# Patient Record
Sex: Male | Born: 1950 | Race: White | Hispanic: No | Marital: Married | Smoking: Former smoker
Health system: Southern US, Community
[De-identification: ages and names within clinical notes are randomized; demographics above are authoritative.]

## PROBLEM LIST (undated history)

## (undated) DIAGNOSIS — E785 Hyperlipidemia, unspecified: Secondary | ICD-10-CM

## (undated) DIAGNOSIS — I639 Cerebral infarction, unspecified: Secondary | ICD-10-CM

## (undated) DIAGNOSIS — N186 End stage renal disease: Secondary | ICD-10-CM

## (undated) DIAGNOSIS — I214 Non-ST elevation (NSTEMI) myocardial infarction: Secondary | ICD-10-CM

## (undated) DIAGNOSIS — I509 Heart failure, unspecified: Secondary | ICD-10-CM

## (undated) DIAGNOSIS — H409 Unspecified glaucoma: Secondary | ICD-10-CM

## (undated) DIAGNOSIS — M2041 Other hammer toe(s) (acquired), right foot: Secondary | ICD-10-CM

## (undated) DIAGNOSIS — E0842 Diabetes mellitus due to underlying condition with diabetic polyneuropathy: Secondary | ICD-10-CM

## (undated) DIAGNOSIS — D649 Anemia, unspecified: Secondary | ICD-10-CM

## (undated) DIAGNOSIS — E781 Pure hyperglyceridemia: Secondary | ICD-10-CM

## (undated) DIAGNOSIS — B351 Tinea unguium: Secondary | ICD-10-CM

## (undated) DIAGNOSIS — M109 Gout, unspecified: Secondary | ICD-10-CM

## (undated) DIAGNOSIS — E119 Type 2 diabetes mellitus without complications: Secondary | ICD-10-CM

## (undated) DIAGNOSIS — Z8679 Personal history of other diseases of the circulatory system: Secondary | ICD-10-CM

## (undated) DIAGNOSIS — M1990 Unspecified osteoarthritis, unspecified site: Secondary | ICD-10-CM

## (undated) DIAGNOSIS — M2042 Other hammer toe(s) (acquired), left foot: Secondary | ICD-10-CM

## (undated) DIAGNOSIS — I110 Hypertensive heart disease with heart failure: Secondary | ICD-10-CM

## (undated) DIAGNOSIS — Z992 Dependence on renal dialysis: Secondary | ICD-10-CM

## (undated) DIAGNOSIS — M545 Low back pain: Secondary | ICD-10-CM

## (undated) DIAGNOSIS — K219 Gastro-esophageal reflux disease without esophagitis: Secondary | ICD-10-CM

## (undated) DIAGNOSIS — R0600 Dyspnea, unspecified: Secondary | ICD-10-CM

## (undated) DIAGNOSIS — I739 Peripheral vascular disease, unspecified: Secondary | ICD-10-CM

## (undated) DIAGNOSIS — G4730 Sleep apnea, unspecified: Secondary | ICD-10-CM

## (undated) DIAGNOSIS — F329 Major depressive disorder, single episode, unspecified: Secondary | ICD-10-CM

## (undated) DIAGNOSIS — I10 Essential (primary) hypertension: Secondary | ICD-10-CM

## (undated) DIAGNOSIS — F411 Generalized anxiety disorder: Secondary | ICD-10-CM

## (undated) DIAGNOSIS — I219 Acute myocardial infarction, unspecified: Secondary | ICD-10-CM

## (undated) DIAGNOSIS — Z9289 Personal history of other medical treatment: Secondary | ICD-10-CM

## (undated) HISTORY — DX: Type 2 diabetes mellitus without complications: E11.9

## (undated) HISTORY — DX: Unspecified osteoarthritis, unspecified site: M19.90

## (undated) HISTORY — DX: Hypertensive heart disease with heart failure: I11.0

## (undated) HISTORY — DX: Other hammer toe(s) (acquired), left foot: M20.42

## (undated) HISTORY — DX: Gout, unspecified: M10.9

## (undated) HISTORY — DX: Cerebral infarction, unspecified: I63.9

## (undated) HISTORY — PX: CATARACT EXTRACTION W/ INTRAOCULAR LENS  IMPLANT, BILATERAL: SHX1307

## (undated) HISTORY — DX: Low back pain: M54.5

## (undated) HISTORY — PX: KNEE SURGERY: SHX244

## (undated) HISTORY — PX: CARDIAC CATHETERIZATION: SHX172

## (undated) HISTORY — PX: CORONARY ANGIOPLASTY: SHX604

## (undated) HISTORY — DX: Anemia, unspecified: D64.9

## (undated) HISTORY — PX: COLONOSCOPY W/ POLYPECTOMY: SHX1380

## (undated) HISTORY — DX: Gastro-esophageal reflux disease without esophagitis: K21.9

## (undated) HISTORY — DX: Tinea unguium: B35.1

## (undated) HISTORY — DX: Heart failure, unspecified: I50.9

## (undated) HISTORY — DX: Diabetes mellitus due to underlying condition with diabetic polyneuropathy: E08.42

## (undated) HISTORY — DX: Acute myocardial infarction, unspecified: I21.9

## (undated) HISTORY — DX: Hyperlipidemia, unspecified: E78.5

## (undated) HISTORY — DX: Peripheral vascular disease, unspecified: I73.9

## (undated) HISTORY — DX: Unspecified glaucoma: H40.9

## (undated) HISTORY — DX: Personal history of other diseases of the circulatory system: Z86.79

## (undated) HISTORY — DX: Other hammer toe(s) (acquired), right foot: M20.41

## (undated) HISTORY — PX: OTHER SURGICAL HISTORY: SHX169

## (undated) HISTORY — DX: Pure hyperglyceridemia: E78.1

## (undated) HISTORY — DX: Essential (primary) hypertension: I10

## (undated) HISTORY — PX: INSERTION OF DIALYSIS CATHETER: SHX1324

## (undated) HISTORY — DX: Major depressive disorder, single episode, unspecified: F32.9

## (undated) HISTORY — PX: CAROTID STENT: SHX1301

## (undated) HISTORY — DX: Non-ST elevation (NSTEMI) myocardial infarction: I21.4

## (undated) HISTORY — DX: Generalized anxiety disorder: F41.1

---

## 1995-04-25 DIAGNOSIS — I219 Acute myocardial infarction, unspecified: Secondary | ICD-10-CM | POA: Insufficient documentation

## 1995-04-25 HISTORY — DX: Acute myocardial infarction, unspecified: I21.9

## 2000-03-28 ENCOUNTER — Ambulatory Visit (HOSPITAL_BASED_OUTPATIENT_CLINIC_OR_DEPARTMENT_OTHER): Admission: RE | Admit: 2000-03-28 | Discharge: 2000-03-28 | Payer: Self-pay | Admitting: Orthopedic Surgery

## 2003-04-21 ENCOUNTER — Ambulatory Visit (HOSPITAL_COMMUNITY): Admission: RE | Admit: 2003-04-21 | Discharge: 2003-04-22 | Payer: Self-pay | Admitting: Cardiology

## 2007-09-12 ENCOUNTER — Encounter: Payer: Self-pay | Admitting: Endocrinology

## 2008-04-28 ENCOUNTER — Encounter: Payer: Self-pay | Admitting: Endocrinology

## 2008-05-16 ENCOUNTER — Encounter: Payer: Self-pay | Admitting: Endocrinology

## 2008-09-15 ENCOUNTER — Ambulatory Visit: Payer: Self-pay | Admitting: Endocrinology

## 2008-09-15 DIAGNOSIS — M545 Low back pain, unspecified: Secondary | ICD-10-CM | POA: Insufficient documentation

## 2008-09-15 DIAGNOSIS — K219 Gastro-esophageal reflux disease without esophagitis: Secondary | ICD-10-CM | POA: Insufficient documentation

## 2008-09-15 DIAGNOSIS — E119 Type 2 diabetes mellitus without complications: Secondary | ICD-10-CM

## 2008-09-15 DIAGNOSIS — I214 Non-ST elevation (NSTEMI) myocardial infarction: Secondary | ICD-10-CM

## 2008-09-15 DIAGNOSIS — E781 Pure hyperglyceridemia: Secondary | ICD-10-CM | POA: Insufficient documentation

## 2008-09-15 DIAGNOSIS — F3289 Other specified depressive episodes: Secondary | ICD-10-CM

## 2008-09-15 DIAGNOSIS — F329 Major depressive disorder, single episode, unspecified: Secondary | ICD-10-CM

## 2008-09-15 DIAGNOSIS — F411 Generalized anxiety disorder: Secondary | ICD-10-CM | POA: Insufficient documentation

## 2008-09-15 HISTORY — DX: Non-ST elevation (NSTEMI) myocardial infarction: I21.4

## 2008-09-15 HISTORY — DX: Pure hyperglyceridemia: E78.1

## 2008-09-15 HISTORY — DX: Major depressive disorder, single episode, unspecified: F32.9

## 2008-09-15 HISTORY — DX: Generalized anxiety disorder: F41.1

## 2008-09-15 HISTORY — DX: Type 2 diabetes mellitus without complications: E11.9

## 2008-09-15 HISTORY — DX: Low back pain, unspecified: M54.50

## 2008-09-15 HISTORY — DX: Gastro-esophageal reflux disease without esophagitis: K21.9

## 2008-09-15 HISTORY — DX: Other specified depressive episodes: F32.89

## 2010-09-09 NOTE — Cardiovascular Report (Signed)
NAME:  James Chan, James Chan NO.:  [id number]   MEDICAL RECORD NO.:  [id number]                   PATIENT TYPE:  OIB   LOCATION:  2899                                 FACILITY:  [facility]   PHYSICIAN:  Eustace Quail, M.D.                  DATE OF BIRTH:  1951-04-02   DATE OF PROCEDURE:  DATE OF DISCHARGE:                              CARDIAC CATHETERIZATION   CLINICAL HISTORY:  James Chan is 60 years old and had a previous stent  placed in the mid right coronary artery in 1997.  He has also had stenting  of the left iliac vessel.  He has done well until the last couple of months  when he has developed exertional chest tightness with almost any kind of  exertion.  He was seen in consultation by Dr. Geraldo Pitter who arranged for  evaluation with coronary angiography.   PROCEDURE:  The procedure was performed via the right femoral artery using  arterial sheath and 6-French preformed coronary catheters.  A front wall  arterial puncture was performed and Omnipaque contrast was used.  After  completion of the diagnostic study made a decision to proceed with  intervention on the right coronary artery and circumflex artery.   The patient was enrolled in the SEPIA trial which randomizes him to either  unfractionated heparin or a 10A inhibitor.  This is blinded.  We chose to  use Integrilin double bolus and infusion and he was already on Plavix.   We used a 6-French JR4 guiding catheter with side holes and a short PT2  light support wire.  We crossed the lesions in the proximal and mid right  coronary artery with the wire without difficulty.  We pre dilated with a 2.5  x 15 mm Quantum Maverick performing one inflation of 8 atmospheres for 30  seconds at each lesion.  We then stented the distal lesion with a 2.5 x 20  mm Taxus stent deploying this with one inflation of 12 atmospheres for 30  seconds.  We then deployed a 3.0 x 12 mm Taxus at the proximal lesion  deploying  this with one inflation of 16 atmospheres for 20 seconds.  We then  post dilated with a 3.25 x 8 mm Quantum Maverick and inflated this with one  inflation up to 16 atmospheres for 20 seconds.   We then approached the circumflex artery.  We used a CLS4 6-French guiding  catheter with side holes and the same PT2 light support wire.  We crossed  the lesion in the distal circumflex artery with the wire without difficulty.  We pre dilated with a 2.5 x 15 mm Quantum Maverick performing one inflation  up to 8 atmospheres for 30 seconds.  We then deployed a 2.5 x 16 mm Taxus  stent deploying this with one inflation of 10 atmospheres for 30 seconds so  as not to over dilate the  distal edge.  We then post dilated with a 2.5 x 15  mm Quantum Maverick inflating to 16 atmospheres for 30 seconds and avoiding  the distal edge.  Repeat diagnostic studies were then performed through the  guiding catheter.  The patient tolerated the procedure well and left the  laboratory in satisfactory condition.   RESULTS:  The aortic pressure was 141/81 with mean of 105.  Left ventricular  pressure was 141/5.   Left main coronary artery:  Free of significant disease.   Left anterior descending artery:  Gave rise to a large diagonal branch and  two septal perforators.  The LAD was irregular with no major obstruction.   Circumflex artery:  Gave rise to a marginal branch and a posterolateral  branch.  There was 80% narrowing in the distal vessel which was segmental.   Right coronary artery:  Moderate sized vessel.  Gave rise to a right  ventricular branch, posterior descending branch, and two posterolateral  branches.  There was 90% narrowing in the proximal vessel.  There was less  than 20% narrowing at the stent in the mid vessel.  There was a segmental  80% narrowing in the mid vessel after the stent.  Distal vessel was free of  major obstruction.   LEFT VENTRICULOGRAM:  The left ventriculogram performed in the  RAO  projection showed good wall motion with no areas of hypokinesis.  The  estimated ejection fraction was 60%.   Following stenting of the lesion in the proximal right coronary artery the  stenosis improved from 90% to 0%.   Following stenting of the lesion in the mid right coronary artery stenosis  improved from 80% to 0%.   Following stenting of the lesion in the distal circumflex artery the  stenosis improved from 80% to 0%.   CONCLUSIONS:  1. Coronary artery disease status post prior stenting in the mid right     coronary artery in 1997 with 90% stenosis in the proximal right coronary     artery, less than 20% narrowing at the stent site in the mid right     coronary artery, 80% narrowing in the mid right coronary artery distal to     the stent, 80% narrowing in the distal circumflex artery, irregularities     in the left anterior descending artery, and normal left ventricular     function.  2. Successful stenting of tandem lesions in the proximal and mid right     coronary artery using Taxus stents with improvement in proximal stenosis     of 90% to 0% and improvement in mid stenosis from 80% to 0%.  3. Successful stenting of the lesion in the distal circumflex artery with a     Taxus stent with improvement in center of narrowing from 80% to 0%.   DISPOSITION:  The patient will return to postanesthesia care unit for  further observation.  Since we used Taxus stents Plavix will be mandated for  six months and ideally continued for a year.  The patient has been on Plavix  therapy, however.                                               Eustace Quail, M.D.    BB/MEDQ  D:  04/21/2003  T:  04/21/2003  Job:  [id number]   cc:   Truman Hayward, M.D.  [place]   [facility] Lab

## 2010-09-09 NOTE — Discharge Summary (Signed)
NAME:  James Chan, James Chan NO.:  [id number]   MEDICAL RECORD NO.:  [id number]                   PATIENT TYPE:  OIB   LOCATION:  6529                                 FACILITY:  [facility]   PHYSICIAN:  Eustace Quail, M.D.                  DATE OF BIRTH:  [birth year]   DATE OF ADMISSION:  04/21/2003  DATE OF DISCHARGE:  04/22/2003                           DISCHARGE SUMMARY - REFERRING   PROCEDURE:  Coronary artery stenting December 28.   REASON FOR ADMISSION:  James Chan is a 60 year old male with known history  of coronary artery disease status post previous inferior MI in June 1997,  initially treated with front load TPA and subsequent stenting of the RCA,  with multiple cardiac risk factors including peripheral vascular disease  with previous stent of left common iliac artery in February 2001, who  recently presented for evaluation of progressive exertional chest tightness  over the past several months. Most recent adenosine Cardiolite in August of  this year revealed no perfusion abnormalities with ejection fraction of 47%;  however, the patient continued to have recurrent exertional chest discomfort  with relief of nitroglycerin. He was thus referred for diagnostic coronary  angiography.   LABORATORY DATA:  CBC:  Normal at discharge. Sodium 133, potassium 3.8,  glucose 222, BUN 11, creatinine 1.0 at discharge. Serial cardiac markers  normal.   HOSPITAL COURSE:  The patient underwent same-day diagnostic coronary  angiography by Dr. Eustace Quail (see report for full details), revealing  severe two-vessel coronary artery disease with normal left ventricular  function.   Specifically, there was a 90% proximal and 80% mid RCA disease with 80%  distal CFX. There was less than 20% in-stent restenosis of the mid RCA.   Dr. Olevia Perches proceeded with successful stenting (Taxus) of the two RCA and the  one CFX lesions, all to __________ residual stenosis. There were  no known  complications.   The patient was kept for overnight observation and cleared for discharge the  following morning in hemodynamically stable condition.   MEDICATIONS ADJUSTMENTS THIS ADMISSION:  Addition of Plavix for at least six  months.   DISCHARGE MEDICATIONS:  1. Plavix 75 mg q.d. (at least six months).  2. Coated aspirin 325 mg q.d.  3. Toprol-XL 100 mg q.d.  4. Lipitor 20 mg q.d.  5. Glucovance 2.5/500 mg q.d.  6. Nexium 40 mg q.d.  7. Lotrel 10/20 mg q.d.  8. Zetia 10 mg q.d.  9. Tricor 160 mg q.d.  10.      Nitrostat 0.4 mg p.r.n.   INSTRUCTIONS:  No heavy lifting/driving for two days. Maintain low  salt/cholesterol diet. Call the office if there is any swelling or bleeding  in the groin.   The patient is instructed to arrange followup with Dr. Geraldo Pitter in one week.   DISCHARGE DIAGNOSES:  1. Coronary artery disease progression.     a.  Status post stent (Taxus) right coronary artery (x2) and circumflex        December 28.        A. Normal left ventricular function.        B. Patent mid right coronary artery stent.        C. Status post acute inferior myocardial infarction/TPA/stent right           coronary artery 1997.  2. Peripheral vascular disease. Status post stent left common iliac 2001.  3. Hypertension.  4. Type 2 diabetes mellitus.  5. Dyslipidemia.      Gene Serpe, P.A. [facility]                      Eustace Quail, M.D.    GS/MEDQ  D:  04/22/2003  T:  04/22/2003  Job:  [id number]   cc:   Reita Cliche. Revankar, M.D.  [place]  [place] [address]  Fax: [place]  [place] [address]  Fax: [phone]

## 2014-04-29 DIAGNOSIS — F411 Generalized anxiety disorder: Secondary | ICD-10-CM | POA: Diagnosis not present

## 2014-04-29 DIAGNOSIS — F329 Major depressive disorder, single episode, unspecified: Secondary | ICD-10-CM | POA: Diagnosis not present

## 2014-04-29 DIAGNOSIS — E119 Type 2 diabetes mellitus without complications: Secondary | ICD-10-CM | POA: Diagnosis not present

## 2014-04-29 DIAGNOSIS — G4700 Insomnia, unspecified: Secondary | ICD-10-CM | POA: Diagnosis not present

## 2014-04-29 DIAGNOSIS — I2510 Atherosclerotic heart disease of native coronary artery without angina pectoris: Secondary | ICD-10-CM | POA: Diagnosis not present

## 2014-04-29 DIAGNOSIS — E781 Pure hyperglyceridemia: Secondary | ICD-10-CM | POA: Diagnosis not present

## 2014-04-29 DIAGNOSIS — I10 Essential (primary) hypertension: Secondary | ICD-10-CM | POA: Diagnosis not present

## 2014-04-29 DIAGNOSIS — E780 Pure hypercholesterolemia: Secondary | ICD-10-CM | POA: Diagnosis not present

## 2014-05-27 DIAGNOSIS — E119 Type 2 diabetes mellitus without complications: Secondary | ICD-10-CM | POA: Diagnosis not present

## 2014-05-27 DIAGNOSIS — F329 Major depressive disorder, single episode, unspecified: Secondary | ICD-10-CM | POA: Diagnosis not present

## 2014-05-27 DIAGNOSIS — F411 Generalized anxiety disorder: Secondary | ICD-10-CM | POA: Diagnosis not present

## 2014-05-27 DIAGNOSIS — E781 Pure hyperglyceridemia: Secondary | ICD-10-CM | POA: Diagnosis not present

## 2014-05-27 DIAGNOSIS — E780 Pure hypercholesterolemia: Secondary | ICD-10-CM | POA: Diagnosis not present

## 2014-05-27 DIAGNOSIS — I2510 Atherosclerotic heart disease of native coronary artery without angina pectoris: Secondary | ICD-10-CM | POA: Diagnosis not present

## 2014-05-27 DIAGNOSIS — G4700 Insomnia, unspecified: Secondary | ICD-10-CM | POA: Diagnosis not present

## 2014-05-27 DIAGNOSIS — I10 Essential (primary) hypertension: Secondary | ICD-10-CM | POA: Diagnosis not present

## 2014-06-04 DIAGNOSIS — E785 Hyperlipidemia, unspecified: Secondary | ICD-10-CM | POA: Diagnosis not present

## 2014-06-04 DIAGNOSIS — E1159 Type 2 diabetes mellitus with other circulatory complications: Secondary | ICD-10-CM | POA: Diagnosis not present

## 2014-06-04 DIAGNOSIS — I70213 Atherosclerosis of native arteries of extremities with intermittent claudication, bilateral legs: Secondary | ICD-10-CM | POA: Diagnosis not present

## 2014-06-04 DIAGNOSIS — I714 Abdominal aortic aneurysm, without rupture: Secondary | ICD-10-CM | POA: Diagnosis not present

## 2014-06-04 DIAGNOSIS — I10 Essential (primary) hypertension: Secondary | ICD-10-CM | POA: Diagnosis not present

## 2014-06-04 DIAGNOSIS — I6523 Occlusion and stenosis of bilateral carotid arteries: Secondary | ICD-10-CM | POA: Diagnosis not present

## 2014-06-04 DIAGNOSIS — Z95820 Peripheral vascular angioplasty status with implants and grafts: Secondary | ICD-10-CM | POA: Diagnosis not present

## 2014-06-08 DIAGNOSIS — E780 Pure hypercholesterolemia: Secondary | ICD-10-CM | POA: Diagnosis not present

## 2014-06-08 DIAGNOSIS — I2510 Atherosclerotic heart disease of native coronary artery without angina pectoris: Secondary | ICD-10-CM | POA: Diagnosis not present

## 2014-06-08 DIAGNOSIS — F329 Major depressive disorder, single episode, unspecified: Secondary | ICD-10-CM | POA: Diagnosis not present

## 2014-06-08 DIAGNOSIS — E781 Pure hyperglyceridemia: Secondary | ICD-10-CM | POA: Diagnosis not present

## 2014-06-08 DIAGNOSIS — F411 Generalized anxiety disorder: Secondary | ICD-10-CM | POA: Diagnosis not present

## 2014-06-08 DIAGNOSIS — G4700 Insomnia, unspecified: Secondary | ICD-10-CM | POA: Diagnosis not present

## 2014-06-08 DIAGNOSIS — I10 Essential (primary) hypertension: Secondary | ICD-10-CM | POA: Diagnosis not present

## 2014-06-08 DIAGNOSIS — E119 Type 2 diabetes mellitus without complications: Secondary | ICD-10-CM | POA: Diagnosis not present

## 2014-06-24 DIAGNOSIS — G4700 Insomnia, unspecified: Secondary | ICD-10-CM | POA: Diagnosis not present

## 2014-06-24 DIAGNOSIS — E119 Type 2 diabetes mellitus without complications: Secondary | ICD-10-CM | POA: Diagnosis not present

## 2014-06-24 DIAGNOSIS — I10 Essential (primary) hypertension: Secondary | ICD-10-CM | POA: Diagnosis not present

## 2014-06-24 DIAGNOSIS — E781 Pure hyperglyceridemia: Secondary | ICD-10-CM | POA: Diagnosis not present

## 2014-06-24 DIAGNOSIS — R809 Proteinuria, unspecified: Secondary | ICD-10-CM | POA: Diagnosis not present

## 2014-06-24 DIAGNOSIS — E780 Pure hypercholesterolemia: Secondary | ICD-10-CM | POA: Diagnosis not present

## 2014-06-24 DIAGNOSIS — F411 Generalized anxiety disorder: Secondary | ICD-10-CM | POA: Diagnosis not present

## 2014-06-24 DIAGNOSIS — F329 Major depressive disorder, single episode, unspecified: Secondary | ICD-10-CM | POA: Diagnosis not present

## 2014-07-17 DIAGNOSIS — E780 Pure hypercholesterolemia: Secondary | ICD-10-CM | POA: Diagnosis not present

## 2014-07-17 DIAGNOSIS — I6523 Occlusion and stenosis of bilateral carotid arteries: Secondary | ICD-10-CM | POA: Diagnosis not present

## 2014-07-17 DIAGNOSIS — I25799 Atherosclerosis of other coronary artery bypass graft(s) with unspecified angina pectoris: Secondary | ICD-10-CM | POA: Diagnosis not present

## 2014-07-17 DIAGNOSIS — R0602 Shortness of breath: Secondary | ICD-10-CM | POA: Diagnosis not present

## 2014-07-17 DIAGNOSIS — Z8673 Personal history of transient ischemic attack (TIA), and cerebral infarction without residual deficits: Secondary | ICD-10-CM | POA: Diagnosis not present

## 2014-07-17 DIAGNOSIS — R140 Abdominal distension (gaseous): Secondary | ICD-10-CM | POA: Diagnosis not present

## 2014-07-17 DIAGNOSIS — I700 Atherosclerosis of aorta: Secondary | ICD-10-CM | POA: Diagnosis not present

## 2014-07-17 DIAGNOSIS — R600 Localized edema: Secondary | ICD-10-CM | POA: Diagnosis not present

## 2014-07-17 DIAGNOSIS — I70203 Unspecified atherosclerosis of native arteries of extremities, bilateral legs: Secondary | ICD-10-CM | POA: Diagnosis not present

## 2014-07-22 DIAGNOSIS — F329 Major depressive disorder, single episode, unspecified: Secondary | ICD-10-CM | POA: Diagnosis not present

## 2014-07-22 DIAGNOSIS — L237 Allergic contact dermatitis due to plants, except food: Secondary | ICD-10-CM | POA: Diagnosis not present

## 2014-07-22 DIAGNOSIS — E780 Pure hypercholesterolemia: Secondary | ICD-10-CM | POA: Diagnosis not present

## 2014-07-22 DIAGNOSIS — E781 Pure hyperglyceridemia: Secondary | ICD-10-CM | POA: Diagnosis not present

## 2014-07-22 DIAGNOSIS — I10 Essential (primary) hypertension: Secondary | ICD-10-CM | POA: Diagnosis not present

## 2014-07-22 DIAGNOSIS — E119 Type 2 diabetes mellitus without complications: Secondary | ICD-10-CM | POA: Diagnosis not present

## 2014-07-22 DIAGNOSIS — G4700 Insomnia, unspecified: Secondary | ICD-10-CM | POA: Diagnosis not present

## 2014-07-22 DIAGNOSIS — F411 Generalized anxiety disorder: Secondary | ICD-10-CM | POA: Diagnosis not present

## 2014-08-19 DIAGNOSIS — R809 Proteinuria, unspecified: Secondary | ICD-10-CM | POA: Diagnosis not present

## 2014-08-19 DIAGNOSIS — I10 Essential (primary) hypertension: Secondary | ICD-10-CM | POA: Diagnosis not present

## 2014-08-19 DIAGNOSIS — E780 Pure hypercholesterolemia: Secondary | ICD-10-CM | POA: Diagnosis not present

## 2014-08-19 DIAGNOSIS — F329 Major depressive disorder, single episode, unspecified: Secondary | ICD-10-CM | POA: Diagnosis not present

## 2014-08-19 DIAGNOSIS — G4700 Insomnia, unspecified: Secondary | ICD-10-CM | POA: Diagnosis not present

## 2014-08-19 DIAGNOSIS — E119 Type 2 diabetes mellitus without complications: Secondary | ICD-10-CM | POA: Diagnosis not present

## 2014-08-19 DIAGNOSIS — E781 Pure hyperglyceridemia: Secondary | ICD-10-CM | POA: Diagnosis not present

## 2014-08-19 DIAGNOSIS — F411 Generalized anxiety disorder: Secondary | ICD-10-CM | POA: Diagnosis not present

## 2014-09-07 DIAGNOSIS — R0602 Shortness of breath: Secondary | ICD-10-CM | POA: Diagnosis not present

## 2014-09-07 DIAGNOSIS — I2510 Atherosclerotic heart disease of native coronary artery without angina pectoris: Secondary | ICD-10-CM | POA: Diagnosis not present

## 2014-09-07 DIAGNOSIS — I25119 Atherosclerotic heart disease of native coronary artery with unspecified angina pectoris: Secondary | ICD-10-CM | POA: Diagnosis not present

## 2014-09-07 DIAGNOSIS — I25799 Atherosclerosis of other coronary artery bypass graft(s) with unspecified angina pectoris: Secondary | ICD-10-CM | POA: Diagnosis not present

## 2014-09-07 DIAGNOSIS — I517 Cardiomegaly: Secondary | ICD-10-CM | POA: Diagnosis not present

## 2014-09-07 DIAGNOSIS — I519 Heart disease, unspecified: Secondary | ICD-10-CM | POA: Diagnosis not present

## 2014-09-07 DIAGNOSIS — E780 Pure hypercholesterolemia: Secondary | ICD-10-CM | POA: Diagnosis not present

## 2014-09-07 DIAGNOSIS — I739 Peripheral vascular disease, unspecified: Secondary | ICD-10-CM | POA: Diagnosis not present

## 2014-09-14 DIAGNOSIS — R079 Chest pain, unspecified: Secondary | ICD-10-CM | POA: Diagnosis not present

## 2014-09-14 DIAGNOSIS — E119 Type 2 diabetes mellitus without complications: Secondary | ICD-10-CM | POA: Diagnosis not present

## 2014-09-14 DIAGNOSIS — E785 Hyperlipidemia, unspecified: Secondary | ICD-10-CM | POA: Diagnosis not present

## 2014-09-14 DIAGNOSIS — I10 Essential (primary) hypertension: Secondary | ICD-10-CM | POA: Diagnosis not present

## 2014-09-14 DIAGNOSIS — I2510 Atherosclerotic heart disease of native coronary artery without angina pectoris: Secondary | ICD-10-CM | POA: Diagnosis not present

## 2014-09-14 DIAGNOSIS — I739 Peripheral vascular disease, unspecified: Secondary | ICD-10-CM | POA: Diagnosis not present

## 2014-09-16 DIAGNOSIS — E781 Pure hyperglyceridemia: Secondary | ICD-10-CM | POA: Diagnosis not present

## 2014-09-16 DIAGNOSIS — E780 Pure hypercholesterolemia: Secondary | ICD-10-CM | POA: Diagnosis not present

## 2014-09-16 DIAGNOSIS — R809 Proteinuria, unspecified: Secondary | ICD-10-CM | POA: Diagnosis not present

## 2014-09-16 DIAGNOSIS — I10 Essential (primary) hypertension: Secondary | ICD-10-CM | POA: Diagnosis not present

## 2014-09-16 DIAGNOSIS — F329 Major depressive disorder, single episode, unspecified: Secondary | ICD-10-CM | POA: Diagnosis not present

## 2014-09-16 DIAGNOSIS — E119 Type 2 diabetes mellitus without complications: Secondary | ICD-10-CM | POA: Diagnosis not present

## 2014-09-16 DIAGNOSIS — F411 Generalized anxiety disorder: Secondary | ICD-10-CM | POA: Diagnosis not present

## 2014-09-16 DIAGNOSIS — G4700 Insomnia, unspecified: Secondary | ICD-10-CM | POA: Diagnosis not present

## 2014-09-18 DIAGNOSIS — E785 Hyperlipidemia, unspecified: Secondary | ICD-10-CM | POA: Diagnosis not present

## 2014-09-18 DIAGNOSIS — E119 Type 2 diabetes mellitus without complications: Secondary | ICD-10-CM | POA: Diagnosis not present

## 2014-09-18 DIAGNOSIS — I10 Essential (primary) hypertension: Secondary | ICD-10-CM | POA: Diagnosis not present

## 2014-09-18 DIAGNOSIS — I2510 Atherosclerotic heart disease of native coronary artery without angina pectoris: Secondary | ICD-10-CM | POA: Diagnosis not present

## 2014-09-18 DIAGNOSIS — Z79899 Other long term (current) drug therapy: Secondary | ICD-10-CM | POA: Diagnosis not present

## 2014-09-29 DIAGNOSIS — R9439 Abnormal result of other cardiovascular function study: Secondary | ICD-10-CM | POA: Diagnosis not present

## 2014-09-29 DIAGNOSIS — R202 Paresthesia of skin: Secondary | ICD-10-CM | POA: Diagnosis not present

## 2014-09-29 DIAGNOSIS — E119 Type 2 diabetes mellitus without complications: Secondary | ICD-10-CM | POA: Diagnosis not present

## 2014-09-29 DIAGNOSIS — I2510 Atherosclerotic heart disease of native coronary artery without angina pectoris: Secondary | ICD-10-CM | POA: Diagnosis not present

## 2014-09-29 DIAGNOSIS — R001 Bradycardia, unspecified: Secondary | ICD-10-CM | POA: Diagnosis not present

## 2014-09-29 DIAGNOSIS — I739 Peripheral vascular disease, unspecified: Secondary | ICD-10-CM | POA: Diagnosis not present

## 2014-09-29 DIAGNOSIS — R600 Localized edema: Secondary | ICD-10-CM | POA: Diagnosis not present

## 2014-09-29 DIAGNOSIS — I25118 Atherosclerotic heart disease of native coronary artery with other forms of angina pectoris: Secondary | ICD-10-CM | POA: Diagnosis not present

## 2014-09-29 DIAGNOSIS — Z8673 Personal history of transient ischemic attack (TIA), and cerebral infarction without residual deficits: Secondary | ICD-10-CM | POA: Diagnosis not present

## 2014-09-29 DIAGNOSIS — I10 Essential (primary) hypertension: Secondary | ICD-10-CM | POA: Diagnosis not present

## 2014-09-29 DIAGNOSIS — E780 Pure hypercholesterolemia: Secondary | ICD-10-CM | POA: Diagnosis not present

## 2014-09-30 DIAGNOSIS — R9439 Abnormal result of other cardiovascular function study: Secondary | ICD-10-CM | POA: Diagnosis not present

## 2014-09-30 DIAGNOSIS — I2510 Atherosclerotic heart disease of native coronary artery without angina pectoris: Secondary | ICD-10-CM | POA: Diagnosis not present

## 2014-09-30 DIAGNOSIS — E780 Pure hypercholesterolemia: Secondary | ICD-10-CM | POA: Diagnosis not present

## 2014-09-30 DIAGNOSIS — Z8673 Personal history of transient ischemic attack (TIA), and cerebral infarction without residual deficits: Secondary | ICD-10-CM | POA: Diagnosis not present

## 2014-09-30 DIAGNOSIS — R600 Localized edema: Secondary | ICD-10-CM | POA: Diagnosis not present

## 2014-09-30 DIAGNOSIS — R202 Paresthesia of skin: Secondary | ICD-10-CM | POA: Diagnosis not present

## 2014-09-30 DIAGNOSIS — I739 Peripheral vascular disease, unspecified: Secondary | ICD-10-CM | POA: Diagnosis not present

## 2014-09-30 DIAGNOSIS — E119 Type 2 diabetes mellitus without complications: Secondary | ICD-10-CM | POA: Diagnosis not present

## 2014-09-30 DIAGNOSIS — I25118 Atherosclerotic heart disease of native coronary artery with other forms of angina pectoris: Secondary | ICD-10-CM | POA: Diagnosis not present

## 2014-09-30 DIAGNOSIS — I10 Essential (primary) hypertension: Secondary | ICD-10-CM | POA: Diagnosis not present

## 2014-09-30 DIAGNOSIS — R001 Bradycardia, unspecified: Secondary | ICD-10-CM | POA: Diagnosis not present

## 2014-10-14 DIAGNOSIS — K219 Gastro-esophageal reflux disease without esophagitis: Secondary | ICD-10-CM | POA: Diagnosis not present

## 2014-10-14 DIAGNOSIS — G4700 Insomnia, unspecified: Secondary | ICD-10-CM | POA: Diagnosis not present

## 2014-10-14 DIAGNOSIS — R609 Edema, unspecified: Secondary | ICD-10-CM | POA: Diagnosis not present

## 2014-10-14 DIAGNOSIS — E781 Pure hyperglyceridemia: Secondary | ICD-10-CM | POA: Diagnosis not present

## 2014-10-14 DIAGNOSIS — F329 Major depressive disorder, single episode, unspecified: Secondary | ICD-10-CM | POA: Diagnosis not present

## 2014-10-14 DIAGNOSIS — M545 Low back pain: Secondary | ICD-10-CM | POA: Diagnosis not present

## 2014-10-14 DIAGNOSIS — F411 Generalized anxiety disorder: Secondary | ICD-10-CM | POA: Diagnosis not present

## 2014-10-14 DIAGNOSIS — R809 Proteinuria, unspecified: Secondary | ICD-10-CM | POA: Diagnosis not present

## 2014-10-30 DIAGNOSIS — K219 Gastro-esophageal reflux disease without esophagitis: Secondary | ICD-10-CM | POA: Diagnosis not present

## 2014-10-30 DIAGNOSIS — I10 Essential (primary) hypertension: Secondary | ICD-10-CM | POA: Diagnosis not present

## 2014-10-30 DIAGNOSIS — Z8679 Personal history of other diseases of the circulatory system: Secondary | ICD-10-CM

## 2014-10-30 DIAGNOSIS — Z87891 Personal history of nicotine dependence: Secondary | ICD-10-CM | POA: Diagnosis not present

## 2014-10-30 DIAGNOSIS — I739 Peripheral vascular disease, unspecified: Secondary | ICD-10-CM

## 2014-10-30 DIAGNOSIS — E11319 Type 2 diabetes mellitus with unspecified diabetic retinopathy without macular edema: Secondary | ICD-10-CM | POA: Diagnosis not present

## 2014-10-30 DIAGNOSIS — I2510 Atherosclerotic heart disease of native coronary artery without angina pectoris: Secondary | ICD-10-CM | POA: Diagnosis not present

## 2014-10-30 DIAGNOSIS — R0602 Shortness of breath: Secondary | ICD-10-CM | POA: Diagnosis not present

## 2014-10-30 DIAGNOSIS — G4700 Insomnia, unspecified: Secondary | ICD-10-CM | POA: Diagnosis not present

## 2014-10-30 DIAGNOSIS — H409 Unspecified glaucoma: Secondary | ICD-10-CM | POA: Diagnosis not present

## 2014-10-30 HISTORY — DX: Personal history of other diseases of the circulatory system: Z86.79

## 2014-10-30 HISTORY — DX: Peripheral vascular disease, unspecified: I73.9

## 2014-11-11 DIAGNOSIS — F411 Generalized anxiety disorder: Secondary | ICD-10-CM | POA: Diagnosis not present

## 2014-11-11 DIAGNOSIS — F329 Major depressive disorder, single episode, unspecified: Secondary | ICD-10-CM | POA: Diagnosis not present

## 2014-11-11 DIAGNOSIS — M545 Low back pain: Secondary | ICD-10-CM | POA: Diagnosis not present

## 2014-11-11 DIAGNOSIS — R809 Proteinuria, unspecified: Secondary | ICD-10-CM | POA: Diagnosis not present

## 2014-11-11 DIAGNOSIS — E1140 Type 2 diabetes mellitus with diabetic neuropathy, unspecified: Secondary | ICD-10-CM | POA: Diagnosis not present

## 2014-11-11 DIAGNOSIS — E781 Pure hyperglyceridemia: Secondary | ICD-10-CM | POA: Diagnosis not present

## 2014-11-11 DIAGNOSIS — E119 Type 2 diabetes mellitus without complications: Secondary | ICD-10-CM | POA: Diagnosis not present

## 2014-11-11 DIAGNOSIS — G4700 Insomnia, unspecified: Secondary | ICD-10-CM | POA: Diagnosis not present

## 2014-12-09 DIAGNOSIS — R809 Proteinuria, unspecified: Secondary | ICD-10-CM | POA: Diagnosis not present

## 2014-12-09 DIAGNOSIS — E781 Pure hyperglyceridemia: Secondary | ICD-10-CM | POA: Diagnosis not present

## 2014-12-09 DIAGNOSIS — F411 Generalized anxiety disorder: Secondary | ICD-10-CM | POA: Diagnosis not present

## 2014-12-09 DIAGNOSIS — F329 Major depressive disorder, single episode, unspecified: Secondary | ICD-10-CM | POA: Diagnosis not present

## 2014-12-09 DIAGNOSIS — M545 Low back pain: Secondary | ICD-10-CM | POA: Diagnosis not present

## 2014-12-09 DIAGNOSIS — R609 Edema, unspecified: Secondary | ICD-10-CM | POA: Diagnosis not present

## 2014-12-09 DIAGNOSIS — G4700 Insomnia, unspecified: Secondary | ICD-10-CM | POA: Diagnosis not present

## 2014-12-09 DIAGNOSIS — K219 Gastro-esophageal reflux disease without esophagitis: Secondary | ICD-10-CM | POA: Diagnosis not present

## 2014-12-15 DIAGNOSIS — H4011X1 Primary open-angle glaucoma, mild stage: Secondary | ICD-10-CM | POA: Diagnosis not present

## 2014-12-15 DIAGNOSIS — E11329 Type 2 diabetes mellitus with mild nonproliferative diabetic retinopathy without macular edema: Secondary | ICD-10-CM | POA: Diagnosis not present

## 2014-12-15 DIAGNOSIS — H2513 Age-related nuclear cataract, bilateral: Secondary | ICD-10-CM | POA: Diagnosis not present

## 2015-01-01 DIAGNOSIS — I2510 Atherosclerotic heart disease of native coronary artery without angina pectoris: Secondary | ICD-10-CM | POA: Diagnosis not present

## 2015-01-01 DIAGNOSIS — E782 Mixed hyperlipidemia: Secondary | ICD-10-CM | POA: Diagnosis not present

## 2015-01-01 DIAGNOSIS — I25119 Atherosclerotic heart disease of native coronary artery with unspecified angina pectoris: Secondary | ICD-10-CM | POA: Diagnosis not present

## 2015-01-06 DIAGNOSIS — E780 Pure hypercholesterolemia: Secondary | ICD-10-CM | POA: Diagnosis not present

## 2015-01-06 DIAGNOSIS — E119 Type 2 diabetes mellitus without complications: Secondary | ICD-10-CM | POA: Diagnosis not present

## 2015-01-06 DIAGNOSIS — E11319 Type 2 diabetes mellitus with unspecified diabetic retinopathy without macular edema: Secondary | ICD-10-CM | POA: Diagnosis not present

## 2015-01-06 DIAGNOSIS — E781 Pure hyperglyceridemia: Secondary | ICD-10-CM | POA: Diagnosis not present

## 2015-01-06 DIAGNOSIS — R809 Proteinuria, unspecified: Secondary | ICD-10-CM | POA: Diagnosis not present

## 2015-01-06 DIAGNOSIS — F411 Generalized anxiety disorder: Secondary | ICD-10-CM | POA: Diagnosis not present

## 2015-01-06 DIAGNOSIS — I10 Essential (primary) hypertension: Secondary | ICD-10-CM | POA: Diagnosis not present

## 2015-01-06 DIAGNOSIS — F329 Major depressive disorder, single episode, unspecified: Secondary | ICD-10-CM | POA: Diagnosis not present

## 2015-01-06 DIAGNOSIS — M545 Low back pain: Secondary | ICD-10-CM | POA: Diagnosis not present

## 2015-01-06 DIAGNOSIS — G4700 Insomnia, unspecified: Secondary | ICD-10-CM | POA: Diagnosis not present

## 2015-01-06 DIAGNOSIS — R609 Edema, unspecified: Secondary | ICD-10-CM | POA: Diagnosis not present

## 2015-01-27 DIAGNOSIS — I739 Peripheral vascular disease, unspecified: Secondary | ICD-10-CM | POA: Diagnosis not present

## 2015-01-27 DIAGNOSIS — I10 Essential (primary) hypertension: Secondary | ICD-10-CM | POA: Diagnosis not present

## 2015-01-27 DIAGNOSIS — Z955 Presence of coronary angioplasty implant and graft: Secondary | ICD-10-CM | POA: Diagnosis not present

## 2015-01-27 DIAGNOSIS — E109 Type 1 diabetes mellitus without complications: Secondary | ICD-10-CM | POA: Diagnosis not present

## 2015-01-27 DIAGNOSIS — Z8679 Personal history of other diseases of the circulatory system: Secondary | ICD-10-CM | POA: Diagnosis not present

## 2015-01-27 DIAGNOSIS — E785 Hyperlipidemia, unspecified: Secondary | ICD-10-CM | POA: Diagnosis not present

## 2015-01-28 DIAGNOSIS — E113591 Type 2 diabetes mellitus with proliferative diabetic retinopathy without macular edema, right eye: Secondary | ICD-10-CM | POA: Diagnosis not present

## 2015-01-28 DIAGNOSIS — E113492 Type 2 diabetes mellitus with severe nonproliferative diabetic retinopathy without macular edema, left eye: Secondary | ICD-10-CM | POA: Diagnosis not present

## 2015-02-03 DIAGNOSIS — E781 Pure hyperglyceridemia: Secondary | ICD-10-CM | POA: Diagnosis not present

## 2015-02-03 DIAGNOSIS — F411 Generalized anxiety disorder: Secondary | ICD-10-CM | POA: Diagnosis not present

## 2015-02-03 DIAGNOSIS — M545 Low back pain: Secondary | ICD-10-CM | POA: Diagnosis not present

## 2015-02-03 DIAGNOSIS — R609 Edema, unspecified: Secondary | ICD-10-CM | POA: Diagnosis not present

## 2015-02-03 DIAGNOSIS — R809 Proteinuria, unspecified: Secondary | ICD-10-CM | POA: Diagnosis not present

## 2015-02-03 DIAGNOSIS — Z23 Encounter for immunization: Secondary | ICD-10-CM | POA: Diagnosis not present

## 2015-02-03 DIAGNOSIS — G4700 Insomnia, unspecified: Secondary | ICD-10-CM | POA: Diagnosis not present

## 2015-02-03 DIAGNOSIS — F329 Major depressive disorder, single episode, unspecified: Secondary | ICD-10-CM | POA: Diagnosis not present

## 2015-02-12 DIAGNOSIS — E782 Mixed hyperlipidemia: Secondary | ICD-10-CM | POA: Diagnosis not present

## 2015-02-12 DIAGNOSIS — I2510 Atherosclerotic heart disease of native coronary artery without angina pectoris: Secondary | ICD-10-CM | POA: Diagnosis not present

## 2015-02-12 DIAGNOSIS — E139 Other specified diabetes mellitus without complications: Secondary | ICD-10-CM | POA: Diagnosis not present

## 2015-03-03 DIAGNOSIS — M545 Low back pain: Secondary | ICD-10-CM | POA: Diagnosis not present

## 2015-03-03 DIAGNOSIS — F329 Major depressive disorder, single episode, unspecified: Secondary | ICD-10-CM | POA: Diagnosis not present

## 2015-03-03 DIAGNOSIS — E113591 Type 2 diabetes mellitus with proliferative diabetic retinopathy without macular edema, right eye: Secondary | ICD-10-CM | POA: Diagnosis not present

## 2015-03-03 DIAGNOSIS — Z1389 Encounter for screening for other disorder: Secondary | ICD-10-CM | POA: Diagnosis not present

## 2015-03-03 DIAGNOSIS — G4700 Insomnia, unspecified: Secondary | ICD-10-CM | POA: Diagnosis not present

## 2015-03-03 DIAGNOSIS — F411 Generalized anxiety disorder: Secondary | ICD-10-CM | POA: Diagnosis not present

## 2015-03-03 DIAGNOSIS — R809 Proteinuria, unspecified: Secondary | ICD-10-CM | POA: Diagnosis not present

## 2015-03-03 DIAGNOSIS — R609 Edema, unspecified: Secondary | ICD-10-CM | POA: Diagnosis not present

## 2015-03-03 DIAGNOSIS — E781 Pure hyperglyceridemia: Secondary | ICD-10-CM | POA: Diagnosis not present

## 2015-03-25 DIAGNOSIS — E113591 Type 2 diabetes mellitus with proliferative diabetic retinopathy without macular edema, right eye: Secondary | ICD-10-CM | POA: Diagnosis not present

## 2015-03-31 DIAGNOSIS — F411 Generalized anxiety disorder: Secondary | ICD-10-CM | POA: Diagnosis not present

## 2015-03-31 DIAGNOSIS — F329 Major depressive disorder, single episode, unspecified: Secondary | ICD-10-CM | POA: Diagnosis not present

## 2015-03-31 DIAGNOSIS — E781 Pure hyperglyceridemia: Secondary | ICD-10-CM | POA: Diagnosis not present

## 2015-03-31 DIAGNOSIS — R609 Edema, unspecified: Secondary | ICD-10-CM | POA: Diagnosis not present

## 2015-03-31 DIAGNOSIS — R809 Proteinuria, unspecified: Secondary | ICD-10-CM | POA: Diagnosis not present

## 2015-03-31 DIAGNOSIS — E7800 Pure hypercholesterolemia, unspecified: Secondary | ICD-10-CM | POA: Diagnosis not present

## 2015-03-31 DIAGNOSIS — G4700 Insomnia, unspecified: Secondary | ICD-10-CM | POA: Diagnosis not present

## 2015-03-31 DIAGNOSIS — K219 Gastro-esophageal reflux disease without esophagitis: Secondary | ICD-10-CM | POA: Diagnosis not present

## 2015-04-28 DIAGNOSIS — E7800 Pure hypercholesterolemia, unspecified: Secondary | ICD-10-CM | POA: Diagnosis not present

## 2015-04-28 DIAGNOSIS — R609 Edema, unspecified: Secondary | ICD-10-CM | POA: Diagnosis not present

## 2015-04-28 DIAGNOSIS — R809 Proteinuria, unspecified: Secondary | ICD-10-CM | POA: Diagnosis not present

## 2015-04-28 DIAGNOSIS — G4700 Insomnia, unspecified: Secondary | ICD-10-CM | POA: Diagnosis not present

## 2015-04-28 DIAGNOSIS — E11319 Type 2 diabetes mellitus with unspecified diabetic retinopathy without macular edema: Secondary | ICD-10-CM | POA: Diagnosis not present

## 2015-04-28 DIAGNOSIS — I2510 Atherosclerotic heart disease of native coronary artery without angina pectoris: Secondary | ICD-10-CM | POA: Diagnosis not present

## 2015-04-28 DIAGNOSIS — E119 Type 2 diabetes mellitus without complications: Secondary | ICD-10-CM | POA: Diagnosis not present

## 2015-04-28 DIAGNOSIS — K219 Gastro-esophageal reflux disease without esophagitis: Secondary | ICD-10-CM | POA: Diagnosis not present

## 2015-04-28 DIAGNOSIS — F411 Generalized anxiety disorder: Secondary | ICD-10-CM | POA: Diagnosis not present

## 2015-04-28 DIAGNOSIS — F329 Major depressive disorder, single episode, unspecified: Secondary | ICD-10-CM | POA: Diagnosis not present

## 2015-04-28 DIAGNOSIS — I10 Essential (primary) hypertension: Secondary | ICD-10-CM | POA: Diagnosis not present

## 2015-04-28 DIAGNOSIS — E781 Pure hyperglyceridemia: Secondary | ICD-10-CM | POA: Diagnosis not present

## 2015-05-17 DIAGNOSIS — Z8679 Personal history of other diseases of the circulatory system: Secondary | ICD-10-CM | POA: Diagnosis not present

## 2015-05-17 DIAGNOSIS — I739 Peripheral vascular disease, unspecified: Secondary | ICD-10-CM | POA: Diagnosis not present

## 2015-05-17 DIAGNOSIS — I2510 Atherosclerotic heart disease of native coronary artery without angina pectoris: Secondary | ICD-10-CM | POA: Diagnosis not present

## 2015-05-25 DIAGNOSIS — I70213 Atherosclerosis of native arteries of extremities with intermittent claudication, bilateral legs: Secondary | ICD-10-CM | POA: Diagnosis not present

## 2015-05-25 DIAGNOSIS — Z95820 Peripheral vascular angioplasty status with implants and grafts: Secondary | ICD-10-CM | POA: Diagnosis not present

## 2015-05-25 DIAGNOSIS — Z95828 Presence of other vascular implants and grafts: Secondary | ICD-10-CM | POA: Diagnosis not present

## 2015-05-25 DIAGNOSIS — I6523 Occlusion and stenosis of bilateral carotid arteries: Secondary | ICD-10-CM | POA: Diagnosis not present

## 2015-05-25 DIAGNOSIS — I723 Aneurysm of iliac artery: Secondary | ICD-10-CM | POA: Diagnosis not present

## 2015-05-25 DIAGNOSIS — I70203 Unspecified atherosclerosis of native arteries of extremities, bilateral legs: Secondary | ICD-10-CM | POA: Diagnosis not present

## 2015-05-25 DIAGNOSIS — I739 Peripheral vascular disease, unspecified: Secondary | ICD-10-CM | POA: Diagnosis not present

## 2015-05-25 DIAGNOSIS — I714 Abdominal aortic aneurysm, without rupture: Secondary | ICD-10-CM | POA: Diagnosis not present

## 2015-05-25 DIAGNOSIS — E1151 Type 2 diabetes mellitus with diabetic peripheral angiopathy without gangrene: Secondary | ICD-10-CM | POA: Diagnosis not present

## 2015-05-26 DIAGNOSIS — K219 Gastro-esophageal reflux disease without esophagitis: Secondary | ICD-10-CM | POA: Diagnosis not present

## 2015-05-26 DIAGNOSIS — E11319 Type 2 diabetes mellitus with unspecified diabetic retinopathy without macular edema: Secondary | ICD-10-CM | POA: Diagnosis not present

## 2015-05-26 DIAGNOSIS — R609 Edema, unspecified: Secondary | ICD-10-CM | POA: Diagnosis not present

## 2015-05-26 DIAGNOSIS — E119 Type 2 diabetes mellitus without complications: Secondary | ICD-10-CM | POA: Diagnosis not present

## 2015-05-26 DIAGNOSIS — F411 Generalized anxiety disorder: Secondary | ICD-10-CM | POA: Diagnosis not present

## 2015-05-26 DIAGNOSIS — R809 Proteinuria, unspecified: Secondary | ICD-10-CM | POA: Diagnosis not present

## 2015-05-26 DIAGNOSIS — E781 Pure hyperglyceridemia: Secondary | ICD-10-CM | POA: Diagnosis not present

## 2015-05-26 DIAGNOSIS — G4700 Insomnia, unspecified: Secondary | ICD-10-CM | POA: Diagnosis not present

## 2015-05-26 DIAGNOSIS — I2510 Atherosclerotic heart disease of native coronary artery without angina pectoris: Secondary | ICD-10-CM | POA: Diagnosis not present

## 2015-05-26 DIAGNOSIS — F329 Major depressive disorder, single episode, unspecified: Secondary | ICD-10-CM | POA: Diagnosis not present

## 2015-05-26 DIAGNOSIS — I10 Essential (primary) hypertension: Secondary | ICD-10-CM | POA: Diagnosis not present

## 2015-05-26 DIAGNOSIS — E7800 Pure hypercholesterolemia, unspecified: Secondary | ICD-10-CM | POA: Diagnosis not present

## 2015-06-03 DIAGNOSIS — E113591 Type 2 diabetes mellitus with proliferative diabetic retinopathy without macular edema, right eye: Secondary | ICD-10-CM | POA: Diagnosis not present

## 2015-06-23 DIAGNOSIS — F329 Major depressive disorder, single episode, unspecified: Secondary | ICD-10-CM | POA: Diagnosis not present

## 2015-06-23 DIAGNOSIS — E781 Pure hyperglyceridemia: Secondary | ICD-10-CM | POA: Diagnosis not present

## 2015-06-23 DIAGNOSIS — F411 Generalized anxiety disorder: Secondary | ICD-10-CM | POA: Diagnosis not present

## 2015-06-23 DIAGNOSIS — E11319 Type 2 diabetes mellitus with unspecified diabetic retinopathy without macular edema: Secondary | ICD-10-CM | POA: Diagnosis not present

## 2015-06-23 DIAGNOSIS — I10 Essential (primary) hypertension: Secondary | ICD-10-CM | POA: Diagnosis not present

## 2015-06-23 DIAGNOSIS — E7800 Pure hypercholesterolemia, unspecified: Secondary | ICD-10-CM | POA: Diagnosis not present

## 2015-06-23 DIAGNOSIS — M545 Low back pain: Secondary | ICD-10-CM | POA: Diagnosis not present

## 2015-06-23 DIAGNOSIS — E119 Type 2 diabetes mellitus without complications: Secondary | ICD-10-CM | POA: Diagnosis not present

## 2015-06-23 DIAGNOSIS — G4700 Insomnia, unspecified: Secondary | ICD-10-CM | POA: Diagnosis not present

## 2015-06-23 DIAGNOSIS — R809 Proteinuria, unspecified: Secondary | ICD-10-CM | POA: Diagnosis not present

## 2015-06-23 DIAGNOSIS — I2510 Atherosclerotic heart disease of native coronary artery without angina pectoris: Secondary | ICD-10-CM | POA: Diagnosis not present

## 2015-06-23 DIAGNOSIS — K219 Gastro-esophageal reflux disease without esophagitis: Secondary | ICD-10-CM | POA: Diagnosis not present

## 2015-06-28 DIAGNOSIS — M2041 Other hammer toe(s) (acquired), right foot: Secondary | ICD-10-CM

## 2015-06-28 DIAGNOSIS — B351 Tinea unguium: Secondary | ICD-10-CM | POA: Insufficient documentation

## 2015-06-28 DIAGNOSIS — E1142 Type 2 diabetes mellitus with diabetic polyneuropathy: Secondary | ICD-10-CM | POA: Diagnosis not present

## 2015-06-28 DIAGNOSIS — E0842 Diabetes mellitus due to underlying condition with diabetic polyneuropathy: Secondary | ICD-10-CM

## 2015-06-28 DIAGNOSIS — E1151 Type 2 diabetes mellitus with diabetic peripheral angiopathy without gangrene: Secondary | ICD-10-CM

## 2015-06-28 DIAGNOSIS — M2042 Other hammer toe(s) (acquired), left foot: Secondary | ICD-10-CM

## 2015-06-28 HISTORY — DX: Diabetes mellitus due to underlying condition with diabetic polyneuropathy: E08.42

## 2015-06-28 HISTORY — DX: Type 2 diabetes mellitus with diabetic peripheral angiopathy without gangrene: E11.51

## 2015-06-28 HISTORY — DX: Other hammer toe(s) (acquired), right foot: M20.41

## 2015-06-28 HISTORY — DX: Tinea unguium: B35.1

## 2015-07-21 DIAGNOSIS — E11319 Type 2 diabetes mellitus with unspecified diabetic retinopathy without macular edema: Secondary | ICD-10-CM | POA: Diagnosis not present

## 2015-07-21 DIAGNOSIS — E7800 Pure hypercholesterolemia, unspecified: Secondary | ICD-10-CM | POA: Diagnosis not present

## 2015-07-21 DIAGNOSIS — F329 Major depressive disorder, single episode, unspecified: Secondary | ICD-10-CM | POA: Diagnosis not present

## 2015-07-21 DIAGNOSIS — G4700 Insomnia, unspecified: Secondary | ICD-10-CM | POA: Diagnosis not present

## 2015-07-21 DIAGNOSIS — E1140 Type 2 diabetes mellitus with diabetic neuropathy, unspecified: Secondary | ICD-10-CM | POA: Diagnosis not present

## 2015-07-21 DIAGNOSIS — E781 Pure hyperglyceridemia: Secondary | ICD-10-CM | POA: Diagnosis not present

## 2015-07-21 DIAGNOSIS — I10 Essential (primary) hypertension: Secondary | ICD-10-CM | POA: Diagnosis not present

## 2015-07-21 DIAGNOSIS — K219 Gastro-esophageal reflux disease without esophagitis: Secondary | ICD-10-CM | POA: Diagnosis not present

## 2015-07-21 DIAGNOSIS — E119 Type 2 diabetes mellitus without complications: Secondary | ICD-10-CM | POA: Diagnosis not present

## 2015-07-21 DIAGNOSIS — F411 Generalized anxiety disorder: Secondary | ICD-10-CM | POA: Diagnosis not present

## 2015-07-21 DIAGNOSIS — I2510 Atherosclerotic heart disease of native coronary artery without angina pectoris: Secondary | ICD-10-CM | POA: Diagnosis not present

## 2015-07-21 DIAGNOSIS — R809 Proteinuria, unspecified: Secondary | ICD-10-CM | POA: Diagnosis not present

## 2015-07-28 DIAGNOSIS — I10 Essential (primary) hypertension: Secondary | ICD-10-CM | POA: Diagnosis not present

## 2015-08-02 DIAGNOSIS — E1151 Type 2 diabetes mellitus with diabetic peripheral angiopathy without gangrene: Secondary | ICD-10-CM | POA: Diagnosis not present

## 2015-08-18 DIAGNOSIS — F411 Generalized anxiety disorder: Secondary | ICD-10-CM | POA: Diagnosis not present

## 2015-08-18 DIAGNOSIS — E119 Type 2 diabetes mellitus without complications: Secondary | ICD-10-CM | POA: Diagnosis not present

## 2015-08-18 DIAGNOSIS — R809 Proteinuria, unspecified: Secondary | ICD-10-CM | POA: Diagnosis not present

## 2015-08-18 DIAGNOSIS — E781 Pure hyperglyceridemia: Secondary | ICD-10-CM | POA: Diagnosis not present

## 2015-08-18 DIAGNOSIS — I2510 Atherosclerotic heart disease of native coronary artery without angina pectoris: Secondary | ICD-10-CM | POA: Diagnosis not present

## 2015-08-18 DIAGNOSIS — F329 Major depressive disorder, single episode, unspecified: Secondary | ICD-10-CM | POA: Diagnosis not present

## 2015-08-18 DIAGNOSIS — M545 Low back pain: Secondary | ICD-10-CM | POA: Diagnosis not present

## 2015-08-18 DIAGNOSIS — I10 Essential (primary) hypertension: Secondary | ICD-10-CM | POA: Diagnosis not present

## 2015-08-18 DIAGNOSIS — E11319 Type 2 diabetes mellitus with unspecified diabetic retinopathy without macular edema: Secondary | ICD-10-CM | POA: Diagnosis not present

## 2015-08-18 DIAGNOSIS — E7800 Pure hypercholesterolemia, unspecified: Secondary | ICD-10-CM | POA: Diagnosis not present

## 2015-08-18 DIAGNOSIS — K219 Gastro-esophageal reflux disease without esophagitis: Secondary | ICD-10-CM | POA: Diagnosis not present

## 2015-08-18 DIAGNOSIS — G4700 Insomnia, unspecified: Secondary | ICD-10-CM | POA: Diagnosis not present

## 2015-09-02 DIAGNOSIS — H401131 Primary open-angle glaucoma, bilateral, mild stage: Secondary | ICD-10-CM | POA: Diagnosis not present

## 2015-09-15 DIAGNOSIS — F329 Major depressive disorder, single episode, unspecified: Secondary | ICD-10-CM | POA: Diagnosis not present

## 2015-09-15 DIAGNOSIS — F411 Generalized anxiety disorder: Secondary | ICD-10-CM | POA: Diagnosis not present

## 2015-09-15 DIAGNOSIS — R809 Proteinuria, unspecified: Secondary | ICD-10-CM | POA: Diagnosis not present

## 2015-09-15 DIAGNOSIS — G4700 Insomnia, unspecified: Secondary | ICD-10-CM | POA: Diagnosis not present

## 2015-09-15 DIAGNOSIS — R609 Edema, unspecified: Secondary | ICD-10-CM | POA: Diagnosis not present

## 2015-09-15 DIAGNOSIS — K219 Gastro-esophageal reflux disease without esophagitis: Secondary | ICD-10-CM | POA: Diagnosis not present

## 2015-09-15 DIAGNOSIS — E7800 Pure hypercholesterolemia, unspecified: Secondary | ICD-10-CM | POA: Diagnosis not present

## 2015-09-15 DIAGNOSIS — E119 Type 2 diabetes mellitus without complications: Secondary | ICD-10-CM | POA: Diagnosis not present

## 2015-09-15 DIAGNOSIS — M545 Low back pain: Secondary | ICD-10-CM | POA: Diagnosis not present

## 2015-09-15 DIAGNOSIS — I2510 Atherosclerotic heart disease of native coronary artery without angina pectoris: Secondary | ICD-10-CM | POA: Diagnosis not present

## 2015-09-15 DIAGNOSIS — E11319 Type 2 diabetes mellitus with unspecified diabetic retinopathy without macular edema: Secondary | ICD-10-CM | POA: Diagnosis not present

## 2015-09-15 DIAGNOSIS — E781 Pure hyperglyceridemia: Secondary | ICD-10-CM | POA: Diagnosis not present

## 2015-09-15 DIAGNOSIS — I10 Essential (primary) hypertension: Secondary | ICD-10-CM | POA: Diagnosis not present

## 2015-10-13 DIAGNOSIS — G4700 Insomnia, unspecified: Secondary | ICD-10-CM | POA: Diagnosis not present

## 2015-10-13 DIAGNOSIS — E7800 Pure hypercholesterolemia, unspecified: Secondary | ICD-10-CM | POA: Diagnosis not present

## 2015-10-13 DIAGNOSIS — M545 Low back pain: Secondary | ICD-10-CM | POA: Diagnosis not present

## 2015-10-13 DIAGNOSIS — E11319 Type 2 diabetes mellitus with unspecified diabetic retinopathy without macular edema: Secondary | ICD-10-CM | POA: Diagnosis not present

## 2015-10-13 DIAGNOSIS — F411 Generalized anxiety disorder: Secondary | ICD-10-CM | POA: Diagnosis not present

## 2015-10-13 DIAGNOSIS — R609 Edema, unspecified: Secondary | ICD-10-CM | POA: Diagnosis not present

## 2015-10-13 DIAGNOSIS — F329 Major depressive disorder, single episode, unspecified: Secondary | ICD-10-CM | POA: Diagnosis not present

## 2015-10-13 DIAGNOSIS — I2510 Atherosclerotic heart disease of native coronary artery without angina pectoris: Secondary | ICD-10-CM | POA: Diagnosis not present

## 2015-10-13 DIAGNOSIS — K219 Gastro-esophageal reflux disease without esophagitis: Secondary | ICD-10-CM | POA: Diagnosis not present

## 2015-10-13 DIAGNOSIS — R809 Proteinuria, unspecified: Secondary | ICD-10-CM | POA: Diagnosis not present

## 2015-10-13 DIAGNOSIS — E119 Type 2 diabetes mellitus without complications: Secondary | ICD-10-CM | POA: Diagnosis not present

## 2015-10-13 DIAGNOSIS — E781 Pure hyperglyceridemia: Secondary | ICD-10-CM | POA: Diagnosis not present

## 2015-11-10 DIAGNOSIS — E11319 Type 2 diabetes mellitus with unspecified diabetic retinopathy without macular edema: Secondary | ICD-10-CM | POA: Diagnosis not present

## 2015-11-10 DIAGNOSIS — G4700 Insomnia, unspecified: Secondary | ICD-10-CM | POA: Diagnosis not present

## 2015-11-10 DIAGNOSIS — K219 Gastro-esophageal reflux disease without esophagitis: Secondary | ICD-10-CM | POA: Diagnosis not present

## 2015-11-10 DIAGNOSIS — E781 Pure hyperglyceridemia: Secondary | ICD-10-CM | POA: Diagnosis not present

## 2015-11-10 DIAGNOSIS — F411 Generalized anxiety disorder: Secondary | ICD-10-CM | POA: Diagnosis not present

## 2015-11-10 DIAGNOSIS — F329 Major depressive disorder, single episode, unspecified: Secondary | ICD-10-CM | POA: Diagnosis not present

## 2015-11-10 DIAGNOSIS — R609 Edema, unspecified: Secondary | ICD-10-CM | POA: Diagnosis not present

## 2015-11-10 DIAGNOSIS — E1140 Type 2 diabetes mellitus with diabetic neuropathy, unspecified: Secondary | ICD-10-CM | POA: Diagnosis not present

## 2015-11-10 DIAGNOSIS — M545 Low back pain: Secondary | ICD-10-CM | POA: Diagnosis not present

## 2015-11-10 DIAGNOSIS — E7800 Pure hypercholesterolemia, unspecified: Secondary | ICD-10-CM | POA: Diagnosis not present

## 2015-11-10 DIAGNOSIS — R809 Proteinuria, unspecified: Secondary | ICD-10-CM | POA: Diagnosis not present

## 2015-11-10 DIAGNOSIS — I10 Essential (primary) hypertension: Secondary | ICD-10-CM | POA: Diagnosis not present

## 2015-11-10 DIAGNOSIS — I2510 Atherosclerotic heart disease of native coronary artery without angina pectoris: Secondary | ICD-10-CM | POA: Diagnosis not present

## 2015-11-24 DIAGNOSIS — E785 Hyperlipidemia, unspecified: Secondary | ICD-10-CM

## 2015-11-24 DIAGNOSIS — Z8679 Personal history of other diseases of the circulatory system: Secondary | ICD-10-CM | POA: Diagnosis not present

## 2015-11-24 DIAGNOSIS — I739 Peripheral vascular disease, unspecified: Secondary | ICD-10-CM | POA: Diagnosis not present

## 2015-11-24 DIAGNOSIS — I2510 Atherosclerotic heart disease of native coronary artery without angina pectoris: Secondary | ICD-10-CM

## 2015-11-24 HISTORY — DX: Atherosclerotic heart disease of native coronary artery without angina pectoris: I25.10

## 2015-11-24 HISTORY — DX: Hyperlipidemia, unspecified: E78.5

## 2015-12-07 DIAGNOSIS — H401131 Primary open-angle glaucoma, bilateral, mild stage: Secondary | ICD-10-CM | POA: Diagnosis not present

## 2015-12-07 DIAGNOSIS — E113591 Type 2 diabetes mellitus with proliferative diabetic retinopathy without macular edema, right eye: Secondary | ICD-10-CM | POA: Diagnosis not present

## 2015-12-07 DIAGNOSIS — H2513 Age-related nuclear cataract, bilateral: Secondary | ICD-10-CM | POA: Diagnosis not present

## 2015-12-08 DIAGNOSIS — K219 Gastro-esophageal reflux disease without esophagitis: Secondary | ICD-10-CM | POA: Diagnosis not present

## 2015-12-08 DIAGNOSIS — E113299 Type 2 diabetes mellitus with mild nonproliferative diabetic retinopathy without macular edema, unspecified eye: Secondary | ICD-10-CM | POA: Diagnosis not present

## 2015-12-08 DIAGNOSIS — E119 Type 2 diabetes mellitus without complications: Secondary | ICD-10-CM | POA: Diagnosis not present

## 2015-12-08 DIAGNOSIS — F329 Major depressive disorder, single episode, unspecified: Secondary | ICD-10-CM | POA: Diagnosis not present

## 2015-12-08 DIAGNOSIS — F411 Generalized anxiety disorder: Secondary | ICD-10-CM | POA: Diagnosis not present

## 2015-12-08 DIAGNOSIS — E781 Pure hyperglyceridemia: Secondary | ICD-10-CM | POA: Diagnosis not present

## 2015-12-08 DIAGNOSIS — G4700 Insomnia, unspecified: Secondary | ICD-10-CM | POA: Diagnosis not present

## 2015-12-08 DIAGNOSIS — R809 Proteinuria, unspecified: Secondary | ICD-10-CM | POA: Diagnosis not present

## 2015-12-08 DIAGNOSIS — R609 Edema, unspecified: Secondary | ICD-10-CM | POA: Diagnosis not present

## 2015-12-08 DIAGNOSIS — E7800 Pure hypercholesterolemia, unspecified: Secondary | ICD-10-CM | POA: Diagnosis not present

## 2015-12-08 DIAGNOSIS — R5383 Other fatigue: Secondary | ICD-10-CM | POA: Diagnosis not present

## 2015-12-08 DIAGNOSIS — M545 Low back pain: Secondary | ICD-10-CM | POA: Diagnosis not present

## 2015-12-08 DIAGNOSIS — I2510 Atherosclerotic heart disease of native coronary artery without angina pectoris: Secondary | ICD-10-CM | POA: Diagnosis not present

## 2016-01-05 DIAGNOSIS — M545 Low back pain: Secondary | ICD-10-CM | POA: Diagnosis not present

## 2016-01-05 DIAGNOSIS — I2510 Atherosclerotic heart disease of native coronary artery without angina pectoris: Secondary | ICD-10-CM | POA: Diagnosis not present

## 2016-01-05 DIAGNOSIS — K219 Gastro-esophageal reflux disease without esophagitis: Secondary | ICD-10-CM | POA: Diagnosis not present

## 2016-01-05 DIAGNOSIS — G4700 Insomnia, unspecified: Secondary | ICD-10-CM | POA: Diagnosis not present

## 2016-01-05 DIAGNOSIS — F411 Generalized anxiety disorder: Secondary | ICD-10-CM | POA: Diagnosis not present

## 2016-01-05 DIAGNOSIS — F329 Major depressive disorder, single episode, unspecified: Secondary | ICD-10-CM | POA: Diagnosis not present

## 2016-01-05 DIAGNOSIS — R809 Proteinuria, unspecified: Secondary | ICD-10-CM | POA: Diagnosis not present

## 2016-01-05 DIAGNOSIS — R609 Edema, unspecified: Secondary | ICD-10-CM | POA: Diagnosis not present

## 2016-01-05 DIAGNOSIS — E781 Pure hyperglyceridemia: Secondary | ICD-10-CM | POA: Diagnosis not present

## 2016-01-05 DIAGNOSIS — E113299 Type 2 diabetes mellitus with mild nonproliferative diabetic retinopathy without macular edema, unspecified eye: Secondary | ICD-10-CM | POA: Diagnosis not present

## 2016-01-05 DIAGNOSIS — E1140 Type 2 diabetes mellitus with diabetic neuropathy, unspecified: Secondary | ICD-10-CM | POA: Diagnosis not present

## 2016-01-05 DIAGNOSIS — E7800 Pure hypercholesterolemia, unspecified: Secondary | ICD-10-CM | POA: Diagnosis not present

## 2016-01-18 DIAGNOSIS — Z1211 Encounter for screening for malignant neoplasm of colon: Secondary | ICD-10-CM | POA: Diagnosis not present

## 2016-02-02 DIAGNOSIS — Z23 Encounter for immunization: Secondary | ICD-10-CM | POA: Diagnosis not present

## 2016-02-02 DIAGNOSIS — G4700 Insomnia, unspecified: Secondary | ICD-10-CM | POA: Diagnosis not present

## 2016-02-02 DIAGNOSIS — I2510 Atherosclerotic heart disease of native coronary artery without angina pectoris: Secondary | ICD-10-CM | POA: Diagnosis not present

## 2016-02-02 DIAGNOSIS — E781 Pure hyperglyceridemia: Secondary | ICD-10-CM | POA: Diagnosis not present

## 2016-02-02 DIAGNOSIS — I10 Essential (primary) hypertension: Secondary | ICD-10-CM | POA: Diagnosis not present

## 2016-02-02 DIAGNOSIS — R609 Edema, unspecified: Secondary | ICD-10-CM | POA: Diagnosis not present

## 2016-02-02 DIAGNOSIS — K219 Gastro-esophageal reflux disease without esophagitis: Secondary | ICD-10-CM | POA: Diagnosis not present

## 2016-02-02 DIAGNOSIS — M545 Low back pain: Secondary | ICD-10-CM | POA: Diagnosis not present

## 2016-02-02 DIAGNOSIS — F411 Generalized anxiety disorder: Secondary | ICD-10-CM | POA: Diagnosis not present

## 2016-02-02 DIAGNOSIS — R809 Proteinuria, unspecified: Secondary | ICD-10-CM | POA: Diagnosis not present

## 2016-02-02 DIAGNOSIS — E7800 Pure hypercholesterolemia, unspecified: Secondary | ICD-10-CM | POA: Diagnosis not present

## 2016-02-02 DIAGNOSIS — F329 Major depressive disorder, single episode, unspecified: Secondary | ICD-10-CM | POA: Diagnosis not present

## 2016-02-02 DIAGNOSIS — E113299 Type 2 diabetes mellitus with mild nonproliferative diabetic retinopathy without macular edema, unspecified eye: Secondary | ICD-10-CM | POA: Diagnosis not present

## 2016-02-16 DIAGNOSIS — K648 Other hemorrhoids: Secondary | ICD-10-CM | POA: Diagnosis not present

## 2016-02-16 DIAGNOSIS — D175 Benign lipomatous neoplasm of intra-abdominal organs: Secondary | ICD-10-CM | POA: Diagnosis not present

## 2016-02-16 DIAGNOSIS — Z1211 Encounter for screening for malignant neoplasm of colon: Secondary | ICD-10-CM | POA: Diagnosis not present

## 2016-02-16 DIAGNOSIS — K219 Gastro-esophageal reflux disease without esophagitis: Secondary | ICD-10-CM | POA: Diagnosis not present

## 2016-02-16 DIAGNOSIS — K5730 Diverticulosis of large intestine without perforation or abscess without bleeding: Secondary | ICD-10-CM | POA: Diagnosis not present

## 2016-02-16 DIAGNOSIS — Z955 Presence of coronary angioplasty implant and graft: Secondary | ICD-10-CM | POA: Diagnosis not present

## 2016-02-16 DIAGNOSIS — I10 Essential (primary) hypertension: Secondary | ICD-10-CM | POA: Diagnosis not present

## 2016-02-16 DIAGNOSIS — Z87891 Personal history of nicotine dependence: Secondary | ICD-10-CM | POA: Diagnosis not present

## 2016-02-16 DIAGNOSIS — I252 Old myocardial infarction: Secondary | ICD-10-CM | POA: Diagnosis not present

## 2016-02-16 DIAGNOSIS — Z79899 Other long term (current) drug therapy: Secondary | ICD-10-CM | POA: Diagnosis not present

## 2016-02-16 DIAGNOSIS — F329 Major depressive disorder, single episode, unspecified: Secondary | ICD-10-CM | POA: Diagnosis not present

## 2016-02-16 DIAGNOSIS — E119 Type 2 diabetes mellitus without complications: Secondary | ICD-10-CM | POA: Diagnosis not present

## 2016-02-16 DIAGNOSIS — I2510 Atherosclerotic heart disease of native coronary artery without angina pectoris: Secondary | ICD-10-CM | POA: Diagnosis not present

## 2016-02-16 DIAGNOSIS — F419 Anxiety disorder, unspecified: Secondary | ICD-10-CM | POA: Diagnosis not present

## 2016-02-16 DIAGNOSIS — E7800 Pure hypercholesterolemia, unspecified: Secondary | ICD-10-CM | POA: Diagnosis not present

## 2016-02-16 DIAGNOSIS — D125 Benign neoplasm of sigmoid colon: Secondary | ICD-10-CM | POA: Diagnosis not present

## 2016-02-16 DIAGNOSIS — Z8673 Personal history of transient ischemic attack (TIA), and cerebral infarction without residual deficits: Secondary | ICD-10-CM | POA: Diagnosis not present

## 2016-02-16 DIAGNOSIS — D122 Benign neoplasm of ascending colon: Secondary | ICD-10-CM | POA: Diagnosis not present

## 2016-02-16 DIAGNOSIS — I739 Peripheral vascular disease, unspecified: Secondary | ICD-10-CM | POA: Diagnosis not present

## 2016-02-16 DIAGNOSIS — G4700 Insomnia, unspecified: Secondary | ICD-10-CM | POA: Diagnosis not present

## 2016-03-01 DIAGNOSIS — R809 Proteinuria, unspecified: Secondary | ICD-10-CM | POA: Diagnosis not present

## 2016-03-01 DIAGNOSIS — M545 Low back pain: Secondary | ICD-10-CM | POA: Diagnosis not present

## 2016-03-01 DIAGNOSIS — R609 Edema, unspecified: Secondary | ICD-10-CM | POA: Diagnosis not present

## 2016-03-01 DIAGNOSIS — F411 Generalized anxiety disorder: Secondary | ICD-10-CM | POA: Diagnosis not present

## 2016-03-01 DIAGNOSIS — E781 Pure hyperglyceridemia: Secondary | ICD-10-CM | POA: Diagnosis not present

## 2016-03-01 DIAGNOSIS — I10 Essential (primary) hypertension: Secondary | ICD-10-CM | POA: Diagnosis not present

## 2016-03-01 DIAGNOSIS — K219 Gastro-esophageal reflux disease without esophagitis: Secondary | ICD-10-CM | POA: Diagnosis not present

## 2016-03-01 DIAGNOSIS — E7800 Pure hypercholesterolemia, unspecified: Secondary | ICD-10-CM | POA: Diagnosis not present

## 2016-03-01 DIAGNOSIS — E113299 Type 2 diabetes mellitus with mild nonproliferative diabetic retinopathy without macular edema, unspecified eye: Secondary | ICD-10-CM | POA: Diagnosis not present

## 2016-03-01 DIAGNOSIS — I2510 Atherosclerotic heart disease of native coronary artery without angina pectoris: Secondary | ICD-10-CM | POA: Diagnosis not present

## 2016-03-01 DIAGNOSIS — Z79891 Long term (current) use of opiate analgesic: Secondary | ICD-10-CM | POA: Diagnosis not present

## 2016-03-01 DIAGNOSIS — G4700 Insomnia, unspecified: Secondary | ICD-10-CM | POA: Diagnosis not present

## 2016-03-06 DIAGNOSIS — H401131 Primary open-angle glaucoma, bilateral, mild stage: Secondary | ICD-10-CM | POA: Diagnosis not present

## 2016-03-09 DIAGNOSIS — H524 Presbyopia: Secondary | ICD-10-CM | POA: Diagnosis not present

## 2016-03-23 DIAGNOSIS — I10 Essential (primary) hypertension: Secondary | ICD-10-CM | POA: Diagnosis not present

## 2016-03-29 DIAGNOSIS — K219 Gastro-esophageal reflux disease without esophagitis: Secondary | ICD-10-CM | POA: Diagnosis not present

## 2016-03-29 DIAGNOSIS — E113299 Type 2 diabetes mellitus with mild nonproliferative diabetic retinopathy without macular edema, unspecified eye: Secondary | ICD-10-CM | POA: Diagnosis not present

## 2016-03-29 DIAGNOSIS — R809 Proteinuria, unspecified: Secondary | ICD-10-CM | POA: Diagnosis not present

## 2016-03-29 DIAGNOSIS — E119 Type 2 diabetes mellitus without complications: Secondary | ICD-10-CM | POA: Diagnosis not present

## 2016-03-29 DIAGNOSIS — E7800 Pure hypercholesterolemia, unspecified: Secondary | ICD-10-CM | POA: Diagnosis not present

## 2016-03-29 DIAGNOSIS — E781 Pure hyperglyceridemia: Secondary | ICD-10-CM | POA: Diagnosis not present

## 2016-03-29 DIAGNOSIS — R609 Edema, unspecified: Secondary | ICD-10-CM | POA: Diagnosis not present

## 2016-03-29 DIAGNOSIS — M545 Low back pain: Secondary | ICD-10-CM | POA: Diagnosis not present

## 2016-03-29 DIAGNOSIS — I2510 Atherosclerotic heart disease of native coronary artery without angina pectoris: Secondary | ICD-10-CM | POA: Diagnosis not present

## 2016-03-29 DIAGNOSIS — Z1389 Encounter for screening for other disorder: Secondary | ICD-10-CM | POA: Diagnosis not present

## 2016-03-29 DIAGNOSIS — G4700 Insomnia, unspecified: Secondary | ICD-10-CM | POA: Diagnosis not present

## 2016-03-29 DIAGNOSIS — E1140 Type 2 diabetes mellitus with diabetic neuropathy, unspecified: Secondary | ICD-10-CM | POA: Diagnosis not present

## 2016-04-19 DIAGNOSIS — I471 Supraventricular tachycardia: Secondary | ICD-10-CM | POA: Diagnosis not present

## 2016-04-19 DIAGNOSIS — Z87891 Personal history of nicotine dependence: Secondary | ICD-10-CM | POA: Diagnosis not present

## 2016-04-19 DIAGNOSIS — Z8249 Family history of ischemic heart disease and other diseases of the circulatory system: Secondary | ICD-10-CM | POA: Diagnosis not present

## 2016-04-19 DIAGNOSIS — Z794 Long term (current) use of insulin: Secondary | ICD-10-CM | POA: Diagnosis not present

## 2016-04-19 DIAGNOSIS — R918 Other nonspecific abnormal finding of lung field: Secondary | ICD-10-CM

## 2016-04-19 DIAGNOSIS — N189 Chronic kidney disease, unspecified: Secondary | ICD-10-CM | POA: Diagnosis not present

## 2016-04-19 DIAGNOSIS — F418 Other specified anxiety disorders: Secondary | ICD-10-CM | POA: Diagnosis not present

## 2016-04-19 DIAGNOSIS — E784 Other hyperlipidemia: Secondary | ICD-10-CM | POA: Diagnosis not present

## 2016-04-19 DIAGNOSIS — I129 Hypertensive chronic kidney disease with stage 1 through stage 4 chronic kidney disease, or unspecified chronic kidney disease: Secondary | ICD-10-CM | POA: Diagnosis not present

## 2016-04-19 DIAGNOSIS — I10 Essential (primary) hypertension: Secondary | ICD-10-CM

## 2016-04-19 DIAGNOSIS — Z7982 Long term (current) use of aspirin: Secondary | ICD-10-CM | POA: Diagnosis not present

## 2016-04-19 DIAGNOSIS — E119 Type 2 diabetes mellitus without complications: Secondary | ICD-10-CM

## 2016-04-19 DIAGNOSIS — D649 Anemia, unspecified: Secondary | ICD-10-CM | POA: Diagnosis not present

## 2016-04-19 DIAGNOSIS — E1122 Type 2 diabetes mellitus with diabetic chronic kidney disease: Secondary | ICD-10-CM | POA: Diagnosis not present

## 2016-04-19 DIAGNOSIS — Z8673 Personal history of transient ischemic attack (TIA), and cerebral infarction without residual deficits: Secondary | ICD-10-CM | POA: Diagnosis not present

## 2016-04-19 DIAGNOSIS — N179 Acute kidney failure, unspecified: Secondary | ICD-10-CM | POA: Diagnosis not present

## 2016-04-19 DIAGNOSIS — Z79899 Other long term (current) drug therapy: Secondary | ICD-10-CM | POA: Diagnosis not present

## 2016-04-19 DIAGNOSIS — I214 Non-ST elevation (NSTEMI) myocardial infarction: Secondary | ICD-10-CM

## 2016-04-19 DIAGNOSIS — I252 Old myocardial infarction: Secondary | ICD-10-CM | POA: Diagnosis not present

## 2016-04-19 DIAGNOSIS — M1990 Unspecified osteoarthritis, unspecified site: Secondary | ICD-10-CM | POA: Diagnosis not present

## 2016-04-19 DIAGNOSIS — I119 Hypertensive heart disease without heart failure: Secondary | ICD-10-CM | POA: Diagnosis not present

## 2016-04-19 DIAGNOSIS — Z7902 Long term (current) use of antithrombotics/antiplatelets: Secondary | ICD-10-CM | POA: Diagnosis not present

## 2016-04-19 DIAGNOSIS — I2510 Atherosclerotic heart disease of native coronary artery without angina pectoris: Secondary | ICD-10-CM | POA: Diagnosis not present

## 2016-04-19 DIAGNOSIS — G4733 Obstructive sleep apnea (adult) (pediatric): Secondary | ICD-10-CM | POA: Diagnosis not present

## 2016-04-19 DIAGNOSIS — Z955 Presence of coronary angioplasty implant and graft: Secondary | ICD-10-CM | POA: Diagnosis not present

## 2016-04-19 DIAGNOSIS — M109 Gout, unspecified: Secondary | ICD-10-CM | POA: Diagnosis not present

## 2016-04-19 DIAGNOSIS — R079 Chest pain, unspecified: Secondary | ICD-10-CM | POA: Diagnosis not present

## 2016-04-19 DIAGNOSIS — E7800 Pure hypercholesterolemia, unspecified: Secondary | ICD-10-CM | POA: Diagnosis not present

## 2016-04-19 DIAGNOSIS — I739 Peripheral vascular disease, unspecified: Secondary | ICD-10-CM | POA: Diagnosis not present

## 2016-04-19 DIAGNOSIS — F17211 Nicotine dependence, cigarettes, in remission: Secondary | ICD-10-CM | POA: Diagnosis not present

## 2016-04-19 DIAGNOSIS — E785 Hyperlipidemia, unspecified: Secondary | ICD-10-CM

## 2016-04-20 DIAGNOSIS — I739 Peripheral vascular disease, unspecified: Secondary | ICD-10-CM | POA: Diagnosis not present

## 2016-04-20 DIAGNOSIS — E1139 Type 2 diabetes mellitus with other diabetic ophthalmic complication: Secondary | ICD-10-CM | POA: Diagnosis not present

## 2016-04-20 DIAGNOSIS — I21A1 Myocardial infarction type 2: Secondary | ICD-10-CM | POA: Diagnosis not present

## 2016-04-20 DIAGNOSIS — I2510 Atherosclerotic heart disease of native coronary artery without angina pectoris: Secondary | ICD-10-CM | POA: Diagnosis not present

## 2016-04-20 DIAGNOSIS — I480 Paroxysmal atrial fibrillation: Secondary | ICD-10-CM | POA: Diagnosis not present

## 2016-04-20 DIAGNOSIS — I130 Hypertensive heart and chronic kidney disease with heart failure and stage 1 through stage 4 chronic kidney disease, or unspecified chronic kidney disease: Secondary | ICD-10-CM | POA: Diagnosis not present

## 2016-04-20 DIAGNOSIS — R9431 Abnormal electrocardiogram [ECG] [EKG]: Secondary | ICD-10-CM | POA: Diagnosis not present

## 2016-04-20 DIAGNOSIS — Z8673 Personal history of transient ischemic attack (TIA), and cerebral infarction without residual deficits: Secondary | ICD-10-CM | POA: Diagnosis not present

## 2016-04-20 DIAGNOSIS — I252 Old myocardial infarction: Secondary | ICD-10-CM | POA: Diagnosis not present

## 2016-04-20 DIAGNOSIS — Z7982 Long term (current) use of aspirin: Secondary | ICD-10-CM | POA: Diagnosis not present

## 2016-04-20 DIAGNOSIS — I255 Ischemic cardiomyopathy: Secondary | ICD-10-CM | POA: Diagnosis not present

## 2016-04-20 DIAGNOSIS — I1310 Hypertensive heart and chronic kidney disease without heart failure, with stage 1 through stage 4 chronic kidney disease, or unspecified chronic kidney disease: Secondary | ICD-10-CM | POA: Diagnosis not present

## 2016-04-20 DIAGNOSIS — Z794 Long term (current) use of insulin: Secondary | ICD-10-CM | POA: Diagnosis not present

## 2016-04-20 DIAGNOSIS — E784 Other hyperlipidemia: Secondary | ICD-10-CM | POA: Diagnosis not present

## 2016-04-20 DIAGNOSIS — G4733 Obstructive sleep apnea (adult) (pediatric): Secondary | ICD-10-CM | POA: Diagnosis not present

## 2016-04-20 DIAGNOSIS — H409 Unspecified glaucoma: Secondary | ICD-10-CM | POA: Diagnosis not present

## 2016-04-20 DIAGNOSIS — Z87891 Personal history of nicotine dependence: Secondary | ICD-10-CM | POA: Diagnosis not present

## 2016-04-20 DIAGNOSIS — R0602 Shortness of breath: Secondary | ICD-10-CM | POA: Diagnosis not present

## 2016-04-20 DIAGNOSIS — I119 Hypertensive heart disease without heart failure: Secondary | ICD-10-CM | POA: Diagnosis not present

## 2016-04-20 DIAGNOSIS — F17211 Nicotine dependence, cigarettes, in remission: Secondary | ICD-10-CM | POA: Diagnosis not present

## 2016-04-20 DIAGNOSIS — I471 Supraventricular tachycardia: Secondary | ICD-10-CM | POA: Diagnosis not present

## 2016-04-20 DIAGNOSIS — I714 Abdominal aortic aneurysm, without rupture: Secondary | ICD-10-CM | POA: Diagnosis not present

## 2016-04-20 DIAGNOSIS — Z8249 Family history of ischemic heart disease and other diseases of the circulatory system: Secondary | ICD-10-CM | POA: Diagnosis not present

## 2016-04-20 DIAGNOSIS — K219 Gastro-esophageal reflux disease without esophagitis: Secondary | ICD-10-CM | POA: Diagnosis not present

## 2016-04-20 DIAGNOSIS — R079 Chest pain, unspecified: Secondary | ICD-10-CM | POA: Diagnosis not present

## 2016-04-20 DIAGNOSIS — D649 Anemia, unspecified: Secondary | ICD-10-CM | POA: Diagnosis not present

## 2016-04-20 DIAGNOSIS — R000 Tachycardia, unspecified: Secondary | ICD-10-CM | POA: Diagnosis not present

## 2016-04-20 DIAGNOSIS — E1122 Type 2 diabetes mellitus with diabetic chronic kidney disease: Secondary | ICD-10-CM | POA: Diagnosis not present

## 2016-04-20 DIAGNOSIS — I214 Non-ST elevation (NSTEMI) myocardial infarction: Secondary | ICD-10-CM | POA: Diagnosis not present

## 2016-04-20 DIAGNOSIS — E1151 Type 2 diabetes mellitus with diabetic peripheral angiopathy without gangrene: Secondary | ICD-10-CM | POA: Diagnosis not present

## 2016-04-20 DIAGNOSIS — N179 Acute kidney failure, unspecified: Secondary | ICD-10-CM | POA: Diagnosis not present

## 2016-04-20 DIAGNOSIS — I25119 Atherosclerotic heart disease of native coronary artery with unspecified angina pectoris: Secondary | ICD-10-CM | POA: Diagnosis not present

## 2016-04-20 DIAGNOSIS — I5022 Chronic systolic (congestive) heart failure: Secondary | ICD-10-CM | POA: Diagnosis not present

## 2016-04-20 DIAGNOSIS — N189 Chronic kidney disease, unspecified: Secondary | ICD-10-CM | POA: Diagnosis not present

## 2016-04-20 DIAGNOSIS — N178 Other acute kidney failure: Secondary | ICD-10-CM | POA: Diagnosis not present

## 2016-04-20 DIAGNOSIS — E11319 Type 2 diabetes mellitus with unspecified diabetic retinopathy without macular edema: Secondary | ICD-10-CM | POA: Diagnosis not present

## 2016-04-20 DIAGNOSIS — Z955 Presence of coronary angioplasty implant and graft: Secondary | ICD-10-CM | POA: Diagnosis not present

## 2016-04-21 DIAGNOSIS — I252 Old myocardial infarction: Secondary | ICD-10-CM | POA: Diagnosis not present

## 2016-04-21 DIAGNOSIS — I714 Abdominal aortic aneurysm, without rupture: Secondary | ICD-10-CM | POA: Diagnosis not present

## 2016-04-21 DIAGNOSIS — Z955 Presence of coronary angioplasty implant and graft: Secondary | ICD-10-CM | POA: Diagnosis not present

## 2016-04-21 DIAGNOSIS — I739 Peripheral vascular disease, unspecified: Secondary | ICD-10-CM | POA: Diagnosis not present

## 2016-04-21 DIAGNOSIS — N178 Other acute kidney failure: Secondary | ICD-10-CM | POA: Diagnosis not present

## 2016-04-21 DIAGNOSIS — I6521 Occlusion and stenosis of right carotid artery: Secondary | ICD-10-CM | POA: Diagnosis not present

## 2016-04-21 DIAGNOSIS — I25118 Atherosclerotic heart disease of native coronary artery with other forms of angina pectoris: Secondary | ICD-10-CM | POA: Diagnosis not present

## 2016-04-21 DIAGNOSIS — N189 Chronic kidney disease, unspecified: Secondary | ICD-10-CM | POA: Diagnosis not present

## 2016-04-21 DIAGNOSIS — D649 Anemia, unspecified: Secondary | ICD-10-CM | POA: Diagnosis not present

## 2016-04-21 DIAGNOSIS — R000 Tachycardia, unspecified: Secondary | ICD-10-CM | POA: Diagnosis not present

## 2016-04-21 DIAGNOSIS — I214 Non-ST elevation (NSTEMI) myocardial infarction: Secondary | ICD-10-CM | POA: Diagnosis not present

## 2016-04-22 DIAGNOSIS — I25118 Atherosclerotic heart disease of native coronary artery with other forms of angina pectoris: Secondary | ICD-10-CM | POA: Diagnosis not present

## 2016-04-22 DIAGNOSIS — D649 Anemia, unspecified: Secondary | ICD-10-CM | POA: Diagnosis not present

## 2016-04-22 DIAGNOSIS — I130 Hypertensive heart and chronic kidney disease with heart failure and stage 1 through stage 4 chronic kidney disease, or unspecified chronic kidney disease: Secondary | ICD-10-CM | POA: Diagnosis not present

## 2016-04-22 DIAGNOSIS — R05 Cough: Secondary | ICD-10-CM | POA: Diagnosis not present

## 2016-04-22 DIAGNOSIS — I739 Peripheral vascular disease, unspecified: Secondary | ICD-10-CM | POA: Diagnosis not present

## 2016-04-22 DIAGNOSIS — N178 Other acute kidney failure: Secondary | ICD-10-CM | POA: Diagnosis not present

## 2016-04-22 DIAGNOSIS — I6529 Occlusion and stenosis of unspecified carotid artery: Secondary | ICD-10-CM | POA: Diagnosis not present

## 2016-04-22 DIAGNOSIS — I255 Ischemic cardiomyopathy: Secondary | ICD-10-CM | POA: Diagnosis not present

## 2016-04-22 DIAGNOSIS — I480 Paroxysmal atrial fibrillation: Secondary | ICD-10-CM | POA: Diagnosis not present

## 2016-04-22 DIAGNOSIS — I214 Non-ST elevation (NSTEMI) myocardial infarction: Secondary | ICD-10-CM | POA: Diagnosis not present

## 2016-04-22 DIAGNOSIS — I5022 Chronic systolic (congestive) heart failure: Secondary | ICD-10-CM | POA: Diagnosis not present

## 2016-04-22 DIAGNOSIS — R0602 Shortness of breath: Secondary | ICD-10-CM | POA: Diagnosis not present

## 2016-04-23 DIAGNOSIS — I255 Ischemic cardiomyopathy: Secondary | ICD-10-CM | POA: Diagnosis not present

## 2016-04-23 DIAGNOSIS — N189 Chronic kidney disease, unspecified: Secondary | ICD-10-CM | POA: Diagnosis not present

## 2016-04-23 DIAGNOSIS — I5022 Chronic systolic (congestive) heart failure: Secondary | ICD-10-CM | POA: Diagnosis not present

## 2016-04-23 DIAGNOSIS — I25118 Atherosclerotic heart disease of native coronary artery with other forms of angina pectoris: Secondary | ICD-10-CM | POA: Diagnosis not present

## 2016-04-23 DIAGNOSIS — I739 Peripheral vascular disease, unspecified: Secondary | ICD-10-CM | POA: Diagnosis not present

## 2016-04-23 DIAGNOSIS — E119 Type 2 diabetes mellitus without complications: Secondary | ICD-10-CM | POA: Diagnosis not present

## 2016-04-23 DIAGNOSIS — I214 Non-ST elevation (NSTEMI) myocardial infarction: Secondary | ICD-10-CM | POA: Diagnosis not present

## 2016-04-23 DIAGNOSIS — I480 Paroxysmal atrial fibrillation: Secondary | ICD-10-CM | POA: Diagnosis not present

## 2016-04-23 DIAGNOSIS — I130 Hypertensive heart and chronic kidney disease with heart failure and stage 1 through stage 4 chronic kidney disease, or unspecified chronic kidney disease: Secondary | ICD-10-CM | POA: Diagnosis not present

## 2016-04-26 DIAGNOSIS — M545 Low back pain: Secondary | ICD-10-CM | POA: Diagnosis not present

## 2016-04-26 DIAGNOSIS — E785 Hyperlipidemia, unspecified: Secondary | ICD-10-CM | POA: Diagnosis not present

## 2016-04-26 DIAGNOSIS — I214 Non-ST elevation (NSTEMI) myocardial infarction: Secondary | ICD-10-CM | POA: Diagnosis not present

## 2016-04-26 DIAGNOSIS — R609 Edema, unspecified: Secondary | ICD-10-CM | POA: Diagnosis not present

## 2016-04-26 DIAGNOSIS — K219 Gastro-esophageal reflux disease without esophagitis: Secondary | ICD-10-CM | POA: Diagnosis not present

## 2016-04-26 DIAGNOSIS — R5383 Other fatigue: Secondary | ICD-10-CM | POA: Diagnosis not present

## 2016-04-26 DIAGNOSIS — E119 Type 2 diabetes mellitus without complications: Secondary | ICD-10-CM | POA: Diagnosis not present

## 2016-04-26 DIAGNOSIS — E1140 Type 2 diabetes mellitus with diabetic neuropathy, unspecified: Secondary | ICD-10-CM | POA: Diagnosis not present

## 2016-04-26 DIAGNOSIS — G4700 Insomnia, unspecified: Secondary | ICD-10-CM | POA: Diagnosis not present

## 2016-04-26 DIAGNOSIS — E113299 Type 2 diabetes mellitus with mild nonproliferative diabetic retinopathy without macular edema, unspecified eye: Secondary | ICD-10-CM | POA: Diagnosis not present

## 2016-04-26 DIAGNOSIS — I2510 Atherosclerotic heart disease of native coronary artery without angina pectoris: Secondary | ICD-10-CM | POA: Diagnosis not present

## 2016-04-26 DIAGNOSIS — F411 Generalized anxiety disorder: Secondary | ICD-10-CM | POA: Diagnosis not present

## 2016-04-26 DIAGNOSIS — I10 Essential (primary) hypertension: Secondary | ICD-10-CM | POA: Diagnosis not present

## 2016-04-26 DIAGNOSIS — R809 Proteinuria, unspecified: Secondary | ICD-10-CM | POA: Diagnosis not present

## 2016-05-03 DIAGNOSIS — D649 Anemia, unspecified: Secondary | ICD-10-CM | POA: Diagnosis not present

## 2016-05-03 DIAGNOSIS — R609 Edema, unspecified: Secondary | ICD-10-CM | POA: Diagnosis not present

## 2016-05-03 DIAGNOSIS — R809 Proteinuria, unspecified: Secondary | ICD-10-CM | POA: Diagnosis not present

## 2016-05-03 DIAGNOSIS — M545 Low back pain: Secondary | ICD-10-CM | POA: Diagnosis not present

## 2016-05-03 DIAGNOSIS — E113299 Type 2 diabetes mellitus with mild nonproliferative diabetic retinopathy without macular edema, unspecified eye: Secondary | ICD-10-CM | POA: Diagnosis not present

## 2016-05-03 DIAGNOSIS — F329 Major depressive disorder, single episode, unspecified: Secondary | ICD-10-CM | POA: Diagnosis not present

## 2016-05-03 DIAGNOSIS — E1140 Type 2 diabetes mellitus with diabetic neuropathy, unspecified: Secondary | ICD-10-CM | POA: Diagnosis not present

## 2016-05-03 DIAGNOSIS — I2510 Atherosclerotic heart disease of native coronary artery without angina pectoris: Secondary | ICD-10-CM | POA: Diagnosis not present

## 2016-05-03 DIAGNOSIS — F411 Generalized anxiety disorder: Secondary | ICD-10-CM | POA: Diagnosis not present

## 2016-05-03 DIAGNOSIS — E785 Hyperlipidemia, unspecified: Secondary | ICD-10-CM | POA: Diagnosis not present

## 2016-05-03 DIAGNOSIS — G4700 Insomnia, unspecified: Secondary | ICD-10-CM | POA: Diagnosis not present

## 2016-05-03 DIAGNOSIS — K219 Gastro-esophageal reflux disease without esophagitis: Secondary | ICD-10-CM | POA: Diagnosis not present

## 2016-05-08 ENCOUNTER — Other Ambulatory Visit: Payer: Self-pay

## 2016-05-08 DIAGNOSIS — N183 Chronic kidney disease, stage 3 (moderate): Secondary | ICD-10-CM | POA: Diagnosis not present

## 2016-05-08 DIAGNOSIS — D649 Anemia, unspecified: Secondary | ICD-10-CM | POA: Diagnosis not present

## 2016-05-08 DIAGNOSIS — I2510 Atherosclerotic heart disease of native coronary artery without angina pectoris: Secondary | ICD-10-CM | POA: Diagnosis not present

## 2016-05-08 DIAGNOSIS — I5031 Acute diastolic (congestive) heart failure: Secondary | ICD-10-CM | POA: Diagnosis not present

## 2016-05-08 DIAGNOSIS — Z7901 Long term (current) use of anticoagulants: Secondary | ICD-10-CM | POA: Diagnosis not present

## 2016-05-08 DIAGNOSIS — I480 Paroxysmal atrial fibrillation: Secondary | ICD-10-CM | POA: Diagnosis not present

## 2016-05-08 DIAGNOSIS — I214 Non-ST elevation (NSTEMI) myocardial infarction: Secondary | ICD-10-CM | POA: Diagnosis not present

## 2016-05-08 DIAGNOSIS — I110 Hypertensive heart disease with heart failure: Secondary | ICD-10-CM | POA: Diagnosis not present

## 2016-05-08 DIAGNOSIS — Z79899 Other long term (current) drug therapy: Secondary | ICD-10-CM | POA: Diagnosis not present

## 2016-05-08 NOTE — Patient Outreach (Signed)
[facility]) Care Management  05/08/2016  James Chan 04/25/1950 [id number]     Transition of Care Referral  Referral Date: 05/05/16 Referral Source: [facility] Discharge Report Date of Discharge: 04/23/17 Facility: [place]: [facility]   Outreach attempt # 1 to patient. Spoke with patient. Social: Patient resides in his home along with his spouse. He states that he independent with ADLs/IADLs. Patient drives himself denies any recent falls. DME in the home includes cane and cbg meter.   Conditions: Patient reports PMH of three mini strokes, CHF, DM, HTN, HLD and three heart attacks. He was discharged from [facility] on 04/23/16. Patient states he was admitted for SOB and anemia. He reports anemia is new diagnosis. While in the hospital he received two units of blood. When he saw cardiologist this morning and had lab work done. He was told that blood count was still slightly below normal. He denies any bleeding or other symptoms. He states that cardiologist told him he will need to wear a 30 day heart monitor and goes next Wednesday to have device placed. Patient states that MD told him he still had a lot of swelling and increased his Lasix. Patient was advised to start mentioning weight and fluid retention. He voices that he does not have a scale in the home and is unable to afford one. BP has been elevated and patient does not have BP monitor. He states that he does monitor cbgs in the home and they have been elevated. Per patient report cbgs ranging in the 190s-200s fasting.   Medications: Patient reported taking about 12 meds. He states that his Eliquis(new med) is expensive. His main concern is affordability of Levamir insulin pen. He asked MD office if they had samples for him but was advised there weren't any. Patient states that he manages his meds independently.  Appointments: Patient saw PCP(dr. Truman Hayward) on last week. He saw cardiologist (Dr. Joylene John) on  this morning.   Advance Directives: unable to address this call.  Consent: [facility] services reviewed and discussed. Patient gave verbal consent for services.  Plan: RN CM will notify [facility] administrative assistant of case status. RN CM will send [facility] community referral for [facility] and further in home eval/assessment along with disease education and support.  RN CM will send [facility] pharamcy referral for possible med assistance.  Enzo Montgomery, RN,BSN,CCM [facility] Management Telephonic Care Management Coordinator Direct Phone: [phone] Toll Free: [phone] Fax: [phone]

## 2016-05-09 ENCOUNTER — Other Ambulatory Visit: Payer: Self-pay | Admitting: *Deleted

## 2016-05-09 ENCOUNTER — Encounter: Payer: Self-pay | Admitting: *Deleted

## 2016-05-09 DIAGNOSIS — I10 Essential (primary) hypertension: Secondary | ICD-10-CM

## 2016-05-09 DIAGNOSIS — I739 Peripheral vascular disease, unspecified: Secondary | ICD-10-CM

## 2016-05-09 NOTE — Patient Outreach (Addendum)
[facility]) Care Management  05/09/2016  James Chan March 15, 1951 [id number]  New Referral to Community care received 05/09/16  Initial Transition of care call   Spoke with patient, explained reason for call and transition of care program. Patient discussed his recent hospital admission for chest pain, and PMH of MI  mini strokes,DM,HTN, CHF,PAD and atrial fibrillation. Patient reports he has seen PCP Dr.Lee for post hospital visit and recent visit for chronic back pain as well as follow up visit with Dr.Munley, cardiologist that advised him on increasing lasix as he still had extra fluid and plans for 30 day monitor to be placed at office visit in next week.  Patient states he does not have scales to monitor his weight, and unable to purchase ,"times are a little rough" Patient denies chest pain , shortness of breath, or heart palpitations , reports some swelling in legs. Patient has nitroglycerin denies having to use since at home.  Patient reports he uses a cane at times .   Patient monitors his blood sugars daily at home,today's reading 200  discussed cost concerns related to medications  levemir insulin $100 for supply that last a little over a month, and eliquis $44. Patient currently has all of prescribed medications  Patient manages his medication at home with a little reminding from his wife. Patient was recently discharged from hospital and all medications have been reviewed.   Plan Will arrange [facility] resources for scales to be sent to patient, evaluate resources for supplying blood pressure monitor at time of visit.  Placed call to [place] regarding recent office visit records being sent to [facility] Will continue with weekly transition of care call, next call in a week, and have scheduled initial transition of care visit  for next 2 weeks.  Will send PCP barrier letter .  Care planning and patient centered goal setting.     [facility] CM Care Plan Problem One   Flowsheet Row  Most Recent Value  Care Plan Problem One  Recent hospital admission related to cardiac condition at risk for readmission  Role Documenting the Problem One  Care Management [place] for Problem One  Active  [facility]N Long Term Goal (31-90 days)  Patient will not experience a hospital admission in the next 60 days   [facility]N Long Term Goal Start Date  05/10/16  Interventions for Problem One Long Term Goal  Discussed transition of care program weekly outreaches, reinforced taking medications as prescribed, timely follow up with PCP for concerns   [facility] CM Short Term Goal #1 (0-30 days)  Patient will begin to weigh daily and record in the next 30 days  [facility] CM Short Term Goal #1 Start Date  05/10/16  Interventions for Short Term Goal #1  Contacted [facility] regarding scales being sent to patient, discussed weighing daily, in morning type of clothing.   [facility]N CM Short Term Goal #2 (0-30 days)  Patient will begin to monitor and record blood pressue daily in the next 30 days   [facility]N CM Short Term Goal #2 Start Date  05/10/16  Interventions for Short Term Goal #2  Will evaluate [facility] resources for supplying blood pressue monitoring.   THN CM Short Term Goal #3 (0-30 days)  Patient will be able to report increased knowledge related to atrial fibrillation management in the next the 30 days   [facility]N CM Short Term Goal #3 Start Date  05/10/16  Interventions for Short Tern Goal #3  Discussed importance of notifying  MD for dizziness, feelings of heart racing, taking medication as prescribed.         Joylene Draft, RN, [place] Management [phone]- Mobile [phone]- Toll Free Main Office

## 2016-05-10 ENCOUNTER — Encounter: Payer: Self-pay | Admitting: *Deleted

## 2016-05-10 DIAGNOSIS — I739 Peripheral vascular disease, unspecified: Secondary | ICD-10-CM | POA: Insufficient documentation

## 2016-05-10 DIAGNOSIS — I10 Essential (primary) hypertension: Secondary | ICD-10-CM | POA: Insufficient documentation

## 2016-05-10 HISTORY — DX: Peripheral vascular disease, unspecified: I73.9

## 2016-05-10 HISTORY — DX: Essential (primary) hypertension: I10

## 2016-05-15 DIAGNOSIS — I480 Paroxysmal atrial fibrillation: Secondary | ICD-10-CM | POA: Diagnosis not present

## 2016-05-16 ENCOUNTER — Other Ambulatory Visit: Payer: Self-pay | Admitting: Pharmacist

## 2016-05-16 NOTE — Patient Outreach (Signed)
[facility]) Care Management  [place]   05/16/2016  Bashir Marchetti [birth year] [id number]  Subjective:  Patient was referred to [facility] by [place] for medication assistance evaluation.  He is currently receiving services from [facility].    Successful phone outreach to patient, HIPAA details verified, purpose of call explained and patient requested call back 45-60 minutes later.   Call placed to patient later per his request, HIPAA again verified.    Patient reports that he currently has [employer]SA Extra Help, he reports partial extra help.    Patient reports he presently has his medications and was willing to review medications over the phone with [facility] Pharmacist.    Patient's PMH is significant for:  Hypertension, type 2 diabetes mellitus, history of myocardial infarction, chronic lower back pain, diabetic neuropathy.    Objective:   Current Medications: Current Outpatient Prescriptions  Medication Sig Dispense Refill  . amiodarone (PACERONE) 400 MG tablet Take 400 mg by mouth daily.    Marland Kitchen amLODipine (NORVASC) 10 MG tablet Take 10 mg by mouth daily.    Marland Kitchen apixaban (ELIQUIS) 5 MG TABS tablet Take 5 mg by mouth 2 (two) times daily.    Marland Kitchen atorvastatin (LIPITOR) 40 MG tablet Take 40 mg by mouth daily.    . clopidogrel (PLAVIX) 75 MG tablet Take 75 mg by mouth daily.    . furosemide (LASIX) 40 MG tablet Take 40 mg by mouth 3 (three) times daily.    Marland Kitchen gabapentin (NEURONTIN) 600 MG tablet Take 300 mg by mouth 2 (two) times daily.     . insulin detemir (LEVEMIR) 100 UNIT/ML injection Inject 55 Units into the skin 2 (two) times daily.    . isosorbide mononitrate (IMDUR) 30 MG 24 hr tablet Take 60 mg by mouth daily.     . metFORMIN (GLUCOPHAGE) 850 MG tablet Take 850 mg by mouth 3 (three) times daily.    . metoprolol (LOPRESSOR) 100 MG tablet Take 100 mg by mouth 2 (two) times daily.    Marland Kitchen oxyCODONE-acetaminophen  (PERCOCET/ROXICET) 5-325 MG tablet Take 1 tablet by mouth every 6 (six) hours as needed for severe pain.    Marland Kitchen losartan (COZAAR) 100 MG tablet Take 100 mg by mouth daily.    . nitroGLYCERIN (NITROSTAT) 0.4 MG SL tablet Place 0.4 mg under the tongue every 5 (five) minutes as needed for chest pain.     No current facility-administered medications for this visit.     Functional Status: In your present state of health, do you have any difficulty performing the following activities: 05/09/2016  Hearing? Y  Vision? Y  Difficulty concentrating or making decisions? N  Walking or climbing stairs? Y  Dressing or bathing? N  Doing errands, shopping? N  Preparing Food and eating ? N  Using the Toilet? N  In the past six months, have you accidently leaked urine? Y  Do you have problems with loss of bowel control? N  Managing your Medications? N  Managing your Finances? N  Housekeeping or managing your Housekeeping? N  Some recent data might be hidden    Fall/Depression Screening: PHQ 2/9 Scores 05/08/2016  PHQ - 2 Score 0    Assessment:  Medication review per patient report, medication list in this chart and medication list from PCP note dated 05/03/16 scanned into this chart.    Drugs sorted by system:  Neurologic/Psychologic: -gabapentin---patient reports 300 mg twice daily (this chart shows 600  mg twice daily)  Cardiovascular: -amiodarone  -amlodipine -apixaban  -atorvastatin -clopidogrel -furosemide -isosorbide mononitrate---patient reports 60 mg daily (this chart shows 30 mg daily)  -metoprolol tartrate -sublingual nitroglycerin  Endocrine: -insulin detemir (Levemir)  -metformin  Pain: -oxycodone/acetaminophen  Other issues noted:  -Patient states he is not taking losartan at this time---it is on medication list in this chart and medication list from 05/03/16 PCP note received by [facility] RN Community [place].     -Metformin: Suggest close monitoring of renal function.   Consider dose reduction if renal function remains impaired.      Medication assistance: As patient reports he has SSA Extra Help, partial extra help, patient assistance options are limited.    Counseled patient he may have a deductible to meet in 2018, then his co-pays should be the co-pay, or 15% co-insurance whichever is lesser.    Counseled patient there are alternative agents to Eliquis such as warfarin that he could discuss with his prescriber, however warfarin requires routine blood monitoring to ensure appropriate dosage.    If Levemir becomes too expensive, counseled patient he could discuss Reli-On brand Novolin N with his prescriber, which is ~$25/vial.    Plan:  Will route note to PCP for review.    Patient provided [place] Pharmacist phone number should he have new pharmacy related questions/concerns.    Will not open pharmacy case at this time.   Will update [place].    Karrie Meres, PharmD, [place] [phone]

## 2016-05-17 DIAGNOSIS — E875 Hyperkalemia: Secondary | ICD-10-CM | POA: Diagnosis not present

## 2016-05-17 DIAGNOSIS — I480 Paroxysmal atrial fibrillation: Secondary | ICD-10-CM | POA: Diagnosis not present

## 2016-05-18 ENCOUNTER — Other Ambulatory Visit: Payer: Self-pay | Admitting: *Deleted

## 2016-05-18 NOTE — Patient Outreach (Signed)
[place] [facility]) Care Management  05/18/2016  James Chan Sep 05, 1950 [id number]  Transition of care call  Spoke with patient reports he is doing fairly well on today. Patient denies chest pain, shortness of breath, swelling or fast heart beat.  Patient discussed that he has completed 48 hours heart monitor test and will follow up with cardiology in the next 2 weeks. Patient discussed his blood sugar reading is 225 and that it usually runs in that range.   Patient verifies he has received scales via mail and began to track weights daily.   No new concerns at this time  Plan Will plan home visit in next week for transition of care home visit and education on self care management skills  Joylene Draft, RN, [place] Management [phone]- Mobile [phone]- [place] .

## 2016-05-22 ENCOUNTER — Other Ambulatory Visit: Payer: Self-pay | Admitting: *Deleted

## 2016-05-22 NOTE — Patient Outreach (Signed)
[place] [facility]) Care Management   05/22/2016  James Chan 15-Dec-1950 [id number]  James Chan is an 66 y.o. male  Subjective:  Patient discussed he is doing pretty good on today. Patient discussed his recent diagnosis of atrial fibrillation and new medication he is taking . Patient denies shortness,dizziness or fast heart beat.  Patient discussed that he started going to the gym to use his silver fit benefit, walked on treadmill for 3 minutes. Patient denies chest pain , keeps his nitroglycerin in his pocket.  Patient discussed weighing daily since he has new scales, no sudden increases in weight,or swelling noted. Patient discussed he takes his medication as prescribed.   Patient states he has a new blood sugar meter and has just started using it discussed that his A1c has been around 7.    Objective:  BP (!) 150/76 (BP Location: Left Arm, Patient Position: Sitting)   Pulse (!) 50   Resp 18   Wt 200 lb 6.4 oz (90.9 kg)   SpO2 97%   BMI 27.18 kg/m   Ambulating independently in home during visit.  Review of Systems  Constitutional: Negative.   HENT: Negative.   Eyes: Negative.   Respiratory: Negative.   Cardiovascular: Negative.   Gastrointestinal: Negative.   Genitourinary: Negative.   Musculoskeletal: Negative.   Skin: Negative.   Neurological: Negative.  Negative for dizziness.  Endo/Heme/Allergies: Negative.   Psychiatric/Behavioral: Negative.     Physical Exam  Constitutional: He is oriented to person, place, and time. He appears well-developed and well-nourished.  Cardiovascular: Normal rate, regular rhythm and normal heart sounds.   Respiratory: Effort normal.  GI: Soft.  Neurological: He is alert and oriented to person, place, and time.  Skin: Skin is warm and dry.  Psychiatric: He has a normal mood and affect. His behavior is normal. Judgment and thought content normal.    Encounter Medications:   Outpatient Encounter Prescriptions as  of 05/22/2016  Medication Sig Note  . amiodarone (PACERONE) 400 MG tablet Take 400 mg by mouth daily.   Marland Kitchen amLODipine (NORVASC) 10 MG tablet Take 10 mg by mouth daily.   Marland Kitchen apixaban (ELIQUIS) 5 MG TABS tablet Take 5 mg by mouth 2 (two) times daily.   Marland Kitchen atorvastatin (LIPITOR) 40 MG tablet Take 40 mg by mouth daily.   . clopidogrel (PLAVIX) 75 MG tablet Take 75 mg by mouth daily.   . furosemide (LASIX) 40 MG tablet Take 40 mg by mouth 3 (three) times daily. 05/16/2016: Patient reports he is now taking 1 tab twice daily.    Marland Kitchen gabapentin (NEURONTIN) 600 MG tablet Take 300 mg by mouth 2 (two) times daily.  05/17/2016: Per patient report.   . insulin detemir (LEVEMIR) 100 UNIT/ML injection Inject 55 Units into the skin 2 (two) times daily.   . isosorbide mononitrate (IMDUR) 30 MG 24 hr tablet Take 60 mg by mouth daily.  05/17/2016: Per patient report.    Marland Kitchen losartan (COZAAR) 100 MG tablet Take 100 mg by mouth daily.   . metFORMIN (GLUCOPHAGE) 850 MG tablet Take 850 mg by mouth 3 (three) times daily.   . metoprolol (LOPRESSOR) 100 MG tablet Take 100 mg by mouth 2 (two) times daily.   . nitroGLYCERIN (NITROSTAT) 0.4 MG SL tablet Place 0.4 mg under the tongue every 5 (five) minutes as needed for chest pain.   Marland Kitchen oxyCODONE-acetaminophen (PERCOCET/ROXICET) 5-325 MG tablet Take 1 tablet by mouth every 6 (six) hours as needed for severe pain.  No facility-administered encounter medications on file as of 05/22/2016.     Functional Status:   In your present state of health, do you have any difficulty performing the following activities: 05/09/2016  Hearing? Y  Vision? Y  Difficulty concentrating or making decisions? N  Walking or climbing stairs? Y  Dressing or bathing? N  Doing errands, shopping? N  Preparing Food and eating ? N  Using the Toilet? N  In the past six months, have you accidently leaked urine? Y  Do you have problems with loss of bowel control? N  Managing your Medications? N  Managing your  Finances? N  Housekeeping or managing your Housekeeping? N  Some recent data might be hidden    Fall/Depression Screening:    PHQ 2/9 Scores 05/08/2016  PHQ - 2 Score 0   Fall Risk  05/08/2016  Falls in the past year? No    Assessment:  Initial transition of care home visit   Atrial Fibrillation -  Will benefit on signs and symptoms action plan for atrial fibrillation and stroke.   History of Chest pain - patient able  to state how to take nitroglycerin as needed.   Heart Failure- weighing daily, no increases of weight in 2 or 5 days, no shortness of breath , or increased swelling. Patient indicates he's in green zone once zones reviewed.   Diabetes -  Reviewed new meter only one reading , patient denies having any episodes of hypoglycemia reports reading 200 and under. Tolerating beginning slow with walking on treadmill.    Hypertension -  Blood pressure monitor provided, will provide education on monitor. Will benefit from low salt diet review. Patient using Nu salt at times on foods reviewed contents of extra potassium to be aware of .     Plan:  Provided with [facility] atrial fib book, with review  Provided and reviewed  EMMI handout on keeping track of weight, and When to call MD for COPD Reviewed [facility] High/Low salt education handout with patient and sodium limits and reading a label.  Patient centered care plan and goal setting at visit. Will place call to patient in the next week as part of transition of care weekly outreach Will provide patient with pill organizer for managing medication .     [facility] CM Care Plan Problem One   Flowsheet Row Most Recent Value  Care Plan Problem One  Recent hospital admission related to cardiac condition at risk for readmission  Role Documenting the Problem One  Care Management [place] for Problem One  Active  [facility] Long Term Goal (31-90 days)  Patient will not experience a hospital admission in the next 60 days   [facility] Long Term Goal  Start Date  05/10/16  Interventions for Problem One Long Term Goal  Discussed importance of notifying MD of new worsening of symptoms   [facility] CM Short Term Goal #1 (0-30 days)  Patient will begin to weigh daily and record in the next 30 days  [facility] CM Short Term Goal #1 Start Date  05/10/16  Interventions for Short Term Goal #1  Provided [facility] calendar with instruction on keeping record,   [facility] CM Short Term Goal #2 (0-30 days)  Patient will begin to monitor and record blood pressue daily in the next 30 days   [facility] CM Short Term Goal #2 Start Date  05/10/16  Interventions for Short Term Goal #2  Provided education on use of blood pressure monitor, and allowed to return  demonstration .   [facility] CM Short Term Goal #3 (0-30 days)  Patient will be able to report increased knowledge related to atrial fibrillation management in the next the 30 days   [facility] CM Short Term Goal #3 Start Date  05/10/16  Interventions for Short Tern Goal #3  Provided [facility] book on atrial fib, reviewed symptoms of atrial fib and stroke symptoms and action plan   [facility] CM Short Term Goal #4 (0-30 days)  Patient will be able to state worsening signs of heart failure and action to take in the next 30 days   [facility] CM Short Term Goal #4 Start Date  05/22/16  Interventions for Short Term Goal #4  Reviewed Zone chart in [facility] book, and reviewed when to call MD EMMI handout      Joylene Draft, RN, [place] Management [phone]- Mobile [phone]- [place] Office

## 2016-05-24 DIAGNOSIS — E1140 Type 2 diabetes mellitus with diabetic neuropathy, unspecified: Secondary | ICD-10-CM | POA: Diagnosis not present

## 2016-05-24 DIAGNOSIS — F411 Generalized anxiety disorder: Secondary | ICD-10-CM | POA: Diagnosis not present

## 2016-05-24 DIAGNOSIS — R809 Proteinuria, unspecified: Secondary | ICD-10-CM | POA: Diagnosis not present

## 2016-05-24 DIAGNOSIS — R609 Edema, unspecified: Secondary | ICD-10-CM | POA: Diagnosis not present

## 2016-05-24 DIAGNOSIS — G4700 Insomnia, unspecified: Secondary | ICD-10-CM | POA: Diagnosis not present

## 2016-05-24 DIAGNOSIS — E785 Hyperlipidemia, unspecified: Secondary | ICD-10-CM | POA: Diagnosis not present

## 2016-05-24 DIAGNOSIS — I10 Essential (primary) hypertension: Secondary | ICD-10-CM | POA: Diagnosis not present

## 2016-05-24 DIAGNOSIS — F329 Major depressive disorder, single episode, unspecified: Secondary | ICD-10-CM | POA: Diagnosis not present

## 2016-05-24 DIAGNOSIS — E113299 Type 2 diabetes mellitus with mild nonproliferative diabetic retinopathy without macular edema, unspecified eye: Secondary | ICD-10-CM | POA: Diagnosis not present

## 2016-05-24 DIAGNOSIS — I2510 Atherosclerotic heart disease of native coronary artery without angina pectoris: Secondary | ICD-10-CM | POA: Diagnosis not present

## 2016-05-24 DIAGNOSIS — M545 Low back pain: Secondary | ICD-10-CM | POA: Diagnosis not present

## 2016-05-24 DIAGNOSIS — K219 Gastro-esophageal reflux disease without esophagitis: Secondary | ICD-10-CM | POA: Diagnosis not present

## 2016-05-30 ENCOUNTER — Ambulatory Visit: Payer: Self-pay | Admitting: *Deleted

## 2016-05-30 ENCOUNTER — Other Ambulatory Visit: Payer: Self-pay | Admitting: *Deleted

## 2016-05-30 DIAGNOSIS — I110 Hypertensive heart disease with heart failure: Secondary | ICD-10-CM | POA: Diagnosis not present

## 2016-05-30 DIAGNOSIS — Z7901 Long term (current) use of anticoagulants: Secondary | ICD-10-CM | POA: Diagnosis not present

## 2016-05-30 DIAGNOSIS — I5031 Acute diastolic (congestive) heart failure: Secondary | ICD-10-CM | POA: Diagnosis not present

## 2016-05-30 DIAGNOSIS — Z79899 Other long term (current) drug therapy: Secondary | ICD-10-CM | POA: Diagnosis not present

## 2016-05-30 DIAGNOSIS — D649 Anemia, unspecified: Secondary | ICD-10-CM | POA: Diagnosis not present

## 2016-05-30 DIAGNOSIS — I2510 Atherosclerotic heart disease of native coronary artery without angina pectoris: Secondary | ICD-10-CM | POA: Diagnosis not present

## 2016-05-30 DIAGNOSIS — I480 Paroxysmal atrial fibrillation: Secondary | ICD-10-CM | POA: Diagnosis not present

## 2016-05-30 NOTE — Patient Outreach (Signed)
[place] [facility]) Care Management  05/30/2016  Forrest Jaroszewski 1951/01/13 [id number]  Transition of care call Placed call to patient , no answer able to leave a hipaa compliant message with my return call number.  Plan Will await return call , if no response will make telephonic outreach in the next week as part of transition of care    Joylene Draft, RN, [place] Management [phone]- Mobile [phone]- [place]

## 2016-06-06 ENCOUNTER — Other Ambulatory Visit: Payer: Self-pay | Admitting: *Deleted

## 2016-06-06 NOTE — Patient Outreach (Signed)
[facility]) Care Management  06/06/2016  Rudy Luhmann 1951-01-08 [id number]  Transition of care call  Spoke with patient reports he is doing good so far in this week. Patient discussed his recent visits to PCP and cardiology office, states medication changes made due to his low heart rate and increased swelling in legs.   Patient continues to monitor his blood pressure heart rate, weights daily. Patient denies shortness of breath increased swelling. Today's weight is 201, not weight increase, blood sugar average 118. Patient reports he continues to limit salt in his diet.  Patient reports his blood pressure has ranged in the 130-150/range and heart rate 50 to 68. Patient continues to go to gym 3 days a week to walk on the treadmill for about 30 minutes.  Patient denies any new concerns at this time  Plan Will follow up with weekly transition of care call in the next week.   Joylene Draft, RN, [place] Management [phone]- Mobile [phone]- Toll Free Main Office

## 2016-06-13 ENCOUNTER — Other Ambulatory Visit: Payer: Self-pay | Admitting: *Deleted

## 2016-06-13 NOTE — Patient Outreach (Addendum)
[name]) Care Management  06/13/2016  James Chan [birth year] [id number]  Transition of care call  Spoke with patient reports that he is doing well. He continues to weight daily, reports no increase in weight or swelling, weight today is 200, total of 7 pounds down since he began weighing on a regular basis.Patient reports decrease in swelling in legs.  Patient continues to monitor blood pressure/high rate daily with reading 144/70's range and heart rate 57 to 60. Patient discussed blood sugar range has been 110-120 range. Patient discussed he continues to walk 3 days a week at the gym and tolerating well.  Patient has completed 31 day of transition of care program will continue to follow for complex case management and education needs.   Plan Will provide patient written information on diabetes/heart healthy diet.  Will plan follow up telephone visit in the next month.   Joylene Draft, RN, [place] Management [phone]- Mobile [phone]- Toll Free Main Office

## 2016-06-15 DIAGNOSIS — H401131 Primary open-angle glaucoma, bilateral, mild stage: Secondary | ICD-10-CM | POA: Diagnosis not present

## 2016-06-21 DIAGNOSIS — Z79891 Long term (current) use of opiate analgesic: Secondary | ICD-10-CM | POA: Diagnosis not present

## 2016-06-21 DIAGNOSIS — I2510 Atherosclerotic heart disease of native coronary artery without angina pectoris: Secondary | ICD-10-CM | POA: Diagnosis not present

## 2016-06-21 DIAGNOSIS — R5383 Other fatigue: Secondary | ICD-10-CM | POA: Diagnosis not present

## 2016-06-21 DIAGNOSIS — E113299 Type 2 diabetes mellitus with mild nonproliferative diabetic retinopathy without macular edema, unspecified eye: Secondary | ICD-10-CM | POA: Diagnosis not present

## 2016-06-21 DIAGNOSIS — E1140 Type 2 diabetes mellitus with diabetic neuropathy, unspecified: Secondary | ICD-10-CM | POA: Diagnosis not present

## 2016-06-21 DIAGNOSIS — D649 Anemia, unspecified: Secondary | ICD-10-CM | POA: Diagnosis not present

## 2016-06-21 DIAGNOSIS — R809 Proteinuria, unspecified: Secondary | ICD-10-CM | POA: Diagnosis not present

## 2016-06-21 DIAGNOSIS — M545 Low back pain: Secondary | ICD-10-CM | POA: Diagnosis not present

## 2016-06-21 DIAGNOSIS — R609 Edema, unspecified: Secondary | ICD-10-CM | POA: Diagnosis not present

## 2016-06-21 DIAGNOSIS — E785 Hyperlipidemia, unspecified: Secondary | ICD-10-CM | POA: Diagnosis not present

## 2016-06-21 DIAGNOSIS — K219 Gastro-esophageal reflux disease without esophagitis: Secondary | ICD-10-CM | POA: Diagnosis not present

## 2016-06-21 DIAGNOSIS — G4700 Insomnia, unspecified: Secondary | ICD-10-CM | POA: Diagnosis not present

## 2016-06-27 ENCOUNTER — Ambulatory Visit: Payer: PPO | Admitting: *Deleted

## 2016-06-28 DIAGNOSIS — E119 Type 2 diabetes mellitus without complications: Secondary | ICD-10-CM | POA: Diagnosis not present

## 2016-06-28 DIAGNOSIS — E1140 Type 2 diabetes mellitus with diabetic neuropathy, unspecified: Secondary | ICD-10-CM | POA: Diagnosis not present

## 2016-06-28 DIAGNOSIS — E785 Hyperlipidemia, unspecified: Secondary | ICD-10-CM | POA: Diagnosis not present

## 2016-06-28 DIAGNOSIS — G4700 Insomnia, unspecified: Secondary | ICD-10-CM | POA: Diagnosis not present

## 2016-06-28 DIAGNOSIS — R809 Proteinuria, unspecified: Secondary | ICD-10-CM | POA: Diagnosis not present

## 2016-06-28 DIAGNOSIS — I2510 Atherosclerotic heart disease of native coronary artery without angina pectoris: Secondary | ICD-10-CM | POA: Diagnosis not present

## 2016-06-28 DIAGNOSIS — E113299 Type 2 diabetes mellitus with mild nonproliferative diabetic retinopathy without macular edema, unspecified eye: Secondary | ICD-10-CM | POA: Diagnosis not present

## 2016-06-28 DIAGNOSIS — R609 Edema, unspecified: Secondary | ICD-10-CM | POA: Diagnosis not present

## 2016-06-28 DIAGNOSIS — I10 Essential (primary) hypertension: Secondary | ICD-10-CM | POA: Diagnosis not present

## 2016-06-28 DIAGNOSIS — N289 Disorder of kidney and ureter, unspecified: Secondary | ICD-10-CM | POA: Diagnosis not present

## 2016-06-28 DIAGNOSIS — K219 Gastro-esophageal reflux disease without esophagitis: Secondary | ICD-10-CM | POA: Diagnosis not present

## 2016-06-28 DIAGNOSIS — M545 Low back pain: Secondary | ICD-10-CM | POA: Diagnosis not present

## 2016-07-04 ENCOUNTER — Other Ambulatory Visit: Payer: Self-pay | Admitting: *Deleted

## 2016-07-04 NOTE — Patient Outreach (Signed)
[place] [facility]) Care Management  07/04/2016  Zoe Nordin Nov 07, 1950 [id number]  Telephone follow up.  Spoke with patient reports his is doing fairly well. Patient discussed he is taking medications as prescribed.  Patient continues to weigh daily and weights range staying around 200 without increases greater than 3 pounds in a day or 5 in week. Patient denies shortness of breath , he continues to tolerate going to gym 3 days a week to walk on treadmill.   Patient reports he continues to monitor blood pressure , and staying at 140/60 range.  Patient denies any new concerns at this time, he did receive education material on diet information by mail.  Patient reports blood sugars staying in 110 - 140 range.   Plan Will review diet education information. Will plan call in the next week and case closure if no new community care needs identified and 60 day transition of care will be completed patient in agreement .    Joylene Draft, RN, [place] Management [phone]- Mobile [phone]- Toll Free Main Office

## 2016-07-11 ENCOUNTER — Encounter: Payer: Self-pay | Admitting: *Deleted

## 2016-07-11 ENCOUNTER — Other Ambulatory Visit: Payer: Self-pay | Admitting: *Deleted

## 2016-07-11 NOTE — Patient Outreach (Signed)
[place] [facility]) Care Management  07/11/2016  Troy Kanouse 04/25/1950 [id number]   Transition of care case closure call  Spoke with patient reports that he is doing well.  Heart failure  Patient discussed that he continues to monitor and record weights daily , denies sudden increases of weight gain, no increase in shortness of breath or swelling, still has some swelling in legs.   Hypertension Patient continues to monitor daily blood pressure reading , and taking medications as prescribed, will begin to use a pill organizer. Report limiting salt in diet. Patient continues to exercise at gym 3 days a week walking on treadmill.. Patient denies having any chest pain or discomfort.   Diabetes Continues to monitor readings daily,reports averages less than 170 and no reports of hypoglycemia.  Patient has consistent follow up with PCP and cardiology, no hospital readmissions or ED visit . Patient has completed 60 day transition of care, goals met , patient reports he is managing well and denies any new concerns, patient agreeable to case closure at this time. Discussed patient to contact us if future needs arise.   Plan  Will close case to community care management  and send MD case closure letter.   Joylene Draft, RN, [place] Management [phone]- Mobile [phone]- Toll Free Main Office

## 2016-07-19 DIAGNOSIS — R809 Proteinuria, unspecified: Secondary | ICD-10-CM | POA: Diagnosis not present

## 2016-07-19 DIAGNOSIS — G4700 Insomnia, unspecified: Secondary | ICD-10-CM | POA: Diagnosis not present

## 2016-07-19 DIAGNOSIS — F411 Generalized anxiety disorder: Secondary | ICD-10-CM | POA: Diagnosis not present

## 2016-07-19 DIAGNOSIS — E113299 Type 2 diabetes mellitus with mild nonproliferative diabetic retinopathy without macular edema, unspecified eye: Secondary | ICD-10-CM | POA: Diagnosis not present

## 2016-07-19 DIAGNOSIS — I10 Essential (primary) hypertension: Secondary | ICD-10-CM | POA: Diagnosis not present

## 2016-07-19 DIAGNOSIS — E119 Type 2 diabetes mellitus without complications: Secondary | ICD-10-CM | POA: Diagnosis not present

## 2016-07-19 DIAGNOSIS — E1140 Type 2 diabetes mellitus with diabetic neuropathy, unspecified: Secondary | ICD-10-CM | POA: Diagnosis not present

## 2016-07-19 DIAGNOSIS — E785 Hyperlipidemia, unspecified: Secondary | ICD-10-CM | POA: Diagnosis not present

## 2016-07-19 DIAGNOSIS — K219 Gastro-esophageal reflux disease without esophagitis: Secondary | ICD-10-CM | POA: Diagnosis not present

## 2016-07-19 DIAGNOSIS — R609 Edema, unspecified: Secondary | ICD-10-CM | POA: Diagnosis not present

## 2016-07-19 DIAGNOSIS — M545 Low back pain: Secondary | ICD-10-CM | POA: Diagnosis not present

## 2016-07-19 DIAGNOSIS — I2510 Atherosclerotic heart disease of native coronary artery without angina pectoris: Secondary | ICD-10-CM | POA: Diagnosis not present

## 2016-07-20 DIAGNOSIS — D649 Anemia, unspecified: Secondary | ICD-10-CM | POA: Diagnosis not present

## 2016-07-20 DIAGNOSIS — I480 Paroxysmal atrial fibrillation: Secondary | ICD-10-CM | POA: Diagnosis not present

## 2016-07-20 DIAGNOSIS — I110 Hypertensive heart disease with heart failure: Secondary | ICD-10-CM | POA: Diagnosis not present

## 2016-07-20 DIAGNOSIS — N183 Chronic kidney disease, stage 3 (moderate): Secondary | ICD-10-CM | POA: Diagnosis not present

## 2016-07-20 DIAGNOSIS — Z7901 Long term (current) use of anticoagulants: Secondary | ICD-10-CM | POA: Diagnosis not present

## 2016-07-20 DIAGNOSIS — Z79899 Other long term (current) drug therapy: Secondary | ICD-10-CM | POA: Diagnosis not present

## 2016-07-24 DIAGNOSIS — R809 Proteinuria, unspecified: Secondary | ICD-10-CM | POA: Diagnosis not present

## 2016-07-24 DIAGNOSIS — K219 Gastro-esophageal reflux disease without esophagitis: Secondary | ICD-10-CM | POA: Diagnosis not present

## 2016-07-24 DIAGNOSIS — R609 Edema, unspecified: Secondary | ICD-10-CM | POA: Diagnosis not present

## 2016-07-24 DIAGNOSIS — I2510 Atherosclerotic heart disease of native coronary artery without angina pectoris: Secondary | ICD-10-CM | POA: Diagnosis not present

## 2016-07-24 DIAGNOSIS — E119 Type 2 diabetes mellitus without complications: Secondary | ICD-10-CM | POA: Diagnosis not present

## 2016-07-24 DIAGNOSIS — G4700 Insomnia, unspecified: Secondary | ICD-10-CM | POA: Diagnosis not present

## 2016-07-24 DIAGNOSIS — M545 Low back pain: Secondary | ICD-10-CM | POA: Diagnosis not present

## 2016-07-24 DIAGNOSIS — D649 Anemia, unspecified: Secondary | ICD-10-CM | POA: Diagnosis not present

## 2016-07-24 DIAGNOSIS — E785 Hyperlipidemia, unspecified: Secondary | ICD-10-CM | POA: Diagnosis not present

## 2016-07-24 DIAGNOSIS — E1140 Type 2 diabetes mellitus with diabetic neuropathy, unspecified: Secondary | ICD-10-CM | POA: Diagnosis not present

## 2016-07-24 DIAGNOSIS — E113299 Type 2 diabetes mellitus with mild nonproliferative diabetic retinopathy without macular edema, unspecified eye: Secondary | ICD-10-CM | POA: Diagnosis not present

## 2016-07-24 DIAGNOSIS — I10 Essential (primary) hypertension: Secondary | ICD-10-CM | POA: Diagnosis not present

## 2016-07-24 DIAGNOSIS — N289 Disorder of kidney and ureter, unspecified: Secondary | ICD-10-CM | POA: Diagnosis not present

## 2016-07-31 DIAGNOSIS — D509 Iron deficiency anemia, unspecified: Secondary | ICD-10-CM | POA: Diagnosis not present

## 2016-07-31 DIAGNOSIS — M545 Low back pain: Secondary | ICD-10-CM | POA: Diagnosis not present

## 2016-07-31 DIAGNOSIS — G4700 Insomnia, unspecified: Secondary | ICD-10-CM | POA: Diagnosis not present

## 2016-07-31 DIAGNOSIS — E1140 Type 2 diabetes mellitus with diabetic neuropathy, unspecified: Secondary | ICD-10-CM | POA: Diagnosis not present

## 2016-07-31 DIAGNOSIS — K219 Gastro-esophageal reflux disease without esophagitis: Secondary | ICD-10-CM | POA: Diagnosis not present

## 2016-07-31 DIAGNOSIS — R809 Proteinuria, unspecified: Secondary | ICD-10-CM | POA: Diagnosis not present

## 2016-07-31 DIAGNOSIS — I2510 Atherosclerotic heart disease of native coronary artery without angina pectoris: Secondary | ICD-10-CM | POA: Diagnosis not present

## 2016-07-31 DIAGNOSIS — E113299 Type 2 diabetes mellitus with mild nonproliferative diabetic retinopathy without macular edema, unspecified eye: Secondary | ICD-10-CM | POA: Diagnosis not present

## 2016-07-31 DIAGNOSIS — E785 Hyperlipidemia, unspecified: Secondary | ICD-10-CM | POA: Diagnosis not present

## 2016-07-31 DIAGNOSIS — R300 Dysuria: Secondary | ICD-10-CM | POA: Diagnosis not present

## 2016-07-31 DIAGNOSIS — E119 Type 2 diabetes mellitus without complications: Secondary | ICD-10-CM | POA: Diagnosis not present

## 2016-07-31 DIAGNOSIS — I10 Essential (primary) hypertension: Secondary | ICD-10-CM | POA: Diagnosis not present

## 2016-08-07 DIAGNOSIS — Z125 Encounter for screening for malignant neoplasm of prostate: Secondary | ICD-10-CM | POA: Diagnosis not present

## 2016-08-07 DIAGNOSIS — N289 Disorder of kidney and ureter, unspecified: Secondary | ICD-10-CM | POA: Diagnosis not present

## 2016-08-07 DIAGNOSIS — N401 Enlarged prostate with lower urinary tract symptoms: Secondary | ICD-10-CM | POA: Diagnosis not present

## 2016-08-07 DIAGNOSIS — R351 Nocturia: Secondary | ICD-10-CM | POA: Diagnosis not present

## 2016-08-16 DIAGNOSIS — E119 Type 2 diabetes mellitus without complications: Secondary | ICD-10-CM | POA: Diagnosis not present

## 2016-08-16 DIAGNOSIS — M545 Low back pain: Secondary | ICD-10-CM | POA: Diagnosis not present

## 2016-08-16 DIAGNOSIS — E785 Hyperlipidemia, unspecified: Secondary | ICD-10-CM | POA: Diagnosis not present

## 2016-08-16 DIAGNOSIS — E113299 Type 2 diabetes mellitus with mild nonproliferative diabetic retinopathy without macular edema, unspecified eye: Secondary | ICD-10-CM | POA: Diagnosis not present

## 2016-08-16 DIAGNOSIS — I2510 Atherosclerotic heart disease of native coronary artery without angina pectoris: Secondary | ICD-10-CM | POA: Diagnosis not present

## 2016-08-16 DIAGNOSIS — G4700 Insomnia, unspecified: Secondary | ICD-10-CM | POA: Diagnosis not present

## 2016-08-16 DIAGNOSIS — I10 Essential (primary) hypertension: Secondary | ICD-10-CM | POA: Diagnosis not present

## 2016-08-16 DIAGNOSIS — D509 Iron deficiency anemia, unspecified: Secondary | ICD-10-CM | POA: Diagnosis not present

## 2016-08-16 DIAGNOSIS — E1140 Type 2 diabetes mellitus with diabetic neuropathy, unspecified: Secondary | ICD-10-CM | POA: Diagnosis not present

## 2016-08-16 DIAGNOSIS — K219 Gastro-esophageal reflux disease without esophagitis: Secondary | ICD-10-CM | POA: Diagnosis not present

## 2016-08-16 DIAGNOSIS — R609 Edema, unspecified: Secondary | ICD-10-CM | POA: Diagnosis not present

## 2016-08-16 DIAGNOSIS — R809 Proteinuria, unspecified: Secondary | ICD-10-CM | POA: Diagnosis not present

## 2016-08-18 DIAGNOSIS — Z79899 Other long term (current) drug therapy: Secondary | ICD-10-CM | POA: Diagnosis not present

## 2016-08-18 DIAGNOSIS — I110 Hypertensive heart disease with heart failure: Secondary | ICD-10-CM | POA: Diagnosis not present

## 2016-08-18 DIAGNOSIS — I2510 Atherosclerotic heart disease of native coronary artery without angina pectoris: Secondary | ICD-10-CM | POA: Diagnosis not present

## 2016-08-18 DIAGNOSIS — E785 Hyperlipidemia, unspecified: Secondary | ICD-10-CM | POA: Diagnosis not present

## 2016-08-18 DIAGNOSIS — N529 Male erectile dysfunction, unspecified: Secondary | ICD-10-CM | POA: Diagnosis not present

## 2016-08-18 DIAGNOSIS — D649 Anemia, unspecified: Secondary | ICD-10-CM | POA: Diagnosis not present

## 2016-08-18 DIAGNOSIS — I480 Paroxysmal atrial fibrillation: Secondary | ICD-10-CM | POA: Diagnosis not present

## 2016-08-18 DIAGNOSIS — Z7901 Long term (current) use of anticoagulants: Secondary | ICD-10-CM | POA: Diagnosis not present

## 2016-08-25 DIAGNOSIS — D638 Anemia in other chronic diseases classified elsewhere: Secondary | ICD-10-CM | POA: Diagnosis not present

## 2016-09-04 DIAGNOSIS — H1131 Conjunctival hemorrhage, right eye: Secondary | ICD-10-CM | POA: Diagnosis not present

## 2016-09-04 DIAGNOSIS — H401131 Primary open-angle glaucoma, bilateral, mild stage: Secondary | ICD-10-CM | POA: Diagnosis not present

## 2016-09-13 DIAGNOSIS — K219 Gastro-esophageal reflux disease without esophagitis: Secondary | ICD-10-CM | POA: Diagnosis not present

## 2016-09-13 DIAGNOSIS — R5383 Other fatigue: Secondary | ICD-10-CM | POA: Diagnosis not present

## 2016-09-13 DIAGNOSIS — E113299 Type 2 diabetes mellitus with mild nonproliferative diabetic retinopathy without macular edema, unspecified eye: Secondary | ICD-10-CM | POA: Diagnosis not present

## 2016-09-13 DIAGNOSIS — E663 Overweight: Secondary | ICD-10-CM | POA: Diagnosis not present

## 2016-09-13 DIAGNOSIS — I10 Essential (primary) hypertension: Secondary | ICD-10-CM | POA: Diagnosis not present

## 2016-09-13 DIAGNOSIS — G4700 Insomnia, unspecified: Secondary | ICD-10-CM | POA: Diagnosis not present

## 2016-09-13 DIAGNOSIS — R809 Proteinuria, unspecified: Secondary | ICD-10-CM | POA: Diagnosis not present

## 2016-09-13 DIAGNOSIS — E1140 Type 2 diabetes mellitus with diabetic neuropathy, unspecified: Secondary | ICD-10-CM | POA: Diagnosis not present

## 2016-09-13 DIAGNOSIS — M545 Low back pain: Secondary | ICD-10-CM | POA: Diagnosis not present

## 2016-09-13 DIAGNOSIS — E785 Hyperlipidemia, unspecified: Secondary | ICD-10-CM | POA: Diagnosis not present

## 2016-09-13 DIAGNOSIS — I2510 Atherosclerotic heart disease of native coronary artery without angina pectoris: Secondary | ICD-10-CM | POA: Diagnosis not present

## 2016-09-13 DIAGNOSIS — E119 Type 2 diabetes mellitus without complications: Secondary | ICD-10-CM | POA: Diagnosis not present

## 2016-10-19 ENCOUNTER — Encounter: Payer: Self-pay | Admitting: Cardiology

## 2016-10-19 ENCOUNTER — Telehealth: Payer: Self-pay | Admitting: Cardiology

## 2016-10-19 DIAGNOSIS — M545 Low back pain: Secondary | ICD-10-CM | POA: Diagnosis not present

## 2016-10-19 DIAGNOSIS — R809 Proteinuria, unspecified: Secondary | ICD-10-CM | POA: Diagnosis not present

## 2016-10-19 DIAGNOSIS — E039 Hypothyroidism, unspecified: Secondary | ICD-10-CM | POA: Diagnosis not present

## 2016-10-19 DIAGNOSIS — I10 Essential (primary) hypertension: Secondary | ICD-10-CM | POA: Diagnosis not present

## 2016-10-19 DIAGNOSIS — G4700 Insomnia, unspecified: Secondary | ICD-10-CM | POA: Diagnosis not present

## 2016-10-19 DIAGNOSIS — E785 Hyperlipidemia, unspecified: Secondary | ICD-10-CM | POA: Diagnosis not present

## 2016-10-19 DIAGNOSIS — K219 Gastro-esophageal reflux disease without esophagitis: Secondary | ICD-10-CM | POA: Diagnosis not present

## 2016-10-19 DIAGNOSIS — F411 Generalized anxiety disorder: Secondary | ICD-10-CM | POA: Diagnosis not present

## 2016-10-19 DIAGNOSIS — I2510 Atherosclerotic heart disease of native coronary artery without angina pectoris: Secondary | ICD-10-CM | POA: Diagnosis not present

## 2016-10-19 DIAGNOSIS — R609 Edema, unspecified: Secondary | ICD-10-CM | POA: Diagnosis not present

## 2016-10-19 DIAGNOSIS — E113299 Type 2 diabetes mellitus with mild nonproliferative diabetic retinopathy without macular edema, unspecified eye: Secondary | ICD-10-CM | POA: Diagnosis not present

## 2016-10-19 DIAGNOSIS — E1140 Type 2 diabetes mellitus with diabetic neuropathy, unspecified: Secondary | ICD-10-CM | POA: Diagnosis not present

## 2016-10-19 NOTE — Telephone Encounter (Signed)
Closed encounter °

## 2016-11-10 ENCOUNTER — Ambulatory Visit (INDEPENDENT_AMBULATORY_CARE_PROVIDER_SITE_OTHER): Payer: PPO | Admitting: Sports Medicine

## 2016-11-10 VITALS — Ht 72.0 in | Wt 200.0 lb

## 2016-11-10 DIAGNOSIS — M79675 Pain in left toe(s): Secondary | ICD-10-CM | POA: Diagnosis not present

## 2016-11-10 DIAGNOSIS — M79674 Pain in right toe(s): Secondary | ICD-10-CM | POA: Diagnosis not present

## 2016-11-10 DIAGNOSIS — E1142 Type 2 diabetes mellitus with diabetic polyneuropathy: Secondary | ICD-10-CM | POA: Diagnosis not present

## 2016-11-10 DIAGNOSIS — M2041 Other hammer toe(s) (acquired), right foot: Secondary | ICD-10-CM | POA: Diagnosis not present

## 2016-11-10 DIAGNOSIS — B351 Tinea unguium: Secondary | ICD-10-CM | POA: Diagnosis not present

## 2016-11-10 DIAGNOSIS — M2042 Other hammer toe(s) (acquired), left foot: Secondary | ICD-10-CM | POA: Diagnosis not present

## 2016-11-10 NOTE — Progress Notes (Signed)
   Subjective:    Patient ID: James Chan, male    DOB: July 05, 1950, 66 y.o.   MRN: [id number]  HPI  I am needing a new pair of diabetic shoes.      Review of Systems  Eyes: Positive for visual disturbance.  Respiratory: Positive for shortness of breath.   Cardiovascular: Positive for leg swelling.  Musculoskeletal: Positive for back pain, joint swelling and myalgias.  Skin: Positive for wound.  Neurological: Positive for dizziness and headaches.  Hematological: Bruises/bleeds easily.  All other systems reviewed and are negative.      Objective:   Physical Exam        Assessment & Plan:

## 2016-11-10 NOTE — Progress Notes (Signed)
Subjective: James Chan is a 66 y.o. male patient with history of diabetes who presents to office today complaining of long, painful nails  while ambulating in shoes; unable to trim. Patient states that the glucose reading this morning was 126 mg/dl. Patient denies any new changes in medication or new problems.   Desires diabetic shoes.  Patient Active Problem List   Diagnosis Date Noted  . Hypertension 05/10/2016  . 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 Diabetes ([place]) 09/15/2008  . HYPERTRIGLYCERIDEMIA 09/15/2008  . ANXIETY STATE, UNSPECIFIED 09/15/2008  . DEPRESSIVE DISORDER 09/15/2008  . MYOCARDIAL INFARCTION 09/15/2008  . ESOPHAGEAL REFLUX 09/15/2008  . BACK PAIN, LUMBAR, CHRONIC 09/15/2008   Current Outpatient Prescriptions on File Prior to Visit  Medication Sig Dispense Refill  . amiodarone (PACERONE) 400 MG tablet Take 400 mg by mouth daily.    Marland Kitchen amLODipine (NORVASC) 10 MG tablet Take 10 mg by mouth daily.    Marland Kitchen apixaban (ELIQUIS) 5 MG TABS tablet Take 5 mg by mouth 2 (two) times daily.    Marland Kitchen atorvastatin (LIPITOR) 40 MG tablet Take 40 mg by mouth daily.    . clopidogrel (PLAVIX) 75 MG tablet Take 75 mg by mouth daily.    . furosemide (LASIX) 40 MG tablet Take 40 mg by mouth 3 (three) times daily.    Marland Kitchen gabapentin (NEURONTIN) 600 MG tablet Take 300 mg by mouth 2 (two) times daily.     . insulin detemir (LEVEMIR) 100 UNIT/ML injection Inject 55 Units into the skin 2 (two) times daily.    . metFORMIN (GLUCOPHAGE) 850 MG tablet Take 850 mg by mouth 3 (three) times daily.    . metoprolol (LOPRESSOR) 100 MG tablet Take 100 mg by mouth 2 (two) times daily.    . nitroGLYCERIN (NITROSTAT) 0.4 MG SL tablet Place 0.4 mg under the tongue every 5 (five) minutes as needed for chest pain.    Marland Kitchen oxyCODONE-acetaminophen (PERCOCET/ROXICET) 5-325 MG tablet Take 1 tablet by mouth every 6 (six) hours as needed for severe pain.    Marland Kitchen research study medication Take 1 each by  mouth daily.    . isosorbide mononitrate (IMDUR) 30 MG 24 hr tablet Take 60 mg by mouth daily.     Marland Kitchen losartan (COZAAR) 100 MG tablet Take 100 mg by mouth daily.     No current facility-administered medications on file prior to visit.    No Known Allergies  No results found for this or any previous visit (from the past 2160 hour(s)).  Objective: General: Patient is awake, alert, and oriented x 3 and in no acute distress.  Integument: Skin is warm, dry and supple bilateral. Nails are tender, long, thickened and  dystrophic with subungual debris, consistent with onychomycosis, 1-5 bilateral. No signs of infection. No open lesions or preulcerative lesions present bilateral. Remaining integument unremarkable.  Vasculature:  Dorsalis Pedis pulse 1/4 bilateral. Posterior Tibial pulse  1/4 bilateral.  Capillary fill time <3 sec 1-5 bilateral. Positive hair growth to the level of the digits. Temperature gradient within normal limits. No varicosities present bilateral. No edema present bilateral.   Neurology: The patient has absent sensation measured with a 5.07/10g Semmes Weinstein Monofilament at all pedal sites bilateral. Vibratory sensation absent bilateral with tuning fork. No Babinski sign present bilateral.   Musculoskeletal: Hammertoe pedal deformities noted bilateral. Muscular strength 5/5 in all lower extremity muscular groups bilateral without pain on range of motion . No tenderness with calf compression bilateral.  Assessment and Plan: Problem  List Items Addressed This Visit    None    Visit Diagnoses    Pain due to onychomycosis of toenails of both feet    -  Primary   Diabetic polyneuropathy associated with type 2 diabetes mellitus (HCC)       Hammer toes of both feet          -Examined patient. -Discussed and educated patient on diabetic foot care, especially with  regards to the vascular, neurological and musculoskeletal systems.  -Stressed the importance of good glycemic  control and the detriment of not  controlling glucose levels in relation to the foot. -Mechanically debrided all nails 1-5 bilateral using sterile nail nipper and filed with dremel without incident  -Safe step diabetic shoe order form was completed; office to contact primary care for approval / certification;  Office to arrange shoe fitting and dispensing. -Answered all patient questions -Patient to return  in 3 months for at risk foot care -Patient advised to call the office if any problems or questions arise in the meantime.  Landis Martins, DPM

## 2016-11-10 NOTE — Patient Instructions (Signed)

## 2016-11-13 ENCOUNTER — Ambulatory Visit: Payer: PPO | Admitting: Cardiology

## 2016-11-17 DIAGNOSIS — G4700 Insomnia, unspecified: Secondary | ICD-10-CM | POA: Diagnosis not present

## 2016-11-17 DIAGNOSIS — R609 Edema, unspecified: Secondary | ICD-10-CM | POA: Diagnosis not present

## 2016-11-17 DIAGNOSIS — F411 Generalized anxiety disorder: Secondary | ICD-10-CM | POA: Diagnosis not present

## 2016-11-17 DIAGNOSIS — I2510 Atherosclerotic heart disease of native coronary artery without angina pectoris: Secondary | ICD-10-CM | POA: Diagnosis not present

## 2016-11-17 DIAGNOSIS — E113299 Type 2 diabetes mellitus with mild nonproliferative diabetic retinopathy without macular edema, unspecified eye: Secondary | ICD-10-CM | POA: Diagnosis not present

## 2016-11-17 DIAGNOSIS — F329 Major depressive disorder, single episode, unspecified: Secondary | ICD-10-CM | POA: Diagnosis not present

## 2016-11-17 DIAGNOSIS — M545 Low back pain: Secondary | ICD-10-CM | POA: Diagnosis not present

## 2016-11-17 DIAGNOSIS — I10 Essential (primary) hypertension: Secondary | ICD-10-CM | POA: Diagnosis not present

## 2016-11-17 DIAGNOSIS — K219 Gastro-esophageal reflux disease without esophagitis: Secondary | ICD-10-CM | POA: Diagnosis not present

## 2016-11-17 DIAGNOSIS — E039 Hypothyroidism, unspecified: Secondary | ICD-10-CM | POA: Diagnosis not present

## 2016-11-17 DIAGNOSIS — E1140 Type 2 diabetes mellitus with diabetic neuropathy, unspecified: Secondary | ICD-10-CM | POA: Diagnosis not present

## 2016-11-17 DIAGNOSIS — E785 Hyperlipidemia, unspecified: Secondary | ICD-10-CM | POA: Diagnosis not present

## 2016-11-17 DIAGNOSIS — R809 Proteinuria, unspecified: Secondary | ICD-10-CM | POA: Diagnosis not present

## 2016-11-17 DIAGNOSIS — E119 Type 2 diabetes mellitus without complications: Secondary | ICD-10-CM | POA: Diagnosis not present

## 2016-11-28 ENCOUNTER — Encounter: Payer: Self-pay | Admitting: Cardiology

## 2016-11-28 ENCOUNTER — Ambulatory Visit (INDEPENDENT_AMBULATORY_CARE_PROVIDER_SITE_OTHER): Payer: PPO | Admitting: Cardiology

## 2016-11-28 VITALS — BP 150/60 | HR 52 | Ht 72.0 in | Wt 213.1 lb

## 2016-11-28 DIAGNOSIS — Z7901 Long term (current) use of anticoagulants: Secondary | ICD-10-CM | POA: Insufficient documentation

## 2016-11-28 DIAGNOSIS — I480 Paroxysmal atrial fibrillation: Secondary | ICD-10-CM

## 2016-11-28 DIAGNOSIS — I110 Hypertensive heart disease with heart failure: Secondary | ICD-10-CM | POA: Diagnosis not present

## 2016-11-28 DIAGNOSIS — N529 Male erectile dysfunction, unspecified: Secondary | ICD-10-CM

## 2016-11-28 DIAGNOSIS — I119 Hypertensive heart disease without heart failure: Secondary | ICD-10-CM | POA: Insufficient documentation

## 2016-11-28 DIAGNOSIS — E785 Hyperlipidemia, unspecified: Secondary | ICD-10-CM

## 2016-11-28 DIAGNOSIS — Z79899 Other long term (current) drug therapy: Secondary | ICD-10-CM | POA: Diagnosis not present

## 2016-11-28 DIAGNOSIS — I2510 Atherosclerotic heart disease of native coronary artery without angina pectoris: Secondary | ICD-10-CM

## 2016-11-28 DIAGNOSIS — I5042 Chronic combined systolic (congestive) and diastolic (congestive) heart failure: Secondary | ICD-10-CM

## 2016-11-28 DIAGNOSIS — I130 Hypertensive heart and chronic kidney disease with heart failure and stage 1 through stage 4 chronic kidney disease, or unspecified chronic kidney disease: Secondary | ICD-10-CM

## 2016-11-28 DIAGNOSIS — I5032 Chronic diastolic (congestive) heart failure: Secondary | ICD-10-CM

## 2016-11-28 HISTORY — DX: Other long term (current) drug therapy: Z79.899

## 2016-11-28 HISTORY — DX: Hypertensive heart disease with heart failure: I11.0

## 2016-11-28 HISTORY — DX: Chronic combined systolic (congestive) and diastolic (congestive) heart failure: I50.42

## 2016-11-28 HISTORY — DX: Male erectile dysfunction, unspecified: N52.9

## 2016-11-28 HISTORY — DX: Hypertensive heart disease without heart failure: I11.9

## 2016-11-28 MED ORDER — SACUBITRIL-VALSARTAN 97-103 MG PO TABS: 1.0000 | ORAL_TABLET | Freq: Two times a day (BID) | ORAL | Status: DC

## 2016-11-28 MED ORDER — TADALAFIL 10 MG PO TABS: 10.0000 mg | ORAL_TABLET | Freq: Every day | ORAL | 2 refills | Status: DC | PRN

## 2016-11-28 NOTE — Progress Notes (Signed)
Cardiology Office Note:    Date:  11/28/2016   ID:  Haroun Cotham, DOB 09/06/1950, MRN [id number]  PCP:  Cher Nakai, MD  Cardiologist:  Shirlee More, MD    Referring MD: Cher Nakai, MD    ASSESSMENT:    1. Coronary artery disease involving native coronary artery of native heart without angina pectoris   2. Chronic diastolic heart failure ([place])   3. Dyslipidemia   4. Hypertensive heart failure ([place])   5. On amiodarone therapy   6. Chronic anticoagulation   7. Erectile dysfunction, unspecified erectile dysfunction type    PLAN:    In order of problems listed above:  1. Stable continue current medical treatment 2. Stable compensated continue current diuretic 3. Stable continue his high intensity statin weight labs from his PCP for safety liver function LDL efficacy 4. Stable home blood pressures are in range at this time I would not increase his antihypertensive. 5. Stable continue amiodarone I'll ask his PCP to check liver function and TSH every 6 months 6. Stable continues anticoagulant with paroxysmal atrial fibrillation 7. Continue Cialis    Next appointment: 3 months   Medication Adjustments/Labs and Tests Ordered: Current medicines are reviewed at length with the patient today.  Concerns regarding medicines are outlined above.  No orders of the defined types were placed in this encounter.  Meds ordered this encounter  Medications  . tadalafil (CIALIS) 10 MG tablet    Sig: Take 1 tablet (10 mg total) by mouth daily as needed for erectile dysfunction.    Dispense:  5 tablet    Refill:  2    Chief Complaint  Patient presents with  . Follow-up    Routine flup appt  . Edema    History of Present Illness:    Alessandro Griep is a 66 y.o. male with a hx of NSTEMI 04/19/16 with PCI, CAD EF 45% with multiple PCI of RCA for restenosis, brief Paroxysmal Atrial Fibrillation on amiodarone, diastolic CHF with his recent MI, Dyslipidemia, HTN, stage 3 CKD, Peripheral  Vascular Disease, CKD and anemia  last seen three months ago. He continues to feel well is pleased with the quality is life exercises the wide 3 days a week and except for lower extremity weakness from peripheral arterial disease has had no angina dyspnea palpitation or syncope but does have peripheral edema related to his calcium channel blocker. He relates recent CBC renal function lipids were at target copy requested from his PCP Compliance with diet, lifestyle and medications: Yes Past Medical History:  Diagnosis Date  . Anemia   . Anxiety state, unspecified 09/15/2008   Qualifier: Diagnosis of  By: Bullins CMA, Ami    . Arterial insufficiency of lower extremity ([place]) 05/10/2016  . Arthritis   . BACK PAIN, LUMBAR, CHRONIC 09/15/2008   Qualifier: Diagnosis of  By: Bullins CMA, Ami    . CHF (congestive heart failure) ([place])   . Chronic kidney disease   . Claudication ([place]) 10/30/2014  . Comprehensive diabetic foot examination, type 2 DM, encounter for [facility]) 09/15/2008   Qualifier: Diagnosis of  By: Bullins CMA, Ami    . DEPRESSIVE DISORDER 09/15/2008   Qualifier: Diagnosis of  By: Bullins CMA, Ami    . Diabetes mellitus without complication ([place])   . Diabetic polyneuropathy associated with diabetes mellitus due to underlying condition ([place]) 06/28/2015  . Dyslipidemia 11/24/2015  . Esophageal reflux 09/15/2008   Qualifier: Diagnosis of  By: Bullins CMA, Ami    . Glaucoma   .  Hammer toes of both feet 06/28/2015  . History of carotid artery stenosis 10/30/2014  . 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 Hyperlipidemia   . Hypertension   . HYPERTRIGLYCERIDEMIA 09/15/2008   Qualifier: Diagnosis of  By: Bullins CMA, Ami    . Myocardial infarction ([place])   . NSTEMI (non-ST elevated myocardial infarction) ([place]) 09/15/2008   Qualifier: Diagnosis of  By: Bullins CMA, Ami    . Onychomycosis due to dermatophyte 06/28/2015  . PAD (peripheral artery disease) ([place]) 10/30/2014  . Stroke [facility])       Past Surgical History:  Procedure Laterality Date  . angioplasty of iliofemoral and iliac artery with stent placement      Current Medications: Current Meds  Medication Sig  . amiodarone (PACERONE) 400 MG tablet Take 200 mg by mouth daily.   Marland Kitchen amLODipine (NORVASC) 10 MG tablet Take 10 mg by mouth daily.  Marland Kitchen apixaban (ELIQUIS) 5 MG TABS tablet Take 5 mg by mouth 2 (two) times daily.  Marland Kitchen atorvastatin (LIPITOR) 40 MG tablet Take 40 mg by mouth daily.  . clopidogrel (PLAVIX) 75 MG tablet Take 75 mg by mouth daily.  Marland Kitchen gabapentin (NEURONTIN) 600 MG tablet Take 300 mg by mouth 2 (two) times daily.   . insulin detemir (LEVEMIR) 100 UNIT/ML injection Inject 50 Units into the skin 2 (two) times daily.   Marland Kitchen latanoprost (XALATAN) 0.005 % ophthalmic solution   . latanoprost (XALATAN) 0.005 % ophthalmic solution Place 1 drop into both eyes at bedtime.  . metFORMIN (GLUCOPHAGE) 850 MG tablet Take 850 mg by mouth 3 (three) times daily.  . metoprolol (LOPRESSOR) 100 MG tablet Take 100 mg by mouth 2 (two) times daily.  . nitroGLYCERIN (NITROSTAT) 0.4 MG SL tablet Place 0.4 mg under the tongue every 5 (five) minutes as needed for chest pain.  Marland Kitchen oxyCODONE-acetaminophen (PERCOCET/ROXICET) 5-325 MG tablet Take 1 tablet by mouth every 6 (six) hours as needed for severe pain.  Marland Kitchen research study medication Take 1 each by mouth daily.  . tadalafil (CIALIS) 10 MG tablet Take 1 tablet (10 mg total) by mouth daily as needed for erectile dysfunction.  . tamsulosin (FLOMAX) 0.4 MG CAPS capsule   . torsemide (DEMADEX) 20 MG tablet Take 40 mg by mouth 2 (two) times daily.  . [DISCONTINUED] tadalafil (CIALIS) 5 MG tablet Take 5 mg by mouth daily as needed for erectile dysfunction.     Allergies:   Patient has no known allergies.   Social History   Social History  . Marital status: Married    Spouse name: N/A  . Number of children: N/A  . Years of education: N/A   Social History Main Topics  . Smoking status:  Former Smoker    Quit date: 05/09/1994  . Smokeless tobacco: Never Used  . Alcohol use No  . Drug use: No  . Sexual activity: Not Asked   Other Topics Concern  . None   Social History Narrative  . None     Family History: The patient's family history includes Cancer in his father and mother; Heart disease in his father and mother. ROS:   Please see the history of present illness.    All other systems reviewed and are negative.  EKGs/Labs/Other Studies Reviewed:    The following studies were reviewed today:  EKG:  EKG ordered today.  The ekg ordered today demonstrates [facility] NSIVCD old inferior MI  Recent Labs: No results found for requested labs within last 8760 hours.  Recent Lipid Panel No results found for: CHOL, TRIG, HDL, CHOLHDL, VLDL, LDLCALC, LDLDIRECT  Physical Exam:    VS:  BP (!) 150/60 (BP Location: Right Arm, Patient Position: Sitting)   Pulse (!) 52   Ht 6' (1.829 m)   Wt 213 lb 1.3 oz (96.7 kg)   SpO2 97%   BMI 28.90 kg/m     Wt Readings from Last 3 Encounters:  11/28/16 213 lb 1.3 oz (96.7 kg)  11/10/16 200 lb (90.7 kg)  05/22/16 200 lb 6.4 oz (90.9 kg)     GEN:  Well nourished, well developed in no acute distress HEENT: Normal NECK: No JVD; No carotid bruits LYMPHATICS: No lymphadenopathy CARDIAC: RRR, no murmurs, rubs, gallops RESPIRATORY:  Clear to auscultation without rales, wheezing or rhonchi  ABDOMEN: Soft, non-tender, non-distended MUSCULOSKELETAL:  2+ doughy ankle edema -CCB ; No deformity  SKIN: Warm and dry NEUROLOGIC:  Alert and oriented x 3 PSYCHIATRIC:  Normal affect    Signed, Shirlee More, MD  11/28/2016 1:43 PM    [facility]

## 2016-11-28 NOTE — Patient Instructions (Signed)
Medication Instructions:  Your physician recommends that you continue on your current medications as directed. Please refer to the Current Medication list given to you today.   Labwork: None  Testing/Procedures: You had an EKG today.  Follow-Up: Your physician recommends that you schedule a follow-up appointment in: 3 months.   Any Other Special Instructions Will Be Listed Below (If Applicable).     If you need a refill on your cardiac medications before your next appointment, please call your pharmacy.

## 2016-11-29 ENCOUNTER — Telehealth: Payer: Self-pay

## 2016-11-29 NOTE — Telephone Encounter (Signed)
Please advise. Thanks.  

## 2016-11-29 NOTE — Telephone Encounter (Signed)
Patient cant afford ed med called in at appt yesterday, not covered under ins. Please call and advise patient.cn

## 2016-12-05 DIAGNOSIS — H401131 Primary open-angle glaucoma, bilateral, mild stage: Secondary | ICD-10-CM | POA: Diagnosis not present

## 2016-12-18 DIAGNOSIS — E113299 Type 2 diabetes mellitus with mild nonproliferative diabetic retinopathy without macular edema, unspecified eye: Secondary | ICD-10-CM | POA: Diagnosis not present

## 2016-12-18 DIAGNOSIS — E039 Hypothyroidism, unspecified: Secondary | ICD-10-CM | POA: Diagnosis not present

## 2016-12-18 DIAGNOSIS — I2510 Atherosclerotic heart disease of native coronary artery without angina pectoris: Secondary | ICD-10-CM | POA: Diagnosis not present

## 2016-12-18 DIAGNOSIS — M545 Low back pain: Secondary | ICD-10-CM | POA: Diagnosis not present

## 2016-12-18 DIAGNOSIS — E785 Hyperlipidemia, unspecified: Secondary | ICD-10-CM | POA: Diagnosis not present

## 2016-12-18 DIAGNOSIS — R609 Edema, unspecified: Secondary | ICD-10-CM | POA: Diagnosis not present

## 2016-12-18 DIAGNOSIS — G4700 Insomnia, unspecified: Secondary | ICD-10-CM | POA: Diagnosis not present

## 2016-12-18 DIAGNOSIS — K219 Gastro-esophageal reflux disease without esophagitis: Secondary | ICD-10-CM | POA: Diagnosis not present

## 2016-12-18 DIAGNOSIS — E1140 Type 2 diabetes mellitus with diabetic neuropathy, unspecified: Secondary | ICD-10-CM | POA: Diagnosis not present

## 2016-12-18 DIAGNOSIS — F411 Generalized anxiety disorder: Secondary | ICD-10-CM | POA: Diagnosis not present

## 2016-12-18 DIAGNOSIS — R809 Proteinuria, unspecified: Secondary | ICD-10-CM | POA: Diagnosis not present

## 2016-12-18 DIAGNOSIS — N289 Disorder of kidney and ureter, unspecified: Secondary | ICD-10-CM | POA: Diagnosis not present

## 2017-01-05 DIAGNOSIS — E785 Hyperlipidemia, unspecified: Secondary | ICD-10-CM | POA: Diagnosis not present

## 2017-01-05 DIAGNOSIS — E1140 Type 2 diabetes mellitus with diabetic neuropathy, unspecified: Secondary | ICD-10-CM | POA: Diagnosis not present

## 2017-01-05 DIAGNOSIS — R609 Edema, unspecified: Secondary | ICD-10-CM | POA: Diagnosis not present

## 2017-01-05 DIAGNOSIS — M545 Low back pain: Secondary | ICD-10-CM | POA: Diagnosis not present

## 2017-01-05 DIAGNOSIS — N289 Disorder of kidney and ureter, unspecified: Secondary | ICD-10-CM | POA: Diagnosis not present

## 2017-01-05 DIAGNOSIS — R809 Proteinuria, unspecified: Secondary | ICD-10-CM | POA: Diagnosis not present

## 2017-01-05 DIAGNOSIS — K219 Gastro-esophageal reflux disease without esophagitis: Secondary | ICD-10-CM | POA: Diagnosis not present

## 2017-01-05 DIAGNOSIS — E113299 Type 2 diabetes mellitus with mild nonproliferative diabetic retinopathy without macular edema, unspecified eye: Secondary | ICD-10-CM | POA: Diagnosis not present

## 2017-01-05 DIAGNOSIS — I2510 Atherosclerotic heart disease of native coronary artery without angina pectoris: Secondary | ICD-10-CM | POA: Diagnosis not present

## 2017-01-05 DIAGNOSIS — E039 Hypothyroidism, unspecified: Secondary | ICD-10-CM | POA: Diagnosis not present

## 2017-01-05 DIAGNOSIS — G4700 Insomnia, unspecified: Secondary | ICD-10-CM | POA: Diagnosis not present

## 2017-01-05 DIAGNOSIS — E875 Hyperkalemia: Secondary | ICD-10-CM | POA: Diagnosis not present

## 2017-01-08 ENCOUNTER — Other Ambulatory Visit: Payer: Self-pay

## 2017-01-08 ENCOUNTER — Telehealth: Payer: Self-pay | Admitting: Cardiology

## 2017-01-08 MED ORDER — TORSEMIDE 20 MG PO TABS: 40.0000 mg | ORAL_TABLET | Freq: Two times a day (BID) | ORAL | 2 refills | Status: DC

## 2017-01-08 NOTE — Telephone Encounter (Signed)
Please verify his Furosemide directions,please call him.James Chan

## 2017-01-08 NOTE — Telephone Encounter (Signed)
Verified dose of torsemide 40 mg twice daily. Refill sent to [facility] in [place].

## 2017-01-11 ENCOUNTER — Encounter (INDEPENDENT_AMBULATORY_CARE_PROVIDER_SITE_OTHER): Payer: Self-pay

## 2017-01-11 ENCOUNTER — Ambulatory Visit (INDEPENDENT_AMBULATORY_CARE_PROVIDER_SITE_OTHER): Payer: PPO | Admitting: Sports Medicine

## 2017-01-11 DIAGNOSIS — E1142 Type 2 diabetes mellitus with diabetic polyneuropathy: Secondary | ICD-10-CM

## 2017-01-11 DIAGNOSIS — M2042 Other hammer toe(s) (acquired), left foot: Secondary | ICD-10-CM

## 2017-01-11 DIAGNOSIS — M2041 Other hammer toe(s) (acquired), right foot: Secondary | ICD-10-CM

## 2017-01-11 NOTE — Progress Notes (Signed)
Patient ID: James Chan, male   DOB: March 27, 1951, 66 y.o.   MRN: [id number]  Patient presents to be measured for diabetic shoes and inserts with Betha CPed.  Patient measures at 12.5 wide on the brannock device and is box molded.  Patient has chosen [employer] [facility] [id number]. We will call when shoes and inserts arrive

## 2017-01-11 NOTE — Progress Notes (Signed)
Patient discussed with medical assistant. Agree with below. Patient to follow up as scheduled for continued care or sooner if problems or issues arise. -Dr. Cannon Kettle

## 2017-01-17 DIAGNOSIS — R609 Edema, unspecified: Secondary | ICD-10-CM | POA: Diagnosis not present

## 2017-01-17 DIAGNOSIS — R809 Proteinuria, unspecified: Secondary | ICD-10-CM | POA: Diagnosis not present

## 2017-01-17 DIAGNOSIS — E039 Hypothyroidism, unspecified: Secondary | ICD-10-CM | POA: Diagnosis not present

## 2017-01-17 DIAGNOSIS — K219 Gastro-esophageal reflux disease without esophagitis: Secondary | ICD-10-CM | POA: Diagnosis not present

## 2017-01-17 DIAGNOSIS — I2510 Atherosclerotic heart disease of native coronary artery without angina pectoris: Secondary | ICD-10-CM | POA: Diagnosis not present

## 2017-01-17 DIAGNOSIS — G4700 Insomnia, unspecified: Secondary | ICD-10-CM | POA: Diagnosis not present

## 2017-01-17 DIAGNOSIS — E1140 Type 2 diabetes mellitus with diabetic neuropathy, unspecified: Secondary | ICD-10-CM | POA: Diagnosis not present

## 2017-01-17 DIAGNOSIS — E875 Hyperkalemia: Secondary | ICD-10-CM | POA: Diagnosis not present

## 2017-01-17 DIAGNOSIS — E113299 Type 2 diabetes mellitus with mild nonproliferative diabetic retinopathy without macular edema, unspecified eye: Secondary | ICD-10-CM | POA: Diagnosis not present

## 2017-01-17 DIAGNOSIS — N289 Disorder of kidney and ureter, unspecified: Secondary | ICD-10-CM | POA: Diagnosis not present

## 2017-01-17 DIAGNOSIS — M545 Low back pain: Secondary | ICD-10-CM | POA: Diagnosis not present

## 2017-01-17 DIAGNOSIS — E785 Hyperlipidemia, unspecified: Secondary | ICD-10-CM | POA: Diagnosis not present

## 2017-02-01 DIAGNOSIS — E785 Hyperlipidemia, unspecified: Secondary | ICD-10-CM | POA: Diagnosis not present

## 2017-02-01 DIAGNOSIS — E875 Hyperkalemia: Secondary | ICD-10-CM | POA: Diagnosis not present

## 2017-02-01 DIAGNOSIS — E113299 Type 2 diabetes mellitus with mild nonproliferative diabetic retinopathy without macular edema, unspecified eye: Secondary | ICD-10-CM | POA: Diagnosis not present

## 2017-02-01 DIAGNOSIS — Z23 Encounter for immunization: Secondary | ICD-10-CM | POA: Diagnosis not present

## 2017-02-01 DIAGNOSIS — I10 Essential (primary) hypertension: Secondary | ICD-10-CM | POA: Diagnosis not present

## 2017-02-01 DIAGNOSIS — K219 Gastro-esophageal reflux disease without esophagitis: Secondary | ICD-10-CM | POA: Diagnosis not present

## 2017-02-01 DIAGNOSIS — E1140 Type 2 diabetes mellitus with diabetic neuropathy, unspecified: Secondary | ICD-10-CM | POA: Diagnosis not present

## 2017-02-01 DIAGNOSIS — N289 Disorder of kidney and ureter, unspecified: Secondary | ICD-10-CM | POA: Diagnosis not present

## 2017-02-01 DIAGNOSIS — E039 Hypothyroidism, unspecified: Secondary | ICD-10-CM | POA: Diagnosis not present

## 2017-02-01 DIAGNOSIS — G4700 Insomnia, unspecified: Secondary | ICD-10-CM | POA: Diagnosis not present

## 2017-02-01 DIAGNOSIS — I2510 Atherosclerotic heart disease of native coronary artery without angina pectoris: Secondary | ICD-10-CM | POA: Diagnosis not present

## 2017-02-01 DIAGNOSIS — R809 Proteinuria, unspecified: Secondary | ICD-10-CM | POA: Diagnosis not present

## 2017-02-06 ENCOUNTER — Telehealth: Payer: Self-pay | Admitting: Sports Medicine

## 2017-02-07 NOTE — Telephone Encounter (Signed)
error 

## 2017-02-08 ENCOUNTER — Ambulatory Visit: Payer: PPO | Admitting: *Deleted

## 2017-02-08 DIAGNOSIS — E1142 Type 2 diabetes mellitus with diabetic polyneuropathy: Secondary | ICD-10-CM

## 2017-02-09 ENCOUNTER — Ambulatory Visit: Payer: PPO | Admitting: Sports Medicine

## 2017-02-12 NOTE — Progress Notes (Signed)
Patient ID: James Chan, male   DOB: 17-Mar-1951, 66 y.o.   MRN: [id number]  Patient presents for diabetic shoe pick up.  Patient states that he requested Velcro not lace up.  Shoes are reordered with Velcro closure.  We will call patient when shoes arrive.

## 2017-02-13 ENCOUNTER — Other Ambulatory Visit: Payer: Self-pay | Admitting: *Deleted

## 2017-02-16 DIAGNOSIS — R809 Proteinuria, unspecified: Secondary | ICD-10-CM | POA: Diagnosis not present

## 2017-02-16 DIAGNOSIS — M545 Low back pain: Secondary | ICD-10-CM | POA: Diagnosis not present

## 2017-02-16 DIAGNOSIS — I10 Essential (primary) hypertension: Secondary | ICD-10-CM | POA: Diagnosis not present

## 2017-02-16 DIAGNOSIS — E039 Hypothyroidism, unspecified: Secondary | ICD-10-CM | POA: Diagnosis not present

## 2017-02-16 DIAGNOSIS — K219 Gastro-esophageal reflux disease without esophagitis: Secondary | ICD-10-CM | POA: Diagnosis not present

## 2017-02-16 DIAGNOSIS — E113299 Type 2 diabetes mellitus with mild nonproliferative diabetic retinopathy without macular edema, unspecified eye: Secondary | ICD-10-CM | POA: Diagnosis not present

## 2017-02-16 DIAGNOSIS — E1140 Type 2 diabetes mellitus with diabetic neuropathy, unspecified: Secondary | ICD-10-CM | POA: Diagnosis not present

## 2017-02-16 DIAGNOSIS — E785 Hyperlipidemia, unspecified: Secondary | ICD-10-CM | POA: Diagnosis not present

## 2017-02-16 DIAGNOSIS — N289 Disorder of kidney and ureter, unspecified: Secondary | ICD-10-CM | POA: Diagnosis not present

## 2017-02-16 DIAGNOSIS — E875 Hyperkalemia: Secondary | ICD-10-CM | POA: Diagnosis not present

## 2017-02-16 DIAGNOSIS — E119 Type 2 diabetes mellitus without complications: Secondary | ICD-10-CM | POA: Diagnosis not present

## 2017-02-16 DIAGNOSIS — G4700 Insomnia, unspecified: Secondary | ICD-10-CM | POA: Diagnosis not present

## 2017-02-16 DIAGNOSIS — I2510 Atherosclerotic heart disease of native coronary artery without angina pectoris: Secondary | ICD-10-CM | POA: Diagnosis not present

## 2017-02-26 NOTE — Progress Notes (Signed)
Cardiology Office Note:    Date:  02/28/2017   ID:  James Chan, DOB Sep 07, 1950, MRN [id number]  PCP:  Cher Nakai, MD  Cardiologist:  Shirlee More, MD    Referring MD: Cher Nakai, MD    ASSESSMENT:    1. Chronic diastolic heart failure ([place])   2. Hypertensive heart failure ([place])   3. Coronary artery disease involving native coronary artery of native heart without angina pectoris   4. On amiodarone therapy   5. Chronic anticoagulation   6. PAF (paroxysmal atrial fibrillation) ([facility])    PLAN:    In order of problems listed above:  1. Decompensated with decrease in his loop diuretic weight is up 3 pounds return to his previous dose of torsemide although I suspect most of the edema is due to calcium channel blocker.  Ideally I like to see him on ACE and arm but it cannot be done because of his deterioration and I will place him on alternative antihypertensive good and heart failure with hydralazine.  Because of use of Viagra I will not place him on oral nitrates. 2. Blood pressure poorly controlled likely due to salt overload increase his loop diuretic stop calcium channel blocker and add hydralazine.  I will increase dose if systolic remains greater than 150 at 2 weeks 3. Stable continue current medical therapy with clopidogrel beta-blocker and high intensity statin 4. Stable continue low-dose amiodarone recent labs requested from his PCP to reevaluate liver and thyroid function stable continue his anticoagulant with paroxysmal atrial fibrillation 5. Stable continue low-dose amiodarone   Next appointment: 6 weeks   Medication Adjustments/Labs and Tests Ordered: Current medicines are reviewed at length with the patient today.  Concerns regarding medicines are outlined above.  No orders of the defined types were placed in this encounter.  Meds ordered this encounter  Medications  . torsemide (DEMADEX) 20 MG tablet    Sig: Take 2 tablets (40 mg total) 2 (two) times daily by  mouth.    Dispense:  120 tablet    Refill:  2  . hydrALAZINE (APRESOLINE) 25 MG tablet    Sig: Take 1 tablet (25 mg total) 3 (three) times daily by mouth.    Dispense:  90 tablet    Refill:  3  . sildenafil (VIAGRA) 25 MG tablet    Sig: Take 1 tablet (25 mg total) daily as needed by mouth for erectile dysfunction.    Dispense:  10 tablet    Refill:  1    Chief Complaint  Patient presents with  . Follow-up  . Fatigue    "legs are giving out"  . Edema    feet up to calves, more in the left than the right    History of Present Illness:    James Chan is a 66 y.o. male with a hx of NSTEMI 04/19/16 with PCI, CAD EF 45% with multiple PCI of RCA for restenosis, brief Paroxysmal Atrial Fibrillation on amiodarone, diastolic CHF with his recent MI, Dyslipidemia, HTN, stage 3 CKD, Peripheral Vascular Disease, CKD and anemia last seen 3 months ago.recently his loop diuretic was decreased, started on HCTZ and now increased edema and weakness. Compliance with diet, lifestyle and medications: yes He has increased edema and elevated BP 629-476 systolic for the last month. Hehas had no angina . He requests viagra due to cost of cialis. Past Medical History:  Diagnosis Date  . Anemia   . Anxiety state, unspecified 09/15/2008   Qualifier: Diagnosis of  By: Bullins  CMA, Ami    . Arterial insufficiency of lower extremity () 05/10/2016  . Arthritis   . BACK PAIN, LUMBAR, CHRONIC 09/15/2008   Qualifier: Diagnosis of  By: Bullins CMA, Ami    . CHF (congestive heart failure) ([place])   . Chronic kidney disease   . Claudication ([place]) 10/30/2014  . Comprehensive diabetic foot examination, type 2 DM, encounter for [facility]) 09/15/2008   Qualifier: Diagnosis of  By: Bullins CMA, Ami    . DEPRESSIVE DISORDER 09/15/2008   Qualifier: Diagnosis of  By: Bullins CMA, Ami    . Diabetes mellitus without complication ([place])   . Diabetic polyneuropathy associated with diabetes mellitus due to underlying condition  ([place]) 06/28/2015  . Dyslipidemia 11/24/2015  . Esophageal reflux 09/15/2008   Qualifier: Diagnosis of  By: Bullins CMA, Ami    . Glaucoma   . Hammer toes of both feet 06/28/2015  . History of carotid artery stenosis 10/30/2014  . 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 Hyperlipidemia   . Hypertension   . HYPERTRIGLYCERIDEMIA 09/15/2008   Qualifier: Diagnosis of  By: Bullins CMA, Ami    . Myocardial infarction ([place])   . NSTEMI (non-ST elevated myocardial infarction) ([place]) 09/15/2008   Qualifier: Diagnosis of  By: Bullins CMA, Ami    . Onychomycosis due to dermatophyte 06/28/2015  . PAD (peripheral artery disease) ([place]) 10/30/2014  . Stroke [facility])     Past Surgical History:  Procedure Laterality Date  . angioplasty of iliofemoral and iliac artery with stent placement      Current Medications: Current Meds  Medication Sig  . amiodarone (PACERONE) 400 MG tablet Take 200 mg by mouth daily.   Marland Kitchen amLODipine (NORVASC) 10 MG tablet Take 10 mg by mouth daily.  Marland Kitchen apixaban (ELIQUIS) 5 MG TABS tablet Take 5 mg by mouth 2 (two) times daily.  Marland Kitchen atorvastatin (LIPITOR) 40 MG tablet Take 40 mg by mouth daily.  . clopidogrel (PLAVIX) 75 MG tablet Take 75 mg by mouth daily.  Marland Kitchen gabapentin (NEURONTIN) 600 MG tablet Take 300 mg by mouth 2 (two) times daily.   . insulin detemir (LEVEMIR) 100 UNIT/ML injection Inject 50 Units into the skin 2 (two) times daily.   Marland Kitchen latanoprost (XALATAN) 0.005 % ophthalmic solution Place 1 drop into both eyes at bedtime.  . metFORMIN (GLUCOPHAGE) 850 MG tablet Take 850 mg by mouth 3 (three) times daily.  . metoprolol (LOPRESSOR) 100 MG tablet Take 100 mg by mouth 2 (two) times daily.  Marland Kitchen oxyCODONE-acetaminophen (PERCOCET/ROXICET) 5-325 MG tablet Take 1 tablet by mouth every 6 (six) hours as needed for severe pain.  Marland Kitchen research study medication Take 1 each by mouth daily.  Marland Kitchen torsemide (DEMADEX) 20 MG tablet Take 2 tablets (40 mg total) 2 (two) times daily by mouth.    . [DISCONTINUED] hydrochlorothiazide (HYDRODIURIL) 25 MG tablet Take 25 mg daily by mouth.  . [DISCONTINUED] tadalafil (CIALIS) 10 MG tablet Take 1 tablet (10 mg total) by mouth daily as needed for erectile dysfunction.  . [DISCONTINUED] torsemide (DEMADEX) 20 MG tablet Take 2 tablets (40 mg total) by mouth 2 (two) times daily. (Patient taking differently: Take 40 mg 2 (two) times daily by mouth. Take 1 in am and 2 in pm)     Allergies:   Patient has no known allergies.   Social History   Socioeconomic History  . Marital status: Married    Spouse name: None  . Number of children: None  . Years of education: None  .  Highest education level: None  Social Needs  . Financial resource strain: None  . Food insecurity - worry: None  . Food insecurity - inability: None  . Transportation needs - medical: None  . Transportation needs - non-medical: None  Occupational History  . None  Tobacco Use  . Smoking status: Former Smoker    Last attempt to quit: 05/09/1994    Years since quitting: 22.8  . Smokeless tobacco: Never Used  Substance and Sexual Activity  . Alcohol use: No  . Drug use: No  . Sexual activity: None  Other Topics Concern  . None  Social History Narrative  . None     Family History: The patient's family history includes Cancer in his father and mother; Heart disease in his father and mother. ROS:   Please see the history of present illness.    All other systems reviewed and are negative.  EKGs/Labs/Other Studies Reviewed:    The following studies were reviewed today  Recent Labs: No results found for requested labs within last 8760 hours.  Recent Lipid Panel No results found for: CHOL, TRIG, HDL, CHOLHDL, VLDL, LDLCALC, LDLDIRECT  Physical Exam:    VS:  BP (!) 168/68 (BP Location: Left Arm, Patient Position: Sitting)   Pulse (!) 54   Ht 6' (1.829 m)   Wt 211 lb (95.7 kg)   SpO2 96%   BMI 28.62 kg/m     Wt Readings from Last 3 Encounters:  02/28/17  211 lb (95.7 kg)  11/28/16 213 lb 1.3 oz (96.7 kg)  11/10/16 200 lb (90.7 kg)     GEN:  Well nourished, well developed in no acute distress HEENT: Normal NECK: No JVD; No carotid bruits LYMPHATICS: No lymphadenopathy CARDIAC: RRR, no murmurs, rubs, gallops RESPIRATORY:  Clear to auscultation without rales, wheezing or rhonchi  ABDOMEN: Soft, non-tender, non-distended MUSCULOSKELETAL:  Doughy 4+ edema to to the knees bilaterally No deformity  SKIN: Warm and dry NEUROLOGIC:  Alert and oriented x 3 PSYCHIATRIC:  Normal affect    Signed, Shirlee More, MD  02/28/2017 1:59 PM    [place]

## 2017-02-27 DIAGNOSIS — I480 Paroxysmal atrial fibrillation: Secondary | ICD-10-CM | POA: Insufficient documentation

## 2017-02-27 HISTORY — DX: Paroxysmal atrial fibrillation: I48.0

## 2017-02-28 ENCOUNTER — Encounter: Payer: Self-pay | Admitting: Cardiology

## 2017-02-28 ENCOUNTER — Ambulatory Visit: Payer: PPO | Admitting: Cardiology

## 2017-02-28 VITALS — BP 168/68 | HR 54 | Ht 72.0 in | Wt 211.0 lb

## 2017-02-28 DIAGNOSIS — I2510 Atherosclerotic heart disease of native coronary artery without angina pectoris: Secondary | ICD-10-CM

## 2017-02-28 DIAGNOSIS — I480 Paroxysmal atrial fibrillation: Secondary | ICD-10-CM

## 2017-02-28 DIAGNOSIS — Z7901 Long term (current) use of anticoagulants: Secondary | ICD-10-CM | POA: Diagnosis not present

## 2017-02-28 DIAGNOSIS — I110 Hypertensive heart disease with heart failure: Secondary | ICD-10-CM

## 2017-02-28 DIAGNOSIS — I5032 Chronic diastolic (congestive) heart failure: Secondary | ICD-10-CM

## 2017-02-28 DIAGNOSIS — Z79899 Other long term (current) drug therapy: Secondary | ICD-10-CM

## 2017-02-28 MED ORDER — SILDENAFIL CITRATE 25 MG PO TABS: 25.0000 mg | ORAL_TABLET | Freq: Every day | ORAL | 1 refills | Status: DC | PRN

## 2017-02-28 MED ORDER — TORSEMIDE 20 MG PO TABS: 40.0000 mg | ORAL_TABLET | Freq: Two times a day (BID) | ORAL | 2 refills | Status: DC

## 2017-02-28 MED ORDER — HYDRALAZINE HCL 25 MG PO TABS: 25.0000 mg | ORAL_TABLET | Freq: Three times a day (TID) | ORAL | 3 refills | Status: DC

## 2017-02-28 NOTE — Patient Instructions (Addendum)
Medication Instructions:  Your physician has recommended you make the following change in your medication:  STOP amlodipine START hydralazine 25 mg three times daily STOP cialis START sildenafil (Viagra) 25 mg as needed STOP hydrochlorothiazide INCREASE toresmide to 2 tablets twice daily  Labwork: None  Testing/Procedures: None  Follow-Up: Your physician recommends that you schedule a follow-up appointment in: 6 weeks.  Any Other Special Instructions Will Be Listed Below (If Applicable).     If you need a refill on your cardiac medications before your next appointment, please call your pharmacy.   Call if edema persists after 2 weeks or BP remains > 509 systolic

## 2017-03-14 ENCOUNTER — Encounter: Payer: Self-pay | Admitting: Sports Medicine

## 2017-03-14 ENCOUNTER — Ambulatory Visit (INDEPENDENT_AMBULATORY_CARE_PROVIDER_SITE_OTHER): Payer: PPO | Admitting: Sports Medicine

## 2017-03-14 DIAGNOSIS — M2041 Other hammer toe(s) (acquired), right foot: Secondary | ICD-10-CM | POA: Diagnosis not present

## 2017-03-14 DIAGNOSIS — E1142 Type 2 diabetes mellitus with diabetic polyneuropathy: Secondary | ICD-10-CM

## 2017-03-14 DIAGNOSIS — M2042 Other hammer toe(s) (acquired), left foot: Secondary | ICD-10-CM | POA: Diagnosis not present

## 2017-03-14 NOTE — Progress Notes (Signed)
Patient ID: James Chan, male   DOB: [birth year], 66 y.o.   MRN: [id number]   Patient presents for diabetic shoe pick up, shoes are tried on for good fit.  Patient received 1 pair [facility] walker v-strap black in men's size 12.5 wide and 3 pairs custom molded diabetic inserts.  Verbal and written break in and wear instructions given.

## 2017-03-14 NOTE — Progress Notes (Deleted)
Previously noted.

## 2017-03-14 NOTE — Progress Notes (Signed)
Patient discussed with medical assistant. Agree with note from medical assistant. Patient to follow up as scheduled for continued care or sooner if problems or issues arise. -Dr. Cannon Kettle

## 2017-03-14 NOTE — Patient Instructions (Signed)

## 2017-03-23 DIAGNOSIS — M545 Low back pain: Secondary | ICD-10-CM | POA: Diagnosis not present

## 2017-03-23 DIAGNOSIS — N289 Disorder of kidney and ureter, unspecified: Secondary | ICD-10-CM | POA: Diagnosis not present

## 2017-03-23 DIAGNOSIS — E875 Hyperkalemia: Secondary | ICD-10-CM | POA: Diagnosis not present

## 2017-03-23 DIAGNOSIS — R609 Edema, unspecified: Secondary | ICD-10-CM | POA: Diagnosis not present

## 2017-03-23 DIAGNOSIS — G4700 Insomnia, unspecified: Secondary | ICD-10-CM | POA: Diagnosis not present

## 2017-03-23 DIAGNOSIS — K219 Gastro-esophageal reflux disease without esophagitis: Secondary | ICD-10-CM | POA: Diagnosis not present

## 2017-03-23 DIAGNOSIS — E039 Hypothyroidism, unspecified: Secondary | ICD-10-CM | POA: Diagnosis not present

## 2017-03-23 DIAGNOSIS — E113299 Type 2 diabetes mellitus with mild nonproliferative diabetic retinopathy without macular edema, unspecified eye: Secondary | ICD-10-CM | POA: Diagnosis not present

## 2017-03-23 DIAGNOSIS — I10 Essential (primary) hypertension: Secondary | ICD-10-CM | POA: Diagnosis not present

## 2017-03-23 DIAGNOSIS — R809 Proteinuria, unspecified: Secondary | ICD-10-CM | POA: Diagnosis not present

## 2017-03-23 DIAGNOSIS — E1140 Type 2 diabetes mellitus with diabetic neuropathy, unspecified: Secondary | ICD-10-CM | POA: Diagnosis not present

## 2017-03-23 DIAGNOSIS — I2510 Atherosclerotic heart disease of native coronary artery without angina pectoris: Secondary | ICD-10-CM | POA: Diagnosis not present

## 2017-03-23 DIAGNOSIS — E785 Hyperlipidemia, unspecified: Secondary | ICD-10-CM | POA: Diagnosis not present

## 2017-03-26 ENCOUNTER — Other Ambulatory Visit: Payer: Self-pay | Admitting: Cardiology

## 2017-03-26 ENCOUNTER — Telehealth: Payer: Self-pay | Admitting: Cardiology

## 2017-03-26 DIAGNOSIS — I38 Endocarditis, valve unspecified: Secondary | ICD-10-CM

## 2017-03-26 DIAGNOSIS — I5042 Chronic combined systolic (congestive) and diastolic (congestive) heart failure: Principal | ICD-10-CM

## 2017-03-26 MED ORDER — TORSEMIDE 20 MG PO TABS: 20.0000 mg | ORAL_TABLET | Freq: Two times a day (BID) | ORAL | 2 refills | Status: DC

## 2017-03-26 NOTE — Telephone Encounter (Signed)
Dr. Bettina Gavia aware, waiting for lab results.

## 2017-03-26 NOTE — Telephone Encounter (Signed)
Patient states that his PCP called him after having some labwork and told him to call his cardiologist to re-evlauate his fluid pill due to his kidney function was getting worse.. I have called for lab results on patient from Dr Truman Hayward.James Chan

## 2017-03-26 NOTE — Telephone Encounter (Signed)
Lab work reviewed by Dr. Bettina Gavia. Patient advised to decrease torsemide to one tablet in the morning and one tablet in the afternoon. Patient verbalized understanding. No further questions.

## 2017-03-27 DIAGNOSIS — E1140 Type 2 diabetes mellitus with diabetic neuropathy, unspecified: Secondary | ICD-10-CM | POA: Diagnosis not present

## 2017-03-27 DIAGNOSIS — D649 Anemia, unspecified: Secondary | ICD-10-CM | POA: Diagnosis not present

## 2017-03-27 DIAGNOSIS — D509 Iron deficiency anemia, unspecified: Secondary | ICD-10-CM | POA: Diagnosis not present

## 2017-03-27 DIAGNOSIS — K219 Gastro-esophageal reflux disease without esophagitis: Secondary | ICD-10-CM | POA: Diagnosis not present

## 2017-03-27 DIAGNOSIS — E113299 Type 2 diabetes mellitus with mild nonproliferative diabetic retinopathy without macular edema, unspecified eye: Secondary | ICD-10-CM | POA: Diagnosis not present

## 2017-03-27 DIAGNOSIS — N289 Disorder of kidney and ureter, unspecified: Secondary | ICD-10-CM | POA: Diagnosis not present

## 2017-03-27 DIAGNOSIS — I2510 Atherosclerotic heart disease of native coronary artery without angina pectoris: Secondary | ICD-10-CM | POA: Diagnosis not present

## 2017-03-27 DIAGNOSIS — G4700 Insomnia, unspecified: Secondary | ICD-10-CM | POA: Diagnosis not present

## 2017-03-27 DIAGNOSIS — R809 Proteinuria, unspecified: Secondary | ICD-10-CM | POA: Diagnosis not present

## 2017-03-27 DIAGNOSIS — M545 Low back pain: Secondary | ICD-10-CM | POA: Diagnosis not present

## 2017-03-27 DIAGNOSIS — E875 Hyperkalemia: Secondary | ICD-10-CM | POA: Diagnosis not present

## 2017-03-27 DIAGNOSIS — E785 Hyperlipidemia, unspecified: Secondary | ICD-10-CM | POA: Diagnosis not present

## 2017-03-27 DIAGNOSIS — R609 Edema, unspecified: Secondary | ICD-10-CM | POA: Diagnosis not present

## 2017-03-29 DIAGNOSIS — E1122 Type 2 diabetes mellitus with diabetic chronic kidney disease: Secondary | ICD-10-CM | POA: Diagnosis not present

## 2017-03-29 DIAGNOSIS — R739 Hyperglycemia, unspecified: Secondary | ICD-10-CM | POA: Diagnosis not present

## 2017-03-29 DIAGNOSIS — Z8673 Personal history of transient ischemic attack (TIA), and cerebral infarction without residual deficits: Secondary | ICD-10-CM | POA: Diagnosis not present

## 2017-03-29 DIAGNOSIS — I129 Hypertensive chronic kidney disease with stage 1 through stage 4 chronic kidney disease, or unspecified chronic kidney disease: Secondary | ICD-10-CM | POA: Diagnosis not present

## 2017-03-29 DIAGNOSIS — D649 Anemia, unspecified: Secondary | ICD-10-CM | POA: Diagnosis not present

## 2017-03-29 DIAGNOSIS — R079 Chest pain, unspecified: Secondary | ICD-10-CM | POA: Diagnosis not present

## 2017-03-29 DIAGNOSIS — E785 Hyperlipidemia, unspecified: Secondary | ICD-10-CM | POA: Diagnosis not present

## 2017-03-29 DIAGNOSIS — N179 Acute kidney failure, unspecified: Secondary | ICD-10-CM | POA: Diagnosis not present

## 2017-03-29 DIAGNOSIS — Z7984 Long term (current) use of oral hypoglycemic drugs: Secondary | ICD-10-CM | POA: Diagnosis not present

## 2017-03-29 DIAGNOSIS — I714 Abdominal aortic aneurysm, without rupture: Secondary | ICD-10-CM | POA: Diagnosis not present

## 2017-03-29 DIAGNOSIS — Z79899 Other long term (current) drug therapy: Secondary | ICD-10-CM | POA: Diagnosis not present

## 2017-03-29 DIAGNOSIS — Z79891 Long term (current) use of opiate analgesic: Secondary | ICD-10-CM | POA: Diagnosis not present

## 2017-03-29 DIAGNOSIS — R0602 Shortness of breath: Secondary | ICD-10-CM | POA: Diagnosis not present

## 2017-03-29 DIAGNOSIS — Z7901 Long term (current) use of anticoagulants: Secondary | ICD-10-CM | POA: Diagnosis not present

## 2017-03-29 DIAGNOSIS — I10 Essential (primary) hypertension: Secondary | ICD-10-CM | POA: Diagnosis not present

## 2017-03-29 DIAGNOSIS — M1990 Unspecified osteoarthritis, unspecified site: Secondary | ICD-10-CM | POA: Diagnosis not present

## 2017-03-29 DIAGNOSIS — I509 Heart failure, unspecified: Secondary | ICD-10-CM | POA: Diagnosis not present

## 2017-03-29 DIAGNOSIS — Z794 Long term (current) use of insulin: Secondary | ICD-10-CM | POA: Diagnosis not present

## 2017-03-29 DIAGNOSIS — E8771 Transfusion associated circulatory overload: Secondary | ICD-10-CM | POA: Diagnosis not present

## 2017-03-29 DIAGNOSIS — E119 Type 2 diabetes mellitus without complications: Secondary | ICD-10-CM

## 2017-03-29 DIAGNOSIS — I130 Hypertensive heart and chronic kidney disease with heart failure and stage 1 through stage 4 chronic kidney disease, or unspecified chronic kidney disease: Secondary | ICD-10-CM | POA: Diagnosis not present

## 2017-03-29 DIAGNOSIS — D638 Anemia in other chronic diseases classified elsewhere: Secondary | ICD-10-CM | POA: Diagnosis not present

## 2017-03-29 DIAGNOSIS — R109 Unspecified abdominal pain: Secondary | ICD-10-CM | POA: Diagnosis not present

## 2017-03-29 DIAGNOSIS — J90 Pleural effusion, not elsewhere classified: Secondary | ICD-10-CM | POA: Diagnosis not present

## 2017-03-29 DIAGNOSIS — G4733 Obstructive sleep apnea (adult) (pediatric): Secondary | ICD-10-CM | POA: Diagnosis not present

## 2017-03-29 DIAGNOSIS — D72829 Elevated white blood cell count, unspecified: Secondary | ICD-10-CM | POA: Diagnosis not present

## 2017-03-29 DIAGNOSIS — N184 Chronic kidney disease, stage 4 (severe): Secondary | ICD-10-CM | POA: Diagnosis not present

## 2017-03-29 DIAGNOSIS — Z955 Presence of coronary angioplasty implant and graft: Secondary | ICD-10-CM | POA: Diagnosis not present

## 2017-03-29 DIAGNOSIS — N189 Chronic kidney disease, unspecified: Secondary | ICD-10-CM | POA: Diagnosis not present

## 2017-03-29 DIAGNOSIS — I252 Old myocardial infarction: Secondary | ICD-10-CM | POA: Diagnosis not present

## 2017-03-29 DIAGNOSIS — I2510 Atherosclerotic heart disease of native coronary artery without angina pectoris: Secondary | ICD-10-CM | POA: Diagnosis not present

## 2017-03-29 DIAGNOSIS — I739 Peripheral vascular disease, unspecified: Secondary | ICD-10-CM | POA: Diagnosis not present

## 2017-03-29 DIAGNOSIS — I161 Hypertensive emergency: Secondary | ICD-10-CM | POA: Diagnosis not present

## 2017-03-29 DIAGNOSIS — I700 Atherosclerosis of aorta: Secondary | ICD-10-CM | POA: Diagnosis not present

## 2017-03-30 DIAGNOSIS — D649 Anemia, unspecified: Secondary | ICD-10-CM | POA: Diagnosis not present

## 2017-03-30 DIAGNOSIS — G4733 Obstructive sleep apnea (adult) (pediatric): Secondary | ICD-10-CM | POA: Diagnosis not present

## 2017-03-30 DIAGNOSIS — I10 Essential (primary) hypertension: Secondary | ICD-10-CM | POA: Diagnosis not present

## 2017-03-30 DIAGNOSIS — R739 Hyperglycemia, unspecified: Secondary | ICD-10-CM | POA: Diagnosis not present

## 2017-03-30 DIAGNOSIS — E119 Type 2 diabetes mellitus without complications: Secondary | ICD-10-CM | POA: Diagnosis not present

## 2017-03-30 DIAGNOSIS — N179 Acute kidney failure, unspecified: Secondary | ICD-10-CM | POA: Diagnosis not present

## 2017-03-30 DIAGNOSIS — I739 Peripheral vascular disease, unspecified: Secondary | ICD-10-CM | POA: Diagnosis not present

## 2017-03-30 DIAGNOSIS — E8771 Transfusion associated circulatory overload: Secondary | ICD-10-CM | POA: Diagnosis not present

## 2017-03-30 DIAGNOSIS — E785 Hyperlipidemia, unspecified: Secondary | ICD-10-CM | POA: Diagnosis not present

## 2017-04-04 ENCOUNTER — Other Ambulatory Visit: Payer: Self-pay

## 2017-04-04 NOTE — Patient Outreach (Signed)
[facility]) Care Management  04/04/2017  James Chan December 03, 1950 [id number]     Transition of Care Referral  Referral Date: 04/04/17 Referral Source: [facility] Discharge Report Date of Admission: unknown Diagnosis: unknown Date of Discharge: unknown Facility: [place]: [facility]   Referral received. No outreach warranted at this time. TOC will be completed by primary care provider office who will refer to [facility] care mgmt if needed.   Plan: RN CM will notify [facility] administrative assistant of case status.   Enzo Montgomery, RN,BSN,CCM [place] Management Telephonic Care Management Coordinator Direct Phone: [phone] Toll Free: [phone] Fax: [phone]

## 2017-04-05 DIAGNOSIS — N289 Disorder of kidney and ureter, unspecified: Secondary | ICD-10-CM | POA: Diagnosis not present

## 2017-04-05 DIAGNOSIS — E875 Hyperkalemia: Secondary | ICD-10-CM | POA: Diagnosis not present

## 2017-04-05 DIAGNOSIS — R809 Proteinuria, unspecified: Secondary | ICD-10-CM | POA: Diagnosis not present

## 2017-04-05 DIAGNOSIS — K219 Gastro-esophageal reflux disease without esophagitis: Secondary | ICD-10-CM | POA: Diagnosis not present

## 2017-04-05 DIAGNOSIS — D649 Anemia, unspecified: Secondary | ICD-10-CM | POA: Diagnosis not present

## 2017-04-05 DIAGNOSIS — I509 Heart failure, unspecified: Secondary | ICD-10-CM | POA: Diagnosis not present

## 2017-04-05 DIAGNOSIS — M545 Low back pain: Secondary | ICD-10-CM | POA: Diagnosis not present

## 2017-04-05 DIAGNOSIS — I2510 Atherosclerotic heart disease of native coronary artery without angina pectoris: Secondary | ICD-10-CM | POA: Diagnosis not present

## 2017-04-05 DIAGNOSIS — E113299 Type 2 diabetes mellitus with mild nonproliferative diabetic retinopathy without macular edema, unspecified eye: Secondary | ICD-10-CM | POA: Diagnosis not present

## 2017-04-05 DIAGNOSIS — G4700 Insomnia, unspecified: Secondary | ICD-10-CM | POA: Diagnosis not present

## 2017-04-05 DIAGNOSIS — E785 Hyperlipidemia, unspecified: Secondary | ICD-10-CM | POA: Diagnosis not present

## 2017-04-05 DIAGNOSIS — E1140 Type 2 diabetes mellitus with diabetic neuropathy, unspecified: Secondary | ICD-10-CM | POA: Diagnosis not present

## 2017-04-05 DIAGNOSIS — I10 Essential (primary) hypertension: Secondary | ICD-10-CM | POA: Diagnosis not present

## 2017-04-10 NOTE — Progress Notes (Signed)
Cardiology Office Note:    Date:  04/11/2017   ID:  James Chan, DOB 1950/08/22, MRN [id number]  PCP:  Cher Nakai, MD  Cardiologist:  Shirlee More, MD    Referring MD: Cher Nakai, MD    ASSESSMENT:    1. Chronic diastolic heart failure ([place])   2. Chronic kidney disease, unspecified CKD stage   3. PAF (paroxysmal atrial fibrillation) ([place])   4. On amiodarone therapy   5. Chronic anticoagulation   6. Coronary artery disease involving native coronary artery of native heart without angina pectoris    PLAN:    In order of problems listed above:  1. His course has been complicated by severe anemia and acute heart failure in the setting of transfusion.  I think 1 of the key factors here is to mitigate his anemia and referral to hematology as appropriate.  He remains fluid overloaded will increase his diuretic and used weight-based diuretic dosage.  Hypertension is not well controlled and I added minoxidil.  He will be seen back in the office in 4 weeks to reassess. 2. Stable managed by PCP 3. Stable in sinus rhythm continue amiodarone and anticoagulant 4. Stable continue amiodarone no evidence of toxicity he has had a recent fall labs as well as chest x-ray at [facility] 5. Stable continue his anticoagulant 6. Stable continue current medical treatment including clopidogrel beta-blocker and high intensity statin.   Next appointment: 4 weeks   Medication Adjustments/Labs and Tests Ordered: Current medicines are reviewed at length with the patient today.  Concerns regarding medicines are outlined above.  No orders of the defined types were placed in this encounter.  No orders of the defined types were placed in this encounter.   Chief Complaint  Patient presents with  . Follow-up  . Edema    Especially in Lt ankle    History of Present Illness:    James Chan is a 66 y.o. male with a hx of NSTEMI 04/19/16 with PCI, CAD EF 45% with multiple PCI of RCA for  restenosis, brief Paroxysmal Atrial Fibrillation on amiodarone, diastolic CHF with his recent MI, Dyslipidemia, HTN, stage 3 CKD, Peripheral Vascular Disease, CKD and anemia  last seen 6 weeks ago with decompensated heart failure I was unaware until he arrived that he was recently at [facility] admitted 12 6 discharged 12 7.  He was severely anemic as an outpatient received 2 units of blood and developed decompensated heart failure.  In the emergency room there is abdominal pain noncontrast CT showed stable infrarenal abdominal aortic aneurysm without evidence of enlargement or dissection.  He was diuresed and discharged from the hospital.  Laboratory studies included a creatinine 2.5 hemoglobin 11.1 posttransfusion discharge hemoglobin 10.4 troponin was undetectable BNP level elevated at 6340 EKG showed sinus rhythm.  He also had severe hypertension when he was in the hospital. Compliance with diet, lifestyle and medications: Yes Since hospital discharge his weight is down 3 pounds but still has marked peripheral edema feels weak but no shortness of breath orthopnea chest pain palpitation or syncope.  He is awaiting appointment with hematology to consider treatment for anemia associated with chronic kidney disease.  Home blood pressure is elevated running in the [id number] systolic on current treatment. Past Medical History:  Diagnosis Date  . Anemia   . Anxiety state, unspecified 09/15/2008   Qualifier: Diagnosis of  By: Bullins CMA, Ami    . Arterial insufficiency of lower extremity ([place]) 05/10/2016  . Arthritis   .  BACK PAIN, LUMBAR, CHRONIC 09/15/2008   Qualifier: Diagnosis of  By: Bullins CMA, Ami    . CHF (congestive heart failure) ([place])   . Chronic kidney disease   . Claudication ([place]) 10/30/2014  . Comprehensive diabetic foot examination, type 2 DM, encounter for [facility]) 09/15/2008   Qualifier: Diagnosis of  By: Bullins CMA, Ami    . DEPRESSIVE DISORDER 09/15/2008   Qualifier: Diagnosis of   By: Bullins CMA, Ami    . Diabetes mellitus without complication ([place])   . Diabetic polyneuropathy associated with diabetes mellitus due to underlying condition ([place]) 06/28/2015  . Dyslipidemia 11/24/2015  . Esophageal reflux 09/15/2008   Qualifier: Diagnosis of  By: Bullins CMA, Ami    . Glaucoma   . Hammer toes of both feet 06/28/2015  . History of carotid artery stenosis 10/30/2014  . 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 Hyperlipidemia   . Hypertension   . HYPERTRIGLYCERIDEMIA 09/15/2008   Qualifier: Diagnosis of  By: Bullins CMA, Ami    . Myocardial infarction ([place])   . NSTEMI (non-ST elevated myocardial infarction) ([place]) 09/15/2008   Qualifier: Diagnosis of  By: Bullins CMA, Ami    . Onychomycosis due to dermatophyte 06/28/2015  . PAD (peripheral artery disease) ([place]) 10/30/2014  . Stroke [facility])     Past Surgical History:  Procedure Laterality Date  . angioplasty of iliofemoral and iliac artery with stent placement      Current Medications: Current Meds  Medication Sig  . amiodarone (PACERONE) 400 MG tablet Take 200 mg by mouth daily.   Marland Kitchen apixaban (ELIQUIS) 5 MG TABS tablet Take 5 mg by mouth 2 (two) times daily.  Marland Kitchen atorvastatin (LIPITOR) 40 MG tablet Take 40 mg by mouth daily.  . clopidogrel (PLAVIX) 75 MG tablet Take 75 mg by mouth daily.  Marland Kitchen gabapentin (NEURONTIN) 600 MG tablet Take 300 mg by mouth 2 (two) times daily.   . hydrALAZINE (APRESOLINE) 50 MG tablet Take 50 mg by mouth 3 (three) times daily.  . insulin detemir (LEVEMIR) 100 UNIT/ML injection Inject 50 Units into the skin 2 (two) times daily.   Marland Kitchen latanoprost (XALATAN) 0.005 % ophthalmic solution Place 1 drop into both eyes at bedtime.  . metFORMIN (GLUCOPHAGE) 850 MG tablet Take 850 mg by mouth 2 (two) times daily with a meal.   . metoprolol (LOPRESSOR) 100 MG tablet Take 100 mg by mouth 2 (two) times daily.  . nitroGLYCERIN (NITROSTAT) 0.4 MG SL tablet Place 0.4 mg under the tongue every 5 (five)  minutes as needed for chest pain.  Marland Kitchen oxyCODONE-acetaminophen (PERCOCET/ROXICET) 5-325 MG tablet Take 1 tablet by mouth every 6 (six) hours as needed for severe pain.  Marland Kitchen research study medication Take 1 each by mouth daily.  . sildenafil (VIAGRA) 25 MG tablet Take 1 tablet (25 mg total) daily as needed by mouth for erectile dysfunction.  . tamsulosin (FLOMAX) 0.4 MG CAPS capsule   . torsemide (DEMADEX) 20 MG tablet Take 1 tablet (20 mg total) by mouth 2 (two) times daily. (Patient taking differently: Take 20 mg by mouth 3 (three) times daily. Takes 20 mg in am and 40 mg in afternoon)     Allergies:   Patient has no known allergies.   Social History   Socioeconomic History  . Marital status: Married    Spouse name: None  . Number of children: None  . Years of education: None  . Highest education level: None  Social Needs  . Financial resource strain:  None  . Food insecurity - worry: None  . Food insecurity - inability: None  . Transportation needs - medical: None  . Transportation needs - non-medical: None  Occupational History  . None  Tobacco Use  . Smoking status: Former Smoker    Last attempt to quit: 05/09/1994    Years since quitting: 22.9  . Smokeless tobacco: Never Used  Substance and Sexual Activity  . Alcohol use: No  . Drug use: No  . Sexual activity: None  Other Topics Concern  . None  Social History Narrative  . None     Family History: The patient's family history includes Cancer in his father and mother; Heart disease in his father and mother. ROS:   Please see the history of present illness.    All other systems reviewed and are negative.  EKGs/Labs/Other Studies Reviewed:    The following studies were reviewed today: [facility] ED admission discharge records reviewed prior to seeing the patient  Recent Labs: 02/17/17 Cr 2.49 K 5.1 No results found for requested labs within last 8760 hours.  Recent Lipid Panel No results found for: CHOL,  TRIG, HDL, CHOLHDL, VLDL, LDLCALC, LDLDIRECT  Physical Exam:    VS:  BP (!) 180/70 (BP Location: Right Arm, Patient Position: Sitting, Cuff Size: Normal)   Pulse (!) 51   Ht 6' (1.829 m)   Wt 209 lb (94.8 kg)   SpO2 98%   BMI 28.35 kg/m     Wt Readings from Last 3 Encounters:  04/11/17 209 lb (94.8 kg)  02/28/17 211 lb (95.7 kg)  11/28/16 213 lb 1.3 oz (96.7 kg)     GEN: He looks chronically ill well nourished, well developed in no acute distress HEENT: Normal NECK: No JVD; No carotid bruits LYMPHATICS: No lymphadenopathy CARDIAC: Soft S1 no S3 RRR, no murmurs, rubs, gallops RESPIRATORY:  Clear to auscultation without rales, wheezing or rhonchi  ABDOMEN: Soft, non-tender, non-distended MUSCULOSKELETAL: Bilateral 3-4+ pitting edema to the knee edema; No deformity  SKIN: Warm and dry NEUROLOGIC:  Alert and oriented x 3 PSYCHIATRIC:  Normal affect    Signed, Shirlee More, MD  04/11/2017 10:25 AM    [place]

## 2017-04-11 ENCOUNTER — Ambulatory Visit: Payer: PPO | Admitting: Cardiology

## 2017-04-11 ENCOUNTER — Encounter: Payer: Self-pay | Admitting: Cardiology

## 2017-04-11 VITALS — BP 180/70 | HR 51 | Ht 72.0 in | Wt 209.0 lb

## 2017-04-11 DIAGNOSIS — I38 Endocarditis, valve unspecified: Secondary | ICD-10-CM

## 2017-04-11 DIAGNOSIS — Z7901 Long term (current) use of anticoagulants: Secondary | ICD-10-CM

## 2017-04-11 DIAGNOSIS — Z79899 Other long term (current) drug therapy: Secondary | ICD-10-CM | POA: Diagnosis not present

## 2017-04-11 DIAGNOSIS — I480 Paroxysmal atrial fibrillation: Secondary | ICD-10-CM | POA: Diagnosis not present

## 2017-04-11 DIAGNOSIS — I5042 Chronic combined systolic (congestive) and diastolic (congestive) heart failure: Secondary | ICD-10-CM

## 2017-04-11 DIAGNOSIS — N189 Chronic kidney disease, unspecified: Secondary | ICD-10-CM

## 2017-04-11 DIAGNOSIS — I110 Hypertensive heart disease with heart failure: Secondary | ICD-10-CM

## 2017-04-11 DIAGNOSIS — I5032 Chronic diastolic (congestive) heart failure: Secondary | ICD-10-CM

## 2017-04-11 DIAGNOSIS — I2510 Atherosclerotic heart disease of native coronary artery without angina pectoris: Secondary | ICD-10-CM | POA: Diagnosis not present

## 2017-04-11 HISTORY — DX: Chronic kidney disease, unspecified: N18.9

## 2017-04-11 MED ORDER — MINOXIDIL 2.5 MG PO TABS: 2.5000 mg | ORAL_TABLET | Freq: Two times a day (BID) | ORAL | 3 refills | Status: DC

## 2017-04-11 MED ORDER — MINOXIDIL 2.5 MG PO TABS: 2.5000 mg | ORAL_TABLET | Freq: Every day | ORAL | 3 refills | Status: DC

## 2017-04-11 MED ORDER — TORSEMIDE 20 MG PO TABS: 20.0000 mg | ORAL_TABLET | Freq: Two times a day (BID) | ORAL | 2 refills | Status: DC

## 2017-04-11 MED ORDER — TORSEMIDE 20 MG PO TABS: 40.0000 mg | ORAL_TABLET | Freq: Two times a day (BID) | ORAL | 2 refills | Status: DC

## 2017-04-11 NOTE — Patient Instructions (Signed)
Medication Instructions:  Your physician has recommended you make the following change in your medication:  START minoxidil 2.5 mg twice daily INCREASE torsemide (Demadex) to 40 mg (2 tablets) twice daily. Reduce to 60 mg (2 tablets in the morning and 1 tablet in the afternoon) if you weigh less than 203 pounds.  Labwork: None  Testing/Procedures: None  Follow-Up: Your physician recommends that you schedule a follow-up appointment in: 4 weeks.  Any Other Special Instructions Will Be Listed Below (If Applicable).     If you need a refill on your cardiac medications before your next appointment, please call your pharmacy.

## 2017-04-20 ENCOUNTER — Inpatient Hospital Stay: Admission: AD | Admit: 2017-04-20 | Payer: PPO | Source: Other Acute Inpatient Hospital | Admitting: Internal Medicine

## 2017-04-20 ENCOUNTER — Telehealth: Payer: Self-pay

## 2017-04-20 DIAGNOSIS — R609 Edema, unspecified: Secondary | ICD-10-CM | POA: Diagnosis not present

## 2017-04-20 DIAGNOSIS — Z955 Presence of coronary angioplasty implant and graft: Secondary | ICD-10-CM | POA: Diagnosis not present

## 2017-04-20 DIAGNOSIS — I2510 Atherosclerotic heart disease of native coronary artery without angina pectoris: Secondary | ICD-10-CM | POA: Diagnosis not present

## 2017-04-20 DIAGNOSIS — R188 Other ascites: Secondary | ICD-10-CM | POA: Diagnosis not present

## 2017-04-20 DIAGNOSIS — R109 Unspecified abdominal pain: Secondary | ICD-10-CM | POA: Diagnosis not present

## 2017-04-20 DIAGNOSIS — I130 Hypertensive heart and chronic kidney disease with heart failure and stage 1 through stage 4 chronic kidney disease, or unspecified chronic kidney disease: Secondary | ICD-10-CM | POA: Diagnosis not present

## 2017-04-20 DIAGNOSIS — K219 Gastro-esophageal reflux disease without esophagitis: Secondary | ICD-10-CM | POA: Diagnosis not present

## 2017-04-20 DIAGNOSIS — D649 Anemia, unspecified: Secondary | ICD-10-CM | POA: Diagnosis not present

## 2017-04-20 DIAGNOSIS — I10 Essential (primary) hypertension: Secondary | ICD-10-CM | POA: Diagnosis not present

## 2017-04-20 DIAGNOSIS — J449 Chronic obstructive pulmonary disease, unspecified: Secondary | ICD-10-CM | POA: Diagnosis not present

## 2017-04-20 DIAGNOSIS — E785 Hyperlipidemia, unspecified: Secondary | ICD-10-CM | POA: Diagnosis not present

## 2017-04-20 DIAGNOSIS — I252 Old myocardial infarction: Secondary | ICD-10-CM | POA: Diagnosis not present

## 2017-04-20 DIAGNOSIS — R0602 Shortness of breath: Secondary | ICD-10-CM | POA: Diagnosis not present

## 2017-04-20 DIAGNOSIS — I509 Heart failure, unspecified: Secondary | ICD-10-CM | POA: Diagnosis not present

## 2017-04-20 DIAGNOSIS — E1140 Type 2 diabetes mellitus with diabetic neuropathy, unspecified: Secondary | ICD-10-CM | POA: Diagnosis not present

## 2017-04-20 DIAGNOSIS — N289 Disorder of kidney and ureter, unspecified: Secondary | ICD-10-CM | POA: Diagnosis not present

## 2017-04-20 DIAGNOSIS — N189 Chronic kidney disease, unspecified: Secondary | ICD-10-CM | POA: Diagnosis not present

## 2017-04-20 DIAGNOSIS — R809 Proteinuria, unspecified: Secondary | ICD-10-CM | POA: Diagnosis not present

## 2017-04-20 DIAGNOSIS — G4700 Insomnia, unspecified: Secondary | ICD-10-CM | POA: Diagnosis not present

## 2017-04-20 DIAGNOSIS — E119 Type 2 diabetes mellitus without complications: Secondary | ICD-10-CM | POA: Diagnosis not present

## 2017-04-20 DIAGNOSIS — E875 Hyperkalemia: Secondary | ICD-10-CM | POA: Diagnosis not present

## 2017-04-20 DIAGNOSIS — E113299 Type 2 diabetes mellitus with mild nonproliferative diabetic retinopathy without macular edema, unspecified eye: Secondary | ICD-10-CM | POA: Diagnosis not present

## 2017-04-20 DIAGNOSIS — M545 Low back pain: Secondary | ICD-10-CM | POA: Diagnosis not present

## 2017-04-20 DIAGNOSIS — N179 Acute kidney failure, unspecified: Secondary | ICD-10-CM | POA: Diagnosis not present

## 2017-04-20 NOTE — Telephone Encounter (Signed)
Patient called stating that he had gained 17 pounds over the last 3 weeks and that his current diuretics are not working. Per Dr. Bettina Gavia the patient should go to the hospital. The patient was agreeable and stated that he would go to [place].

## 2017-04-23 DIAGNOSIS — N289 Disorder of kidney and ureter, unspecified: Secondary | ICD-10-CM | POA: Diagnosis not present

## 2017-04-23 DIAGNOSIS — R609 Edema, unspecified: Secondary | ICD-10-CM | POA: Diagnosis not present

## 2017-04-23 DIAGNOSIS — E785 Hyperlipidemia, unspecified: Secondary | ICD-10-CM | POA: Diagnosis not present

## 2017-04-23 DIAGNOSIS — D631 Anemia in chronic kidney disease: Secondary | ICD-10-CM | POA: Diagnosis not present

## 2017-04-23 DIAGNOSIS — R809 Proteinuria, unspecified: Secondary | ICD-10-CM | POA: Diagnosis not present

## 2017-04-23 DIAGNOSIS — N189 Chronic kidney disease, unspecified: Secondary | ICD-10-CM | POA: Diagnosis not present

## 2017-04-23 DIAGNOSIS — G4700 Insomnia, unspecified: Secondary | ICD-10-CM | POA: Diagnosis not present

## 2017-04-23 DIAGNOSIS — M545 Low back pain: Secondary | ICD-10-CM | POA: Diagnosis not present

## 2017-04-23 DIAGNOSIS — E113299 Type 2 diabetes mellitus with mild nonproliferative diabetic retinopathy without macular edema, unspecified eye: Secondary | ICD-10-CM | POA: Diagnosis not present

## 2017-04-23 DIAGNOSIS — I2510 Atherosclerotic heart disease of native coronary artery without angina pectoris: Secondary | ICD-10-CM | POA: Diagnosis not present

## 2017-04-23 DIAGNOSIS — K219 Gastro-esophageal reflux disease without esophagitis: Secondary | ICD-10-CM | POA: Diagnosis not present

## 2017-04-23 DIAGNOSIS — E875 Hyperkalemia: Secondary | ICD-10-CM | POA: Diagnosis not present

## 2017-04-23 DIAGNOSIS — E1140 Type 2 diabetes mellitus with diabetic neuropathy, unspecified: Secondary | ICD-10-CM | POA: Diagnosis not present

## 2017-04-23 DIAGNOSIS — D649 Anemia, unspecified: Secondary | ICD-10-CM | POA: Diagnosis not present

## 2017-04-30 DIAGNOSIS — E785 Hyperlipidemia, unspecified: Secondary | ICD-10-CM | POA: Diagnosis not present

## 2017-04-30 DIAGNOSIS — E113299 Type 2 diabetes mellitus with mild nonproliferative diabetic retinopathy without macular edema, unspecified eye: Secondary | ICD-10-CM | POA: Diagnosis not present

## 2017-04-30 DIAGNOSIS — E119 Type 2 diabetes mellitus without complications: Secondary | ICD-10-CM | POA: Diagnosis not present

## 2017-04-30 DIAGNOSIS — I2510 Atherosclerotic heart disease of native coronary artery without angina pectoris: Secondary | ICD-10-CM | POA: Diagnosis not present

## 2017-04-30 DIAGNOSIS — K219 Gastro-esophageal reflux disease without esophagitis: Secondary | ICD-10-CM | POA: Diagnosis not present

## 2017-04-30 DIAGNOSIS — I509 Heart failure, unspecified: Secondary | ICD-10-CM | POA: Diagnosis not present

## 2017-04-30 DIAGNOSIS — M545 Low back pain: Secondary | ICD-10-CM | POA: Diagnosis not present

## 2017-04-30 DIAGNOSIS — E1140 Type 2 diabetes mellitus with diabetic neuropathy, unspecified: Secondary | ICD-10-CM | POA: Diagnosis not present

## 2017-04-30 DIAGNOSIS — D649 Anemia, unspecified: Secondary | ICD-10-CM | POA: Diagnosis not present

## 2017-04-30 DIAGNOSIS — G4700 Insomnia, unspecified: Secondary | ICD-10-CM | POA: Diagnosis not present

## 2017-04-30 DIAGNOSIS — R809 Proteinuria, unspecified: Secondary | ICD-10-CM | POA: Diagnosis not present

## 2017-04-30 DIAGNOSIS — N289 Disorder of kidney and ureter, unspecified: Secondary | ICD-10-CM | POA: Diagnosis not present

## 2017-05-03 ENCOUNTER — Other Ambulatory Visit: Payer: Self-pay

## 2017-05-03 DIAGNOSIS — I509 Heart failure, unspecified: Secondary | ICD-10-CM | POA: Diagnosis not present

## 2017-05-03 DIAGNOSIS — D631 Anemia in chronic kidney disease: Secondary | ICD-10-CM | POA: Diagnosis not present

## 2017-05-03 DIAGNOSIS — I4891 Unspecified atrial fibrillation: Secondary | ICD-10-CM | POA: Diagnosis not present

## 2017-05-03 DIAGNOSIS — N184 Chronic kidney disease, stage 4 (severe): Secondary | ICD-10-CM | POA: Diagnosis not present

## 2017-05-03 DIAGNOSIS — R319 Hematuria, unspecified: Secondary | ICD-10-CM | POA: Diagnosis not present

## 2017-05-03 MED ORDER — APIXABAN 5 MG PO TABS: 5.0000 mg | ORAL_TABLET | Freq: Two times a day (BID) | ORAL | 11 refills | Status: DC

## 2017-05-07 ENCOUNTER — Other Ambulatory Visit: Payer: Self-pay | Admitting: Nephrology

## 2017-05-07 DIAGNOSIS — D649 Anemia, unspecified: Secondary | ICD-10-CM | POA: Diagnosis not present

## 2017-05-07 DIAGNOSIS — G4700 Insomnia, unspecified: Secondary | ICD-10-CM | POA: Diagnosis not present

## 2017-05-07 DIAGNOSIS — E785 Hyperlipidemia, unspecified: Secondary | ICD-10-CM | POA: Diagnosis not present

## 2017-05-07 DIAGNOSIS — I2510 Atherosclerotic heart disease of native coronary artery without angina pectoris: Secondary | ICD-10-CM | POA: Diagnosis not present

## 2017-05-07 DIAGNOSIS — N289 Disorder of kidney and ureter, unspecified: Secondary | ICD-10-CM | POA: Diagnosis not present

## 2017-05-07 DIAGNOSIS — R809 Proteinuria, unspecified: Secondary | ICD-10-CM | POA: Diagnosis not present

## 2017-05-07 DIAGNOSIS — I10 Essential (primary) hypertension: Secondary | ICD-10-CM | POA: Diagnosis not present

## 2017-05-07 DIAGNOSIS — E1140 Type 2 diabetes mellitus with diabetic neuropathy, unspecified: Secondary | ICD-10-CM | POA: Diagnosis not present

## 2017-05-07 DIAGNOSIS — I509 Heart failure, unspecified: Secondary | ICD-10-CM | POA: Diagnosis not present

## 2017-05-07 DIAGNOSIS — E113299 Type 2 diabetes mellitus with mild nonproliferative diabetic retinopathy without macular edema, unspecified eye: Secondary | ICD-10-CM | POA: Diagnosis not present

## 2017-05-07 DIAGNOSIS — E119 Type 2 diabetes mellitus without complications: Secondary | ICD-10-CM | POA: Diagnosis not present

## 2017-05-07 DIAGNOSIS — K219 Gastro-esophageal reflux disease without esophagitis: Secondary | ICD-10-CM | POA: Diagnosis not present

## 2017-05-07 DIAGNOSIS — N184 Chronic kidney disease, stage 4 (severe): Secondary | ICD-10-CM

## 2017-05-08 NOTE — Progress Notes (Signed)
Cardiology Office Note:    Date:  05/10/2017   ID:  James Chan, DOB 07-18-1950, MRN [id number]  PCP:  Cher Nakai, MD  Cardiologist:  Shirlee More, MD    Referring MD: Cher Nakai, MD    ASSESSMENT:    1. Chronic diastolic heart failure ([place])   2. Hypertensive heart failure (H[facility])   3. Stage 4 chronic kidney disease ([place])   4. PAF (paroxysmal atrial fibrillation) ([place])   5. Chronic anticoagulation   6. On amiodarone therapy    PLAN:    In order of problems listed above:  1. Worsened decompensated he will increase oral diuretics in an attempt to improve over the weekend but likely needs admission and supervised diuresis in the hospital. 2. Blood pressure poorly controlled at this time being managed by nephrology the stopped his vasodilator hydralazine. 3. Worsened managed by nephrology 4. Stable maintain sinus rhythm continue amiodarone and his current anticoagulant 5. Continue his anticoagulant 6. Continue amiodarone   Next appointment: 05/14/2017   Medication Adjustments/Labs and Tests Ordered: Current medicines are reviewed at length with the patient today.  Concerns regarding medicines are outlined above.  No orders of the defined types were placed in this encounter.  Meds ordered this encounter  Medications  . nitroGLYCERIN (NITROSTAT) 0.4 MG SL tablet    Sig: Place 1 tablet (0.4 mg total) under the tongue every 5 (five) minutes as needed for chest pain.    Dispense:  25 tablet    Refill:  3    No chief complaint on file.   History of Present Illness:    James Chan is a 67 y.o. male with a hx of NSTEMI 04/19/16 with PCI, CAD EF 45% with multiple PCI of RCA for restenosis, brief Paroxysmal Atrial Fibrillation on amiodarone, diastolic CHF with his recent MI, Dyslipidemia, HTN, stage 3 CKD, Peripheral Vascular Disease, CKD and anemia last seen one month ago with decompensated heart failure. He is now on oral iron and has been seen by  hematologist Compliance with diet, lifestyle and medications: Yes Renal function is worsened he has been seen by nephrology Dr. Hollie Salk CK and his PCP diuretic dose decreased he is gained 12 pounds.  He is in overt decompensated heart failure with edema to the waist third heart sound moderate neck vein distention is having orthopnea and PND.  With this combined decompensated heart failure worsened kidney disease I advised him to be admitted to the hospital for supervised diuresis he prefers not we will increase the dose of his torsemide from 60-100 mg/day and reduce it to 80 mg of his weight falls more than 3 pounds in any sequential day.  I will see him back in my office Monday recheck renal function.  If he worsens over the weekend he will present to the emergency room.  I do not have access to recent labs but if he has heart failure and significant anemia and iron deficiency current recommendation support treatment with IV iron in view of the reduction and hospitalizations and improvement in quality of life and heart failure patients. Past Medical History:  Diagnosis Date  . Anemia   . Anxiety state, unspecified 09/15/2008   Qualifier: Diagnosis of  By: Bullins CMA, Ami    . Arterial insufficiency of lower extremity ([place]) 05/10/2016  . Arthritis   . BACK PAIN, LUMBAR, CHRONIC 09/15/2008   Qualifier: Diagnosis of  By: Bullins CMA, Ami    . CHF (congestive heart failure) ([place])   . Chronic  kidney disease   . Claudication ([place]) 10/30/2014  . Comprehensive diabetic foot examination, type 2 DM, encounter for [facility]) 09/15/2008   Qualifier: Diagnosis of  By: Bullins CMA, Ami    . DEPRESSIVE DISORDER 09/15/2008   Qualifier: Diagnosis of  By: Bullins CMA, Ami    . Diabetes mellitus without complication ([place])   . Diabetic polyneuropathy associated with diabetes mellitus due to underlying condition ([place]) 06/28/2015  . Dyslipidemia 11/24/2015  . Esophageal reflux 09/15/2008   Qualifier: Diagnosis of  By: Bullins CMA,  Ami    . Glaucoma   . Hammer toes of both feet 06/28/2015  . History of carotid artery stenosis 10/30/2014  . 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 Hyperlipidemia   . Hypertension   . HYPERTRIGLYCERIDEMIA 09/15/2008   Qualifier: Diagnosis of  By: Bullins CMA, Ami    . Myocardial infarction ([place])   . NSTEMI (non-ST elevated myocardial infarction) ([place]) 09/15/2008   Qualifier: Diagnosis of  By: Bullins CMA, Ami    . Onychomycosis due to dermatophyte 06/28/2015  . PAD (peripheral artery disease) ([place]) 10/30/2014  . Stroke [facility])     Past Surgical History:  Procedure Laterality Date  . angioplasty of iliofemoral and iliac artery with stent placement      Current Medications: Current Meds  Medication Sig  . amiodarone (PACERONE) 400 MG tablet Take 200 mg by mouth daily.   Marland Kitchen apixaban (ELIQUIS) 5 MG TABS tablet Take 1 tablet (5 mg total) by mouth 2 (two) times daily. (Patient taking differently: Take 5 mg by mouth daily. )  . atorvastatin (LIPITOR) 40 MG tablet Take 40 mg by mouth daily.  . clopidogrel (PLAVIX) 75 MG tablet Take 75 mg by mouth daily.  Marland Kitchen gabapentin (NEURONTIN) 600 MG tablet Take 300 mg by mouth daily.   . hydrALAZINE (APRESOLINE) 25 MG tablet Take 25 mg by mouth daily.  . insulin detemir (LEVEMIR) 100 UNIT/ML injection Inject 50 Units into the skin 2 (two) times daily.   Marland Kitchen latanoprost (XALATAN) 0.005 % ophthalmic solution Place 1 drop into both eyes at bedtime.  . metFORMIN (GLUCOPHAGE) 850 MG tablet Take 850 mg by mouth 2 (two) times daily with a meal.   . metoprolol (LOPRESSOR) 100 MG tablet Take 100 mg by mouth 2 (two) times daily.  Marland Kitchen oxyCODONE-acetaminophen (PERCOCET/ROXICET) 5-325 MG tablet Take 1 tablet by mouth every 6 (six) hours as needed for severe pain.  Marland Kitchen research study medication Take 1 each by mouth daily.  . sildenafil (VIAGRA) 25 MG tablet Take 1 tablet (25 mg total) daily as needed by mouth for erectile dysfunction.  . torsemide (DEMADEX)  20 MG tablet Take 2 tablets (40 mg total) by mouth 2 (two) times daily. Reduce to 60 mg a day if you weigh < 203 lbs (Patient taking differently: Take 60 mg by mouth daily. Reduce to 60 mg a day if you weigh < 203 lbs)     Allergies:   Patient has no known allergies.   Social History   Socioeconomic History  . Marital status: Married    Spouse name: None  . Number of children: None  . Years of education: None  . Highest education level: None  Social Needs  . Financial resource strain: None  . Food insecurity - worry: None  . Food insecurity - inability: None  . Transportation needs - medical: None  . Transportation needs - non-medical: None  Occupational History  . None  Tobacco Use  . Smoking status:  Former Smoker    Last attempt to quit: 05/09/1994    Years since quitting: 23.0  . Smokeless tobacco: Never Used  Substance and Sexual Activity  . Alcohol use: No  . Drug use: No  . Sexual activity: None  Other Topics Concern  . None  Social History Narrative  . None     Family History: The patient's family history includes Cancer in his father and mother; Heart disease in his father and mother. ROS:   Please see the history of present illness.    All other systems reviewed and are negative.  EKGs/Labs/Other Studies Reviewed:    The following studies were reviewed today: Recent labs and nephrology consultation requested he is yet to have an ultrasound of his kidneys performed  Recent Labs: No results found for requested labs within last 8760 hours.  Recent Lipid Panel No results found for: CHOL, TRIG, HDL, CHOLHDL, VLDL, LDLCALC, LDLDIRECT  Physical Exam:    VS:  BP (!) 182/80 (BP Location: Right Arm, Patient Position: Sitting, Cuff Size: Large)   Pulse (!) 49   Ht 6' (1.829 m)   Wt 212 lb 0.6 oz (96.2 kg)   SpO2 97%   BMI 28.76 kg/m     Wt Readings from Last 3 Encounters:  05/10/17 212 lb 0.6 oz (96.2 kg)  04/11/17 209 lb (94.8 kg)  02/28/17 211 lb (95.7  kg)     GEN: pallpor Well nourished, well developed in no acute distress HEENT: Normal NECK: moderate  JVD; No carotid bruits LYMPHATICS: No lymphadenopathy CARDIAC: S3 RRR, no murmurs, rubs, gallops RESPIRATORY:  Clear to auscultation without rales, wheezing or rhonchi  ABDOMEN: Soft, non-tender, non-distended liver is pulsatile MUSCULOSKELETAL:  > 4+ to the waist bilateral edema; No deformity  SKIN: Warm and dry NEUROLOGIC:  Alert and oriented x 3 PSYCHIATRIC:  Normal affect    Signed, Shirlee More, MD  05/10/2017 11:12 AM    [place]

## 2017-05-09 ENCOUNTER — Ambulatory Visit: Payer: PPO | Admitting: Sports Medicine

## 2017-05-10 ENCOUNTER — Ambulatory Visit (INDEPENDENT_AMBULATORY_CARE_PROVIDER_SITE_OTHER): Payer: PPO | Admitting: Cardiology

## 2017-05-10 ENCOUNTER — Other Ambulatory Visit: Payer: Self-pay

## 2017-05-10 ENCOUNTER — Encounter: Payer: Self-pay | Admitting: Cardiology

## 2017-05-10 VITALS — BP 182/80 | HR 49 | Ht 72.0 in | Wt 212.0 lb

## 2017-05-10 DIAGNOSIS — Z7901 Long term (current) use of anticoagulants: Secondary | ICD-10-CM | POA: Diagnosis not present

## 2017-05-10 DIAGNOSIS — I110 Hypertensive heart disease with heart failure: Secondary | ICD-10-CM

## 2017-05-10 DIAGNOSIS — I5032 Chronic diastolic (congestive) heart failure: Secondary | ICD-10-CM | POA: Diagnosis not present

## 2017-05-10 DIAGNOSIS — Z79899 Other long term (current) drug therapy: Secondary | ICD-10-CM

## 2017-05-10 DIAGNOSIS — I480 Paroxysmal atrial fibrillation: Secondary | ICD-10-CM | POA: Diagnosis not present

## 2017-05-10 DIAGNOSIS — N184 Chronic kidney disease, stage 4 (severe): Secondary | ICD-10-CM | POA: Diagnosis not present

## 2017-05-10 MED ORDER — NITROGLYCERIN 0.4 MG SL SUBL: 0.4000 mg | SUBLINGUAL_TABLET | SUBLINGUAL | 3 refills | Status: DC | PRN

## 2017-05-10 NOTE — Patient Instructions (Addendum)
Medication Instructions:  Your physician has recommended you make the following change in your medication:  CHANGE demadex take 3 tablets (60 mg) by mouth in the morning and 2 tablets (40 mg) in the evening daily. Reduce to 2 tables twice daily (80 mg total) if you loose more than 3 pounds in a day.  Labwork: None  Testing/Procedures: None  Follow-Up: Your physician recommends that you schedule a follow-up appointment in: Monday 01/21  Any Other Special Instructions Will Be Listed Below (If Applicable).     If you need a refill on your cardiac medications before your next appointment, please call your pharmacy.   [place], RN, BSN  Heart Failure  Weigh yourself every morning when you first wake up and record on a calender or note pad, bring this to your office visits. Using a pill tender can help with taking your medications consistently.  Limit your fluid intake to 2 liters daily  Limit your sodium intake to less than 2-3 grams daily. Ask if you need dietary teaching.  If you gain more than 3 pounds (from your dry weight ), double your dose of diuretic for the day.  If you gain more than 5 pounds (from your dry weight), double your dose of lasix and call your heart failure doctor.  Please do not smoke tobacco since it is very bad for your heart.  Please do not drink alcohol since it can worsen your heart failure.Also avoid OTC nonsteroidal drugs, such as advil, aleve and motrin.  Try to exercise for at least 30 minutes every day because this will help your heart be more efficient. You may be eligible for supervised cardiac rehab, ask your physician.

## 2017-05-14 ENCOUNTER — Encounter: Payer: Self-pay | Admitting: Cardiology

## 2017-05-14 ENCOUNTER — Ambulatory Visit (INDEPENDENT_AMBULATORY_CARE_PROVIDER_SITE_OTHER): Payer: PPO | Admitting: Cardiology

## 2017-05-14 VITALS — BP 166/66 | HR 44 | Ht 72.0 in | Wt 203.0 lb

## 2017-05-14 DIAGNOSIS — I38 Endocarditis, valve unspecified: Secondary | ICD-10-CM

## 2017-05-14 DIAGNOSIS — I480 Paroxysmal atrial fibrillation: Secondary | ICD-10-CM | POA: Diagnosis not present

## 2017-05-14 DIAGNOSIS — K219 Gastro-esophageal reflux disease without esophagitis: Secondary | ICD-10-CM | POA: Diagnosis not present

## 2017-05-14 DIAGNOSIS — I110 Hypertensive heart disease with heart failure: Secondary | ICD-10-CM

## 2017-05-14 DIAGNOSIS — E1140 Type 2 diabetes mellitus with diabetic neuropathy, unspecified: Secondary | ICD-10-CM | POA: Diagnosis not present

## 2017-05-14 DIAGNOSIS — Z1331 Encounter for screening for depression: Secondary | ICD-10-CM | POA: Diagnosis not present

## 2017-05-14 DIAGNOSIS — R001 Bradycardia, unspecified: Secondary | ICD-10-CM

## 2017-05-14 DIAGNOSIS — G4700 Insomnia, unspecified: Secondary | ICD-10-CM | POA: Diagnosis not present

## 2017-05-14 DIAGNOSIS — E113299 Type 2 diabetes mellitus with mild nonproliferative diabetic retinopathy without macular edema, unspecified eye: Secondary | ICD-10-CM | POA: Diagnosis not present

## 2017-05-14 DIAGNOSIS — E785 Hyperlipidemia, unspecified: Secondary | ICD-10-CM | POA: Diagnosis not present

## 2017-05-14 DIAGNOSIS — R809 Proteinuria, unspecified: Secondary | ICD-10-CM | POA: Diagnosis not present

## 2017-05-14 DIAGNOSIS — I5042 Chronic combined systolic (congestive) and diastolic (congestive) heart failure: Secondary | ICD-10-CM

## 2017-05-14 DIAGNOSIS — E875 Hyperkalemia: Secondary | ICD-10-CM | POA: Diagnosis not present

## 2017-05-14 DIAGNOSIS — I509 Heart failure, unspecified: Secondary | ICD-10-CM | POA: Diagnosis not present

## 2017-05-14 DIAGNOSIS — I10 Essential (primary) hypertension: Secondary | ICD-10-CM | POA: Diagnosis not present

## 2017-05-14 DIAGNOSIS — E119 Type 2 diabetes mellitus without complications: Secondary | ICD-10-CM | POA: Diagnosis not present

## 2017-05-14 DIAGNOSIS — I2510 Atherosclerotic heart disease of native coronary artery without angina pectoris: Secondary | ICD-10-CM | POA: Diagnosis not present

## 2017-05-14 MED ORDER — METOPROLOL TARTRATE 100 MG PO TABS: 50.0000 mg | ORAL_TABLET | Freq: Two times a day (BID) | ORAL | 5 refills | Status: DC

## 2017-05-14 MED ORDER — TORSEMIDE 20 MG PO TABS: 40.0000 mg | ORAL_TABLET | Freq: Two times a day (BID) | ORAL | 2 refills | Status: DC

## 2017-05-14 NOTE — Progress Notes (Signed)
Cardiology Office Note:    Date:  05/14/2017   ID:  James Chan, DOB March 30, 1951, MRN [id number]  PCP:  Cher Nakai, MD  Cardiologist:  Shirlee More, MD    Referring MD: Cher Nakai, MD    ASSESSMENT:    1. 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 Hypertensive heart failure ([place])   3. Bradycardia   4. PAF (paroxysmal atrial fibrillation) (HCC)    PLAN:    In order of problems listed above:  1. Improved his home weight is down 12 pounds but still 5-10 pounds above his previous baseline prior to anemia transfusion hospitalization.  Since seen by me he has had a call from his PCP and from nephrology to him to increase dosages of his diuretic however he has been taking 60 mg of torsemide in the morning 40 in the evening.  He is less short of breath he still is not courageous enough to sleep in the bed but is not having PND and still has peripheral edema.  I have asked him to continue this dose of diuretic till he gets a weight of 195 and then reduce to 40 twice daily.  Obviously his heart failure is very volume dependent and has become complicated with worsened CKD I was going to draw labs today including a BMP BNP and a CBC he tells me he seen Dr. Truman Hayward this afternoon and phlebotomy be done in his office.  I will see him back in my office in 3 weeks. 2. Systolic blood pressure is elevated however I am not can adjust medications as he still volume overloaded and I do not want to cause hypotension in the setting of intense diuresis and worsening kidney function.  Note that his hydralazine was stopped by nephrology.  I will let Dr. Hollie Salk take the lead on treating his blood pressure. 3. Sinus bradycardia on EKG with worsened CKD he is accumulating beta-blocker take a break for the next 48 hours reduce it his dose of metoprolol by 50% on hold at home of his rates remain less than 55 4. Stable in sinus rhythm continue low-dose amiodarone and renal dose  reduced anticoagulant   Next appointment: 3 weeks   Medication Adjustments/Labs and Tests Ordered: Current medicines are reviewed at length with the patient today.  Concerns regarding medicines are outlined above.  Orders Placed This Encounter  Procedures  . EKG 12-Lead   Meds ordered this encounter  Medications  . metoprolol tartrate (LOPRESSOR) 100 MG tablet    Sig: Take 0.5 tablets (50 mg total) by mouth 2 (two) times daily. Stop today, do not take if rate < 55, resume on 05/16/17    Dispense:  30 tablet    Refill:  5  . torsemide (DEMADEX) 20 MG tablet    Sig: Take 2 tablets (40 mg total) by mouth 2 (two) times daily. You are taking a total of 5 daily =100 mg, reduce to 2=40mg  twice daily for weight < 195    Dispense:  120 tablet    Refill:  2    Chief Complaint  Patient presents with  . Follow-up  . Congestive Heart Failure    History of Present Illness:    James Chan is a 67 y.o. male with a hx of NSTEMI 04/19/16 with PCI, CAD EF 45% with multiple PCI of RCA for restenosis, brief Paroxysmal Atrial Fibrillation on amiodarone, diastolic CHF with his recent MI, Dyslipidemia, HTN, stage 3  CKD, Peripheral Vascular Disease, CKD and anemia last seen 0/17/19 with decompensated heart failure. Compliance with diet, lifestyle and medications: Yes Weight is down 12 pounds he is improved he still is afraid to get into a bed but is not having PND still has peripheral edema but is diminished and he short of breath with activity such as walking in from the parking lot but not walking in the home or ADLs.  No chest pain palpitation or syncope but notes his home heart rates have been running less than 50 bpm.  Since hydralazine was discontinued his systolics have been greater than 160.  I thought that he had been prescribed oral iron but he told me on his own he has been taking it and that his PCP is going to see him this afternoon and follow-up as an anemia. Past Medical History:    Diagnosis Date  . Anemia   . Anxiety state, unspecified 09/15/2008   Qualifier: Diagnosis of  By: Bullins CMA, Ami    . Arterial insufficiency of lower extremity ([place]) 05/10/2016  . Arthritis   . BACK PAIN, LUMBAR, CHRONIC 09/15/2008   Qualifier: Diagnosis of  By: Bullins CMA, Ami    . CHF (congestive heart failure) ([place])   . Chronic kidney disease   . Claudication ([place]) 10/30/2014  . Comprehensive diabetic foot examination, type 2 DM, encounter for [facility]) 09/15/2008   Qualifier: Diagnosis of  By: Bullins CMA, Ami    . DEPRESSIVE DISORDER 09/15/2008   Qualifier: Diagnosis of  By: Bullins CMA, Ami    . Diabetes mellitus without complication ([place])   . Diabetic polyneuropathy associated with diabetes mellitus due to underlying condition ([place]) 06/28/2015  . Dyslipidemia 11/24/2015  . Esophageal reflux 09/15/2008   Qualifier: Diagnosis of  By: Bullins CMA, Ami    . Glaucoma   . Hammer toes of both feet 06/28/2015  . History of carotid artery stenosis 10/30/2014  . 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 Hyperlipidemia   . Hypertension   . HYPERTRIGLYCERIDEMIA 09/15/2008   Qualifier: Diagnosis of  By: Bullins CMA, Ami    . Myocardial infarction ([place])   . NSTEMI (non-ST elevated myocardial infarction) ([place]) 09/15/2008   Qualifier: Diagnosis of  By: Bullins CMA, Ami    . Onychomycosis due to dermatophyte 06/28/2015  . PAD (peripheral artery disease) ([place]) 10/30/2014  . Stroke [facility])     Past Surgical History:  Procedure Laterality Date  . angioplasty of iliofemoral and iliac artery with stent placement      Current Medications: Current Meds  Medication Sig  . amiodarone (PACERONE) 400 MG tablet Take 200 mg by mouth daily.   Marland Kitchen apixaban (ELIQUIS) 5 MG TABS tablet Take 1 tablet (5 mg total) by mouth 2 (two) times daily. (Patient taking differently: Take 5 mg by mouth daily. )  . atorvastatin (LIPITOR) 40 MG tablet Take 40 mg by mouth daily.  . clopidogrel (PLAVIX) 75 MG tablet  Take 75 mg by mouth daily.  Marland Kitchen gabapentin (NEURONTIN) 600 MG tablet Take 300 mg by mouth daily.   . hydrALAZINE (APRESOLINE) 25 MG tablet Take 25 mg by mouth daily.  . insulin detemir (LEVEMIR) 100 UNIT/ML injection Inject 50 Units into the skin 2 (two) times daily.   Marland Kitchen latanoprost (XALATAN) 0.005 % ophthalmic solution Place 1 drop into both eyes at bedtime.  . metFORMIN (GLUCOPHAGE) 850 MG tablet Take 850 mg by mouth 2 (two) times daily with a meal.   . metoprolol tartrate (LOPRESSOR)  100 MG tablet Take 0.5 tablets (50 mg total) by mouth 2 (two) times daily. Stop today, do not take if rate < 55, resume on 05/16/17  . nitroGLYCERIN (NITROSTAT) 0.4 MG SL tablet Place 1 tablet (0.4 mg total) under the tongue every 5 (five) minutes as needed for chest pain.  Marland Kitchen oxyCODONE-acetaminophen (PERCOCET/ROXICET) 5-325 MG tablet Take 1 tablet by mouth every 6 (six) hours as needed for severe pain.  Marland Kitchen research study medication Take 1 each by mouth daily.  . sildenafil (VIAGRA) 25 MG tablet Take 1 tablet (25 mg total) daily as needed by mouth for erectile dysfunction.  . torsemide (DEMADEX) 20 MG tablet Take 2 tablets (40 mg total) by mouth 2 (two) times daily. You are taking a total of 5 daily =100 mg, reduce to 2=40mg  twice daily for weight < 195  . [DISCONTINUED] metoprolol (LOPRESSOR) 100 MG tablet Take 100 mg by mouth 2 (two) times daily.  . [DISCONTINUED] torsemide (DEMADEX) 20 MG tablet Take 2 tablets (40 mg total) by mouth 2 (two) times daily. Reduce to 60 mg a day if you weigh < 203 lbs (Patient taking differently: Take 20 mg by mouth daily. Takes 3 tablets in the am, and 2 tablets in the pm)     Allergies:   Patient has no known allergies.   Social History   Socioeconomic History  . Marital status: Married    Spouse name: None  . Number of children: None  . Years of education: None  . Highest education level: None  Social Needs  . Financial resource strain: None  . Food insecurity - worry: None    . Food insecurity - inability: None  . Transportation needs - medical: None  . Transportation needs - non-medical: None  Occupational History  . None  Tobacco Use  . Smoking status: Former Smoker    Last attempt to quit: 05/09/1994    Years since quitting: 23.0  . Smokeless tobacco: Never Used  Substance and Sexual Activity  . Alcohol use: No  . Drug use: No  . Sexual activity: None  Other Topics Concern  . None  Social History Narrative  . None     Family History: The patient's family history includes Cancer in his father and mother; Heart disease in his father and mother. ROS:   Please see the history of present illness.    All other systems reviewed and are negative.  EKGs/Labs/Other Studies Reviewed:    The following studies were reviewed today:  EKG:  EKG ordered today.  The ekg ordered today demonstrates sinus bradycardia 46 bpm  Recent Labs: No results found for requested labs within last 8760 hours.  Recent Lipid Panel No results found for: CHOL, TRIG, HDL, CHOLHDL, VLDL, LDLCALC, LDLDIRECT  Physical Exam:    VS:  BP (!) 166/66 (Patient Position: Sitting, Cuff Size: Normal)   Pulse (!) 44   Ht 6' (1.829 m)   Wt 203 lb (92.1 kg)   SpO2 98%   BMI 27.53 kg/m     Wt Readings from Last 3 Encounters:  05/14/17 203 lb (92.1 kg)  05/10/17 212 lb 0.6 oz (96.2 kg)  04/11/17 209 lb (94.8 kg)     GEN:  Well nourished, well developed in no acute distress HEENT: Normal NECK: Moderate NVD; No carotid bruits LYMPHATICS: No lymphadenopathy CARDIAC: soft s1RRR, no murmurs, rubs, gallops RESPIRATORY:  Clear to auscultation without rales, wheezing or rhonchi  ABDOMEN: Soft, non-tender, non-distended MUSCULOSKELETAL:  4+ edema to the  knees, improved edema; No deformity  SKIN: Warm and dry NEUROLOGIC:  Alert and oriented x 3 PSYCHIATRIC:  Normal affect    Signed, Shirlee More, MD  05/14/2017 10:25 AM    [facility]

## 2017-05-14 NOTE — Patient Instructions (Addendum)
Medication Instructions:  Your physician has recommended you make the following change in your medication:  CHANGE metoprolol Stop today, do not take if rate < 55, resume on 05/16/17  CHANGE You are taking a total of 5 daily =100 mg, reduce to 2=40mg  twice daily for weight < 195  Labwork: None  Testing/Procedures: You had an EKG today.  Follow-Up: Your physician recommends that you schedule a follow-up appointment in: 3 weeks.  Any Other Special Instructions Will Be Listed Below (If Applicable).     If you need a refill on your cardiac medications before your next appointment, please call your pharmacy.    Heart Failure  Weigh yourself every morning when you first wake up and record on a calender or note pad, bring this to your office visits. Using a pill tender can help with taking your medications consistently.  Limit your fluid intake to 2 liters daily  Limit your sodium intake to less than 2-3 grams daily. Ask if you need dietary teaching.  If you gain more than 3 pounds (from your dry weight ), double your dose of diuretic for the day.  If you gain more than 5 pounds (from your dry weight), double your dose of lasix and call your heart failure doctor.  Please do not smoke tobacco since it is very bad for your heart.  Please do not drink alcohol since it can worsen your heart failure.Also avoid OTC nonsteroidal drugs, such as advil, aleve and motrin.  Try to exercise for at least 30 minutes every day because this will help your heart be more efficient. You may be eligible for supervised cardiac rehab, ask your physician.

## 2017-05-18 DIAGNOSIS — G4700 Insomnia, unspecified: Secondary | ICD-10-CM | POA: Diagnosis not present

## 2017-05-18 DIAGNOSIS — K219 Gastro-esophageal reflux disease without esophagitis: Secondary | ICD-10-CM | POA: Diagnosis not present

## 2017-05-18 DIAGNOSIS — R809 Proteinuria, unspecified: Secondary | ICD-10-CM | POA: Diagnosis not present

## 2017-05-18 DIAGNOSIS — I2510 Atherosclerotic heart disease of native coronary artery without angina pectoris: Secondary | ICD-10-CM | POA: Diagnosis not present

## 2017-05-18 DIAGNOSIS — E1140 Type 2 diabetes mellitus with diabetic neuropathy, unspecified: Secondary | ICD-10-CM | POA: Diagnosis not present

## 2017-05-18 DIAGNOSIS — D631 Anemia in chronic kidney disease: Secondary | ICD-10-CM | POA: Diagnosis not present

## 2017-05-18 DIAGNOSIS — M545 Low back pain: Secondary | ICD-10-CM | POA: Diagnosis not present

## 2017-05-18 DIAGNOSIS — K5909 Other constipation: Secondary | ICD-10-CM | POA: Diagnosis not present

## 2017-05-18 DIAGNOSIS — D649 Anemia, unspecified: Secondary | ICD-10-CM | POA: Diagnosis not present

## 2017-05-18 DIAGNOSIS — E119 Type 2 diabetes mellitus without complications: Secondary | ICD-10-CM | POA: Diagnosis not present

## 2017-05-18 DIAGNOSIS — E785 Hyperlipidemia, unspecified: Secondary | ICD-10-CM | POA: Diagnosis not present

## 2017-05-18 DIAGNOSIS — E113299 Type 2 diabetes mellitus with mild nonproliferative diabetic retinopathy without macular edema, unspecified eye: Secondary | ICD-10-CM | POA: Diagnosis not present

## 2017-05-18 DIAGNOSIS — R319 Hematuria, unspecified: Secondary | ICD-10-CM | POA: Diagnosis not present

## 2017-05-18 DIAGNOSIS — N184 Chronic kidney disease, stage 4 (severe): Secondary | ICD-10-CM | POA: Diagnosis not present

## 2017-05-18 DIAGNOSIS — I509 Heart failure, unspecified: Secondary | ICD-10-CM | POA: Diagnosis not present

## 2017-05-23 DIAGNOSIS — N189 Chronic kidney disease, unspecified: Secondary | ICD-10-CM | POA: Diagnosis not present

## 2017-05-23 DIAGNOSIS — D631 Anemia in chronic kidney disease: Secondary | ICD-10-CM | POA: Diagnosis not present

## 2017-05-30 DIAGNOSIS — D631 Anemia in chronic kidney disease: Secondary | ICD-10-CM | POA: Diagnosis not present

## 2017-05-30 DIAGNOSIS — N189 Chronic kidney disease, unspecified: Secondary | ICD-10-CM | POA: Diagnosis not present

## 2017-06-03 NOTE — Progress Notes (Signed)
Cardiology Office Note:    Date:  06/04/2017   ID:  James Chan, DOB 07-27-1950, MRN [id number]  PCP:  Cher Nakai, MD  Cardiologist:  Shirlee More, MD    Referring MD: Cher Nakai, MD    ASSESSMENT:    1. Chronic diastolic heart failure ([place])   2. Hypertensive heart failure (HCC)   3. Stage 4 chronic kidney disease ([place])   4. PAF (paroxysmal atrial fibrillation) ([place])   5. Chronic anticoagulation   6. On amiodarone therapy   7. Coronary artery disease involving native coronary artery of native heart without angina pectoris    PLAN:    In order of problems listed above:  1. He remains symptomatic volume overload and will increase his diuretics for goal weight of 191. 2. Blood pressure elevated should respond to increase in diuretic continue other medical treatment 3. Stable recheck renal function next week 4. Stable asymptomatic remains in sinus rhythm continue beta-blocker and low-dose amiodarone 5. Continue his anticoagulant 6. Stable continue low-dose amiodarone 7. Stable continue current medical treatment is not having angina   Next appointment: 4 weeks   Medication Adjustments/Labs and Tests Ordered: Current medicines are reviewed at length with the patient today.  Concerns regarding medicines are outlined above.  No orders of the defined types were placed in this encounter.  No orders of the defined types were placed in this encounter.   Chief Complaint  Patient presents with  . Follow-up  . Shortness of Breath  . Congestive Heart Failure    History of Present Illness:    James Chan is a 67 y.o. male with a hx of NSTEMI 04/19/16 with PCI, CAD EF 45% with multiple PCI of RCA for restenosis, brief Paroxysmal Atrial Fibrillation on amiodarone, diastolic CHF with his recent MI, Dyslipidemia, HTN, stage 3 CKD, Peripheral Vascular Disease, CKD and anemia last seen 05/14/17 improved after increased diuretic for decompensated heart  failure.  ASSESSMENT:    1. 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 Hypertensive heart failure ([place])   3. Bradycardia   4. PAF (paroxysmal atrial fibrillation) (HCC)    PLAN:    In order of problems listed above: 8. Improved his home weight is down 12 pounds but still 5-10 pounds above his previous baseline prior to anemia transfusion hospitalization.  Since seen by me he has had a call from his PCP and from nephrology to him to increase dosages of his diuretic however he has been taking 60 mg of torsemide in the morning 40 in the evening.  He is less short of breath he still is not courageous enough to sleep in the bed but is not having PND and still has peripheral edema.  I have asked him to continue this dose of diuretic till he gets a weight of 195 and then reduce to 40 twice daily.  Obviously his heart failure is very volume dependent and has become complicated with worsened CKD I was going to draw labs today including a BMP BNP and a CBC he tells me he seen Dr. Truman Hayward this afternoon and phlebotomy be done in his office.  I will see him back in my office in 3 weeks. 9. Systolic blood pressure is elevated however I am not can adjust medications as he still volume overloaded and I do not want to cause hypotension in the setting of intense diuresis and worsening kidney function.  Note that his hydralazine was stopped by nephrology.  I will let Dr. Hollie Salk take the lead on treating his blood pressure. 10. Sinus bradycardia on EKG with worsened CKD he is accumulating beta-blocker take a break for the next 48 hours reduce it his dose of metoprolol by 50% on hold at home of his rates remain less than 55 11. Stable in sinus rhythm continue low-dose amiodarone and renal dose reduced anticoagulant  . Compliance with diet, lifestyle and medications: Yes His weight is down 20 pounds stable but he still has edema orthopnea and sleeping in a chair.  His  shortness of breath for more than and/or usual activities.  He relates he has had 2 treatments with IV iron and is unaware of recent labs.  With decompensated heart failure fluid overload edema I will increase the dose of his diuretic for goal dry weight of 191 and plan to recheck labs next week prior to visit with his PCP at his office.  He is not having chest pain I do not think he requires an ischemia evaluation at this time.  BP remains elevated and should respond to correction of volume overload Past Medical History:  Diagnosis Date  . Anemia   . Anxiety state, unspecified 09/15/2008   Qualifier: Diagnosis of  By: Bullins CMA, Ami    . Arterial insufficiency of lower extremity ([place]) 05/10/2016  . Arthritis   . BACK PAIN, LUMBAR, CHRONIC 09/15/2008   Qualifier: Diagnosis of  By: Bullins CMA, Ami    . CHF (congestive heart failure) ([place])   . Chronic kidney disease   . Claudication ([place]) 10/30/2014  . Comprehensive diabetic foot examination, type 2 DM, encounter for [facility]) 09/15/2008   Qualifier: Diagnosis of  By: Bullins CMA, Ami    . DEPRESSIVE DISORDER 09/15/2008   Qualifier: Diagnosis of  By: Bullins CMA, Ami    . Diabetes mellitus without complication ([place])   . Diabetic polyneuropathy associated with diabetes mellitus due to underlying condition ([place]) 06/28/2015  . Dyslipidemia 11/24/2015  . Esophageal reflux 09/15/2008   Qualifier: Diagnosis of  By: Bullins CMA, Ami    . Glaucoma   . Hammer toes of both feet 06/28/2015  . History of carotid artery stenosis 10/30/2014  . 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 Hyperlipidemia   . Hypertension   . HYPERTRIGLYCERIDEMIA 09/15/2008   Qualifier: Diagnosis of  By: Bullins CMA, Ami    . Myocardial infarction ([place])   . NSTEMI (non-ST elevated myocardial infarction) ([place]) 09/15/2008   Qualifier: Diagnosis of  By: Bullins CMA, Ami    . Onychomycosis due to dermatophyte 06/28/2015  . PAD (peripheral artery disease) ([place]) 10/30/2014  .  Stroke [facility])     Past Surgical History:  Procedure Laterality Date  . angioplasty of iliofemoral and iliac artery with stent placement      Current Medications: Current Meds  Medication Sig  . amiodarone (PACERONE) 400 MG tablet Take 200 mg by mouth daily.   Marland Kitchen apixaban (ELIQUIS) 5 MG TABS tablet Take 1 tablet (5 mg total) by mouth 2 (two) times daily. (Patient taking differently: Take 5 mg by mouth daily. )  . atorvastatin (LIPITOR) 40 MG tablet Take 40 mg by mouth daily.  . clopidogrel (PLAVIX) 75 MG tablet Take 75 mg by mouth daily.  Marland Kitchen gabapentin (NEURONTIN) 600 MG tablet Take 300 mg by mouth daily.   . hydrALAZINE (APRESOLINE) 25 MG tablet Take 25 mg by mouth 3 (three) times daily.   . insulin detemir (LEVEMIR) 100 UNIT/ML injection Inject 50 Units  into the skin 2 (two) times daily.   Marland Kitchen latanoprost (XALATAN) 0.005 % ophthalmic solution Place 1 drop into both eyes at bedtime.  . metFORMIN (GLUCOPHAGE) 850 MG tablet Take 850 mg by mouth 2 (two) times daily with a meal.   . metoprolol tartrate (LOPRESSOR) 100 MG tablet Take 0.5 tablets (50 mg total) by mouth 2 (two) times daily. Stop today, do not take if rate < 55, resume on 05/16/17 (Patient taking differently: Take 50 mg by mouth daily. Stop today, do not take if rate < 55, resume on 05/16/17)  . nitroGLYCERIN (NITROSTAT) 0.4 MG SL tablet Place 1 tablet (0.4 mg total) under the tongue every 5 (five) minutes as needed for chest pain.  Marland Kitchen oxyCODONE-acetaminophen (PERCOCET/ROXICET) 5-325 MG tablet Take 1 tablet by mouth every 4 (four) hours as needed for moderate pain or severe pain.   Marland Kitchen research study medication Take 1 each by mouth daily.  . sildenafil (VIAGRA) 25 MG tablet Take 1 tablet (25 mg total) daily as needed by mouth for erectile dysfunction.  . torsemide (DEMADEX) 20 MG tablet Take 2 tablets (40 mg total) by mouth 2 (two) times daily. You are taking a total of 5 daily =100 mg, reduce to 2=40mg  twice daily for weight < 195 (Patient  taking differently: Take 40 mg by mouth 2 (two) times daily. You are taking a total of 5 daily =100 mg, reduce to 2=40mg  twice daily for weight < 195, Currently taking 3 tablets in the am, and 2 tablets at lunch)     Allergies:   Patient has no known allergies.   Social History   Socioeconomic History  . Marital status: Married    Spouse name: None  . Number of children: None  . Years of education: None  . Highest education level: None  Social Needs  . Financial resource strain: None  . Food insecurity - worry: None  . Food insecurity - inability: None  . Transportation needs - medical: None  . Transportation needs - non-medical: None  Occupational History  . None  Tobacco Use  . Smoking status: Former Smoker    Last attempt to quit: 05/09/1994    Years since quitting: 23.0  . Smokeless tobacco: Never Used  Substance and Sexual Activity  . Alcohol use: No  . Drug use: No  . Sexual activity: None  Other Topics Concern  . None  Social History Narrative  . None     Family History: The patient's family history includes Cancer in his father and mother; Heart disease in his father and mother. ROS:   Please see the history of present illness.    All other systems reviewed and are negative.  EKGs/Labs/Other Studies Reviewed:    The following studies were reviewed today:   Recent Labs: No results found for requested labs within last 8760 hours.  Recent Lipid Panel No results found for: CHOL, TRIG, HDL, CHOLHDL, VLDL, LDLCALC, LDLDIRECT  Physical Exam:    VS:  BP (!) 152/70 (BP Location: Right Arm, Patient Position: Sitting, Cuff Size: Normal)   Pulse 60   Ht 6' (1.829 m)   Wt 198 lb 6.4 oz (90 kg)   SpO2 98%   BMI 26.91 kg/m     Wt Readings from Last 3 Encounters:  06/04/17 198 lb 6.4 oz (90 kg)  05/14/17 203 lb (92.1 kg)  05/10/17 212 lb 0.6 oz (96.2 kg)     GEN:  Well nourished, well developed in no acute distress HEENT:  Normal NECK: No JVD; No carotid  bruits LYMPHATICS: No lymphadenopathy CARDIAC: soft S1 soft s3 RRR,  RESPIRATORY:  Clear to auscultation without rales, wheezing or rhonchi  ABDOMEN: Soft, non-tender, non-distended MUSCULOSKELETAL:  4+ edema to knees and 2+ sacral  edema; No deformity  SKIN: Warm and dry NEUROLOGIC:  Alert and oriented x 3 PSYCHIATRIC:  Normal affect    Signed, Shirlee More, MD  06/04/2017 9:08 AM    [place]

## 2017-06-04 ENCOUNTER — Encounter: Payer: Self-pay | Admitting: Cardiology

## 2017-06-04 ENCOUNTER — Ambulatory Visit: Payer: Medicare HMO | Admitting: Cardiology

## 2017-06-04 VITALS — BP 152/70 | HR 60 | Ht 72.0 in | Wt 198.4 lb

## 2017-06-04 DIAGNOSIS — Z7901 Long term (current) use of anticoagulants: Secondary | ICD-10-CM

## 2017-06-04 DIAGNOSIS — I110 Hypertensive heart disease with heart failure: Secondary | ICD-10-CM

## 2017-06-04 DIAGNOSIS — I5042 Chronic combined systolic (congestive) and diastolic (congestive) heart failure: Secondary | ICD-10-CM

## 2017-06-04 DIAGNOSIS — I480 Paroxysmal atrial fibrillation: Secondary | ICD-10-CM | POA: Diagnosis not present

## 2017-06-04 DIAGNOSIS — I5032 Chronic diastolic (congestive) heart failure: Secondary | ICD-10-CM

## 2017-06-04 DIAGNOSIS — Z79899 Other long term (current) drug therapy: Secondary | ICD-10-CM

## 2017-06-04 DIAGNOSIS — I2510 Atherosclerotic heart disease of native coronary artery without angina pectoris: Secondary | ICD-10-CM

## 2017-06-04 DIAGNOSIS — N184 Chronic kidney disease, stage 4 (severe): Secondary | ICD-10-CM | POA: Diagnosis not present

## 2017-06-04 DIAGNOSIS — I38 Endocarditis, valve unspecified: Secondary | ICD-10-CM

## 2017-06-04 MED ORDER — TORSEMIDE 20 MG PO TABS: 60.0000 mg | ORAL_TABLET | Freq: Two times a day (BID) | ORAL | 2 refills | Status: DC

## 2017-06-04 MED ORDER — TORSEMIDE 20 MG PO TABS: 60.0000 mg | ORAL_TABLET | Freq: Two times a day (BID) | ORAL | 3 refills | Status: DC

## 2017-06-04 NOTE — Patient Instructions (Signed)
Medication Instructions:  Your physician recommends that you continue on your current medications as directed. Please refer to the Current Medication list given to you today.  Labwork: Your physician recommends that you have the following labs drawn: BNP, BMP and CBC at Dr. Truman Hayward office on Monday 02/18  Testing/Procedures: None  Follow-Up: Your physician recommends that you schedule a follow-up appointment in: 4 weeks  Any Other Special Instructions Will Be Listed Below (If Applicable).     If you need a refill on your cardiac medications before your next appointment, please call your pharmacy.   [place], RN, BSN

## 2017-06-06 ENCOUNTER — Other Ambulatory Visit: Payer: Self-pay

## 2017-06-06 ENCOUNTER — Telehealth: Payer: Self-pay | Admitting: Cardiology

## 2017-06-06 DIAGNOSIS — I5042 Chronic combined systolic (congestive) and diastolic (congestive) heart failure: Principal | ICD-10-CM

## 2017-06-06 DIAGNOSIS — I38 Endocarditis, valve unspecified: Secondary | ICD-10-CM

## 2017-06-06 MED ORDER — TORSEMIDE 20 MG PO TABS: 60.0000 mg | ORAL_TABLET | Freq: Two times a day (BID) | ORAL | 11 refills | Status: DC

## 2017-06-06 MED ORDER — AMIODARONE HCL 200 MG PO TABS: 200.0000 mg | ORAL_TABLET | Freq: Every day | ORAL | 3 refills | Status: DC

## 2017-06-06 NOTE — Addendum Note (Signed)
Addended by: Warner Mccreedy E on: 06/06/2017 05:01 PM   Modules accepted: Orders

## 2017-06-06 NOTE — Telephone Encounter (Signed)
Refill sent.

## 2017-06-06 NOTE — Telephone Encounter (Signed)
Patient needs his torosimide refill sent to [facility] in [place] please

## 2017-06-06 NOTE — Addendum Note (Signed)
Addended by: Warner Mccreedy E on: 06/06/2017 05:04 PM   Modules accepted: Orders

## 2017-06-08 ENCOUNTER — Other Ambulatory Visit: Payer: Self-pay

## 2017-06-08 DIAGNOSIS — I480 Paroxysmal atrial fibrillation: Secondary | ICD-10-CM

## 2017-06-08 DIAGNOSIS — I110 Hypertensive heart disease with heart failure: Secondary | ICD-10-CM

## 2017-06-08 DIAGNOSIS — I5032 Chronic diastolic (congestive) heart failure: Secondary | ICD-10-CM

## 2017-06-08 DIAGNOSIS — I38 Endocarditis, valve unspecified: Secondary | ICD-10-CM

## 2017-06-08 DIAGNOSIS — I5042 Chronic combined systolic (congestive) and diastolic (congestive) heart failure: Principal | ICD-10-CM

## 2017-06-08 MED ORDER — TORSEMIDE 20 MG PO TABS: 60.0000 mg | ORAL_TABLET | Freq: Two times a day (BID) | ORAL | 3 refills | Status: DC

## 2017-06-08 MED ORDER — METOPROLOL TARTRATE 100 MG PO TABS: 50.0000 mg | ORAL_TABLET | Freq: Two times a day (BID) | ORAL | 3 refills | Status: DC

## 2017-06-08 MED ORDER — NITROGLYCERIN 0.4 MG SL SUBL: 0.4000 mg | SUBLINGUAL_TABLET | SUBLINGUAL | 3 refills | Status: DC | PRN

## 2017-06-11 DIAGNOSIS — E113299 Type 2 diabetes mellitus with mild nonproliferative diabetic retinopathy without macular edema, unspecified eye: Secondary | ICD-10-CM | POA: Diagnosis not present

## 2017-06-11 DIAGNOSIS — I2510 Atherosclerotic heart disease of native coronary artery without angina pectoris: Secondary | ICD-10-CM | POA: Diagnosis not present

## 2017-06-11 DIAGNOSIS — E785 Hyperlipidemia, unspecified: Secondary | ICD-10-CM | POA: Diagnosis not present

## 2017-06-11 DIAGNOSIS — D509 Iron deficiency anemia, unspecified: Secondary | ICD-10-CM | POA: Diagnosis not present

## 2017-06-11 DIAGNOSIS — E1140 Type 2 diabetes mellitus with diabetic neuropathy, unspecified: Secondary | ICD-10-CM | POA: Diagnosis not present

## 2017-06-11 DIAGNOSIS — I509 Heart failure, unspecified: Secondary | ICD-10-CM | POA: Diagnosis not present

## 2017-06-11 DIAGNOSIS — G4700 Insomnia, unspecified: Secondary | ICD-10-CM | POA: Diagnosis not present

## 2017-06-11 DIAGNOSIS — N289 Disorder of kidney and ureter, unspecified: Secondary | ICD-10-CM | POA: Diagnosis not present

## 2017-06-11 DIAGNOSIS — E119 Type 2 diabetes mellitus without complications: Secondary | ICD-10-CM | POA: Diagnosis not present

## 2017-06-19 DIAGNOSIS — N289 Disorder of kidney and ureter, unspecified: Secondary | ICD-10-CM | POA: Diagnosis not present

## 2017-06-19 DIAGNOSIS — R809 Proteinuria, unspecified: Secondary | ICD-10-CM | POA: Diagnosis not present

## 2017-06-19 DIAGNOSIS — E1140 Type 2 diabetes mellitus with diabetic neuropathy, unspecified: Secondary | ICD-10-CM | POA: Diagnosis not present

## 2017-06-19 DIAGNOSIS — E113299 Type 2 diabetes mellitus with mild nonproliferative diabetic retinopathy without macular edema, unspecified eye: Secondary | ICD-10-CM | POA: Diagnosis not present

## 2017-06-19 DIAGNOSIS — K219 Gastro-esophageal reflux disease without esophagitis: Secondary | ICD-10-CM | POA: Diagnosis not present

## 2017-06-19 DIAGNOSIS — G4700 Insomnia, unspecified: Secondary | ICD-10-CM | POA: Diagnosis not present

## 2017-06-19 DIAGNOSIS — I2510 Atherosclerotic heart disease of native coronary artery without angina pectoris: Secondary | ICD-10-CM | POA: Diagnosis not present

## 2017-06-19 DIAGNOSIS — E119 Type 2 diabetes mellitus without complications: Secondary | ICD-10-CM | POA: Diagnosis not present

## 2017-06-19 DIAGNOSIS — E785 Hyperlipidemia, unspecified: Secondary | ICD-10-CM | POA: Diagnosis not present

## 2017-07-02 ENCOUNTER — Ambulatory Visit: Payer: Medicare HMO | Admitting: Cardiology

## 2017-07-02 ENCOUNTER — Encounter: Payer: Self-pay | Admitting: Cardiology

## 2017-07-02 VITALS — BP 148/70 | HR 51 | Ht 72.0 in | Wt 193.0 lb

## 2017-07-02 DIAGNOSIS — R001 Bradycardia, unspecified: Secondary | ICD-10-CM | POA: Diagnosis not present

## 2017-07-02 DIAGNOSIS — D509 Iron deficiency anemia, unspecified: Secondary | ICD-10-CM | POA: Diagnosis not present

## 2017-07-02 DIAGNOSIS — Z79899 Other long term (current) drug therapy: Secondary | ICD-10-CM | POA: Diagnosis not present

## 2017-07-02 DIAGNOSIS — I480 Paroxysmal atrial fibrillation: Secondary | ICD-10-CM | POA: Diagnosis not present

## 2017-07-02 DIAGNOSIS — Z7901 Long term (current) use of anticoagulants: Secondary | ICD-10-CM | POA: Diagnosis not present

## 2017-07-02 DIAGNOSIS — E785 Hyperlipidemia, unspecified: Secondary | ICD-10-CM | POA: Diagnosis not present

## 2017-07-02 DIAGNOSIS — I110 Hypertensive heart disease with heart failure: Secondary | ICD-10-CM | POA: Diagnosis not present

## 2017-07-02 HISTORY — DX: Iron deficiency anemia, unspecified: D50.9

## 2017-07-02 HISTORY — DX: Bradycardia, unspecified: R00.1

## 2017-07-02 NOTE — Patient Instructions (Signed)
Medication Instructions:  STOP Eliquis START enteric coated aspirin 81 mg daily  Check your blood pressure and pulse daily and record.  Labwork: Your physician recommends that you have the following labs drawn: CMP, CBC, lipid panel, amiodarone level, ferritin.  Testing/Procedures: You had an EKG today.  Follow-Up: Your physician recommends that you schedule a follow-up appointment in: 3 months.  Any Other Special Instructions Will Be Listed Below (If Applicable).     If you need a refill on your cardiac medications before your next appointment, please call your pharmacy.

## 2017-07-02 NOTE — Progress Notes (Signed)
Cardiology Office Note:    Date:  07/02/2017   ID:  [name], DOB Jun 13, 1950, MRN [id number]  PCP:  Cher Nakai, MD  Cardiologist:  Shirlee More, MD    Referring MD: Cher Nakai, MD    ASSESSMENT:    1. Hypertensive heart failure ([place])   2. PAF (paroxysmal atrial fibrillation) ([place])   3. On amiodarone therapy   4. Bradycardia   5. Chronic anticoagulation   6. Iron deficiency anemia, unspecified iron deficiency anemia type   7. Dyslipidemia    PLAN:    In order of problems listed above:  1. Improved he is titrating his diuretic heart failure is compensated we will continue the same.  For safety he will have his lites and renal function follow-up with nephrology. 2. Stable maintains sinus rhythm low-dose amiodarone check liver and thyroid for toxicity. 3. Continue amiodarone 4. Improved intermittently skips a dose of beta-blocker when his rates are less than 55 5. He elects to discontinue anticoagulant will take dual antiplatelet therapy with CAD and paroxysmal atrial fibrillation 6. Improved after IV iron infusion 7. Check liver function and lipid profile.   Next appointment: 3 months   Medication Adjustments/Labs and Tests Ordered: Current medicines are reviewed at length with the patient today.  Concerns regarding medicines are outlined above.  Orders Placed This Encounter  Procedures  . Comprehensive Metabolic Panel (CMET)  . Lipid Profile  . Amiodarone level  . Ferritin  . CBC  . EKG 12-Lead   No orders of the defined types were placed in this encounter.   Chief Complaint  Patient presents with  . Follow-up    1 month flup appt   . Congestive Heart Failure  . Atrial Fibrillation    " camn I stop eliquis, I bruise and have been taking once daily"    History of Present Illness:    James Chan is a 67 y.o. male with a hx of NSTEMI 04/19/16 with PCI, CAD EF 45% with multiple PCI of RCA for restenosis, brief Paroxysmal Atrial Fibrillation on  amiodarone, diastolic CHF with his recent MI, Dyslipidemia, HTN, stage 3 CKD, Peripheral Vascular Disease, CKD and anemia last seen 06/04/17.  ASSESSMENT:   06/04/17   1. 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 Hypertensive heart failure ([place])   3. Bradycardia   4. PAF (paroxysmal atrial fibrillation) ([place])   PLAN:    1.    Improved his home weight is down 12 pounds but still 5-10 pounds above his previous baseline prior to anemia transfusion hospitalization. Since seen by me he has had a call from his PCP and from nephrology to him to increase dosages of his diuretic however he has been taking 60 mg of torsemide in the morning 40 in the evening. He is less short of breath he still is not courageous enough to sleep in the bed but is not having PND and still has peripheral edema. I have asked him to continue this dose of diuretic till he gets a weight of 195 and then reduce to 40 twice daily. Obviously his heart failure is very volume dependent and has become complicated with worsened CKD I was going to draw labs today including a BMP BNP and a CBC he tells me he seen Dr. Truman Hayward this afternoon and phlebotomy be done in his office. I will see him back in my office in 3 weeks. 2.   Systolic blood pressure is elevated  however I am not can adjust medications as he still volume overloaded and I do not want to cause hypotension in the setting of intense diuresis and worsening kidney function. Note that his hydralazine was stopped by nephrology. I will let Dr. Hollie Salk take the lead on treating his blood pressure. 3.   Sinus bradycardia on EKG with worsened CKD he is accumulating beta-blocker take a break for the next 48 hours reduce it his dose of metoprolol by 50% on hold at home of his rates remain less than 55 4.   Stable in sinus rhythm continue low-dose amiodarone and renal dose reduced anticoagulant   Compliance with diet, lifestyle and  medications: Yes He is doing much better weight is down a total of 28 pounds and he received IV iron.  Unfortunate labs were sent to me by his family doctor but they were sent to be scanned and are unavailable.  He is due to be seen by nephrology in the next few weeks.  He is not having edema chest pain palpitation or syncope is very concerned about bruising on his own he is decreased his anticoagulant once a day that is not effective after discussion of risk and benefits elects to take aspirin and Plavix and stop his anticoagulant while he is on amiodarone maintaining sinus rhythm.  I asked him to check blood pressure and pulse daily and to contact me if he has an abnormal pulse. Past Medical History:  Diagnosis Date  . Anemia   . Anxiety state, unspecified 09/15/2008   Qualifier: Diagnosis of  By: Bullins CMA, Ami    . Arterial insufficiency of lower extremity ([place]) 05/10/2016  . Arthritis   . BACK PAIN, LUMBAR, CHRONIC 09/15/2008   Qualifier: Diagnosis of  By: Bullins CMA, Ami    . CHF (congestive heart failure) ([place])   . Chronic kidney disease   . Claudication ([place]) 10/30/2014  . Comprehensive diabetic foot examination, type 2 DM, encounter for [facility]) 09/15/2008   Qualifier: Diagnosis of  By: Bullins CMA, Ami    . DEPRESSIVE DISORDER 09/15/2008   Qualifier: Diagnosis of  By: Bullins CMA, Ami    . Diabetes mellitus without complication ([place])   . Diabetic polyneuropathy associated with diabetes mellitus due to underlying condition ([place]) 06/28/2015  . Dyslipidemia 11/24/2015  . Esophageal reflux 09/15/2008   Qualifier: Diagnosis of  By: Bullins CMA, Ami    . Glaucoma   . Hammer toes of both feet 06/28/2015  . History of carotid artery stenosis 10/30/2014  . 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 Hyperlipidemia   . Hypertension   . Hypertensive heart failure ([place]) 11/28/2016  . HYPERTRIGLYCERIDEMIA 09/15/2008   Qualifier: Diagnosis of  By: Bullins CMA, Ami    . Myocardial  infarction ([place])   . NSTEMI (non-ST elevated myocardial infarction) ([place]) 09/15/2008   Qualifier: Diagnosis of  By: Bullins CMA, Ami    . Onychomycosis due to dermatophyte 06/28/2015  . PAD (peripheral artery disease) ([place]) 10/30/2014  . Stroke [facility])     Past Surgical History:  Procedure Laterality Date  . angioplasty of iliofemoral and iliac artery with stent placement      Current Medications: Current Meds  Medication Sig  . amiodarone (PACERONE) 200 MG tablet Take 1 tablet (200 mg total) by mouth daily.  Marland Kitchen apixaban (ELIQUIS) 5 MG TABS tablet Take 1 tablet (5 mg total) by mouth 2 (two) times daily. (Patient taking differently: Take 5 mg by mouth daily. )  . atorvastatin (  LIPITOR) 40 MG tablet Take 40 mg by mouth daily.  . clopidogrel (PLAVIX) 75 MG tablet Take 75 mg by mouth daily.  Marland Kitchen gabapentin (NEURONTIN) 600 MG tablet Take 300 mg by mouth daily.   . hydrALAZINE (APRESOLINE) 25 MG tablet Take 25 mg by mouth 3 (three) times daily.   . insulin detemir (LEVEMIR) 100 UNIT/ML injection Inject 50 Units into the skin 2 (two) times daily.   Marland Kitchen latanoprost (XALATAN) 0.005 % ophthalmic solution Place 1 drop into both eyes at bedtime.  . metFORMIN (GLUCOPHAGE) 850 MG tablet Take 850 mg by mouth 2 (two) times daily with a meal.   . metoprolol tartrate (LOPRESSOR) 100 MG tablet Take 0.5 tablets (50 mg total) by mouth 2 (two) times daily. Stop today, do not take if rate < 55, resume on 05/16/17  . nitroGLYCERIN (NITROSTAT) 0.4 MG SL tablet Place 1 tablet (0.4 mg total) under the tongue every 5 (five) minutes as needed for chest pain.  Marland Kitchen oxyCODONE-acetaminophen (PERCOCET/ROXICET) 5-325 MG tablet Take 1 tablet by mouth every 4 (four) hours as needed for moderate pain or severe pain.   Marland Kitchen research study medication Take 1 each by mouth daily.  . sildenafil (VIAGRA) 25 MG tablet Take 1 tablet (25 mg total) daily as needed by mouth for erectile dysfunction.  . torsemide (DEMADEX) 20 MG tablet Take 3 tablets  (60 mg total) by mouth 2 (two) times daily. Take 60 mg twice daily. If you weigh >191 lbs, take 3 am & 2 pm if you weigh <191     Allergies:   Patient has no known allergies.   Social History   Socioeconomic History  . Marital status: Married    Spouse name: None  . Number of children: None  . Years of education: None  . Highest education level: None  Social Needs  . Financial resource strain: None  . Food insecurity - worry: None  . Food insecurity - inability: None  . Transportation needs - medical: None  . Transportation needs - non-medical: None  Occupational History  . None  Tobacco Use  . Smoking status: Former Smoker    Last attempt to quit: 05/09/1994    Years since quitting: 23.1  . Smokeless tobacco: Never Used  Substance and Sexual Activity  . Alcohol use: No  . Drug use: No  . Sexual activity: None  Other Topics Concern  . None  Social History Narrative  . None     Family History: The patient's family history includes Cancer in his father and mother; Heart disease in his father and mother. ROS:   Please see the history of present illness.    All other systems reviewed and are negative.  EKGs/Labs/Other Studies Reviewed:    The following studies were reviewed today:  EKG:  EKG ordered today.  The ekg ordered today demonstrates sinus bradycardia first degree AVB , old inferior MI and repolarization changes  Recent Labs: from Dr Truman Hayward are being scanned and are unavailable at this time No results found for requested labs within last 8760 hours.  Recent Lipid Panel No results found for: CHOL, TRIG, HDL, CHOLHDL, VLDL, LDLCALC, LDLDIRECT  Physical Exam:    VS:  BP (!) 148/70 (BP Location: Right Arm, Patient Position: Sitting, Cuff Size: Normal)   Pulse (!) 51   Ht 6' (1.829 m)   Wt 193 lb (87.5 kg)   SpO2 99%   BMI 26.18 kg/m     Wt Readings from Last 3 Encounters:  07/02/17 193 lb (87.5 kg)  06/04/17 198 lb 6.4 oz (90 kg)  05/14/17 203 lb (92.1  kg)     GEN:  Well nourished, well developed in no acute distress HEENT: Normal NECK: No JVD; No carotid bruits LYMPHATICS: No lymphadenopathy CARDIAC: RRR, no murmurs, rubs, gallops RESPIRATORY:  Clear to auscultation without rales, wheezing or rhonchi  ABDOMEN: Soft, non-tender, non-distended MUSCULOSKELETAL:  1+ bilateral to the knee edema; No deformity  SKIN: Warm and dry NEUROLOGIC:  Alert and oriented x 3 PSYCHIATRIC:  Normal affect    Signed, Shirlee More, MD  07/02/2017 10:45 AM    [place]

## 2017-07-04 LAB — COMPREHENSIVE METABOLIC PANEL
ALBUMIN: 4.2 g/dL (ref 3.6–4.8)
ALT: 12 IU/L (ref 0–44)
AST: 14 IU/L (ref 0–40)
Albumin/Globulin Ratio: 1.6 (ref 1.2–2.2)
Alkaline Phosphatase: 84 IU/L (ref 39–117)
BUN / CREAT RATIO: 23 (ref 10–24)
BUN: 62 mg/dL — ABNORMAL HIGH (ref 8–27)
Bilirubin Total: 0.4 mg/dL (ref 0.0–1.2)
CALCIUM: 9.3 mg/dL (ref 8.6–10.2)
CO2: 23 mmol/L (ref 20–29)
CREATININE: 2.74 mg/dL — AB (ref 0.76–1.27)
Chloride: 101 mmol/L (ref 96–106)
GFR calc Af Amer: 27 mL/min/{1.73_m2} — ABNORMAL LOW (ref >59)
GFR, EST NON AFRICAN AMERICAN: 23 mL/min/{1.73_m2} — AB (ref >59)
Globulin, Total: 2.7 g/dL (ref 1.5–4.5)
Glucose: 95 mg/dL (ref 65–99)
Potassium: 4.8 mmol/L (ref 3.5–5.2)
Sodium: 142 mmol/L (ref 134–144)
Total Protein: 6.9 g/dL (ref 6.0–8.5)

## 2017-07-04 LAB — LIPID PANEL
CHOL/HDL RATIO: 4.6 ratio (ref 0.0–5.0)
Cholesterol, Total: 147 mg/dL (ref 100–199)
HDL: 32 mg/dL — ABNORMAL LOW (ref >39)
LDL CALC: 86 mg/dL (ref 0–99)
TRIGLYCERIDES: 143 mg/dL (ref 0–149)
VLDL Cholesterol Cal: 29 mg/dL (ref 5–40)

## 2017-07-04 LAB — CBC
HEMATOCRIT: 30 % — AB (ref 37.5–51.0)
Hemoglobin: 9.5 g/dL — ABNORMAL LOW (ref 13.0–17.7)
MCH: 29 pg (ref 26.6–33.0)
MCHC: 31.7 g/dL (ref 31.5–35.7)
MCV: 92 fL (ref 79–97)
Platelets: 343 10*3/uL (ref 150–379)
RBC: 3.28 x10E6/uL — ABNORMAL LOW (ref 4.14–5.80)
RDW: 15.2 % (ref 12.3–15.4)
WBC: 9.9 10*3/uL (ref 3.4–10.8)

## 2017-07-04 LAB — FERRITIN: FERRITIN: 526 ng/mL — AB (ref 30–400)

## 2017-07-04 LAB — AMIODARONE LEVEL
Amiodarone, Serum: 1.6 ug/mL (ref 1.0–2.5)
N-DESETHYL-AMIODARONE: 0.8 ug/mL — AB (ref 1.0–2.5)

## 2017-07-09 DIAGNOSIS — I10 Essential (primary) hypertension: Secondary | ICD-10-CM | POA: Diagnosis not present

## 2017-07-09 DIAGNOSIS — E1140 Type 2 diabetes mellitus with diabetic neuropathy, unspecified: Secondary | ICD-10-CM | POA: Diagnosis not present

## 2017-07-09 DIAGNOSIS — I2510 Atherosclerotic heart disease of native coronary artery without angina pectoris: Secondary | ICD-10-CM | POA: Diagnosis not present

## 2017-07-09 DIAGNOSIS — E663 Overweight: Secondary | ICD-10-CM | POA: Diagnosis not present

## 2017-07-09 DIAGNOSIS — N289 Disorder of kidney and ureter, unspecified: Secondary | ICD-10-CM | POA: Diagnosis not present

## 2017-07-09 DIAGNOSIS — K219 Gastro-esophageal reflux disease without esophagitis: Secondary | ICD-10-CM | POA: Diagnosis not present

## 2017-07-09 DIAGNOSIS — E785 Hyperlipidemia, unspecified: Secondary | ICD-10-CM | POA: Diagnosis not present

## 2017-07-09 DIAGNOSIS — E119 Type 2 diabetes mellitus without complications: Secondary | ICD-10-CM | POA: Diagnosis not present

## 2017-07-09 DIAGNOSIS — G4700 Insomnia, unspecified: Secondary | ICD-10-CM | POA: Diagnosis not present

## 2017-07-11 DIAGNOSIS — I129 Hypertensive chronic kidney disease with stage 1 through stage 4 chronic kidney disease, or unspecified chronic kidney disease: Secondary | ICD-10-CM | POA: Diagnosis not present

## 2017-07-11 DIAGNOSIS — R109 Unspecified abdominal pain: Secondary | ICD-10-CM | POA: Diagnosis not present

## 2017-07-11 DIAGNOSIS — E1122 Type 2 diabetes mellitus with diabetic chronic kidney disease: Secondary | ICD-10-CM | POA: Diagnosis not present

## 2017-07-11 DIAGNOSIS — I4891 Unspecified atrial fibrillation: Secondary | ICD-10-CM | POA: Diagnosis not present

## 2017-07-11 DIAGNOSIS — I509 Heart failure, unspecified: Secondary | ICD-10-CM | POA: Diagnosis not present

## 2017-07-11 DIAGNOSIS — N184 Chronic kidney disease, stage 4 (severe): Secondary | ICD-10-CM | POA: Diagnosis not present

## 2017-07-11 DIAGNOSIS — D631 Anemia in chronic kidney disease: Secondary | ICD-10-CM | POA: Diagnosis not present

## 2017-07-19 DIAGNOSIS — I10 Essential (primary) hypertension: Secondary | ICD-10-CM | POA: Diagnosis not present

## 2017-08-06 DIAGNOSIS — E785 Hyperlipidemia, unspecified: Secondary | ICD-10-CM | POA: Diagnosis not present

## 2017-08-06 DIAGNOSIS — R809 Proteinuria, unspecified: Secondary | ICD-10-CM | POA: Diagnosis not present

## 2017-08-06 DIAGNOSIS — E1140 Type 2 diabetes mellitus with diabetic neuropathy, unspecified: Secondary | ICD-10-CM | POA: Diagnosis not present

## 2017-08-06 DIAGNOSIS — K219 Gastro-esophageal reflux disease without esophagitis: Secondary | ICD-10-CM | POA: Diagnosis not present

## 2017-08-06 DIAGNOSIS — I2510 Atherosclerotic heart disease of native coronary artery without angina pectoris: Secondary | ICD-10-CM | POA: Diagnosis not present

## 2017-08-06 DIAGNOSIS — E119 Type 2 diabetes mellitus without complications: Secondary | ICD-10-CM | POA: Diagnosis not present

## 2017-08-06 DIAGNOSIS — G4700 Insomnia, unspecified: Secondary | ICD-10-CM | POA: Diagnosis not present

## 2017-08-06 DIAGNOSIS — E113299 Type 2 diabetes mellitus with mild nonproliferative diabetic retinopathy without macular edema, unspecified eye: Secondary | ICD-10-CM | POA: Diagnosis not present

## 2017-08-06 DIAGNOSIS — R229 Localized swelling, mass and lump, unspecified: Secondary | ICD-10-CM | POA: Diagnosis not present

## 2017-08-15 DIAGNOSIS — L723 Sebaceous cyst: Secondary | ICD-10-CM | POA: Diagnosis not present

## 2017-08-16 DIAGNOSIS — N289 Disorder of kidney and ureter, unspecified: Secondary | ICD-10-CM | POA: Diagnosis not present

## 2017-08-21 DIAGNOSIS — L723 Sebaceous cyst: Secondary | ICD-10-CM | POA: Diagnosis not present

## 2017-08-21 DIAGNOSIS — R2231 Localized swelling, mass and lump, right upper limb: Secondary | ICD-10-CM | POA: Diagnosis not present

## 2017-08-24 DIAGNOSIS — L723 Sebaceous cyst: Secondary | ICD-10-CM | POA: Diagnosis not present

## 2017-09-03 DIAGNOSIS — E1140 Type 2 diabetes mellitus with diabetic neuropathy, unspecified: Secondary | ICD-10-CM | POA: Diagnosis not present

## 2017-09-03 DIAGNOSIS — R809 Proteinuria, unspecified: Secondary | ICD-10-CM | POA: Diagnosis not present

## 2017-09-03 DIAGNOSIS — K219 Gastro-esophageal reflux disease without esophagitis: Secondary | ICD-10-CM | POA: Diagnosis not present

## 2017-09-03 DIAGNOSIS — G4700 Insomnia, unspecified: Secondary | ICD-10-CM | POA: Diagnosis not present

## 2017-09-03 DIAGNOSIS — E119 Type 2 diabetes mellitus without complications: Secondary | ICD-10-CM | POA: Diagnosis not present

## 2017-09-03 DIAGNOSIS — E113299 Type 2 diabetes mellitus with mild nonproliferative diabetic retinopathy without macular edema, unspecified eye: Secondary | ICD-10-CM | POA: Diagnosis not present

## 2017-09-03 DIAGNOSIS — I2510 Atherosclerotic heart disease of native coronary artery without angina pectoris: Secondary | ICD-10-CM | POA: Diagnosis not present

## 2017-09-03 DIAGNOSIS — Z01810 Encounter for preprocedural cardiovascular examination: Secondary | ICD-10-CM | POA: Diagnosis not present

## 2017-09-03 DIAGNOSIS — E785 Hyperlipidemia, unspecified: Secondary | ICD-10-CM | POA: Diagnosis not present

## 2017-09-17 DIAGNOSIS — E785 Hyperlipidemia, unspecified: Secondary | ICD-10-CM | POA: Diagnosis not present

## 2017-09-17 DIAGNOSIS — E119 Type 2 diabetes mellitus without complications: Secondary | ICD-10-CM | POA: Diagnosis not present

## 2017-09-17 DIAGNOSIS — G4733 Obstructive sleep apnea (adult) (pediatric): Secondary | ICD-10-CM | POA: Diagnosis not present

## 2017-09-17 DIAGNOSIS — L720 Epidermal cyst: Secondary | ICD-10-CM | POA: Diagnosis not present

## 2017-09-17 DIAGNOSIS — D649 Anemia, unspecified: Secondary | ICD-10-CM | POA: Diagnosis not present

## 2017-09-17 DIAGNOSIS — E039 Hypothyroidism, unspecified: Secondary | ICD-10-CM | POA: Diagnosis not present

## 2017-09-17 DIAGNOSIS — R2231 Localized swelling, mass and lump, right upper limb: Secondary | ICD-10-CM | POA: Diagnosis not present

## 2017-09-17 DIAGNOSIS — F329 Major depressive disorder, single episode, unspecified: Secondary | ICD-10-CM | POA: Diagnosis not present

## 2017-09-17 DIAGNOSIS — E1140 Type 2 diabetes mellitus with diabetic neuropathy, unspecified: Secondary | ICD-10-CM | POA: Diagnosis not present

## 2017-09-17 DIAGNOSIS — E113299 Type 2 diabetes mellitus with mild nonproliferative diabetic retinopathy without macular edema, unspecified eye: Secondary | ICD-10-CM | POA: Diagnosis not present

## 2017-09-17 DIAGNOSIS — F411 Generalized anxiety disorder: Secondary | ICD-10-CM | POA: Diagnosis not present

## 2017-09-17 DIAGNOSIS — E1122 Type 2 diabetes mellitus with diabetic chronic kidney disease: Secondary | ICD-10-CM | POA: Diagnosis not present

## 2017-09-27 DIAGNOSIS — N184 Chronic kidney disease, stage 4 (severe): Secondary | ICD-10-CM | POA: Diagnosis not present

## 2017-09-27 DIAGNOSIS — E1122 Type 2 diabetes mellitus with diabetic chronic kidney disease: Secondary | ICD-10-CM | POA: Diagnosis not present

## 2017-09-27 DIAGNOSIS — I129 Hypertensive chronic kidney disease with stage 1 through stage 4 chronic kidney disease, or unspecified chronic kidney disease: Secondary | ICD-10-CM | POA: Diagnosis not present

## 2017-09-27 DIAGNOSIS — I509 Heart failure, unspecified: Secondary | ICD-10-CM | POA: Diagnosis not present

## 2017-09-27 DIAGNOSIS — I4891 Unspecified atrial fibrillation: Secondary | ICD-10-CM | POA: Diagnosis not present

## 2017-10-01 DIAGNOSIS — F411 Generalized anxiety disorder: Secondary | ICD-10-CM | POA: Diagnosis not present

## 2017-10-01 DIAGNOSIS — I10 Essential (primary) hypertension: Secondary | ICD-10-CM | POA: Diagnosis not present

## 2017-10-01 DIAGNOSIS — E1140 Type 2 diabetes mellitus with diabetic neuropathy, unspecified: Secondary | ICD-10-CM | POA: Diagnosis not present

## 2017-10-01 DIAGNOSIS — E119 Type 2 diabetes mellitus without complications: Secondary | ICD-10-CM | POA: Diagnosis not present

## 2017-10-01 DIAGNOSIS — R609 Edema, unspecified: Secondary | ICD-10-CM | POA: Diagnosis not present

## 2017-10-01 DIAGNOSIS — G4700 Insomnia, unspecified: Secondary | ICD-10-CM | POA: Diagnosis not present

## 2017-10-01 DIAGNOSIS — E113299 Type 2 diabetes mellitus with mild nonproliferative diabetic retinopathy without macular edema, unspecified eye: Secondary | ICD-10-CM | POA: Diagnosis not present

## 2017-10-01 DIAGNOSIS — K219 Gastro-esophageal reflux disease without esophagitis: Secondary | ICD-10-CM | POA: Diagnosis not present

## 2017-10-01 DIAGNOSIS — E785 Hyperlipidemia, unspecified: Secondary | ICD-10-CM | POA: Diagnosis not present

## 2017-10-01 DIAGNOSIS — I2510 Atherosclerotic heart disease of native coronary artery without angina pectoris: Secondary | ICD-10-CM | POA: Diagnosis not present

## 2017-10-01 DIAGNOSIS — M545 Low back pain: Secondary | ICD-10-CM | POA: Diagnosis not present

## 2017-10-01 DIAGNOSIS — R809 Proteinuria, unspecified: Secondary | ICD-10-CM | POA: Diagnosis not present

## 2017-10-01 DIAGNOSIS — R0600 Dyspnea, unspecified: Secondary | ICD-10-CM | POA: Diagnosis not present

## 2017-10-24 DIAGNOSIS — H2513 Age-related nuclear cataract, bilateral: Secondary | ICD-10-CM | POA: Diagnosis not present

## 2017-10-24 DIAGNOSIS — E113591 Type 2 diabetes mellitus with proliferative diabetic retinopathy without macular edema, right eye: Secondary | ICD-10-CM | POA: Diagnosis not present

## 2017-10-24 DIAGNOSIS — H401131 Primary open-angle glaucoma, bilateral, mild stage: Secondary | ICD-10-CM | POA: Diagnosis not present

## 2017-10-24 DIAGNOSIS — E113293 Type 2 diabetes mellitus with mild nonproliferative diabetic retinopathy without macular edema, bilateral: Secondary | ICD-10-CM | POA: Diagnosis not present

## 2017-10-28 NOTE — Progress Notes (Signed)
Cardiology Office Note:    Date:  10/29/2017   ID:  James Chan, DOB August 03, 1950, MRN [id number]  PCP:  Cher Nakai, MD  Cardiologist:  Shirlee More, MD    Referring MD: Cher Nakai, MD    ASSESSMENT:    1. Coronary artery disease involving native coronary artery of native heart without angina pectoris   2. PAF (paroxysmal atrial fibrillation) ([place])   3. On amiodarone therapy   4. Chronic anticoagulation   5. Chronic combined systolic and diastolic heart failure ([place])   6. Hypertensive heart and chronic kidney disease with heart failure and stage 1 through stage 4 chronic kidney disease, or chronic kidney disease ([place])   7. Iron deficiency anemia, unspecified iron deficiency anemia type    PLAN:    In order of problems listed above:  1. Stable continue medical therapy at this time I do not think he requires an ischemia evaluation 2. Stable maintain sinus rhythm on low-dose amiodarone recent labs requested from his PCP to look at liver thyroid kidney function potassium. 3. Continue low-dose amiodarone 4. No longer an anticoagulant taking aspirin 81 mg daily 5. Remains symptomatic but not volume overloaded.  I think much of his problem here is his for some chronic kidney disease I would not increase his diuretic at this time 6. Both hypertension and CKD are worsened managed by nephrology 7. Stable await lipid profile  Echocardiogram ordered he has a prominent aortic outflow murmur but did not have aortic stenosis in2017 and reassess ejection fraction   Next appointment: 3 months   Medication Adjustments/Labs and Tests Ordered: Current medicines are reviewed at length with the patient today.  Concerns regarding medicines are outlined above.  No orders of the defined types were placed in this encounter.  No orders of the defined types were placed in this encounter.   No chief complaint on file.   History of Present Illness:    James Chan is a 67 y.o. male with a  hx of NSTEMI 04/19/16 with PCI of ISR of RCA with 2 DES, CAD EF 45% with multiple PCI of RCA for restenosis, brief  Paroxysmal Atrial Fibrillation on amiodarone,  diastolic CHF , Dyslipidemia, HTN, stage 3 CKD, Peripheral Vascular Disease,  Stage 4 CKD and anemia who was  last seen 07/02/17. He was restarted on ACEI by his nephrologist.. I reviewed the [place]  office note prior to the visit. He is off apixaban due to cost. Compliance with diet, lifestyle and medications: Yes  His weights are stable at home in the range of 190 to 190 pounds but despite this he remains quite short of breath at nighttime with several pillow orthopnea and often sleeps in a chair.  His kidney disease has worsened and he relates that his anemia is stable.  I requested recent labs from Dr. Truman Hayward.  After an ACE inhibitor was initiated he developed hyperkalemia and it was discontinued.  He has intermittent palpitation not severe not sustained maintain sinus rhythm is off anticoagulant due to cost.  He has had no angina exertional shortness of breath syncope or TIA.  He has chosen to take low-dose aspirin for stroke prophylaxis and provided he does not have clinical recurrence of atrial fibrillation I think that is reasonable at this time.  Home blood pressure runs 1 27-7 60 systolic managed by nephrology Past Medical History:  Diagnosis Date  . Anemia   . Anxiety state, unspecified 09/15/2008   Qualifier: Diagnosis of  By: Bullins CMA,  Ami    . Arterial insufficiency of lower extremity ([place]) 05/10/2016  . Arthritis   . BACK PAIN, LUMBAR, CHRONIC 09/15/2008   Qualifier: Diagnosis of  By: Bullins CMA, Ami    . CHF (congestive heart failure) ([place])   . Chronic kidney disease   . Claudication ([place]) 10/30/2014  . Comprehensive diabetic foot examination, type 2 DM, encounter for [facility]) 09/15/2008   Qualifier: Diagnosis of  By: Bullins CMA, Ami    . DEPRESSIVE DISORDER 09/15/2008   Qualifier: Diagnosis of  By: Bullins CMA, Ami    . Diabetes  mellitus without complication ([place])   . Diabetic polyneuropathy associated with diabetes mellitus due to underlying condition ([place]) 06/28/2015  . Dyslipidemia 11/24/2015  . Esophageal reflux 09/15/2008   Qualifier: Diagnosis of  By: Bullins CMA, Ami    . Glaucoma   . Hammer toes of both feet 06/28/2015  . History of carotid artery stenosis 10/30/2014  . 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 Hyperlipidemia   . Hypertension   . Hypertensive heart failure ([place]) 11/28/2016  . HYPERTRIGLYCERIDEMIA 09/15/2008   Qualifier: Diagnosis of  By: Bullins CMA, Ami    . Myocardial infarction ([place])   . NSTEMI (non-ST elevated myocardial infarction) ([place]) 09/15/2008   Qualifier: Diagnosis of  By: Bullins CMA, Ami    . Onychomycosis due to dermatophyte 06/28/2015  . PAD (peripheral artery disease) ([name]) 10/30/2014  . Stroke [facility])     Past Surgical History:  Procedure Laterality Date  . angioplasty of iliofemoral and iliac artery with stent placement      Current Medications: No outpatient medications have been marked as taking for the 10/29/17 encounter (Appointment) with Richardo Priest, MD.     Allergies:   Patient has no known allergies.   Social History   Socioeconomic History  . Marital status: Married    Spouse name: Not on file  . Number of children: Not on file  . Years of education: Not on file  . Highest education level: Not on file  Occupational History  . Not on file  Social Needs  . Financial resource strain: Not on file  . Food insecurity:    Worry: Not on file    Inability: Not on file  . Transportation needs:    Medical: Not on file    Non-medical: Not on file  Tobacco Use  . Smoking status: Former Smoker    Last attempt to quit: 05/09/1994    Years since quitting: 23.4  . Smokeless tobacco: Never Used  Substance and Sexual Activity  . Alcohol use: No  . Drug use: No  . Sexual activity: Not on file  Lifestyle  . Physical activity:    Days per week: Not  on file    Minutes per session: Not on file  . Stress: Not on file  Relationships  . Social connections:    Talks on phone: Not on file    Gets together: Not on file    Attends religious service: Not on file    Active member of club or organization: Not on file    Attends meetings of clubs or organizations: Not on file    Relationship status: Not on file  Other Topics Concern  . Not on file  Social History Narrative  . Not on file     Family History: The patient's family history includes Cancer in his father and mother; Heart disease in his father and mother. ROS:   Please see the  history of present illness.    All other systems reviewed and are negative.  EKGs/Labs/Other Studies Reviewed:    The following studies were reviewed today:  EKG:  EKG ordered today.  The ekg ordered today demonstrates sinus bradycardia 50 bpm left ventricular hypertrophy repolarization nonspecific conduction delay Last CXR on amiodarone 03/29/17 without toxicity Recent Labs:  09/27/17: Cr 2.97, GFR21 cc/min K 4.8  Hgb 9.5 on 07/02/17 07/02/2017: ALT 12; BUN 62; Creatinine, Ser 2.74; Hemoglobin 9.5; Platelets 343; Potassium 4.8; Sodium 142  Recent Lipid Panel    Component Value Date/Time   CHOL 147 07/02/2017 0939   TRIG 143 07/02/2017 0939   HDL 32 (L) 07/02/2017 0939   CHOLHDL 4.6 07/02/2017 0939   LDLCALC 86 07/02/2017 0939    Physical Exam:    VS:  There were no vitals taken for this visit.    Wt Readings from Last 3 Encounters:  07/02/17 193 lb (87.5 kg)  06/04/17 198 lb 6.4 oz (90 kg)  05/14/17 203 lb (92.1 kg)     GEN:  Well nourished, well developed in no acute distress HEENT: Normal NECK: No JVD; No carotid bruits LYMPHATICS: No lymphadenopathy CARDIAC: 3/6 harsh SEM s2 is singleRRR, no murmurs, rubs, gallops RESPIRATORY:  Clear to auscultation without rales, wheezing or rhonchi  ABDOMEN: Soft, non-tender, non-distended MUSCULOSKELETAL:  No edema; No deformity  SKIN: Warm  and dry NEUROLOGIC:  Alert and oriented x 3 PSYCHIATRIC:  Normal affect    Signed, Shirlee More, MD  10/29/2017 8:42 AM    [place]

## 2017-10-29 ENCOUNTER — Ambulatory Visit: Payer: Medicare HMO | Admitting: Cardiology

## 2017-10-29 ENCOUNTER — Encounter: Payer: Self-pay | Admitting: Cardiology

## 2017-10-29 VITALS — BP 184/74 | HR 52 | Ht 72.0 in | Wt 197.0 lb

## 2017-10-29 DIAGNOSIS — Z79899 Other long term (current) drug therapy: Secondary | ICD-10-CM

## 2017-10-29 DIAGNOSIS — I5042 Chronic combined systolic (congestive) and diastolic (congestive) heart failure: Secondary | ICD-10-CM

## 2017-10-29 DIAGNOSIS — Z7901 Long term (current) use of anticoagulants: Secondary | ICD-10-CM | POA: Diagnosis not present

## 2017-10-29 DIAGNOSIS — D509 Iron deficiency anemia, unspecified: Secondary | ICD-10-CM

## 2017-10-29 DIAGNOSIS — I130 Hypertensive heart and chronic kidney disease with heart failure and stage 1 through stage 4 chronic kidney disease, or unspecified chronic kidney disease: Secondary | ICD-10-CM | POA: Diagnosis not present

## 2017-10-29 DIAGNOSIS — I480 Paroxysmal atrial fibrillation: Secondary | ICD-10-CM | POA: Diagnosis not present

## 2017-10-29 DIAGNOSIS — I2510 Atherosclerotic heart disease of native coronary artery without angina pectoris: Secondary | ICD-10-CM | POA: Diagnosis not present

## 2017-10-29 NOTE — Patient Instructions (Signed)
Medication Instructions:  Your physician recommends that you continue on your current medications as directed. Please refer to the Current Medication list given to you today.   Labwork: NONE  Testing/Procedures: You had an EKG today  Your physician has requested that you have an echocardiogram. Echocardiography is a painless test that uses sound waves to create images of your heart. It provides your doctor with information about the size and shape of your heart and how well your heart's chambers and valves are working. This procedure takes approximately one hour. There are no restrictions for this procedure.    Follow-Up: Your physician wants you to follow-up in: 3 months. You will receive a reminder letter in the mail two months in advance. If you don't receive a letter, please call our office to schedule the follow-up appointment.   Any Other Special Instructions Will Be Listed Below (If Applicable).     If you need a refill on your cardiac medications before your next appointment, please call your pharmacy.

## 2017-11-05 DIAGNOSIS — G4700 Insomnia, unspecified: Secondary | ICD-10-CM | POA: Diagnosis not present

## 2017-11-05 DIAGNOSIS — I2510 Atherosclerotic heart disease of native coronary artery without angina pectoris: Secondary | ICD-10-CM | POA: Diagnosis not present

## 2017-11-05 DIAGNOSIS — E113299 Type 2 diabetes mellitus with mild nonproliferative diabetic retinopathy without macular edema, unspecified eye: Secondary | ICD-10-CM | POA: Diagnosis not present

## 2017-11-05 DIAGNOSIS — R609 Edema, unspecified: Secondary | ICD-10-CM | POA: Diagnosis not present

## 2017-11-05 DIAGNOSIS — R809 Proteinuria, unspecified: Secondary | ICD-10-CM | POA: Diagnosis not present

## 2017-11-05 DIAGNOSIS — E1140 Type 2 diabetes mellitus with diabetic neuropathy, unspecified: Secondary | ICD-10-CM | POA: Diagnosis not present

## 2017-11-05 DIAGNOSIS — K219 Gastro-esophageal reflux disease without esophagitis: Secondary | ICD-10-CM | POA: Diagnosis not present

## 2017-11-05 DIAGNOSIS — F329 Major depressive disorder, single episode, unspecified: Secondary | ICD-10-CM | POA: Diagnosis not present

## 2017-11-05 DIAGNOSIS — F411 Generalized anxiety disorder: Secondary | ICD-10-CM | POA: Diagnosis not present

## 2017-11-05 DIAGNOSIS — M545 Low back pain: Secondary | ICD-10-CM | POA: Diagnosis not present

## 2017-11-05 DIAGNOSIS — R5382 Chronic fatigue, unspecified: Secondary | ICD-10-CM | POA: Diagnosis not present

## 2017-11-05 DIAGNOSIS — E039 Hypothyroidism, unspecified: Secondary | ICD-10-CM | POA: Diagnosis not present

## 2017-11-13 DIAGNOSIS — I2510 Atherosclerotic heart disease of native coronary artery without angina pectoris: Secondary | ICD-10-CM | POA: Diagnosis not present

## 2017-11-13 DIAGNOSIS — F411 Generalized anxiety disorder: Secondary | ICD-10-CM | POA: Diagnosis not present

## 2017-11-13 DIAGNOSIS — E039 Hypothyroidism, unspecified: Secondary | ICD-10-CM | POA: Diagnosis not present

## 2017-11-13 DIAGNOSIS — R609 Edema, unspecified: Secondary | ICD-10-CM | POA: Diagnosis not present

## 2017-11-13 DIAGNOSIS — G4700 Insomnia, unspecified: Secondary | ICD-10-CM | POA: Diagnosis not present

## 2017-11-13 DIAGNOSIS — M545 Low back pain: Secondary | ICD-10-CM | POA: Diagnosis not present

## 2017-11-13 DIAGNOSIS — E1140 Type 2 diabetes mellitus with diabetic neuropathy, unspecified: Secondary | ICD-10-CM | POA: Diagnosis not present

## 2017-11-13 DIAGNOSIS — E875 Hyperkalemia: Secondary | ICD-10-CM | POA: Diagnosis not present

## 2017-11-13 DIAGNOSIS — K219 Gastro-esophageal reflux disease without esophagitis: Secondary | ICD-10-CM | POA: Diagnosis not present

## 2017-11-13 DIAGNOSIS — I10 Essential (primary) hypertension: Secondary | ICD-10-CM | POA: Diagnosis not present

## 2017-11-13 DIAGNOSIS — F329 Major depressive disorder, single episode, unspecified: Secondary | ICD-10-CM | POA: Diagnosis not present

## 2017-11-13 DIAGNOSIS — E113299 Type 2 diabetes mellitus with mild nonproliferative diabetic retinopathy without macular edema, unspecified eye: Secondary | ICD-10-CM | POA: Diagnosis not present

## 2017-11-13 DIAGNOSIS — R809 Proteinuria, unspecified: Secondary | ICD-10-CM | POA: Diagnosis not present

## 2017-11-15 ENCOUNTER — Other Ambulatory Visit: Payer: Self-pay

## 2017-11-15 ENCOUNTER — Ambulatory Visit (INDEPENDENT_AMBULATORY_CARE_PROVIDER_SITE_OTHER): Payer: PPO

## 2017-11-15 DIAGNOSIS — I5042 Chronic combined systolic (congestive) and diastolic (congestive) heart failure: Secondary | ICD-10-CM

## 2017-11-15 NOTE — Progress Notes (Signed)
Complete echocardiogram has been performed.  Jimmy Glenmore Karl RDCS 

## 2017-11-19 ENCOUNTER — Other Ambulatory Visit: Payer: Self-pay

## 2017-11-19 ENCOUNTER — Telehealth: Payer: Self-pay | Admitting: Cardiology

## 2017-11-19 MED ORDER — HYDRALAZINE HCL 25 MG PO TABS: 25.0000 mg | ORAL_TABLET | Freq: Three times a day (TID) | ORAL | 1 refills | Status: DC

## 2017-11-19 NOTE — Telephone Encounter (Signed)
Patient is currently out of this medicine   1. Which medications need to be refilled? (please list name of each medication and dose if known) hydralazine 25 mg  2. Which pharmacy/location (including street and city if local pharmacy) is medication to be sent to?[facility] in [place]  3. Do they need a 30 day or 90 day supply? 90 day supply

## 2017-11-19 NOTE — Telephone Encounter (Signed)
Med refill has been sent in.

## 2017-11-26 DIAGNOSIS — I509 Heart failure, unspecified: Secondary | ICD-10-CM | POA: Diagnosis not present

## 2017-11-26 DIAGNOSIS — I4891 Unspecified atrial fibrillation: Secondary | ICD-10-CM | POA: Diagnosis not present

## 2017-11-26 DIAGNOSIS — N184 Chronic kidney disease, stage 4 (severe): Secondary | ICD-10-CM | POA: Diagnosis not present

## 2017-11-26 DIAGNOSIS — E1122 Type 2 diabetes mellitus with diabetic chronic kidney disease: Secondary | ICD-10-CM | POA: Diagnosis not present

## 2017-11-26 DIAGNOSIS — D631 Anemia in chronic kidney disease: Secondary | ICD-10-CM | POA: Diagnosis not present

## 2017-11-26 DIAGNOSIS — I129 Hypertensive chronic kidney disease with stage 1 through stage 4 chronic kidney disease, or unspecified chronic kidney disease: Secondary | ICD-10-CM | POA: Diagnosis not present

## 2017-11-27 DIAGNOSIS — H25812 Combined forms of age-related cataract, left eye: Secondary | ICD-10-CM | POA: Diagnosis not present

## 2017-11-27 DIAGNOSIS — E113213 Type 2 diabetes mellitus with mild nonproliferative diabetic retinopathy with macular edema, bilateral: Secondary | ICD-10-CM | POA: Diagnosis not present

## 2017-11-27 DIAGNOSIS — Z01818 Encounter for other preprocedural examination: Secondary | ICD-10-CM | POA: Diagnosis not present

## 2017-12-03 DIAGNOSIS — M545 Low back pain: Secondary | ICD-10-CM | POA: Diagnosis not present

## 2017-12-03 DIAGNOSIS — E113299 Type 2 diabetes mellitus with mild nonproliferative diabetic retinopathy without macular edema, unspecified eye: Secondary | ICD-10-CM | POA: Diagnosis not present

## 2017-12-03 DIAGNOSIS — E1140 Type 2 diabetes mellitus with diabetic neuropathy, unspecified: Secondary | ICD-10-CM | POA: Diagnosis not present

## 2017-12-03 DIAGNOSIS — G4700 Insomnia, unspecified: Secondary | ICD-10-CM | POA: Diagnosis not present

## 2017-12-03 DIAGNOSIS — R809 Proteinuria, unspecified: Secondary | ICD-10-CM | POA: Diagnosis not present

## 2017-12-03 DIAGNOSIS — K219 Gastro-esophageal reflux disease without esophagitis: Secondary | ICD-10-CM | POA: Diagnosis not present

## 2017-12-03 DIAGNOSIS — I2510 Atherosclerotic heart disease of native coronary artery without angina pectoris: Secondary | ICD-10-CM | POA: Diagnosis not present

## 2017-12-03 DIAGNOSIS — D509 Iron deficiency anemia, unspecified: Secondary | ICD-10-CM | POA: Diagnosis not present

## 2017-12-03 DIAGNOSIS — F411 Generalized anxiety disorder: Secondary | ICD-10-CM | POA: Diagnosis not present

## 2017-12-03 DIAGNOSIS — R609 Edema, unspecified: Secondary | ICD-10-CM | POA: Diagnosis not present

## 2017-12-03 DIAGNOSIS — E329 Disease of thymus, unspecified: Secondary | ICD-10-CM | POA: Diagnosis not present

## 2017-12-03 DIAGNOSIS — E119 Type 2 diabetes mellitus without complications: Secondary | ICD-10-CM | POA: Diagnosis not present

## 2017-12-04 DIAGNOSIS — N184 Chronic kidney disease, stage 4 (severe): Secondary | ICD-10-CM | POA: Diagnosis not present

## 2017-12-04 DIAGNOSIS — D631 Anemia in chronic kidney disease: Secondary | ICD-10-CM | POA: Diagnosis not present

## 2017-12-07 DIAGNOSIS — L723 Sebaceous cyst: Secondary | ICD-10-CM | POA: Diagnosis not present

## 2017-12-13 DIAGNOSIS — E113412 Type 2 diabetes mellitus with severe nonproliferative diabetic retinopathy with macular edema, left eye: Secondary | ICD-10-CM | POA: Diagnosis not present

## 2017-12-13 DIAGNOSIS — E113511 Type 2 diabetes mellitus with proliferative diabetic retinopathy with macular edema, right eye: Secondary | ICD-10-CM | POA: Diagnosis not present

## 2017-12-31 ENCOUNTER — Other Ambulatory Visit: Payer: Self-pay | Admitting: Cardiology

## 2017-12-31 DIAGNOSIS — D509 Iron deficiency anemia, unspecified: Secondary | ICD-10-CM | POA: Diagnosis not present

## 2017-12-31 DIAGNOSIS — I10 Essential (primary) hypertension: Secondary | ICD-10-CM | POA: Diagnosis not present

## 2017-12-31 DIAGNOSIS — K219 Gastro-esophageal reflux disease without esophagitis: Secondary | ICD-10-CM | POA: Diagnosis not present

## 2017-12-31 DIAGNOSIS — E113299 Type 2 diabetes mellitus with mild nonproliferative diabetic retinopathy without macular edema, unspecified eye: Secondary | ICD-10-CM | POA: Diagnosis not present

## 2017-12-31 DIAGNOSIS — R809 Proteinuria, unspecified: Secondary | ICD-10-CM | POA: Diagnosis not present

## 2017-12-31 DIAGNOSIS — E119 Type 2 diabetes mellitus without complications: Secondary | ICD-10-CM | POA: Diagnosis not present

## 2017-12-31 DIAGNOSIS — I2510 Atherosclerotic heart disease of native coronary artery without angina pectoris: Secondary | ICD-10-CM | POA: Diagnosis not present

## 2017-12-31 DIAGNOSIS — M545 Low back pain: Secondary | ICD-10-CM | POA: Diagnosis not present

## 2017-12-31 DIAGNOSIS — G4700 Insomnia, unspecified: Secondary | ICD-10-CM | POA: Diagnosis not present

## 2017-12-31 DIAGNOSIS — R609 Edema, unspecified: Secondary | ICD-10-CM | POA: Diagnosis not present

## 2017-12-31 DIAGNOSIS — E1140 Type 2 diabetes mellitus with diabetic neuropathy, unspecified: Secondary | ICD-10-CM | POA: Diagnosis not present

## 2017-12-31 DIAGNOSIS — E785 Hyperlipidemia, unspecified: Secondary | ICD-10-CM | POA: Diagnosis not present

## 2017-12-31 DIAGNOSIS — N289 Disorder of kidney and ureter, unspecified: Secondary | ICD-10-CM | POA: Diagnosis not present

## 2017-12-31 DIAGNOSIS — F411 Generalized anxiety disorder: Secondary | ICD-10-CM | POA: Diagnosis not present

## 2017-12-31 MED ORDER — AMIODARONE HCL 200 MG PO TABS: 200.0000 mg | ORAL_TABLET | Freq: Every day | ORAL | 2 refills | Status: DC

## 2017-12-31 NOTE — Telephone Encounter (Signed)
° ° °  1. Which medications need to be refilled? (please list name of each medication and dose if known) amidarone 200mg   2. Which pharmacy/location (including street and city if local pharmacy) is medication to be sent to? [facility] in [place]  3. Do they need a 30 day or 90 day supply? 90 day supply(please check if 30 or 90 day)

## 2017-12-31 NOTE — Addendum Note (Signed)
Addended by: Austin Miles on: 12/31/2017 12:04 PM   Modules accepted: Orders

## 2017-12-31 NOTE — Telephone Encounter (Signed)
Refill for amiodarone sent to [facility] in [place].

## 2018-01-01 DIAGNOSIS — D649 Anemia, unspecified: Secondary | ICD-10-CM | POA: Diagnosis not present

## 2018-01-01 DIAGNOSIS — I252 Old myocardial infarction: Secondary | ICD-10-CM | POA: Diagnosis not present

## 2018-01-01 DIAGNOSIS — Z7982 Long term (current) use of aspirin: Secondary | ICD-10-CM | POA: Diagnosis not present

## 2018-01-01 DIAGNOSIS — E1136 Type 2 diabetes mellitus with diabetic cataract: Secondary | ICD-10-CM | POA: Diagnosis not present

## 2018-01-01 DIAGNOSIS — E785 Hyperlipidemia, unspecified: Secondary | ICD-10-CM | POA: Diagnosis not present

## 2018-01-01 DIAGNOSIS — G4733 Obstructive sleep apnea (adult) (pediatric): Secondary | ICD-10-CM | POA: Diagnosis not present

## 2018-01-01 DIAGNOSIS — I10 Essential (primary) hypertension: Secondary | ICD-10-CM | POA: Diagnosis not present

## 2018-01-01 DIAGNOSIS — E1140 Type 2 diabetes mellitus with diabetic neuropathy, unspecified: Secondary | ICD-10-CM | POA: Diagnosis not present

## 2018-01-01 DIAGNOSIS — M1990 Unspecified osteoarthritis, unspecified site: Secondary | ICD-10-CM | POA: Diagnosis not present

## 2018-01-01 DIAGNOSIS — E113412 Type 2 diabetes mellitus with severe nonproliferative diabetic retinopathy with macular edema, left eye: Secondary | ICD-10-CM | POA: Diagnosis not present

## 2018-01-01 DIAGNOSIS — H25812 Combined forms of age-related cataract, left eye: Secondary | ICD-10-CM | POA: Diagnosis not present

## 2018-01-01 DIAGNOSIS — Z8673 Personal history of transient ischemic attack (TIA), and cerebral infarction without residual deficits: Secondary | ICD-10-CM | POA: Diagnosis not present

## 2018-01-01 DIAGNOSIS — H2512 Age-related nuclear cataract, left eye: Secondary | ICD-10-CM | POA: Diagnosis not present

## 2018-01-01 DIAGNOSIS — Z794 Long term (current) use of insulin: Secondary | ICD-10-CM | POA: Diagnosis not present

## 2018-01-01 DIAGNOSIS — Z87891 Personal history of nicotine dependence: Secondary | ICD-10-CM | POA: Diagnosis not present

## 2018-01-01 DIAGNOSIS — Z955 Presence of coronary angioplasty implant and graft: Secondary | ICD-10-CM | POA: Diagnosis not present

## 2018-01-01 DIAGNOSIS — I4891 Unspecified atrial fibrillation: Secondary | ICD-10-CM | POA: Diagnosis not present

## 2018-01-01 DIAGNOSIS — H259 Unspecified age-related cataract: Secondary | ICD-10-CM | POA: Diagnosis not present

## 2018-01-01 DIAGNOSIS — I509 Heart failure, unspecified: Secondary | ICD-10-CM | POA: Diagnosis not present

## 2018-01-01 DIAGNOSIS — Z79899 Other long term (current) drug therapy: Secondary | ICD-10-CM | POA: Diagnosis not present

## 2018-01-09 DIAGNOSIS — D631 Anemia in chronic kidney disease: Secondary | ICD-10-CM | POA: Diagnosis not present

## 2018-01-09 DIAGNOSIS — N184 Chronic kidney disease, stage 4 (severe): Secondary | ICD-10-CM | POA: Diagnosis not present

## 2018-01-11 DIAGNOSIS — H401111 Primary open-angle glaucoma, right eye, mild stage: Secondary | ICD-10-CM | POA: Diagnosis not present

## 2018-01-11 DIAGNOSIS — E119 Type 2 diabetes mellitus without complications: Secondary | ICD-10-CM | POA: Diagnosis not present

## 2018-01-18 DIAGNOSIS — H401111 Primary open-angle glaucoma, right eye, mild stage: Secondary | ICD-10-CM | POA: Diagnosis not present

## 2018-01-22 DIAGNOSIS — I129 Hypertensive chronic kidney disease with stage 1 through stage 4 chronic kidney disease, or unspecified chronic kidney disease: Secondary | ICD-10-CM | POA: Diagnosis not present

## 2018-01-22 DIAGNOSIS — E1122 Type 2 diabetes mellitus with diabetic chronic kidney disease: Secondary | ICD-10-CM | POA: Diagnosis not present

## 2018-01-22 DIAGNOSIS — I4891 Unspecified atrial fibrillation: Secondary | ICD-10-CM | POA: Diagnosis not present

## 2018-01-22 DIAGNOSIS — D631 Anemia in chronic kidney disease: Secondary | ICD-10-CM | POA: Diagnosis not present

## 2018-01-22 DIAGNOSIS — N184 Chronic kidney disease, stage 4 (severe): Secondary | ICD-10-CM | POA: Diagnosis not present

## 2018-01-22 DIAGNOSIS — I509 Heart failure, unspecified: Secondary | ICD-10-CM | POA: Diagnosis not present

## 2018-01-28 DIAGNOSIS — E1140 Type 2 diabetes mellitus with diabetic neuropathy, unspecified: Secondary | ICD-10-CM | POA: Diagnosis not present

## 2018-01-28 DIAGNOSIS — R809 Proteinuria, unspecified: Secondary | ICD-10-CM | POA: Diagnosis not present

## 2018-01-28 DIAGNOSIS — N289 Disorder of kidney and ureter, unspecified: Secondary | ICD-10-CM | POA: Diagnosis not present

## 2018-01-28 DIAGNOSIS — K219 Gastro-esophageal reflux disease without esophagitis: Secondary | ICD-10-CM | POA: Diagnosis not present

## 2018-01-28 DIAGNOSIS — E113299 Type 2 diabetes mellitus with mild nonproliferative diabetic retinopathy without macular edema, unspecified eye: Secondary | ICD-10-CM | POA: Diagnosis not present

## 2018-01-28 DIAGNOSIS — I2510 Atherosclerotic heart disease of native coronary artery without angina pectoris: Secondary | ICD-10-CM | POA: Diagnosis not present

## 2018-01-28 DIAGNOSIS — D509 Iron deficiency anemia, unspecified: Secondary | ICD-10-CM | POA: Diagnosis not present

## 2018-01-28 DIAGNOSIS — R3129 Other microscopic hematuria: Secondary | ICD-10-CM | POA: Diagnosis not present

## 2018-01-28 DIAGNOSIS — G4700 Insomnia, unspecified: Secondary | ICD-10-CM | POA: Diagnosis not present

## 2018-01-28 DIAGNOSIS — M545 Low back pain: Secondary | ICD-10-CM | POA: Diagnosis not present

## 2018-01-28 DIAGNOSIS — Z23 Encounter for immunization: Secondary | ICD-10-CM | POA: Diagnosis not present

## 2018-01-28 DIAGNOSIS — Z1339 Encounter for screening examination for other mental health and behavioral disorders: Secondary | ICD-10-CM | POA: Diagnosis not present

## 2018-01-29 DIAGNOSIS — Z87891 Personal history of nicotine dependence: Secondary | ICD-10-CM | POA: Diagnosis not present

## 2018-01-29 DIAGNOSIS — E1140 Type 2 diabetes mellitus with diabetic neuropathy, unspecified: Secondary | ICD-10-CM | POA: Diagnosis not present

## 2018-01-29 DIAGNOSIS — D649 Anemia, unspecified: Secondary | ICD-10-CM | POA: Diagnosis not present

## 2018-01-29 DIAGNOSIS — Z794 Long term (current) use of insulin: Secondary | ICD-10-CM | POA: Diagnosis not present

## 2018-01-29 DIAGNOSIS — Z955 Presence of coronary angioplasty implant and graft: Secondary | ICD-10-CM | POA: Diagnosis not present

## 2018-01-29 DIAGNOSIS — E1136 Type 2 diabetes mellitus with diabetic cataract: Secondary | ICD-10-CM | POA: Diagnosis not present

## 2018-01-29 DIAGNOSIS — M1990 Unspecified osteoarthritis, unspecified site: Secondary | ICD-10-CM | POA: Diagnosis not present

## 2018-01-29 DIAGNOSIS — H2511 Age-related nuclear cataract, right eye: Secondary | ICD-10-CM | POA: Diagnosis not present

## 2018-01-29 DIAGNOSIS — I252 Old myocardial infarction: Secondary | ICD-10-CM | POA: Diagnosis not present

## 2018-01-29 DIAGNOSIS — H401110 Primary open-angle glaucoma, right eye, stage unspecified: Secondary | ICD-10-CM | POA: Diagnosis not present

## 2018-01-29 DIAGNOSIS — G4730 Sleep apnea, unspecified: Secondary | ICD-10-CM | POA: Diagnosis not present

## 2018-01-29 DIAGNOSIS — H401111 Primary open-angle glaucoma, right eye, mild stage: Secondary | ICD-10-CM | POA: Diagnosis not present

## 2018-01-29 DIAGNOSIS — E785 Hyperlipidemia, unspecified: Secondary | ICD-10-CM | POA: Diagnosis not present

## 2018-01-29 DIAGNOSIS — Z7982 Long term (current) use of aspirin: Secondary | ICD-10-CM | POA: Diagnosis not present

## 2018-01-29 DIAGNOSIS — E1139 Type 2 diabetes mellitus with other diabetic ophthalmic complication: Secondary | ICD-10-CM | POA: Diagnosis not present

## 2018-01-29 DIAGNOSIS — E1151 Type 2 diabetes mellitus with diabetic peripheral angiopathy without gangrene: Secondary | ICD-10-CM | POA: Diagnosis not present

## 2018-01-29 DIAGNOSIS — G4733 Obstructive sleep apnea (adult) (pediatric): Secondary | ICD-10-CM | POA: Diagnosis not present

## 2018-01-29 DIAGNOSIS — Z79899 Other long term (current) drug therapy: Secondary | ICD-10-CM | POA: Diagnosis not present

## 2018-01-29 DIAGNOSIS — I10 Essential (primary) hypertension: Secondary | ICD-10-CM | POA: Diagnosis not present

## 2018-01-29 DIAGNOSIS — H25811 Combined forms of age-related cataract, right eye: Secondary | ICD-10-CM | POA: Diagnosis not present

## 2018-01-29 DIAGNOSIS — Z8673 Personal history of transient ischemic attack (TIA), and cerebral infarction without residual deficits: Secondary | ICD-10-CM | POA: Diagnosis not present

## 2018-02-04 NOTE — Progress Notes (Signed)
Cardiology Office Note:    Date:  02/05/2018   ID:  James Chan, DOB 28-Aug-1950, MRN [id number]  PCP:  Cher Nakai, MD  Cardiologist:  Shirlee More, MD    Referring MD: Cher Nakai, MD    ASSESSMENT:    1. Chronic combined systolic and diastolic heart failure ([place])   2. Hypertensive heart and chronic kidney disease with heart failure and stage 1 through stage 4 chronic kidney disease, or chronic kidney disease ([place])   3. Coronary artery disease involving native coronary artery of native heart without angina pectoris   4. PAF (paroxysmal atrial fibrillation) ([place])   5. On amiodarone therapy   6. Chronic anticoagulation   7. Nonrheumatic mitral valve regurgitation   8. Claudication in peripheral vascular disease ([place])    PLAN:    In order of problems listed above:  1. Heart failure is compensators he has no fluid overload New York Heart Association class I to class II his ejection fraction is improved 50 to 55% we will continue his current dose of loop diuretic 2. Blood pressure is worsened he is not taking a beta-blocker frequently because of bradycardia home blood pressures have been running greater than 1 60-1 70 I will start him on bedtime clonidine and he will repeat in the morning if his systolics are greater than 160.  The other alternative would be minoxidil but I am concerned because marked fluid retention with his heart failure and severe kidney disease 3. Stable at this time would not pursue an ischemia evaluation and continue his current medical treatment including peripheral high intensity statin and beta-blocker.  New York Heart Association class I 4. Stable maintaining sinus rhythm on low-dose amiodarone continue the same TSH was normal along with liver function this summer chest x-ray ordered to screen for lung toxicity 5. No longer is anticoagulated with his anemia and CKD 6. Stable moderate on echocardiogram I would not advise intervention 7. Worsened he has great  ambulatory dysfunction with lower extremity weakness and pain he has had multiple PCI's in the past and will do segmental pressures ABIs and PVRs as a baseline and I would only advocate intervention if he was severe with threatened limb viability   Next appointment: 3 months    Medication Adjustments/Labs and Tests Ordered: Current medicines are reviewed at length with the patient today.  Concerns regarding medicines are outlined above.  No orders of the defined types were placed in this encounter.  No orders of the defined types were placed in this encounter.   Chief Complaint  Patient presents with  . Follow-up  . Congestive Heart Failure  . Coronary Artery Disease  . Atrial Fibrillation    History of Present Illness:    James Chan is a 67 y.o. male with a hx of NSTEMI 04/19/16 with PCI of ISR of RCA with 2 DES, CAD EF 45% with multiple PCI of RCA for restenosis, brief  Paroxysmal Atrial Fibrillation on amiodarone,  diastolic CHF , Dyslipidemia, HTN, stage 3 CKD, Peripheral Vascular Disease,  Stage 4 CKD and anemia last seen 10/30/17.  Echocardiogram 10/29/2017 showed ejection fraction 50 to 55% moderate left atrial enlargement mild aortic regurgitation and moderate mitral regurgitation with VRL 0.14 regurgitant volume 35 cc.  Compliance with diet, lifestyle and medications: Yes  In some ways he is improved he is not short of breath no edema orthopnea he has had no angina.  He is also worse and he has increasing fatigue remains anemic with hemoglobin  9 receiving IV iron he tells me his kidney disease has worsened although is not been advised to have access placed for dialysis and he complains of a great deal of difficulty ambulating with lower extremity weakness and pain with known peripheral vascular disease.  Recent labs performed including hemoglobin 9.0 creatinine 3.5 TSH is normal potassium 4.7 cholesterol 161 HDL 34 LDL 101 Past Medical History:  Diagnosis Date  .  Anemia   . Anxiety state, unspecified 09/15/2008   Qualifier: Diagnosis of  By: Bullins CMA, Ami    . Arterial insufficiency of lower extremity ([place]) 05/10/2016  . Arthritis   . BACK PAIN, LUMBAR, CHRONIC 09/15/2008   Qualifier: Diagnosis of  By: Bullins CMA, Ami    . CHF (congestive heart failure) ([place])   . Chronic kidney disease   . Claudication ([place]) 10/30/2014  . Comprehensive diabetic foot examination, type 2 DM, encounter for [facility]) 09/15/2008   Qualifier: Diagnosis of  By: Bullins CMA, Ami    . DEPRESSIVE DISORDER 09/15/2008   Qualifier: Diagnosis of  By: Bullins CMA, Ami    . Diabetes mellitus without complication ([place])   . Diabetic polyneuropathy associated with diabetes mellitus due to underlying condition ([place]) 06/28/2015  . Dyslipidemia 11/24/2015  . Esophageal reflux 09/15/2008   Qualifier: Diagnosis of  By: Bullins CMA, Ami    . Glaucoma   . Hammer toes of both feet 06/28/2015  . History of carotid artery stenosis 10/30/2014  . 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 Hyperlipidemia   . Hypertension   . Hypertensive heart failure ([place]) 11/28/2016  . HYPERTRIGLYCERIDEMIA 09/15/2008   Qualifier: Diagnosis of  By: Bullins CMA, Ami    . Myocardial infarction ([place])   . NSTEMI (non-ST elevated myocardial infarction) ([place]) 09/15/2008   Qualifier: Diagnosis of  By: Bullins CMA, Ami    . Onychomycosis due to dermatophyte 06/28/2015  . PAD (peripheral artery disease) ([place]) 10/30/2014  . Stroke [facility])     Past Surgical History:  Procedure Laterality Date  . angioplasty of iliofemoral and iliac artery with stent placement      Current Medications: Current Meds  Medication Sig  . amiodarone (PACERONE) 200 MG tablet Take 1 tablet (200 mg total) by mouth daily.  Marland Kitchen atorvastatin (LIPITOR) 40 MG tablet Take 40 mg by mouth daily.  . clopidogrel (PLAVIX) 75 MG tablet Take 75 mg by mouth daily.  Marland Kitchen gabapentin (NEURONTIN) 600 MG tablet Take 300 mg by mouth daily.   . hydrALAZINE  (APRESOLINE) 50 MG tablet Take 50 mg by mouth 3 (three) times daily.  . insulin detemir (LEVEMIR) 100 UNIT/ML injection Inject 30 Units into the skin 2 (two) times daily.   Marland Kitchen latanoprost (XALATAN) 0.005 % ophthalmic solution Place 1 drop into both eyes at bedtime.  . metoprolol tartrate (LOPRESSOR) 100 MG tablet Take 0.5 tablets (50 mg total) by mouth 2 (two) times daily. Stop today, do not take if rate < 55, resume on 05/16/17 (Patient taking differently: Take 50 mg by mouth daily. )  . nitroGLYCERIN (NITROSTAT) 0.4 MG SL tablet Place 1 tablet (0.4 mg total) under the tongue every 5 (five) minutes as needed for chest pain.  Marland Kitchen oxyCODONE-acetaminophen (PERCOCET/ROXICET) 5-325 MG tablet Take 1 tablet by mouth every 4 (four) hours as needed for moderate pain or severe pain.   Marland Kitchen PROAIR HFA 108 (90 Base) MCG/ACT inhaler Inhale 2 puffs into the lungs every 6 (six) hours as needed.  . research study medication Take 1 each by  mouth daily.  . sildenafil (VIAGRA) 25 MG tablet Take 1 tablet (25 mg total) daily as needed by mouth for erectile dysfunction.  . torsemide (DEMADEX) 20 MG tablet Take 3 tablets (60 mg total) by mouth 2 (two) times daily. Take 60 mg twice daily. If you weigh >191 lbs, take 3 am & 2 pm if you weigh <191     Allergies:   Patient has no known allergies.   Social History   Socioeconomic History  . Marital status: Married    Spouse name: Not on file  . Number of children: Not on file  . Years of education: Not on file  . Highest education level: Not on file  Occupational History  . Not on file  Social Needs  . Financial resource strain: Not on file  . Food insecurity:    Worry: Not on file    Inability: Not on file  . Transportation needs:    Medical: Not on file    Non-medical: Not on file  Tobacco Use  . Smoking status: Former Smoker    Last attempt to quit: 05/09/1994    Years since quitting: 23.7  . Smokeless tobacco: Never Used  Substance and Sexual Activity  .  Alcohol use: No  . Drug use: No  . Sexual activity: Not on file  Lifestyle  . Physical activity:    Days per week: Not on file    Minutes per session: Not on file  . Stress: Not on file  Relationships  . Social connections:    Talks on phone: Not on file    Gets together: Not on file    Attends religious service: Not on file    Active member of club or organization: Not on file    Attends meetings of clubs or organizations: Not on file    Relationship status: Not on file  Other Topics Concern  . Not on file  Social History Narrative  . Not on file     Family History: The patient's family history includes Cancer in his father and mother; Heart disease in his father and mother. ROS:   Please see the history of present illness.    All other systems reviewed and are negative.  EKGs/Labs/Other Studies Reviewed:    The following studies were reviewed today:  EKG:  EKG ordered today.  The ekg ordered today demonstrates [place] 52 BPM LVH and repolarization  Recent Labs: 07/02/2017: ALT 12; BUN 62; Creatinine, Ser 2.74; Hemoglobin 9.5; Platelets 343; Potassium 4.8; Sodium 142  Recent Lipid Panel    Component Value Date/Time   CHOL 147 07/02/2017 0939   TRIG 143 07/02/2017 0939   HDL 32 (L) 07/02/2017 0939   CHOLHDL 4.6 07/02/2017 0939   LDLCALC 86 07/02/2017 0939    Physical Exam:    VS:  BP (!) 192/86 (BP Location: Right Arm, Patient Position: Sitting, Cuff Size: Normal)   Pulse (!) 52   Ht 6' (1.829 m)   Wt 199 lb 12.8 oz (90.6 kg)   SpO2 98%   BMI 27.10 kg/m     Wt Readings from Last 3 Encounters:  02/05/18 199 lb 12.8 oz (90.6 kg)  10/29/17 197 lb (89.4 kg)  07/02/17 193 lb (87.5 kg)    BP 192/86 both arms GEN: Gray complexion -amiodarone Well nourished, well developed in no acute distress HEENT: Normal NECK: No JVD; No carotid bruits LYMPHATICS: No lymphadenopathy CARDIAC: 2/6 MR RRR, no murmurs, rubs, gallops RESPIRATORY:  Clear to auscultation without  rales, wheezing or rhonchi  ABDOMEN: Soft, non-tender, non-distended MUSCULOSKELETAL:  No edema; No deformity  SKIN: Warm and dry NEUROLOGIC:  Alert and oriented x 3 PSYCHIATRIC:  Normal affect    Signed, Shirlee More, MD  02/05/2018 10:27 AM    [place]

## 2018-02-05 ENCOUNTER — Encounter: Payer: Self-pay | Admitting: Cardiology

## 2018-02-05 ENCOUNTER — Ambulatory Visit: Payer: PPO | Admitting: Cardiology

## 2018-02-05 ENCOUNTER — Ambulatory Visit (HOSPITAL_BASED_OUTPATIENT_CLINIC_OR_DEPARTMENT_OTHER)
Admission: RE | Admit: 2018-02-05 | Discharge: 2018-02-05 | Disposition: A | Discharge status: Home / Self Care | Payer: PPO | Source: Ambulatory Visit | Attending: Cardiology | Admitting: Cardiology

## 2018-02-05 ENCOUNTER — Ambulatory Visit (INDEPENDENT_AMBULATORY_CARE_PROVIDER_SITE_OTHER): Payer: PPO | Admitting: Cardiology

## 2018-02-05 VITALS — BP 192/86 | HR 52 | Ht 72.0 in | Wt 199.8 lb

## 2018-02-05 DIAGNOSIS — Z79899 Other long term (current) drug therapy: Secondary | ICD-10-CM

## 2018-02-05 DIAGNOSIS — I340 Nonrheumatic mitral (valve) insufficiency: Secondary | ICD-10-CM

## 2018-02-05 DIAGNOSIS — I480 Paroxysmal atrial fibrillation: Secondary | ICD-10-CM

## 2018-02-05 DIAGNOSIS — Z7901 Long term (current) use of anticoagulants: Secondary | ICD-10-CM

## 2018-02-05 DIAGNOSIS — I739 Peripheral vascular disease, unspecified: Secondary | ICD-10-CM

## 2018-02-05 DIAGNOSIS — I2510 Atherosclerotic heart disease of native coronary artery without angina pectoris: Secondary | ICD-10-CM | POA: Insufficient documentation

## 2018-02-05 DIAGNOSIS — I700 Atherosclerosis of aorta: Secondary | ICD-10-CM | POA: Insufficient documentation

## 2018-02-05 DIAGNOSIS — R0602 Shortness of breath: Secondary | ICD-10-CM | POA: Diagnosis not present

## 2018-02-05 DIAGNOSIS — I5042 Chronic combined systolic (congestive) and diastolic (congestive) heart failure: Secondary | ICD-10-CM | POA: Insufficient documentation

## 2018-02-05 DIAGNOSIS — I130 Hypertensive heart and chronic kidney disease with heart failure and stage 1 through stage 4 chronic kidney disease, or unspecified chronic kidney disease: Secondary | ICD-10-CM | POA: Diagnosis not present

## 2018-02-05 HISTORY — DX: Nonrheumatic mitral (valve) insufficiency: I34.0

## 2018-02-05 MED ORDER — CLONIDINE HCL 0.1 MG PO TABS: ORAL_TABLET | ORAL | 3 refills | Status: DC

## 2018-02-05 NOTE — Addendum Note (Signed)
Addended by: Austin Miles on: 02/05/2018 03:34 PM   Modules accepted: Orders

## 2018-02-05 NOTE — Patient Instructions (Signed)
Medication Instructions:  Your physician has recommended you make the following change in your medication:   START clonidine (catapres) 0.1 mg: Take 1 tablet in the morning if SBP (top BP number) >160 and take 1 tablet at bedtime daily.   If you need a refill on your cardiac medications before your next appointment, please call your pharmacy.   Lab work: None  Testing/Procedures: You had an EKG today.   A chest x-ray takes a picture of the organs and structures inside the chest, including the heart, lungs, and blood vessels. This test can show several things, including, whether the heart is enlarges; whether fluid is building up in the lungs; and whether pacemaker / defibrillator leads are still in place.  Your physician has requested that you have an ankle brachial index (ABI). During this test an ultrasound and blood pressure cuff are used to evaluate the arteries that supply the arms and legs with blood. Allow thirty minutes for this exam. There are no restrictions or special instructions.  Follow-Up: At [facility], you and your health needs are our priority.  As part of our continuing mission to provide you with exceptional heart care, we have created designated Provider Care Teams.  These Care Teams include your primary Cardiologist (physician) and Advanced Practice Providers (APPs -  Physician Assistants and Nurse Practitioners) who all work together to provide you with the care you need, when you need it. . You will need a follow up appointment in 3 months.  Please call our office 2 months in advance to schedule this appointment.      Clonidine tablets What is this medicine? CLONIDINE (KLOE ni deen) is used to treat high blood pressure. This medicine may be used for other purposes; ask your health care provider or pharmacist if you have questions. COMMON BRAND NAME(S): Catapres What should I tell my health care provider before I take this medicine? They need to know if you  have any of these conditions: -kidney disease -an unusual or allergic reaction to clonidine, other medicines, foods, dyes, or preservatives -pregnant or trying to get pregnant -breast-feeding How should I use this medicine? Take this medicine by mouth with a glass of water. Follow the directions on the prescription label. Take your doses at regular intervals. Do not take your medicine more often than directed. Do not suddenly stop taking this medicine. You must gradually reduce the dose or you may get a dangerous increase in blood pressure. Ask your doctor or health care professional for advice. Talk to your pediatrician regarding the use of this medicine in children. Special care may be needed. Overdosage: If you think you have taken too much of this medicine contact a poison control center or emergency room at once. NOTE: This medicine is only for you. Do not share this medicine with others. What if I miss a dose? If you miss a dose, take it as soon as you can. If it is almost time for your next dose, take only that dose. Do not take double or extra doses. What may interact with this medicine? Do not take this medicine with any of the following medications: -MAOIs like Carbex, Eldepryl, Marplan, Nardil, and Parnate This medicine may also interact with the following medications: -barbiturate medicines for inducing sleep or treating seizures like phenobarbital -certain medicines for blood pressure, heart disease, irregular heart beat -certain medicines for depression, anxiety, or psychotic disturbances -prescription pain medicines This list may not describe all possible interactions. Give your health care provider  a list of all the medicines, herbs, non-prescription drugs, or dietary supplements you use. Also tell them if you smoke, drink alcohol, or use illegal drugs. Some items may interact with your medicine. What should I watch for while using this medicine? Visit your doctor or health care  professional for regular checks on your progress. Check your heart rate and blood pressure regularly while you are taking this medicine. Ask your doctor or health care professional what your heart rate should be and when you should contact him or her. You may get drowsy or dizzy. Do not drive, use machinery, or do anything that needs mental alertness until you know how this medicine affects you. To avoid dizzy or fainting spells, do not stand or sit up quickly, especially if you are an older person. Alcohol can make you more drowsy and dizzy. Avoid alcoholic drinks. Your mouth may get dry. Chewing sugarless gum or sucking hard candy, and drinking plenty of water will help. Do not treat yourself for coughs, colds, or pain while you are taking this medicine without asking your doctor or health care professional for advice. Some ingredients may increase your blood pressure. If you are going to have surgery tell your doctor or health care professional that you are taking this medicine. What side effects may I notice from receiving this medicine? Side effects that you should report to your doctor or health care professional as soon as possible: -allergic reactions like skin rash, itching or hives, swelling of the face, lips, or tongue -anxiety, nervousness -chest pain -depression -fast, irregular heartbeat -swelling of feet or legs -unusually weak or tired Side effects that usually do not require medical attention (report to your doctor or health care professional if they continue or are bothersome): -change in sex drive or performance -constipation -headache This list may not describe all possible side effects. Call your doctor for medical advice about side effects. You may report side effects to FDA at 1-800-FDA-1088. Where should I keep my medicine? Keep out of the reach of children. Store at room temperature between 15 and 30 degrees C (59 and 86 degrees F). Protect from light. Keep container  tightly closed. Throw away any unused medicine after the expiration date. NOTE: This sheet is a summary. It may not cover all possible information. If you have questions about this medicine, talk to your doctor, pharmacist, or health care provider.  2018 Elsevier/Gold Standard (2010-10-05 13:01:28)     Ankle-Brachial Index Test The ankle-brachial index (ABI) test is used to find peripheral vascular disease (PVD). PVD is also known as peripheral arterial disease (PAD). PVD is the blocking or hardening of the arteries anywhere within the circulatory system beyond the heart. PVD is caused by cholesterol deposits in your blood vessels (atherosclerosis). These deposits cause arteries to narrow. The delivery of oxygen to your tissues is impaired as a result. This can cause muscle pain and fatigue. This is called claudication. PVD means there may also be buildup of cholesterol in your:  Heart. This increases the risk of heart attacks.  Brain. This increases the risk of strokes.  The ankle-brachial index test measures the blood flow in your arms and legs. This test also determines if blood vessels in your leg are narrowed by cholesterol deposits. There are additional causes of a reduced ankle-brachial index, such as inflammation of vessels or a clot in the vessels. However, these are much less common than narrowing due to cholesterol deposits. What is being tested? The test is done while  you are lying down and resting. Measurements are taken of the systolic pressure:  In your arm (brachial).  In your ankle at several points along your leg.  Systolic pressure is the pressure inside your arteries when your heart pumps. The measurements are taken several times on both sides. Then, the highest systolic pressure of the ankle is divided by the highest brachial systolic pressure. The result is the ankle-brachial pressure ratio, or ABI. Sometimes this test is repeated after you have exercised on a treadmill  for five minutes. You may have leg pain during the exercise portion of the test if you suffer from PAD. If the index number drops after exercise, this may show that PAD is present. A normal ABI ratio is between 0.9 and 1.4. A value below 0.9 is considered abnormal. This information is not intended to replace advice given to you by your health care provider. Make sure you discuss any questions you have with your health care provider. Document Released: 04/14/2004 Document Revised: 09/16/2015 Document Reviewed: 11/14/2013 Elsevier Interactive Patient Education  [employer].

## 2018-02-06 DIAGNOSIS — N184 Chronic kidney disease, stage 4 (severe): Secondary | ICD-10-CM | POA: Diagnosis not present

## 2018-02-06 DIAGNOSIS — D631 Anemia in chronic kidney disease: Secondary | ICD-10-CM | POA: Diagnosis not present

## 2018-02-12 DIAGNOSIS — N3001 Acute cystitis with hematuria: Secondary | ICD-10-CM | POA: Diagnosis not present

## 2018-02-12 DIAGNOSIS — N401 Enlarged prostate with lower urinary tract symptoms: Secondary | ICD-10-CM | POA: Diagnosis not present

## 2018-02-12 DIAGNOSIS — R311 Benign essential microscopic hematuria: Secondary | ICD-10-CM | POA: Diagnosis not present

## 2018-02-20 DIAGNOSIS — Z0000 Encounter for general adult medical examination without abnormal findings: Secondary | ICD-10-CM | POA: Diagnosis not present

## 2018-02-20 DIAGNOSIS — E785 Hyperlipidemia, unspecified: Secondary | ICD-10-CM | POA: Diagnosis not present

## 2018-02-20 DIAGNOSIS — Z125 Encounter for screening for malignant neoplasm of prostate: Secondary | ICD-10-CM | POA: Diagnosis not present

## 2018-02-21 DIAGNOSIS — I714 Abdominal aortic aneurysm, without rupture: Secondary | ICD-10-CM | POA: Diagnosis not present

## 2018-02-21 DIAGNOSIS — R311 Benign essential microscopic hematuria: Secondary | ICD-10-CM | POA: Diagnosis not present

## 2018-02-21 DIAGNOSIS — K4090 Unilateral inguinal hernia, without obstruction or gangrene, not specified as recurrent: Secondary | ICD-10-CM | POA: Diagnosis not present

## 2018-02-21 DIAGNOSIS — R3129 Other microscopic hematuria: Secondary | ICD-10-CM | POA: Diagnosis not present

## 2018-02-25 DIAGNOSIS — N184 Chronic kidney disease, stage 4 (severe): Secondary | ICD-10-CM | POA: Diagnosis not present

## 2018-02-25 DIAGNOSIS — D509 Iron deficiency anemia, unspecified: Secondary | ICD-10-CM | POA: Diagnosis not present

## 2018-02-25 DIAGNOSIS — I2510 Atherosclerotic heart disease of native coronary artery without angina pectoris: Secondary | ICD-10-CM | POA: Diagnosis not present

## 2018-02-25 DIAGNOSIS — R809 Proteinuria, unspecified: Secondary | ICD-10-CM | POA: Diagnosis not present

## 2018-02-25 DIAGNOSIS — K219 Gastro-esophageal reflux disease without esophagitis: Secondary | ICD-10-CM | POA: Diagnosis not present

## 2018-02-25 DIAGNOSIS — F411 Generalized anxiety disorder: Secondary | ICD-10-CM | POA: Diagnosis not present

## 2018-02-25 DIAGNOSIS — E113299 Type 2 diabetes mellitus with mild nonproliferative diabetic retinopathy without macular edema, unspecified eye: Secondary | ICD-10-CM | POA: Diagnosis not present

## 2018-02-25 DIAGNOSIS — M545 Low back pain: Secondary | ICD-10-CM | POA: Diagnosis not present

## 2018-02-25 DIAGNOSIS — E118 Type 2 diabetes mellitus with unspecified complications: Secondary | ICD-10-CM | POA: Diagnosis not present

## 2018-02-25 DIAGNOSIS — G4700 Insomnia, unspecified: Secondary | ICD-10-CM | POA: Diagnosis not present

## 2018-02-25 DIAGNOSIS — R609 Edema, unspecified: Secondary | ICD-10-CM | POA: Diagnosis not present

## 2018-02-25 DIAGNOSIS — E1140 Type 2 diabetes mellitus with diabetic neuropathy, unspecified: Secondary | ICD-10-CM | POA: Diagnosis not present

## 2018-02-27 DIAGNOSIS — H35353 Cystoid macular degeneration, bilateral: Secondary | ICD-10-CM | POA: Diagnosis not present

## 2018-03-06 DIAGNOSIS — N184 Chronic kidney disease, stage 4 (severe): Secondary | ICD-10-CM | POA: Diagnosis not present

## 2018-03-06 DIAGNOSIS — D631 Anemia in chronic kidney disease: Secondary | ICD-10-CM | POA: Diagnosis not present

## 2018-03-07 DIAGNOSIS — E113511 Type 2 diabetes mellitus with proliferative diabetic retinopathy with macular edema, right eye: Secondary | ICD-10-CM | POA: Diagnosis not present

## 2018-03-07 DIAGNOSIS — E113412 Type 2 diabetes mellitus with severe nonproliferative diabetic retinopathy with macular edema, left eye: Secondary | ICD-10-CM | POA: Diagnosis not present

## 2018-03-13 DIAGNOSIS — E785 Hyperlipidemia, unspecified: Secondary | ICD-10-CM | POA: Diagnosis not present

## 2018-03-13 DIAGNOSIS — D509 Iron deficiency anemia, unspecified: Secondary | ICD-10-CM | POA: Diagnosis not present

## 2018-03-13 DIAGNOSIS — F329 Major depressive disorder, single episode, unspecified: Secondary | ICD-10-CM | POA: Diagnosis not present

## 2018-03-13 DIAGNOSIS — K219 Gastro-esophageal reflux disease without esophagitis: Secondary | ICD-10-CM | POA: Diagnosis not present

## 2018-03-13 DIAGNOSIS — G4700 Insomnia, unspecified: Secondary | ICD-10-CM | POA: Diagnosis not present

## 2018-03-13 DIAGNOSIS — M545 Low back pain: Secondary | ICD-10-CM | POA: Diagnosis not present

## 2018-03-13 DIAGNOSIS — I2510 Atherosclerotic heart disease of native coronary artery without angina pectoris: Secondary | ICD-10-CM | POA: Diagnosis not present

## 2018-03-13 DIAGNOSIS — E113299 Type 2 diabetes mellitus with mild nonproliferative diabetic retinopathy without macular edema, unspecified eye: Secondary | ICD-10-CM | POA: Diagnosis not present

## 2018-03-13 DIAGNOSIS — E118 Type 2 diabetes mellitus with unspecified complications: Secondary | ICD-10-CM | POA: Diagnosis not present

## 2018-03-13 DIAGNOSIS — I10 Essential (primary) hypertension: Secondary | ICD-10-CM | POA: Diagnosis not present

## 2018-03-13 DIAGNOSIS — R609 Edema, unspecified: Secondary | ICD-10-CM | POA: Diagnosis not present

## 2018-03-13 DIAGNOSIS — E1140 Type 2 diabetes mellitus with diabetic neuropathy, unspecified: Secondary | ICD-10-CM | POA: Diagnosis not present

## 2018-03-13 DIAGNOSIS — F411 Generalized anxiety disorder: Secondary | ICD-10-CM | POA: Diagnosis not present

## 2018-03-13 DIAGNOSIS — R809 Proteinuria, unspecified: Secondary | ICD-10-CM | POA: Diagnosis not present

## 2018-03-14 DIAGNOSIS — E113412 Type 2 diabetes mellitus with severe nonproliferative diabetic retinopathy with macular edema, left eye: Secondary | ICD-10-CM | POA: Diagnosis not present

## 2018-03-14 DIAGNOSIS — E113511 Type 2 diabetes mellitus with proliferative diabetic retinopathy with macular edema, right eye: Secondary | ICD-10-CM | POA: Diagnosis not present

## 2018-03-14 DIAGNOSIS — E113512 Type 2 diabetes mellitus with proliferative diabetic retinopathy with macular edema, left eye: Secondary | ICD-10-CM | POA: Diagnosis not present

## 2018-03-27 ENCOUNTER — Ambulatory Visit (INDEPENDENT_AMBULATORY_CARE_PROVIDER_SITE_OTHER): Payer: PPO

## 2018-03-27 DIAGNOSIS — Z79899 Other long term (current) drug therapy: Secondary | ICD-10-CM

## 2018-03-27 DIAGNOSIS — I739 Peripheral vascular disease, unspecified: Secondary | ICD-10-CM | POA: Diagnosis not present

## 2018-03-27 NOTE — Progress Notes (Signed)
ABI has been performed.  Jimmy Lester Crickenberger RDCS, RVT

## 2018-04-01 ENCOUNTER — Telehealth: Payer: Self-pay | Admitting: Emergency Medicine

## 2018-04-01 NOTE — Telephone Encounter (Signed)
Left message for patient to return call regarding results  

## 2018-04-02 ENCOUNTER — Telehealth: Payer: Self-pay | Admitting: Cardiology

## 2018-04-02 NOTE — Telephone Encounter (Signed)
Patient states he needs results from bloodwork and testing

## 2018-04-02 NOTE — Telephone Encounter (Signed)
Patient informed of ABI results and verbalized understanding.

## 2018-04-03 DIAGNOSIS — D631 Anemia in chronic kidney disease: Secondary | ICD-10-CM | POA: Diagnosis not present

## 2018-04-03 DIAGNOSIS — N184 Chronic kidney disease, stage 4 (severe): Secondary | ICD-10-CM | POA: Diagnosis not present

## 2018-04-11 DIAGNOSIS — E113513 Type 2 diabetes mellitus with proliferative diabetic retinopathy with macular edema, bilateral: Secondary | ICD-10-CM | POA: Diagnosis not present

## 2018-04-19 DIAGNOSIS — F411 Generalized anxiety disorder: Secondary | ICD-10-CM | POA: Diagnosis not present

## 2018-04-19 DIAGNOSIS — E113299 Type 2 diabetes mellitus with mild nonproliferative diabetic retinopathy without macular edema, unspecified eye: Secondary | ICD-10-CM | POA: Diagnosis not present

## 2018-04-19 DIAGNOSIS — B359 Dermatophytosis, unspecified: Secondary | ICD-10-CM | POA: Diagnosis not present

## 2018-04-19 DIAGNOSIS — E663 Overweight: Secondary | ICD-10-CM | POA: Diagnosis not present

## 2018-04-19 DIAGNOSIS — M545 Low back pain: Secondary | ICD-10-CM | POA: Diagnosis not present

## 2018-04-19 DIAGNOSIS — E1140 Type 2 diabetes mellitus with diabetic neuropathy, unspecified: Secondary | ICD-10-CM | POA: Diagnosis not present

## 2018-04-19 DIAGNOSIS — Z9181 History of falling: Secondary | ICD-10-CM | POA: Diagnosis not present

## 2018-04-19 DIAGNOSIS — R809 Proteinuria, unspecified: Secondary | ICD-10-CM | POA: Diagnosis not present

## 2018-04-19 DIAGNOSIS — D509 Iron deficiency anemia, unspecified: Secondary | ICD-10-CM | POA: Diagnosis not present

## 2018-04-19 DIAGNOSIS — G4700 Insomnia, unspecified: Secondary | ICD-10-CM | POA: Diagnosis not present

## 2018-04-19 DIAGNOSIS — K219 Gastro-esophageal reflux disease without esophagitis: Secondary | ICD-10-CM | POA: Diagnosis not present

## 2018-04-19 DIAGNOSIS — E118 Type 2 diabetes mellitus with unspecified complications: Secondary | ICD-10-CM | POA: Diagnosis not present

## 2018-04-29 ENCOUNTER — Telehealth: Payer: Self-pay

## 2018-04-29 DIAGNOSIS — E118 Type 2 diabetes mellitus with unspecified complications: Secondary | ICD-10-CM | POA: Diagnosis not present

## 2018-04-29 DIAGNOSIS — E1140 Type 2 diabetes mellitus with diabetic neuropathy, unspecified: Secondary | ICD-10-CM | POA: Diagnosis not present

## 2018-04-29 DIAGNOSIS — M25571 Pain in right ankle and joints of right foot: Secondary | ICD-10-CM | POA: Diagnosis not present

## 2018-04-29 DIAGNOSIS — I5042 Chronic combined systolic (congestive) and diastolic (congestive) heart failure: Principal | ICD-10-CM

## 2018-04-29 DIAGNOSIS — G4700 Insomnia, unspecified: Secondary | ICD-10-CM | POA: Diagnosis not present

## 2018-04-29 DIAGNOSIS — I38 Endocarditis, valve unspecified: Secondary | ICD-10-CM

## 2018-04-29 DIAGNOSIS — D509 Iron deficiency anemia, unspecified: Secondary | ICD-10-CM | POA: Diagnosis not present

## 2018-04-29 MED ORDER — TORSEMIDE 20 MG PO TABS: 60.0000 mg | ORAL_TABLET | Freq: Two times a day (BID) | ORAL | 0 refills | Status: DC

## 2018-04-29 MED ORDER — CLONIDINE HCL 0.1 MG PO TABS: ORAL_TABLET | ORAL | 0 refills | Status: DC

## 2018-04-29 MED ORDER — AMIODARONE HCL 200 MG PO TABS: 200.0000 mg | ORAL_TABLET | Freq: Every day | ORAL | 0 refills | Status: DC

## 2018-04-29 NOTE — Telephone Encounter (Signed)
Rx sent to pharmacy as requested.

## 2018-05-01 ENCOUNTER — Telehealth: Payer: Self-pay

## 2018-05-01 DIAGNOSIS — D631 Anemia in chronic kidney disease: Secondary | ICD-10-CM | POA: Diagnosis not present

## 2018-05-01 DIAGNOSIS — I5042 Chronic combined systolic (congestive) and diastolic (congestive) heart failure: Principal | ICD-10-CM

## 2018-05-01 DIAGNOSIS — I38 Endocarditis, valve unspecified: Secondary | ICD-10-CM

## 2018-05-01 DIAGNOSIS — N184 Chronic kidney disease, stage 4 (severe): Secondary | ICD-10-CM | POA: Diagnosis not present

## 2018-05-01 MED ORDER — CLONIDINE HCL 0.1 MG PO TABS: ORAL_TABLET | ORAL | 0 refills | Status: DC

## 2018-05-01 MED ORDER — AMIODARONE HCL 200 MG PO TABS: 200.0000 mg | ORAL_TABLET | Freq: Every day | ORAL | 0 refills | Status: DC

## 2018-05-01 MED ORDER — TORSEMIDE 20 MG PO TABS: 60.0000 mg | ORAL_TABLET | Freq: Two times a day (BID) | ORAL | 0 refills | Status: DC

## 2018-05-01 MED ORDER — HYDRALAZINE HCL 50 MG PO TABS: 50.0000 mg | ORAL_TABLET | Freq: Three times a day (TID) | ORAL | 0 refills | Status: DC

## 2018-05-01 NOTE — Telephone Encounter (Signed)
90 day refills of amiodarone, torsemide, clonidine, and hydralazine refills sent to [facility] as requested via fax from pharmacy-no refills.

## 2018-05-04 DIAGNOSIS — N184 Chronic kidney disease, stage 4 (severe): Secondary | ICD-10-CM | POA: Diagnosis not present

## 2018-05-04 DIAGNOSIS — S92411A Displaced fracture of proximal phalanx of right great toe, initial encounter for closed fracture: Secondary | ICD-10-CM | POA: Diagnosis not present

## 2018-05-04 DIAGNOSIS — Z7901 Long term (current) use of anticoagulants: Secondary | ICD-10-CM | POA: Diagnosis not present

## 2018-05-04 DIAGNOSIS — Z7902 Long term (current) use of antithrombotics/antiplatelets: Secondary | ICD-10-CM | POA: Diagnosis not present

## 2018-05-04 DIAGNOSIS — E1122 Type 2 diabetes mellitus with diabetic chronic kidney disease: Secondary | ICD-10-CM | POA: Diagnosis not present

## 2018-05-04 DIAGNOSIS — Z8673 Personal history of transient ischemic attack (TIA), and cerebral infarction without residual deficits: Secondary | ICD-10-CM | POA: Diagnosis not present

## 2018-05-04 DIAGNOSIS — Z87891 Personal history of nicotine dependence: Secondary | ICD-10-CM | POA: Diagnosis not present

## 2018-05-04 DIAGNOSIS — Z794 Long term (current) use of insulin: Secondary | ICD-10-CM | POA: Diagnosis not present

## 2018-05-04 DIAGNOSIS — I110 Hypertensive heart disease with heart failure: Secondary | ICD-10-CM | POA: Diagnosis not present

## 2018-05-04 DIAGNOSIS — M1039 Gout due to renal impairment, multiple sites: Secondary | ICD-10-CM | POA: Diagnosis not present

## 2018-05-04 DIAGNOSIS — Z79899 Other long term (current) drug therapy: Secondary | ICD-10-CM | POA: Diagnosis not present

## 2018-05-04 DIAGNOSIS — Z7982 Long term (current) use of aspirin: Secondary | ICD-10-CM | POA: Diagnosis not present

## 2018-05-04 DIAGNOSIS — I252 Old myocardial infarction: Secondary | ICD-10-CM | POA: Diagnosis not present

## 2018-05-04 DIAGNOSIS — I2510 Atherosclerotic heart disease of native coronary artery without angina pectoris: Secondary | ICD-10-CM | POA: Diagnosis not present

## 2018-05-04 DIAGNOSIS — I5043 Acute on chronic combined systolic (congestive) and diastolic (congestive) heart failure: Secondary | ICD-10-CM | POA: Diagnosis not present

## 2018-05-04 DIAGNOSIS — N179 Acute kidney failure, unspecified: Secondary | ICD-10-CM | POA: Diagnosis not present

## 2018-05-04 DIAGNOSIS — I130 Hypertensive heart and chronic kidney disease with heart failure and stage 1 through stage 4 chronic kidney disease, or unspecified chronic kidney disease: Secondary | ICD-10-CM | POA: Diagnosis not present

## 2018-05-07 NOTE — Progress Notes (Signed)
Cardiology Office Note:    Date:  05/08/2018   ID:  James Chan, DOB [birth year], MRN [id number]  PCP:  Cher Nakai, MD  Cardiologist:  Shirlee More, MD    Referring MD: Cher Nakai, MD    ASSESSMENT:    1. Chronic combined systolic and diastolic heart failure ([place])   2. Coronary artery disease involving native coronary artery of native heart without angina pectoris   3. PAF (paroxysmal atrial fibrillation) ([place])   4. On amiodarone therapy   5. Chronic anticoagulation   6. Hypertensive heart and chronic kidney disease with heart failure and stage 1 through stage 4 chronic kidney disease, or chronic kidney disease ([place])   7. Dyslipidemia   8. Bradycardia   9. Iron deficiency anemia, unspecified iron deficiency anemia type   10. PAD (peripheral artery disease) (HCC)    PLAN:    In order of problems listed above:  1. His predominant problem now is right heart failure in the setting of stage V CKD and likely the initiation of renal replacement therapy I placed a call to his nephrologist. 2. Stable CAD medical therapy at this time I would not pursue an ischemia evaluation 3. Stable continue low-dose amiodarone 4. Continue amiodarone 5. He is on reduced dose anticoagulation with CKD 6. Worsening kidney disease stage V decompensated heart failure 7. Continue his statin I do not have a recent lipid profile 8. Stable not on a beta-blocker with significant bradycardia in the past 9. Worsened anemia 10. Unfortunately at this time other pressing issues his peripheral arterial disease is not severe and I would not pursue further evaluation or intervention until his decompensated heart failure and worsens CKD are resolved   Next appointment: As needed after nephrology evaluation   Medication Adjustments/Labs and Tests Ordered: Current medicines are reviewed at length with the patient today.  Concerns regarding medicines are outlined above.  No orders of the defined types were placed  in this encounter.  No orders of the defined types were placed in this encounter.   Chief Complaint  Patient presents with  . Follow-up    after ABI  . PAD  . Congestive Heart Failure  . Coronary Artery Disease  . Atrial Fibrillation    on amiodarone  . Bradycardia    History of Present Illness:    James Chan is a 68 y.o. male with a hx of NSTEMI 04/19/16 with PCI of ISR of RCA with 2 DES, CAD EF 50-55% 11/15/17 with multiple PCI of RCA for restenosis, brief  Paroxysmal Atrial Fibrillation on amiodarone,  diastolic CHF , Dyslipidemia, HTN, stage 3 CKD, Peripheral Vascular Disease,  Stage 4 CKD and anemia  He was last seen 02/05/18 with uncontrolled HTN and limb pain. He had lower extremity ABI done:  Summary:  03/27/18  all waveforms were biphasic Right: Resting right ankle-brachial index 0.82 indicates mild right lower extremity arterial disease. The right toe-brachial index is normal. Left:    Resting left ankle-brachial index 0.74 indicates moderate left lower extremity arterial disease. The left toe-brachial index is normal.  Compliance with diet, lifestyle and medications: Yes  He is not doing well he has had gout he has had visits and emergency room primary care physician steroid injections as well as oral steroids.  His kidney function is worsened his most recent creatinine went from 3.1-4.2 GFR from 20 to 14 cc.  His weight is up he is edematous and he has marked orthopnea and episodes of PND his hemoglobin  is now 8.2.  He is advised to follow-up with me when they give him extra diuretics in the emergency room but I think the problem here is volume overload with severe kidney disease I think he is at the cusp of needing renal replacement therapy and ultrafiltration.  He is not having chest pain palpitation or syncope and he is significantly short of breath at rest in my office today with a third heart sound no peripheral edema I will place a call to his nephrologist he is  due to be seen in 48 hours and see if they can accelerate this and perhaps would require inpatient care and/or initiation of renal replacement therapy he does not have a fistula. Past Medical History:  Diagnosis Date  . Anemia   . Anxiety state, unspecified 09/15/2008   Qualifier: Diagnosis of  By: Bullins CMA, Ami    . Arterial insufficiency of lower extremity ([place]) 05/10/2016  . Arthritis   . BACK PAIN, LUMBAR, CHRONIC 09/15/2008   Qualifier: Diagnosis of  By: Bullins CMA, Ami    . CHF (congestive heart failure) ([place])   . Chronic kidney disease   . Claudication ([place]) 10/30/2014  . Comprehensive diabetic foot examination, type 2 DM, encounter for [facility]) 09/15/2008   Qualifier: Diagnosis of  By: Bullins CMA, Ami    . DEPRESSIVE DISORDER 09/15/2008   Qualifier: Diagnosis of  By: Bullins CMA, Ami    . Diabetes mellitus without complication ([place])   . Diabetic polyneuropathy associated with diabetes mellitus due to underlying condition ([place]) 06/28/2015  . Dyslipidemia 11/24/2015  . Esophageal reflux 09/15/2008   Qualifier: Diagnosis of  By: Bullins CMA, Ami    . Glaucoma   . Gout   . Hammer toes of both feet 06/28/2015  . History of carotid artery stenosis 10/30/2014  . 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 Hyperlipidemia   . Hypertension   . Hypertensive heart failure ([place]) 11/28/2016  . HYPERTRIGLYCERIDEMIA 09/15/2008   Qualifier: Diagnosis of  By: Bullins CMA, Ami    . Myocardial infarction ([place])   . NSTEMI (non-ST elevated myocardial infarction) ([place]) 09/15/2008   Qualifier: Diagnosis of  By: Bullins CMA, Ami    . Onychomycosis due to dermatophyte 06/28/2015  . PAD (peripheral artery disease) ([place]) 10/30/2014  . Stroke [facility])     Past Surgical History:  Procedure Laterality Date  . angioplasty of iliofemoral and iliac artery with stent placement      Current Medications: Current Meds  Medication Sig  . amiodarone (PACERONE) 200 MG tablet Take 1 tablet (200 mg total) by  mouth daily.  Marland Kitchen atorvastatin (LIPITOR) 40 MG tablet Take 40 mg by mouth daily.  . cloNIDine (CATAPRES) 0.1 MG tablet Take 1 tablet (0.1 mg) in the morning if SBP (top BP number) > 160 and take 1 tablet (0.1 mg) at bedtime.  . clopidogrel (PLAVIX) 75 MG tablet Take 75 mg by mouth daily.  . hydrALAZINE (APRESOLINE) 50 MG tablet Take 1 tablet (50 mg total) by mouth 3 (three) times daily.  . insulin detemir (LEVEMIR) 100 UNIT/ML injection Inject 30 Units into the skin 2 (two) times daily.   Marland Kitchen latanoprost (XALATAN) 0.005 % ophthalmic solution Place 1 drop into both eyes at bedtime.  . nitroGLYCERIN (NITROSTAT) 0.4 MG SL tablet Place 1 tablet (0.4 mg total) under the tongue every 5 (five) minutes as needed for chest pain.  Marland Kitchen oxyCODONE-acetaminophen (PERCOCET/ROXICET) 5-325 MG tablet Take 1 tablet by mouth every 4 (four) hours as  needed for moderate pain or severe pain.   Marland Kitchen PROAIR HFA 108 (90 Base) MCG/ACT inhaler Inhale 2 puffs into the lungs every 6 (six) hours as needed.  . sildenafil (VIAGRA) 25 MG tablet Take 1 tablet (25 mg total) daily as needed by mouth for erectile dysfunction.  . torsemide (DEMADEX) 20 MG tablet Take 3 tablets (60 mg total) by mouth 2 (two) times daily. Take 60 mg twice daily. If you weigh >191 lbs, take 3 am & 2 pm if you weigh <191     Allergies:   Patient has no known allergies.   Social History   Socioeconomic History  . Marital status: Married    Spouse name: Not on file  . Number of children: Not on file  . Years of education: Not on file  . Highest education level: Not on file  Occupational History  . Not on file  Social Needs  . Financial resource strain: Not on file  . Food insecurity:    Worry: Not on file    Inability: Not on file  . Transportation needs:    Medical: Not on file    Non-medical: Not on file  Tobacco Use  . Smoking status: Former Smoker    Last attempt to quit: 05/09/1994    Years since quitting: 24.0  . Smokeless tobacco: Never Used    Substance and Sexual Activity  . Alcohol use: No  . Drug use: No  . Sexual activity: Not on file  Lifestyle  . Physical activity:    Days per week: Not on file    Minutes per session: Not on file  . Stress: Not on file  Relationships  . Social connections:    Talks on phone: Not on file    Gets together: Not on file    Attends religious service: Not on file    Active member of club or organization: Not on file    Attends meetings of clubs or organizations: Not on file    Relationship status: Not on file  Other Topics Concern  . Not on file  Social History Narrative  . Not on file     Family History: The patient's family history includes Cancer in his father and mother; Heart disease in his father and mother. ROS:   Please see the history of present illness.    All other systems reviewed and are negative.  EKGs/Labs/Other Studies Reviewed:    The following studies were reviewed today: [facility] ED records reviewed during the patient visit  Recent Labs: 07/02/2017: ALT 12; BUN 62; Creatinine, Ser 2.74; Hemoglobin 9.5; Platelets 343; Potassium 4.8; Sodium 142  Recent Lipid Panel    Component Value Date/Time   CHOL 147 07/02/2017 0939   TRIG 143 07/02/2017 0939   HDL 32 (L) 07/02/2017 0939   CHOLHDL 4.6 07/02/2017 0939   LDLCALC 86 07/02/2017 0939    Physical Exam:    VS:  BP (!) 144/60 (BP Location: Right Arm, Patient Position: Sitting, Cuff Size: Normal)   Pulse 68   Ht 6' (1.829 m)   Wt 199 lb 8 oz (90.5 kg)   SpO2 98%   BMI 27.06 kg/m     Wt Readings from Last 3 Encounters:  05/08/18 199 lb 8 oz (90.5 kg)  02/05/18 199 lb 12.8 oz (90.6 kg)  10/29/17 197 lb (89.4 kg)     GEN: He is quite breathless at rest anxious depressed mood and flat affect  HEENT: Normal NECK: Mild JVD; No  carotid bruits LYMPHATICS: No lymphadenopathy CARDIAC: Soft S1 S3 present RRR, no murmurs, rubs, gallops RESPIRATORY:  Clear to auscultation without rales, wheezing or  rhonchi  ABDOMEN: Soft, non-tender, non-distended MUSCULOSKELETAL: 3+ lower extremity bilateral the left edema; No deformity  SKIN: Warm and dry NEUROLOGIC:  Alert and oriented x 3 PSYCHIATRIC:  Normal affect    Signed, Shirlee More, MD  05/08/2018 9:03 AM    [place]

## 2018-05-08 ENCOUNTER — Emergency Department (HOSPITAL_COMMUNITY): Payer: Medicare Other

## 2018-05-08 ENCOUNTER — Encounter (HOSPITAL_COMMUNITY): Payer: Self-pay | Admitting: Emergency Medicine

## 2018-05-08 ENCOUNTER — Encounter: Payer: Self-pay | Admitting: Cardiology

## 2018-05-08 ENCOUNTER — Emergency Department (HOSPITAL_COMMUNITY)
Admission: EM | Admit: 2018-05-08 | Discharge: 2018-05-09 | Disposition: A | Discharge status: Home / Self Care | Payer: Medicare Other | Attending: Emergency Medicine | Admitting: Emergency Medicine

## 2018-05-08 ENCOUNTER — Other Ambulatory Visit: Payer: Self-pay

## 2018-05-08 ENCOUNTER — Ambulatory Visit (INDEPENDENT_AMBULATORY_CARE_PROVIDER_SITE_OTHER): Payer: Medicare Other | Admitting: Cardiology

## 2018-05-08 ENCOUNTER — Telehealth: Payer: Self-pay | Admitting: Cardiology

## 2018-05-08 VITALS — BP 144/60 | HR 68 | Ht 72.0 in | Wt 199.5 lb

## 2018-05-08 DIAGNOSIS — R001 Bradycardia, unspecified: Secondary | ICD-10-CM

## 2018-05-08 DIAGNOSIS — Z79899 Other long term (current) drug therapy: Secondary | ICD-10-CM | POA: Insufficient documentation

## 2018-05-08 DIAGNOSIS — Z7901 Long term (current) use of anticoagulants: Secondary | ICD-10-CM | POA: Diagnosis not present

## 2018-05-08 DIAGNOSIS — M5489 Other dorsalgia: Secondary | ICD-10-CM | POA: Diagnosis not present

## 2018-05-08 DIAGNOSIS — I480 Paroxysmal atrial fibrillation: Secondary | ICD-10-CM

## 2018-05-08 DIAGNOSIS — R109 Unspecified abdominal pain: Secondary | ICD-10-CM | POA: Diagnosis not present

## 2018-05-08 DIAGNOSIS — Z7902 Long term (current) use of antithrombotics/antiplatelets: Secondary | ICD-10-CM | POA: Diagnosis not present

## 2018-05-08 DIAGNOSIS — R0602 Shortness of breath: Secondary | ICD-10-CM | POA: Diagnosis not present

## 2018-05-08 DIAGNOSIS — I2510 Atherosclerotic heart disease of native coronary artery without angina pectoris: Secondary | ICD-10-CM | POA: Diagnosis not present

## 2018-05-08 DIAGNOSIS — R7989 Other specified abnormal findings of blood chemistry: Secondary | ICD-10-CM

## 2018-05-08 DIAGNOSIS — N189 Chronic kidney disease, unspecified: Secondary | ICD-10-CM | POA: Diagnosis not present

## 2018-05-08 DIAGNOSIS — I5042 Chronic combined systolic (congestive) and diastolic (congestive) heart failure: Secondary | ICD-10-CM | POA: Diagnosis not present

## 2018-05-08 DIAGNOSIS — M25572 Pain in left ankle and joints of left foot: Secondary | ICD-10-CM

## 2018-05-08 DIAGNOSIS — I129 Hypertensive chronic kidney disease with stage 1 through stage 4 chronic kidney disease, or unspecified chronic kidney disease: Secondary | ICD-10-CM | POA: Diagnosis not present

## 2018-05-08 DIAGNOSIS — Z794 Long term (current) use of insulin: Secondary | ICD-10-CM | POA: Diagnosis not present

## 2018-05-08 DIAGNOSIS — M545 Low back pain: Secondary | ICD-10-CM | POA: Diagnosis not present

## 2018-05-08 DIAGNOSIS — E785 Hyperlipidemia, unspecified: Secondary | ICD-10-CM

## 2018-05-08 DIAGNOSIS — I110 Hypertensive heart disease with heart failure: Secondary | ICD-10-CM | POA: Diagnosis not present

## 2018-05-08 DIAGNOSIS — E119 Type 2 diabetes mellitus without complications: Secondary | ICD-10-CM | POA: Diagnosis not present

## 2018-05-08 DIAGNOSIS — F17210 Nicotine dependence, cigarettes, uncomplicated: Secondary | ICD-10-CM | POA: Insufficient documentation

## 2018-05-08 DIAGNOSIS — R778 Other specified abnormalities of plasma proteins: Secondary | ICD-10-CM

## 2018-05-08 DIAGNOSIS — I130 Hypertensive heart and chronic kidney disease with heart failure and stage 1 through stage 4 chronic kidney disease, or unspecified chronic kidney disease: Secondary | ICD-10-CM

## 2018-05-08 DIAGNOSIS — M109 Gout, unspecified: Secondary | ICD-10-CM

## 2018-05-08 DIAGNOSIS — I739 Peripheral vascular disease, unspecified: Secondary | ICD-10-CM

## 2018-05-08 DIAGNOSIS — D509 Iron deficiency anemia, unspecified: Secondary | ICD-10-CM

## 2018-05-08 DIAGNOSIS — I509 Heart failure, unspecified: Secondary | ICD-10-CM

## 2018-05-08 DIAGNOSIS — R300 Dysuria: Secondary | ICD-10-CM | POA: Diagnosis not present

## 2018-05-08 DIAGNOSIS — M791 Myalgia, unspecified site: Secondary | ICD-10-CM | POA: Diagnosis present

## 2018-05-08 LAB — URINALYSIS, ROUTINE W REFLEX MICROSCOPIC
BILIRUBIN URINE: NEGATIVE
Bacteria, UA: NONE SEEN
Glucose, UA: NEGATIVE mg/dL
HGB URINE DIPSTICK: NEGATIVE
Ketones, ur: NEGATIVE mg/dL
Leukocytes, UA: NEGATIVE
Nitrite: NEGATIVE
Protein, ur: 30 mg/dL — AB
Specific Gravity, Urine: 1.009 (ref 1.005–1.030)
pH: 5 (ref 5.0–8.0)

## 2018-05-08 MED ORDER — METOLAZONE 5 MG PO TABS: 5.0000 mg | ORAL_TABLET | Freq: Every day | ORAL | 3 refills | Status: DC

## 2018-05-08 NOTE — ED Provider Notes (Signed)
[facility] EMERGENCY DEPARTMENT Provider Note   CSN: [id number] Arrival date & time: 05/08/18  1735     History   Chief Complaint Chief Complaint  Patient presents with  . pain all over  . Groin Pain  . Back Pain    HPI James Chan is a 68 y.o. male.  HPI   Patient is here for evaluation of generalized pain, concern for gout, shortness of breath, concern for worsening kidney failure.  Today he saw his cardiologist to follow-up on these same problems, and the cardiologist ordered Zaroxolyn which the patient started about noon today.  The cardiologist was planning on managing him in the outpatient setting, but when the patient felt worse, he called the cardiologist who advised coming to emergency department.  The patient lives about an hour from here, and was brought here by EMS because his renal doctor practices at this facility.  He last saw his nephrologist about 3 weeks ago.  He was seen in ED about a week ago and diagnosed with gout, worsening heart failure and was instructed to seek care by a comprehensive care team.  Patient denies fever, cough, nausea, vomiting.  He continues to urinate well but tends to have frequent lower volume urination episodes.  He is taking his medicines as prescribed.  There are no other known modifying factors.    Past Medical History:  Diagnosis Date  . Anemia   . Anxiety state, unspecified 09/15/2008   Qualifier: Diagnosis of  By: Bullins CMA, Ami    . Arterial insufficiency of lower extremity ([place]) 05/10/2016  . Arthritis   . BACK PAIN, LUMBAR, CHRONIC 09/15/2008   Qualifier: Diagnosis of  By: Bullins CMA, Ami    . CHF (congestive heart failure) ([place])   . Chronic kidney disease   . Claudication ([place]) 10/30/2014  . Comprehensive diabetic foot examination, type 2 DM, encounter for [facility]) 09/15/2008   Qualifier: Diagnosis of  By: Bullins CMA, Ami    . DEPRESSIVE DISORDER 09/15/2008   Qualifier: Diagnosis of  By: Bullins CMA,  Ami    . Diabetes mellitus without complication ([place])   . Diabetic polyneuropathy associated with diabetes mellitus due to underlying condition ([place]) 06/28/2015  . Dyslipidemia 11/24/2015  . Esophageal reflux 09/15/2008   Qualifier: Diagnosis of  By: Bullins CMA, Ami    . Glaucoma   . Gout   . Hammer toes of both feet 06/28/2015  . History of carotid artery stenosis 10/30/2014  . 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 Hyperlipidemia   . Hypertension   . Hypertensive heart failure ([place]) 11/28/2016  . HYPERTRIGLYCERIDEMIA 09/15/2008   Qualifier: Diagnosis of  By: Bullins CMA, Ami    . Myocardial infarction ([place])   . NSTEMI (non-ST elevated myocardial infarction) ([place]) 09/15/2008   Qualifier: Diagnosis of  By: Bullins CMA, Ami    . Onychomycosis due to dermatophyte 06/28/2015  . PAD (peripheral artery disease) ([place]) 10/30/2014  . Stroke [facility])     Patient Active Problem List   Diagnosis Date Noted  . Mitral regurgitation 02/05/2018  . Bradycardia 07/02/2017  . Iron deficiency anemia 07/02/2017  . CKD (chronic kidney disease) 04/11/2017  . PAF (paroxysmal atrial fibrillation) ([place]) 02/27/2017  . Chronic combined systolic and diastolic heart failure ([place]) 11/28/2016  . Hypertensive heart and chronic kidney disease with heart failure and stage 1 through stage 4 chronic kidney disease, or chronic kidney disease ([place]) 11/28/2016  . On amiodarone therapy 11/28/2016  . Chronic  anticoagulation 11/28/2016  . Erectile dysfunction 11/28/2016  . Arterial insufficiency of lower extremity ([place]) 05/10/2016  . Coronary artery disease involving native coronary artery of native heart without angina pectoris 11/24/2015  . Dyslipidemia 11/24/2015  . Diabetic polyneuropathy associated with diabetes mellitus due to underlying condition ([place]) 06/28/2015  . Hammer toes of both feet 06/28/2015  . Onychomycosis due to dermatophyte 06/28/2015  . Type 2 diabetes, controlled, with peripheral  circulatory disorder ([place]) 06/28/2015  . Claudication ([place]) 10/30/2014  . History of carotid artery stenosis 10/30/2014  . 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 PAD (peripheral artery disease) ([place]) 10/30/2014  . Comprehensive diabetic foot examination, type 2 DM, encounter for ([place]) 09/15/2008  . HYPERTRIGLYCERIDEMIA 09/15/2008  . ANXIETY STATE, UNSPECIFIED 09/15/2008  . DEPRESSIVE DISORDER 09/15/2008  . NSTEMI (non-ST elevated myocardial infarction) ([place]) 09/15/2008  . ESOPHAGEAL REFLUX 09/15/2008  . BACK PAIN, LUMBAR, CHRONIC 09/15/2008    Past Surgical History:  Procedure Laterality Date  . angioplasty of iliofemoral and iliac artery with stent placement          Home Medications    Prior to Admission medications   Medication Sig Start Date End Date Taking? Authorizing Provider  amiodarone (PACERONE) 200 MG tablet Take 1 tablet (200 mg total) by mouth daily. 05/01/18   Richardo Priest, MD  atorvastatin (LIPITOR) 40 MG tablet Take 40 mg by mouth daily.    [provider]  cloNIDine (CATAPRES) 0.1 MG tablet Take 1 tablet (0.1 mg) in the morning if SBP (top BP number) > 160 and take 1 tablet (0.1 mg) at bedtime. 05/01/18   Richardo Priest, MD  clopidogrel (PLAVIX) 75 MG tablet Take 75 mg by mouth daily.    [provider]  hydrALAZINE (APRESOLINE) 50 MG tablet Take 1 tablet (50 mg total) by mouth 3 (three) times daily. 05/01/18   Richardo Priest, MD  insulin detemir (LEVEMIR) 100 UNIT/ML injection Inject 30 Units into the skin 2 (two) times daily.     [provider]  latanoprost (XALATAN) 0.005 % ophthalmic solution Place 1 drop into both eyes at bedtime. 06/16/14   [provider]  metolazone (ZAROXOLYN) 5 MG tablet Take 1 tablet (5 mg total) by mouth daily. 05/08/18 08/06/18  Richardo Priest, MD  nitroGLYCERIN (NITROSTAT) 0.4 MG SL tablet Place 1 tablet (0.4 mg total) under the tongue every 5 (five) minutes as needed for  chest pain. 06/08/17   Richardo Priest, MD  oxyCODONE-acetaminophen (PERCOCET/ROXICET) 5-325 MG tablet Take 1 tablet by mouth every 4 (four) hours as needed for moderate pain or severe pain.     [provider]  PROAIR HFA 108 [phone] Base) MCG/ACT inhaler Inhale 2 puffs into the lungs every 6 (six) hours as needed. 10/01/17   [provider]  sildenafil (VIAGRA) 25 MG tablet Take 1 tablet (25 mg total) daily as needed by mouth for erectile dysfunction. 02/28/17   Richardo Priest, MD  torsemide (DEMADEX) 20 MG tablet Take 3 tablets (60 mg total) by mouth 2 (two) times daily. Take 60 mg twice daily. If you weigh >191 lbs, take 3 am & 2 pm if you weigh <191 05/01/18   Richardo Priest, MD    Family History Family History  Problem Relation Age of Onset  . Heart disease Mother   . Cancer Mother   . Heart disease Father   . Cancer Father     Social History Social History   Tobacco Use  .  Smoking status: Former Smoker    Last attempt to quit: 05/09/1994    Years since quitting: 24.0  . Smokeless tobacco: Never Used  Substance Use Topics  . Alcohol use: No  . Drug use: No     Allergies   Patient has no known allergies.   Review of Systems Review of Systems  All other systems reviewed and are negative.    Physical Exam Updated Vital Signs BP 139/77   Pulse 82   Temp 98.3 F (36.8 C) (Oral)   Resp 19   SpO2 98%   Physical Exam Vitals signs and nursing note reviewed.  Constitutional:      General: He is not in acute distress.    Appearance: He is well-developed. He is obese. He is not ill-appearing, toxic-appearing or diaphoretic.  HENT:     Head: Normocephalic and atraumatic.     Right Ear: External ear normal.     Left Ear: External ear normal.     Nose: Nose normal.     Mouth/Throat:     Mouth: Mucous membranes are moist.     Pharynx: No oropharyngeal exudate or posterior oropharyngeal erythema.  Eyes:     Conjunctiva/sclera: Conjunctivae normal.      Pupils: Pupils are equal, round, and reactive to light.  Neck:     Musculoskeletal: Normal range of motion and neck supple.     Trachea: Phonation normal.  Cardiovascular:     Rate and Rhythm: Normal rate and regular rhythm.     Heart sounds: Normal heart sounds. No murmur.  Pulmonary:     Effort: Pulmonary effort is normal. No respiratory distress.     Breath sounds: Normal breath sounds. No stridor.  Abdominal:     Palpations: Abdomen is soft.     Tenderness: There is no abdominal tenderness.  Musculoskeletal: Normal range of motion.     Right lower leg: Edema (2+) present.     Left lower leg: Edema (2+) present.     Comments: No large joint inflammatory processes, concerning for gout.  Skin:    General: Skin is warm and dry.     Coloration: Skin is not pale.     Findings: No erythema or rash.  Neurological:     Mental Status: He is alert and oriented to person, place, and time.     Cranial Nerves: No cranial nerve deficit.     Sensory: No sensory deficit.     Motor: No abnormal muscle tone.     Coordination: Coordination normal.  Psychiatric:        Mood and Affect: Mood normal.        Behavior: Behavior normal.        Thought Content: Thought content normal.        Judgment: Judgment normal.      ED Treatments / Results  Labs (all labs ordered are listed, but only abnormal results are displayed) Labs Reviewed  URINALYSIS, ROUTINE W REFLEX MICROSCOPIC - Abnormal; Notable for the following components:      Result Value   Color, Urine STRAW (*)    Protein, ur 30 (*)    All other components within normal limits  COMPREHENSIVE METABOLIC PANEL  CBC WITH DIFFERENTIAL/PLATELET  SEDIMENTATION RATE  URIC ACID  TROPONIN I    EKG EKG Interpretation  Date/Time:  Wednesday May 08 2018 22:29:44 EST Ventricular Rate:  86 PR Interval:    QRS Duration: 137 QT Interval:  424 QTC Calculation: 508 R Axis:  84 Text Interpretation:  Sinus rhythm Prolonged PR interval  Prominent P waves, nondiagnostic LVH with secondary repolarization abnormality Prolonged QT interval Baseline wander in lead(s) V2 Since last tracing , LVH new, PR longer Confirmed by Daleen Bo [phone]) on 05/08/2018 11:21:35 PM   Radiology Dg Chest 2 View  Result Date: 05/08/2018 CLINICAL DATA:  68 year old male with shortness of breath, generalized pain. EXAM: CHEST - 2 VIEW COMPARISON:  02/05/2018 and earlier. FINDINGS: Right coronary artery stent(s) redemonstrated. Cardiac size remains at the upper limits of normal. Other mediastinal contours are within normal limits. Visualized tracheal air column is within normal limits. No pneumothorax, pulmonary edema, pleural effusion or confluent pulmonary opacity. Osteopenia. No acute osseous abnormality identified. Negative visible bowel gas pattern. IMPRESSION: No acute cardiopulmonary abnormality. Electronically Signed   By: Genevie Ann M.D.   On: 05/08/2018 23:45    Procedures Procedures (including critical care time)  Medications Ordered in ED Medications - No data to display   Initial Impression / Assessment and Plan / ED Course  I have reviewed the triage vital signs and the nursing notes.  Pertinent labs & imaging results that were available during my care of the patient were reviewed by me and considered in my medical decision making (see chart for details).      Patient Vitals for the past 24 hrs:  BP Temp Temp src Pulse Resp SpO2  05/08/18 2300 139/77 - - 82 19 98 %  05/08/18 2245 (!) 153/77 - - 78 14 99 %  05/08/18 2230 (!) 162/76 - - 86 18 99 %  05/08/18 2215 (!) 151/67 - - 80 17 99 %  05/08/18 2200 (!) 166/71 - - 81 20 100 %  05/08/18 2145 (!) 149/77 - - 80 11 98 %  05/08/18 2130 129/75 - - 80 16 100 %  05/08/18 1830 (!) 147/57 98.3 F (36.8 C) Oral 65 18 100 %    2:02 AM-care to oncoming provider team to evaluate after return of labs and consider disposition at that time.  Medical Decision Making: Nonspecific pain, with  recent diagnosis of gout.  Patient with pre-existing chronic kidney disease, and heart failure.  He does not appear to be having acute coronary syndrome.  Screening labs ordered to follow-up on prior abnormal laboratory findings.  Patient was seen by his cardiologist today and given Zaroxolyn for additional diuresis.  Screening labs and testing ordered in the ED.  CRITICAL CARE-no Performed by: Daleen Bo   Nursing Notes Reviewed/ Care Coordinated Applicable Imaging Reviewed Interpretation of Laboratory Data incorporated into ED treatment   Plan-consider disposition after labs return    Final Clinical Impressions(s) / ED Diagnoses   Final diagnoses:  Congestive heart failure, unspecified HF chronicity, unspecified heart failure type ([place])  Chronic kidney disease, unspecified CKD stage    ED Discharge Orders    None       Daleen Bo, MD 05/09/18 0004

## 2018-05-08 NOTE — Telephone Encounter (Signed)
Patient's wife Bethena Roys returned call reporting that patient is "feeling much worse."  She asked that I speak with patient to find out what he should do.   Patient reports that he was feeling worse than he did while in the office today, and he had been instructed Dr Bishop [place] office to report to [facility] ED tonight if symptoms worsened.   I advised patient to proceed to [facility] ED to be evaluated.  Patient agreed to plan and verbalized understanding.

## 2018-05-08 NOTE — Patient Instructions (Signed)
Medication Instructions:  Your physician has recommended you make the following change in your medication:   START metolazone (Zaroxolyn) 5 mg: Take 1 tablet daily   If you need a refill on your cardiac medications before your next appointment, please call your pharmacy.   Lab work: None  If you have labs (blood work) drawn today and your tests are completely normal, you will receive your results only by: Marland Kitchen MyChart Message (if you have MyChart) OR . A paper copy in the mail If you have any lab test that is abnormal or we need to change your treatment, we will call you to review the results.  Testing/Procedures: None  Follow-Up: At [facility], you and your health needs are our priority.  As part of our continuing mission to provide you with exceptional heart care, we have created designated Provider Care Teams.  These Care Teams include your primary Cardiologist (physician) and Advanced Practice Providers (APPs -  Physician Assistants and Nurse Practitioners) who all work together to provide you with the care you need, when you need it. You will need a follow up appointment in 6 weeks.     Metolazone tablets What is this medicine? METOLAZONE (me TOLE a zone) is a diuretic. It increases the amount of urine passed, which causes the body to lose salt and water. This medicine is used to treat high blood pressure. It is also reduces the swelling and water retention caused by heart or kidney disease. This medicine may be used for other purposes; ask your health care provider or pharmacist if you have questions. COMMON BRAND NAME(S): Mykrox, Zaroxolyn What should I tell my health care provider before I take this medicine? They need to know if you have any of these conditions: -diabetes -gout -immune system problems, like lupus -kidney disease -liver disease -pancreatitis -small amount of urine or difficulty passing urine -an unusual or allergic reaction to metolazone, sulfa drugs,  other medicines, foods, dyes, or preservatives -pregnant or trying to get pregnant -breast-feeding How should I use this medicine? Take this medicine by mouth with a glass of water. Follow the directions on the prescription label. Remember that you will need to pass urine frequently after taking this medicine. Do not take your doses at a time of day that will cause you problems. Do not take at bedtime. Take your medicine at regular intervals. Do not take your medicine more often than directed. Do not stop taking except on your doctor's advice. Talk to your pediatrician regarding the use of this medicine in children. Special care may be needed. Overdosage: If you think you have taken too much of this medicine contact a poison control center or emergency room at once. NOTE: This medicine is only for you. Do not share this medicine with others. What if I miss a dose? If you miss a dose, take it as soon as you can. If it is almost time for your next dose, take only that dose. Do not take double or extra doses. What may interact with this medicine? -alcohol -antiinflammatory drugs for pain or swelling -barbiturates for sleep or seizure control -digoxin -dofetilide -lithium -medicines for blood sugar -medicines for high blood pressure -medicines that relax muscles for surgery -methenamine -other diuretics -some medicines for pain -steroid hormones like cortisone, hydrocortisone, and prednisone -warfarin This list may not describe all possible interactions. Give your health care provider a list of all the medicines, herbs, non-prescription drugs, or dietary supplements you use. Also tell them if you smoke,  drink alcohol, or use illegal drugs. Some items may interact with your medicine. What should I watch for while using this medicine? Visit your doctor or health care professional for regular checks on your progress. Check your blood pressure as directed. Ask your doctor or health care  professional what your blood pressure should be and when you should contact him or her. You may need to be on a special diet while taking this medicine. Ask your doctor. Check with your doctor or health care professional if you get an attack of severe diarrhea, nausea and vomiting, or if you sweat a lot. The loss of too much body fluid can make it dangerous for you to take this medicine. You may get drowsy or dizzy. Do not drive, use machinery, or do anything that needs mental alertness until you know how this medicine affects you. Do not stand or sit up quickly, especially if you are an older patient. This reduces the risk of dizzy or fainting spells. Alcohol may interfere with the effect of this medicine. Avoid alcoholic drinks. This medicine may affect your blood sugar level. If you have diabetes, check with your doctor or health care professional before changing the dose of your diabetic medicine. This medicine can make you more sensitive to the sun. Keep out of the sun. If you cannot avoid being in the sun, wear protective clothing and use sunscreen. Do not use sun lamps or tanning beds/booths. What side effects may I notice from receiving this medicine? Side effects that you should report to your doctor or health care professional as soon as possible: -allergic reactions such as skin rash or itching, hives, swelling of the lips, mouth, tongue, or throat -fast or irregular heartbeat, chest pain -feeling faint -fever, chills -gout pain -hot red lump on leg -muscle pain, cramps -nausea, vomiting -numbness or tingling in hands, feet -pain or difficulty when passing urine -redness, blistering, peeling or loosening of the skin, including inside the mouth -unusual bleeding or bruising -unusually weak or tired -yellowing of the eyes, skin Side effects that usually do not require medical attention (report to your doctor or health care professional if they continue or are bothersome): -abdominal  pain -blurred vision -constipation or diarrhea -dry mouth -headache This list may not describe all possible side effects. Call your doctor for medical advice about side effects. You may report side effects to FDA at 1-800-FDA-1088. Where should I keep my medicine? Keep out of the reach of children. Store at room temperature between 15 and 30 degrees C (59 and 86 degrees F). Protect from light. Keep container tightly closed. Throw away any unused medicine after the expiration date. NOTE: This sheet is a summary. It may not cover all possible information. If you have questions about this medicine, talk to your doctor, pharmacist, or health care provider.  2019 Elsevier/Gold Standard (2007-10-28 14:11:48)

## 2018-05-08 NOTE — Telephone Encounter (Signed)
pts wife states he doesn't remember anything about his office visit today and wants someone to go over it with her

## 2018-05-08 NOTE — ED Notes (Signed)
Patient transported to X-ray 

## 2018-05-08 NOTE — ED Notes (Signed)
Two

## 2018-05-08 NOTE — Addendum Note (Signed)
Addended by: Austin Miles on: 05/08/2018 09:59 AM   Modules accepted: Orders

## 2018-05-08 NOTE — ED Triage Notes (Signed)
Pt to triage via [facility].  Reports generalized pain all over, lower back pain, groin pain, and L foot pain (possible gout).  Reports history of chronic pain but feeling worse today.  Took Oxycodone 1 tab 1 hour pta.

## 2018-05-08 NOTE — ED Notes (Signed)
Two unsuccessful IV attempts.

## 2018-05-08 NOTE — Telephone Encounter (Signed)
Left message for patient's wife Bethena Roys to return call.

## 2018-05-09 LAB — COMPREHENSIVE METABOLIC PANEL
ALT: 16 U/L (ref 0–44)
ANION GAP: 14 (ref 5–15)
AST: 14 U/L — ABNORMAL LOW (ref 15–41)
Albumin: 3 g/dL — ABNORMAL LOW (ref 3.5–5.0)
Alkaline Phosphatase: 67 U/L (ref 38–126)
BUN: 106 mg/dL — ABNORMAL HIGH (ref 8–23)
CO2: 19 mmol/L — ABNORMAL LOW (ref 22–32)
Calcium: 8.7 mg/dL — ABNORMAL LOW (ref 8.9–10.3)
Chloride: 98 mmol/L (ref 98–111)
Creatinine, Ser: 3.98 mg/dL — ABNORMAL HIGH (ref 0.61–1.24)
GFR calc Af Amer: 17 mL/min — ABNORMAL LOW (ref >60)
GFR, EST NON AFRICAN AMERICAN: 15 mL/min — AB (ref >60)
Glucose, Bld: 184 mg/dL — ABNORMAL HIGH (ref 70–99)
Potassium: 4.8 mmol/L (ref 3.5–5.1)
Sodium: 131 mmol/L — ABNORMAL LOW (ref 135–145)
Total Bilirubin: 0.9 mg/dL (ref 0.3–1.2)
Total Protein: 7.3 g/dL (ref 6.5–8.1)

## 2018-05-09 LAB — CBC WITH DIFFERENTIAL/PLATELET
Abs Immature Granulocytes: 0.16 10*3/uL — ABNORMAL HIGH (ref 0.00–0.07)
BASOS ABS: 0 10*3/uL (ref 0.0–0.1)
Basophils Relative: 0 %
Eosinophils Absolute: 0.1 10*3/uL (ref 0.0–0.5)
Eosinophils Relative: 0 %
HCT: 28.1 % — ABNORMAL LOW (ref 39.0–52.0)
Hemoglobin: 8.8 g/dL — ABNORMAL LOW (ref 13.0–17.0)
IMMATURE GRANULOCYTES: 1 %
Lymphocytes Relative: 4 %
Lymphs Abs: 0.6 10*3/uL — ABNORMAL LOW (ref 0.7–4.0)
MCH: 27.8 pg (ref 26.0–34.0)
MCHC: 31.3 g/dL (ref 30.0–36.0)
MCV: 88.9 fL (ref 80.0–100.0)
Monocytes Absolute: 1.5 10*3/uL — ABNORMAL HIGH (ref 0.1–1.0)
Monocytes Relative: 9 %
NEUTROS PCT: 86 %
Neutro Abs: 13.8 10*3/uL — ABNORMAL HIGH (ref 1.7–7.7)
Platelets: 522 10*3/uL — ABNORMAL HIGH (ref 150–400)
RBC: 3.16 MIL/uL — AB (ref 4.22–5.81)
RDW: 13.7 % (ref 11.5–15.5)
WBC: 16.2 10*3/uL — ABNORMAL HIGH (ref 4.0–10.5)
nRBC: 0 % (ref 0.0–0.2)

## 2018-05-09 LAB — INFLUENZA PANEL BY PCR (TYPE A & B)
Influenza A By PCR: NEGATIVE
Influenza B By PCR: NEGATIVE

## 2018-05-09 LAB — CBG MONITORING, ED: Glucose-Capillary: 177 mg/dL — ABNORMAL HIGH (ref 70–99)

## 2018-05-09 LAB — URIC ACID: Uric Acid, Serum: 13.7 mg/dL — ABNORMAL HIGH (ref 3.7–8.6)

## 2018-05-09 LAB — TROPONIN I
TROPONIN I: 0.11 ng/mL — AB (ref <0.03)
Troponin I: 0.09 ng/mL (ref <0.03)

## 2018-05-09 LAB — SEDIMENTATION RATE: SED RATE: 137 mm/h — AB (ref 0–16)

## 2018-05-09 MED ORDER — OXYCODONE-ACETAMINOPHEN 5-325 MG PO TABS
1.0000 | ORAL_TABLET | Freq: Once | ORAL | Status: AC
  Administered 2018-05-09: 1 via ORAL
  Filled 2018-05-09: qty 1

## 2018-05-09 NOTE — ED Notes (Signed)
Ordered Breakfast Tray  

## 2018-05-09 NOTE — ED Notes (Signed)
Patient verbalizes understanding of discharge instructions. Opportunity for questioning and answers were provided. Armband removed by staff, pt discharged from ED ambulatory.   

## 2018-05-09 NOTE — ED Notes (Signed)
ED Provider at bedside. 

## 2018-05-09 NOTE — ED Notes (Signed)
Patient denies chest pain at this time. States "it's just a pain all over my body, like body aches".

## 2018-05-09 NOTE — ED Notes (Signed)
Patient assisted to bathroom in wheelchair

## 2018-05-09 NOTE — ED Provider Notes (Signed)
I assumed care of this patient from Dr. Eulis Foster at 0000.  Please see their note for further details of Hx, PE.  Briefly patient is a 68 y.o. male who presented with who presents with generalized pain mostly in hips and left ankle attributed to gout.  Patient also here to be evaluated for worsening renal function since his nephrologist is in the area.  Planning on screening labs.  Labs revealed leukocytosis with elevated uric acid and ESR.  He is afebrile.  Exam was not concerning for septic arthritis.  Likely related to demargination or gout flare.  Troponin was noted to be elevated.  The patient did not complain of any chest pain.  He did complain of shortness of breath which has been ongoing for 4 months and without acute change.  He attributes this to his known heart failure.  Repeat troponin was obtained several hours later and practically unchanged.  Doubt ACS.  Patient does not have evidence of CHF exacerbation at this time.   Renal function similar to last week.  No evidence of emergent need for dialysis at this time.  The patient is safe for discharge with strict return precautions.  Disposition: Discharge  Condition: Good  I have discussed the results, Dx and Tx plan with the patient who expressed understanding and agree(s) with the plan. Discharge instructions discussed at great length. The patient was given strict return precautions who verbalized understanding of the instructions. No further questions at time of discharge.    ED Discharge Orders    None       Follow Up: Cher Nakai, MD [address] [place] [address] [phone]  Schedule an appointment as soon as possible for a visit  As needed      Jaymes Revels, Grayce Sessions, MD 05/09/18 [phone]

## 2018-05-09 NOTE — ED Notes (Signed)
Patient provided with magazine while he waits in the hallway.

## 2018-05-10 DIAGNOSIS — N185 Chronic kidney disease, stage 5: Secondary | ICD-10-CM | POA: Diagnosis not present

## 2018-05-10 DIAGNOSIS — N184 Chronic kidney disease, stage 4 (severe): Secondary | ICD-10-CM | POA: Diagnosis not present

## 2018-05-10 DIAGNOSIS — I509 Heart failure, unspecified: Secondary | ICD-10-CM | POA: Diagnosis not present

## 2018-05-10 DIAGNOSIS — D631 Anemia in chronic kidney disease: Secondary | ICD-10-CM | POA: Diagnosis not present

## 2018-05-10 DIAGNOSIS — M109 Gout, unspecified: Secondary | ICD-10-CM | POA: Diagnosis not present

## 2018-05-10 DIAGNOSIS — E1122 Type 2 diabetes mellitus with diabetic chronic kidney disease: Secondary | ICD-10-CM | POA: Diagnosis not present

## 2018-05-15 DIAGNOSIS — K219 Gastro-esophageal reflux disease without esophagitis: Secondary | ICD-10-CM | POA: Insufficient documentation

## 2018-05-15 DIAGNOSIS — I120 Hypertensive chronic kidney disease with stage 5 chronic kidney disease or end stage renal disease: Secondary | ICD-10-CM

## 2018-05-15 DIAGNOSIS — I739 Peripheral vascular disease, unspecified: Secondary | ICD-10-CM

## 2018-05-15 DIAGNOSIS — Z452 Encounter for adjustment and management of vascular access device: Secondary | ICD-10-CM

## 2018-05-15 DIAGNOSIS — R319 Hematuria, unspecified: Secondary | ICD-10-CM

## 2018-05-15 DIAGNOSIS — E039 Hypothyroidism, unspecified: Secondary | ICD-10-CM

## 2018-05-15 DIAGNOSIS — N189 Chronic kidney disease, unspecified: Secondary | ICD-10-CM | POA: Insufficient documentation

## 2018-05-15 DIAGNOSIS — I4891 Unspecified atrial fibrillation: Secondary | ICD-10-CM

## 2018-05-15 DIAGNOSIS — R52 Pain, unspecified: Secondary | ICD-10-CM

## 2018-05-15 DIAGNOSIS — E11319 Type 2 diabetes mellitus with unspecified diabetic retinopathy without macular edema: Secondary | ICD-10-CM

## 2018-05-15 DIAGNOSIS — E785 Hyperlipidemia, unspecified: Secondary | ICD-10-CM | POA: Insufficient documentation

## 2018-05-15 DIAGNOSIS — D631 Anemia in chronic kidney disease: Secondary | ICD-10-CM

## 2018-05-15 DIAGNOSIS — E1129 Type 2 diabetes mellitus with other diabetic kidney complication: Secondary | ICD-10-CM

## 2018-05-15 DIAGNOSIS — R197 Diarrhea, unspecified: Secondary | ICD-10-CM

## 2018-05-15 DIAGNOSIS — E162 Hypoglycemia, unspecified: Secondary | ICD-10-CM

## 2018-05-15 DIAGNOSIS — L299 Pruritus, unspecified: Secondary | ICD-10-CM

## 2018-05-15 DIAGNOSIS — D689 Coagulation defect, unspecified: Secondary | ICD-10-CM

## 2018-05-15 DIAGNOSIS — I5020 Unspecified systolic (congestive) heart failure: Secondary | ICD-10-CM | POA: Insufficient documentation

## 2018-05-15 DIAGNOSIS — N186 End stage renal disease: Secondary | ICD-10-CM

## 2018-05-15 DIAGNOSIS — Z992 Dependence on renal dialysis: Secondary | ICD-10-CM | POA: Diagnosis not present

## 2018-05-15 DIAGNOSIS — R0602 Shortness of breath: Secondary | ICD-10-CM

## 2018-05-15 DIAGNOSIS — I5089 Other heart failure: Secondary | ICD-10-CM

## 2018-05-15 HISTORY — DX: Other heart failure: I50.89

## 2018-05-15 HISTORY — DX: Pain, unspecified: R52

## 2018-05-15 HISTORY — DX: Coagulation defect, unspecified: D68.9

## 2018-05-15 HISTORY — DX: Pruritus, unspecified: L29.9

## 2018-05-15 HISTORY — DX: Type 2 diabetes mellitus with unspecified diabetic retinopathy without macular edema: E11.319

## 2018-05-15 HISTORY — DX: Unspecified systolic (congestive) heart failure: I50.20

## 2018-05-15 HISTORY — DX: Hyperlipidemia, unspecified: E78.5

## 2018-05-15 HISTORY — DX: Hypertensive chronic kidney disease with stage 5 chronic kidney disease or end stage renal disease: I12.0

## 2018-05-15 HISTORY — DX: Shortness of breath: R06.02

## 2018-05-15 HISTORY — DX: End stage renal disease: N18.6

## 2018-05-15 HISTORY — DX: Hematuria, unspecified: R31.9

## 2018-05-15 HISTORY — DX: Type 2 diabetes mellitus with other diabetic kidney complication: E11.29

## 2018-05-15 HISTORY — DX: Peripheral vascular disease, unspecified: I73.9

## 2018-05-15 HISTORY — DX: Chronic kidney disease, unspecified: D63.1

## 2018-05-15 HISTORY — DX: Hypothyroidism, unspecified: E03.9

## 2018-05-15 HISTORY — DX: Unspecified atrial fibrillation: I48.91

## 2018-05-15 HISTORY — DX: Hypoglycemia, unspecified: E16.2

## 2018-05-15 HISTORY — DX: Encounter for adjustment and management of vascular access device: Z45.2

## 2018-05-15 HISTORY — DX: Diarrhea, unspecified: R19.7

## 2018-05-15 HISTORY — DX: Gastro-esophageal reflux disease without esophagitis: K21.9

## 2018-05-16 DIAGNOSIS — D631 Anemia in chronic kidney disease: Secondary | ICD-10-CM | POA: Diagnosis not present

## 2018-05-16 DIAGNOSIS — D509 Iron deficiency anemia, unspecified: Secondary | ICD-10-CM | POA: Diagnosis not present

## 2018-05-16 DIAGNOSIS — E1129 Type 2 diabetes mellitus with other diabetic kidney complication: Secondary | ICD-10-CM | POA: Diagnosis not present

## 2018-05-16 DIAGNOSIS — D689 Coagulation defect, unspecified: Secondary | ICD-10-CM | POA: Diagnosis not present

## 2018-05-16 DIAGNOSIS — N2581 Secondary hyperparathyroidism of renal origin: Secondary | ICD-10-CM | POA: Diagnosis not present

## 2018-05-16 DIAGNOSIS — A4101 Sepsis due to Methicillin susceptible Staphylococcus aureus: Secondary | ICD-10-CM | POA: Diagnosis not present

## 2018-05-16 DIAGNOSIS — Z452 Encounter for adjustment and management of vascular access device: Secondary | ICD-10-CM | POA: Diagnosis not present

## 2018-05-16 DIAGNOSIS — N186 End stage renal disease: Secondary | ICD-10-CM | POA: Diagnosis not present

## 2018-05-16 DIAGNOSIS — R509 Fever, unspecified: Secondary | ICD-10-CM | POA: Diagnosis not present

## 2018-05-17 DIAGNOSIS — E1140 Type 2 diabetes mellitus with diabetic neuropathy, unspecified: Secondary | ICD-10-CM | POA: Diagnosis not present

## 2018-05-17 DIAGNOSIS — D509 Iron deficiency anemia, unspecified: Secondary | ICD-10-CM | POA: Diagnosis not present

## 2018-05-17 DIAGNOSIS — G4700 Insomnia, unspecified: Secondary | ICD-10-CM | POA: Diagnosis not present

## 2018-05-17 DIAGNOSIS — E118 Type 2 diabetes mellitus with unspecified complications: Secondary | ICD-10-CM | POA: Diagnosis not present

## 2018-05-17 DIAGNOSIS — R300 Dysuria: Secondary | ICD-10-CM | POA: Diagnosis not present

## 2018-05-18 DIAGNOSIS — Z452 Encounter for adjustment and management of vascular access device: Secondary | ICD-10-CM | POA: Diagnosis not present

## 2018-05-18 DIAGNOSIS — N2581 Secondary hyperparathyroidism of renal origin: Secondary | ICD-10-CM | POA: Diagnosis not present

## 2018-05-18 DIAGNOSIS — A4101 Sepsis due to Methicillin susceptible Staphylococcus aureus: Secondary | ICD-10-CM | POA: Diagnosis not present

## 2018-05-18 DIAGNOSIS — R509 Fever, unspecified: Secondary | ICD-10-CM | POA: Insufficient documentation

## 2018-05-18 DIAGNOSIS — D689 Coagulation defect, unspecified: Secondary | ICD-10-CM | POA: Diagnosis not present

## 2018-05-18 DIAGNOSIS — D509 Iron deficiency anemia, unspecified: Secondary | ICD-10-CM | POA: Diagnosis not present

## 2018-05-18 DIAGNOSIS — D631 Anemia in chronic kidney disease: Secondary | ICD-10-CM | POA: Diagnosis not present

## 2018-05-18 DIAGNOSIS — E1129 Type 2 diabetes mellitus with other diabetic kidney complication: Secondary | ICD-10-CM | POA: Diagnosis not present

## 2018-05-18 DIAGNOSIS — N186 End stage renal disease: Secondary | ICD-10-CM | POA: Diagnosis not present

## 2018-05-18 HISTORY — DX: Fever, unspecified: R50.9

## 2018-05-20 ENCOUNTER — Telehealth: Payer: Self-pay | Admitting: Cardiology

## 2018-05-20 NOTE — Telephone Encounter (Signed)
Patient states that he started dialysis on Thursday, 05/16/2018 and his dialysis schedule is now on Tuesday, Thursday, Saturday. He reports that his torsemide and metolazone have been discontinued and he has been instructed to hold his blood pressure medications on dialysis days due to hypotension. Encouraged patient to follow these instructions. Patient is agreeable and just wanted to make Dr. Bettina Gavia aware.   Will contact patient with any updates or recommendations from Dr. Bettina Gavia. Patient verbalized understanding. No further questions.

## 2018-05-20 NOTE — Telephone Encounter (Signed)
Patient having BP issues and would like a call.

## 2018-05-21 DIAGNOSIS — R509 Fever, unspecified: Secondary | ICD-10-CM | POA: Diagnosis not present

## 2018-05-21 DIAGNOSIS — E1129 Type 2 diabetes mellitus with other diabetic kidney complication: Secondary | ICD-10-CM | POA: Diagnosis not present

## 2018-05-21 DIAGNOSIS — D631 Anemia in chronic kidney disease: Secondary | ICD-10-CM | POA: Diagnosis not present

## 2018-05-21 DIAGNOSIS — Z452 Encounter for adjustment and management of vascular access device: Secondary | ICD-10-CM | POA: Diagnosis not present

## 2018-05-21 DIAGNOSIS — D689 Coagulation defect, unspecified: Secondary | ICD-10-CM | POA: Diagnosis not present

## 2018-05-21 DIAGNOSIS — N186 End stage renal disease: Secondary | ICD-10-CM | POA: Diagnosis not present

## 2018-05-21 DIAGNOSIS — A4101 Sepsis due to Methicillin susceptible Staphylococcus aureus: Secondary | ICD-10-CM | POA: Diagnosis not present

## 2018-05-21 DIAGNOSIS — D509 Iron deficiency anemia, unspecified: Secondary | ICD-10-CM | POA: Diagnosis not present

## 2018-05-21 DIAGNOSIS — N2581 Secondary hyperparathyroidism of renal origin: Secondary | ICD-10-CM

## 2018-05-21 HISTORY — DX: Secondary hyperparathyroidism of renal origin: N25.81

## 2018-05-21 NOTE — Telephone Encounter (Signed)
Left message on patient's cell phone per DPR informing patient that Dr. Bettina Gavia agrees with the nephrologist's recommendations regarding medication changes. Advised patient to contact our office with further questions or concerns.

## 2018-05-21 NOTE — Telephone Encounter (Signed)
I agree with his meds

## 2018-05-23 ENCOUNTER — Other Ambulatory Visit: Payer: Self-pay

## 2018-05-23 DIAGNOSIS — N186 End stage renal disease: Secondary | ICD-10-CM | POA: Diagnosis not present

## 2018-05-23 DIAGNOSIS — R509 Fever, unspecified: Secondary | ICD-10-CM | POA: Diagnosis not present

## 2018-05-23 DIAGNOSIS — Z452 Encounter for adjustment and management of vascular access device: Secondary | ICD-10-CM | POA: Diagnosis not present

## 2018-05-23 DIAGNOSIS — D631 Anemia in chronic kidney disease: Secondary | ICD-10-CM | POA: Diagnosis not present

## 2018-05-23 DIAGNOSIS — D509 Iron deficiency anemia, unspecified: Secondary | ICD-10-CM | POA: Diagnosis not present

## 2018-05-23 DIAGNOSIS — A4101 Sepsis due to Methicillin susceptible Staphylococcus aureus: Secondary | ICD-10-CM | POA: Diagnosis not present

## 2018-05-23 DIAGNOSIS — E1129 Type 2 diabetes mellitus with other diabetic kidney complication: Secondary | ICD-10-CM | POA: Diagnosis not present

## 2018-05-23 DIAGNOSIS — N2581 Secondary hyperparathyroidism of renal origin: Secondary | ICD-10-CM | POA: Diagnosis not present

## 2018-05-23 DIAGNOSIS — N185 Chronic kidney disease, stage 5: Secondary | ICD-10-CM

## 2018-05-23 DIAGNOSIS — D689 Coagulation defect, unspecified: Secondary | ICD-10-CM | POA: Diagnosis not present

## 2018-05-24 DIAGNOSIS — E1122 Type 2 diabetes mellitus with diabetic chronic kidney disease: Secondary | ICD-10-CM | POA: Diagnosis not present

## 2018-05-24 DIAGNOSIS — N186 End stage renal disease: Secondary | ICD-10-CM | POA: Diagnosis not present

## 2018-05-24 DIAGNOSIS — Z992 Dependence on renal dialysis: Secondary | ICD-10-CM | POA: Diagnosis not present

## 2018-05-27 DIAGNOSIS — E876 Hypokalemia: Secondary | ICD-10-CM

## 2018-05-27 DIAGNOSIS — Z992 Dependence on renal dialysis: Secondary | ICD-10-CM | POA: Diagnosis not present

## 2018-05-27 DIAGNOSIS — N186 End stage renal disease: Secondary | ICD-10-CM | POA: Diagnosis not present

## 2018-05-27 HISTORY — DX: Hypokalemia: E87.6

## 2018-05-28 ENCOUNTER — Ambulatory Visit: Payer: Medicare Other | Admitting: Cardiology

## 2018-05-28 DIAGNOSIS — Z23 Encounter for immunization: Secondary | ICD-10-CM | POA: Diagnosis not present

## 2018-05-28 DIAGNOSIS — Z452 Encounter for adjustment and management of vascular access device: Secondary | ICD-10-CM | POA: Diagnosis not present

## 2018-05-28 DIAGNOSIS — E876 Hypokalemia: Secondary | ICD-10-CM | POA: Diagnosis not present

## 2018-05-28 DIAGNOSIS — A4101 Sepsis due to Methicillin susceptible Staphylococcus aureus: Secondary | ICD-10-CM | POA: Diagnosis not present

## 2018-05-28 DIAGNOSIS — N2581 Secondary hyperparathyroidism of renal origin: Secondary | ICD-10-CM | POA: Diagnosis not present

## 2018-05-28 DIAGNOSIS — E1129 Type 2 diabetes mellitus with other diabetic kidney complication: Secondary | ICD-10-CM | POA: Diagnosis not present

## 2018-05-28 DIAGNOSIS — N186 End stage renal disease: Secondary | ICD-10-CM | POA: Diagnosis not present

## 2018-05-28 DIAGNOSIS — D689 Coagulation defect, unspecified: Secondary | ICD-10-CM | POA: Diagnosis not present

## 2018-05-28 DIAGNOSIS — D631 Anemia in chronic kidney disease: Secondary | ICD-10-CM | POA: Diagnosis not present

## 2018-05-30 DIAGNOSIS — D689 Coagulation defect, unspecified: Secondary | ICD-10-CM | POA: Diagnosis not present

## 2018-05-30 DIAGNOSIS — Z23 Encounter for immunization: Secondary | ICD-10-CM | POA: Diagnosis not present

## 2018-05-30 DIAGNOSIS — E1129 Type 2 diabetes mellitus with other diabetic kidney complication: Secondary | ICD-10-CM | POA: Diagnosis not present

## 2018-05-30 DIAGNOSIS — E876 Hypokalemia: Secondary | ICD-10-CM | POA: Diagnosis not present

## 2018-05-30 DIAGNOSIS — N2581 Secondary hyperparathyroidism of renal origin: Secondary | ICD-10-CM | POA: Diagnosis not present

## 2018-05-30 DIAGNOSIS — D631 Anemia in chronic kidney disease: Secondary | ICD-10-CM | POA: Diagnosis not present

## 2018-05-30 DIAGNOSIS — N186 End stage renal disease: Secondary | ICD-10-CM | POA: Diagnosis not present

## 2018-05-30 DIAGNOSIS — A4101 Sepsis due to Methicillin susceptible Staphylococcus aureus: Secondary | ICD-10-CM | POA: Diagnosis not present

## 2018-05-30 DIAGNOSIS — Z452 Encounter for adjustment and management of vascular access device: Secondary | ICD-10-CM | POA: Diagnosis not present

## 2018-05-31 DIAGNOSIS — E1140 Type 2 diabetes mellitus with diabetic neuropathy, unspecified: Secondary | ICD-10-CM | POA: Diagnosis not present

## 2018-05-31 DIAGNOSIS — G4700 Insomnia, unspecified: Secondary | ICD-10-CM | POA: Diagnosis not present

## 2018-05-31 DIAGNOSIS — N184 Chronic kidney disease, stage 4 (severe): Secondary | ICD-10-CM | POA: Diagnosis not present

## 2018-05-31 DIAGNOSIS — D509 Iron deficiency anemia, unspecified: Secondary | ICD-10-CM | POA: Diagnosis not present

## 2018-05-31 DIAGNOSIS — E118 Type 2 diabetes mellitus with unspecified complications: Secondary | ICD-10-CM | POA: Diagnosis not present

## 2018-06-01 DIAGNOSIS — Z452 Encounter for adjustment and management of vascular access device: Secondary | ICD-10-CM | POA: Diagnosis not present

## 2018-06-01 DIAGNOSIS — D631 Anemia in chronic kidney disease: Secondary | ICD-10-CM | POA: Diagnosis not present

## 2018-06-01 DIAGNOSIS — D689 Coagulation defect, unspecified: Secondary | ICD-10-CM | POA: Diagnosis not present

## 2018-06-01 DIAGNOSIS — E1129 Type 2 diabetes mellitus with other diabetic kidney complication: Secondary | ICD-10-CM | POA: Diagnosis not present

## 2018-06-01 DIAGNOSIS — Z23 Encounter for immunization: Secondary | ICD-10-CM | POA: Diagnosis not present

## 2018-06-01 DIAGNOSIS — E876 Hypokalemia: Secondary | ICD-10-CM | POA: Diagnosis not present

## 2018-06-01 DIAGNOSIS — A4101 Sepsis due to Methicillin susceptible Staphylococcus aureus: Secondary | ICD-10-CM | POA: Diagnosis not present

## 2018-06-01 DIAGNOSIS — N2581 Secondary hyperparathyroidism of renal origin: Secondary | ICD-10-CM | POA: Diagnosis not present

## 2018-06-01 DIAGNOSIS — N186 End stage renal disease: Secondary | ICD-10-CM | POA: Diagnosis not present

## 2018-06-04 DIAGNOSIS — E876 Hypokalemia: Secondary | ICD-10-CM | POA: Diagnosis not present

## 2018-06-04 DIAGNOSIS — Z23 Encounter for immunization: Secondary | ICD-10-CM | POA: Diagnosis not present

## 2018-06-04 DIAGNOSIS — E1129 Type 2 diabetes mellitus with other diabetic kidney complication: Secondary | ICD-10-CM | POA: Diagnosis not present

## 2018-06-04 DIAGNOSIS — D631 Anemia in chronic kidney disease: Secondary | ICD-10-CM | POA: Diagnosis not present

## 2018-06-04 DIAGNOSIS — A4101 Sepsis due to Methicillin susceptible Staphylococcus aureus: Secondary | ICD-10-CM | POA: Diagnosis not present

## 2018-06-04 DIAGNOSIS — Z452 Encounter for adjustment and management of vascular access device: Secondary | ICD-10-CM | POA: Diagnosis not present

## 2018-06-04 DIAGNOSIS — D689 Coagulation defect, unspecified: Secondary | ICD-10-CM | POA: Diagnosis not present

## 2018-06-04 DIAGNOSIS — N186 End stage renal disease: Secondary | ICD-10-CM | POA: Diagnosis not present

## 2018-06-04 DIAGNOSIS — N2581 Secondary hyperparathyroidism of renal origin: Secondary | ICD-10-CM | POA: Diagnosis not present

## 2018-06-06 DIAGNOSIS — D631 Anemia in chronic kidney disease: Secondary | ICD-10-CM | POA: Diagnosis not present

## 2018-06-06 DIAGNOSIS — Z452 Encounter for adjustment and management of vascular access device: Secondary | ICD-10-CM | POA: Diagnosis not present

## 2018-06-06 DIAGNOSIS — E876 Hypokalemia: Secondary | ICD-10-CM | POA: Diagnosis not present

## 2018-06-06 DIAGNOSIS — D689 Coagulation defect, unspecified: Secondary | ICD-10-CM | POA: Diagnosis not present

## 2018-06-06 DIAGNOSIS — A4101 Sepsis due to Methicillin susceptible Staphylococcus aureus: Secondary | ICD-10-CM | POA: Diagnosis not present

## 2018-06-06 DIAGNOSIS — N2581 Secondary hyperparathyroidism of renal origin: Secondary | ICD-10-CM | POA: Diagnosis not present

## 2018-06-06 DIAGNOSIS — Z23 Encounter for immunization: Secondary | ICD-10-CM | POA: Diagnosis not present

## 2018-06-06 DIAGNOSIS — N186 End stage renal disease: Secondary | ICD-10-CM | POA: Diagnosis not present

## 2018-06-06 DIAGNOSIS — E1129 Type 2 diabetes mellitus with other diabetic kidney complication: Secondary | ICD-10-CM | POA: Diagnosis not present

## 2018-06-07 DIAGNOSIS — D631 Anemia in chronic kidney disease: Secondary | ICD-10-CM | POA: Diagnosis not present

## 2018-06-07 DIAGNOSIS — E876 Hypokalemia: Secondary | ICD-10-CM | POA: Diagnosis not present

## 2018-06-07 DIAGNOSIS — N186 End stage renal disease: Secondary | ICD-10-CM | POA: Diagnosis not present

## 2018-06-07 DIAGNOSIS — A4101 Sepsis due to Methicillin susceptible Staphylococcus aureus: Secondary | ICD-10-CM | POA: Diagnosis not present

## 2018-06-07 DIAGNOSIS — Z452 Encounter for adjustment and management of vascular access device: Secondary | ICD-10-CM | POA: Diagnosis not present

## 2018-06-07 DIAGNOSIS — D689 Coagulation defect, unspecified: Secondary | ICD-10-CM | POA: Diagnosis not present

## 2018-06-07 DIAGNOSIS — N2581 Secondary hyperparathyroidism of renal origin: Secondary | ICD-10-CM | POA: Diagnosis not present

## 2018-06-07 DIAGNOSIS — Z23 Encounter for immunization: Secondary | ICD-10-CM | POA: Diagnosis not present

## 2018-06-07 DIAGNOSIS — E1129 Type 2 diabetes mellitus with other diabetic kidney complication: Secondary | ICD-10-CM | POA: Diagnosis not present

## 2018-06-10 DIAGNOSIS — N2581 Secondary hyperparathyroidism of renal origin: Secondary | ICD-10-CM | POA: Diagnosis not present

## 2018-06-10 DIAGNOSIS — D689 Coagulation defect, unspecified: Secondary | ICD-10-CM | POA: Diagnosis not present

## 2018-06-10 DIAGNOSIS — E876 Hypokalemia: Secondary | ICD-10-CM | POA: Diagnosis not present

## 2018-06-10 DIAGNOSIS — D631 Anemia in chronic kidney disease: Secondary | ICD-10-CM | POA: Diagnosis not present

## 2018-06-10 DIAGNOSIS — A4101 Sepsis due to Methicillin susceptible Staphylococcus aureus: Secondary | ICD-10-CM | POA: Diagnosis not present

## 2018-06-10 DIAGNOSIS — Z452 Encounter for adjustment and management of vascular access device: Secondary | ICD-10-CM | POA: Diagnosis not present

## 2018-06-10 DIAGNOSIS — Z23 Encounter for immunization: Secondary | ICD-10-CM | POA: Diagnosis not present

## 2018-06-10 DIAGNOSIS — E1129 Type 2 diabetes mellitus with other diabetic kidney complication: Secondary | ICD-10-CM | POA: Diagnosis not present

## 2018-06-10 DIAGNOSIS — N186 End stage renal disease: Secondary | ICD-10-CM | POA: Diagnosis not present

## 2018-06-12 DIAGNOSIS — E876 Hypokalemia: Secondary | ICD-10-CM | POA: Diagnosis not present

## 2018-06-12 DIAGNOSIS — N2581 Secondary hyperparathyroidism of renal origin: Secondary | ICD-10-CM | POA: Diagnosis not present

## 2018-06-12 DIAGNOSIS — D631 Anemia in chronic kidney disease: Secondary | ICD-10-CM | POA: Diagnosis not present

## 2018-06-12 DIAGNOSIS — Z23 Encounter for immunization: Secondary | ICD-10-CM | POA: Diagnosis not present

## 2018-06-12 DIAGNOSIS — E1129 Type 2 diabetes mellitus with other diabetic kidney complication: Secondary | ICD-10-CM | POA: Diagnosis not present

## 2018-06-12 DIAGNOSIS — N186 End stage renal disease: Secondary | ICD-10-CM | POA: Diagnosis not present

## 2018-06-12 DIAGNOSIS — Z452 Encounter for adjustment and management of vascular access device: Secondary | ICD-10-CM | POA: Diagnosis not present

## 2018-06-12 DIAGNOSIS — D689 Coagulation defect, unspecified: Secondary | ICD-10-CM | POA: Diagnosis not present

## 2018-06-12 DIAGNOSIS — A4101 Sepsis due to Methicillin susceptible Staphylococcus aureus: Secondary | ICD-10-CM | POA: Diagnosis not present

## 2018-06-13 DIAGNOSIS — D509 Iron deficiency anemia, unspecified: Secondary | ICD-10-CM | POA: Diagnosis not present

## 2018-06-13 DIAGNOSIS — N184 Chronic kidney disease, stage 4 (severe): Secondary | ICD-10-CM | POA: Diagnosis not present

## 2018-06-13 DIAGNOSIS — E785 Hyperlipidemia, unspecified: Secondary | ICD-10-CM | POA: Diagnosis not present

## 2018-06-13 DIAGNOSIS — E118 Type 2 diabetes mellitus with unspecified complications: Secondary | ICD-10-CM | POA: Diagnosis not present

## 2018-06-13 DIAGNOSIS — I10 Essential (primary) hypertension: Secondary | ICD-10-CM | POA: Diagnosis not present

## 2018-06-14 DIAGNOSIS — Z23 Encounter for immunization: Secondary | ICD-10-CM | POA: Diagnosis not present

## 2018-06-14 DIAGNOSIS — A4101 Sepsis due to Methicillin susceptible Staphylococcus aureus: Secondary | ICD-10-CM | POA: Diagnosis not present

## 2018-06-14 DIAGNOSIS — Z452 Encounter for adjustment and management of vascular access device: Secondary | ICD-10-CM | POA: Diagnosis not present

## 2018-06-14 DIAGNOSIS — D689 Coagulation defect, unspecified: Secondary | ICD-10-CM | POA: Diagnosis not present

## 2018-06-14 DIAGNOSIS — E1129 Type 2 diabetes mellitus with other diabetic kidney complication: Secondary | ICD-10-CM | POA: Diagnosis not present

## 2018-06-14 DIAGNOSIS — D631 Anemia in chronic kidney disease: Secondary | ICD-10-CM | POA: Diagnosis not present

## 2018-06-14 DIAGNOSIS — N2581 Secondary hyperparathyroidism of renal origin: Secondary | ICD-10-CM | POA: Diagnosis not present

## 2018-06-14 DIAGNOSIS — E876 Hypokalemia: Secondary | ICD-10-CM | POA: Diagnosis not present

## 2018-06-14 DIAGNOSIS — N186 End stage renal disease: Secondary | ICD-10-CM | POA: Diagnosis not present

## 2018-06-17 DIAGNOSIS — E876 Hypokalemia: Secondary | ICD-10-CM | POA: Diagnosis not present

## 2018-06-17 DIAGNOSIS — A4101 Sepsis due to Methicillin susceptible Staphylococcus aureus: Secondary | ICD-10-CM | POA: Diagnosis not present

## 2018-06-17 DIAGNOSIS — E1129 Type 2 diabetes mellitus with other diabetic kidney complication: Secondary | ICD-10-CM | POA: Diagnosis not present

## 2018-06-17 DIAGNOSIS — Z452 Encounter for adjustment and management of vascular access device: Secondary | ICD-10-CM | POA: Diagnosis not present

## 2018-06-17 DIAGNOSIS — D631 Anemia in chronic kidney disease: Secondary | ICD-10-CM | POA: Diagnosis not present

## 2018-06-17 DIAGNOSIS — N2581 Secondary hyperparathyroidism of renal origin: Secondary | ICD-10-CM | POA: Diagnosis not present

## 2018-06-17 DIAGNOSIS — N186 End stage renal disease: Secondary | ICD-10-CM | POA: Diagnosis not present

## 2018-06-17 DIAGNOSIS — Z23 Encounter for immunization: Secondary | ICD-10-CM | POA: Diagnosis not present

## 2018-06-17 DIAGNOSIS — D689 Coagulation defect, unspecified: Secondary | ICD-10-CM | POA: Diagnosis not present

## 2018-06-18 DIAGNOSIS — N186 End stage renal disease: Secondary | ICD-10-CM | POA: Diagnosis not present

## 2018-06-18 DIAGNOSIS — A4101 Sepsis due to Methicillin susceptible Staphylococcus aureus: Secondary | ICD-10-CM | POA: Diagnosis not present

## 2018-06-18 DIAGNOSIS — Z452 Encounter for adjustment and management of vascular access device: Secondary | ICD-10-CM | POA: Diagnosis not present

## 2018-06-18 DIAGNOSIS — E8770 Fluid overload, unspecified: Secondary | ICD-10-CM | POA: Diagnosis not present

## 2018-06-18 DIAGNOSIS — Z4802 Encounter for removal of sutures: Secondary | ICD-10-CM | POA: Insufficient documentation

## 2018-06-18 DIAGNOSIS — N2581 Secondary hyperparathyroidism of renal origin: Secondary | ICD-10-CM | POA: Diagnosis not present

## 2018-06-18 DIAGNOSIS — D689 Coagulation defect, unspecified: Secondary | ICD-10-CM | POA: Diagnosis not present

## 2018-06-18 DIAGNOSIS — E1129 Type 2 diabetes mellitus with other diabetic kidney complication: Secondary | ICD-10-CM | POA: Diagnosis not present

## 2018-06-18 DIAGNOSIS — E876 Hypokalemia: Secondary | ICD-10-CM | POA: Diagnosis not present

## 2018-06-18 DIAGNOSIS — D631 Anemia in chronic kidney disease: Secondary | ICD-10-CM | POA: Diagnosis not present

## 2018-06-18 DIAGNOSIS — Z23 Encounter for immunization: Secondary | ICD-10-CM | POA: Diagnosis not present

## 2018-06-18 HISTORY — DX: Encounter for removal of sutures: Z48.02

## 2018-06-18 NOTE — Progress Notes (Signed)
Cardiology Office Note:    Date:  06/20/2018   ID:  James Chan, DOB [birth year], MRN [id number]  PCP:  Cher Nakai, MD  Cardiologist:  Shirlee More, MD    Referring MD: Cher Nakai, MD    ASSESSMENT:    1. Chronic combined systolic and diastolic heart failure ([place])   2. Coronary artery disease involving native coronary artery of native heart without angina pectoris   3. PAF (paroxysmal atrial fibrillation) ([place])   4. On amiodarone therapy   5. Stage 4 chronic kidney disease ([place])   6. Iron deficiency anemia, unspecified iron deficiency anemia type   7. Dyslipidemia    PLAN:    In order of problems listed above:  1. Improved with renal replacement therapy no longer takes diuretics today at 181 pounds he has no volume overload and is compensators.  Management from this point forward is through ultrafiltration hemodialysis. 2. Stable CAD continue his medical therapy including clopidogrel beta-blocker and high intensity statin 3. Stable maintaining sinus rhythm continue low-dose amiodarone labs for toxicity are followed by his nephrologist I will ask him to be sure they check liver function and thyroid every 6 months have included been in correspondence 4. Worsened CKD and stage now and weight maintenance renal replacement therapy 5. Improved managed by nephrology no longer requires IV iron 6. Stable continue statin I will asked him to check a lipid profile copy to me with his next labs   Next appointment: 6 months   Medication Adjustments/Labs and Tests Ordered: Current medicines are reviewed at length with the patient today.  Concerns regarding medicines are outlined above.  No orders of the defined types were placed in this encounter.  Meds ordered this encounter  Medications  . hydrALAZINE (APRESOLINE) 25 MG tablet    Sig: Take 1 tablet (25 mg) by mouth twice daily except for dialysis days only take 1 tablet in the evening.    Chief Complaint  Patient presents with    . Follow-up    for   . Congestive Heart Failure  . Coronary Artery Disease  . Chronic Kidney Disease    on RRT hemodialysis    History of Present Illness:    James Chan is a 68 y.o. 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 Compliance with diet, lifestyle and medications: Yes  Unfortunately he does not feel well he had a dialysis access infection with bacteremia and overall he just feels very weak poor appetite and has been short of breath until they reached his dry weight.  He is now down to 181 pounds down 15 pounds today is not short of breath he complains of hypotension during and after dialysis and asked him to omit his hydralazine the morning of hemodialysis.  No chest pain orthopnea palpitation syncope. Past Medical History:  Diagnosis Date  . Anemia   . Anxiety state, unspecified 09/15/2008   Qualifier: Diagnosis of  By: Bullins CMA, Ami    . Arterial insufficiency of lower extremity ([place]) 05/10/2016  . Arthritis   . BACK PAIN, LUMBAR, CHRONIC 09/15/2008   Qualifier: Diagnosis of  By: Bullins CMA, Ami    .  CHF (congestive heart failure) ([place])   . Chronic kidney disease   . Claudication ([place]) 10/30/2014  . Comprehensive diabetic foot examination, type 2 DM, encounter for [facility]) 09/15/2008   Qualifier: Diagnosis of  By: Bullins CMA, Ami    . DEPRESSIVE DISORDER 09/15/2008   Qualifier: Diagnosis of  By: Bullins CMA, Ami    . Diabetes mellitus without complication ([place])   . Diabetic polyneuropathy associated with diabetes mellitus due to underlying condition ([place]) 06/28/2015  . Dyslipidemia 11/24/2015  . Esophageal reflux 09/15/2008   Qualifier: Diagnosis  of  By: Bullins CMA, Ami    . Glaucoma   . Gout   . Hammer toes of both feet 06/28/2015  . History of carotid artery stenosis 10/30/2014  . 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 Hyperlipidemia   . Hypertension   . Hypertensive heart failure ([place]) 11/28/2016  . HYPERTRIGLYCERIDEMIA 09/15/2008   Qualifier: Diagnosis of  By: Bullins CMA, Ami    . Myocardial infarction ([place])   . NSTEMI (non-ST elevated myocardial infarction) ([place]) 09/15/2008   Qualifier: Diagnosis of  By: Bullins CMA, Ami    . Onychomycosis due to dermatophyte 06/28/2015  . PAD (peripheral artery disease) ([place]) 10/30/2014  . Stroke [facility])     Past Surgical History:  Procedure Laterality Date  . angioplasty of iliofemoral and iliac artery with stent placement      Current Medications: Current Meds  Medication Sig  . allopurinol (ZYLOPRIM) 100 MG tablet Take 100 mg by mouth daily.  Marland Kitchen amiodarone (PACERONE) 200 MG tablet Take 1 tablet (200 mg total) by mouth daily.  Marland Kitchen atorvastatin (LIPITOR) 40 MG tablet Take 40 mg by mouth daily.  . cloNIDine (CATAPRES) 0.1 MG tablet Take 1 tablet (0.1 mg) in the morning if SBP (top BP number) > 160 and take 1 tablet (0.1 mg) at bedtime.  . clopidogrel (PLAVIX) 75 MG tablet Take 75 mg by mouth daily.  . hydrALAZINE (APRESOLINE) 25 MG tablet Take 1 tablet (25 mg) by mouth twice daily except for dialysis days only take 1 tablet in the evening.  . insulin detemir (LEVEMIR) 100 UNIT/ML injection Inject 30 Units into the skin as directed. Sliding scale  . latanoprost (XALATAN) 0.005 % ophthalmic solution Place 1 drop into both eyes at bedtime.  . metoprolol tartrate (LOPRESSOR) 25 MG tablet TK 1 T PO BID. HOLD IF HEART RATE IS BELOW 55.  . nitroGLYCERIN (NITROSTAT) 0.4 MG SL tablet Place 1 tablet (0.4 mg total) under the tongue every 5 (five) minutes as needed for chest pain.  Marland Kitchen oxyCODONE-acetaminophen (PERCOCET/ROXICET) 5-325 MG tablet Take 1 tablet by mouth every 4 (four) hours  as needed for moderate pain or severe pain.   Marland Kitchen PROAIR HFA 108 (90 Base) MCG/ACT inhaler Inhale 2 puffs into the lungs every 6 (six) hours as needed.  . sildenafil (VIAGRA) 25 MG tablet Take 1 tablet (25 mg total) daily as needed by mouth for erectile dysfunction.  . [DISCONTINUED] hydrALAZINE (APRESOLINE) 25 MG tablet Take 25 mg by mouth 2 (two) times daily.     Allergies:   Patient has no known allergies.   Social History   Socioeconomic History  . Marital status: Married    Spouse name: Not on file  . Number of children: Not on file  . Years of education: Not on file  . Highest education level: Not on file  Occupational History  . Not on file  Social Needs  . Financial resource strain: Not on file  .  Food insecurity:    Worry: Not on file    Inability: Not on file  . Transportation needs:    Medical: Not on file    Non-medical: Not on file  Tobacco Use  . Smoking status: Former Smoker    Last attempt to quit: 05/09/1994    Years since quitting: 24.1  . Smokeless tobacco: Never Used  Substance and Sexual Activity  . Alcohol use: No  . Drug use: No  . Sexual activity: Not on file  Lifestyle  . Physical activity:    Days per week: Not on file    Minutes per session: Not on file  . Stress: Not on file  Relationships  . Social connections:    Talks on phone: Not on file    Gets together: Not on file    Attends religious service: Not on file    Active member of club or organization: Not on file    Attends meetings of clubs or organizations: Not on file    Relationship status: Not on file  Other Topics Concern  . Not on file  Social History Narrative  . Not on file     Family History: The patient's family history includes Cancer in his father and mother; Heart disease in his father and mother. ROS:   Please see the history of present illness.    All other systems reviewed and are negative.  EKGs/Labs/Other Studies Reviewed:    The following studies were  reviewed today:  Recent Labs: 05/09/2018: ALT 16; BUN 106; Creatinine, Ser 3.98; Hemoglobin 8.8; Platelets 522; Potassium 4.8; Sodium 131  Recent Lipid Panel    Component Value Date/Time   CHOL 147 07/02/2017 0939   TRIG 143 07/02/2017 0939   HDL 32 (L) 07/02/2017 0939   CHOLHDL 4.6 07/02/2017 0939   LDLCALC 86 07/02/2017 0939    Physical Exam:    VS:  BP (!) 148/70 (BP Location: Right Arm, Patient Position: Sitting, Cuff Size: Normal)   Pulse 64   Ht 6' (1.829 m)   Wt 184 lb 12.8 oz (83.8 kg)   SpO2 97%   BMI 25.06 kg/m     Wt Readings from Last 3 Encounters:  06/20/18 184 lb 12.8 oz (83.8 kg)  05/08/18 199 lb 8 oz (90.5 kg)  02/05/18 199 lb 12.8 oz (90.6 kg)     GEN: He looks chronically ill well nourished, well developed in no acute distress HEENT: Normal NECK: No JVD; No carotid bruits LYMPHATICS: No lymphadenopathy CARDIAC: RRR, no murmurs, rubs, gallops RESPIRATORY:  Clear to auscultation without rales, wheezing or rhonchi  ABDOMEN: Soft, non-tender, non-distended MUSCULOSKELETAL:  No edema; No deformity  SKIN: Warm and dry NEUROLOGIC:  Alert and oriented x 3 PSYCHIATRIC:  Normal affect    Signed, Shirlee More, MD  06/20/2018 1:47 PM    [facility]

## 2018-06-19 DIAGNOSIS — E876 Hypokalemia: Secondary | ICD-10-CM | POA: Diagnosis not present

## 2018-06-19 DIAGNOSIS — Z23 Encounter for immunization: Secondary | ICD-10-CM | POA: Diagnosis not present

## 2018-06-19 DIAGNOSIS — Z452 Encounter for adjustment and management of vascular access device: Secondary | ICD-10-CM | POA: Diagnosis not present

## 2018-06-19 DIAGNOSIS — N186 End stage renal disease: Secondary | ICD-10-CM | POA: Diagnosis not present

## 2018-06-19 DIAGNOSIS — E1129 Type 2 diabetes mellitus with other diabetic kidney complication: Secondary | ICD-10-CM | POA: Diagnosis not present

## 2018-06-19 DIAGNOSIS — N2581 Secondary hyperparathyroidism of renal origin: Secondary | ICD-10-CM | POA: Diagnosis not present

## 2018-06-19 DIAGNOSIS — D689 Coagulation defect, unspecified: Secondary | ICD-10-CM | POA: Diagnosis not present

## 2018-06-19 DIAGNOSIS — D631 Anemia in chronic kidney disease: Secondary | ICD-10-CM | POA: Diagnosis not present

## 2018-06-19 DIAGNOSIS — A4101 Sepsis due to Methicillin susceptible Staphylococcus aureus: Secondary | ICD-10-CM | POA: Diagnosis not present

## 2018-06-20 ENCOUNTER — Ambulatory Visit: Payer: Medicare Other | Admitting: Cardiology

## 2018-06-20 ENCOUNTER — Encounter: Payer: Self-pay | Admitting: Cardiology

## 2018-06-20 VITALS — BP 148/70 | HR 64 | Ht 72.0 in | Wt 184.8 lb

## 2018-06-20 DIAGNOSIS — I5042 Chronic combined systolic (congestive) and diastolic (congestive) heart failure: Secondary | ICD-10-CM | POA: Diagnosis not present

## 2018-06-20 DIAGNOSIS — D509 Iron deficiency anemia, unspecified: Secondary | ICD-10-CM

## 2018-06-20 DIAGNOSIS — I2510 Atherosclerotic heart disease of native coronary artery without angina pectoris: Secondary | ICD-10-CM

## 2018-06-20 DIAGNOSIS — Z79899 Other long term (current) drug therapy: Secondary | ICD-10-CM | POA: Diagnosis not present

## 2018-06-20 DIAGNOSIS — I480 Paroxysmal atrial fibrillation: Secondary | ICD-10-CM | POA: Diagnosis not present

## 2018-06-20 DIAGNOSIS — N184 Chronic kidney disease, stage 4 (severe): Secondary | ICD-10-CM | POA: Diagnosis not present

## 2018-06-20 DIAGNOSIS — E785 Hyperlipidemia, unspecified: Secondary | ICD-10-CM

## 2018-06-20 MED ORDER — HYDRALAZINE HCL 25 MG PO TABS: ORAL_TABLET | ORAL | Status: DC

## 2018-06-20 NOTE — Patient Instructions (Signed)
Medication Instructions:  Your physician has recommended you make the following change in your medication:   CHANGE hydralazine: skip morning dose on hemodialysis days and only take evening dose!   If you need a refill on your cardiac medications before your next appointment, please call your pharmacy.   Lab work: None  If you have labs (blood work) drawn today and your tests are completely normal, you will receive your results only by: Marland Kitchen MyChart Message (if you have MyChart) OR . A paper copy in the mail If you have any lab test that is abnormal or we need to change your treatment, we will call you to review the results.  Testing/Procedures: None  Follow-Up: At [facility], you and your health needs are our priority.  As part of our continuing mission to provide you with exceptional heart care, we have created designated Provider Care Teams.  These Care Teams include your primary Cardiologist (physician) and Advanced Practice Providers (APPs -  Physician Assistants and Nurse Practitioners) who all work together to provide you with the care you need, when you need it. You will need a follow up appointment in 6 months.

## 2018-06-21 DIAGNOSIS — D689 Coagulation defect, unspecified: Secondary | ICD-10-CM | POA: Diagnosis not present

## 2018-06-21 DIAGNOSIS — D631 Anemia in chronic kidney disease: Secondary | ICD-10-CM | POA: Diagnosis not present

## 2018-06-21 DIAGNOSIS — A4101 Sepsis due to Methicillin susceptible Staphylococcus aureus: Secondary | ICD-10-CM | POA: Diagnosis not present

## 2018-06-21 DIAGNOSIS — E876 Hypokalemia: Secondary | ICD-10-CM | POA: Diagnosis not present

## 2018-06-21 DIAGNOSIS — N186 End stage renal disease: Secondary | ICD-10-CM | POA: Diagnosis not present

## 2018-06-21 DIAGNOSIS — E1129 Type 2 diabetes mellitus with other diabetic kidney complication: Secondary | ICD-10-CM | POA: Diagnosis not present

## 2018-06-21 DIAGNOSIS — Z23 Encounter for immunization: Secondary | ICD-10-CM | POA: Diagnosis not present

## 2018-06-21 DIAGNOSIS — N2581 Secondary hyperparathyroidism of renal origin: Secondary | ICD-10-CM | POA: Diagnosis not present

## 2018-06-21 DIAGNOSIS — Z452 Encounter for adjustment and management of vascular access device: Secondary | ICD-10-CM | POA: Diagnosis not present

## 2018-06-22 DIAGNOSIS — E1122 Type 2 diabetes mellitus with diabetic chronic kidney disease: Secondary | ICD-10-CM | POA: Diagnosis not present

## 2018-06-22 DIAGNOSIS — Z992 Dependence on renal dialysis: Secondary | ICD-10-CM | POA: Diagnosis not present

## 2018-06-22 DIAGNOSIS — N186 End stage renal disease: Secondary | ICD-10-CM | POA: Diagnosis not present

## 2018-06-24 ENCOUNTER — Ambulatory Visit (INDEPENDENT_AMBULATORY_CARE_PROVIDER_SITE_OTHER)
Admission: RE | Admit: 2018-06-24 | Discharge: 2018-06-24 | Disposition: A | Discharge status: Home / Self Care | Payer: Medicare Other | Source: Ambulatory Visit | Attending: Surgery | Admitting: Surgery

## 2018-06-24 ENCOUNTER — Other Ambulatory Visit: Payer: Self-pay

## 2018-06-24 ENCOUNTER — Encounter: Payer: Self-pay | Admitting: *Deleted

## 2018-06-24 ENCOUNTER — Encounter: Payer: Self-pay | Admitting: Surgery

## 2018-06-24 ENCOUNTER — Other Ambulatory Visit: Payer: Self-pay | Admitting: *Deleted

## 2018-06-24 ENCOUNTER — Ambulatory Visit (HOSPITAL_COMMUNITY)
Admission: RE | Admit: 2018-06-24 | Discharge: 2018-06-24 | Disposition: A | Discharge status: Home / Self Care | Payer: Medicare Other | Source: Ambulatory Visit | Attending: Surgery | Admitting: Surgery

## 2018-06-24 ENCOUNTER — Ambulatory Visit (INDEPENDENT_AMBULATORY_CARE_PROVIDER_SITE_OTHER): Payer: Medicare Other | Admitting: Surgery

## 2018-06-24 VITALS — BP 139/64 | HR 57 | Temp 97.8°F | Resp 16 | Ht 72.0 in | Wt 188.6 lb

## 2018-06-24 DIAGNOSIS — D631 Anemia in chronic kidney disease: Secondary | ICD-10-CM | POA: Diagnosis not present

## 2018-06-24 DIAGNOSIS — Z23 Encounter for immunization: Secondary | ICD-10-CM | POA: Diagnosis not present

## 2018-06-24 DIAGNOSIS — N185 Chronic kidney disease, stage 5: Secondary | ICD-10-CM | POA: Insufficient documentation

## 2018-06-24 DIAGNOSIS — N186 End stage renal disease: Secondary | ICD-10-CM | POA: Diagnosis not present

## 2018-06-24 DIAGNOSIS — Z992 Dependence on renal dialysis: Secondary | ICD-10-CM

## 2018-06-24 DIAGNOSIS — N2581 Secondary hyperparathyroidism of renal origin: Secondary | ICD-10-CM | POA: Diagnosis not present

## 2018-06-24 DIAGNOSIS — E1129 Type 2 diabetes mellitus with other diabetic kidney complication: Secondary | ICD-10-CM | POA: Diagnosis not present

## 2018-06-24 DIAGNOSIS — D509 Iron deficiency anemia, unspecified: Secondary | ICD-10-CM | POA: Diagnosis not present

## 2018-06-24 DIAGNOSIS — Z452 Encounter for adjustment and management of vascular access device: Secondary | ICD-10-CM | POA: Diagnosis not present

## 2018-06-24 DIAGNOSIS — D689 Coagulation defect, unspecified: Secondary | ICD-10-CM | POA: Diagnosis not present

## 2018-06-24 NOTE — H&P (View-Only) (Signed)
[facility] of [place]  Patient name: James Chan MRN: [id number] DOB: 1950/10/12 Sex: male   REQUESTING PROVIDER:    Dr. Hollie Salk    REASON FOR CONSULT:    Renal insufficiency  HISTORY OF PRESENT ILLNESS:   James Chan is a 68 y.o. male, who is referred today for dialysis access.  He is left handed.  His renal failure is secondary to HTN and diabetes  The patient has a history of peripheral vascular disease with buttock and calf claudication which is being managed at [facility].  He has a history of carotid stenosis and has undergone bilateral carotid stenting.  These were done for TIAs.  He has undergone multiple coronary intervention  PAST MEDICAL HISTORY    Past Medical History:  Diagnosis Date  . Anemia   . Anxiety state, unspecified 09/15/2008   Qualifier: Diagnosis of  By: Bullins CMA, Ami    . Arterial insufficiency of lower extremity ([place]) 05/10/2016  . Arthritis   . BACK PAIN, LUMBAR, CHRONIC 09/15/2008   Qualifier: Diagnosis of  By: Bullins CMA, Ami    . CHF (congestive heart failure) ([place])   . Chronic kidney disease   . Claudication ([place]) 10/30/2014  . Comprehensive diabetic foot examination, type 2 DM, encounter for [facility]) 09/15/2008   Qualifier: Diagnosis of  By: Bullins CMA, Ami    . DEPRESSIVE DISORDER 09/15/2008   Qualifier: Diagnosis of  By: Bullins CMA, Ami    . Diabetes mellitus without complication ([place])   . Diabetic polyneuropathy associated with diabetes mellitus due to underlying condition ([place]) 06/28/2015  . Dyslipidemia 11/24/2015  . Esophageal reflux 09/15/2008   Qualifier: Diagnosis of  By: Bullins CMA, Ami    . Glaucoma   . Gout   . Hammer toes of both feet 06/28/2015  . History of carotid artery stenosis 10/30/2014  . 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 Hyperlipidemia   . Hypertension   . Hypertensive heart failure ([place]) 11/28/2016  . HYPERTRIGLYCERIDEMIA 09/15/2008   Qualifier: Diagnosis of  By: Bullins CMA, Ami    . Myocardial infarction ([place])   . NSTEMI (non-ST elevated myocardial infarction) ([place]) 09/15/2008   Qualifier: Diagnosis of  By: Bullins CMA, Ami    . Onychomycosis due to dermatophyte 06/28/2015  . PAD (peripheral artery disease) ([place]) 10/30/2014  . Stroke [facility])      FAMILY HISTORY   Family History  Problem Relation Age of Onset  . Heart disease Mother   . Cancer Mother   . Heart disease Father   . Cancer Father     SOCIAL HISTORY:   Social History   Socioeconomic History  . Marital status: Married    Spouse name: Not on file  . Number of children: Not on file  . Years of education: Not on file  . Highest education level: Not on file  Occupational History  . Not on file  Social Needs  . Financial resource strain: Not on file  . Food insecurity:    Worry: Not on file    Inability: Not on file  . Transportation needs:    Medical: Not on file    Non-medical: Not on file  Tobacco Use  . Smoking status: Former Smoker    Last attempt to quit: 05/09/1994    Years since quitting: 24.1  . Smokeless tobacco: Never Used  Substance and Sexual Activity  . Alcohol use: No  . Drug use: No  . Sexual activity: Not on  file  Lifestyle  . Physical activity:    Days per week: Not on file    Minutes per session: Not on file  . Stress: Not on file  Relationships  . Social connections:    Talks on phone: Not on file    Gets together: Not on file    Attends religious service: Not on file    Active member of club or organization: Not on file    Attends meetings of clubs or organizations: Not on file    Relationship status: Not on file  . Intimate partner violence:    Fear of current or ex partner: Not on file    Emotionally abused: Not on file    Physically abused: Not on file    Forced sexual activity: Not on file  Other Topics Concern  . Not on file  Social History Narrative  . Not on file    ALLERGIES:    No Known  Allergies  CURRENT MEDICATIONS:    Current Outpatient Medications  Medication Sig Dispense Refill  . allopurinol (ZYLOPRIM) 100 MG tablet Take 100 mg by mouth daily.    Marland Kitchen amiodarone (PACERONE) 200 MG tablet Take 1 tablet (200 mg total) by mouth daily. 90 tablet 0  . atorvastatin (LIPITOR) 40 MG tablet Take 40 mg by mouth daily.    . cloNIDine (CATAPRES) 0.1 MG tablet Take 1 tablet (0.1 mg) in the morning if SBP (top BP number) > 160 and take 1 tablet (0.1 mg) at bedtime. 180 tablet 0  . clopidogrel (PLAVIX) 75 MG tablet Take 75 mg by mouth daily.    . hydrALAZINE (APRESOLINE) 25 MG tablet Take 1 tablet (25 mg) by mouth twice daily except for dialysis days only take 1 tablet in the evening.    . insulin detemir (LEVEMIR) 100 UNIT/ML injection Inject 30 Units into the skin as directed. Sliding scale    . latanoprost (XALATAN) 0.005 % ophthalmic solution Place 1 drop into both eyes at bedtime.    . metoprolol tartrate (LOPRESSOR) 25 MG tablet TK 1 T PO BID. HOLD IF HEART RATE IS BELOW 55.    . nitroGLYCERIN (NITROSTAT) 0.4 MG SL tablet Place 1 tablet (0.4 mg total) under the tongue every 5 (five) minutes as needed for chest pain. 25 tablet 3  . oxyCODONE-acetaminophen (PERCOCET/ROXICET) 5-325 MG tablet Take 1 tablet by mouth every 4 (four) hours as needed for moderate pain or severe pain.     Marland Kitchen PROAIR HFA 108 (90 Base) MCG/ACT inhaler Inhale 2 puffs into the lungs every 6 (six) hours as needed.  12  . sildenafil (VIAGRA) 25 MG tablet Take 1 tablet (25 mg total) daily as needed by mouth for erectile dysfunction. 10 tablet 1   No current facility-administered medications for this visit.     REVIEW OF SYSTEMS:   [X]  denotes positive finding, [ ]  denotes negative finding Cardiac  Comments:  Chest pain or chest pressure:    Shortness of breath upon exertion: x   Short of breath when lying flat: x   Irregular heart rhythm: x       Vascular    Pain in calf, thigh, or hip brought on by  ambulation: x   Pain in feet at night that wakes you up from your sleep:  x   Blood clot in your veins:    Leg swelling:         Pulmonary    Oxygen at home:    Productive cough:  x   Wheezing:         Neurologic    Sudden weakness in arms or legs:     Sudden numbness in arms or legs:     Sudden onset of difficulty speaking or slurred speech:    Temporary loss of vision in one eye:     Problems with dizziness:  x       Gastrointestinal    Blood in stool:      Vomited blood:         Genitourinary    Burning when urinating:     Blood in urine:        Psychiatric    Major depression:         Hematologic    Bleeding problems:    Problems with blood clotting too easily:        Skin    Rashes or ulcers:        Constitutional    Fever or chills:     PHYSICAL EXAM:   Vitals:   06/24/18 1348  BP: 139/64  Pulse: (!) 57  Resp: 16  Temp: 97.8 F (36.6 C)  TempSrc: Oral  SpO2: 96%  Weight: 188 lb 9.7 oz (85.6 kg)  Height: 6' (1.829 m)    GENERAL: The patient is a well-nourished male, in no acute distress. The vital signs are documented above. CARDIAC: There is a regular rate and rhythm.  VASCULAR: palp radial pulse bilateral PULMONARY: Nonlabored respirations ABDOMEN: Soft and non-tender with normal pitched bowel sounds.  MUSCULOSKELETAL: There are no major deformities or cyanosis. NEUROLOGIC: No focal weakness or paresthesias are detected. SKIN: There are no ulcers or rashes noted. PSYCHIATRIC: The patient has a normal affect.  STUDIES:   I have reviewed and reviewed his vascular lab studies with the following findings: +-----------------+-------------+----------+---------+ Right Cephalic   Diameter (cm)Depth (cm)Findings  +-----------------+-------------+----------+---------+ Shoulder             0.19                         +-----------------+-------------+----------+---------+ Prox upper arm       0.30               branching  +-----------------+-------------+----------+---------+ Mid upper arm        0.30                         +-----------------+-------------+----------+---------+ Dist upper arm       0.34                         +-----------------+-------------+----------+---------+ Antecubital fossa 0.24 / .29                      +-----------------+-------------+----------+---------+ Prox forearm         0.23                         +-----------------+-------------+----------+---------+ Mid forearm       0.26 / .23            branching +-----------------+-------------+----------+---------+ Dist forearm         0.26                         +-----------------+-------------+----------+---------+ Wrist                0.29                         +-----------------+-------------+----------+---------+  +-----------------+-------------+----------+--------------+  Right Basilic    Diameter (cm)Depth (cm)   Findings    +-----------------+-------------+----------+--------------+ Shoulder                                not visualized +-----------------+-------------+----------+--------------+ Prox upper arm                          not visualized +-----------------+-------------+----------+--------------+ Mid upper arm        0.50               mid to dst ua  +-----------------+-------------+----------+--------------+ Dist upper arm       0.32                 branching    +-----------------+-------------+----------+--------------+ Antecubital fossa 0.34 / .24              branching    +-----------------+-------------+----------+--------------+ Prox forearm         0.15                              +-----------------+-------------+----------+--------------+  +-----------------+-------------+----------+---------+ Left Cephalic    Diameter (cm)Depth (cm)Findings  +-----------------+-------------+----------+---------+ Shoulder              0.16                         +-----------------+-------------+----------+---------+ Prox upper arm       0.11                         +-----------------+-------------+----------+---------+ Mid upper arm        0.08                         +-----------------+-------------+----------+---------+ Dist upper arm    0.13 / .20                      +-----------------+-------------+----------+---------+ Antecubital fossa    0.47                crosses  +-----------------+-------------+----------+---------+ Prox forearm         0.38               branching +-----------------+-------------+----------+---------+ Mid forearm          0.24                         +-----------------+-------------+----------+---------+ Dist forearm         0.23                         +-----------------+-------------+----------+---------+  +-----------------+----------------+----------+------------------------+ Left Basilic      Diameter (cm)  Depth (cm)        Findings         +-----------------+----------------+----------+------------------------+ Prox upper arm                             not visualized and joins +-----------------+----------------+----------+------------------------+ Mid upper arm          0.55                         joins           +-----------------+----------------+----------+------------------------+  Dist upper arm         0.52                                         +-----------------+----------------+----------+------------------------+ Antecubital fossa0.34 / .38 / .19                                   +-----------------+----------------+----------+------------------------+ Prox forearm           0.27                                         +-----------------+----------------+----------+------------------------+   ASSESSMENT and PLAN   ESRD:  We discussed proceeding with a staged rIGHT BVT.  His basilic vein goes  into the deep system early.  We discussed the risks and benefits including the need for a second surgery and the risk of steal.  His operation is scheduled for March 12.  He will stop his plavix.   [name], MD [facility] Specialists of [facility] [phone] Pager [phone]

## 2018-06-24 NOTE — Progress Notes (Signed)
Vascular and Vein Specialist of [place]  Patient name: James Chan MRN: [id number] DOB: [birth year] Sex: male   REQUESTING PROVIDER:    Dr. Hollie Salk    REASON FOR CONSULT:    Renal insufficiency  HISTORY OF PRESENT ILLNESS:   James Chan is a 68 y.o. male, who is referred today for dialysis access.  He is left handed.  His renal failure is secondary to HTN and diabetes  The patient has a history of peripheral vascular disease with buttock and calf claudication which is being managed at [facility].  He has a history of carotid stenosis and has undergone bilateral carotid stenting.  These were done for TIAs.  He has undergone multiple coronary intervention  PAST MEDICAL HISTORY    Past Medical History:  Diagnosis Date  . Anemia   . Anxiety state, unspecified 09/15/2008   Qualifier: Diagnosis of  By: Bullins CMA, Ami    . Arterial insufficiency of lower extremity ([place]) 05/10/2016  . Arthritis   . BACK PAIN, LUMBAR, CHRONIC 09/15/2008   Qualifier: Diagnosis of  By: Bullins CMA, Ami    . CHF (congestive heart failure) ([place])   . Chronic kidney disease   . Claudication ([place]) 10/30/2014  . Comprehensive diabetic foot examination, type 2 DM, encounter for [facility]) 09/15/2008   Qualifier: Diagnosis of  By: Bullins CMA, Ami    . DEPRESSIVE DISORDER 09/15/2008   Qualifier: Diagnosis of  By: Bullins CMA, Ami    . Diabetes mellitus without complication ([place])   . Diabetic polyneuropathy associated with diabetes mellitus due to underlying condition ([place]) 06/28/2015  . Dyslipidemia 11/24/2015  . Esophageal reflux 09/15/2008   Qualifier: Diagnosis of  By: Bullins CMA, Ami    . Glaucoma   . Gout   . Hammer toes of both feet 06/28/2015  . History of carotid artery stenosis 10/30/2014  . 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 Hyperlipidemia   . Hypertension   . Hypertensive heart failure ([place]) 11/28/2016  . HYPERTRIGLYCERIDEMIA 09/15/2008   Qualifier: Diagnosis of  By: Bullins CMA, Ami    . Myocardial infarction ([place])   . NSTEMI (non-ST elevated myocardial infarction) ([place]) 09/15/2008   Qualifier: Diagnosis of  By: Bullins CMA, Ami    . Onychomycosis due to dermatophyte 06/28/2015  . PAD (peripheral artery disease) ([place]) 10/30/2014  . Stroke [facility])      FAMILY HISTORY   Family History  Problem Relation Age of Onset  . Heart disease Mother   . Cancer Mother   . Heart disease Father   . Cancer Father     SOCIAL HISTORY:   Social History   Socioeconomic History  . Marital status: Married    Spouse name: Not on file  . Number of children: Not on file  . Years of education: Not on file  . Highest education level: Not on file  Occupational History  . Not on file  Social Needs  . Financial resource strain: Not on file  . Food insecurity:    Worry: Not on file    Inability: Not on file  . Transportation needs:    Medical: Not on file    Non-medical: Not on file  Tobacco Use  . Smoking status: Former Smoker    Last attempt to quit: 05/09/1994    Years since quitting: 24.1  . Smokeless tobacco: Never Used  Substance and Sexual Activity  . Alcohol use: No  . Drug use: No  . Sexual activity: Not on  file  Lifestyle  . Physical activity:    Days per week: Not on file    Minutes per session: Not on file  . Stress: Not on file  Relationships  . Social connections:    Talks on phone: Not on file    Gets together: Not on file    Attends religious service: Not on file    Active member of club or organization: Not on file    Attends meetings of clubs or organizations: Not on file    Relationship status: Not on file  . Intimate partner violence:    Fear of current or ex partner: Not on file    Emotionally abused: Not on file    Physically abused: Not on file    Forced sexual activity: Not on file  Other Topics Concern  . Not on file  Social History Narrative  . Not on file    ALLERGIES:    No Known  Allergies  CURRENT MEDICATIONS:    Current Outpatient Medications  Medication Sig Dispense Refill  . allopurinol (ZYLOPRIM) 100 MG tablet Take 100 mg by mouth daily.    Marland Kitchen amiodarone (PACERONE) 200 MG tablet Take 1 tablet (200 mg total) by mouth daily. 90 tablet 0  . atorvastatin (LIPITOR) 40 MG tablet Take 40 mg by mouth daily.    . cloNIDine (CATAPRES) 0.1 MG tablet Take 1 tablet (0.1 mg) in the morning if SBP (top BP number) > 160 and take 1 tablet (0.1 mg) at bedtime. 180 tablet 0  . clopidogrel (PLAVIX) 75 MG tablet Take 75 mg by mouth daily.    . hydrALAZINE (APRESOLINE) 25 MG tablet Take 1 tablet (25 mg) by mouth twice daily except for dialysis days only take 1 tablet in the evening.    . insulin detemir (LEVEMIR) 100 UNIT/ML injection Inject 30 Units into the skin as directed. Sliding scale    . latanoprost (XALATAN) 0.005 % ophthalmic solution Place 1 drop into both eyes at bedtime.    . metoprolol tartrate (LOPRESSOR) 25 MG tablet TK 1 T PO BID. HOLD IF HEART RATE IS BELOW 55.    . nitroGLYCERIN (NITROSTAT) 0.4 MG SL tablet Place 1 tablet (0.4 mg total) under the tongue every 5 (five) minutes as needed for chest pain. 25 tablet 3  . oxyCODONE-acetaminophen (PERCOCET/ROXICET) 5-325 MG tablet Take 1 tablet by mouth every 4 (four) hours as needed for moderate pain or severe pain.     Marland Kitchen PROAIR HFA 108 (90 Base) MCG/ACT inhaler Inhale 2 puffs into the lungs every 6 (six) hours as needed.  12  . sildenafil (VIAGRA) 25 MG tablet Take 1 tablet (25 mg total) daily as needed by mouth for erectile dysfunction. 10 tablet 1   No current facility-administered medications for this visit.     REVIEW OF SYSTEMS:   [X]  denotes positive finding, [ ]  denotes negative finding Cardiac  Comments:  Chest pain or chest pressure:    Shortness of breath upon exertion: x   Short of breath when lying flat: x   Irregular heart rhythm: x       Vascular    Pain in calf, thigh, or hip brought on by  ambulation: x   Pain in feet at night that wakes you up from your sleep:  x   Blood clot in your veins:    Leg swelling:         Pulmonary    Oxygen at home:    Productive cough:  x   Wheezing:         Neurologic    Sudden weakness in arms or legs:     Sudden numbness in arms or legs:     Sudden onset of difficulty speaking or slurred speech:    Temporary loss of vision in one eye:     Problems with dizziness:  x       Gastrointestinal    Blood in stool:      Vomited blood:         Genitourinary    Burning when urinating:     Blood in urine:        Psychiatric    Major depression:         Hematologic    Bleeding problems:    Problems with blood clotting too easily:        Skin    Rashes or ulcers:        Constitutional    Fever or chills:     PHYSICAL EXAM:   Vitals:   06/24/18 1348  BP: 139/64  Pulse: (!) 57  Resp: 16  Temp: 97.8 F (36.6 C)  TempSrc: Oral  SpO2: 96%  Weight: 188 lb 9.7 oz (85.6 kg)  Height: 6' (1.829 m)    GENERAL: The patient is a well-nourished male, in no acute distress. The vital signs are documented above. CARDIAC: There is a regular rate and rhythm.  VASCULAR: palp radial pulse bilateral PULMONARY: Nonlabored respirations ABDOMEN: Soft and non-tender with normal pitched bowel sounds.  MUSCULOSKELETAL: There are no major deformities or cyanosis. NEUROLOGIC: No focal weakness or paresthesias are detected. SKIN: There are no ulcers or rashes noted. PSYCHIATRIC: The patient has a normal affect.  STUDIES:   I have reviewed and reviewed his vascular lab studies with the following findings: +-----------------+-------------+----------+---------+ Right Cephalic   Diameter (cm)Depth (cm)Findings  +-----------------+-------------+----------+---------+ Shoulder             0.19                         +-----------------+-------------+----------+---------+ Prox upper arm       0.30                branching +-----------------+-------------+----------+---------+ Mid upper arm        0.30                         +-----------------+-------------+----------+---------+ Dist upper arm       0.34                         +-----------------+-------------+----------+---------+ Antecubital fossa 0.24 / .29                      +-----------------+-------------+----------+---------+ Prox forearm         0.23                         +-----------------+-------------+----------+---------+ Mid forearm       0.26 / .23            branching +-----------------+-------------+----------+---------+ Dist forearm         0.26                         +-----------------+-------------+----------+---------+ Wrist                0.29                         +-----------------+-------------+----------+---------+  +-----------------+-------------+----------+--------------+  Right Basilic    Diameter (cm)Depth (cm)   Findings    +-----------------+-------------+----------+--------------+ Shoulder                                not visualized +-----------------+-------------+----------+--------------+ Prox upper arm                          not visualized +-----------------+-------------+----------+--------------+ Mid upper arm        0.50               mid to dst ua  +-----------------+-------------+----------+--------------+ Dist upper arm       0.32                 branching    +-----------------+-------------+----------+--------------+ Antecubital fossa 0.34 / .24              branching    +-----------------+-------------+----------+--------------+ Prox forearm         0.15                              +-----------------+-------------+----------+--------------+  +-----------------+-------------+----------+---------+ Left Cephalic    Diameter (cm)Depth (cm)Findings   +-----------------+-------------+----------+---------+ Shoulder             0.16                         +-----------------+-------------+----------+---------+ Prox upper arm       0.11                         +-----------------+-------------+----------+---------+ Mid upper arm        0.08                         +-----------------+-------------+----------+---------+ Dist upper arm    0.13 / .20                      +-----------------+-------------+----------+---------+ Antecubital fossa    0.47                crosses  +-----------------+-------------+----------+---------+ Prox forearm         0.38               branching +-----------------+-------------+----------+---------+ Mid forearm          0.24                         +-----------------+-------------+----------+---------+ Dist forearm         0.23                         +-----------------+-------------+----------+---------+  +-----------------+----------------+----------+------------------------+ Left Basilic      Diameter (cm)  Depth (cm)        Findings         +-----------------+----------------+----------+------------------------+ Prox upper arm                             not visualized and joins +-----------------+----------------+----------+------------------------+ Mid upper arm          0.55                         joins           +-----------------+----------------+----------+------------------------+  Dist upper arm         0.52                                         +-----------------+----------------+----------+------------------------+ Antecubital fossa0.34 / .38 / .19                                   +-----------------+----------------+----------+------------------------+ Prox forearm           0.27                                         +-----------------+----------------+----------+------------------------+   ASSESSMENT and PLAN    ESRD:  We discussed proceeding with a staged rIGHT BVT.  His basilic vein goes into the deep system early.  We discussed the risks and benefits including the need for a second surgery and the risk of steal.  His operation is scheduled for March 12.  He will stop his plavix.   [name], MD [facility] Special[facility] of [facility] [phone] Pager [phone]

## 2018-06-25 DIAGNOSIS — E46 Unspecified protein-calorie malnutrition: Secondary | ICD-10-CM

## 2018-06-25 HISTORY — DX: Unspecified protein-calorie malnutrition: E46

## 2018-06-26 DIAGNOSIS — N2581 Secondary hyperparathyroidism of renal origin: Secondary | ICD-10-CM | POA: Diagnosis not present

## 2018-06-26 DIAGNOSIS — N186 End stage renal disease: Secondary | ICD-10-CM | POA: Diagnosis not present

## 2018-06-26 DIAGNOSIS — Z23 Encounter for immunization: Secondary | ICD-10-CM | POA: Diagnosis not present

## 2018-06-26 DIAGNOSIS — D631 Anemia in chronic kidney disease: Secondary | ICD-10-CM | POA: Diagnosis not present

## 2018-06-26 DIAGNOSIS — D509 Iron deficiency anemia, unspecified: Secondary | ICD-10-CM | POA: Diagnosis not present

## 2018-06-26 DIAGNOSIS — Z452 Encounter for adjustment and management of vascular access device: Secondary | ICD-10-CM | POA: Diagnosis not present

## 2018-06-26 DIAGNOSIS — D689 Coagulation defect, unspecified: Secondary | ICD-10-CM | POA: Diagnosis not present

## 2018-06-26 DIAGNOSIS — E1129 Type 2 diabetes mellitus with other diabetic kidney complication: Secondary | ICD-10-CM | POA: Diagnosis not present

## 2018-06-27 DIAGNOSIS — G4700 Insomnia, unspecified: Secondary | ICD-10-CM | POA: Diagnosis not present

## 2018-06-27 DIAGNOSIS — I10 Essential (primary) hypertension: Secondary | ICD-10-CM | POA: Diagnosis not present

## 2018-06-27 DIAGNOSIS — D509 Iron deficiency anemia, unspecified: Secondary | ICD-10-CM | POA: Diagnosis not present

## 2018-06-27 DIAGNOSIS — E1140 Type 2 diabetes mellitus with diabetic neuropathy, unspecified: Secondary | ICD-10-CM | POA: Diagnosis not present

## 2018-06-28 DIAGNOSIS — D689 Coagulation defect, unspecified: Secondary | ICD-10-CM | POA: Diagnosis not present

## 2018-06-28 DIAGNOSIS — N2581 Secondary hyperparathyroidism of renal origin: Secondary | ICD-10-CM | POA: Diagnosis not present

## 2018-06-28 DIAGNOSIS — E1129 Type 2 diabetes mellitus with other diabetic kidney complication: Secondary | ICD-10-CM | POA: Diagnosis not present

## 2018-06-28 DIAGNOSIS — D631 Anemia in chronic kidney disease: Secondary | ICD-10-CM | POA: Diagnosis not present

## 2018-06-28 DIAGNOSIS — D509 Iron deficiency anemia, unspecified: Secondary | ICD-10-CM | POA: Diagnosis not present

## 2018-06-28 DIAGNOSIS — Z23 Encounter for immunization: Secondary | ICD-10-CM | POA: Diagnosis not present

## 2018-06-28 DIAGNOSIS — Z452 Encounter for adjustment and management of vascular access device: Secondary | ICD-10-CM | POA: Diagnosis not present

## 2018-06-28 DIAGNOSIS — I2510 Atherosclerotic heart disease of native coronary artery without angina pectoris: Secondary | ICD-10-CM

## 2018-06-28 DIAGNOSIS — N186 End stage renal disease: Secondary | ICD-10-CM | POA: Diagnosis not present

## 2018-06-28 HISTORY — DX: Atherosclerotic heart disease of native coronary artery without angina pectoris: I25.10

## 2018-07-01 DIAGNOSIS — D631 Anemia in chronic kidney disease: Secondary | ICD-10-CM | POA: Diagnosis not present

## 2018-07-01 DIAGNOSIS — N2581 Secondary hyperparathyroidism of renal origin: Secondary | ICD-10-CM | POA: Diagnosis not present

## 2018-07-01 DIAGNOSIS — D509 Iron deficiency anemia, unspecified: Secondary | ICD-10-CM | POA: Diagnosis not present

## 2018-07-01 DIAGNOSIS — E1129 Type 2 diabetes mellitus with other diabetic kidney complication: Secondary | ICD-10-CM | POA: Diagnosis not present

## 2018-07-01 DIAGNOSIS — Z23 Encounter for immunization: Secondary | ICD-10-CM | POA: Diagnosis not present

## 2018-07-01 DIAGNOSIS — Z452 Encounter for adjustment and management of vascular access device: Secondary | ICD-10-CM | POA: Diagnosis not present

## 2018-07-01 DIAGNOSIS — D689 Coagulation defect, unspecified: Secondary | ICD-10-CM | POA: Diagnosis not present

## 2018-07-01 DIAGNOSIS — N186 End stage renal disease: Secondary | ICD-10-CM | POA: Diagnosis not present

## 2018-07-02 ENCOUNTER — Encounter (HOSPITAL_COMMUNITY): Payer: Self-pay | Admitting: *Deleted

## 2018-07-02 ENCOUNTER — Other Ambulatory Visit: Payer: Self-pay

## 2018-07-02 DIAGNOSIS — H401131 Primary open-angle glaucoma, bilateral, mild stage: Secondary | ICD-10-CM | POA: Diagnosis not present

## 2018-07-02 DIAGNOSIS — E113293 Type 2 diabetes mellitus with mild nonproliferative diabetic retinopathy without macular edema, bilateral: Secondary | ICD-10-CM | POA: Diagnosis not present

## 2018-07-02 DIAGNOSIS — Z794 Long term (current) use of insulin: Secondary | ICD-10-CM | POA: Diagnosis not present

## 2018-07-02 NOTE — Progress Notes (Addendum)
Patient denies chest pain, reports an episode of shbo about 3 to 4 weeks ago, states he notified dialysis. Reports that Dr. Steward Ros is his cardiologist. Given the below instructions regarding DM management.   Per patient Dr. Trula Slade stopped ASA and Plavix on Saturday.   o Check your blood sugar the morning of your surgery when you wake up and every 2 hours until you get to the Short Stay unit. . If your blood sugar is less than 70 mg/dL, you will need to treat for low blood sugar: o Do not take insulin. o Treat a low blood sugar (less than 70 mg/dL) with  cup of clear juice (cranberry or apple), 4 glucose tablets, OR glucose gel. Recheck blood sugar in 15 minutes after treatment (to make sure it is greater than 70 mg/dL). If your blood sugar is not greater than 70 mg/dL on recheck, call [phone] o  for further instructions. . Report your blood sugar to the short stay nurse when you get to Short Stay.  . If you are admitted to the hospital after surgery: o Your blood sugar will be checked by the staff and you will probably be given insulin after surgery (instead of oral diabetes medicines) to make sure you have good blood sugar levels. o The goal for blood sugar control after surgery is 80-180 mg/dL.    . THE NIGHT BEFORE SURGERY, take ____17_______ units of _Levemir__________insulin.       . THE MORNING OF SURGERY, take ____17_________ units of _Levermir_________insulin.  . The day of surgery, do not take other diabetes injectables, including Byetta (exenatide), Bydureon (exenatide ER), Victoza (liraglutide), or Trulicity (dulaglutide).  . If your CBG is greater than 220 mg/dL, you may take  of your sliding scale (correction) dose of insulin.

## 2018-07-03 DIAGNOSIS — N2581 Secondary hyperparathyroidism of renal origin: Secondary | ICD-10-CM | POA: Diagnosis not present

## 2018-07-03 DIAGNOSIS — E1129 Type 2 diabetes mellitus with other diabetic kidney complication: Secondary | ICD-10-CM | POA: Diagnosis not present

## 2018-07-03 DIAGNOSIS — N186 End stage renal disease: Secondary | ICD-10-CM | POA: Diagnosis not present

## 2018-07-03 DIAGNOSIS — Z23 Encounter for immunization: Secondary | ICD-10-CM | POA: Diagnosis not present

## 2018-07-03 DIAGNOSIS — Z452 Encounter for adjustment and management of vascular access device: Secondary | ICD-10-CM | POA: Diagnosis not present

## 2018-07-03 DIAGNOSIS — D509 Iron deficiency anemia, unspecified: Secondary | ICD-10-CM | POA: Diagnosis not present

## 2018-07-03 DIAGNOSIS — D631 Anemia in chronic kidney disease: Secondary | ICD-10-CM | POA: Diagnosis not present

## 2018-07-03 DIAGNOSIS — D689 Coagulation defect, unspecified: Secondary | ICD-10-CM | POA: Diagnosis not present

## 2018-07-04 ENCOUNTER — Ambulatory Visit (HOSPITAL_COMMUNITY): Payer: Medicare Other | Admitting: Anesthesiology

## 2018-07-04 ENCOUNTER — Ambulatory Visit (HOSPITAL_COMMUNITY)
Admission: RE | Admit: 2018-07-04 | Discharge: 2018-07-04 | Disposition: A | Discharge status: Home / Self Care | Payer: Medicare Other | Attending: Surgery | Admitting: Surgery

## 2018-07-04 ENCOUNTER — Encounter (HOSPITAL_COMMUNITY): Payer: Self-pay

## 2018-07-04 ENCOUNTER — Other Ambulatory Visit: Payer: Self-pay

## 2018-07-04 ENCOUNTER — Encounter (HOSPITAL_COMMUNITY)
Admission: RE | Disposition: A | Discharge status: Home / Self Care | Payer: Self-pay | Source: Home / Self Care | Attending: Surgery

## 2018-07-04 DIAGNOSIS — F419 Anxiety disorder, unspecified: Secondary | ICD-10-CM | POA: Diagnosis not present

## 2018-07-04 DIAGNOSIS — H409 Unspecified glaucoma: Secondary | ICD-10-CM | POA: Diagnosis not present

## 2018-07-04 DIAGNOSIS — I252 Old myocardial infarction: Secondary | ICD-10-CM | POA: Insufficient documentation

## 2018-07-04 DIAGNOSIS — Z8673 Personal history of transient ischemic attack (TIA), and cerebral infarction without residual deficits: Secondary | ICD-10-CM | POA: Insufficient documentation

## 2018-07-04 DIAGNOSIS — N186 End stage renal disease: Secondary | ICD-10-CM | POA: Insufficient documentation

## 2018-07-04 DIAGNOSIS — M545 Low back pain: Secondary | ICD-10-CM | POA: Insufficient documentation

## 2018-07-04 DIAGNOSIS — I509 Heart failure, unspecified: Secondary | ICD-10-CM | POA: Diagnosis not present

## 2018-07-04 DIAGNOSIS — I132 Hypertensive heart and chronic kidney disease with heart failure and with stage 5 chronic kidney disease, or end stage renal disease: Secondary | ICD-10-CM | POA: Diagnosis not present

## 2018-07-04 DIAGNOSIS — Z7902 Long term (current) use of antithrombotics/antiplatelets: Secondary | ICD-10-CM | POA: Insufficient documentation

## 2018-07-04 DIAGNOSIS — F329 Major depressive disorder, single episode, unspecified: Secondary | ICD-10-CM | POA: Insufficient documentation

## 2018-07-04 DIAGNOSIS — E1122 Type 2 diabetes mellitus with diabetic chronic kidney disease: Secondary | ICD-10-CM | POA: Diagnosis not present

## 2018-07-04 DIAGNOSIS — Z7951 Long term (current) use of inhaled steroids: Secondary | ICD-10-CM | POA: Insufficient documentation

## 2018-07-04 DIAGNOSIS — E785 Hyperlipidemia, unspecified: Secondary | ICD-10-CM | POA: Diagnosis not present

## 2018-07-04 DIAGNOSIS — M1990 Unspecified osteoarthritis, unspecified site: Secondary | ICD-10-CM | POA: Diagnosis not present

## 2018-07-04 DIAGNOSIS — K219 Gastro-esophageal reflux disease without esophagitis: Secondary | ICD-10-CM | POA: Diagnosis not present

## 2018-07-04 DIAGNOSIS — E1151 Type 2 diabetes mellitus with diabetic peripheral angiopathy without gangrene: Secondary | ICD-10-CM | POA: Insufficient documentation

## 2018-07-04 DIAGNOSIS — M109 Gout, unspecified: Secondary | ICD-10-CM | POA: Diagnosis not present

## 2018-07-04 DIAGNOSIS — N185 Chronic kidney disease, stage 5: Secondary | ICD-10-CM | POA: Diagnosis not present

## 2018-07-04 DIAGNOSIS — Z809 Family history of malignant neoplasm, unspecified: Secondary | ICD-10-CM | POA: Diagnosis not present

## 2018-07-04 DIAGNOSIS — Z8249 Family history of ischemic heart disease and other diseases of the circulatory system: Secondary | ICD-10-CM | POA: Diagnosis not present

## 2018-07-04 DIAGNOSIS — Z87891 Personal history of nicotine dependence: Secondary | ICD-10-CM | POA: Insufficient documentation

## 2018-07-04 DIAGNOSIS — G8929 Other chronic pain: Secondary | ICD-10-CM | POA: Insufficient documentation

## 2018-07-04 DIAGNOSIS — Z794 Long term (current) use of insulin: Secondary | ICD-10-CM | POA: Insufficient documentation

## 2018-07-04 DIAGNOSIS — E1142 Type 2 diabetes mellitus with diabetic polyneuropathy: Secondary | ICD-10-CM | POA: Insufficient documentation

## 2018-07-04 DIAGNOSIS — Z992 Dependence on renal dialysis: Secondary | ICD-10-CM | POA: Diagnosis not present

## 2018-07-04 DIAGNOSIS — I714 Abdominal aortic aneurysm, without rupture: Secondary | ICD-10-CM | POA: Diagnosis not present

## 2018-07-04 DIAGNOSIS — Z79899 Other long term (current) drug therapy: Secondary | ICD-10-CM | POA: Insufficient documentation

## 2018-07-04 HISTORY — DX: End stage renal disease: Z99.2

## 2018-07-04 HISTORY — PX: AV FISTULA PLACEMENT: SHX1204

## 2018-07-04 HISTORY — DX: End stage renal disease: N18.6

## 2018-07-04 HISTORY — DX: Sleep apnea, unspecified: G47.30

## 2018-07-04 HISTORY — DX: Dyspnea, unspecified: R06.00

## 2018-07-04 LAB — POCT I-STAT 4, (NA,K, GLUC, HGB,HCT)
Glucose, Bld: 79 mg/dL (ref 70–99)
HEMATOCRIT: 35 % — AB (ref 39.0–52.0)
HEMOGLOBIN: 11.9 g/dL — AB (ref 13.0–17.0)
Potassium: 4.2 mmol/L (ref 3.5–5.1)
Sodium: 138 mmol/L (ref 135–145)

## 2018-07-04 LAB — GLUCOSE, CAPILLARY
Glucose-Capillary: 76 mg/dL (ref 70–99)
Glucose-Capillary: 90 mg/dL (ref 70–99)

## 2018-07-04 SURGERY — ARTERIOVENOUS (AV) FISTULA CREATION
Anesthesia: General | Site: Arm Lower | Laterality: Right

## 2018-07-04 MED ORDER — FENTANYL CITRATE (PF) 250 MCG/5ML IJ SOLN
INTRAMUSCULAR | Status: DC | PRN
  Administered 2018-07-04 (×3): 25 ug via INTRAVENOUS

## 2018-07-04 MED ORDER — PROPOFOL 10 MG/ML IV BOLUS
INTRAVENOUS | Status: AC
  Filled 2018-07-04: qty 20

## 2018-07-04 MED ORDER — SODIUM CHLORIDE 0.9 % IV SOLN
INTRAVENOUS | Status: DC | PRN
  Administered 2018-07-04: 07:00:00

## 2018-07-04 MED ORDER — CHLORHEXIDINE GLUCONATE 4 % EX LIQD: 60.0000 mL | Freq: Once | CUTANEOUS | Status: DC

## 2018-07-04 MED ORDER — MIDAZOLAM HCL 2 MG/2ML IJ SOLN
INTRAMUSCULAR | Status: DC | PRN
  Administered 2018-07-04: 2 mg via INTRAVENOUS

## 2018-07-04 MED ORDER — PROPOFOL 10 MG/ML IV BOLUS
INTRAVENOUS | Status: DC | PRN
  Administered 2018-07-04: 160 mg via INTRAVENOUS

## 2018-07-04 MED ORDER — OXYCODONE HCL 5 MG PO TABS: 5.0000 mg | ORAL_TABLET | Freq: Three times a day (TID) | ORAL | 0 refills | Status: DC | PRN

## 2018-07-04 MED ORDER — CEFAZOLIN SODIUM-DEXTROSE 2-4 GM/100ML-% IV SOLN
2.0000 g | INTRAVENOUS | Status: AC
  Administered 2018-07-04: 2 g via INTRAVENOUS
  Filled 2018-07-04: qty 100

## 2018-07-04 MED ORDER — HEMOSTATIC AGENTS (NO CHARGE) OPTIME
TOPICAL | Status: DC | PRN
  Administered 2018-07-04: 1 via TOPICAL

## 2018-07-04 MED ORDER — FENTANYL CITRATE (PF) 250 MCG/5ML IJ SOLN
INTRAMUSCULAR | Status: AC
  Filled 2018-07-04: qty 5

## 2018-07-04 MED ORDER — LIDOCAINE 2% (20 MG/ML) 5 ML SYRINGE
INTRAMUSCULAR | Status: AC
  Filled 2018-07-04: qty 5

## 2018-07-04 MED ORDER — LIDOCAINE 2% (20 MG/ML) 5 ML SYRINGE
INTRAMUSCULAR | Status: DC | PRN
  Administered 2018-07-04: 60 mg via INTRAVENOUS

## 2018-07-04 MED ORDER — DEXAMETHASONE SODIUM PHOSPHATE 10 MG/ML IJ SOLN
INTRAMUSCULAR | Status: DC | PRN
  Administered 2018-07-04: 5 mg via INTRAVENOUS

## 2018-07-04 MED ORDER — MIDAZOLAM HCL 2 MG/2ML IJ SOLN
INTRAMUSCULAR | Status: AC
  Filled 2018-07-04: qty 2

## 2018-07-04 MED ORDER — DEXAMETHASONE SODIUM PHOSPHATE 10 MG/ML IJ SOLN
INTRAMUSCULAR | Status: AC
  Filled 2018-07-04: qty 1

## 2018-07-04 MED ORDER — SODIUM CHLORIDE 0.9 % IV SOLN
INTRAVENOUS | Status: AC
  Filled 2018-07-04: qty 1.2

## 2018-07-04 MED ORDER — SODIUM CHLORIDE 0.9 % IV SOLN: INTRAVENOUS | Status: DC

## 2018-07-04 MED ORDER — ONDANSETRON HCL 4 MG/2ML IJ SOLN
INTRAMUSCULAR | Status: AC
  Filled 2018-07-04: qty 2

## 2018-07-04 MED ORDER — SODIUM CHLORIDE 0.9 % IV SOLN
INTRAVENOUS | Status: DC | PRN
  Administered 2018-07-04: 07:00:00 via INTRAVENOUS

## 2018-07-04 MED ORDER — SODIUM CHLORIDE 0.9 % IV SOLN
INTRAVENOUS | Status: DC | PRN
  Administered 2018-07-04: 20 ug/min via INTRAVENOUS

## 2018-07-04 MED ORDER — EPHEDRINE 5 MG/ML INJ
INTRAVENOUS | Status: AC
  Filled 2018-07-04: qty 10

## 2018-07-04 MED ORDER — 0.9 % SODIUM CHLORIDE (POUR BTL) OPTIME
TOPICAL | Status: DC | PRN
  Administered 2018-07-04: 1000 mL

## 2018-07-04 MED ORDER — ONDANSETRON HCL 4 MG/2ML IJ SOLN
INTRAMUSCULAR | Status: DC | PRN
  Administered 2018-07-04: 4 mg via INTRAVENOUS

## 2018-07-04 MED ORDER — EPHEDRINE SULFATE-NACL 50-0.9 MG/10ML-% IV SOSY
PREFILLED_SYRINGE | INTRAVENOUS | Status: DC | PRN
  Administered 2018-07-04: 10 mg via INTRAVENOUS
  Administered 2018-07-04: 5 mg via INTRAVENOUS

## 2018-07-04 SURGICAL SUPPLY — 38 items
ADH SKN CLS APL DERMABOND .7 (GAUZE/BANDAGES/DRESSINGS) ×1
ARMBAND PINK RESTRICT EXTREMIT (MISCELLANEOUS) ×6 IMPLANT
CANISTER SUCT 3000ML PPV (MISCELLANEOUS) ×3 IMPLANT
CLIP VESOCCLUDE MED 6/CT (CLIP) ×3 IMPLANT
CLIP VESOCCLUDE SM WIDE 6/CT (CLIP) ×3 IMPLANT
COVER PROBE W GEL 5X96 (DRAPES) ×3 IMPLANT
COVER WAND RF STERILE (DRAPES) ×3 IMPLANT
DERMABOND ADVANCED (GAUZE/BANDAGES/DRESSINGS) ×2
DERMABOND ADVANCED .7 DNX12 (GAUZE/BANDAGES/DRESSINGS) ×1 IMPLANT
ELECT REM PT RETURN 9FT ADLT (ELECTROSURGICAL) ×3
ELECTRODE REM PT RTRN 9FT ADLT (ELECTROSURGICAL) ×1 IMPLANT
GLOVE BIO SURGEON STRL SZ 6.5 (GLOVE) ×2 IMPLANT
GLOVE BIO SURGEON STRL SZ7 (GLOVE) ×4 IMPLANT
GLOVE BIO SURGEONS STRL SZ 6.5 (GLOVE) ×2
GLOVE BIOGEL PI IND STRL 6.5 (GLOVE) IMPLANT
GLOVE BIOGEL PI IND STRL 7.0 (GLOVE) IMPLANT
GLOVE BIOGEL PI IND STRL 7.5 (GLOVE) ×1 IMPLANT
GLOVE BIOGEL PI INDICATOR 6.5 (GLOVE) ×2
GLOVE BIOGEL PI INDICATOR 7.0 (GLOVE) ×2
GLOVE BIOGEL PI INDICATOR 7.5 (GLOVE) ×2
GLOVE SURG SS PI 7.5 STRL IVOR (GLOVE) ×3 IMPLANT
GOWN STRL REUS W/ TWL LRG LVL3 (GOWN DISPOSABLE) ×2 IMPLANT
GOWN STRL REUS W/ TWL XL LVL3 (GOWN DISPOSABLE) ×1 IMPLANT
GOWN STRL REUS W/TWL LRG LVL3 (GOWN DISPOSABLE) ×15
GOWN STRL REUS W/TWL XL LVL3 (GOWN DISPOSABLE) ×3
HEMOSTAT SNOW SURGICEL 2X4 (HEMOSTASIS) ×2 IMPLANT
KIT BASIN OR (CUSTOM PROCEDURE TRAY) ×3 IMPLANT
KIT TURNOVER KIT B (KITS) ×3 IMPLANT
NS IRRIG 1000ML POUR BTL (IV SOLUTION) ×3 IMPLANT
PACK CV ACCESS (CUSTOM PROCEDURE TRAY) ×3 IMPLANT
PAD ARMBOARD 7.5X6 YLW CONV (MISCELLANEOUS) ×6 IMPLANT
SUT PROLENE 6 0 CC (SUTURE) ×3 IMPLANT
SUT VIC AB 3-0 SH 27 (SUTURE) ×3
SUT VIC AB 3-0 SH 27X BRD (SUTURE) ×1 IMPLANT
SUT VICRYL 4-0 PS2 18IN ABS (SUTURE) ×3 IMPLANT
TOWEL GREEN STERILE (TOWEL DISPOSABLE) ×3 IMPLANT
UNDERPAD 30X30 (UNDERPADS AND DIAPERS) ×3 IMPLANT
WATER STERILE IRR 1000ML POUR (IV SOLUTION) ×3 IMPLANT

## 2018-07-04 NOTE — Transfer of Care (Signed)
Immediate Anesthesia Transfer of Care Note  Patient: James Chan  Procedure(s) Performed: CREATION BASILIC VEIN ARTERIOVENOUS FISTULA RIGHT ARM (Right Arm Lower)  Patient Location: PACU  Anesthesia Type:General  Level of Consciousness: awake, alert  and oriented  Airway & Oxygen Therapy: Patient Spontanous Breathing  Post-op Assessment: Report given to RN, Post -op Vital signs reviewed and stable and Patient moving all extremities X 4  Post vital signs: Reviewed and stable  Last Vitals:  Vitals Value Taken Time  BP    Temp    Pulse    Resp    SpO2      Last Pain:  Vitals:   07/04/18 0855  TempSrc:   PainSc: 0-No pain      Patients Stated Pain Goal: 3 (31/74/09 9278)  Complications: No apparent anesthesia complications

## 2018-07-04 NOTE — Anesthesia Procedure Notes (Signed)
Procedure Name: LMA Insertion Date/Time: 07/04/2018 7:46 AM Performed by: Harden Mo, CRNA Pre-anesthesia Checklist: Patient identified, Emergency Drugs available, Suction available and Patient being monitored Patient Re-evaluated:Patient Re-evaluated prior to induction Oxygen Delivery Method: Circle System Utilized Preoxygenation: Pre-oxygenation with 100% oxygen Induction Type: IV induction LMA: LMA inserted LMA Size: 5.0 Number of attempts: 1 Airway Equipment and Method: Bite block Placement Confirmation: positive ETCO2 Tube secured with: Tape Dental Injury: Teeth and Oropharynx as per pre-operative assessment

## 2018-07-04 NOTE — Interval H&P Note (Signed)
History and Physical Interval Note:  07/04/2018 7:24 AM  James Chan  has presented today for surgery, with the diagnosis of END STAGE RENAL DISEASE FOR HEMODIALYSIS ACCESS.  The various methods of treatment have been discussed with the patient and family. After consideration of risks, benefits and other options for treatment, the patient has consented to  Procedure(s): CREATION BASILIC VEIN ARTERIOVENOUS FISTULA RIGHT ARM (Right) as a surgical intervention.  The patient's history has been reviewed, patient examined, no change in status, stable for surgery.  I have reviewed the patient's chart and labs.  Questions were answered to the patient's satisfaction.     [name]

## 2018-07-04 NOTE — Anesthesia Preprocedure Evaluation (Addendum)
Anesthesia Evaluation  Patient identified by MRN, date of birth, ID band Patient awake    Reviewed: Allergy & Precautions, H&P , NPO status , Patient's Chart, lab work & pertinent test results  Airway Mallampati: II  TM Distance: >3 FB Neck ROM: full    Dental  (+) Edentulous Upper, Edentulous Lower, Dental Advisory Given   Pulmonary shortness of breath, sleep apnea , former smoker,    breath sounds clear to auscultation       Cardiovascular hypertension, + CAD, + Past MI, + Peripheral Vascular Disease and +CHF   Rhythm:regular Rate:Normal     Neuro/Psych PSYCHIATRIC DISORDERS Anxiety Depression  Neuromuscular disease CVA    GI/Hepatic GERD  ,  Endo/Other  diabetes, Type 2  Renal/GU ESRF and DialysisRenal disease     Musculoskeletal  (+) Arthritis ,   Abdominal   Peds  Hematology   Anesthesia Other Findings   Reproductive/Obstetrics                            Anesthesia Physical Anesthesia Plan  ASA: III  Anesthesia Plan: General   Post-op Pain Management:    Induction: Intravenous  PONV Risk Score and Plan: 2 and Ondansetron, Dexamethasone, Midazolam and Treatment may vary due to age or medical condition  Airway Management Planned: LMA  Additional Equipment:   Intra-op Plan:   Post-operative Plan:   Informed Consent: I have reviewed the patients History and Physical, chart, labs and discussed the procedure including the risks, benefits and alternatives for the proposed anesthesia with the patient or authorized representative who has indicated his/her understanding and acceptance.       Plan Discussed with: CRNA, Anesthesiologist and Surgeon  Anesthesia Plan Comments:         Anesthesia Quick Evaluation

## 2018-07-04 NOTE — Op Note (Signed)
    Patient name: James Chan MRN: [id number] DOB: 31-Jul-1950 Sex: male  07/04/2018 Pre-operative Diagnosis: ESRD Post-operative diagnosis:  Same Surgeon:  [name] Assistants:  Delena Serve Procedure:   Right 1st stage basilic vein fistula Anesthesia:  general Blood Loss:  minimal Specimens:  none  Findings:  65mm basilic vein, 3 mm artery  Indications: The patient comes in today for fistula creation.  Risks and benefits were discussed at the preoperative visit.  Procedure:  The patient was identified in the holding area and taken to [place] 16  The patient was then placed supine on the table. general anesthesia was administered.  The patient was prepped and draped in the usual sterile fashion.  A time out was called and antibiotics were administered.  Ultrasound was used to evaluate the superficial veins in the arm.  The basilic vein was adequate for fistula creation.  An oblique incision was made at the antecubital crease.  I first dissected out the brachial artery which was a disease-free 3.5 mm artery.  It was fully mobilized and encircled with Vesseloops.  I then dissected out the basilic vein.  I ligated the medial branch.  The vein was marked for orientation and then ligated distally.  It distended nicely.  There was approximately a 4 mm vein.  I then occluded the brachial artery with vascular clamps.  A #11 blade was used to make an arteriotomy which extended longitudinally with Potts scissors.  The vein was cut the appropriate length and spatulated to fit the size the arteriotomy.  A running anastomosis was created with 6-0 Prolene.  Prior to completion the appropriate flushing maneuvers were performed and the anastomosis was completed.  The patient had brisk Doppler signals in the radial and ulnar artery as well as a palpable thrill within the fistula.  Hemostasis was achieved.  The wound was irrigated.  The incision was closed with 2 layers of 3-0 Vicryl followed by  Dermabond.  There were no immediate complications.   Disposition: To PACU stable.   Theotis Burrow, M.D. [facility] [place] Office: [phone] Pager:  [phone]

## 2018-07-05 ENCOUNTER — Telehealth: Payer: Self-pay | Admitting: Surgery

## 2018-07-05 ENCOUNTER — Encounter (HOSPITAL_COMMUNITY): Payer: Self-pay | Admitting: Surgery

## 2018-07-05 DIAGNOSIS — D631 Anemia in chronic kidney disease: Secondary | ICD-10-CM | POA: Diagnosis not present

## 2018-07-05 DIAGNOSIS — D509 Iron deficiency anemia, unspecified: Secondary | ICD-10-CM | POA: Diagnosis not present

## 2018-07-05 DIAGNOSIS — Z452 Encounter for adjustment and management of vascular access device: Secondary | ICD-10-CM | POA: Diagnosis not present

## 2018-07-05 DIAGNOSIS — N186 End stage renal disease: Secondary | ICD-10-CM | POA: Diagnosis not present

## 2018-07-05 DIAGNOSIS — Z23 Encounter for immunization: Secondary | ICD-10-CM | POA: Diagnosis not present

## 2018-07-05 DIAGNOSIS — N2581 Secondary hyperparathyroidism of renal origin: Secondary | ICD-10-CM | POA: Diagnosis not present

## 2018-07-05 DIAGNOSIS — E1129 Type 2 diabetes mellitus with other diabetic kidney complication: Secondary | ICD-10-CM | POA: Diagnosis not present

## 2018-07-05 DIAGNOSIS — D689 Coagulation defect, unspecified: Secondary | ICD-10-CM | POA: Diagnosis not present

## 2018-07-05 NOTE — Telephone Encounter (Signed)
sch appt spk to pt mld ltr 08/14/2018 1pm Dialysis duplex 2pm p/o PA

## 2018-07-05 NOTE — Telephone Encounter (Signed)
-----   Message from Mena Goes, RN sent at 07/05/2018  9:03 AM EDT ----- Regarding: charge  ----- Message ----- From: [name], MD Sent: 07/04/2018   9:02 PM EDT To: [facility] Charge Pool  07/04/2018:  Surgeon:  [name] Assistants:  Delena Serve Procedure:   Right 1st stage basilic vein fistula  Follow-up 4-6 weeks with duplex of fistula to see me or PA

## 2018-07-05 NOTE — Anesthesia Postprocedure Evaluation (Signed)
Anesthesia Post Note  Patient: James Chan  Procedure(s) Performed: CREATION BASILIC VEIN ARTERIOVENOUS FISTULA RIGHT ARM (Right Arm Lower)     Patient location during evaluation: PACU Anesthesia Type: General Level of consciousness: awake and alert Pain management: pain level controlled Vital Signs Assessment: post-procedure vital signs reviewed and stable Respiratory status: spontaneous breathing, nonlabored ventilation, respiratory function stable and patient connected to nasal cannula oxygen Cardiovascular status: blood pressure returned to baseline and stable Postop Assessment: no apparent nausea or vomiting Anesthetic complications: no    Last Vitals:  Vitals:   07/04/18 0601 07/04/18 0855  BP: (!) 167/63 (!) 138/55  Pulse: (!) 56 (!) 54  Resp: 18 16  Temp: 36.6 C 36.7 C  SpO2: 100% 100%    Last Pain:  Vitals:   07/04/18 0855  TempSrc:   PainSc: 0-No pain                 Latosha Gaylord S

## 2018-07-08 DIAGNOSIS — Z23 Encounter for immunization: Secondary | ICD-10-CM | POA: Diagnosis not present

## 2018-07-08 DIAGNOSIS — D509 Iron deficiency anemia, unspecified: Secondary | ICD-10-CM | POA: Diagnosis not present

## 2018-07-08 DIAGNOSIS — N2581 Secondary hyperparathyroidism of renal origin: Secondary | ICD-10-CM | POA: Diagnosis not present

## 2018-07-08 DIAGNOSIS — N186 End stage renal disease: Secondary | ICD-10-CM | POA: Diagnosis not present

## 2018-07-08 DIAGNOSIS — Z452 Encounter for adjustment and management of vascular access device: Secondary | ICD-10-CM | POA: Diagnosis not present

## 2018-07-08 DIAGNOSIS — E1129 Type 2 diabetes mellitus with other diabetic kidney complication: Secondary | ICD-10-CM | POA: Diagnosis not present

## 2018-07-08 DIAGNOSIS — D689 Coagulation defect, unspecified: Secondary | ICD-10-CM | POA: Diagnosis not present

## 2018-07-08 DIAGNOSIS — D631 Anemia in chronic kidney disease: Secondary | ICD-10-CM | POA: Diagnosis not present

## 2018-07-10 DIAGNOSIS — Z23 Encounter for immunization: Secondary | ICD-10-CM | POA: Diagnosis not present

## 2018-07-10 DIAGNOSIS — N2581 Secondary hyperparathyroidism of renal origin: Secondary | ICD-10-CM | POA: Diagnosis not present

## 2018-07-10 DIAGNOSIS — E1129 Type 2 diabetes mellitus with other diabetic kidney complication: Secondary | ICD-10-CM | POA: Diagnosis not present

## 2018-07-10 DIAGNOSIS — D509 Iron deficiency anemia, unspecified: Secondary | ICD-10-CM | POA: Diagnosis not present

## 2018-07-10 DIAGNOSIS — N186 End stage renal disease: Secondary | ICD-10-CM | POA: Diagnosis not present

## 2018-07-10 DIAGNOSIS — D689 Coagulation defect, unspecified: Secondary | ICD-10-CM | POA: Diagnosis not present

## 2018-07-10 DIAGNOSIS — Z452 Encounter for adjustment and management of vascular access device: Secondary | ICD-10-CM | POA: Diagnosis not present

## 2018-07-10 DIAGNOSIS — D631 Anemia in chronic kidney disease: Secondary | ICD-10-CM | POA: Diagnosis not present

## 2018-07-11 ENCOUNTER — Other Ambulatory Visit: Payer: Self-pay | Admitting: Surgery

## 2018-07-11 DIAGNOSIS — K219 Gastro-esophageal reflux disease without esophagitis: Secondary | ICD-10-CM | POA: Diagnosis not present

## 2018-07-11 DIAGNOSIS — G4700 Insomnia, unspecified: Secondary | ICD-10-CM | POA: Diagnosis not present

## 2018-07-11 DIAGNOSIS — D509 Iron deficiency anemia, unspecified: Secondary | ICD-10-CM | POA: Diagnosis not present

## 2018-07-11 DIAGNOSIS — I10 Essential (primary) hypertension: Secondary | ICD-10-CM | POA: Diagnosis not present

## 2018-07-11 DIAGNOSIS — E1140 Type 2 diabetes mellitus with diabetic neuropathy, unspecified: Secondary | ICD-10-CM | POA: Diagnosis not present

## 2018-07-12 DIAGNOSIS — D631 Anemia in chronic kidney disease: Secondary | ICD-10-CM | POA: Diagnosis not present

## 2018-07-12 DIAGNOSIS — Z23 Encounter for immunization: Secondary | ICD-10-CM | POA: Diagnosis not present

## 2018-07-12 DIAGNOSIS — E1129 Type 2 diabetes mellitus with other diabetic kidney complication: Secondary | ICD-10-CM | POA: Diagnosis not present

## 2018-07-12 DIAGNOSIS — D689 Coagulation defect, unspecified: Secondary | ICD-10-CM | POA: Diagnosis not present

## 2018-07-12 DIAGNOSIS — Z452 Encounter for adjustment and management of vascular access device: Secondary | ICD-10-CM | POA: Diagnosis not present

## 2018-07-12 DIAGNOSIS — D509 Iron deficiency anemia, unspecified: Secondary | ICD-10-CM | POA: Diagnosis not present

## 2018-07-12 DIAGNOSIS — N186 End stage renal disease: Secondary | ICD-10-CM | POA: Diagnosis not present

## 2018-07-12 DIAGNOSIS — N2581 Secondary hyperparathyroidism of renal origin: Secondary | ICD-10-CM | POA: Diagnosis not present

## 2018-07-15 DIAGNOSIS — D631 Anemia in chronic kidney disease: Secondary | ICD-10-CM | POA: Diagnosis not present

## 2018-07-15 DIAGNOSIS — Z452 Encounter for adjustment and management of vascular access device: Secondary | ICD-10-CM | POA: Diagnosis not present

## 2018-07-15 DIAGNOSIS — N2581 Secondary hyperparathyroidism of renal origin: Secondary | ICD-10-CM | POA: Diagnosis not present

## 2018-07-15 DIAGNOSIS — D689 Coagulation defect, unspecified: Secondary | ICD-10-CM | POA: Diagnosis not present

## 2018-07-15 DIAGNOSIS — D509 Iron deficiency anemia, unspecified: Secondary | ICD-10-CM | POA: Diagnosis not present

## 2018-07-15 DIAGNOSIS — E1129 Type 2 diabetes mellitus with other diabetic kidney complication: Secondary | ICD-10-CM | POA: Diagnosis not present

## 2018-07-15 DIAGNOSIS — N186 End stage renal disease: Secondary | ICD-10-CM | POA: Diagnosis not present

## 2018-07-15 DIAGNOSIS — Z23 Encounter for immunization: Secondary | ICD-10-CM | POA: Diagnosis not present

## 2018-07-17 DIAGNOSIS — Z23 Encounter for immunization: Secondary | ICD-10-CM | POA: Diagnosis not present

## 2018-07-17 DIAGNOSIS — D631 Anemia in chronic kidney disease: Secondary | ICD-10-CM | POA: Diagnosis not present

## 2018-07-17 DIAGNOSIS — E1129 Type 2 diabetes mellitus with other diabetic kidney complication: Secondary | ICD-10-CM | POA: Diagnosis not present

## 2018-07-17 DIAGNOSIS — N186 End stage renal disease: Secondary | ICD-10-CM | POA: Diagnosis not present

## 2018-07-17 DIAGNOSIS — Z452 Encounter for adjustment and management of vascular access device: Secondary | ICD-10-CM | POA: Diagnosis not present

## 2018-07-17 DIAGNOSIS — D509 Iron deficiency anemia, unspecified: Secondary | ICD-10-CM | POA: Diagnosis not present

## 2018-07-17 DIAGNOSIS — D689 Coagulation defect, unspecified: Secondary | ICD-10-CM | POA: Diagnosis not present

## 2018-07-17 DIAGNOSIS — N2581 Secondary hyperparathyroidism of renal origin: Secondary | ICD-10-CM | POA: Diagnosis not present

## 2018-07-19 DIAGNOSIS — Z23 Encounter for immunization: Secondary | ICD-10-CM | POA: Diagnosis not present

## 2018-07-19 DIAGNOSIS — D689 Coagulation defect, unspecified: Secondary | ICD-10-CM | POA: Diagnosis not present

## 2018-07-19 DIAGNOSIS — D631 Anemia in chronic kidney disease: Secondary | ICD-10-CM | POA: Diagnosis not present

## 2018-07-19 DIAGNOSIS — D509 Iron deficiency anemia, unspecified: Secondary | ICD-10-CM | POA: Diagnosis not present

## 2018-07-19 DIAGNOSIS — N186 End stage renal disease: Secondary | ICD-10-CM | POA: Diagnosis not present

## 2018-07-19 DIAGNOSIS — E1129 Type 2 diabetes mellitus with other diabetic kidney complication: Secondary | ICD-10-CM | POA: Diagnosis not present

## 2018-07-19 DIAGNOSIS — Z452 Encounter for adjustment and management of vascular access device: Secondary | ICD-10-CM | POA: Diagnosis not present

## 2018-07-19 DIAGNOSIS — N2581 Secondary hyperparathyroidism of renal origin: Secondary | ICD-10-CM | POA: Diagnosis not present

## 2018-07-22 DIAGNOSIS — D509 Iron deficiency anemia, unspecified: Secondary | ICD-10-CM | POA: Diagnosis not present

## 2018-07-22 DIAGNOSIS — D689 Coagulation defect, unspecified: Secondary | ICD-10-CM | POA: Diagnosis not present

## 2018-07-22 DIAGNOSIS — D631 Anemia in chronic kidney disease: Secondary | ICD-10-CM | POA: Diagnosis not present

## 2018-07-22 DIAGNOSIS — Z452 Encounter for adjustment and management of vascular access device: Secondary | ICD-10-CM | POA: Diagnosis not present

## 2018-07-22 DIAGNOSIS — N2581 Secondary hyperparathyroidism of renal origin: Secondary | ICD-10-CM | POA: Diagnosis not present

## 2018-07-22 DIAGNOSIS — E1129 Type 2 diabetes mellitus with other diabetic kidney complication: Secondary | ICD-10-CM | POA: Diagnosis not present

## 2018-07-22 DIAGNOSIS — N186 End stage renal disease: Secondary | ICD-10-CM | POA: Diagnosis not present

## 2018-07-22 DIAGNOSIS — Z23 Encounter for immunization: Secondary | ICD-10-CM | POA: Diagnosis not present

## 2018-07-23 DIAGNOSIS — E1122 Type 2 diabetes mellitus with diabetic chronic kidney disease: Secondary | ICD-10-CM | POA: Diagnosis not present

## 2018-07-23 DIAGNOSIS — N186 End stage renal disease: Secondary | ICD-10-CM | POA: Diagnosis not present

## 2018-07-23 DIAGNOSIS — Z992 Dependence on renal dialysis: Secondary | ICD-10-CM | POA: Diagnosis not present

## 2018-07-24 DIAGNOSIS — N2581 Secondary hyperparathyroidism of renal origin: Secondary | ICD-10-CM | POA: Diagnosis not present

## 2018-07-24 DIAGNOSIS — N186 End stage renal disease: Secondary | ICD-10-CM | POA: Diagnosis not present

## 2018-07-24 DIAGNOSIS — Z452 Encounter for adjustment and management of vascular access device: Secondary | ICD-10-CM | POA: Diagnosis not present

## 2018-07-24 DIAGNOSIS — D509 Iron deficiency anemia, unspecified: Secondary | ICD-10-CM | POA: Diagnosis not present

## 2018-07-24 DIAGNOSIS — E1129 Type 2 diabetes mellitus with other diabetic kidney complication: Secondary | ICD-10-CM | POA: Diagnosis not present

## 2018-07-24 DIAGNOSIS — D631 Anemia in chronic kidney disease: Secondary | ICD-10-CM | POA: Diagnosis not present

## 2018-07-24 DIAGNOSIS — D689 Coagulation defect, unspecified: Secondary | ICD-10-CM | POA: Diagnosis not present

## 2018-07-25 DIAGNOSIS — K219 Gastro-esophageal reflux disease without esophagitis: Secondary | ICD-10-CM | POA: Diagnosis not present

## 2018-07-25 DIAGNOSIS — E1140 Type 2 diabetes mellitus with diabetic neuropathy, unspecified: Secondary | ICD-10-CM | POA: Diagnosis not present

## 2018-07-25 DIAGNOSIS — G4700 Insomnia, unspecified: Secondary | ICD-10-CM | POA: Diagnosis not present

## 2018-07-25 DIAGNOSIS — I10 Essential (primary) hypertension: Secondary | ICD-10-CM | POA: Diagnosis not present

## 2018-07-25 DIAGNOSIS — D509 Iron deficiency anemia, unspecified: Secondary | ICD-10-CM | POA: Diagnosis not present

## 2018-07-26 DIAGNOSIS — N2581 Secondary hyperparathyroidism of renal origin: Secondary | ICD-10-CM | POA: Diagnosis not present

## 2018-07-26 DIAGNOSIS — D631 Anemia in chronic kidney disease: Secondary | ICD-10-CM | POA: Diagnosis not present

## 2018-07-26 DIAGNOSIS — D689 Coagulation defect, unspecified: Secondary | ICD-10-CM | POA: Diagnosis not present

## 2018-07-26 DIAGNOSIS — E1129 Type 2 diabetes mellitus with other diabetic kidney complication: Secondary | ICD-10-CM | POA: Diagnosis not present

## 2018-07-26 DIAGNOSIS — D509 Iron deficiency anemia, unspecified: Secondary | ICD-10-CM | POA: Diagnosis not present

## 2018-07-26 DIAGNOSIS — Z452 Encounter for adjustment and management of vascular access device: Secondary | ICD-10-CM | POA: Diagnosis not present

## 2018-07-26 DIAGNOSIS — N186 End stage renal disease: Secondary | ICD-10-CM | POA: Diagnosis not present

## 2018-07-29 DIAGNOSIS — D509 Iron deficiency anemia, unspecified: Secondary | ICD-10-CM | POA: Diagnosis not present

## 2018-07-29 DIAGNOSIS — N186 End stage renal disease: Secondary | ICD-10-CM | POA: Diagnosis not present

## 2018-07-29 DIAGNOSIS — Z452 Encounter for adjustment and management of vascular access device: Secondary | ICD-10-CM | POA: Diagnosis not present

## 2018-07-29 DIAGNOSIS — E1129 Type 2 diabetes mellitus with other diabetic kidney complication: Secondary | ICD-10-CM | POA: Diagnosis not present

## 2018-07-29 DIAGNOSIS — N2581 Secondary hyperparathyroidism of renal origin: Secondary | ICD-10-CM | POA: Diagnosis not present

## 2018-07-29 DIAGNOSIS — D689 Coagulation defect, unspecified: Secondary | ICD-10-CM | POA: Diagnosis not present

## 2018-07-29 DIAGNOSIS — D631 Anemia in chronic kidney disease: Secondary | ICD-10-CM | POA: Diagnosis not present

## 2018-07-31 ENCOUNTER — Telehealth: Payer: Self-pay | Admitting: Cardiology

## 2018-07-31 ENCOUNTER — Other Ambulatory Visit: Payer: Self-pay

## 2018-07-31 DIAGNOSIS — N186 End stage renal disease: Secondary | ICD-10-CM | POA: Diagnosis not present

## 2018-07-31 DIAGNOSIS — E1129 Type 2 diabetes mellitus with other diabetic kidney complication: Secondary | ICD-10-CM | POA: Diagnosis not present

## 2018-07-31 DIAGNOSIS — N2581 Secondary hyperparathyroidism of renal origin: Secondary | ICD-10-CM | POA: Diagnosis not present

## 2018-07-31 DIAGNOSIS — D689 Coagulation defect, unspecified: Secondary | ICD-10-CM | POA: Diagnosis not present

## 2018-07-31 DIAGNOSIS — Z452 Encounter for adjustment and management of vascular access device: Secondary | ICD-10-CM | POA: Diagnosis not present

## 2018-07-31 DIAGNOSIS — D631 Anemia in chronic kidney disease: Secondary | ICD-10-CM | POA: Diagnosis not present

## 2018-07-31 DIAGNOSIS — D509 Iron deficiency anemia, unspecified: Secondary | ICD-10-CM | POA: Diagnosis not present

## 2018-07-31 DIAGNOSIS — Z992 Dependence on renal dialysis: Secondary | ICD-10-CM

## 2018-07-31 NOTE — Telephone Encounter (Signed)
Patient states that since starting dialysis on 06/25/18, his BP has been elevated and difficult to control despite taking meds as ordered by Dr. Bettina Gavia and despite hemodialysis practitioners making adjustments to optimize.  Current medications reviewed. He is taking amiodarone 200 mg daily. Clonidine was stopped by hemodialysis practitioner. Hydralazine was increased from 25 mg once daily on hemodialysis days and  twice daily on non- hemodialysis days to 25 mg twice daily on hemosialysis days and 25 mg three times daily on non-hemodialysis days. Metoprolol 25 mg twice daily is ordered, however his heart rate decreases to below 50 if he takes the second dose. Therefore, he can only tolerate 25 mg metoprolol daily.  With this regimen, his systolic blood pressures in the morning run between 165-182. In the evening systolic blood pressures run from 145-150 and this is with hydralazine 25 mg three times daily. He denies shortness of breath. He does experience dizziness when his BP is elevated and he has experienced increased sensations of his heart fluttering over the last month.    pls advise, tx

## 2018-07-31 NOTE — Telephone Encounter (Signed)
Patient started dialysis - BP has been running high - today 172/77; yesterday 171/77.  Patient wants to know what he needs to do. Please patient call to discuss

## 2018-07-31 NOTE — Telephone Encounter (Signed)
(  con't) "informed that Dr. Bettina Gavia"...feels its best for hemodialysis practitioners to manage his BP since they see him more frequently and otherwise it becomes confusing. Patient states he will talk to providers about this at next dialysis and get back to our office.

## 2018-07-31 NOTE — Telephone Encounter (Signed)
Patient informed that Dr. Bettina Gavia

## 2018-07-31 NOTE — Telephone Encounter (Signed)
I think it is best that once he started hemodialysis and is seen frequently by the group that they manage his hypertension otherwise it just becomes very confusing who is in charge.

## 2018-08-02 DIAGNOSIS — D689 Coagulation defect, unspecified: Secondary | ICD-10-CM | POA: Diagnosis not present

## 2018-08-02 DIAGNOSIS — N186 End stage renal disease: Secondary | ICD-10-CM | POA: Diagnosis not present

## 2018-08-02 DIAGNOSIS — D509 Iron deficiency anemia, unspecified: Secondary | ICD-10-CM | POA: Diagnosis not present

## 2018-08-02 DIAGNOSIS — E1129 Type 2 diabetes mellitus with other diabetic kidney complication: Secondary | ICD-10-CM | POA: Diagnosis not present

## 2018-08-02 DIAGNOSIS — D631 Anemia in chronic kidney disease: Secondary | ICD-10-CM | POA: Diagnosis not present

## 2018-08-02 DIAGNOSIS — N2581 Secondary hyperparathyroidism of renal origin: Secondary | ICD-10-CM | POA: Diagnosis not present

## 2018-08-02 DIAGNOSIS — Z452 Encounter for adjustment and management of vascular access device: Secondary | ICD-10-CM | POA: Diagnosis not present

## 2018-08-05 DIAGNOSIS — D631 Anemia in chronic kidney disease: Secondary | ICD-10-CM | POA: Diagnosis not present

## 2018-08-05 DIAGNOSIS — D509 Iron deficiency anemia, unspecified: Secondary | ICD-10-CM | POA: Diagnosis not present

## 2018-08-05 DIAGNOSIS — Z452 Encounter for adjustment and management of vascular access device: Secondary | ICD-10-CM | POA: Diagnosis not present

## 2018-08-05 DIAGNOSIS — D689 Coagulation defect, unspecified: Secondary | ICD-10-CM | POA: Diagnosis not present

## 2018-08-05 DIAGNOSIS — E1129 Type 2 diabetes mellitus with other diabetic kidney complication: Secondary | ICD-10-CM | POA: Diagnosis not present

## 2018-08-05 DIAGNOSIS — N2581 Secondary hyperparathyroidism of renal origin: Secondary | ICD-10-CM | POA: Diagnosis not present

## 2018-08-05 DIAGNOSIS — N186 End stage renal disease: Secondary | ICD-10-CM | POA: Diagnosis not present

## 2018-08-07 DIAGNOSIS — N186 End stage renal disease: Secondary | ICD-10-CM | POA: Diagnosis not present

## 2018-08-07 DIAGNOSIS — N2581 Secondary hyperparathyroidism of renal origin: Secondary | ICD-10-CM | POA: Diagnosis not present

## 2018-08-07 DIAGNOSIS — D631 Anemia in chronic kidney disease: Secondary | ICD-10-CM | POA: Diagnosis not present

## 2018-08-07 DIAGNOSIS — D689 Coagulation defect, unspecified: Secondary | ICD-10-CM | POA: Diagnosis not present

## 2018-08-07 DIAGNOSIS — Z452 Encounter for adjustment and management of vascular access device: Secondary | ICD-10-CM | POA: Diagnosis not present

## 2018-08-07 DIAGNOSIS — D509 Iron deficiency anemia, unspecified: Secondary | ICD-10-CM | POA: Diagnosis not present

## 2018-08-07 DIAGNOSIS — E1129 Type 2 diabetes mellitus with other diabetic kidney complication: Secondary | ICD-10-CM | POA: Diagnosis not present

## 2018-08-09 DIAGNOSIS — N186 End stage renal disease: Secondary | ICD-10-CM | POA: Diagnosis not present

## 2018-08-09 DIAGNOSIS — D689 Coagulation defect, unspecified: Secondary | ICD-10-CM | POA: Diagnosis not present

## 2018-08-09 DIAGNOSIS — N2581 Secondary hyperparathyroidism of renal origin: Secondary | ICD-10-CM | POA: Diagnosis not present

## 2018-08-09 DIAGNOSIS — D509 Iron deficiency anemia, unspecified: Secondary | ICD-10-CM | POA: Diagnosis not present

## 2018-08-09 DIAGNOSIS — E1129 Type 2 diabetes mellitus with other diabetic kidney complication: Secondary | ICD-10-CM | POA: Diagnosis not present

## 2018-08-09 DIAGNOSIS — D631 Anemia in chronic kidney disease: Secondary | ICD-10-CM | POA: Diagnosis not present

## 2018-08-09 DIAGNOSIS — Z452 Encounter for adjustment and management of vascular access device: Secondary | ICD-10-CM | POA: Diagnosis not present

## 2018-08-12 DIAGNOSIS — E1129 Type 2 diabetes mellitus with other diabetic kidney complication: Secondary | ICD-10-CM | POA: Diagnosis not present

## 2018-08-12 DIAGNOSIS — D631 Anemia in chronic kidney disease: Secondary | ICD-10-CM | POA: Diagnosis not present

## 2018-08-12 DIAGNOSIS — D689 Coagulation defect, unspecified: Secondary | ICD-10-CM | POA: Diagnosis not present

## 2018-08-12 DIAGNOSIS — D509 Iron deficiency anemia, unspecified: Secondary | ICD-10-CM | POA: Diagnosis not present

## 2018-08-12 DIAGNOSIS — N2581 Secondary hyperparathyroidism of renal origin: Secondary | ICD-10-CM | POA: Diagnosis not present

## 2018-08-12 DIAGNOSIS — N186 End stage renal disease: Secondary | ICD-10-CM | POA: Diagnosis not present

## 2018-08-12 DIAGNOSIS — Z452 Encounter for adjustment and management of vascular access device: Secondary | ICD-10-CM | POA: Diagnosis not present

## 2018-08-14 ENCOUNTER — Encounter (HOSPITAL_COMMUNITY): Payer: Medicare Other

## 2018-08-14 DIAGNOSIS — N186 End stage renal disease: Secondary | ICD-10-CM | POA: Diagnosis not present

## 2018-08-14 DIAGNOSIS — E1129 Type 2 diabetes mellitus with other diabetic kidney complication: Secondary | ICD-10-CM | POA: Diagnosis not present

## 2018-08-14 DIAGNOSIS — D509 Iron deficiency anemia, unspecified: Secondary | ICD-10-CM | POA: Diagnosis not present

## 2018-08-14 DIAGNOSIS — N2581 Secondary hyperparathyroidism of renal origin: Secondary | ICD-10-CM | POA: Diagnosis not present

## 2018-08-14 DIAGNOSIS — D631 Anemia in chronic kidney disease: Secondary | ICD-10-CM | POA: Diagnosis not present

## 2018-08-14 DIAGNOSIS — D689 Coagulation defect, unspecified: Secondary | ICD-10-CM | POA: Diagnosis not present

## 2018-08-14 DIAGNOSIS — Z452 Encounter for adjustment and management of vascular access device: Secondary | ICD-10-CM | POA: Diagnosis not present

## 2018-08-15 ENCOUNTER — Encounter (HOSPITAL_COMMUNITY): Payer: Medicare Other

## 2018-08-15 ENCOUNTER — Ambulatory Visit: Payer: Medicare Other | Admitting: Family

## 2018-08-16 DIAGNOSIS — D631 Anemia in chronic kidney disease: Secondary | ICD-10-CM | POA: Diagnosis not present

## 2018-08-16 DIAGNOSIS — Z452 Encounter for adjustment and management of vascular access device: Secondary | ICD-10-CM | POA: Diagnosis not present

## 2018-08-16 DIAGNOSIS — N2581 Secondary hyperparathyroidism of renal origin: Secondary | ICD-10-CM | POA: Diagnosis not present

## 2018-08-16 DIAGNOSIS — D509 Iron deficiency anemia, unspecified: Secondary | ICD-10-CM | POA: Diagnosis not present

## 2018-08-16 DIAGNOSIS — N186 End stage renal disease: Secondary | ICD-10-CM | POA: Diagnosis not present

## 2018-08-16 DIAGNOSIS — E1129 Type 2 diabetes mellitus with other diabetic kidney complication: Secondary | ICD-10-CM | POA: Diagnosis not present

## 2018-08-16 DIAGNOSIS — D689 Coagulation defect, unspecified: Secondary | ICD-10-CM | POA: Diagnosis not present

## 2018-08-19 DIAGNOSIS — Z452 Encounter for adjustment and management of vascular access device: Secondary | ICD-10-CM | POA: Diagnosis not present

## 2018-08-19 DIAGNOSIS — D631 Anemia in chronic kidney disease: Secondary | ICD-10-CM | POA: Diagnosis not present

## 2018-08-19 DIAGNOSIS — N186 End stage renal disease: Secondary | ICD-10-CM | POA: Diagnosis not present

## 2018-08-19 DIAGNOSIS — N2581 Secondary hyperparathyroidism of renal origin: Secondary | ICD-10-CM | POA: Diagnosis not present

## 2018-08-19 DIAGNOSIS — D509 Iron deficiency anemia, unspecified: Secondary | ICD-10-CM | POA: Diagnosis not present

## 2018-08-19 DIAGNOSIS — D689 Coagulation defect, unspecified: Secondary | ICD-10-CM | POA: Diagnosis not present

## 2018-08-19 DIAGNOSIS — E1129 Type 2 diabetes mellitus with other diabetic kidney complication: Secondary | ICD-10-CM | POA: Diagnosis not present

## 2018-08-21 DIAGNOSIS — D631 Anemia in chronic kidney disease: Secondary | ICD-10-CM | POA: Diagnosis not present

## 2018-08-21 DIAGNOSIS — N186 End stage renal disease: Secondary | ICD-10-CM | POA: Diagnosis not present

## 2018-08-21 DIAGNOSIS — D689 Coagulation defect, unspecified: Secondary | ICD-10-CM | POA: Diagnosis not present

## 2018-08-21 DIAGNOSIS — Z452 Encounter for adjustment and management of vascular access device: Secondary | ICD-10-CM | POA: Diagnosis not present

## 2018-08-21 DIAGNOSIS — D509 Iron deficiency anemia, unspecified: Secondary | ICD-10-CM | POA: Diagnosis not present

## 2018-08-21 DIAGNOSIS — N2581 Secondary hyperparathyroidism of renal origin: Secondary | ICD-10-CM | POA: Diagnosis not present

## 2018-08-21 DIAGNOSIS — E1129 Type 2 diabetes mellitus with other diabetic kidney complication: Secondary | ICD-10-CM | POA: Diagnosis not present

## 2018-08-22 DIAGNOSIS — I10 Essential (primary) hypertension: Secondary | ICD-10-CM | POA: Diagnosis not present

## 2018-08-22 DIAGNOSIS — E1122 Type 2 diabetes mellitus with diabetic chronic kidney disease: Secondary | ICD-10-CM | POA: Diagnosis not present

## 2018-08-22 DIAGNOSIS — D509 Iron deficiency anemia, unspecified: Secondary | ICD-10-CM | POA: Diagnosis not present

## 2018-08-22 DIAGNOSIS — N186 End stage renal disease: Secondary | ICD-10-CM | POA: Diagnosis not present

## 2018-08-22 DIAGNOSIS — K219 Gastro-esophageal reflux disease without esophagitis: Secondary | ICD-10-CM | POA: Diagnosis not present

## 2018-08-22 DIAGNOSIS — E1140 Type 2 diabetes mellitus with diabetic neuropathy, unspecified: Secondary | ICD-10-CM | POA: Diagnosis not present

## 2018-08-22 DIAGNOSIS — Z992 Dependence on renal dialysis: Secondary | ICD-10-CM | POA: Diagnosis not present

## 2018-08-22 DIAGNOSIS — G4700 Insomnia, unspecified: Secondary | ICD-10-CM | POA: Diagnosis not present

## 2018-08-23 DIAGNOSIS — E1129 Type 2 diabetes mellitus with other diabetic kidney complication: Secondary | ICD-10-CM | POA: Diagnosis not present

## 2018-08-23 DIAGNOSIS — D509 Iron deficiency anemia, unspecified: Secondary | ICD-10-CM | POA: Diagnosis not present

## 2018-08-23 DIAGNOSIS — Z452 Encounter for adjustment and management of vascular access device: Secondary | ICD-10-CM | POA: Diagnosis not present

## 2018-08-23 DIAGNOSIS — D689 Coagulation defect, unspecified: Secondary | ICD-10-CM | POA: Diagnosis not present

## 2018-08-23 DIAGNOSIS — D631 Anemia in chronic kidney disease: Secondary | ICD-10-CM | POA: Diagnosis not present

## 2018-08-23 DIAGNOSIS — N186 End stage renal disease: Secondary | ICD-10-CM | POA: Diagnosis not present

## 2018-08-23 DIAGNOSIS — N2581 Secondary hyperparathyroidism of renal origin: Secondary | ICD-10-CM | POA: Diagnosis not present

## 2018-08-26 DIAGNOSIS — E1129 Type 2 diabetes mellitus with other diabetic kidney complication: Secondary | ICD-10-CM | POA: Diagnosis not present

## 2018-08-26 DIAGNOSIS — N186 End stage renal disease: Secondary | ICD-10-CM | POA: Diagnosis not present

## 2018-08-26 DIAGNOSIS — N2581 Secondary hyperparathyroidism of renal origin: Secondary | ICD-10-CM | POA: Diagnosis not present

## 2018-08-26 DIAGNOSIS — D509 Iron deficiency anemia, unspecified: Secondary | ICD-10-CM | POA: Diagnosis not present

## 2018-08-26 DIAGNOSIS — D631 Anemia in chronic kidney disease: Secondary | ICD-10-CM | POA: Diagnosis not present

## 2018-08-26 DIAGNOSIS — D689 Coagulation defect, unspecified: Secondary | ICD-10-CM | POA: Diagnosis not present

## 2018-08-26 DIAGNOSIS — Z452 Encounter for adjustment and management of vascular access device: Secondary | ICD-10-CM | POA: Diagnosis not present

## 2018-08-28 DIAGNOSIS — N2581 Secondary hyperparathyroidism of renal origin: Secondary | ICD-10-CM | POA: Diagnosis not present

## 2018-08-28 DIAGNOSIS — N186 End stage renal disease: Secondary | ICD-10-CM | POA: Diagnosis not present

## 2018-08-28 DIAGNOSIS — E1129 Type 2 diabetes mellitus with other diabetic kidney complication: Secondary | ICD-10-CM | POA: Diagnosis not present

## 2018-08-28 DIAGNOSIS — D509 Iron deficiency anemia, unspecified: Secondary | ICD-10-CM | POA: Diagnosis not present

## 2018-08-28 DIAGNOSIS — D689 Coagulation defect, unspecified: Secondary | ICD-10-CM | POA: Diagnosis not present

## 2018-08-28 DIAGNOSIS — D631 Anemia in chronic kidney disease: Secondary | ICD-10-CM | POA: Diagnosis not present

## 2018-08-28 DIAGNOSIS — Z452 Encounter for adjustment and management of vascular access device: Secondary | ICD-10-CM | POA: Diagnosis not present

## 2018-08-30 DIAGNOSIS — N2581 Secondary hyperparathyroidism of renal origin: Secondary | ICD-10-CM | POA: Diagnosis not present

## 2018-08-30 DIAGNOSIS — Z452 Encounter for adjustment and management of vascular access device: Secondary | ICD-10-CM | POA: Diagnosis not present

## 2018-08-30 DIAGNOSIS — N186 End stage renal disease: Secondary | ICD-10-CM | POA: Diagnosis not present

## 2018-08-30 DIAGNOSIS — D689 Coagulation defect, unspecified: Secondary | ICD-10-CM | POA: Diagnosis not present

## 2018-08-30 DIAGNOSIS — D631 Anemia in chronic kidney disease: Secondary | ICD-10-CM | POA: Diagnosis not present

## 2018-08-30 DIAGNOSIS — D509 Iron deficiency anemia, unspecified: Secondary | ICD-10-CM | POA: Diagnosis not present

## 2018-08-30 DIAGNOSIS — E1129 Type 2 diabetes mellitus with other diabetic kidney complication: Secondary | ICD-10-CM | POA: Diagnosis not present

## 2018-09-02 DIAGNOSIS — N186 End stage renal disease: Secondary | ICD-10-CM | POA: Diagnosis not present

## 2018-09-02 DIAGNOSIS — E1129 Type 2 diabetes mellitus with other diabetic kidney complication: Secondary | ICD-10-CM | POA: Diagnosis not present

## 2018-09-02 DIAGNOSIS — Z452 Encounter for adjustment and management of vascular access device: Secondary | ICD-10-CM | POA: Diagnosis not present

## 2018-09-02 DIAGNOSIS — D689 Coagulation defect, unspecified: Secondary | ICD-10-CM | POA: Diagnosis not present

## 2018-09-02 DIAGNOSIS — N2581 Secondary hyperparathyroidism of renal origin: Secondary | ICD-10-CM | POA: Diagnosis not present

## 2018-09-02 DIAGNOSIS — D631 Anemia in chronic kidney disease: Secondary | ICD-10-CM | POA: Diagnosis not present

## 2018-09-02 DIAGNOSIS — D509 Iron deficiency anemia, unspecified: Secondary | ICD-10-CM | POA: Diagnosis not present

## 2018-09-04 DIAGNOSIS — N2581 Secondary hyperparathyroidism of renal origin: Secondary | ICD-10-CM | POA: Diagnosis not present

## 2018-09-04 DIAGNOSIS — D509 Iron deficiency anemia, unspecified: Secondary | ICD-10-CM | POA: Diagnosis not present

## 2018-09-04 DIAGNOSIS — E1129 Type 2 diabetes mellitus with other diabetic kidney complication: Secondary | ICD-10-CM | POA: Diagnosis not present

## 2018-09-04 DIAGNOSIS — D631 Anemia in chronic kidney disease: Secondary | ICD-10-CM | POA: Diagnosis not present

## 2018-09-04 DIAGNOSIS — D689 Coagulation defect, unspecified: Secondary | ICD-10-CM | POA: Diagnosis not present

## 2018-09-04 DIAGNOSIS — Z452 Encounter for adjustment and management of vascular access device: Secondary | ICD-10-CM | POA: Diagnosis not present

## 2018-09-04 DIAGNOSIS — N186 End stage renal disease: Secondary | ICD-10-CM | POA: Diagnosis not present

## 2018-09-06 DIAGNOSIS — N2581 Secondary hyperparathyroidism of renal origin: Secondary | ICD-10-CM | POA: Diagnosis not present

## 2018-09-06 DIAGNOSIS — D689 Coagulation defect, unspecified: Secondary | ICD-10-CM | POA: Diagnosis not present

## 2018-09-06 DIAGNOSIS — N186 End stage renal disease: Secondary | ICD-10-CM | POA: Diagnosis not present

## 2018-09-06 DIAGNOSIS — Z452 Encounter for adjustment and management of vascular access device: Secondary | ICD-10-CM | POA: Diagnosis not present

## 2018-09-06 DIAGNOSIS — D509 Iron deficiency anemia, unspecified: Secondary | ICD-10-CM | POA: Diagnosis not present

## 2018-09-06 DIAGNOSIS — D631 Anemia in chronic kidney disease: Secondary | ICD-10-CM | POA: Diagnosis not present

## 2018-09-06 DIAGNOSIS — E1129 Type 2 diabetes mellitus with other diabetic kidney complication: Secondary | ICD-10-CM | POA: Diagnosis not present

## 2018-09-09 DIAGNOSIS — N2581 Secondary hyperparathyroidism of renal origin: Secondary | ICD-10-CM | POA: Diagnosis not present

## 2018-09-09 DIAGNOSIS — D509 Iron deficiency anemia, unspecified: Secondary | ICD-10-CM | POA: Diagnosis not present

## 2018-09-09 DIAGNOSIS — D631 Anemia in chronic kidney disease: Secondary | ICD-10-CM | POA: Diagnosis not present

## 2018-09-09 DIAGNOSIS — E1129 Type 2 diabetes mellitus with other diabetic kidney complication: Secondary | ICD-10-CM | POA: Diagnosis not present

## 2018-09-09 DIAGNOSIS — N186 End stage renal disease: Secondary | ICD-10-CM | POA: Diagnosis not present

## 2018-09-09 DIAGNOSIS — D689 Coagulation defect, unspecified: Secondary | ICD-10-CM | POA: Diagnosis not present

## 2018-09-09 DIAGNOSIS — Z452 Encounter for adjustment and management of vascular access device: Secondary | ICD-10-CM | POA: Diagnosis not present

## 2018-09-11 DIAGNOSIS — N2581 Secondary hyperparathyroidism of renal origin: Secondary | ICD-10-CM | POA: Diagnosis not present

## 2018-09-11 DIAGNOSIS — N186 End stage renal disease: Secondary | ICD-10-CM | POA: Diagnosis not present

## 2018-09-11 DIAGNOSIS — E1129 Type 2 diabetes mellitus with other diabetic kidney complication: Secondary | ICD-10-CM | POA: Diagnosis not present

## 2018-09-11 DIAGNOSIS — D689 Coagulation defect, unspecified: Secondary | ICD-10-CM | POA: Diagnosis not present

## 2018-09-11 DIAGNOSIS — Z452 Encounter for adjustment and management of vascular access device: Secondary | ICD-10-CM | POA: Diagnosis not present

## 2018-09-11 DIAGNOSIS — D509 Iron deficiency anemia, unspecified: Secondary | ICD-10-CM | POA: Diagnosis not present

## 2018-09-11 DIAGNOSIS — D631 Anemia in chronic kidney disease: Secondary | ICD-10-CM | POA: Diagnosis not present

## 2018-09-13 DIAGNOSIS — N2581 Secondary hyperparathyroidism of renal origin: Secondary | ICD-10-CM | POA: Diagnosis not present

## 2018-09-13 DIAGNOSIS — E1129 Type 2 diabetes mellitus with other diabetic kidney complication: Secondary | ICD-10-CM | POA: Diagnosis not present

## 2018-09-13 DIAGNOSIS — D689 Coagulation defect, unspecified: Secondary | ICD-10-CM | POA: Diagnosis not present

## 2018-09-13 DIAGNOSIS — D631 Anemia in chronic kidney disease: Secondary | ICD-10-CM | POA: Diagnosis not present

## 2018-09-13 DIAGNOSIS — D509 Iron deficiency anemia, unspecified: Secondary | ICD-10-CM | POA: Diagnosis not present

## 2018-09-13 DIAGNOSIS — N186 End stage renal disease: Secondary | ICD-10-CM | POA: Diagnosis not present

## 2018-09-13 DIAGNOSIS — Z452 Encounter for adjustment and management of vascular access device: Secondary | ICD-10-CM | POA: Diagnosis not present

## 2018-09-16 DIAGNOSIS — N186 End stage renal disease: Secondary | ICD-10-CM | POA: Diagnosis not present

## 2018-09-16 DIAGNOSIS — D689 Coagulation defect, unspecified: Secondary | ICD-10-CM | POA: Diagnosis not present

## 2018-09-16 DIAGNOSIS — E1129 Type 2 diabetes mellitus with other diabetic kidney complication: Secondary | ICD-10-CM | POA: Diagnosis not present

## 2018-09-16 DIAGNOSIS — N2581 Secondary hyperparathyroidism of renal origin: Secondary | ICD-10-CM | POA: Diagnosis not present

## 2018-09-16 DIAGNOSIS — D509 Iron deficiency anemia, unspecified: Secondary | ICD-10-CM | POA: Diagnosis not present

## 2018-09-16 DIAGNOSIS — D631 Anemia in chronic kidney disease: Secondary | ICD-10-CM | POA: Diagnosis not present

## 2018-09-16 DIAGNOSIS — Z452 Encounter for adjustment and management of vascular access device: Secondary | ICD-10-CM | POA: Diagnosis not present

## 2018-09-18 DIAGNOSIS — D631 Anemia in chronic kidney disease: Secondary | ICD-10-CM | POA: Diagnosis not present

## 2018-09-18 DIAGNOSIS — D689 Coagulation defect, unspecified: Secondary | ICD-10-CM | POA: Diagnosis not present

## 2018-09-18 DIAGNOSIS — Z452 Encounter for adjustment and management of vascular access device: Secondary | ICD-10-CM | POA: Diagnosis not present

## 2018-09-18 DIAGNOSIS — D509 Iron deficiency anemia, unspecified: Secondary | ICD-10-CM | POA: Diagnosis not present

## 2018-09-18 DIAGNOSIS — E1129 Type 2 diabetes mellitus with other diabetic kidney complication: Secondary | ICD-10-CM | POA: Diagnosis not present

## 2018-09-18 DIAGNOSIS — N2581 Secondary hyperparathyroidism of renal origin: Secondary | ICD-10-CM | POA: Diagnosis not present

## 2018-09-18 DIAGNOSIS — N186 End stage renal disease: Secondary | ICD-10-CM | POA: Diagnosis not present

## 2018-09-19 DIAGNOSIS — I10 Essential (primary) hypertension: Secondary | ICD-10-CM | POA: Diagnosis not present

## 2018-09-19 DIAGNOSIS — D509 Iron deficiency anemia, unspecified: Secondary | ICD-10-CM | POA: Diagnosis not present

## 2018-09-19 DIAGNOSIS — G4700 Insomnia, unspecified: Secondary | ICD-10-CM | POA: Diagnosis not present

## 2018-09-19 DIAGNOSIS — E1140 Type 2 diabetes mellitus with diabetic neuropathy, unspecified: Secondary | ICD-10-CM | POA: Diagnosis not present

## 2018-09-19 DIAGNOSIS — K219 Gastro-esophageal reflux disease without esophagitis: Secondary | ICD-10-CM | POA: Diagnosis not present

## 2018-09-20 DIAGNOSIS — N186 End stage renal disease: Secondary | ICD-10-CM | POA: Diagnosis not present

## 2018-09-20 DIAGNOSIS — Z452 Encounter for adjustment and management of vascular access device: Secondary | ICD-10-CM | POA: Diagnosis not present

## 2018-09-20 DIAGNOSIS — D509 Iron deficiency anemia, unspecified: Secondary | ICD-10-CM | POA: Diagnosis not present

## 2018-09-20 DIAGNOSIS — D689 Coagulation defect, unspecified: Secondary | ICD-10-CM | POA: Diagnosis not present

## 2018-09-20 DIAGNOSIS — D631 Anemia in chronic kidney disease: Secondary | ICD-10-CM | POA: Diagnosis not present

## 2018-09-20 DIAGNOSIS — N2581 Secondary hyperparathyroidism of renal origin: Secondary | ICD-10-CM | POA: Diagnosis not present

## 2018-09-20 DIAGNOSIS — E1129 Type 2 diabetes mellitus with other diabetic kidney complication: Secondary | ICD-10-CM | POA: Diagnosis not present

## 2018-09-22 DIAGNOSIS — E1122 Type 2 diabetes mellitus with diabetic chronic kidney disease: Secondary | ICD-10-CM | POA: Diagnosis not present

## 2018-09-22 DIAGNOSIS — Z992 Dependence on renal dialysis: Secondary | ICD-10-CM | POA: Diagnosis not present

## 2018-09-22 DIAGNOSIS — N186 End stage renal disease: Secondary | ICD-10-CM | POA: Diagnosis not present

## 2018-09-23 DIAGNOSIS — D509 Iron deficiency anemia, unspecified: Secondary | ICD-10-CM | POA: Diagnosis not present

## 2018-09-23 DIAGNOSIS — L989 Disorder of the skin and subcutaneous tissue, unspecified: Secondary | ICD-10-CM | POA: Diagnosis not present

## 2018-09-23 DIAGNOSIS — D631 Anemia in chronic kidney disease: Secondary | ICD-10-CM | POA: Diagnosis not present

## 2018-09-23 DIAGNOSIS — D689 Coagulation defect, unspecified: Secondary | ICD-10-CM | POA: Diagnosis not present

## 2018-09-23 DIAGNOSIS — N186 End stage renal disease: Secondary | ICD-10-CM | POA: Diagnosis not present

## 2018-09-23 DIAGNOSIS — N2581 Secondary hyperparathyroidism of renal origin: Secondary | ICD-10-CM | POA: Diagnosis not present

## 2018-09-23 DIAGNOSIS — Z452 Encounter for adjustment and management of vascular access device: Secondary | ICD-10-CM | POA: Diagnosis not present

## 2018-09-23 DIAGNOSIS — E1129 Type 2 diabetes mellitus with other diabetic kidney complication: Secondary | ICD-10-CM | POA: Diagnosis not present

## 2018-09-25 DIAGNOSIS — L989 Disorder of the skin and subcutaneous tissue, unspecified: Secondary | ICD-10-CM | POA: Diagnosis not present

## 2018-09-25 DIAGNOSIS — E1129 Type 2 diabetes mellitus with other diabetic kidney complication: Secondary | ICD-10-CM | POA: Diagnosis not present

## 2018-09-25 DIAGNOSIS — N2581 Secondary hyperparathyroidism of renal origin: Secondary | ICD-10-CM | POA: Diagnosis not present

## 2018-09-25 DIAGNOSIS — D631 Anemia in chronic kidney disease: Secondary | ICD-10-CM | POA: Diagnosis not present

## 2018-09-25 DIAGNOSIS — Z452 Encounter for adjustment and management of vascular access device: Secondary | ICD-10-CM | POA: Diagnosis not present

## 2018-09-25 DIAGNOSIS — N186 End stage renal disease: Secondary | ICD-10-CM | POA: Diagnosis not present

## 2018-09-25 DIAGNOSIS — D509 Iron deficiency anemia, unspecified: Secondary | ICD-10-CM | POA: Diagnosis not present

## 2018-09-25 DIAGNOSIS — D689 Coagulation defect, unspecified: Secondary | ICD-10-CM | POA: Diagnosis not present

## 2018-09-27 DIAGNOSIS — N186 End stage renal disease: Secondary | ICD-10-CM | POA: Diagnosis not present

## 2018-09-27 DIAGNOSIS — E1129 Type 2 diabetes mellitus with other diabetic kidney complication: Secondary | ICD-10-CM | POA: Diagnosis not present

## 2018-09-27 DIAGNOSIS — N2581 Secondary hyperparathyroidism of renal origin: Secondary | ICD-10-CM | POA: Diagnosis not present

## 2018-09-27 DIAGNOSIS — D631 Anemia in chronic kidney disease: Secondary | ICD-10-CM | POA: Diagnosis not present

## 2018-09-27 DIAGNOSIS — D509 Iron deficiency anemia, unspecified: Secondary | ICD-10-CM | POA: Diagnosis not present

## 2018-09-27 DIAGNOSIS — Z452 Encounter for adjustment and management of vascular access device: Secondary | ICD-10-CM | POA: Diagnosis not present

## 2018-09-27 DIAGNOSIS — D689 Coagulation defect, unspecified: Secondary | ICD-10-CM | POA: Diagnosis not present

## 2018-09-27 DIAGNOSIS — L989 Disorder of the skin and subcutaneous tissue, unspecified: Secondary | ICD-10-CM | POA: Diagnosis not present

## 2018-09-30 DIAGNOSIS — D509 Iron deficiency anemia, unspecified: Secondary | ICD-10-CM | POA: Diagnosis not present

## 2018-09-30 DIAGNOSIS — N2581 Secondary hyperparathyroidism of renal origin: Secondary | ICD-10-CM | POA: Diagnosis not present

## 2018-09-30 DIAGNOSIS — E1129 Type 2 diabetes mellitus with other diabetic kidney complication: Secondary | ICD-10-CM | POA: Diagnosis not present

## 2018-09-30 DIAGNOSIS — N186 End stage renal disease: Secondary | ICD-10-CM | POA: Diagnosis not present

## 2018-09-30 DIAGNOSIS — Z452 Encounter for adjustment and management of vascular access device: Secondary | ICD-10-CM | POA: Diagnosis not present

## 2018-09-30 DIAGNOSIS — D689 Coagulation defect, unspecified: Secondary | ICD-10-CM | POA: Diagnosis not present

## 2018-09-30 DIAGNOSIS — D631 Anemia in chronic kidney disease: Secondary | ICD-10-CM | POA: Diagnosis not present

## 2018-09-30 DIAGNOSIS — L989 Disorder of the skin and subcutaneous tissue, unspecified: Secondary | ICD-10-CM | POA: Diagnosis not present

## 2018-10-02 DIAGNOSIS — N2581 Secondary hyperparathyroidism of renal origin: Secondary | ICD-10-CM | POA: Diagnosis not present

## 2018-10-02 DIAGNOSIS — D631 Anemia in chronic kidney disease: Secondary | ICD-10-CM | POA: Diagnosis not present

## 2018-10-02 DIAGNOSIS — E1129 Type 2 diabetes mellitus with other diabetic kidney complication: Secondary | ICD-10-CM | POA: Diagnosis not present

## 2018-10-02 DIAGNOSIS — D689 Coagulation defect, unspecified: Secondary | ICD-10-CM | POA: Diagnosis not present

## 2018-10-02 DIAGNOSIS — D509 Iron deficiency anemia, unspecified: Secondary | ICD-10-CM | POA: Diagnosis not present

## 2018-10-02 DIAGNOSIS — N186 End stage renal disease: Secondary | ICD-10-CM | POA: Diagnosis not present

## 2018-10-02 DIAGNOSIS — Z452 Encounter for adjustment and management of vascular access device: Secondary | ICD-10-CM | POA: Diagnosis not present

## 2018-10-02 DIAGNOSIS — L989 Disorder of the skin and subcutaneous tissue, unspecified: Secondary | ICD-10-CM | POA: Diagnosis not present

## 2018-10-04 ENCOUNTER — Other Ambulatory Visit: Payer: Self-pay

## 2018-10-04 DIAGNOSIS — D631 Anemia in chronic kidney disease: Secondary | ICD-10-CM | POA: Diagnosis not present

## 2018-10-04 DIAGNOSIS — Z992 Dependence on renal dialysis: Secondary | ICD-10-CM

## 2018-10-04 DIAGNOSIS — N2581 Secondary hyperparathyroidism of renal origin: Secondary | ICD-10-CM | POA: Diagnosis not present

## 2018-10-04 DIAGNOSIS — D689 Coagulation defect, unspecified: Secondary | ICD-10-CM | POA: Diagnosis not present

## 2018-10-04 DIAGNOSIS — Z452 Encounter for adjustment and management of vascular access device: Secondary | ICD-10-CM | POA: Diagnosis not present

## 2018-10-04 DIAGNOSIS — N186 End stage renal disease: Secondary | ICD-10-CM

## 2018-10-04 DIAGNOSIS — E1129 Type 2 diabetes mellitus with other diabetic kidney complication: Secondary | ICD-10-CM | POA: Diagnosis not present

## 2018-10-04 DIAGNOSIS — L989 Disorder of the skin and subcutaneous tissue, unspecified: Secondary | ICD-10-CM | POA: Diagnosis not present

## 2018-10-04 DIAGNOSIS — D509 Iron deficiency anemia, unspecified: Secondary | ICD-10-CM | POA: Diagnosis not present

## 2018-10-07 ENCOUNTER — Telehealth: Payer: Self-pay | Admitting: Cardiology

## 2018-10-07 ENCOUNTER — Other Ambulatory Visit: Payer: Self-pay | Admitting: *Deleted

## 2018-10-07 ENCOUNTER — Other Ambulatory Visit: Payer: Self-pay

## 2018-10-07 ENCOUNTER — Encounter: Payer: Self-pay | Admitting: *Deleted

## 2018-10-07 ENCOUNTER — Encounter: Payer: Self-pay | Admitting: Surgery

## 2018-10-07 ENCOUNTER — Ambulatory Visit: Payer: Medicare Other | Admitting: Surgery

## 2018-10-07 ENCOUNTER — Ambulatory Visit (HOSPITAL_COMMUNITY)
Admission: RE | Admit: 2018-10-07 | Discharge: 2018-10-07 | Disposition: A | Discharge status: Home / Self Care | Payer: Medicare Other | Source: Ambulatory Visit | Attending: Surgery | Admitting: Surgery

## 2018-10-07 DIAGNOSIS — Z992 Dependence on renal dialysis: Secondary | ICD-10-CM | POA: Insufficient documentation

## 2018-10-07 DIAGNOSIS — N186 End stage renal disease: Secondary | ICD-10-CM

## 2018-10-07 DIAGNOSIS — L989 Disorder of the skin and subcutaneous tissue, unspecified: Secondary | ICD-10-CM | POA: Diagnosis not present

## 2018-10-07 DIAGNOSIS — D689 Coagulation defect, unspecified: Secondary | ICD-10-CM | POA: Diagnosis not present

## 2018-10-07 DIAGNOSIS — Z452 Encounter for adjustment and management of vascular access device: Secondary | ICD-10-CM | POA: Diagnosis not present

## 2018-10-07 DIAGNOSIS — N2581 Secondary hyperparathyroidism of renal origin: Secondary | ICD-10-CM | POA: Diagnosis not present

## 2018-10-07 DIAGNOSIS — D509 Iron deficiency anemia, unspecified: Secondary | ICD-10-CM | POA: Diagnosis not present

## 2018-10-07 DIAGNOSIS — E1129 Type 2 diabetes mellitus with other diabetic kidney complication: Secondary | ICD-10-CM | POA: Diagnosis not present

## 2018-10-07 DIAGNOSIS — D631 Anemia in chronic kidney disease: Secondary | ICD-10-CM | POA: Diagnosis not present

## 2018-10-07 HISTORY — DX: End stage renal disease: N18.6

## 2018-10-07 MED ORDER — AMIODARONE HCL 200 MG PO TABS: 200.0000 mg | ORAL_TABLET | Freq: Every day | ORAL | 1 refills | Status: DC

## 2018-10-07 NOTE — Progress Notes (Signed)
[facility] [place]  Patient name: James Chan MRN: [id number] DOB: June 21, 1950 Sex: male   REASON FOR VISIT:    Follow up  HISOTRY OF PRESENT ILLNESS:    James Chan is a 68 y.o. male who is status post 1st stage right basilic vein fistula creation on 07-04-2018.  This is his first follow up after surgery  The patient has a history of peripheral vascular disease with buttock and calf claudication which is being managed at [facility].  He has a history of carotid stenosis and has undergone bilateral carotid stenting.  These were done for TIAs.  He has undergone multiple coronary intervention PAST MEDICAL HISTORY:   Past Medical History:  Diagnosis Date  . Anemia   . Anxiety state, unspecified 09/15/2008   Qualifier: Diagnosis of  By: Bullins CMA, Ami    . Arterial insufficiency of lower extremity ([place]) 05/10/2016  . Arthritis   . BACK PAIN, LUMBAR, CHRONIC 09/15/2008   Qualifier: Diagnosis of  By: Bullins CMA, Ami    . CHF (congestive heart failure) ([place])   . Claudication ([place]) 10/30/2014  . Comprehensive diabetic foot examination, type 2 DM, encounter for [facility]) 09/15/2008   Qualifier: Diagnosis of  By: Bullins CMA, Ami    . DEPRESSIVE DISORDER 09/15/2008   Qualifier: Diagnosis of  By: Bullins CMA, Ami    . Diabetes mellitus without complication ([place])   . Diabetic polyneuropathy associated with diabetes mellitus due to underlying condition ([place]) 06/28/2015  . Dyslipidemia 11/24/2015  . Dyspnea   . End stage renal disease on dialysis ([place])    M/W/F [place]  . Esophageal reflux 09/15/2008   Qualifier: Diagnosis of  By: Bullins CMA, Ami    . Glaucoma   . Gout   . Hammer toes of both feet 06/28/2015  . History of carotid artery stenosis 10/30/2014  . 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 Hyperlipidemia   . Hypertension   . Hypertensive heart failure ([place]) 11/28/2016  . HYPERTRIGLYCERIDEMIA 09/15/2008   Qualifier: Diagnosis of  By: Bullins CMA, Ami    . Myocardial infarction ([place])   . NSTEMI (non-ST elevated myocardial infarction) ([place]) 09/15/2008   Qualifier: Diagnosis of  By: Bullins CMA, Ami    . Onychomycosis due to dermatophyte 06/28/2015  . PAD (peripheral artery disease) ([place]) 10/30/2014  . Sleep apnea    no CPAP  . Stroke [facility])    no residual     FAMILY HISTORY:   Family History  Problem Relation Age of Onset  . Heart disease Mother   . Cancer Mother   . Heart disease Father   . Cancer Father     SOCIAL HISTORY:   Social History   Tobacco Use  . Smoking status: Former Smoker    Quit date: 05/09/1994    Years since quitting: 24.4  . Smokeless tobacco: Never Used  Substance Use Topics  . Alcohol use: No     ALLERGIES:   No Known Allergies   CURRENT MEDICATIONS:   Current Outpatient Medications  Medication Sig Dispense Refill  . allopurinol (ZYLOPRIM) 100 MG tablet Take 100 mg by mouth at bedtime.     Marland Kitchen amiodarone (PACERONE) 200 MG tablet Take 1 tablet (200 mg total) by mouth daily. 90 tablet 1  . aspirin EC 81 MG tablet Take 81 mg by mouth daily.    Marland Kitchen atorvastatin (LIPITOR) 40 MG tablet Take 40 mg by mouth every evening.     . cloNIDine (  CATAPRES) 0.1 MG tablet Take 1 tablet (0.1 mg) in the morning if SBP (top BP number) > 160 and take 1 tablet (0.1 mg) at bedtime. 180 tablet 0  . clopidogrel (PLAVIX) 75 MG tablet Take 75 mg by mouth at bedtime.     . folic acid-vitamin b complex-vitamin c-selenium-zinc (DIALYVITE) 3 MG TABS tablet Take 1 tablet by mouth every evening.    . hydrALAZINE (APRESOLINE) 25 MG tablet Take 1 tablet (25 mg) by mouth twice daily except for dialysis days only take 1 tablet in the evening. (Patient taking differently: Take 25 mg by mouth 2 (two) times daily. )    . insulin detemir (LEVEMIR) 100 UNIT/ML injection Inject 35 Units into the skin 2 (two) times daily.     Marland Kitchen latanoprost (XALATAN) 0.005 % ophthalmic solution Place 1 drop into  both eyes at bedtime.    . metoprolol tartrate (LOPRESSOR) 25 MG tablet Take 25 mg by mouth daily.     . nitroGLYCERIN (NITROSTAT) 0.4 MG SL tablet Place 1 tablet (0.4 mg total) under the tongue every 5 (five) minutes as needed for chest pain. 25 tablet 3  . oxyCODONE (ROXICODONE) 5 MG immediate release tablet Take 1 tablet (5 mg total) by mouth every 8 (eight) hours as needed. 20 tablet 0  . oxyCODONE-acetaminophen (PERCOCET/ROXICET) 5-325 MG tablet Take 1 tablet by mouth every 4 (four) hours as needed for moderate pain or severe pain.     . polyethylene glycol (MIRALAX / GLYCOLAX) packet Take 17 g by mouth daily as needed for mild constipation.    Marland Kitchen PROAIR HFA 108 (90 Base) MCG/ACT inhaler Inhale 2 puffs into the lungs every 6 (six) hours as needed for wheezing or shortness of breath.   12   No current facility-administered medications for this visit.     REVIEW OF SYSTEMS:   [X]  denotes positive finding, [ ]  denotes negative finding Cardiac  Comments:  Chest pain or chest pressure:    Shortness of breath upon exertion:    Short of breath when lying flat:    Irregular heart rhythm:        Vascular    Pain in calf, thigh, or hip brought on by ambulation:    Pain in feet at night that wakes you up from your sleep:     Blood clot in your veins:    Leg swelling:         Pulmonary    Oxygen at home:    Productive cough:     Wheezing:         Neurologic    Sudden weakness in arms or legs:     Sudden numbness in arms or legs:     Sudden onset of difficulty speaking or slurred speech:    Temporary loss of vision in one eye:     Problems with dizziness:         Gastrointestinal    Blood in stool:     Vomited blood:         Genitourinary    Burning when urinating:     Blood in urine:        Psychiatric    Major depression:         Hematologic    Bleeding problems:    Problems with blood clotting too easily:        Skin    Rashes or ulcers:        Constitutional     Fever or chills:  PHYSICAL EXAM:   Vitals:   10/07/18 1510  BP: (!) 146/55  Pulse: (!) 51  Resp: 20  Temp: 97.9 F (36.6 C)  SpO2: 98%  Weight: 84.8 kg  Height: 6' (1.829 m)    GENERAL: The patient is a well-nourished male, in no acute distress. The vital signs are documented above. CARDIAC: There is a regular rate and rhythm.  VASCULAR: palpable thrill in right BVt PULMONARY: Non-labored respirations MUSCULOSKELETAL: There are no major deformities or cyanosis. NEUROLOGIC: No focal weakness or paresthesias are detected. SKIN: There are no ulcers or rashes noted. PSYCHIATRIC: The patient has a normal affect.  STUDIES:   I have reviewed the following: +------------+----------+-------------+----------+----------------+ OUTFLOW VEINPSV (cm/s)Diameter (cm)Depth (cm)    Describe     +------------+----------+-------------+----------+----------------+ Prox UA        476        0.55        0.61   competing branch +------------+----------+-------------+----------+----------------+ Mid UA         409        0.54        0.58                    +------------+----------+-------------+----------+----------------+ Dist UA        823        0.54        0.59   competing branch +------------+----------+-------------+----------+----------------+ AC Fossa       666        0.43        0.42                    +------------+----------+-------------+----------+----------------+   MEDICAL ISSUES:   ESRD:  We will plan for second stage right BVT on Thursday June 18.  He is stopping his Plavix today.  Details of surgery discussed.  All questions answered.    Leia Alf, MD, FACS [facility] [phone] Pager [phone]

## 2018-10-07 NOTE — H&P (View-Only) (Signed)
[facility] [place]  Patient name: James Chan MRN: [id number] DOB: [birth year] Sex: male   REASON FOR VISIT:    Follow up  HISOTRY OF PRESENT ILLNESS:    James Chan is a 68 y.o. male who is status post 1st stage right basilic vein fistula creation on 07-04-2018.  This is his first follow up after surgery  The patient has a history of peripheral vascular disease with buttock and calf claudication which is being managed at [facility].  He has a history of carotid stenosis and has undergone bilateral carotid stenting.  These were done for TIAs.  He has undergone multiple coronary intervention PAST MEDICAL HISTORY:   Past Medical History:  Diagnosis Date  . Anemia   . Anxiety state, unspecified 09/15/2008   Qualifier: Diagnosis of  By: Bullins CMA, Ami    . Arterial insufficiency of lower extremity ([place]) 05/10/2016  . Arthritis   . BACK PAIN, LUMBAR, CHRONIC 09/15/2008   Qualifier: Diagnosis of  By: Bullins CMA, Ami    . CHF (congestive heart failure) ([place])   . Claudication ([place]) 10/30/2014  . Comprehensive diabetic foot examination, type 2 DM, encounter for [facility]) 09/15/2008   Qualifier: Diagnosis of  By: Bullins CMA, Ami    . DEPRESSIVE DISORDER 09/15/2008   Qualifier: Diagnosis of  By: Bullins CMA, Ami    . Diabetes mellitus without complication ([place])   . Diabetic polyneuropathy associated with diabetes mellitus due to underlying condition ([place]) 06/28/2015  . Dyslipidemia 11/24/2015  . Dyspnea   . End stage renal disease on dialysis ([place])    M/W/F [facility]  . Esophageal reflux 09/15/2008   Qualifier: Diagnosis of  By: Bullins CMA, Ami    . Glaucoma   . Gout   . Hammer toes of both feet 06/28/2015  . History of carotid artery stenosis 10/30/2014  . 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 Hyperlipidemia   . Hypertension   . Hypertensive heart failure ([place]) 11/28/2016  . HYPERTRIGLYCERIDEMIA 09/15/2008   Qualifier: Diagnosis of  By: Bullins CMA, Ami    . Myocardial infarction ([place])   . NSTEMI (non-ST elevated myocardial infarction) ([place]) 09/15/2008   Qualifier: Diagnosis of  By: Bullins CMA, Ami    . Onychomycosis due to dermatophyte 06/28/2015  . PAD (peripheral artery disease) ([place]) 10/30/2014  . Sleep apnea    no CPAP  . Stroke [facility])    no residual     FAMILY HISTORY:   Family History  Problem Relation Age of Onset  . Heart disease Mother   . Cancer Mother   . Heart disease Father   . Cancer Father     SOCIAL HISTORY:   Social History   Tobacco Use  . Smoking status: Former Smoker    Quit date: 05/09/1994    Years since quitting: 24.4  . Smokeless tobacco: Never Used  Substance Use Topics  . Alcohol use: No     ALLERGIES:   No Known Allergies   CURRENT MEDICATIONS:   Current Outpatient Medications  Medication Sig Dispense Refill  . allopurinol (ZYLOPRIM) 100 MG tablet Take 100 mg by mouth at bedtime.     Marland Kitchen amiodarone (PACERONE) 200 MG tablet Take 1 tablet (200 mg total) by mouth daily. 90 tablet 1  . aspirin EC 81 MG tablet Take 81 mg by mouth daily.    Marland Kitchen atorvastatin (LIPITOR) 40 MG tablet Take 40 mg by mouth every evening.     . cloNIDine (  CATAPRES) 0.1 MG tablet Take 1 tablet (0.1 mg) in the morning if SBP (top BP number) > 160 and take 1 tablet (0.1 mg) at bedtime. 180 tablet 0  . clopidogrel (PLAVIX) 75 MG tablet Take 75 mg by mouth at bedtime.     . folic acid-vitamin b complex-vitamin c-selenium-zinc (DIALYVITE) 3 MG TABS tablet Take 1 tablet by mouth every evening.    . hydrALAZINE (APRESOLINE) 25 MG tablet Take 1 tablet (25 mg) by mouth twice daily except for dialysis days only take 1 tablet in the evening. (Patient taking differently: Take 25 mg by mouth 2 (two) times daily. )    . insulin detemir (LEVEMIR) 100 UNIT/ML injection Inject 35 Units into the skin 2 (two) times daily.     Marland Kitchen latanoprost (XALATAN) 0.005 % ophthalmic solution Place 1 drop into  both eyes at bedtime.    . metoprolol tartrate (LOPRESSOR) 25 MG tablet Take 25 mg by mouth daily.     . nitroGLYCERIN (NITROSTAT) 0.4 MG SL tablet Place 1 tablet (0.4 mg total) under the tongue every 5 (five) minutes as needed for chest pain. 25 tablet 3  . oxyCODONE (ROXICODONE) 5 MG immediate release tablet Take 1 tablet (5 mg total) by mouth every 8 (eight) hours as needed. 20 tablet 0  . oxyCODONE-acetaminophen (PERCOCET/ROXICET) 5-325 MG tablet Take 1 tablet by mouth every 4 (four) hours as needed for moderate pain or severe pain.     . polyethylene glycol (MIRALAX / GLYCOLAX) packet Take 17 g by mouth daily as needed for mild constipation.    Marland Kitchen PROAIR HFA 108 (90 Base) MCG/ACT inhaler Inhale 2 puffs into the lungs every 6 (six) hours as needed for wheezing or shortness of breath.   12   No current facility-administered medications for this visit.     REVIEW OF SYSTEMS:   [X]  denotes positive finding, [ ]  denotes negative finding Cardiac  Comments:  Chest pain or chest pressure:    Shortness of breath upon exertion:    Short of breath when lying flat:    Irregular heart rhythm:        Vascular    Pain in calf, thigh, or hip brought on by ambulation:    Pain in feet at night that wakes you up from your sleep:     Blood clot in your veins:    Leg swelling:         Pulmonary    Oxygen at home:    Productive cough:     Wheezing:         Neurologic    Sudden weakness in arms or legs:     Sudden numbness in arms or legs:     Sudden onset of difficulty speaking or slurred speech:    Temporary loss of vision in one eye:     Problems with dizziness:         Gastrointestinal    Blood in stool:     Vomited blood:         Genitourinary    Burning when urinating:     Blood in urine:        Psychiatric    Major depression:         Hematologic    Bleeding problems:    Problems with blood clotting too easily:        Skin    Rashes or ulcers:        Constitutional     Fever or chills:  PHYSICAL EXAM:   Vitals:   10/07/18 1510  BP: (!) 146/55  Pulse: (!) 51  Resp: 20  Temp: 97.9 F (36.6 C)  SpO2: 98%  Weight: 84.8 kg  Height: 6' (1.829 m)    GENERAL: The patient is a well-nourished male, in no acute distress. The vital signs are documented above. CARDIAC: There is a regular rate and rhythm.  VASCULAR: palpable thrill in right BVt PULMONARY: Non-labored respirations MUSCULOSKELETAL: There are no major deformities or cyanosis. NEUROLOGIC: No focal weakness or paresthesias are detected. SKIN: There are no ulcers or rashes noted. PSYCHIATRIC: The patient has a normal affect.  STUDIES:   I have reviewed the following: +------------+----------+-------------+----------+----------------+ OUTFLOW VEINPSV (cm/s)Diameter (cm)Depth (cm)    Describe     +------------+----------+-------------+----------+----------------+ Prox UA        476        0.55        0.61   competing branch +------------+----------+-------------+----------+----------------+ Mid UA         409        0.54        0.58                    +------------+----------+-------------+----------+----------------+ Dist UA        823        0.54        0.59   competing branch +------------+----------+-------------+----------+----------------+ AC Fossa       666        0.43        0.42                    +------------+----------+-------------+----------+----------------+   MEDICAL ISSUES:   ESRD:  We will plan for second stage right BVT on Thursday June 18.  He is stopping his Plavix today.  Details of surgery discussed.  All questions answered.    Leia Alf, MD, FACS [facility] [phone] Pager [phone]

## 2018-10-07 NOTE — Telephone Encounter (Signed)
°*  STAT* If patient is at the pharmacy, call can be transferred to refill team.   1. Which medications need to be refilled? (please list name of each medication and dose if known) Amiodarone 200mg  tablet once daily  2. Which pharmacy/location (including street and city if local pharmacy) is medication to be sent to? [facility]  3. Do they need a 30 day or 90 day supply? James Chan

## 2018-10-08 ENCOUNTER — Encounter (HOSPITAL_COMMUNITY): Payer: Self-pay | Admitting: *Deleted

## 2018-10-08 ENCOUNTER — Other Ambulatory Visit: Payer: Self-pay

## 2018-10-08 MED ORDER — OXYCODONE HCL 5 MG/5ML PO SOLN: 5.0000 mg | Freq: Once | ORAL | Status: DC | PRN

## 2018-10-08 MED ORDER — ONDANSETRON HCL 4 MG/2ML IJ SOLN: 4.0000 mg | Freq: Four times a day (QID) | INTRAMUSCULAR | Status: DC | PRN

## 2018-10-08 MED ORDER — FENTANYL CITRATE (PF) 100 MCG/2ML IJ SOLN: 25.0000 ug | INTRAMUSCULAR | Status: DC | PRN

## 2018-10-08 MED ORDER — OXYCODONE HCL 5 MG PO TABS: 5.0000 mg | ORAL_TABLET | Freq: Once | ORAL | Status: DC | PRN

## 2018-10-08 NOTE — Progress Notes (Signed)
James Chan denies chest pain or shortness of breath. Patient denies that he nor his family has experienced any of the following: Cough Fever >100.4 Runny Nose Sore Throat Difficulty breathing/ shortness of breath Travel in past 14 days- no  James Doublin has type II diabetes, he reports that CBG runs 90- 110 in am. I instructed patient to take 1/2 dose of Lantus Wednesday hs- 15 units. Thursday am I instructed patient tha tif CBG is greater than 130 to take 1/2 of scheduled dose- take 7 units. I instructed patient to check CBG after awaking and every 2 hours until arrival  to the hospital.  I Instructed patient if CBG is less than 70 to drink1/2 cup of a clear juice. Recheck CBG in 15 minutes then call pre- op desk at [phone] for further instructions. If scheduled to receive Insulin, do not take Insulin.  Special instructions on OR schedule read that patient should continue taking Plavix, James Whisenant states that Dr. Trula Slade told him to hold Plavix.  On OR schedule case has left arm, orders have right arm, patient reports that it is her right arm. I have sent a message to Leota Jacobsen, RN to clarify these 2 things.

## 2018-10-09 DIAGNOSIS — D689 Coagulation defect, unspecified: Secondary | ICD-10-CM | POA: Diagnosis not present

## 2018-10-09 DIAGNOSIS — E1129 Type 2 diabetes mellitus with other diabetic kidney complication: Secondary | ICD-10-CM | POA: Diagnosis not present

## 2018-10-09 DIAGNOSIS — N186 End stage renal disease: Secondary | ICD-10-CM | POA: Diagnosis not present

## 2018-10-09 DIAGNOSIS — L989 Disorder of the skin and subcutaneous tissue, unspecified: Secondary | ICD-10-CM | POA: Diagnosis not present

## 2018-10-09 DIAGNOSIS — D631 Anemia in chronic kidney disease: Secondary | ICD-10-CM | POA: Diagnosis not present

## 2018-10-09 DIAGNOSIS — Z452 Encounter for adjustment and management of vascular access device: Secondary | ICD-10-CM | POA: Diagnosis not present

## 2018-10-09 DIAGNOSIS — D509 Iron deficiency anemia, unspecified: Secondary | ICD-10-CM | POA: Diagnosis not present

## 2018-10-09 DIAGNOSIS — N2581 Secondary hyperparathyroidism of renal origin: Secondary | ICD-10-CM | POA: Diagnosis not present

## 2018-10-10 ENCOUNTER — Ambulatory Visit (HOSPITAL_COMMUNITY)
Admission: RE | Admit: 2018-10-10 | Discharge: 2018-10-10 | Disposition: A | Discharge status: Home / Self Care | Payer: Medicare Other | Attending: Surgery | Admitting: Surgery

## 2018-10-10 ENCOUNTER — Encounter (HOSPITAL_COMMUNITY): Payer: Self-pay

## 2018-10-10 ENCOUNTER — Ambulatory Visit (HOSPITAL_COMMUNITY): Payer: Medicare Other | Admitting: Anesthesiology

## 2018-10-10 ENCOUNTER — Encounter (HOSPITAL_COMMUNITY)
Admission: RE | Disposition: A | Discharge status: Home / Self Care | Payer: Self-pay | Source: Home / Self Care | Attending: Surgery

## 2018-10-10 ENCOUNTER — Other Ambulatory Visit: Payer: Self-pay

## 2018-10-10 DIAGNOSIS — D631 Anemia in chronic kidney disease: Secondary | ICD-10-CM | POA: Diagnosis not present

## 2018-10-10 DIAGNOSIS — N186 End stage renal disease: Secondary | ICD-10-CM | POA: Insufficient documentation

## 2018-10-10 DIAGNOSIS — E785 Hyperlipidemia, unspecified: Secondary | ICD-10-CM | POA: Diagnosis not present

## 2018-10-10 DIAGNOSIS — M109 Gout, unspecified: Secondary | ICD-10-CM | POA: Insufficient documentation

## 2018-10-10 DIAGNOSIS — Z79899 Other long term (current) drug therapy: Secondary | ICD-10-CM | POA: Diagnosis not present

## 2018-10-10 DIAGNOSIS — I252 Old myocardial infarction: Secondary | ICD-10-CM | POA: Diagnosis not present

## 2018-10-10 DIAGNOSIS — I6529 Occlusion and stenosis of unspecified carotid artery: Secondary | ICD-10-CM | POA: Insufficient documentation

## 2018-10-10 DIAGNOSIS — Z7902 Long term (current) use of antithrombotics/antiplatelets: Secondary | ICD-10-CM | POA: Diagnosis not present

## 2018-10-10 DIAGNOSIS — E1142 Type 2 diabetes mellitus with diabetic polyneuropathy: Secondary | ICD-10-CM | POA: Diagnosis not present

## 2018-10-10 DIAGNOSIS — Z992 Dependence on renal dialysis: Secondary | ICD-10-CM | POA: Diagnosis not present

## 2018-10-10 DIAGNOSIS — G4730 Sleep apnea, unspecified: Secondary | ICD-10-CM | POA: Insufficient documentation

## 2018-10-10 DIAGNOSIS — Z1159 Encounter for screening for other viral diseases: Secondary | ICD-10-CM | POA: Insufficient documentation

## 2018-10-10 DIAGNOSIS — I132 Hypertensive heart and chronic kidney disease with heart failure and with stage 5 chronic kidney disease, or end stage renal disease: Secondary | ICD-10-CM | POA: Insufficient documentation

## 2018-10-10 DIAGNOSIS — Z87891 Personal history of nicotine dependence: Secondary | ICD-10-CM | POA: Insufficient documentation

## 2018-10-10 DIAGNOSIS — Z7982 Long term (current) use of aspirin: Secondary | ICD-10-CM | POA: Insufficient documentation

## 2018-10-10 DIAGNOSIS — Z794 Long term (current) use of insulin: Secondary | ICD-10-CM | POA: Diagnosis not present

## 2018-10-10 DIAGNOSIS — I509 Heart failure, unspecified: Secondary | ICD-10-CM | POA: Diagnosis not present

## 2018-10-10 DIAGNOSIS — E1151 Type 2 diabetes mellitus with diabetic peripheral angiopathy without gangrene: Secondary | ICD-10-CM | POA: Diagnosis not present

## 2018-10-10 DIAGNOSIS — H409 Unspecified glaucoma: Secondary | ICD-10-CM | POA: Insufficient documentation

## 2018-10-10 DIAGNOSIS — I5042 Chronic combined systolic (congestive) and diastolic (congestive) heart failure: Secondary | ICD-10-CM | POA: Diagnosis not present

## 2018-10-10 DIAGNOSIS — Z8673 Personal history of transient ischemic attack (TIA), and cerebral infarction without residual deficits: Secondary | ICD-10-CM | POA: Insufficient documentation

## 2018-10-10 DIAGNOSIS — E1122 Type 2 diabetes mellitus with diabetic chronic kidney disease: Secondary | ICD-10-CM | POA: Diagnosis not present

## 2018-10-10 DIAGNOSIS — N185 Chronic kidney disease, stage 5: Secondary | ICD-10-CM | POA: Diagnosis not present

## 2018-10-10 HISTORY — PX: BASCILIC VEIN TRANSPOSITION: SHX5742

## 2018-10-10 HISTORY — DX: Personal history of other medical treatment: Z92.89

## 2018-10-10 LAB — POCT I-STAT 4, (NA,K, GLUC, HGB,HCT)
Glucose, Bld: 58 mg/dL — ABNORMAL LOW (ref 70–99)
HCT: 30 % — ABNORMAL LOW (ref 39.0–52.0)
Hemoglobin: 10.2 g/dL — ABNORMAL LOW (ref 13.0–17.0)
Potassium: 3.7 mmol/L (ref 3.5–5.1)
Sodium: 137 mmol/L (ref 135–145)

## 2018-10-10 LAB — GLUCOSE, CAPILLARY
Glucose-Capillary: 75 mg/dL (ref 70–99)
Glucose-Capillary: 113 mg/dL — ABNORMAL HIGH (ref 70–99)
Glucose-Capillary: 60 mg/dL — ABNORMAL LOW (ref 70–99)
Glucose-Capillary: 95 mg/dL (ref 70–99)
Glucose-Capillary: 65 mg/dL — ABNORMAL LOW (ref 70–99)
Glucose-Capillary: 67 mg/dL — ABNORMAL LOW (ref 70–99)
Glucose-Capillary: 167 mg/dL — ABNORMAL HIGH (ref 70–99)
Glucose-Capillary: 68 mg/dL — ABNORMAL LOW (ref 70–99)

## 2018-10-10 LAB — SARS CORONAVIRUS 2: SARS Coronavirus 2: NOT DETECTED

## 2018-10-10 SURGERY — TRANSPOSITION, VEIN, BASILIC
Anesthesia: General | Site: Arm Upper | Laterality: Right

## 2018-10-10 MED ORDER — ONDANSETRON HCL 4 MG/2ML IJ SOLN
INTRAMUSCULAR | Status: DC | PRN
  Administered 2018-10-10: 4 mg via INTRAVENOUS

## 2018-10-10 MED ORDER — DEXTROSE 50 % IV SOLN
12.5000 g | INTRAVENOUS | Status: AC
  Administered 2018-10-10: 10:00:00 12.5 g via INTRAVENOUS
  Filled 2018-10-10: qty 50

## 2018-10-10 MED ORDER — DEXTROSE 50 % IV SOLN
INTRAVENOUS | Status: AC
  Filled 2018-10-10: qty 50

## 2018-10-10 MED ORDER — PROPOFOL 10 MG/ML IV BOLUS
INTRAVENOUS | Status: AC
  Filled 2018-10-10: qty 20

## 2018-10-10 MED ORDER — PHENYLEPHRINE 40 MCG/ML (10ML) SYRINGE FOR IV PUSH (FOR BLOOD PRESSURE SUPPORT)
PREFILLED_SYRINGE | INTRAVENOUS | Status: AC
  Filled 2018-10-10: qty 10

## 2018-10-10 MED ORDER — SODIUM CHLORIDE 0.9 % IV SOLN
INTRAVENOUS | Status: AC
  Filled 2018-10-10: qty 1.2

## 2018-10-10 MED ORDER — SODIUM CHLORIDE 0.9 % IV SOLN
INTRAVENOUS | Status: DC | PRN
  Administered 2018-10-10: 11:00:00 25 ug/min via INTRAVENOUS

## 2018-10-10 MED ORDER — SODIUM CHLORIDE 0.9 % IV SOLN
INTRAVENOUS | Status: DC | PRN
  Administered 2018-10-10: 500 mL

## 2018-10-10 MED ORDER — ONDANSETRON HCL 4 MG/2ML IJ SOLN
INTRAMUSCULAR | Status: AC
  Filled 2018-10-10: qty 2

## 2018-10-10 MED ORDER — OXYCODONE HCL 5 MG/5ML PO SOLN: 5.0000 mg | Freq: Once | ORAL | Status: DC | PRN

## 2018-10-10 MED ORDER — SODIUM CHLORIDE 0.9 % IV SOLN
INTRAVENOUS | Status: DC
  Administered 2018-10-10 (×2): via INTRAVENOUS

## 2018-10-10 MED ORDER — EPHEDRINE SULFATE 50 MG/ML IJ SOLN
INTRAMUSCULAR | Status: DC | PRN
  Administered 2018-10-10 (×2): 10 mg via INTRAVENOUS

## 2018-10-10 MED ORDER — PROPOFOL 10 MG/ML IV BOLUS
INTRAVENOUS | Status: DC | PRN
  Administered 2018-10-10: 270 mg via INTRAVENOUS

## 2018-10-10 MED ORDER — DEXTROSE 50 % IV SOLN
12.5000 g | INTRAVENOUS | Status: AC
  Administered 2018-10-10: 12.5 g via INTRAVENOUS
  Filled 2018-10-10: qty 50

## 2018-10-10 MED ORDER — FENTANYL CITRATE (PF) 100 MCG/2ML IJ SOLN: 25.0000 ug | INTRAMUSCULAR | Status: DC | PRN

## 2018-10-10 MED ORDER — OXYCODONE HCL 5 MG PO TABS: 5.0000 mg | ORAL_TABLET | Freq: Once | ORAL | Status: DC | PRN

## 2018-10-10 MED ORDER — DEXTROSE 50 % IV SOLN: 0.5000 | Freq: Once | INTRAVENOUS | Status: DC

## 2018-10-10 MED ORDER — FENTANYL CITRATE (PF) 100 MCG/2ML IJ SOLN
INTRAMUSCULAR | Status: DC | PRN
  Administered 2018-10-10 (×3): 50 ug via INTRAVENOUS

## 2018-10-10 MED ORDER — CEFAZOLIN SODIUM-DEXTROSE 2-4 GM/100ML-% IV SOLN
2.0000 g | INTRAVENOUS | Status: AC
  Administered 2018-10-10: 11:00:00 2 g via INTRAVENOUS

## 2018-10-10 MED ORDER — 0.9 % SODIUM CHLORIDE (POUR BTL) OPTIME
TOPICAL | Status: DC | PRN
  Administered 2018-10-10: 1000 mL

## 2018-10-10 MED ORDER — HEMOSTATIC AGENTS (NO CHARGE) OPTIME
TOPICAL | Status: DC | PRN
  Administered 2018-10-10: 1 via TOPICAL

## 2018-10-10 MED ORDER — DEXTROSE 50 % IV SOLN
INTRAVENOUS | Status: AC
  Administered 2018-10-10: 25 mL
  Filled 2018-10-10: qty 50

## 2018-10-10 MED ORDER — EPHEDRINE 5 MG/ML INJ
INTRAVENOUS | Status: AC
  Filled 2018-10-10: qty 10

## 2018-10-10 MED ORDER — ONDANSETRON HCL 4 MG/2ML IJ SOLN: 4.0000 mg | Freq: Once | INTRAMUSCULAR | Status: DC | PRN

## 2018-10-10 MED ORDER — LIDOCAINE-EPINEPHRINE (PF) 1 %-1:200000 IJ SOLN
INTRAMUSCULAR | Status: AC
  Filled 2018-10-10: qty 30

## 2018-10-10 MED ORDER — CEFAZOLIN SODIUM-DEXTROSE 2-4 GM/100ML-% IV SOLN
INTRAVENOUS | Status: AC
  Filled 2018-10-10: qty 100

## 2018-10-10 MED ORDER — MIDAZOLAM HCL 5 MG/5ML IJ SOLN
INTRAMUSCULAR | Status: DC | PRN
  Administered 2018-10-10: 1 mg via INTRAVENOUS

## 2018-10-10 MED ORDER — FENTANYL CITRATE (PF) 250 MCG/5ML IJ SOLN
INTRAMUSCULAR | Status: AC
  Filled 2018-10-10: qty 5

## 2018-10-10 MED ORDER — MIDAZOLAM HCL 2 MG/2ML IJ SOLN
INTRAMUSCULAR | Status: AC
  Filled 2018-10-10: qty 2

## 2018-10-10 MED ORDER — OXYCODONE-ACETAMINOPHEN 5-325 MG PO TABS: 1.0000 | ORAL_TABLET | ORAL | 0 refills | Status: AC | PRN

## 2018-10-10 SURGICAL SUPPLY — 43 items
ADH SKN CLS APL DERMABOND .7 (GAUZE/BANDAGES/DRESSINGS) ×1
ARMBAND PINK RESTRICT EXTREMIT (MISCELLANEOUS) ×3 IMPLANT
CANISTER SUCT 3000ML PPV (MISCELLANEOUS) ×3 IMPLANT
CLIP VESOCCLUDE MED 24/CT (CLIP) IMPLANT
CLIP VESOCCLUDE MED 6/CT (CLIP) IMPLANT
CLIP VESOCCLUDE SM WIDE 24/CT (CLIP) IMPLANT
CLIP VESOCCLUDE SM WIDE 6/CT (CLIP) IMPLANT
COVER PROBE W GEL 5X96 (DRAPES) ×3 IMPLANT
COVER WAND RF STERILE (DRAPES) ×1 IMPLANT
DERMABOND ADVANCED (GAUZE/BANDAGES/DRESSINGS) ×2
DERMABOND ADVANCED .7 DNX12 (GAUZE/BANDAGES/DRESSINGS) ×1 IMPLANT
ELECT REM PT RETURN 9FT ADLT (ELECTROSURGICAL) ×3
ELECTRODE REM PT RTRN 9FT ADLT (ELECTROSURGICAL) ×1 IMPLANT
GLOVE BIOGEL PI IND STRL 6 (GLOVE) IMPLANT
GLOVE BIOGEL PI IND STRL 6.5 (GLOVE) IMPLANT
GLOVE BIOGEL PI IND STRL 7.5 (GLOVE) ×1 IMPLANT
GLOVE BIOGEL PI INDICATOR 6 (GLOVE) ×2
GLOVE BIOGEL PI INDICATOR 6.5 (GLOVE) ×2
GLOVE BIOGEL PI INDICATOR 7.5 (GLOVE) ×2
GLOVE SURG SS PI 7.5 STRL IVOR (GLOVE) ×3 IMPLANT
GOWN STRL NON-REIN LRG LVL3 (GOWN DISPOSABLE) ×2 IMPLANT
GOWN STRL REUS W/ TWL LRG LVL3 (GOWN DISPOSABLE) ×2 IMPLANT
GOWN STRL REUS W/ TWL XL LVL3 (GOWN DISPOSABLE) ×1 IMPLANT
GOWN STRL REUS W/TWL LRG LVL3 (GOWN DISPOSABLE) ×6
GOWN STRL REUS W/TWL XL LVL3 (GOWN DISPOSABLE) ×3
HEMOSTAT SNOW SURGICEL 2X4 (HEMOSTASIS) IMPLANT
KIT BASIN OR (CUSTOM PROCEDURE TRAY) ×3 IMPLANT
KIT TURNOVER KIT B (KITS) ×3 IMPLANT
MARKER SKIN DUAL TIP RULER LAB (MISCELLANEOUS) ×2 IMPLANT
NS IRRIG 1000ML POUR BTL (IV SOLUTION) ×3 IMPLANT
PACK CV ACCESS (CUSTOM PROCEDURE TRAY) ×3 IMPLANT
PAD ARMBOARD 7.5X6 YLW CONV (MISCELLANEOUS) ×6 IMPLANT
SPONGE LAP 18X18 X RAY DECT (DISPOSABLE) ×2 IMPLANT
SUT PROLENE 6 0 CC (SUTURE) ×5 IMPLANT
SUT SILK 2 0 SH (SUTURE) ×2 IMPLANT
SUT SILK 3 0 (SUTURE) ×3
SUT SILK 3-0 18XBRD TIE 12 (SUTURE) IMPLANT
SUT VIC AB 3-0 SH 27 (SUTURE) ×6
SUT VIC AB 3-0 SH 27X BRD (SUTURE) ×1 IMPLANT
SUT VICRYL 4-0 PS2 18IN ABS (SUTURE) ×5 IMPLANT
TOWEL GREEN STERILE (TOWEL DISPOSABLE) ×3 IMPLANT
UNDERPAD 30X30 (UNDERPADS AND DIAPERS) ×3 IMPLANT
WATER STERILE IRR 1000ML POUR (IV SOLUTION) ×3 IMPLANT

## 2018-10-10 NOTE — Op Note (Signed)
    Patient name: James Chan MRN: [id number] DOB: 1950/07/03 Sex: male  10/10/2018 Pre-operative Diagnosis: ESRD Post-operative diagnosis:  Same Surgeon:  [name] Assistants:  Gaye Alken Procedure:   2nd stage right basilic vein fistula creation Anesthesia:  General Blood Loss:  minimal Specimens:  none  Findings:  >7W[place] basilic.  Vein drained into brachial vein in the mid arm  Indications:  The patient comes in today for 2nd stage basilic veni fistula creation  Procedure:  The patient was identified in the holding area and taken to [place] [facility]  The patient was then placed supine on the table. general anesthesia was administered.  The patient was prepped and draped in the usual sterile fashion.  A time out was called and antibiotics were administered.  U/S was used to evaluate the vein in the right upper arm.  It was of excellent caliber.  2 longitudinal incisions were made over he vein in the right upper arm.  Through these incisions, the basilic vein was fully mobilized.  Multiple branched were ligated between silk ties.  The basilic vein drained into the brachial vein in the mid arm, and so the brachial vein was mobilized up to the axilla.  Once the vein was fully mobilized, it was marked for orientation.  A subcutaneous tunnel was then created.  The vein was then occluded at the [facility] and axilla.  The vein was transected near the [facility].  It was then brought through the previously created tunnel.  A end to end anastamosis was performed with 6-0 prolene.  THe clamps were released.  There was ana excellent thrill within the fistula.  Hemostasis was achieved.  The incision was closed with 2 layers of vicryl.  Dermabond was placed.   Disposition:  To PACU stable   V. [name], M.D., [facility] [place] Office: [phone] Pager:  [phone]

## 2018-10-10 NOTE — Progress Notes (Signed)
Dr. Linna Caprice made aware of the pt's continuous drops with the bs.

## 2018-10-10 NOTE — Progress Notes (Signed)
CBG 65 upon arrival to PACU. Pt given 172mls of coke. Will re-check CBG and continue to monitor for signs of hypoglycemia.

## 2018-10-10 NOTE — Progress Notes (Signed)
CBG re-check 67. Hypoglycemia standing order protocol followed. Graham crackers with peanut butter and apple juice given to pt. Will re-check CBG in 15 minutes.

## 2018-10-10 NOTE — Discharge Instructions (Signed)
.  vw

## 2018-10-10 NOTE — Interval H&P Note (Signed)
History and Physical Interval Note:  10/10/2018 10:05 AM  James Chan  has presented today for surgery, with the diagnosis of END STAGE RENAL DISEASE.  The various methods of treatment have been discussed with the patient and family. After consideration of risks, benefits and other options for treatment, the patient has consented to  Procedure(s): BASILIC VEIN TRANSPOSITION SECOND STAGE (Right) as a surgical intervention.  The patient's history has been reviewed, patient examined, no change in status, stable for surgery.  I have reviewed the patient's chart and labs.  Questions were answered to the patient's satisfaction.     [name]

## 2018-10-10 NOTE — Transfer of Care (Signed)
Immediate Anesthesia Transfer of Care Note  Patient: James Chan  Procedure(s) Performed: BASILIC VEIN TRANSPOSITION SECOND STAGE Right upper arm (Right Arm Upper)  Patient Location: PACU  Anesthesia Type:General  Level of Consciousness: awake, alert , oriented and sedated  Airway & Oxygen Therapy: Patient Spontanous Breathing and Patient connected to nasal cannula oxygen  Post-op Assessment: Report given to RN, Post -op Vital signs reviewed and stable and Patient moving all extremities  Post vital signs: Reviewed and stable  Last Vitals:  Vitals Value Taken Time  BP 158/101 10/10/18 1245  Temp    Pulse 61 10/10/18 1249  Resp 14 10/10/18 1249  SpO2 100 % 10/10/18 1249  Vitals shown include unvalidated device data.  Last Pain:  Vitals:   10/10/18 0749  TempSrc:   PainSc: 0-No pain      Patients Stated Pain Goal: 4 (67/20/94 7096)  Complications: No apparent anesthesia complications

## 2018-10-10 NOTE — Anesthesia Preprocedure Evaluation (Signed)
Anesthesia Evaluation  Patient identified by MRN, date of birth, ID band Patient awake    Reviewed: Allergy & Precautions, NPO status , Patient's Chart, lab work & pertinent test results  Airway Mallampati: II  TM Distance: >3 FB Neck ROM: Full    Dental  (+) Edentulous Upper, Edentulous Lower   Pulmonary former smoker,    breath sounds clear to auscultation       Cardiovascular hypertension,  Rhythm:Regular Rate:Normal     Neuro/Psych    GI/Hepatic   Endo/Other  diabetes  Renal/GU      Musculoskeletal   Abdominal   Peds  Hematology   Anesthesia Other Findings   Reproductive/Obstetrics                             Anesthesia Physical Anesthesia Plan  ASA: III  Anesthesia Plan: General   Post-op Pain Management:    Induction: Intravenous  PONV Risk Score and Plan: Ondansetron  Airway Management Planned: LMA  Additional Equipment:   Intra-op Plan:   Post-operative Plan:   Informed Consent: I have reviewed the patients History and Physical, chart, labs and discussed the procedure including the risks, benefits and alternatives for the proposed anesthesia with the patient or authorized representative who has indicated his/her understanding and acceptance.       Plan Discussed with: Anesthesiologist  Anesthesia Plan Comments:         Anesthesia Quick Evaluation

## 2018-10-10 NOTE — Anesthesia Procedure Notes (Signed)
Procedure Name: LMA Insertion Date/Time: 10/10/2018 10:50 AM Performed by: Scheryl Darter, CRNA Pre-anesthesia Checklist: Patient identified, Emergency Drugs available, Suction available and Patient being monitored Patient Re-evaluated:Patient Re-evaluated prior to induction Oxygen Delivery Method: Circle System Utilized Preoxygenation: Pre-oxygenation with 100% oxygen Induction Type: IV induction Ventilation: Mask ventilation without difficulty LMA: LMA inserted LMA Size: 5.0 Number of attempts: 1 Airway Equipment and Method: Bite block Placement Confirmation: positive ETCO2 Tube secured with: Tape Dental Injury: Teeth and Oropharynx as per pre-operative assessment

## 2018-10-10 NOTE — Anesthesia Postprocedure Evaluation (Signed)
Anesthesia Post Note  Patient: James Chan  Procedure(s) Performed: BASILIC VEIN TRANSPOSITION SECOND STAGE Right upper arm (Right Arm Upper)     Patient location during evaluation: PACU Anesthesia Type: General Level of consciousness: awake and alert Pain management: pain level controlled Vital Signs Assessment: post-procedure vital signs reviewed and stable Respiratory status: spontaneous breathing, nonlabored ventilation and respiratory function stable Cardiovascular status: blood pressure returned to baseline and stable Postop Assessment: no apparent nausea or vomiting Anesthetic complications: no    Last Vitals:  Vitals:   10/10/18 1345 10/10/18 1406  BP: (!) 187/67 (!) 167/59  Pulse: (!) 52   Resp: 14   Temp:    SpO2: 95%     Last Pain:  Vitals:   10/10/18 1345  TempSrc:   PainSc: 0-No pain                 Lynda Rainwater

## 2018-10-11 ENCOUNTER — Encounter (HOSPITAL_COMMUNITY): Payer: Self-pay | Admitting: Surgery

## 2018-10-11 DIAGNOSIS — E1129 Type 2 diabetes mellitus with other diabetic kidney complication: Secondary | ICD-10-CM | POA: Diagnosis not present

## 2018-10-11 DIAGNOSIS — N2581 Secondary hyperparathyroidism of renal origin: Secondary | ICD-10-CM | POA: Diagnosis not present

## 2018-10-11 DIAGNOSIS — L989 Disorder of the skin and subcutaneous tissue, unspecified: Secondary | ICD-10-CM | POA: Diagnosis not present

## 2018-10-11 DIAGNOSIS — D689 Coagulation defect, unspecified: Secondary | ICD-10-CM | POA: Diagnosis not present

## 2018-10-11 DIAGNOSIS — D509 Iron deficiency anemia, unspecified: Secondary | ICD-10-CM | POA: Diagnosis not present

## 2018-10-11 DIAGNOSIS — Z452 Encounter for adjustment and management of vascular access device: Secondary | ICD-10-CM | POA: Diagnosis not present

## 2018-10-11 DIAGNOSIS — D631 Anemia in chronic kidney disease: Secondary | ICD-10-CM | POA: Diagnosis not present

## 2018-10-11 DIAGNOSIS — N186 End stage renal disease: Secondary | ICD-10-CM | POA: Diagnosis not present

## 2018-10-11 MED ORDER — PHENYLEPHRINE HCL-NACL 10-0.9 MG/250ML-% IV SOLN
INTRAVENOUS | Status: AC
  Filled 2018-10-11: qty 500

## 2018-10-14 ENCOUNTER — Telehealth: Payer: Self-pay | Admitting: *Deleted

## 2018-10-14 DIAGNOSIS — D631 Anemia in chronic kidney disease: Secondary | ICD-10-CM | POA: Diagnosis not present

## 2018-10-14 DIAGNOSIS — N186 End stage renal disease: Secondary | ICD-10-CM | POA: Diagnosis not present

## 2018-10-14 DIAGNOSIS — N2581 Secondary hyperparathyroidism of renal origin: Secondary | ICD-10-CM | POA: Diagnosis not present

## 2018-10-14 DIAGNOSIS — L989 Disorder of the skin and subcutaneous tissue, unspecified: Secondary | ICD-10-CM

## 2018-10-14 DIAGNOSIS — D509 Iron deficiency anemia, unspecified: Secondary | ICD-10-CM | POA: Diagnosis not present

## 2018-10-14 DIAGNOSIS — E1129 Type 2 diabetes mellitus with other diabetic kidney complication: Secondary | ICD-10-CM | POA: Diagnosis not present

## 2018-10-14 DIAGNOSIS — Z452 Encounter for adjustment and management of vascular access device: Secondary | ICD-10-CM | POA: Diagnosis not present

## 2018-10-14 DIAGNOSIS — D689 Coagulation defect, unspecified: Secondary | ICD-10-CM | POA: Diagnosis not present

## 2018-10-14 HISTORY — DX: Disorder of the skin and subcutaneous tissue, unspecified: L98.9

## 2018-10-14 NOTE — Telephone Encounter (Signed)
-----   Message from Marty Heck, MD sent at 10/12/2018 11:50 AM EDT ----- His dialysis center called today.  Concerned about some drainage from right 2nd stage basilic vein fistula with Dr. Trula Slade several days ago.  Discussed we could arrange wound check in NP or PA clinic early in the week.  Thanks,  Gerald Stabs

## 2018-10-15 ENCOUNTER — Other Ambulatory Visit: Payer: Self-pay

## 2018-10-15 ENCOUNTER — Ambulatory Visit (INDEPENDENT_AMBULATORY_CARE_PROVIDER_SITE_OTHER): Payer: Self-pay | Admitting: Physician Assistant

## 2018-10-15 VITALS — BP 178/66 | HR 72 | Temp 97.9°F | Resp 14 | Ht 72.0 in | Wt 194.4 lb

## 2018-10-15 DIAGNOSIS — N186 End stage renal disease: Secondary | ICD-10-CM

## 2018-10-15 DIAGNOSIS — Z992 Dependence on renal dialysis: Secondary | ICD-10-CM

## 2018-10-15 NOTE — Progress Notes (Signed)
POST OPERATIVE OFFICE NOTE    CC:  F/u for surgery  HPI:  This is a 68 y.o. male who is s/p 2nd stage right BVT on 10/10/2018 by Dr. Trula Slade.  The 1st stage was on 07/04/2018 by Dr. Trula Slade.  He presents today with c/o of drainage from his incision over the weekend.  He states that this has resolved.  He does have some swelling around the upper arm.  He states that when he is on dialysis, he gets some numbness in his hand, but this resolves quickly once he is off the machine.  He denies any pain in his hand.  He states his catheter was put in by Dr. Augustin Coupe.    Pt dialyzes in [place] M/W/F.   No Known Allergies  Current Outpatient Medications  Medication Sig Dispense Refill  . allopurinol (ZYLOPRIM) 100 MG tablet Take 100 mg by mouth at bedtime.     Marland Kitchen amiodarone (PACERONE) 200 MG tablet Take 1 tablet (200 mg total) by mouth daily. 90 tablet 1  . aspirin EC 81 MG tablet Take 81 mg by mouth daily.    Marland Kitchen atorvastatin (LIPITOR) 40 MG tablet Take 40 mg by mouth every evening.     . cloNIDine (CATAPRES) 0.1 MG tablet Take 1 tablet (0.1 mg) in the morning if SBP (top BP number) > 160 and take 1 tablet (0.1 mg) at bedtime. (Patient not taking: Reported on 10/08/2018) 180 tablet 0  . clopidogrel (PLAVIX) 75 MG tablet Take 75 mg by mouth daily with supper.     . hydrALAZINE (APRESOLINE) 25 MG tablet Take 1 tablet (25 mg) by mouth twice daily except for dialysis days only take 1 tablet in the evening. (Patient taking differently: Take 25-50 mg by mouth See admin instructions. Take 2 tablets (50 mg) by mouth in the evening on Mondays, Wednesdays, & Fridays. Take 1 tablet (25 mg) by mouth 3 times daily on Tuesdays, Thursdays, Saturdays, & Sundays.)    . insulin degludec (TRESIBA FLEXTOUCH) 100 UNIT/ML SOPN FlexTouch Pen Inject 15-30 Units into the skin See admin instructions. Inject 30 uinits (scheduled) every night, if blood sugar is greater than 130 in the morning then inject 15 units in the morning.    .  latanoprost (XALATAN) 0.005 % ophthalmic solution Place 1 drop into both eyes at bedtime.    . metoprolol tartrate (LOPRESSOR) 25 MG tablet Take 12.5 mg by mouth 2 (two) times daily. Take 2 tablets (50 mg) by mouth in the evening on Mondays, Wednesdays, & Fridays. Take 1 tablet (25 mg) by mouth 3 times daily on Tuesdays, Thursdays, Saturdays, & Sundays.    . nitroGLYCERIN (NITROSTAT) 0.4 MG SL tablet Place 1 tablet (0.4 mg total) under the tongue every 5 (five) minutes as needed for chest pain. 25 tablet 3  . oxyCODONE-acetaminophen (PERCOCET) 5-325 MG tablet Take 1 tablet by mouth every 4 (four) hours as needed for severe pain. 20 tablet 0  . oxyCODONE-acetaminophen (PERCOCET/ROXICET) 5-325 MG tablet Take 1 tablet by mouth every 4 (four) hours as needed for moderate pain or severe pain.     . polyethylene glycol (MIRALAX / GLYCOLAX) packet Take 17 g by mouth daily as needed for mild constipation.    Marland Kitchen PROAIR HFA 108 (90 Base) MCG/ACT inhaler Inhale 2 puffs into the lungs every 6 (six) hours as needed for wheezing or shortness of breath.   12   No current facility-administered medications for this visit.      ROS:  See HPI  Physical Exam:  Today's Vitals   10/15/18 1455  BP: (!) 178/66  Pulse: 72  Resp: 14  Temp: 97.9 F (36.6 C)  TempSrc: Temporal  SpO2: 97%  Weight: 194 lb 6.4 oz (88.2 kg)  Height: 6' (1.829 m)  PainSc: 5    Body mass index is 26.37 kg/m.   Incision:  Both are healing nicely. Extremities:  +palpable right radial pulse; hand grips strong bilaterally.  Motor and sensation are in tact.  Excellent thrill/bruit within fistula.    Assessment/Plan:  This is a 68 y.o. male who is s/p: 2nd stage right BVT on 10/10/2018 by Dr. Trula Slade.  -fistula is easily palpable with excellent thrill. -will give the fistula another month to allow the swelling in the upper arm to subside and then may start using at that time.  -once the fistula has been used a couple of times to the  satisfaction of the nephrologist, can schedule to have catheter removed (placed by [facility]). -will f/u as needed.     Leontine Locket, PA-C [facility] [phone]  Clinic MD:  Early

## 2018-10-16 DIAGNOSIS — D689 Coagulation defect, unspecified: Secondary | ICD-10-CM | POA: Diagnosis not present

## 2018-10-16 DIAGNOSIS — D631 Anemia in chronic kidney disease: Secondary | ICD-10-CM | POA: Diagnosis not present

## 2018-10-16 DIAGNOSIS — N186 End stage renal disease: Secondary | ICD-10-CM | POA: Diagnosis not present

## 2018-10-16 DIAGNOSIS — L989 Disorder of the skin and subcutaneous tissue, unspecified: Secondary | ICD-10-CM | POA: Diagnosis not present

## 2018-10-16 DIAGNOSIS — Z452 Encounter for adjustment and management of vascular access device: Secondary | ICD-10-CM | POA: Diagnosis not present

## 2018-10-16 DIAGNOSIS — N2581 Secondary hyperparathyroidism of renal origin: Secondary | ICD-10-CM | POA: Diagnosis not present

## 2018-10-16 DIAGNOSIS — E1129 Type 2 diabetes mellitus with other diabetic kidney complication: Secondary | ICD-10-CM | POA: Diagnosis not present

## 2018-10-16 DIAGNOSIS — D509 Iron deficiency anemia, unspecified: Secondary | ICD-10-CM | POA: Diagnosis not present

## 2018-10-17 DIAGNOSIS — D509 Iron deficiency anemia, unspecified: Secondary | ICD-10-CM | POA: Diagnosis not present

## 2018-10-17 DIAGNOSIS — K219 Gastro-esophageal reflux disease without esophagitis: Secondary | ICD-10-CM | POA: Diagnosis not present

## 2018-10-17 DIAGNOSIS — I10 Essential (primary) hypertension: Secondary | ICD-10-CM | POA: Diagnosis not present

## 2018-10-17 DIAGNOSIS — E1140 Type 2 diabetes mellitus with diabetic neuropathy, unspecified: Secondary | ICD-10-CM | POA: Diagnosis not present

## 2018-10-17 DIAGNOSIS — G4700 Insomnia, unspecified: Secondary | ICD-10-CM | POA: Diagnosis not present

## 2018-10-18 DIAGNOSIS — D631 Anemia in chronic kidney disease: Secondary | ICD-10-CM | POA: Diagnosis not present

## 2018-10-18 DIAGNOSIS — N2581 Secondary hyperparathyroidism of renal origin: Secondary | ICD-10-CM | POA: Diagnosis not present

## 2018-10-18 DIAGNOSIS — Z452 Encounter for adjustment and management of vascular access device: Secondary | ICD-10-CM | POA: Diagnosis not present

## 2018-10-18 DIAGNOSIS — L989 Disorder of the skin and subcutaneous tissue, unspecified: Secondary | ICD-10-CM | POA: Diagnosis not present

## 2018-10-18 DIAGNOSIS — D509 Iron deficiency anemia, unspecified: Secondary | ICD-10-CM | POA: Diagnosis not present

## 2018-10-18 DIAGNOSIS — D689 Coagulation defect, unspecified: Secondary | ICD-10-CM | POA: Diagnosis not present

## 2018-10-18 DIAGNOSIS — N186 End stage renal disease: Secondary | ICD-10-CM | POA: Diagnosis not present

## 2018-10-18 DIAGNOSIS — E1129 Type 2 diabetes mellitus with other diabetic kidney complication: Secondary | ICD-10-CM | POA: Diagnosis not present

## 2018-10-21 ENCOUNTER — Telehealth: Payer: Self-pay | Admitting: Cardiology

## 2018-10-21 DIAGNOSIS — L989 Disorder of the skin and subcutaneous tissue, unspecified: Secondary | ICD-10-CM | POA: Diagnosis not present

## 2018-10-21 DIAGNOSIS — D631 Anemia in chronic kidney disease: Secondary | ICD-10-CM | POA: Diagnosis not present

## 2018-10-21 DIAGNOSIS — D509 Iron deficiency anemia, unspecified: Secondary | ICD-10-CM | POA: Diagnosis not present

## 2018-10-21 DIAGNOSIS — N186 End stage renal disease: Secondary | ICD-10-CM | POA: Diagnosis not present

## 2018-10-21 DIAGNOSIS — Z452 Encounter for adjustment and management of vascular access device: Secondary | ICD-10-CM | POA: Diagnosis not present

## 2018-10-21 DIAGNOSIS — E1129 Type 2 diabetes mellitus with other diabetic kidney complication: Secondary | ICD-10-CM | POA: Diagnosis not present

## 2018-10-21 DIAGNOSIS — N2581 Secondary hyperparathyroidism of renal origin: Secondary | ICD-10-CM | POA: Diagnosis not present

## 2018-10-21 DIAGNOSIS — D689 Coagulation defect, unspecified: Secondary | ICD-10-CM | POA: Diagnosis not present

## 2018-10-21 NOTE — Telephone Encounter (Signed)
Patient wants to know what he needs to do in regards to his HR staying in the mid 40's, sometimes low 50's  Please call patient to discuss [phone]  Patient is aware BJM is out of the office today and it will probably be tomorrow before he receives a call back

## 2018-10-21 NOTE — Telephone Encounter (Signed)
Pt returned call, requesting we speak with HD RN. Marolyn Hammock at [facility] HD who will fax notes to [facility] office for Dr. Bettina Gavia to review.

## 2018-10-21 NOTE — Telephone Encounter (Signed)
Returned patient's call. States HR's have been low for 2 months ranging from high 40's-low 50's. Reports feeling fatigued and not well. BP's range 130/50-160/55. Reconciled medication doses which don't match what's in our records. Reports residual ankle edema and SOB with exertion. Denies chest pain, tightness or pressure.  After call ended, noted previous encounter that HD MD had been adjusting medication as well as Dr. Bettina Gavia. Dr. Bettina Gavia requested HD MD take over managing medications so there would be no overlap or confusion.  Phoned pt back, left voice message, requested return call

## 2018-10-22 DIAGNOSIS — E1122 Type 2 diabetes mellitus with diabetic chronic kidney disease: Secondary | ICD-10-CM | POA: Diagnosis not present

## 2018-10-22 DIAGNOSIS — N186 End stage renal disease: Secondary | ICD-10-CM | POA: Diagnosis not present

## 2018-10-22 DIAGNOSIS — Z992 Dependence on renal dialysis: Secondary | ICD-10-CM | POA: Diagnosis not present

## 2018-10-22 NOTE — Telephone Encounter (Signed)
1.  What dose of metoprolol is he taking?  2.  If taking twice daily document the dose and reduce to once daily  3.  I reviewed the records from dialysis center he is having mild bradycardia with heart rates of 49 to 55 bpm

## 2018-10-23 DIAGNOSIS — D509 Iron deficiency anemia, unspecified: Secondary | ICD-10-CM | POA: Diagnosis not present

## 2018-10-23 DIAGNOSIS — D689 Coagulation defect, unspecified: Secondary | ICD-10-CM | POA: Diagnosis not present

## 2018-10-23 DIAGNOSIS — N186 End stage renal disease: Secondary | ICD-10-CM | POA: Diagnosis not present

## 2018-10-23 DIAGNOSIS — Z452 Encounter for adjustment and management of vascular access device: Secondary | ICD-10-CM | POA: Diagnosis not present

## 2018-10-23 DIAGNOSIS — E1129 Type 2 diabetes mellitus with other diabetic kidney complication: Secondary | ICD-10-CM | POA: Diagnosis not present

## 2018-10-23 DIAGNOSIS — D631 Anemia in chronic kidney disease: Secondary | ICD-10-CM | POA: Diagnosis not present

## 2018-10-23 DIAGNOSIS — N2581 Secondary hyperparathyroidism of renal origin: Secondary | ICD-10-CM | POA: Diagnosis not present

## 2018-10-25 DIAGNOSIS — N186 End stage renal disease: Secondary | ICD-10-CM | POA: Diagnosis not present

## 2018-10-25 DIAGNOSIS — N2581 Secondary hyperparathyroidism of renal origin: Secondary | ICD-10-CM | POA: Diagnosis not present

## 2018-10-25 DIAGNOSIS — E1129 Type 2 diabetes mellitus with other diabetic kidney complication: Secondary | ICD-10-CM | POA: Diagnosis not present

## 2018-10-25 DIAGNOSIS — Z452 Encounter for adjustment and management of vascular access device: Secondary | ICD-10-CM | POA: Diagnosis not present

## 2018-10-25 DIAGNOSIS — D509 Iron deficiency anemia, unspecified: Secondary | ICD-10-CM | POA: Diagnosis not present

## 2018-10-25 DIAGNOSIS — D689 Coagulation defect, unspecified: Secondary | ICD-10-CM | POA: Diagnosis not present

## 2018-10-25 DIAGNOSIS — D631 Anemia in chronic kidney disease: Secondary | ICD-10-CM | POA: Diagnosis not present

## 2018-10-28 DIAGNOSIS — D509 Iron deficiency anemia, unspecified: Secondary | ICD-10-CM | POA: Diagnosis not present

## 2018-10-28 DIAGNOSIS — N2581 Secondary hyperparathyroidism of renal origin: Secondary | ICD-10-CM | POA: Diagnosis not present

## 2018-10-28 DIAGNOSIS — D689 Coagulation defect, unspecified: Secondary | ICD-10-CM | POA: Diagnosis not present

## 2018-10-28 DIAGNOSIS — E1129 Type 2 diabetes mellitus with other diabetic kidney complication: Secondary | ICD-10-CM | POA: Diagnosis not present

## 2018-10-28 DIAGNOSIS — N186 End stage renal disease: Secondary | ICD-10-CM | POA: Diagnosis not present

## 2018-10-28 DIAGNOSIS — Z452 Encounter for adjustment and management of vascular access device: Secondary | ICD-10-CM | POA: Diagnosis not present

## 2018-10-28 DIAGNOSIS — D631 Anemia in chronic kidney disease: Secondary | ICD-10-CM | POA: Diagnosis not present

## 2018-10-30 DIAGNOSIS — D631 Anemia in chronic kidney disease: Secondary | ICD-10-CM | POA: Diagnosis not present

## 2018-10-30 DIAGNOSIS — N186 End stage renal disease: Secondary | ICD-10-CM | POA: Diagnosis not present

## 2018-10-30 DIAGNOSIS — D509 Iron deficiency anemia, unspecified: Secondary | ICD-10-CM | POA: Diagnosis not present

## 2018-10-30 DIAGNOSIS — N2581 Secondary hyperparathyroidism of renal origin: Secondary | ICD-10-CM | POA: Diagnosis not present

## 2018-10-30 DIAGNOSIS — E1129 Type 2 diabetes mellitus with other diabetic kidney complication: Secondary | ICD-10-CM | POA: Diagnosis not present

## 2018-10-30 DIAGNOSIS — D689 Coagulation defect, unspecified: Secondary | ICD-10-CM | POA: Diagnosis not present

## 2018-10-30 DIAGNOSIS — Z452 Encounter for adjustment and management of vascular access device: Secondary | ICD-10-CM | POA: Diagnosis not present

## 2018-11-01 DIAGNOSIS — D509 Iron deficiency anemia, unspecified: Secondary | ICD-10-CM | POA: Diagnosis not present

## 2018-11-01 DIAGNOSIS — Z452 Encounter for adjustment and management of vascular access device: Secondary | ICD-10-CM | POA: Diagnosis not present

## 2018-11-01 DIAGNOSIS — D631 Anemia in chronic kidney disease: Secondary | ICD-10-CM | POA: Diagnosis not present

## 2018-11-01 DIAGNOSIS — N186 End stage renal disease: Secondary | ICD-10-CM | POA: Diagnosis not present

## 2018-11-01 DIAGNOSIS — N2581 Secondary hyperparathyroidism of renal origin: Secondary | ICD-10-CM | POA: Diagnosis not present

## 2018-11-01 DIAGNOSIS — E1129 Type 2 diabetes mellitus with other diabetic kidney complication: Secondary | ICD-10-CM | POA: Diagnosis not present

## 2018-11-01 DIAGNOSIS — D689 Coagulation defect, unspecified: Secondary | ICD-10-CM | POA: Diagnosis not present

## 2018-11-04 DIAGNOSIS — N186 End stage renal disease: Secondary | ICD-10-CM | POA: Diagnosis not present

## 2018-11-04 DIAGNOSIS — D689 Coagulation defect, unspecified: Secondary | ICD-10-CM | POA: Diagnosis not present

## 2018-11-04 DIAGNOSIS — D631 Anemia in chronic kidney disease: Secondary | ICD-10-CM | POA: Diagnosis not present

## 2018-11-04 DIAGNOSIS — E1129 Type 2 diabetes mellitus with other diabetic kidney complication: Secondary | ICD-10-CM | POA: Diagnosis not present

## 2018-11-04 DIAGNOSIS — N2581 Secondary hyperparathyroidism of renal origin: Secondary | ICD-10-CM | POA: Diagnosis not present

## 2018-11-04 DIAGNOSIS — Z452 Encounter for adjustment and management of vascular access device: Secondary | ICD-10-CM | POA: Diagnosis not present

## 2018-11-04 DIAGNOSIS — D509 Iron deficiency anemia, unspecified: Secondary | ICD-10-CM | POA: Diagnosis not present

## 2018-11-06 DIAGNOSIS — E1129 Type 2 diabetes mellitus with other diabetic kidney complication: Secondary | ICD-10-CM | POA: Diagnosis not present

## 2018-11-06 DIAGNOSIS — Z452 Encounter for adjustment and management of vascular access device: Secondary | ICD-10-CM | POA: Diagnosis not present

## 2018-11-06 DIAGNOSIS — D509 Iron deficiency anemia, unspecified: Secondary | ICD-10-CM | POA: Diagnosis not present

## 2018-11-06 DIAGNOSIS — N2581 Secondary hyperparathyroidism of renal origin: Secondary | ICD-10-CM | POA: Diagnosis not present

## 2018-11-06 DIAGNOSIS — D631 Anemia in chronic kidney disease: Secondary | ICD-10-CM | POA: Diagnosis not present

## 2018-11-06 DIAGNOSIS — D689 Coagulation defect, unspecified: Secondary | ICD-10-CM | POA: Diagnosis not present

## 2018-11-06 DIAGNOSIS — N186 End stage renal disease: Secondary | ICD-10-CM | POA: Diagnosis not present

## 2018-11-08 DIAGNOSIS — N186 End stage renal disease: Secondary | ICD-10-CM | POA: Diagnosis not present

## 2018-11-08 DIAGNOSIS — D631 Anemia in chronic kidney disease: Secondary | ICD-10-CM | POA: Diagnosis not present

## 2018-11-08 DIAGNOSIS — D509 Iron deficiency anemia, unspecified: Secondary | ICD-10-CM | POA: Diagnosis not present

## 2018-11-08 DIAGNOSIS — E1129 Type 2 diabetes mellitus with other diabetic kidney complication: Secondary | ICD-10-CM | POA: Diagnosis not present

## 2018-11-08 DIAGNOSIS — N2581 Secondary hyperparathyroidism of renal origin: Secondary | ICD-10-CM | POA: Diagnosis not present

## 2018-11-08 DIAGNOSIS — D689 Coagulation defect, unspecified: Secondary | ICD-10-CM | POA: Diagnosis not present

## 2018-11-08 DIAGNOSIS — Z452 Encounter for adjustment and management of vascular access device: Secondary | ICD-10-CM | POA: Diagnosis not present

## 2018-11-11 DIAGNOSIS — N2581 Secondary hyperparathyroidism of renal origin: Secondary | ICD-10-CM | POA: Diagnosis not present

## 2018-11-11 DIAGNOSIS — N186 End stage renal disease: Secondary | ICD-10-CM | POA: Diagnosis not present

## 2018-11-11 DIAGNOSIS — D509 Iron deficiency anemia, unspecified: Secondary | ICD-10-CM | POA: Diagnosis not present

## 2018-11-11 DIAGNOSIS — Z452 Encounter for adjustment and management of vascular access device: Secondary | ICD-10-CM | POA: Diagnosis not present

## 2018-11-11 DIAGNOSIS — D689 Coagulation defect, unspecified: Secondary | ICD-10-CM | POA: Diagnosis not present

## 2018-11-11 DIAGNOSIS — D631 Anemia in chronic kidney disease: Secondary | ICD-10-CM | POA: Diagnosis not present

## 2018-11-11 DIAGNOSIS — E1129 Type 2 diabetes mellitus with other diabetic kidney complication: Secondary | ICD-10-CM | POA: Diagnosis not present

## 2018-11-13 DIAGNOSIS — E1129 Type 2 diabetes mellitus with other diabetic kidney complication: Secondary | ICD-10-CM | POA: Diagnosis not present

## 2018-11-13 DIAGNOSIS — D689 Coagulation defect, unspecified: Secondary | ICD-10-CM | POA: Diagnosis not present

## 2018-11-13 DIAGNOSIS — Z452 Encounter for adjustment and management of vascular access device: Secondary | ICD-10-CM | POA: Diagnosis not present

## 2018-11-13 DIAGNOSIS — D509 Iron deficiency anemia, unspecified: Secondary | ICD-10-CM | POA: Diagnosis not present

## 2018-11-13 DIAGNOSIS — N2581 Secondary hyperparathyroidism of renal origin: Secondary | ICD-10-CM | POA: Diagnosis not present

## 2018-11-13 DIAGNOSIS — D631 Anemia in chronic kidney disease: Secondary | ICD-10-CM | POA: Diagnosis not present

## 2018-11-13 DIAGNOSIS — N186 End stage renal disease: Secondary | ICD-10-CM | POA: Diagnosis not present

## 2018-11-14 DIAGNOSIS — I10 Essential (primary) hypertension: Secondary | ICD-10-CM | POA: Diagnosis not present

## 2018-11-14 DIAGNOSIS — D509 Iron deficiency anemia, unspecified: Secondary | ICD-10-CM | POA: Diagnosis not present

## 2018-11-14 DIAGNOSIS — E1140 Type 2 diabetes mellitus with diabetic neuropathy, unspecified: Secondary | ICD-10-CM | POA: Diagnosis not present

## 2018-11-14 DIAGNOSIS — K219 Gastro-esophageal reflux disease without esophagitis: Secondary | ICD-10-CM | POA: Diagnosis not present

## 2018-11-14 DIAGNOSIS — G4700 Insomnia, unspecified: Secondary | ICD-10-CM | POA: Diagnosis not present

## 2018-11-15 DIAGNOSIS — Z452 Encounter for adjustment and management of vascular access device: Secondary | ICD-10-CM | POA: Diagnosis not present

## 2018-11-15 DIAGNOSIS — E1129 Type 2 diabetes mellitus with other diabetic kidney complication: Secondary | ICD-10-CM | POA: Diagnosis not present

## 2018-11-15 DIAGNOSIS — D509 Iron deficiency anemia, unspecified: Secondary | ICD-10-CM | POA: Diagnosis not present

## 2018-11-15 DIAGNOSIS — D631 Anemia in chronic kidney disease: Secondary | ICD-10-CM | POA: Diagnosis not present

## 2018-11-15 DIAGNOSIS — N2581 Secondary hyperparathyroidism of renal origin: Secondary | ICD-10-CM | POA: Diagnosis not present

## 2018-11-15 DIAGNOSIS — N186 End stage renal disease: Secondary | ICD-10-CM | POA: Diagnosis not present

## 2018-11-15 DIAGNOSIS — D689 Coagulation defect, unspecified: Secondary | ICD-10-CM | POA: Diagnosis not present

## 2018-11-18 DIAGNOSIS — D509 Iron deficiency anemia, unspecified: Secondary | ICD-10-CM | POA: Diagnosis not present

## 2018-11-18 DIAGNOSIS — N2581 Secondary hyperparathyroidism of renal origin: Secondary | ICD-10-CM | POA: Diagnosis not present

## 2018-11-18 DIAGNOSIS — E1129 Type 2 diabetes mellitus with other diabetic kidney complication: Secondary | ICD-10-CM | POA: Diagnosis not present

## 2018-11-18 DIAGNOSIS — Z452 Encounter for adjustment and management of vascular access device: Secondary | ICD-10-CM | POA: Diagnosis not present

## 2018-11-18 DIAGNOSIS — D631 Anemia in chronic kidney disease: Secondary | ICD-10-CM | POA: Diagnosis not present

## 2018-11-18 DIAGNOSIS — N186 End stage renal disease: Secondary | ICD-10-CM | POA: Diagnosis not present

## 2018-11-18 DIAGNOSIS — D689 Coagulation defect, unspecified: Secondary | ICD-10-CM | POA: Diagnosis not present

## 2018-11-20 DIAGNOSIS — Z452 Encounter for adjustment and management of vascular access device: Secondary | ICD-10-CM | POA: Diagnosis not present

## 2018-11-20 DIAGNOSIS — N186 End stage renal disease: Secondary | ICD-10-CM | POA: Diagnosis not present

## 2018-11-20 DIAGNOSIS — D631 Anemia in chronic kidney disease: Secondary | ICD-10-CM | POA: Diagnosis not present

## 2018-11-20 DIAGNOSIS — E1129 Type 2 diabetes mellitus with other diabetic kidney complication: Secondary | ICD-10-CM | POA: Diagnosis not present

## 2018-11-20 DIAGNOSIS — N2581 Secondary hyperparathyroidism of renal origin: Secondary | ICD-10-CM | POA: Diagnosis not present

## 2018-11-20 DIAGNOSIS — D689 Coagulation defect, unspecified: Secondary | ICD-10-CM | POA: Diagnosis not present

## 2018-11-20 DIAGNOSIS — D509 Iron deficiency anemia, unspecified: Secondary | ICD-10-CM | POA: Diagnosis not present

## 2018-11-22 ENCOUNTER — Encounter: Payer: Self-pay | Admitting: Sports Medicine

## 2018-11-22 ENCOUNTER — Ambulatory Visit (INDEPENDENT_AMBULATORY_CARE_PROVIDER_SITE_OTHER): Payer: Medicare Other | Admitting: Sports Medicine

## 2018-11-22 ENCOUNTER — Other Ambulatory Visit: Payer: Self-pay

## 2018-11-22 VITALS — Temp 98.0°F | Resp 16

## 2018-11-22 DIAGNOSIS — E1142 Type 2 diabetes mellitus with diabetic polyneuropathy: Secondary | ICD-10-CM

## 2018-11-22 DIAGNOSIS — Z992 Dependence on renal dialysis: Secondary | ICD-10-CM | POA: Diagnosis not present

## 2018-11-22 DIAGNOSIS — M79674 Pain in right toe(s): Secondary | ICD-10-CM

## 2018-11-22 DIAGNOSIS — N2581 Secondary hyperparathyroidism of renal origin: Secondary | ICD-10-CM | POA: Diagnosis not present

## 2018-11-22 DIAGNOSIS — D689 Coagulation defect, unspecified: Secondary | ICD-10-CM | POA: Diagnosis not present

## 2018-11-22 DIAGNOSIS — D631 Anemia in chronic kidney disease: Secondary | ICD-10-CM | POA: Diagnosis not present

## 2018-11-22 DIAGNOSIS — M2041 Other hammer toe(s) (acquired), right foot: Secondary | ICD-10-CM

## 2018-11-22 DIAGNOSIS — M2042 Other hammer toe(s) (acquired), left foot: Secondary | ICD-10-CM

## 2018-11-22 DIAGNOSIS — E1129 Type 2 diabetes mellitus with other diabetic kidney complication: Secondary | ICD-10-CM | POA: Diagnosis not present

## 2018-11-22 DIAGNOSIS — B351 Tinea unguium: Secondary | ICD-10-CM

## 2018-11-22 DIAGNOSIS — D509 Iron deficiency anemia, unspecified: Secondary | ICD-10-CM | POA: Diagnosis not present

## 2018-11-22 DIAGNOSIS — N186 End stage renal disease: Secondary | ICD-10-CM

## 2018-11-22 DIAGNOSIS — E1122 Type 2 diabetes mellitus with diabetic chronic kidney disease: Secondary | ICD-10-CM | POA: Diagnosis not present

## 2018-11-22 DIAGNOSIS — M79675 Pain in left toe(s): Secondary | ICD-10-CM | POA: Diagnosis not present

## 2018-11-22 DIAGNOSIS — Z452 Encounter for adjustment and management of vascular access device: Secondary | ICD-10-CM | POA: Diagnosis not present

## 2018-11-22 NOTE — Progress Notes (Signed)
Subjective: James Chan is a 68 y.o. male patient with history of diabetes who presents to office today complaining of long, painful nails  while ambulating in shoes; unable to trim. Patient states that the glucose reading this morning was 132 mg/dl dropped to 91 after dialysis, last A1C 4.7. Patient denies any new changes in medication or new problems.   Patient Active Problem List   Diagnosis Date Noted  . ESRD on dialysis ([place]) 10/07/2018  . Mitral regurgitation 02/05/2018  . Bradycardia 07/02/2017  . Iron deficiency anemia 07/02/2017  . CKD (chronic kidney disease) 04/11/2017  . PAF (paroxysmal atrial fibrillation) ([place]) 02/27/2017  . Chronic combined systolic and diastolic heart failure ([place]) 11/28/2016  . Hypertensive heart and chronic kidney disease with heart failure and stage 1 through stage 4 chronic kidney disease, or chronic kidney disease ([place]) 11/28/2016  . On amiodarone therapy 11/28/2016  . Chronic anticoagulation 11/28/2016  . Erectile dysfunction 11/28/2016  . Arterial insufficiency of lower extremity ([place]) 05/10/2016  . Coronary artery disease involving native coronary artery of native heart without angina pectoris 11/24/2015  . Dyslipidemia 11/24/2015  . Diabetic polyneuropathy associated with diabetes mellitus due to underlying condition ([place]) 06/28/2015  . Hammer toes of both feet 06/28/2015  . Onychomycosis due to dermatophyte 06/28/2015  . Type 2 diabetes, controlled, with peripheral circulatory disorder ([place]) 06/28/2015  . Claudication ([place]) 10/30/2014  . History of carotid artery stenosis 10/30/2014  . 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 PAD (peripheral artery disease) ([place]) 10/30/2014  . Comprehensive diabetic foot examination, type 2 DM, encounter for ([place]) 09/15/2008  . HYPERTRIGLYCERIDEMIA 09/15/2008  . ANXIETY STATE, UNSPECIFIED 09/15/2008  . DEPRESSIVE DISORDER 09/15/2008  . NSTEMI (non-ST elevated myocardial infarction)  ([place]) 09/15/2008  . ESOPHAGEAL REFLUX 09/15/2008  . BACK PAIN, LUMBAR, CHRONIC 09/15/2008   Current Outpatient Medications on File Prior to Visit  Medication Sig Dispense Refill  . allopurinol (ZYLOPRIM) 100 MG tablet Take 100 mg by mouth at bedtime.     Marland Kitchen amiodarone (PACERONE) 200 MG tablet Take 1 tablet (200 mg total) by mouth daily. 90 tablet 1  . aspirin EC 81 MG tablet Take 81 mg by mouth daily.    Marland Kitchen atorvastatin (LIPITOR) 40 MG tablet Take 40 mg by mouth every evening.     . cloNIDine (CATAPRES) 0.1 MG tablet Take 1 tablet (0.1 mg) in the morning if SBP (top BP number) > 160 and take 1 tablet (0.1 mg) at bedtime. 180 tablet 0  . clopidogrel (PLAVIX) 75 MG tablet Take 75 mg by mouth daily with supper.     . hydrALAZINE (APRESOLINE) 25 MG tablet Take 1 tablet (25 mg) by mouth twice daily except for dialysis days only take 1 tablet in the evening. (Patient taking differently: Take 25-50 mg by mouth See admin instructions. Take 2 tablets (50 mg) by mouth in the evening on Mondays, Wednesdays, & Fridays. Take 1 tablet (25 mg) by mouth 3 times daily on Tuesdays, Thursdays, Saturdays, & Sundays.)    . insulin degludec (TRESIBA FLEXTOUCH) 100 UNIT/ML SOPN FlexTouch Pen Inject 15-30 Units into the skin See admin instructions. Inject 30 uinits (scheduled) every night, if blood sugar is greater than 130 in the morning then inject 15 units in the morning.    . latanoprost (XALATAN) 0.005 % ophthalmic solution Place 1 drop into both eyes at bedtime.    . metoprolol tartrate (LOPRESSOR) 25 MG tablet Take 12.5 mg by mouth 2 (two) times daily. Take 2 tablets (  50 mg) by mouth in the evening on Mondays, Wednesdays, & Fridays. Take 1 tablet (25 mg) by mouth 3 times daily on Tuesdays, Thursdays, Saturdays, & Sundays.    . nitroGLYCERIN (NITROSTAT) 0.4 MG SL tablet Place 1 tablet (0.4 mg total) under the tongue every 5 (five) minutes as needed for chest pain. 25 tablet 3  . oxyCODONE-acetaminophen (PERCOCET)  5-325 MG tablet Take 1 tablet by mouth every 4 (four) hours as needed for severe pain. 20 tablet 0  . polyethylene glycol (MIRALAX / GLYCOLAX) packet Take 17 g by mouth daily as needed for mild constipation.    Marland Kitchen PROAIR HFA 108 (90 Base) MCG/ACT inhaler Inhale 2 puffs into the lungs every 6 (six) hours as needed for wheezing or shortness of breath.   12   No current facility-administered medications on file prior to visit.    No Known Allergies  Recent Results (from the past 2160 hour(s))  Glucose, capillary     Status: None   Collection Time: 10/10/18  5:58 AM  Result Value Ref Range   Glucose-Capillary 75 70 - 99 mg/dL  SARS Coronavirus 2     Status: None   Collection Time: 10/10/18  6:20 AM  Result Value Ref Range   SARS Coronavirus 2 NOT DETECTED NOT DETECTED    Comment: (NOTE) SARS-CoV-2 target nucleic acids are NOT DETECTED. The SARS-CoV-2 RNA is generally detectable in upper and lower respiratory specimens during the acute phase of infection.  Negative  results do not preclude SARS-CoV-2 infection, do not rule out co-infections with other pathogens, and should not be used as the sole basis for treatment or other patient management decisions.  Negative results must be combined with clinical observations, patient history, and epidemiological information. The expected result is Not Detected. Fact Sheet for Patients: http://www.biofiredefense.com/wp-content/uploads/2020/03/BIOFIRE-COVID -19-patients.pdf Fact Sheet for Healthcare Providers: http://www.biofiredefense.com/wp-content/uploads/2020/03/BIOFIRE-COVID -19-hcp.pdf This test is not yet approved or cleared by the [place] and  has been authorized for detection and/or diagnosis of SARS-CoV-2 by FDA under an Emergency Use Authorization (EUA).  This EUA will remain in effec t (meaning this test can be used) for the duration of  the COVID-19 declaration under Section 564(b)(1) of the Act, 21 U.S.C. section  360bbb-3(b)(1), unless the authorization is terminated or revoked sooner. Performed at [facility], [place] [address] [place], [place] [address]   I-STAT 4, (NA,K, GLUC, HGB,HCT)     Status: Abnormal   Collection Time: 10/10/18  7:57 AM  Result Value Ref Range   Sodium 137 135 - 145 mmol/L   Potassium 3.7 3.5 - 5.1 mmol/L   Glucose, Bld 58 (L) 70 - 99 mg/dL   HCT 30.0 (L) 39.0 - 52.0 %   Hemoglobin 10.2 (L) 13.0 - 17.0 g/dL  Glucose, capillary     Status: Abnormal   Collection Time: 10/10/18  8:42 AM  Result Value Ref Range   Glucose-Capillary 113 (H) 70 - 99 mg/dL  Glucose, capillary     Status: Abnormal   Collection Time: 10/10/18  9:42 AM  Result Value Ref Range   Glucose-Capillary 60 (L) 70 - 99 mg/dL  Glucose, capillary     Status: None   Collection Time: 10/10/18 10:16 AM  Result Value Ref Range   Glucose-Capillary 95 70 - 99 mg/dL  Glucose, capillary     Status: Abnormal   Collection Time: 10/10/18 12:46 PM  Result Value Ref Range   Glucose-Capillary 65 (L) 70 - 99 mg/dL  Glucose, capillary  Status: Abnormal   Collection Time: 10/10/18  1:17 PM  Result Value Ref Range   Glucose-Capillary 67 (L) 70 - 99 mg/dL  Glucose, capillary     Status: Abnormal   Collection Time: 10/10/18  1:38 PM  Result Value Ref Range   Glucose-Capillary 68 (L) 70 - 99 mg/dL   Comment 1 Notify RN   Glucose, capillary     Status: Abnormal   Collection Time: 10/10/18  2:17 PM  Result Value Ref Range   Glucose-Capillary 167 (H) 70 - 99 mg/dL   Comment 1 Notify RN    Comment 2 Document in Chart     Objective: General: Patient is awake, alert, and oriented x 3 and in no acute distress.  Integument: Skin is warm, dry and supple bilateral. Nails are tender, long, thickened and  dystrophic with subungual debris, consistent with onychomycosis, 1-5 bilateral. No signs of infection. No open lesions or preulcerative lesions present bilateral. Remaining integument  unremarkable.  Vasculature:  Dorsalis Pedis pulse 1/4 bilateral. Posterior Tibial pulse  1/4 bilateral.  Capillary fill time <3 sec 1-5 bilateral. Positive hair growth to the level of the digits. Temperature gradient within normal limits. No varicosities present bilateral. No edema present bilateral.   Neurology: The patient has absent sensation measured with a 5.07/10g Semmes Weinstein Monofilament at all pedal sites bilateral. Vibratory sensation absent bilateral with tuning fork. No Babinski sign present bilateral.   Musculoskeletal: Hammertoe pedal deformities noted bilateral. Muscular strength 5/5 in all lower extremity muscular groups bilateral without pain on range of motion . No tenderness with calf compression bilateral.  Assessment and Plan: Problem List Items Addressed This Visit      Musculoskeletal and Integument   Hammer toes of both feet     Genitourinary   ESRD on dialysis ([place])    Other Visit Diagnoses    Pain due to onychomycosis of toenails of both feet    -  Primary   Diabetic polyneuropathy associated with type 2 diabetes mellitus ([place])         -Examined patient. -Discussed and educated patient on diabetic foot care, especially with  regards to the vascular, neurological and musculoskeletal systems.  -Mechanically debrided all nails 1-5 bilateral using sterile nail nipper and filed with dremel without incident  -Answered all patient questions -Patient to return  in 3 months for at risk foot care -Patient advised to call the office if any problems or questions arise in the meantime.  Landis Martins, DPM

## 2018-11-25 DIAGNOSIS — Z992 Dependence on renal dialysis: Secondary | ICD-10-CM | POA: Diagnosis not present

## 2018-11-25 DIAGNOSIS — N186 End stage renal disease: Secondary | ICD-10-CM | POA: Diagnosis not present

## 2018-11-25 DIAGNOSIS — N2581 Secondary hyperparathyroidism of renal origin: Secondary | ICD-10-CM | POA: Diagnosis not present

## 2018-11-25 DIAGNOSIS — D631 Anemia in chronic kidney disease: Secondary | ICD-10-CM | POA: Diagnosis not present

## 2018-11-25 DIAGNOSIS — Z452 Encounter for adjustment and management of vascular access device: Secondary | ICD-10-CM | POA: Diagnosis not present

## 2018-11-25 DIAGNOSIS — E1129 Type 2 diabetes mellitus with other diabetic kidney complication: Secondary | ICD-10-CM | POA: Diagnosis not present

## 2018-11-25 DIAGNOSIS — D689 Coagulation defect, unspecified: Secondary | ICD-10-CM | POA: Diagnosis not present

## 2018-11-25 DIAGNOSIS — D509 Iron deficiency anemia, unspecified: Secondary | ICD-10-CM | POA: Diagnosis not present

## 2018-11-27 DIAGNOSIS — Z992 Dependence on renal dialysis: Secondary | ICD-10-CM | POA: Diagnosis not present

## 2018-11-27 DIAGNOSIS — E1129 Type 2 diabetes mellitus with other diabetic kidney complication: Secondary | ICD-10-CM | POA: Diagnosis not present

## 2018-11-27 DIAGNOSIS — D689 Coagulation defect, unspecified: Secondary | ICD-10-CM | POA: Diagnosis not present

## 2018-11-27 DIAGNOSIS — N2581 Secondary hyperparathyroidism of renal origin: Secondary | ICD-10-CM | POA: Diagnosis not present

## 2018-11-27 DIAGNOSIS — D631 Anemia in chronic kidney disease: Secondary | ICD-10-CM | POA: Diagnosis not present

## 2018-11-27 DIAGNOSIS — D509 Iron deficiency anemia, unspecified: Secondary | ICD-10-CM | POA: Diagnosis not present

## 2018-11-27 DIAGNOSIS — N186 End stage renal disease: Secondary | ICD-10-CM | POA: Diagnosis not present

## 2018-11-27 DIAGNOSIS — Z452 Encounter for adjustment and management of vascular access device: Secondary | ICD-10-CM | POA: Diagnosis not present

## 2018-11-28 DIAGNOSIS — E1140 Type 2 diabetes mellitus with diabetic neuropathy, unspecified: Secondary | ICD-10-CM | POA: Diagnosis not present

## 2018-11-28 DIAGNOSIS — K219 Gastro-esophageal reflux disease without esophagitis: Secondary | ICD-10-CM | POA: Diagnosis not present

## 2018-11-28 DIAGNOSIS — E118 Type 2 diabetes mellitus with unspecified complications: Secondary | ICD-10-CM | POA: Diagnosis not present

## 2018-11-28 DIAGNOSIS — D509 Iron deficiency anemia, unspecified: Secondary | ICD-10-CM | POA: Diagnosis not present

## 2018-11-28 DIAGNOSIS — G4700 Insomnia, unspecified: Secondary | ICD-10-CM | POA: Diagnosis not present

## 2018-11-28 DIAGNOSIS — I10 Essential (primary) hypertension: Secondary | ICD-10-CM | POA: Diagnosis not present

## 2018-11-29 DIAGNOSIS — D631 Anemia in chronic kidney disease: Secondary | ICD-10-CM | POA: Diagnosis not present

## 2018-11-29 DIAGNOSIS — Z992 Dependence on renal dialysis: Secondary | ICD-10-CM | POA: Diagnosis not present

## 2018-11-29 DIAGNOSIS — Z452 Encounter for adjustment and management of vascular access device: Secondary | ICD-10-CM | POA: Diagnosis not present

## 2018-11-29 DIAGNOSIS — E1129 Type 2 diabetes mellitus with other diabetic kidney complication: Secondary | ICD-10-CM | POA: Diagnosis not present

## 2018-11-29 DIAGNOSIS — D689 Coagulation defect, unspecified: Secondary | ICD-10-CM | POA: Diagnosis not present

## 2018-11-29 DIAGNOSIS — N2581 Secondary hyperparathyroidism of renal origin: Secondary | ICD-10-CM | POA: Diagnosis not present

## 2018-11-29 DIAGNOSIS — D509 Iron deficiency anemia, unspecified: Secondary | ICD-10-CM | POA: Diagnosis not present

## 2018-11-29 DIAGNOSIS — N186 End stage renal disease: Secondary | ICD-10-CM | POA: Diagnosis not present

## 2018-12-02 DIAGNOSIS — D689 Coagulation defect, unspecified: Secondary | ICD-10-CM | POA: Diagnosis not present

## 2018-12-02 DIAGNOSIS — Z992 Dependence on renal dialysis: Secondary | ICD-10-CM | POA: Diagnosis not present

## 2018-12-02 DIAGNOSIS — D509 Iron deficiency anemia, unspecified: Secondary | ICD-10-CM | POA: Diagnosis not present

## 2018-12-02 DIAGNOSIS — N186 End stage renal disease: Secondary | ICD-10-CM | POA: Diagnosis not present

## 2018-12-02 DIAGNOSIS — E1129 Type 2 diabetes mellitus with other diabetic kidney complication: Secondary | ICD-10-CM | POA: Diagnosis not present

## 2018-12-02 DIAGNOSIS — Z452 Encounter for adjustment and management of vascular access device: Secondary | ICD-10-CM | POA: Diagnosis not present

## 2018-12-02 DIAGNOSIS — N2581 Secondary hyperparathyroidism of renal origin: Secondary | ICD-10-CM | POA: Diagnosis not present

## 2018-12-02 DIAGNOSIS — D631 Anemia in chronic kidney disease: Secondary | ICD-10-CM | POA: Diagnosis not present

## 2018-12-04 DIAGNOSIS — E1129 Type 2 diabetes mellitus with other diabetic kidney complication: Secondary | ICD-10-CM | POA: Diagnosis not present

## 2018-12-04 DIAGNOSIS — D631 Anemia in chronic kidney disease: Secondary | ICD-10-CM | POA: Diagnosis not present

## 2018-12-04 DIAGNOSIS — N186 End stage renal disease: Secondary | ICD-10-CM | POA: Diagnosis not present

## 2018-12-04 DIAGNOSIS — Z992 Dependence on renal dialysis: Secondary | ICD-10-CM | POA: Diagnosis not present

## 2018-12-04 DIAGNOSIS — D689 Coagulation defect, unspecified: Secondary | ICD-10-CM | POA: Diagnosis not present

## 2018-12-04 DIAGNOSIS — Z452 Encounter for adjustment and management of vascular access device: Secondary | ICD-10-CM | POA: Diagnosis not present

## 2018-12-04 DIAGNOSIS — N2581 Secondary hyperparathyroidism of renal origin: Secondary | ICD-10-CM | POA: Diagnosis not present

## 2018-12-04 DIAGNOSIS — D509 Iron deficiency anemia, unspecified: Secondary | ICD-10-CM | POA: Diagnosis not present

## 2018-12-05 ENCOUNTER — Ambulatory Visit (INDEPENDENT_AMBULATORY_CARE_PROVIDER_SITE_OTHER): Payer: Medicare Other | Admitting: Cardiology

## 2018-12-05 ENCOUNTER — Other Ambulatory Visit: Payer: Self-pay

## 2018-12-05 VITALS — BP 186/70 | HR 52 | Ht 72.0 in | Wt 192.8 lb

## 2018-12-05 DIAGNOSIS — E782 Mixed hyperlipidemia: Secondary | ICD-10-CM

## 2018-12-05 DIAGNOSIS — Z79899 Other long term (current) drug therapy: Secondary | ICD-10-CM | POA: Diagnosis not present

## 2018-12-05 DIAGNOSIS — I10 Essential (primary) hypertension: Secondary | ICD-10-CM | POA: Diagnosis not present

## 2018-12-05 MED ORDER — CLONIDINE HCL 0.1 MG PO TABS: ORAL_TABLET | ORAL | 1 refills | Status: DC

## 2018-12-05 NOTE — Progress Notes (Signed)
Cardiology Office Note:    Date:  12/05/2018   ID:  James Chan, DOB [birth year], MRN [id number]  PCP:  Cher Nakai, MD  Cardiologist:  Shirlee More, MD    Referring MD: Cher Nakai, MD    ASSESSMENT:    No diagnosis found. PLAN:    In order of problems listed above:  1. 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  Ideally I would prefer for the dialysis team to manage his hypertension for the looks like to be involved by default so decided on his volume overload hypertensive days to resume clonidine.  HTN -see above  ESRD - on HD.  Improved no volume overload  Diastolic HF -improved no volume overload continue renal replacement therapy 2. CAD -stable CAD no anginal discomfort continue medical treatment 3. PAF -continue amiodarone and an aspirin 4. On amiodarone therapy -no evidence of toxicity 5. Iron deficiency anemia -improved hemoglobin greater than 10 6. DLD -continue statin check liver function lipid profile   Next appointment: 3 months   Medication Adjustments/Labs and Tests Ordered: Current medicines are reviewed at length with the patient today.  Concerns regarding medicines are outlined above.  No orders of the defined types were placed in this encounter.  No orders of the defined types were placed in this encounter.   No chief complaint on file.   History of Present Illness:    James Chan is a 68 y.o. male with a hx of CAD s/p NSTEMI 04/19/16 with PCI of ISR of RCA with 2 DEES and multiple PCI of RCA for restenosis, brief PAF on amiodarone, diastolic CHF, DLD, HTN, PVD, ESRD on HD and anemia last seen 06/20/18. Echo 11/15/2017 with EF 50-55%, mild LVH, mild AR, mod to severe MR, diastolic dysfunction.   Since last office visit he had a fistula (R BVT) placed 07/03/08 with second stage surgery 10/10/18.     Compliance with diet, lifestyle and medications: Yes  Overall is doing better with hemodialysis he is no volume overload signs or symptoms of heart failure.  His problem list and and non-hemodialysis days he has high blood pressure and he is symptomatic hypotension the days of dialysis he is altering the dose of hydralazine and asked him to resume clonidine the days after dialysis 0.1 mg in the morning and take an extra point 1 in the evening for systolics greater than 885.  No edema orthopnea chest pain palpitation or syncope just complains of profound weakness and malaise and exercise intolerance after hemodialysis.  His EKG shows sinus rhythm and is unchanged old inferior MI LVH repolarization Past Medical History:  Diagnosis Date  . Anemia   . Anxiety state, unspecified 09/15/2008   Qualifier: Diagnosis of  By: Bullins CMA, Ami  , patient denies  . Arterial insufficiency of lower extremity ([place]) 05/10/2016  . Arthritis    elbows  . BACK PAIN, LUMBAR, CHRONIC 09/15/2008   Qualifier: Diagnosis of  By: Bullins CMA, Ami    . CHF (congestive heart failure) ([place])   . Claudication ([place]) 10/30/2014  . Comprehensive diabetic foot examination, type 2 DM, encounter for [facility]) 09/15/2008   Qualifier: Diagnosis of  By: Bullins CMA, Ami    . DEPRESSIVE DISORDER 09/15/2008   Qualifier: Diagnosis of  By: Bullins CMA, Ami    . Diabetes mellitus without complication ([place])   . Diabetic polyneuropathy associated with diabetes  mellitus due to underlying condition ([place]) 06/28/2015  . Dyslipidemia 11/24/2015  . Dyspnea    with exertion  . End stage renal disease on dialysis ([place])    M/W/F [place] [facility]  . Esophageal reflux 09/15/2008   Qualifier: Diagnosis of  By: Bullins CMA, Ami    . Glaucoma   . Gout   . Hammer toes of both feet 06/28/2015  . History of blood transfusion   . History of carotid artery stenosis 10/30/2014  . 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 Hyperlipidemia   . Hypertension    . Hypertensive heart failure ([place]) 11/28/2016  . HYPERTRIGLYCERIDEMIA 09/15/2008   Qualifier: Diagnosis of  By: Bullins CMA, Ami    . Myocardial infarction ([place]) 1997  . NSTEMI (non-ST elevated myocardial infarction) ([place]) 09/15/2008   Qualifier: Diagnosis of  By: Bullins CMA, Ami    . Onychomycosis due to dermatophyte 06/28/2015  . PAD (peripheral artery disease) ([place]) 10/30/2014  . Sleep apnea    no CPAP  . Stroke [facility])    no residual    Past Surgical History:  Procedure Laterality Date  . angioplasty of iliofemoral and iliac artery with stent placement    . AV FISTULA PLACEMENT Right 07/04/2018   Procedure: CREATION BASILIC VEIN ARTERIOVENOUS FISTULA RIGHT ARM;  Surgeon: [name], MD;  Location: [facility];  Service: Vascular;  Laterality: Right;  . BASCILIC VEIN TRANSPOSITION Right 10/10/2018   Procedure: BASILIC VEIN TRANSPOSITION SECOND STAGE Right upper arm;  Surgeon: [name], MD;  Location: [facility];  Service: Vascular;  Laterality: Right;  . CARDIAC CATHETERIZATION    . CAROTID STENT Bilateral   . CATARACT EXTRACTION W/ INTRAOCULAR LENS  IMPLANT, BILATERAL    . COLONOSCOPY W/ POLYPECTOMY    . CORONARY ANGIOPLASTY     stents x 5  . INSERTION OF DIALYSIS CATHETER    . KNEE SURGERY     right/ left worked on ligaments and tendons  . OTHER SURGICAL HISTORY     reports 32 stents to include being in eye    Current Medications: No outpatient medications have been marked as taking for the 12/05/18 encounter (Appointment) with Richardo Priest, MD.     Allergies:   Patient has no known allergies.   Social History   Socioeconomic History  . Marital status: Married    Spouse name: Not on file  . Number of children: Not on file  . Years of education: Not on file  . Highest education level: Not on file  Occupational History  . Not on file  Social Needs  . Financial resource strain: Not on file  . Food insecurity    Worry: Not on file    Inability: Not on file  .  Transportation needs    Medical: Not on file    Non-medical: Not on file  Tobacco Use  . Smoking status: Former Smoker    Years: 36.00    Quit date: 05/10/1995    Years since quitting: 23.5  . Smokeless tobacco: Never Used  Substance and Sexual Activity  . Alcohol use: No  . Drug use: No  . Sexual activity: Not on file  Lifestyle  . Physical activity    Days per week: Not on file    Minutes per session: Not on file  . Stress: Not on file  Relationships  . Social Herbalist on phone: Not on file    Gets together: Not on file  Attends religious service: Not on file    Active member of club or organization: Not on file    Attends meetings of clubs or organizations: Not on file    Relationship status: Not on file  Other Topics Concern  . Not on file  Social History Narrative  . Not on file     Family History: The patient's family history includes Cancer in his father and mother; Heart disease in his father and mother. ROS:   Please see the history of present illness.    All other systems reviewed and are negative.  EKGs/Labs/Other Studies Reviewed:    The following studies were reviewed today:  EKG:  EKG ordered today noted history  Recent Labs:  10/10/2018 hemoglobin 10.2, his last lipid profile 06/13/2018 cholesterol 111 LDL 63 05/09/2018: ALT 16; BUN 106; Creatinine, Ser 3.98; Platelets 522 10/10/2018: Hemoglobin 10.2; Potassium 3.7; Sodium 137  Recent Lipid Panel    Component Value Date/Time   CHOL 147 07/02/2017 0939   TRIG 143 07/02/2017 0939   HDL 32 (L) 07/02/2017 0939   CHOLHDL 4.6 07/02/2017 0939   LDLCALC 86 07/02/2017 0939    Physical Exam:    VS:  There were no vitals taken for this visit.    Wt Readings from Last 3 Encounters:  10/15/18 194 lb 6.4 oz (88.2 kg)  10/07/18 186 lb 14.4 oz (84.8 kg)  07/04/18 182 lb (82.6 kg)     GEN:  Well nourished, well developed in no acute distress HEENT: Normal NECK: No JVD; No carotid bruits  LYMPHATICS: No lymphadenopathy CARDIAC: RRR, no murmurs, rubs, gallops RESPIRATORY:  Clear to auscultation without rales, wheezing or rhonchi  ABDOMEN: Soft, non-tender, non-distended MUSCULOSKELETAL:  No edema; No deformity  SKIN: Warm and dry NEUROLOGIC:  Alert and oriented x 3 PSYCHIATRIC:  Normal affect    Signed, Shirlee More, MD  12/05/2018 7:39 AM    [place]

## 2018-12-05 NOTE — Patient Instructions (Addendum)
Medication Instructions:  Your physician has recommended you make the following change in your medication:   START Clonidine  On NON-HD days take Clonidine 0.1mg  (one tablet) in the morning  IF systolic blood pressure (the top number) is above 180 take Clonidine 0.1mg  (one tablet) in the evening on NON-HD days only.   If you need a refill on your cardiac medications before your next appointment, please call your pharmacy.   Lab work: Your physician recommends that you return for lab work today: CMP, TSh, lipid panel.   If you have labs (blood work) drawn today and your tests are completely normal, you will receive your results only by: Marland Kitchen MyChart Message (if you have MyChart) OR . A paper copy in the mail If you have any lab test that is abnormal or we need to change your treatment, we will call you to review the results.  Testing/Procedures: You had an EKG today.  Follow-Up: At [facility], you and your health needs are our priority.  As part of our continuing mission to provide you with exceptional heart care, we have created designated Provider Care Teams.  These Care Teams include your primary Cardiologist (physician) and Advanced Practice Providers (APPs -  Physician Assistants and Nurse Practitioners) who all work together to provide you with the care you need, when you need it. You will need a follow up appointment in 3 months.    Any Other Special Instructions Will Be Listed Below (If Applicable).    Clonidine tablets What is this medicine? CLONIDINE (KLOE ni deen) is used to treat high blood pressure. This medicine may be used for other purposes; ask your health care provider or pharmacist if you have questions. COMMON BRAND NAME(S): Catapres What should I tell my health care provider before I take this medicine? They need to know if you have any of these conditions:  kidney disease  an unusual or allergic reaction to clonidine, other medicines, foods, dyes, or  preservatives  pregnant or trying to get pregnant  breast-feeding How should I use this medicine? Take this medicine by mouth with a glass of water. Follow the directions on the prescription label. Take your doses at regular intervals. Do not take your medicine more often than directed. Do not suddenly stop taking this medicine. You must gradually reduce the dose or you may get a dangerous increase in blood pressure. Ask your doctor or health care professional for advice. Talk to your pediatrician regarding the use of this medicine in children. Special care may be needed. Overdosage: If you think you have taken too much of this medicine contact a poison control center or emergency room at once. NOTE: This medicine is only for you. Do not share this medicine with others. What if I miss a dose? If you miss a dose, take it as soon as you can. If it is almost time for your next dose, take only that dose. Do not take double or extra doses. What may interact with this medicine? Do not take this medicine with any of the following medications:  MAOIs like Carbex, Eldepryl, Marplan, Nardil, and Parnate This medicine may also interact with the following medications:  barbiturate medicines for inducing sleep or treating seizures like phenobarbital  certain medicines for blood pressure, heart disease, irregular heart beat  certain medicines for depression, anxiety, or psychotic disturbances  prescription pain medicines This list may not describe all possible interactions. Give your health care provider a list of all the medicines, herbs, non-prescription  drugs, or dietary supplements you use. Also tell them if you smoke, drink alcohol, or use illegal drugs. Some items may interact with your medicine. What should I watch for while using this medicine? Visit your doctor or health care professional for regular checks on your progress. Check your heart rate and blood pressure regularly while you are  taking this medicine. Ask your doctor or health care professional what your heart rate should be and when you should contact him or her. You may get drowsy or dizzy. Do not drive, use machinery, or do anything that needs mental alertness until you know how this medicine affects you. To avoid dizzy or fainting spells, do not stand or sit up quickly, especially if you are an older person. Alcohol can make you more drowsy and dizzy. Avoid alcoholic drinks. Your mouth may get dry. Chewing sugarless gum or sucking hard candy, and drinking plenty of water will help. Do not treat yourself for coughs, colds, or pain while you are taking this medicine without asking your doctor or health care professional for advice. Some ingredients may increase your blood pressure. If you are going to have surgery tell your doctor or health care professional that you are taking this medicine. What side effects may I notice from receiving this medicine? Side effects that you should report to your doctor or health care professional as soon as possible:  allergic reactions like skin rash, itching or hives, swelling of the face, lips, or tongue  anxiety, nervousness  chest pain  depression  fast, irregular heartbeat  swelling of feet or legs  unusually weak or tired Side effects that usually do not require medical attention (report to your doctor or health care professional if they continue or are bothersome):  change in sex drive or performance  constipation  headache This list may not describe all possible side effects. Call your doctor for medical advice about side effects. You may report side effects to FDA at 1-800-FDA-1088. Where should I keep my medicine? Keep out of the reach of children. Store at room temperature between 15 and 30 degrees C (59 and 86 degrees F). Protect from light. Keep container tightly closed. Throw away any unused medicine after the expiration date. NOTE: This sheet is a summary. It  may not cover all possible information. If you have questions about this medicine, talk to your doctor, pharmacist, or health care provider.  2020 Elsevier/Gold Standard (2010-10-05 13:01:28)

## 2018-12-06 ENCOUNTER — Telehealth: Payer: Self-pay

## 2018-12-06 DIAGNOSIS — D631 Anemia in chronic kidney disease: Secondary | ICD-10-CM | POA: Diagnosis not present

## 2018-12-06 DIAGNOSIS — N2581 Secondary hyperparathyroidism of renal origin: Secondary | ICD-10-CM | POA: Diagnosis not present

## 2018-12-06 DIAGNOSIS — N186 End stage renal disease: Secondary | ICD-10-CM | POA: Diagnosis not present

## 2018-12-06 DIAGNOSIS — Z452 Encounter for adjustment and management of vascular access device: Secondary | ICD-10-CM | POA: Diagnosis not present

## 2018-12-06 DIAGNOSIS — Z992 Dependence on renal dialysis: Secondary | ICD-10-CM | POA: Diagnosis not present

## 2018-12-06 DIAGNOSIS — E1129 Type 2 diabetes mellitus with other diabetic kidney complication: Secondary | ICD-10-CM | POA: Diagnosis not present

## 2018-12-06 DIAGNOSIS — D689 Coagulation defect, unspecified: Secondary | ICD-10-CM | POA: Diagnosis not present

## 2018-12-06 DIAGNOSIS — D509 Iron deficiency anemia, unspecified: Secondary | ICD-10-CM | POA: Diagnosis not present

## 2018-12-06 LAB — COMPREHENSIVE METABOLIC PANEL
ALT: 15 IU/L (ref 0–44)
AST: 20 IU/L (ref 0–40)
Albumin/Globulin Ratio: 1.7 (ref 1.2–2.2)
Albumin: 4.3 g/dL (ref 3.8–4.8)
Alkaline Phosphatase: 117 IU/L (ref 39–117)
BUN/Creatinine Ratio: 9 — ABNORMAL LOW (ref 10–24)
BUN: 32 mg/dL — ABNORMAL HIGH (ref 8–27)
Bilirubin Total: 0.5 mg/dL (ref 0.0–1.2)
CO2: 27 mmol/L (ref 20–29)
Calcium: 9.5 mg/dL (ref 8.6–10.2)
Chloride: 95 mmol/L — ABNORMAL LOW (ref 96–106)
Creatinine, Ser: 3.75 mg/dL — ABNORMAL HIGH (ref 0.76–1.27)
GFR calc Af Amer: 18 mL/min/{1.73_m2} — ABNORMAL LOW (ref >59)
GFR calc non Af Amer: 16 mL/min/{1.73_m2} — ABNORMAL LOW (ref >59)
Globulin, Total: 2.6 g/dL (ref 1.5–4.5)
Glucose: 73 mg/dL (ref 65–99)
Potassium: 4.6 mmol/L (ref 3.5–5.2)
Sodium: 138 mmol/L (ref 134–144)
Total Protein: 6.9 g/dL (ref 6.0–8.5)

## 2018-12-06 LAB — LIPID PANEL
Chol/HDL Ratio: 3.9 ratio (ref 0.0–5.0)
Cholesterol, Total: 155 mg/dL (ref 100–199)
HDL: 40 mg/dL (ref >39)
LDL Calculated: 97 mg/dL (ref 0–99)
Triglycerides: 90 mg/dL (ref 0–149)
VLDL Cholesterol Cal: 18 mg/dL (ref 5–40)

## 2018-12-06 LAB — TSH: TSH: 3.74 u[IU]/mL (ref 0.450–4.500)

## 2018-12-06 NOTE — Telephone Encounter (Signed)
Patient notified of stable lab results per Dr Bettina Gavia, with no changes in his medications at this time.  Patient agreed to plan and verbalized understanding.  No further questions.

## 2018-12-09 DIAGNOSIS — D631 Anemia in chronic kidney disease: Secondary | ICD-10-CM | POA: Diagnosis not present

## 2018-12-09 DIAGNOSIS — N2581 Secondary hyperparathyroidism of renal origin: Secondary | ICD-10-CM | POA: Diagnosis not present

## 2018-12-09 DIAGNOSIS — Z452 Encounter for adjustment and management of vascular access device: Secondary | ICD-10-CM | POA: Diagnosis not present

## 2018-12-09 DIAGNOSIS — D509 Iron deficiency anemia, unspecified: Secondary | ICD-10-CM | POA: Diagnosis not present

## 2018-12-09 DIAGNOSIS — E1129 Type 2 diabetes mellitus with other diabetic kidney complication: Secondary | ICD-10-CM | POA: Diagnosis not present

## 2018-12-09 DIAGNOSIS — Z992 Dependence on renal dialysis: Secondary | ICD-10-CM | POA: Diagnosis not present

## 2018-12-09 DIAGNOSIS — N186 End stage renal disease: Secondary | ICD-10-CM | POA: Diagnosis not present

## 2018-12-09 DIAGNOSIS — D689 Coagulation defect, unspecified: Secondary | ICD-10-CM | POA: Diagnosis not present

## 2018-12-11 DIAGNOSIS — E1129 Type 2 diabetes mellitus with other diabetic kidney complication: Secondary | ICD-10-CM | POA: Diagnosis not present

## 2018-12-11 DIAGNOSIS — Z452 Encounter for adjustment and management of vascular access device: Secondary | ICD-10-CM | POA: Diagnosis not present

## 2018-12-11 DIAGNOSIS — D631 Anemia in chronic kidney disease: Secondary | ICD-10-CM | POA: Diagnosis not present

## 2018-12-11 DIAGNOSIS — N2581 Secondary hyperparathyroidism of renal origin: Secondary | ICD-10-CM | POA: Diagnosis not present

## 2018-12-11 DIAGNOSIS — N186 End stage renal disease: Secondary | ICD-10-CM | POA: Diagnosis not present

## 2018-12-11 DIAGNOSIS — Z992 Dependence on renal dialysis: Secondary | ICD-10-CM | POA: Diagnosis not present

## 2018-12-11 DIAGNOSIS — D689 Coagulation defect, unspecified: Secondary | ICD-10-CM | POA: Diagnosis not present

## 2018-12-11 DIAGNOSIS — D509 Iron deficiency anemia, unspecified: Secondary | ICD-10-CM | POA: Diagnosis not present

## 2018-12-12 ENCOUNTER — Ambulatory Visit: Payer: Medicare Other | Admitting: Cardiology

## 2018-12-12 DIAGNOSIS — G4700 Insomnia, unspecified: Secondary | ICD-10-CM | POA: Diagnosis not present

## 2018-12-12 DIAGNOSIS — E1140 Type 2 diabetes mellitus with diabetic neuropathy, unspecified: Secondary | ICD-10-CM | POA: Diagnosis not present

## 2018-12-12 DIAGNOSIS — E113299 Type 2 diabetes mellitus with mild nonproliferative diabetic retinopathy without macular edema, unspecified eye: Secondary | ICD-10-CM | POA: Diagnosis not present

## 2018-12-12 DIAGNOSIS — K219 Gastro-esophageal reflux disease without esophagitis: Secondary | ICD-10-CM | POA: Diagnosis not present

## 2018-12-12 DIAGNOSIS — D509 Iron deficiency anemia, unspecified: Secondary | ICD-10-CM | POA: Diagnosis not present

## 2018-12-13 DIAGNOSIS — D509 Iron deficiency anemia, unspecified: Secondary | ICD-10-CM | POA: Diagnosis not present

## 2018-12-13 DIAGNOSIS — N186 End stage renal disease: Secondary | ICD-10-CM | POA: Diagnosis not present

## 2018-12-13 DIAGNOSIS — N2581 Secondary hyperparathyroidism of renal origin: Secondary | ICD-10-CM | POA: Diagnosis not present

## 2018-12-13 DIAGNOSIS — D631 Anemia in chronic kidney disease: Secondary | ICD-10-CM | POA: Diagnosis not present

## 2018-12-13 DIAGNOSIS — D689 Coagulation defect, unspecified: Secondary | ICD-10-CM | POA: Diagnosis not present

## 2018-12-13 DIAGNOSIS — E1129 Type 2 diabetes mellitus with other diabetic kidney complication: Secondary | ICD-10-CM | POA: Diagnosis not present

## 2018-12-13 DIAGNOSIS — Z992 Dependence on renal dialysis: Secondary | ICD-10-CM | POA: Diagnosis not present

## 2018-12-13 DIAGNOSIS — Z452 Encounter for adjustment and management of vascular access device: Secondary | ICD-10-CM | POA: Diagnosis not present

## 2018-12-16 DIAGNOSIS — E1129 Type 2 diabetes mellitus with other diabetic kidney complication: Secondary | ICD-10-CM | POA: Diagnosis not present

## 2018-12-16 DIAGNOSIS — D509 Iron deficiency anemia, unspecified: Secondary | ICD-10-CM | POA: Diagnosis not present

## 2018-12-16 DIAGNOSIS — N186 End stage renal disease: Secondary | ICD-10-CM | POA: Diagnosis not present

## 2018-12-16 DIAGNOSIS — D689 Coagulation defect, unspecified: Secondary | ICD-10-CM | POA: Diagnosis not present

## 2018-12-16 DIAGNOSIS — Z452 Encounter for adjustment and management of vascular access device: Secondary | ICD-10-CM | POA: Diagnosis not present

## 2018-12-16 DIAGNOSIS — N2581 Secondary hyperparathyroidism of renal origin: Secondary | ICD-10-CM | POA: Diagnosis not present

## 2018-12-16 DIAGNOSIS — D631 Anemia in chronic kidney disease: Secondary | ICD-10-CM | POA: Diagnosis not present

## 2018-12-16 DIAGNOSIS — Z992 Dependence on renal dialysis: Secondary | ICD-10-CM | POA: Diagnosis not present

## 2018-12-18 DIAGNOSIS — N186 End stage renal disease: Secondary | ICD-10-CM | POA: Diagnosis not present

## 2018-12-18 DIAGNOSIS — Z992 Dependence on renal dialysis: Secondary | ICD-10-CM | POA: Diagnosis not present

## 2018-12-18 DIAGNOSIS — D689 Coagulation defect, unspecified: Secondary | ICD-10-CM | POA: Diagnosis not present

## 2018-12-18 DIAGNOSIS — Z452 Encounter for adjustment and management of vascular access device: Secondary | ICD-10-CM | POA: Diagnosis not present

## 2018-12-18 DIAGNOSIS — N2581 Secondary hyperparathyroidism of renal origin: Secondary | ICD-10-CM | POA: Diagnosis not present

## 2018-12-18 DIAGNOSIS — D631 Anemia in chronic kidney disease: Secondary | ICD-10-CM | POA: Diagnosis not present

## 2018-12-18 DIAGNOSIS — E1129 Type 2 diabetes mellitus with other diabetic kidney complication: Secondary | ICD-10-CM | POA: Diagnosis not present

## 2018-12-18 DIAGNOSIS — D509 Iron deficiency anemia, unspecified: Secondary | ICD-10-CM | POA: Diagnosis not present

## 2018-12-20 DIAGNOSIS — N186 End stage renal disease: Secondary | ICD-10-CM | POA: Diagnosis not present

## 2018-12-20 DIAGNOSIS — Z992 Dependence on renal dialysis: Secondary | ICD-10-CM | POA: Diagnosis not present

## 2018-12-20 DIAGNOSIS — N2581 Secondary hyperparathyroidism of renal origin: Secondary | ICD-10-CM | POA: Diagnosis not present

## 2018-12-20 DIAGNOSIS — D689 Coagulation defect, unspecified: Secondary | ICD-10-CM | POA: Diagnosis not present

## 2018-12-20 DIAGNOSIS — D631 Anemia in chronic kidney disease: Secondary | ICD-10-CM | POA: Diagnosis not present

## 2018-12-20 DIAGNOSIS — Z452 Encounter for adjustment and management of vascular access device: Secondary | ICD-10-CM | POA: Diagnosis not present

## 2018-12-20 DIAGNOSIS — E1129 Type 2 diabetes mellitus with other diabetic kidney complication: Secondary | ICD-10-CM | POA: Diagnosis not present

## 2018-12-20 DIAGNOSIS — D509 Iron deficiency anemia, unspecified: Secondary | ICD-10-CM | POA: Diagnosis not present

## 2018-12-23 DIAGNOSIS — E1122 Type 2 diabetes mellitus with diabetic chronic kidney disease: Secondary | ICD-10-CM | POA: Diagnosis not present

## 2018-12-23 DIAGNOSIS — N186 End stage renal disease: Secondary | ICD-10-CM | POA: Diagnosis not present

## 2018-12-23 DIAGNOSIS — N2581 Secondary hyperparathyroidism of renal origin: Secondary | ICD-10-CM | POA: Diagnosis not present

## 2018-12-23 DIAGNOSIS — E1129 Type 2 diabetes mellitus with other diabetic kidney complication: Secondary | ICD-10-CM | POA: Diagnosis not present

## 2018-12-23 DIAGNOSIS — Z452 Encounter for adjustment and management of vascular access device: Secondary | ICD-10-CM | POA: Diagnosis not present

## 2018-12-23 DIAGNOSIS — Z992 Dependence on renal dialysis: Secondary | ICD-10-CM | POA: Diagnosis not present

## 2018-12-23 DIAGNOSIS — D509 Iron deficiency anemia, unspecified: Secondary | ICD-10-CM | POA: Diagnosis not present

## 2018-12-23 DIAGNOSIS — D689 Coagulation defect, unspecified: Secondary | ICD-10-CM | POA: Diagnosis not present

## 2018-12-23 DIAGNOSIS — D631 Anemia in chronic kidney disease: Secondary | ICD-10-CM | POA: Diagnosis not present

## 2018-12-25 ENCOUNTER — Other Ambulatory Visit: Payer: Self-pay | Admitting: Cardiology

## 2018-12-25 DIAGNOSIS — D689 Coagulation defect, unspecified: Secondary | ICD-10-CM | POA: Diagnosis not present

## 2018-12-25 DIAGNOSIS — Z452 Encounter for adjustment and management of vascular access device: Secondary | ICD-10-CM | POA: Diagnosis not present

## 2018-12-25 DIAGNOSIS — D509 Iron deficiency anemia, unspecified: Secondary | ICD-10-CM | POA: Diagnosis not present

## 2018-12-25 DIAGNOSIS — I38 Endocarditis, valve unspecified: Secondary | ICD-10-CM

## 2018-12-25 DIAGNOSIS — N2581 Secondary hyperparathyroidism of renal origin: Secondary | ICD-10-CM | POA: Diagnosis not present

## 2018-12-25 DIAGNOSIS — N186 End stage renal disease: Secondary | ICD-10-CM | POA: Diagnosis not present

## 2018-12-25 DIAGNOSIS — I10 Essential (primary) hypertension: Secondary | ICD-10-CM

## 2018-12-25 DIAGNOSIS — I5042 Chronic combined systolic (congestive) and diastolic (congestive) heart failure: Secondary | ICD-10-CM

## 2018-12-25 DIAGNOSIS — E1129 Type 2 diabetes mellitus with other diabetic kidney complication: Secondary | ICD-10-CM | POA: Diagnosis not present

## 2018-12-25 DIAGNOSIS — Z992 Dependence on renal dialysis: Secondary | ICD-10-CM | POA: Diagnosis not present

## 2018-12-25 DIAGNOSIS — D631 Anemia in chronic kidney disease: Secondary | ICD-10-CM | POA: Diagnosis not present

## 2018-12-27 DIAGNOSIS — Z992 Dependence on renal dialysis: Secondary | ICD-10-CM | POA: Diagnosis not present

## 2018-12-27 DIAGNOSIS — E1129 Type 2 diabetes mellitus with other diabetic kidney complication: Secondary | ICD-10-CM | POA: Diagnosis not present

## 2018-12-27 DIAGNOSIS — D631 Anemia in chronic kidney disease: Secondary | ICD-10-CM | POA: Diagnosis not present

## 2018-12-27 DIAGNOSIS — N186 End stage renal disease: Secondary | ICD-10-CM | POA: Diagnosis not present

## 2018-12-27 DIAGNOSIS — D689 Coagulation defect, unspecified: Secondary | ICD-10-CM | POA: Diagnosis not present

## 2018-12-27 DIAGNOSIS — D509 Iron deficiency anemia, unspecified: Secondary | ICD-10-CM | POA: Diagnosis not present

## 2018-12-27 DIAGNOSIS — Z452 Encounter for adjustment and management of vascular access device: Secondary | ICD-10-CM | POA: Diagnosis not present

## 2018-12-27 DIAGNOSIS — N2581 Secondary hyperparathyroidism of renal origin: Secondary | ICD-10-CM | POA: Diagnosis not present

## 2018-12-30 DIAGNOSIS — D509 Iron deficiency anemia, unspecified: Secondary | ICD-10-CM | POA: Diagnosis not present

## 2018-12-30 DIAGNOSIS — N186 End stage renal disease: Secondary | ICD-10-CM | POA: Diagnosis not present

## 2018-12-30 DIAGNOSIS — D689 Coagulation defect, unspecified: Secondary | ICD-10-CM | POA: Diagnosis not present

## 2018-12-30 DIAGNOSIS — E1129 Type 2 diabetes mellitus with other diabetic kidney complication: Secondary | ICD-10-CM | POA: Diagnosis not present

## 2018-12-30 DIAGNOSIS — Z452 Encounter for adjustment and management of vascular access device: Secondary | ICD-10-CM | POA: Diagnosis not present

## 2018-12-30 DIAGNOSIS — D631 Anemia in chronic kidney disease: Secondary | ICD-10-CM | POA: Diagnosis not present

## 2018-12-30 DIAGNOSIS — Z992 Dependence on renal dialysis: Secondary | ICD-10-CM | POA: Diagnosis not present

## 2018-12-30 DIAGNOSIS — N2581 Secondary hyperparathyroidism of renal origin: Secondary | ICD-10-CM | POA: Diagnosis not present

## 2019-01-01 DIAGNOSIS — Z992 Dependence on renal dialysis: Secondary | ICD-10-CM | POA: Diagnosis not present

## 2019-01-01 DIAGNOSIS — D509 Iron deficiency anemia, unspecified: Secondary | ICD-10-CM | POA: Diagnosis not present

## 2019-01-01 DIAGNOSIS — N2581 Secondary hyperparathyroidism of renal origin: Secondary | ICD-10-CM | POA: Diagnosis not present

## 2019-01-01 DIAGNOSIS — D689 Coagulation defect, unspecified: Secondary | ICD-10-CM | POA: Diagnosis not present

## 2019-01-01 DIAGNOSIS — Z452 Encounter for adjustment and management of vascular access device: Secondary | ICD-10-CM | POA: Diagnosis not present

## 2019-01-01 DIAGNOSIS — N186 End stage renal disease: Secondary | ICD-10-CM | POA: Diagnosis not present

## 2019-01-01 DIAGNOSIS — E1129 Type 2 diabetes mellitus with other diabetic kidney complication: Secondary | ICD-10-CM | POA: Diagnosis not present

## 2019-01-01 DIAGNOSIS — D631 Anemia in chronic kidney disease: Secondary | ICD-10-CM | POA: Diagnosis not present

## 2019-01-03 DIAGNOSIS — Z992 Dependence on renal dialysis: Secondary | ICD-10-CM | POA: Diagnosis not present

## 2019-01-03 DIAGNOSIS — D689 Coagulation defect, unspecified: Secondary | ICD-10-CM | POA: Diagnosis not present

## 2019-01-03 DIAGNOSIS — E1129 Type 2 diabetes mellitus with other diabetic kidney complication: Secondary | ICD-10-CM | POA: Diagnosis not present

## 2019-01-03 DIAGNOSIS — D509 Iron deficiency anemia, unspecified: Secondary | ICD-10-CM | POA: Diagnosis not present

## 2019-01-03 DIAGNOSIS — D631 Anemia in chronic kidney disease: Secondary | ICD-10-CM | POA: Diagnosis not present

## 2019-01-03 DIAGNOSIS — N186 End stage renal disease: Secondary | ICD-10-CM | POA: Diagnosis not present

## 2019-01-03 DIAGNOSIS — Z452 Encounter for adjustment and management of vascular access device: Secondary | ICD-10-CM | POA: Diagnosis not present

## 2019-01-03 DIAGNOSIS — N2581 Secondary hyperparathyroidism of renal origin: Secondary | ICD-10-CM | POA: Diagnosis not present

## 2019-01-06 DIAGNOSIS — E1129 Type 2 diabetes mellitus with other diabetic kidney complication: Secondary | ICD-10-CM | POA: Diagnosis not present

## 2019-01-06 DIAGNOSIS — Z992 Dependence on renal dialysis: Secondary | ICD-10-CM | POA: Diagnosis not present

## 2019-01-06 DIAGNOSIS — D509 Iron deficiency anemia, unspecified: Secondary | ICD-10-CM | POA: Diagnosis not present

## 2019-01-06 DIAGNOSIS — D689 Coagulation defect, unspecified: Secondary | ICD-10-CM | POA: Diagnosis not present

## 2019-01-06 DIAGNOSIS — N186 End stage renal disease: Secondary | ICD-10-CM | POA: Diagnosis not present

## 2019-01-06 DIAGNOSIS — D631 Anemia in chronic kidney disease: Secondary | ICD-10-CM | POA: Diagnosis not present

## 2019-01-06 DIAGNOSIS — N2581 Secondary hyperparathyroidism of renal origin: Secondary | ICD-10-CM | POA: Diagnosis not present

## 2019-01-06 DIAGNOSIS — Z452 Encounter for adjustment and management of vascular access device: Secondary | ICD-10-CM | POA: Diagnosis not present

## 2019-01-07 DIAGNOSIS — N186 End stage renal disease: Secondary | ICD-10-CM | POA: Diagnosis not present

## 2019-01-07 DIAGNOSIS — I871 Compression of vein: Secondary | ICD-10-CM | POA: Diagnosis not present

## 2019-01-07 DIAGNOSIS — Z992 Dependence on renal dialysis: Secondary | ICD-10-CM | POA: Diagnosis not present

## 2019-01-07 DIAGNOSIS — T82858A Stenosis of vascular prosthetic devices, implants and grafts, initial encounter: Secondary | ICD-10-CM | POA: Diagnosis not present

## 2019-01-08 DIAGNOSIS — D689 Coagulation defect, unspecified: Secondary | ICD-10-CM | POA: Diagnosis not present

## 2019-01-08 DIAGNOSIS — D631 Anemia in chronic kidney disease: Secondary | ICD-10-CM | POA: Diagnosis not present

## 2019-01-08 DIAGNOSIS — Z452 Encounter for adjustment and management of vascular access device: Secondary | ICD-10-CM | POA: Diagnosis not present

## 2019-01-08 DIAGNOSIS — N186 End stage renal disease: Secondary | ICD-10-CM | POA: Diagnosis not present

## 2019-01-08 DIAGNOSIS — N2581 Secondary hyperparathyroidism of renal origin: Secondary | ICD-10-CM | POA: Diagnosis not present

## 2019-01-08 DIAGNOSIS — Z992 Dependence on renal dialysis: Secondary | ICD-10-CM | POA: Diagnosis not present

## 2019-01-08 DIAGNOSIS — E1129 Type 2 diabetes mellitus with other diabetic kidney complication: Secondary | ICD-10-CM | POA: Diagnosis not present

## 2019-01-08 DIAGNOSIS — D509 Iron deficiency anemia, unspecified: Secondary | ICD-10-CM | POA: Diagnosis not present

## 2019-01-09 DIAGNOSIS — E1140 Type 2 diabetes mellitus with diabetic neuropathy, unspecified: Secondary | ICD-10-CM | POA: Diagnosis not present

## 2019-01-09 DIAGNOSIS — E113299 Type 2 diabetes mellitus with mild nonproliferative diabetic retinopathy without macular edema, unspecified eye: Secondary | ICD-10-CM | POA: Diagnosis not present

## 2019-01-09 DIAGNOSIS — G4700 Insomnia, unspecified: Secondary | ICD-10-CM | POA: Diagnosis not present

## 2019-01-09 DIAGNOSIS — K219 Gastro-esophageal reflux disease without esophagitis: Secondary | ICD-10-CM | POA: Diagnosis not present

## 2019-01-09 DIAGNOSIS — D509 Iron deficiency anemia, unspecified: Secondary | ICD-10-CM | POA: Diagnosis not present

## 2019-01-10 DIAGNOSIS — N2581 Secondary hyperparathyroidism of renal origin: Secondary | ICD-10-CM | POA: Diagnosis not present

## 2019-01-10 DIAGNOSIS — D631 Anemia in chronic kidney disease: Secondary | ICD-10-CM | POA: Diagnosis not present

## 2019-01-10 DIAGNOSIS — D689 Coagulation defect, unspecified: Secondary | ICD-10-CM | POA: Diagnosis not present

## 2019-01-10 DIAGNOSIS — D509 Iron deficiency anemia, unspecified: Secondary | ICD-10-CM | POA: Diagnosis not present

## 2019-01-10 DIAGNOSIS — E1129 Type 2 diabetes mellitus with other diabetic kidney complication: Secondary | ICD-10-CM | POA: Diagnosis not present

## 2019-01-10 DIAGNOSIS — Z992 Dependence on renal dialysis: Secondary | ICD-10-CM | POA: Diagnosis not present

## 2019-01-10 DIAGNOSIS — Z452 Encounter for adjustment and management of vascular access device: Secondary | ICD-10-CM | POA: Diagnosis not present

## 2019-01-10 DIAGNOSIS — N186 End stage renal disease: Secondary | ICD-10-CM | POA: Diagnosis not present

## 2019-01-13 DIAGNOSIS — D631 Anemia in chronic kidney disease: Secondary | ICD-10-CM | POA: Diagnosis not present

## 2019-01-13 DIAGNOSIS — N186 End stage renal disease: Secondary | ICD-10-CM | POA: Diagnosis not present

## 2019-01-13 DIAGNOSIS — D689 Coagulation defect, unspecified: Secondary | ICD-10-CM | POA: Diagnosis not present

## 2019-01-13 DIAGNOSIS — D509 Iron deficiency anemia, unspecified: Secondary | ICD-10-CM | POA: Diagnosis not present

## 2019-01-13 DIAGNOSIS — E1129 Type 2 diabetes mellitus with other diabetic kidney complication: Secondary | ICD-10-CM | POA: Diagnosis not present

## 2019-01-13 DIAGNOSIS — Z992 Dependence on renal dialysis: Secondary | ICD-10-CM | POA: Diagnosis not present

## 2019-01-13 DIAGNOSIS — Z452 Encounter for adjustment and management of vascular access device: Secondary | ICD-10-CM | POA: Diagnosis not present

## 2019-01-13 DIAGNOSIS — N2581 Secondary hyperparathyroidism of renal origin: Secondary | ICD-10-CM | POA: Diagnosis not present

## 2019-01-15 DIAGNOSIS — D689 Coagulation defect, unspecified: Secondary | ICD-10-CM | POA: Diagnosis not present

## 2019-01-15 DIAGNOSIS — E1129 Type 2 diabetes mellitus with other diabetic kidney complication: Secondary | ICD-10-CM | POA: Diagnosis not present

## 2019-01-15 DIAGNOSIS — N186 End stage renal disease: Secondary | ICD-10-CM | POA: Diagnosis not present

## 2019-01-15 DIAGNOSIS — D631 Anemia in chronic kidney disease: Secondary | ICD-10-CM | POA: Diagnosis not present

## 2019-01-15 DIAGNOSIS — N2581 Secondary hyperparathyroidism of renal origin: Secondary | ICD-10-CM | POA: Diagnosis not present

## 2019-01-15 DIAGNOSIS — D509 Iron deficiency anemia, unspecified: Secondary | ICD-10-CM | POA: Diagnosis not present

## 2019-01-15 DIAGNOSIS — Z992 Dependence on renal dialysis: Secondary | ICD-10-CM | POA: Diagnosis not present

## 2019-01-15 DIAGNOSIS — Z452 Encounter for adjustment and management of vascular access device: Secondary | ICD-10-CM | POA: Diagnosis not present

## 2019-01-17 DIAGNOSIS — D689 Coagulation defect, unspecified: Secondary | ICD-10-CM | POA: Diagnosis not present

## 2019-01-17 DIAGNOSIS — Z992 Dependence on renal dialysis: Secondary | ICD-10-CM | POA: Diagnosis not present

## 2019-01-17 DIAGNOSIS — N186 End stage renal disease: Secondary | ICD-10-CM | POA: Diagnosis not present

## 2019-01-17 DIAGNOSIS — D509 Iron deficiency anemia, unspecified: Secondary | ICD-10-CM | POA: Diagnosis not present

## 2019-01-17 DIAGNOSIS — Z452 Encounter for adjustment and management of vascular access device: Secondary | ICD-10-CM | POA: Diagnosis not present

## 2019-01-17 DIAGNOSIS — D631 Anemia in chronic kidney disease: Secondary | ICD-10-CM | POA: Diagnosis not present

## 2019-01-17 DIAGNOSIS — N2581 Secondary hyperparathyroidism of renal origin: Secondary | ICD-10-CM | POA: Diagnosis not present

## 2019-01-17 DIAGNOSIS — E1129 Type 2 diabetes mellitus with other diabetic kidney complication: Secondary | ICD-10-CM | POA: Diagnosis not present

## 2019-01-20 DIAGNOSIS — N186 End stage renal disease: Secondary | ICD-10-CM | POA: Diagnosis not present

## 2019-01-20 DIAGNOSIS — D631 Anemia in chronic kidney disease: Secondary | ICD-10-CM | POA: Diagnosis not present

## 2019-01-20 DIAGNOSIS — E1129 Type 2 diabetes mellitus with other diabetic kidney complication: Secondary | ICD-10-CM | POA: Diagnosis not present

## 2019-01-20 DIAGNOSIS — D509 Iron deficiency anemia, unspecified: Secondary | ICD-10-CM | POA: Diagnosis not present

## 2019-01-20 DIAGNOSIS — Z452 Encounter for adjustment and management of vascular access device: Secondary | ICD-10-CM | POA: Diagnosis not present

## 2019-01-20 DIAGNOSIS — Z992 Dependence on renal dialysis: Secondary | ICD-10-CM | POA: Diagnosis not present

## 2019-01-20 DIAGNOSIS — N2581 Secondary hyperparathyroidism of renal origin: Secondary | ICD-10-CM | POA: Diagnosis not present

## 2019-01-20 DIAGNOSIS — D689 Coagulation defect, unspecified: Secondary | ICD-10-CM | POA: Diagnosis not present

## 2019-01-22 DIAGNOSIS — N2581 Secondary hyperparathyroidism of renal origin: Secondary | ICD-10-CM | POA: Diagnosis not present

## 2019-01-22 DIAGNOSIS — D631 Anemia in chronic kidney disease: Secondary | ICD-10-CM | POA: Diagnosis not present

## 2019-01-22 DIAGNOSIS — Z992 Dependence on renal dialysis: Secondary | ICD-10-CM | POA: Diagnosis not present

## 2019-01-22 DIAGNOSIS — E1129 Type 2 diabetes mellitus with other diabetic kidney complication: Secondary | ICD-10-CM | POA: Diagnosis not present

## 2019-01-22 DIAGNOSIS — D509 Iron deficiency anemia, unspecified: Secondary | ICD-10-CM | POA: Diagnosis not present

## 2019-01-22 DIAGNOSIS — N186 End stage renal disease: Secondary | ICD-10-CM | POA: Diagnosis not present

## 2019-01-22 DIAGNOSIS — Z452 Encounter for adjustment and management of vascular access device: Secondary | ICD-10-CM | POA: Diagnosis not present

## 2019-01-22 DIAGNOSIS — D689 Coagulation defect, unspecified: Secondary | ICD-10-CM | POA: Diagnosis not present

## 2019-01-22 DIAGNOSIS — E1122 Type 2 diabetes mellitus with diabetic chronic kidney disease: Secondary | ICD-10-CM | POA: Diagnosis not present

## 2019-01-23 DIAGNOSIS — H401131 Primary open-angle glaucoma, bilateral, mild stage: Secondary | ICD-10-CM | POA: Diagnosis not present

## 2019-01-23 DIAGNOSIS — E113293 Type 2 diabetes mellitus with mild nonproliferative diabetic retinopathy without macular edema, bilateral: Secondary | ICD-10-CM | POA: Diagnosis not present

## 2019-01-23 DIAGNOSIS — Z794 Long term (current) use of insulin: Secondary | ICD-10-CM | POA: Diagnosis not present

## 2019-01-24 DIAGNOSIS — D631 Anemia in chronic kidney disease: Secondary | ICD-10-CM | POA: Diagnosis not present

## 2019-01-24 DIAGNOSIS — E1129 Type 2 diabetes mellitus with other diabetic kidney complication: Secondary | ICD-10-CM | POA: Diagnosis not present

## 2019-01-24 DIAGNOSIS — N2581 Secondary hyperparathyroidism of renal origin: Secondary | ICD-10-CM | POA: Diagnosis not present

## 2019-01-24 DIAGNOSIS — N186 End stage renal disease: Secondary | ICD-10-CM | POA: Diagnosis not present

## 2019-01-24 DIAGNOSIS — Z992 Dependence on renal dialysis: Secondary | ICD-10-CM | POA: Diagnosis not present

## 2019-01-24 DIAGNOSIS — R519 Headache, unspecified: Secondary | ICD-10-CM | POA: Diagnosis not present

## 2019-01-24 DIAGNOSIS — D689 Coagulation defect, unspecified: Secondary | ICD-10-CM | POA: Diagnosis not present

## 2019-01-27 DIAGNOSIS — D689 Coagulation defect, unspecified: Secondary | ICD-10-CM | POA: Diagnosis not present

## 2019-01-27 DIAGNOSIS — R519 Headache, unspecified: Secondary | ICD-10-CM | POA: Diagnosis not present

## 2019-01-27 DIAGNOSIS — Z992 Dependence on renal dialysis: Secondary | ICD-10-CM | POA: Diagnosis not present

## 2019-01-27 DIAGNOSIS — D631 Anemia in chronic kidney disease: Secondary | ICD-10-CM | POA: Diagnosis not present

## 2019-01-27 DIAGNOSIS — N2581 Secondary hyperparathyroidism of renal origin: Secondary | ICD-10-CM | POA: Diagnosis not present

## 2019-01-27 DIAGNOSIS — E1129 Type 2 diabetes mellitus with other diabetic kidney complication: Secondary | ICD-10-CM | POA: Diagnosis not present

## 2019-01-27 DIAGNOSIS — N186 End stage renal disease: Secondary | ICD-10-CM | POA: Diagnosis not present

## 2019-01-29 DIAGNOSIS — Z992 Dependence on renal dialysis: Secondary | ICD-10-CM | POA: Diagnosis not present

## 2019-01-29 DIAGNOSIS — R519 Headache, unspecified: Secondary | ICD-10-CM | POA: Diagnosis not present

## 2019-01-29 DIAGNOSIS — N2581 Secondary hyperparathyroidism of renal origin: Secondary | ICD-10-CM | POA: Diagnosis not present

## 2019-01-29 DIAGNOSIS — N186 End stage renal disease: Secondary | ICD-10-CM | POA: Diagnosis not present

## 2019-01-29 DIAGNOSIS — D631 Anemia in chronic kidney disease: Secondary | ICD-10-CM | POA: Diagnosis not present

## 2019-01-29 DIAGNOSIS — D689 Coagulation defect, unspecified: Secondary | ICD-10-CM | POA: Diagnosis not present

## 2019-01-29 DIAGNOSIS — E1129 Type 2 diabetes mellitus with other diabetic kidney complication: Secondary | ICD-10-CM | POA: Diagnosis not present

## 2019-01-31 DIAGNOSIS — Z992 Dependence on renal dialysis: Secondary | ICD-10-CM | POA: Diagnosis not present

## 2019-01-31 DIAGNOSIS — E1129 Type 2 diabetes mellitus with other diabetic kidney complication: Secondary | ICD-10-CM | POA: Diagnosis not present

## 2019-01-31 DIAGNOSIS — N2581 Secondary hyperparathyroidism of renal origin: Secondary | ICD-10-CM | POA: Diagnosis not present

## 2019-01-31 DIAGNOSIS — D631 Anemia in chronic kidney disease: Secondary | ICD-10-CM | POA: Diagnosis not present

## 2019-01-31 DIAGNOSIS — R519 Headache, unspecified: Secondary | ICD-10-CM | POA: Diagnosis not present

## 2019-01-31 DIAGNOSIS — D689 Coagulation defect, unspecified: Secondary | ICD-10-CM | POA: Diagnosis not present

## 2019-01-31 DIAGNOSIS — N186 End stage renal disease: Secondary | ICD-10-CM | POA: Diagnosis not present

## 2019-02-03 DIAGNOSIS — D689 Coagulation defect, unspecified: Secondary | ICD-10-CM | POA: Diagnosis not present

## 2019-02-03 DIAGNOSIS — E1129 Type 2 diabetes mellitus with other diabetic kidney complication: Secondary | ICD-10-CM | POA: Diagnosis not present

## 2019-02-03 DIAGNOSIS — N2581 Secondary hyperparathyroidism of renal origin: Secondary | ICD-10-CM | POA: Diagnosis not present

## 2019-02-03 DIAGNOSIS — N186 End stage renal disease: Secondary | ICD-10-CM | POA: Diagnosis not present

## 2019-02-03 DIAGNOSIS — D631 Anemia in chronic kidney disease: Secondary | ICD-10-CM | POA: Diagnosis not present

## 2019-02-03 DIAGNOSIS — Z992 Dependence on renal dialysis: Secondary | ICD-10-CM | POA: Diagnosis not present

## 2019-02-03 DIAGNOSIS — R519 Headache, unspecified: Secondary | ICD-10-CM | POA: Diagnosis not present

## 2019-02-05 DIAGNOSIS — Z992 Dependence on renal dialysis: Secondary | ICD-10-CM | POA: Diagnosis not present

## 2019-02-05 DIAGNOSIS — N2581 Secondary hyperparathyroidism of renal origin: Secondary | ICD-10-CM | POA: Diagnosis not present

## 2019-02-05 DIAGNOSIS — D631 Anemia in chronic kidney disease: Secondary | ICD-10-CM | POA: Diagnosis not present

## 2019-02-05 DIAGNOSIS — D689 Coagulation defect, unspecified: Secondary | ICD-10-CM | POA: Diagnosis not present

## 2019-02-05 DIAGNOSIS — E1129 Type 2 diabetes mellitus with other diabetic kidney complication: Secondary | ICD-10-CM | POA: Diagnosis not present

## 2019-02-05 DIAGNOSIS — R519 Headache, unspecified: Secondary | ICD-10-CM | POA: Diagnosis not present

## 2019-02-05 DIAGNOSIS — N186 End stage renal disease: Secondary | ICD-10-CM | POA: Diagnosis not present

## 2019-02-06 DIAGNOSIS — Z23 Encounter for immunization: Secondary | ICD-10-CM | POA: Diagnosis not present

## 2019-02-06 DIAGNOSIS — G4700 Insomnia, unspecified: Secondary | ICD-10-CM | POA: Diagnosis not present

## 2019-02-06 DIAGNOSIS — D509 Iron deficiency anemia, unspecified: Secondary | ICD-10-CM | POA: Diagnosis not present

## 2019-02-06 DIAGNOSIS — E1140 Type 2 diabetes mellitus with diabetic neuropathy, unspecified: Secondary | ICD-10-CM | POA: Diagnosis not present

## 2019-02-06 DIAGNOSIS — K219 Gastro-esophageal reflux disease without esophagitis: Secondary | ICD-10-CM | POA: Diagnosis not present

## 2019-02-07 DIAGNOSIS — R519 Headache, unspecified: Secondary | ICD-10-CM | POA: Diagnosis not present

## 2019-02-07 DIAGNOSIS — N2581 Secondary hyperparathyroidism of renal origin: Secondary | ICD-10-CM | POA: Diagnosis not present

## 2019-02-07 DIAGNOSIS — E1129 Type 2 diabetes mellitus with other diabetic kidney complication: Secondary | ICD-10-CM | POA: Diagnosis not present

## 2019-02-07 DIAGNOSIS — D631 Anemia in chronic kidney disease: Secondary | ICD-10-CM | POA: Diagnosis not present

## 2019-02-07 DIAGNOSIS — N186 End stage renal disease: Secondary | ICD-10-CM | POA: Diagnosis not present

## 2019-02-07 DIAGNOSIS — D689 Coagulation defect, unspecified: Secondary | ICD-10-CM | POA: Diagnosis not present

## 2019-02-07 DIAGNOSIS — Z992 Dependence on renal dialysis: Secondary | ICD-10-CM | POA: Diagnosis not present

## 2019-02-10 DIAGNOSIS — R519 Headache, unspecified: Secondary | ICD-10-CM | POA: Diagnosis not present

## 2019-02-10 DIAGNOSIS — Z992 Dependence on renal dialysis: Secondary | ICD-10-CM | POA: Diagnosis not present

## 2019-02-10 DIAGNOSIS — D631 Anemia in chronic kidney disease: Secondary | ICD-10-CM | POA: Diagnosis not present

## 2019-02-10 DIAGNOSIS — D689 Coagulation defect, unspecified: Secondary | ICD-10-CM | POA: Diagnosis not present

## 2019-02-10 DIAGNOSIS — E1129 Type 2 diabetes mellitus with other diabetic kidney complication: Secondary | ICD-10-CM | POA: Diagnosis not present

## 2019-02-10 DIAGNOSIS — N2581 Secondary hyperparathyroidism of renal origin: Secondary | ICD-10-CM | POA: Diagnosis not present

## 2019-02-10 DIAGNOSIS — N186 End stage renal disease: Secondary | ICD-10-CM | POA: Diagnosis not present

## 2019-02-12 DIAGNOSIS — D689 Coagulation defect, unspecified: Secondary | ICD-10-CM | POA: Diagnosis not present

## 2019-02-12 DIAGNOSIS — D631 Anemia in chronic kidney disease: Secondary | ICD-10-CM | POA: Diagnosis not present

## 2019-02-12 DIAGNOSIS — N2581 Secondary hyperparathyroidism of renal origin: Secondary | ICD-10-CM | POA: Diagnosis not present

## 2019-02-12 DIAGNOSIS — R519 Headache, unspecified: Secondary | ICD-10-CM | POA: Diagnosis not present

## 2019-02-12 DIAGNOSIS — E1129 Type 2 diabetes mellitus with other diabetic kidney complication: Secondary | ICD-10-CM | POA: Diagnosis not present

## 2019-02-12 DIAGNOSIS — Z992 Dependence on renal dialysis: Secondary | ICD-10-CM | POA: Diagnosis not present

## 2019-02-12 DIAGNOSIS — N186 End stage renal disease: Secondary | ICD-10-CM | POA: Diagnosis not present

## 2019-02-14 DIAGNOSIS — D689 Coagulation defect, unspecified: Secondary | ICD-10-CM | POA: Diagnosis not present

## 2019-02-14 DIAGNOSIS — N186 End stage renal disease: Secondary | ICD-10-CM | POA: Diagnosis not present

## 2019-02-14 DIAGNOSIS — N2581 Secondary hyperparathyroidism of renal origin: Secondary | ICD-10-CM | POA: Diagnosis not present

## 2019-02-14 DIAGNOSIS — Z992 Dependence on renal dialysis: Secondary | ICD-10-CM | POA: Diagnosis not present

## 2019-02-14 DIAGNOSIS — D631 Anemia in chronic kidney disease: Secondary | ICD-10-CM | POA: Diagnosis not present

## 2019-02-14 DIAGNOSIS — E1129 Type 2 diabetes mellitus with other diabetic kidney complication: Secondary | ICD-10-CM | POA: Diagnosis not present

## 2019-02-14 DIAGNOSIS — R519 Headache, unspecified: Secondary | ICD-10-CM | POA: Diagnosis not present

## 2019-02-17 DIAGNOSIS — Z992 Dependence on renal dialysis: Secondary | ICD-10-CM | POA: Diagnosis not present

## 2019-02-17 DIAGNOSIS — N2581 Secondary hyperparathyroidism of renal origin: Secondary | ICD-10-CM | POA: Diagnosis not present

## 2019-02-17 DIAGNOSIS — E1129 Type 2 diabetes mellitus with other diabetic kidney complication: Secondary | ICD-10-CM | POA: Diagnosis not present

## 2019-02-17 DIAGNOSIS — D689 Coagulation defect, unspecified: Secondary | ICD-10-CM | POA: Diagnosis not present

## 2019-02-17 DIAGNOSIS — R519 Headache, unspecified: Secondary | ICD-10-CM | POA: Diagnosis not present

## 2019-02-17 DIAGNOSIS — N186 End stage renal disease: Secondary | ICD-10-CM | POA: Diagnosis not present

## 2019-02-17 DIAGNOSIS — D631 Anemia in chronic kidney disease: Secondary | ICD-10-CM | POA: Diagnosis not present

## 2019-02-19 DIAGNOSIS — D631 Anemia in chronic kidney disease: Secondary | ICD-10-CM | POA: Diagnosis not present

## 2019-02-19 DIAGNOSIS — R519 Headache, unspecified: Secondary | ICD-10-CM | POA: Diagnosis not present

## 2019-02-19 DIAGNOSIS — N186 End stage renal disease: Secondary | ICD-10-CM | POA: Diagnosis not present

## 2019-02-19 DIAGNOSIS — E1129 Type 2 diabetes mellitus with other diabetic kidney complication: Secondary | ICD-10-CM | POA: Diagnosis not present

## 2019-02-19 DIAGNOSIS — N2581 Secondary hyperparathyroidism of renal origin: Secondary | ICD-10-CM | POA: Diagnosis not present

## 2019-02-19 DIAGNOSIS — Z992 Dependence on renal dialysis: Secondary | ICD-10-CM | POA: Diagnosis not present

## 2019-02-19 DIAGNOSIS — D689 Coagulation defect, unspecified: Secondary | ICD-10-CM | POA: Diagnosis not present

## 2019-02-20 ENCOUNTER — Other Ambulatory Visit: Payer: Self-pay

## 2019-02-20 ENCOUNTER — Encounter: Payer: Self-pay | Admitting: Sports Medicine

## 2019-02-20 ENCOUNTER — Ambulatory Visit (INDEPENDENT_AMBULATORY_CARE_PROVIDER_SITE_OTHER): Payer: Medicare Other | Admitting: Sports Medicine

## 2019-02-20 DIAGNOSIS — E1142 Type 2 diabetes mellitus with diabetic polyneuropathy: Secondary | ICD-10-CM

## 2019-02-20 DIAGNOSIS — M2041 Other hammer toe(s) (acquired), right foot: Secondary | ICD-10-CM

## 2019-02-20 DIAGNOSIS — B351 Tinea unguium: Secondary | ICD-10-CM | POA: Diagnosis not present

## 2019-02-20 DIAGNOSIS — M79674 Pain in right toe(s): Secondary | ICD-10-CM

## 2019-02-20 DIAGNOSIS — N186 End stage renal disease: Secondary | ICD-10-CM

## 2019-02-20 DIAGNOSIS — L608 Other nail disorders: Secondary | ICD-10-CM

## 2019-02-20 DIAGNOSIS — M79675 Pain in left toe(s): Secondary | ICD-10-CM | POA: Diagnosis not present

## 2019-02-20 DIAGNOSIS — M2042 Other hammer toe(s) (acquired), left foot: Secondary | ICD-10-CM

## 2019-02-20 DIAGNOSIS — Z992 Dependence on renal dialysis: Secondary | ICD-10-CM

## 2019-02-20 NOTE — Progress Notes (Signed)
Subjective: James Chan is a 68 y.o. male patient with history of diabetes who presents to office today complaining of long, painful nails  while ambulating in shoes; unable to trim. Patient states that the glucose reading this morning was 112 mg/dl last A1c 4.8 and last visit to PCP was 2 weeks ago.  Patient also reports that he has noticed that his left big toe has turned black and remember its getting this way after his last trim but no pain no swelling and reports the only difference he remembers is getting a new pair of shoes. Patient denies any new changes in medication or new problems.   Patient Active Problem List   Diagnosis Date Noted  . CKD (chronic kidney disease) stage V requiring chronic dialysis ([place]) 10/07/2018  . Mitral regurgitation 02/05/2018  . Bradycardia 07/02/2017  . Iron deficiency anemia 07/02/2017  . CKD (chronic kidney disease) 04/11/2017  . PAF (paroxysmal atrial fibrillation) ([place]) 02/27/2017  . Chronic combined systolic and diastolic heart failure ([place]) 11/28/2016  . Hypertensive heart disease 11/28/2016  . On amiodarone therapy 11/28/2016  . Chronic anticoagulation 11/28/2016  . Erectile dysfunction 11/28/2016  . Arterial insufficiency of lower extremity ([place]) 05/10/2016  . Coronary artery disease involving native coronary artery of native heart without angina pectoris 11/24/2015  . Dyslipidemia 11/24/2015  . Diabetic polyneuropathy associated with diabetes mellitus due to underlying condition ([place]) 06/28/2015  . Hammer toes of both feet 06/28/2015  . Onychomycosis due to dermatophyte 06/28/2015  . Type 2 diabetes, controlled, with peripheral circulatory disorder ([place]) 06/28/2015  . Claudication ([place]) 10/30/2014  . History of carotid artery stenosis 10/30/2014  . 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 PAD (peripheral artery disease) ([place]) 10/30/2014  . Comprehensive diabetic foot examination, type 2 DM, encounter for ([place])  09/15/2008  . HYPERTRIGLYCERIDEMIA 09/15/2008  . ANXIETY STATE, UNSPECIFIED 09/15/2008  . DEPRESSIVE DISORDER 09/15/2008  . NSTEMI (non-ST elevated myocardial infarction) ([place]) 09/15/2008  . ESOPHAGEAL REFLUX 09/15/2008  . BACK PAIN, LUMBAR, CHRONIC 09/15/2008   Current Outpatient Medications on File Prior to Visit  Medication Sig Dispense Refill  . allopurinol (ZYLOPRIM) 100 MG tablet Take 100 mg by mouth at bedtime.     Marland Kitchen amiodarone (PACERONE) 200 MG tablet TAKE 1 TABLET BY MOUTH  DAILY 90 tablet 3  . aspirin EC 81 MG tablet Take 81 mg by mouth daily.    Marland Kitchen atorvastatin (LIPITOR) 40 MG tablet Take 40 mg by mouth every evening.     . cloNIDine (CATAPRES) 0.1 MG tablet On NON-hemodialysis days take 0.1mg  (one tablet) in the morning. Take 0.1mg  (one tablet) in the evening on non-hemodialysis days if systolic blood pressure is >180. 90 tablet 1  . clopidogrel (PLAVIX) 75 MG tablet Take 75 mg by mouth daily with supper.     . hydrALAZINE (APRESOLINE) 25 MG tablet Take 25 mg by mouth as directed. Take 25mg  BID on Mon, Wed, and Fri.  Take 50mg  TID on Tues, Thurs, Sat, and Sun    . insulin degludec (TRESIBA FLEXTOUCH) 100 UNIT/ML SOPN FlexTouch Pen Inject 15-30 Units into the skin See admin instructions. Inject 15-30 units twice daily    . latanoprost (XALATAN) 0.005 % ophthalmic solution Place 1 drop into both eyes at bedtime.    . metoprolol tartrate (LOPRESSOR) 25 MG tablet Take 25 mg by mouth daily.     . nitroGLYCERIN (NITROSTAT) 0.4 MG SL tablet Place 1 tablet (0.4 mg total) under the tongue every 5 (five) minutes as  needed for chest pain. 25 tablet 3  . oxyCODONE-acetaminophen (PERCOCET) 5-325 MG tablet Take 1 tablet by mouth every 4 (four) hours as needed for severe pain. 20 tablet 0  . polyethylene glycol (MIRALAX / GLYCOLAX) packet Take 17 g by mouth daily as needed for mild constipation.    Marland Kitchen PROAIR HFA 108 (90 Base) MCG/ACT inhaler Inhale 2 puffs into the lungs every 6 (six) hours as  needed for wheezing or shortness of breath.   12   No current facility-administered medications on file prior to visit.    No Known Allergies  Recent Results (from the past 2160 hour(s))  Comprehensive Metabolic Panel (CMET)     Status: Abnormal   Collection Time: 12/05/18 10:10 AM  Result Value Ref Range   Glucose 73 65 - 99 mg/dL   BUN 32 (H) 8 - 27 mg/dL   Creatinine, Ser 3.75 (H) 0.76 - 1.27 mg/dL   GFR calc non Af Amer 16 (L) >59 mL/min/1.73   GFR calc Af Amer 18 (L) >59 mL/min/1.73   BUN/Creatinine Ratio 9 (L) 10 - 24   Sodium 138 134 - 144 mmol/L   Potassium 4.6 3.5 - 5.2 mmol/L   Chloride 95 (L) 96 - 106 mmol/L   CO2 27 20 - 29 mmol/L   Calcium 9.5 8.6 - 10.2 mg/dL   Total Protein 6.9 6.0 - 8.5 g/dL   Albumin 4.3 3.8 - 4.8 g/dL   Globulin, Total 2.6 1.5 - 4.5 g/dL   Albumin/Globulin Ratio 1.7 1.2 - 2.2   Bilirubin Total 0.5 0.0 - 1.2 mg/dL   Alkaline Phosphatase 117 39 - 117 IU/L   AST 20 0 - 40 IU/L   ALT 15 0 - 44 IU/L  TSH     Status: None   Collection Time: 12/05/18 10:10 AM  Result Value Ref Range   TSH 3.740 0.450 - 4.500 uIU/mL  Lipid Profile     Status: None   Collection Time: 12/05/18 10:10 AM  Result Value Ref Range   Cholesterol, Total 155 100 - 199 mg/dL   Triglycerides 90 0 - 149 mg/dL   HDL 40 >39 mg/dL   VLDL Cholesterol Cal 18 5 - 40 mg/dL   LDL Calculated 97 0 - 99 mg/dL   Chol/HDL Ratio 3.9 0.0 - 5.0 ratio    Comment:                                   T. Chol/HDL Ratio                                             Men  Women                               1/2 Avg.Risk  3.4    3.3                                   Avg.Risk  5.0    4.4                                2X Avg.Risk  9.6  7.1                                3X Avg.Risk 23.4   11.0     Objective: General: Patient is awake, alert, and oriented x 3 and in no acute distress.  Integument: Skin is warm, dry and supple bilateral. Nails are tender, long, thickened and  dystrophic with  subungual debris, consistent with onychomycosis, 1-5 bilateral.  There is dried blood without signs of infection at the left great toe nail with trauma line.  No signs of infection. No open lesions or preulcerative lesions present bilateral. Remaining integument unremarkable.  Vasculature:  Dorsalis Pedis pulse 1/4 bilateral. Posterior Tibial pulse  1/4 bilateral.  Capillary fill time <3 sec 1-5 bilateral. Positive hair growth to the level of the digits. Temperature gradient within normal limits. No varicosities present bilateral. No edema present bilateral.   Neurology: The patient has absent sensation measured with a 5.07/10g Semmes Weinstein Monofilament at all pedal sites bilateral. Vibratory sensation absent bilateral with tuning fork. No Babinski sign present bilateral.   Musculoskeletal: Hammertoe pedal deformities noted bilateral. Muscular strength 5/5 in all lower extremity muscular groups bilateral without pain on range of motion . No tenderness with calf compression bilateral.  Assessment and Plan: Problem List Items Addressed This Visit      Musculoskeletal and Integument   Hammer toes of both feet    Other Visit Diagnoses    Pain due to onychomycosis of toenails of both feet    -  Primary   Hemorrhage of nail       Diabetic polyneuropathy associated with type 2 diabetes mellitus ([place])       ESRD on dialysis [facility])         -Examined patient. -Discussed and educated patient on diabetic foot care, especially with  regards to the vascular, neurological and musculoskeletal systems.  -Mechanically debrided all nails 1-5 bilateral using sterile nail nipper and filed with dremel without incident  -Advised patient that we will closely monitor left hallux nail and that the dried blood will slowly grow out with the nail over time to be very careful that he is not wearing shoes that are too tight especially states he has neuropathy and cannot feel the toes -Answered all patient  questions -Patient to return  in 3 months for at risk foot care -Patient advised to call the office if any problems or questions arise in the meantime.  Landis Martins, DPM

## 2019-02-21 DIAGNOSIS — D631 Anemia in chronic kidney disease: Secondary | ICD-10-CM | POA: Diagnosis not present

## 2019-02-21 DIAGNOSIS — N186 End stage renal disease: Secondary | ICD-10-CM | POA: Diagnosis not present

## 2019-02-21 DIAGNOSIS — R519 Headache, unspecified: Secondary | ICD-10-CM | POA: Diagnosis not present

## 2019-02-21 DIAGNOSIS — E1129 Type 2 diabetes mellitus with other diabetic kidney complication: Secondary | ICD-10-CM | POA: Diagnosis not present

## 2019-02-21 DIAGNOSIS — N2581 Secondary hyperparathyroidism of renal origin: Secondary | ICD-10-CM | POA: Diagnosis not present

## 2019-02-21 DIAGNOSIS — Z992 Dependence on renal dialysis: Secondary | ICD-10-CM | POA: Diagnosis not present

## 2019-02-21 DIAGNOSIS — D689 Coagulation defect, unspecified: Secondary | ICD-10-CM | POA: Diagnosis not present

## 2019-02-22 DIAGNOSIS — Z992 Dependence on renal dialysis: Secondary | ICD-10-CM | POA: Diagnosis not present

## 2019-02-22 DIAGNOSIS — N186 End stage renal disease: Secondary | ICD-10-CM | POA: Diagnosis not present

## 2019-02-22 DIAGNOSIS — E1122 Type 2 diabetes mellitus with diabetic chronic kidney disease: Secondary | ICD-10-CM | POA: Diagnosis not present

## 2019-02-24 DIAGNOSIS — D509 Iron deficiency anemia, unspecified: Secondary | ICD-10-CM | POA: Diagnosis not present

## 2019-02-24 DIAGNOSIS — D689 Coagulation defect, unspecified: Secondary | ICD-10-CM | POA: Diagnosis not present

## 2019-02-24 DIAGNOSIS — Z992 Dependence on renal dialysis: Secondary | ICD-10-CM | POA: Diagnosis not present

## 2019-02-24 DIAGNOSIS — E1129 Type 2 diabetes mellitus with other diabetic kidney complication: Secondary | ICD-10-CM | POA: Diagnosis not present

## 2019-02-24 DIAGNOSIS — D631 Anemia in chronic kidney disease: Secondary | ICD-10-CM | POA: Diagnosis not present

## 2019-02-24 DIAGNOSIS — N2581 Secondary hyperparathyroidism of renal origin: Secondary | ICD-10-CM | POA: Diagnosis not present

## 2019-02-24 DIAGNOSIS — N186 End stage renal disease: Secondary | ICD-10-CM | POA: Diagnosis not present

## 2019-02-25 DIAGNOSIS — Z0000 Encounter for general adult medical examination without abnormal findings: Secondary | ICD-10-CM | POA: Diagnosis not present

## 2019-02-25 DIAGNOSIS — E785 Hyperlipidemia, unspecified: Secondary | ICD-10-CM | POA: Diagnosis not present

## 2019-02-25 DIAGNOSIS — Z9181 History of falling: Secondary | ICD-10-CM | POA: Diagnosis not present

## 2019-02-26 DIAGNOSIS — N2581 Secondary hyperparathyroidism of renal origin: Secondary | ICD-10-CM | POA: Diagnosis not present

## 2019-02-26 DIAGNOSIS — E1129 Type 2 diabetes mellitus with other diabetic kidney complication: Secondary | ICD-10-CM | POA: Diagnosis not present

## 2019-02-26 DIAGNOSIS — N186 End stage renal disease: Secondary | ICD-10-CM | POA: Diagnosis not present

## 2019-02-26 DIAGNOSIS — D509 Iron deficiency anemia, unspecified: Secondary | ICD-10-CM | POA: Diagnosis not present

## 2019-02-26 DIAGNOSIS — D689 Coagulation defect, unspecified: Secondary | ICD-10-CM | POA: Diagnosis not present

## 2019-02-26 DIAGNOSIS — Z992 Dependence on renal dialysis: Secondary | ICD-10-CM | POA: Diagnosis not present

## 2019-02-26 DIAGNOSIS — D631 Anemia in chronic kidney disease: Secondary | ICD-10-CM | POA: Diagnosis not present

## 2019-02-28 DIAGNOSIS — E1129 Type 2 diabetes mellitus with other diabetic kidney complication: Secondary | ICD-10-CM | POA: Diagnosis not present

## 2019-02-28 DIAGNOSIS — Z992 Dependence on renal dialysis: Secondary | ICD-10-CM | POA: Diagnosis not present

## 2019-02-28 DIAGNOSIS — N186 End stage renal disease: Secondary | ICD-10-CM | POA: Diagnosis not present

## 2019-02-28 DIAGNOSIS — D509 Iron deficiency anemia, unspecified: Secondary | ICD-10-CM | POA: Diagnosis not present

## 2019-02-28 DIAGNOSIS — D689 Coagulation defect, unspecified: Secondary | ICD-10-CM | POA: Diagnosis not present

## 2019-02-28 DIAGNOSIS — D631 Anemia in chronic kidney disease: Secondary | ICD-10-CM | POA: Diagnosis not present

## 2019-02-28 DIAGNOSIS — N2581 Secondary hyperparathyroidism of renal origin: Secondary | ICD-10-CM | POA: Diagnosis not present

## 2019-03-02 NOTE — Progress Notes (Signed)
Cardiology Office Note:    Date:  03/04/2019   ID:  James Chan, DOB [birth year], MRN [id number]  PCP:  Cher Nakai, MD  Cardiologist:  Shirlee More, MD    Referring MD: Cher Nakai, MD    ASSESSMENT:    1. Coronary artery disease involving native coronary artery of native heart without angina pectoris   2. PAF (paroxysmal atrial fibrillation) ([place])   3. On amiodarone therapy   4. Chronic anticoagulation   5. Chronic combined systolic and diastolic heart failure ([place])   6. Hypertensive heart disease with chronic combined systolic and diastolic congestive heart failure ([place])   7. Mixed hyperlipidemia   8. CKD (chronic kidney disease) stage V requiring chronic dialysis (HCC)   9. Other iron deficiency anemia    PLAN:    In order of problems listed above:  1. Stable CAD continue medical therapy now on single antiplatelet lipid-lowering treatment antihypertensives.  At this time I do not think he requires an ischemia evaluation.  If he is having frequent angina I would favor coronary angiography with his high risk status with stage V CKD and renal replacement therapy 2. Stable no recurrence now on single antiplatelet agent continue low-dose amiodarone I remind the nephrology team he needs a TSH and liver function every 6 months.  I asked him to prompt the team to send me his last full labs.  At this time I would not anticoagulate 3. Continue low-dose amiodarone no adjustments for renal failure 4. Continue single antiplatelet agent 5. Heart failure is improved still has a mild degree of residual edema related to hypotension but is following a good dry weight. 6. Improved hypertension managed by the nephrology team to avoid intradialysis hypotension 7. Table continue his high intensity statin with CAD 8. Stable managed by nephrology 9. Stable managed by nephrology related to iron deficiency and CKD   Next appointment: 4 months   Medication Adjustments/Labs and Tests Ordered:  Current medicines are reviewed at length with the patient today.  Concerns regarding medicines are outlined above.  No orders of the defined types were placed in this encounter.  No orders of the defined types were placed in this encounter.   Chief Complaint  Patient presents with  . Follow-up  . Congestive Heart Failure  . Coronary Artery Disease    History of Present Illness:    James Chan is a 69 y.o. male with a hx of CAD s/p NSTEMI 04/19/16 with PCI of ISR of RCA with 2 DES and multiple PCI of RCA for restenosis, brief PAF on amiodarone, diastolic CHF, DLD, HTN, PVD, ESRD on HD and anemia. Echo 11/15/2017 with EF 50-55%, mild LVH, mild AR, mod to severe MR, diastolic dysfunction.  He was last seen 12/23/2018 with labile hypertension related to HD. Compliance with diet, lifestyle and medications: Yes  Overall he is doing better and is settled into hemodialysis his weight dry is 191 pounds and often goes up to 199 before dialysis still has intermittent episodes of brief hypotension and cramping.  I told him to explore over-the-counter quinine prior to dialysis.  He is not short of breath he has a little bit of edema no orthopnea chest pain palpitation or syncope but at times he feels weak and lightheaded he has heart rate of approximately 50 bpm with sick sinus syndrome.  Has history of CAD and heart failure and high risk he will undergo echocardiogram if EF is severely reduced would benefit from additional drug guideline directed  drug therapy in view of his arrhythmia have asked him to wear a 3-day ZIO monitor continue amiodarone.  Aspirin was stopped due to excessive bleeding but remains on clopidogrel.  Hypertension is now managed by the nephrology team primarily Dr. Joelyn Oms recent labs 12/05/2018 has a cholesterol 155 LDL 97 HDL 40 normal TSH. Past Medical History:  Diagnosis Date  . Anemia   . Anxiety state, unspecified 09/15/2008   Qualifier: Diagnosis of  By: Bullins CMA, Ami  ,  patient denies  . Arterial insufficiency of lower extremity ([place]) 05/10/2016  . Arthritis    elbows  . BACK PAIN, LUMBAR, CHRONIC 09/15/2008   Qualifier: Diagnosis of  By: Bullins CMA, Ami    . CHF (congestive heart failure) ([place])   . Claudication ([place]) 10/30/2014  . Comprehensive diabetic foot examination, type 2 DM, encounter for [facility]) 09/15/2008   Qualifier: Diagnosis of  By: Bullins CMA, Ami    . DEPRESSIVE DISORDER 09/15/2008   Qualifier: Diagnosis of  By: Bullins CMA, Ami    . Diabetes mellitus without complication ([place])   . Diabetic polyneuropathy associated with diabetes mellitus due to underlying condition ([place]) 06/28/2015  . Dyslipidemia 11/24/2015  . Dyspnea    with exertion  . End stage renal disease on dialysis ([place])    M/W/F [place]  . Esophageal reflux 09/15/2008   Qualifier: Diagnosis of  By: Bullins CMA, Ami    . Glaucoma   . Gout   . Hammer toes of both feet 06/28/2015  . History of blood transfusion   . History of carotid artery stenosis 10/30/2014  . 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 Hyperlipidemia   . Hypertension   . Hypertensive heart failure ([place]) 11/28/2016  . HYPERTRIGLYCERIDEMIA 09/15/2008   Qualifier: Diagnosis of  By: Bullins CMA, Ami    . Myocardial infarction ([place]) 1997  . NSTEMI (non-ST elevated myocardial infarction) ([place]) 09/15/2008   Qualifier: Diagnosis of  By: Bullins CMA, Ami    . Onychomycosis due to dermatophyte 06/28/2015  . PAD (peripheral artery disease) ([place]) 10/30/2014  . Sleep apnea    no CPAP  . Stroke [facility])    no residual    Past Surgical History:  Procedure Laterality Date  . angioplasty of iliofemoral and iliac artery with stent placement    . AV FISTULA PLACEMENT Right 07/04/2018   Procedure: CREATION BASILIC VEIN ARTERIOVENOUS FISTULA RIGHT ARM;  Surgeon: [name], MD;  Location: [place];  Service: Vascular;  Laterality: Right;  . BASCILIC VEIN TRANSPOSITION Right 10/10/2018   Procedure: BASILIC VEIN  TRANSPOSITION SECOND STAGE Right upper arm;  Surgeon: [name], MD;  Location: [place];  Service: Vascular;  Laterality: Right;  . CARDIAC CATHETERIZATION    . CAROTID STENT Bilateral   . CATARACT EXTRACTION W/ INTRAOCULAR LENS  IMPLANT, BILATERAL    . COLONOSCOPY W/ POLYPECTOMY    . CORONARY ANGIOPLASTY     stents x 5  . INSERTION OF DIALYSIS CATHETER    . KNEE SURGERY     right/ left worked on ligaments and tendons  . OTHER SURGICAL HISTORY     reports 32 stents to include being in eye    Current Medications: Current Meds  Medication Sig  . allopurinol (ZYLOPRIM) 100 MG tablet Take 100 mg by mouth at bedtime.   Marland Kitchen amiodarone (PACERONE) 200 MG tablet TAKE 1 TABLET BY MOUTH  DAILY  . atorvastatin (LIPITOR) 40 MG tablet Take 40 mg by mouth every evening.   Marland Kitchen  cloNIDine (CATAPRES) 0.1 MG tablet Take 0.1mg  (one tablet) on non-hemodialysis days if systolic blood pressure is >180.  . clopidogrel (PLAVIX) 75 MG tablet Take 75 mg by mouth daily with supper.   . hydrALAZINE (APRESOLINE) 25 MG tablet Take 25 mg by mouth as directed. Take 25mg  BID on Mon, Wed, and Fri.  Take 50mg  TID on Tues, Thurs, Sat, and Sun  . insulin degludec (TRESIBA FLEXTOUCH) 100 UNIT/ML SOPN FlexTouch Pen Inject 15-30 Units into the skin See admin instructions. Inject 15-30 units twice daily  . latanoprost (XALATAN) 0.005 % ophthalmic solution Place 1 drop into both eyes at bedtime.  . metoprolol tartrate (LOPRESSOR) 25 MG tablet Take 25 mg by mouth daily.   . nitroGLYCERIN (NITROSTAT) 0.4 MG SL tablet Place 1 tablet (0.4 mg total) under the tongue every 5 (five) minutes as needed for chest pain.  Marland Kitchen oxyCODONE-acetaminophen (PERCOCET) 5-325 MG tablet Take 1 tablet by mouth every 4 (four) hours as needed for severe pain.  . polyethylene glycol (MIRALAX / GLYCOLAX) packet Take 17 g by mouth daily as needed for mild constipation.  Marland Kitchen PROAIR HFA 108 (90 Base) MCG/ACT inhaler Inhale 2 puffs into the lungs every 6 (six)  hours as needed for wheezing or shortness of breath.      Allergies:   Patient has no known allergies.   Social History   Socioeconomic History  . Marital status: Married    Spouse name: Not on file  . Number of children: Not on file  . Years of education: Not on file  . Highest education level: Not on file  Occupational History  . Not on file  Social Needs  . Financial resource strain: Not on file  . Food insecurity    Worry: Not on file    Inability: Not on file  . Transportation needs    Medical: Not on file    Non-medical: Not on file  Tobacco Use  . Smoking status: Former Smoker    Years: 36.00    Quit date: 05/10/1995    Years since quitting: 23.8  . Smokeless tobacco: Never Used  Substance and Sexual Activity  . Alcohol use: No  . Drug use: No  . Sexual activity: Not on file  Lifestyle  . Physical activity    Days per week: Not on file    Minutes per session: Not on file  . Stress: Not on file  Relationships  . Social Herbalist on phone: Not on file    Gets together: Not on file    Attends religious service: Not on file    Active member of club or organization: Not on file    Attends meetings of clubs or organizations: Not on file    Relationship status: Not on file  Other Topics Concern  . Not on file  Social History Narrative  . Not on file     Family History: The patient's family history includes Cancer in his father and mother; Heart disease in his father and mother. ROS:   Please see the history of present illness.    All other systems reviewed and are negative.  EKGs/Labs/Other Studies Reviewed:    The following studies were reviewed today:  EKG:  EKG ordered today and personally reviewed.  The ekg ordered today demonstrates sinus rhythm nonspecific conduction delay bradycardic first-degree heart block small inferior Q waves ST-T abnormality  Recent Labs: 05/09/2018: Platelets 522 10/10/2018: Hemoglobin 10.2 12/05/2018: ALT 15;  BUN 32; Creatinine, Ser  3.75; Potassium 4.6; Sodium 138; TSH 3.740  Recent Lipid Panel    Component Value Date/Time   CHOL 155 12/05/2018 1010   TRIG 90 12/05/2018 1010   HDL 40 12/05/2018 1010   CHOLHDL 3.9 12/05/2018 1010   LDLCALC 97 12/05/2018 1010    Physical Exam:    VS:  BP (!) 162/60 (BP Location: Left Arm, Patient Position: Sitting, Cuff Size: Normal)   Pulse (!) 52   Ht 6' (1.829 m)   Wt 203 lb 12.8 oz (92.4 kg)   SpO2 98%   BMI 27.64 kg/m     Wt Readings from Last 3 Encounters:  03/04/19 203 lb 12.8 oz (92.4 kg)  12/05/18 192 lb 12.8 oz (87.5 kg)  10/15/18 194 lb 6.4 oz (88.2 kg)     GEN:  Well nourished, well developed in no acute distress HEENT: Normal NECK: No JVD; No carotid bruits LYMPHATICS: No lymphadenopathy CARDIAC: RRR, no murmurs, rubs, gallops RESPIRATORY:  Clear to auscultation without rales, wheezing or rhonchi  ABDOMEN: Soft, non-tender, non-distended MUSCULOSKELETAL: Has +1 bilateral ankle to knee pitting edema; No deformity  SKIN: Warm and dry NEUROLOGIC:  Alert and oriented x 3 PSYCHIATRIC:  Normal affect    Signed, Shirlee More, MD  03/04/2019 8:48 AM    [facility]

## 2019-03-03 DIAGNOSIS — E1129 Type 2 diabetes mellitus with other diabetic kidney complication: Secondary | ICD-10-CM | POA: Diagnosis not present

## 2019-03-03 DIAGNOSIS — D509 Iron deficiency anemia, unspecified: Secondary | ICD-10-CM | POA: Diagnosis not present

## 2019-03-03 DIAGNOSIS — D689 Coagulation defect, unspecified: Secondary | ICD-10-CM | POA: Diagnosis not present

## 2019-03-03 DIAGNOSIS — N2581 Secondary hyperparathyroidism of renal origin: Secondary | ICD-10-CM | POA: Diagnosis not present

## 2019-03-03 DIAGNOSIS — D631 Anemia in chronic kidney disease: Secondary | ICD-10-CM | POA: Diagnosis not present

## 2019-03-03 DIAGNOSIS — Z992 Dependence on renal dialysis: Secondary | ICD-10-CM | POA: Diagnosis not present

## 2019-03-03 DIAGNOSIS — N186 End stage renal disease: Secondary | ICD-10-CM | POA: Diagnosis not present

## 2019-03-04 ENCOUNTER — Other Ambulatory Visit: Payer: Self-pay

## 2019-03-04 ENCOUNTER — Encounter: Payer: Self-pay | Admitting: Cardiology

## 2019-03-04 ENCOUNTER — Ambulatory Visit (INDEPENDENT_AMBULATORY_CARE_PROVIDER_SITE_OTHER): Payer: Medicare Other | Admitting: Cardiology

## 2019-03-04 VITALS — BP 162/60 | HR 52 | Ht 72.0 in | Wt 203.8 lb

## 2019-03-04 DIAGNOSIS — I452 Bifascicular block: Secondary | ICD-10-CM

## 2019-03-04 DIAGNOSIS — R001 Bradycardia, unspecified: Secondary | ICD-10-CM

## 2019-03-04 DIAGNOSIS — Z7901 Long term (current) use of anticoagulants: Secondary | ICD-10-CM | POA: Diagnosis not present

## 2019-03-04 DIAGNOSIS — Z992 Dependence on renal dialysis: Secondary | ICD-10-CM

## 2019-03-04 DIAGNOSIS — I110 Hypertensive heart disease with heart failure: Secondary | ICD-10-CM

## 2019-03-04 DIAGNOSIS — I480 Paroxysmal atrial fibrillation: Secondary | ICD-10-CM

## 2019-03-04 DIAGNOSIS — E782 Mixed hyperlipidemia: Secondary | ICD-10-CM

## 2019-03-04 DIAGNOSIS — I2510 Atherosclerotic heart disease of native coronary artery without angina pectoris: Secondary | ICD-10-CM | POA: Diagnosis not present

## 2019-03-04 DIAGNOSIS — Z79899 Other long term (current) drug therapy: Secondary | ICD-10-CM | POA: Diagnosis not present

## 2019-03-04 DIAGNOSIS — I5042 Chronic combined systolic (congestive) and diastolic (congestive) heart failure: Secondary | ICD-10-CM

## 2019-03-04 DIAGNOSIS — D508 Other iron deficiency anemias: Secondary | ICD-10-CM

## 2019-03-04 DIAGNOSIS — N186 End stage renal disease: Secondary | ICD-10-CM

## 2019-03-04 MED ORDER — ATORVASTATIN CALCIUM 40 MG PO TABS: 40.0000 mg | ORAL_TABLET | Freq: Every evening | ORAL | 1 refills | Status: DC

## 2019-03-04 NOTE — Patient Instructions (Signed)
Medication Instructions:  Your physician recommends that you continue on your current medications as directed. Please refer to the Current Medication list given to you today.  **Drink over the counter tonic water [place] [employer] or Schweppes (label should contain quinine). Drink 8 ounces before your hemodialysis appointments!    *If you need a refill on your cardiac medications before your next appointment, please call your pharmacy*  Lab Work: None  If you have labs (blood work) drawn today and your tests are completely normal, you will receive your results only by: Marland Kitchen MyChart Message (if you have MyChart) OR . A paper copy in the mail If you have any lab test that is abnormal or we need to change your treatment, we will call you to review the results.  Testing/Procedures: You had an EKG today.   Your physician has requested that you have an echocardiogram. Echocardiography is a painless test that uses sound waves to create images of your heart. It provides your doctor with information about the size and shape of your heart and how well your heart's chambers and valves are working. This procedure takes approximately one hour. There are no restrictions for this procedure.  Your physician has recommended that you wear a ZIO monitor. ZIO monitors are medical devices that record the heart's electrical activity. Doctors most often use these monitors to diagnose arrhythmias. Arrhythmias are problems with the speed or rhythm of the heartbeat. The monitor is a small, portable device. You can wear one while you do your normal daily activities. This is usually used to diagnose what is causing palpitations/syncope (passing out). Wear for 3 days.   Follow-Up: At [facility], you and your health needs are our priority.  As part of our continuing mission to provide you with exceptional heart care, we have created designated Provider Care Teams.  These Care Teams include your primary Cardiologist (physician)  and Advanced Practice Providers (APPs -  Physician Assistants and Nurse Practitioners) who all work together to provide you with the care you need, when you need it.  Your next appointment:   4 months  The format for your next appointment:   In Person  Provider:   Shirlee More, MD

## 2019-03-05 DIAGNOSIS — N186 End stage renal disease: Secondary | ICD-10-CM | POA: Diagnosis not present

## 2019-03-05 DIAGNOSIS — D631 Anemia in chronic kidney disease: Secondary | ICD-10-CM | POA: Diagnosis not present

## 2019-03-05 DIAGNOSIS — E1129 Type 2 diabetes mellitus with other diabetic kidney complication: Secondary | ICD-10-CM | POA: Diagnosis not present

## 2019-03-05 DIAGNOSIS — D509 Iron deficiency anemia, unspecified: Secondary | ICD-10-CM | POA: Diagnosis not present

## 2019-03-05 DIAGNOSIS — Z992 Dependence on renal dialysis: Secondary | ICD-10-CM | POA: Diagnosis not present

## 2019-03-05 DIAGNOSIS — D689 Coagulation defect, unspecified: Secondary | ICD-10-CM | POA: Diagnosis not present

## 2019-03-05 DIAGNOSIS — N2581 Secondary hyperparathyroidism of renal origin: Secondary | ICD-10-CM | POA: Diagnosis not present

## 2019-03-06 DIAGNOSIS — E113299 Type 2 diabetes mellitus with mild nonproliferative diabetic retinopathy without macular edema, unspecified eye: Secondary | ICD-10-CM | POA: Diagnosis not present

## 2019-03-06 DIAGNOSIS — D509 Iron deficiency anemia, unspecified: Secondary | ICD-10-CM | POA: Diagnosis not present

## 2019-03-06 DIAGNOSIS — G4700 Insomnia, unspecified: Secondary | ICD-10-CM | POA: Diagnosis not present

## 2019-03-06 DIAGNOSIS — E118 Type 2 diabetes mellitus with unspecified complications: Secondary | ICD-10-CM | POA: Diagnosis not present

## 2019-03-06 DIAGNOSIS — K219 Gastro-esophageal reflux disease without esophagitis: Secondary | ICD-10-CM | POA: Diagnosis not present

## 2019-03-06 DIAGNOSIS — E785 Hyperlipidemia, unspecified: Secondary | ICD-10-CM | POA: Diagnosis not present

## 2019-03-06 DIAGNOSIS — I10 Essential (primary) hypertension: Secondary | ICD-10-CM | POA: Diagnosis not present

## 2019-03-06 DIAGNOSIS — E1140 Type 2 diabetes mellitus with diabetic neuropathy, unspecified: Secondary | ICD-10-CM | POA: Diagnosis not present

## 2019-03-07 DIAGNOSIS — N186 End stage renal disease: Secondary | ICD-10-CM | POA: Diagnosis not present

## 2019-03-07 DIAGNOSIS — D509 Iron deficiency anemia, unspecified: Secondary | ICD-10-CM | POA: Diagnosis not present

## 2019-03-07 DIAGNOSIS — Z992 Dependence on renal dialysis: Secondary | ICD-10-CM | POA: Diagnosis not present

## 2019-03-07 DIAGNOSIS — E1129 Type 2 diabetes mellitus with other diabetic kidney complication: Secondary | ICD-10-CM | POA: Diagnosis not present

## 2019-03-07 DIAGNOSIS — N2581 Secondary hyperparathyroidism of renal origin: Secondary | ICD-10-CM | POA: Diagnosis not present

## 2019-03-07 DIAGNOSIS — D631 Anemia in chronic kidney disease: Secondary | ICD-10-CM | POA: Diagnosis not present

## 2019-03-07 DIAGNOSIS — D689 Coagulation defect, unspecified: Secondary | ICD-10-CM | POA: Diagnosis not present

## 2019-03-10 DIAGNOSIS — N186 End stage renal disease: Secondary | ICD-10-CM | POA: Diagnosis not present

## 2019-03-10 DIAGNOSIS — N2581 Secondary hyperparathyroidism of renal origin: Secondary | ICD-10-CM | POA: Diagnosis not present

## 2019-03-10 DIAGNOSIS — E1129 Type 2 diabetes mellitus with other diabetic kidney complication: Secondary | ICD-10-CM | POA: Diagnosis not present

## 2019-03-10 DIAGNOSIS — Z992 Dependence on renal dialysis: Secondary | ICD-10-CM | POA: Diagnosis not present

## 2019-03-10 DIAGNOSIS — D509 Iron deficiency anemia, unspecified: Secondary | ICD-10-CM | POA: Diagnosis not present

## 2019-03-10 DIAGNOSIS — D689 Coagulation defect, unspecified: Secondary | ICD-10-CM | POA: Diagnosis not present

## 2019-03-10 DIAGNOSIS — D631 Anemia in chronic kidney disease: Secondary | ICD-10-CM | POA: Diagnosis not present

## 2019-03-12 DIAGNOSIS — D509 Iron deficiency anemia, unspecified: Secondary | ICD-10-CM | POA: Diagnosis not present

## 2019-03-12 DIAGNOSIS — N186 End stage renal disease: Secondary | ICD-10-CM | POA: Diagnosis not present

## 2019-03-12 DIAGNOSIS — N2581 Secondary hyperparathyroidism of renal origin: Secondary | ICD-10-CM | POA: Diagnosis not present

## 2019-03-12 DIAGNOSIS — Z992 Dependence on renal dialysis: Secondary | ICD-10-CM | POA: Diagnosis not present

## 2019-03-12 DIAGNOSIS — E1129 Type 2 diabetes mellitus with other diabetic kidney complication: Secondary | ICD-10-CM | POA: Diagnosis not present

## 2019-03-12 DIAGNOSIS — D689 Coagulation defect, unspecified: Secondary | ICD-10-CM | POA: Diagnosis not present

## 2019-03-12 DIAGNOSIS — D631 Anemia in chronic kidney disease: Secondary | ICD-10-CM | POA: Diagnosis not present

## 2019-03-14 DIAGNOSIS — E1129 Type 2 diabetes mellitus with other diabetic kidney complication: Secondary | ICD-10-CM | POA: Diagnosis not present

## 2019-03-14 DIAGNOSIS — D509 Iron deficiency anemia, unspecified: Secondary | ICD-10-CM | POA: Diagnosis not present

## 2019-03-14 DIAGNOSIS — Z992 Dependence on renal dialysis: Secondary | ICD-10-CM | POA: Diagnosis not present

## 2019-03-14 DIAGNOSIS — N186 End stage renal disease: Secondary | ICD-10-CM | POA: Diagnosis not present

## 2019-03-14 DIAGNOSIS — D631 Anemia in chronic kidney disease: Secondary | ICD-10-CM | POA: Diagnosis not present

## 2019-03-14 DIAGNOSIS — D689 Coagulation defect, unspecified: Secondary | ICD-10-CM | POA: Diagnosis not present

## 2019-03-14 DIAGNOSIS — N2581 Secondary hyperparathyroidism of renal origin: Secondary | ICD-10-CM | POA: Diagnosis not present

## 2019-03-17 DIAGNOSIS — D689 Coagulation defect, unspecified: Secondary | ICD-10-CM | POA: Diagnosis not present

## 2019-03-17 DIAGNOSIS — D631 Anemia in chronic kidney disease: Secondary | ICD-10-CM | POA: Diagnosis not present

## 2019-03-17 DIAGNOSIS — D509 Iron deficiency anemia, unspecified: Secondary | ICD-10-CM | POA: Diagnosis not present

## 2019-03-17 DIAGNOSIS — N186 End stage renal disease: Secondary | ICD-10-CM | POA: Diagnosis not present

## 2019-03-17 DIAGNOSIS — E1129 Type 2 diabetes mellitus with other diabetic kidney complication: Secondary | ICD-10-CM | POA: Diagnosis not present

## 2019-03-17 DIAGNOSIS — Z992 Dependence on renal dialysis: Secondary | ICD-10-CM | POA: Diagnosis not present

## 2019-03-17 DIAGNOSIS — N2581 Secondary hyperparathyroidism of renal origin: Secondary | ICD-10-CM | POA: Diagnosis not present

## 2019-03-19 ENCOUNTER — Ambulatory Visit (INDEPENDENT_AMBULATORY_CARE_PROVIDER_SITE_OTHER): Payer: Medicare Other

## 2019-03-19 DIAGNOSIS — D689 Coagulation defect, unspecified: Secondary | ICD-10-CM | POA: Diagnosis not present

## 2019-03-19 DIAGNOSIS — D631 Anemia in chronic kidney disease: Secondary | ICD-10-CM | POA: Diagnosis not present

## 2019-03-19 DIAGNOSIS — Z992 Dependence on renal dialysis: Secondary | ICD-10-CM | POA: Diagnosis not present

## 2019-03-19 DIAGNOSIS — N186 End stage renal disease: Secondary | ICD-10-CM | POA: Diagnosis not present

## 2019-03-19 DIAGNOSIS — I480 Paroxysmal atrial fibrillation: Secondary | ICD-10-CM

## 2019-03-19 DIAGNOSIS — D509 Iron deficiency anemia, unspecified: Secondary | ICD-10-CM | POA: Diagnosis not present

## 2019-03-19 DIAGNOSIS — R001 Bradycardia, unspecified: Secondary | ICD-10-CM

## 2019-03-19 DIAGNOSIS — N2581 Secondary hyperparathyroidism of renal origin: Secondary | ICD-10-CM | POA: Diagnosis not present

## 2019-03-19 DIAGNOSIS — E1129 Type 2 diabetes mellitus with other diabetic kidney complication: Secondary | ICD-10-CM | POA: Diagnosis not present

## 2019-03-21 DIAGNOSIS — Z992 Dependence on renal dialysis: Secondary | ICD-10-CM | POA: Diagnosis not present

## 2019-03-21 DIAGNOSIS — D509 Iron deficiency anemia, unspecified: Secondary | ICD-10-CM | POA: Diagnosis not present

## 2019-03-21 DIAGNOSIS — D631 Anemia in chronic kidney disease: Secondary | ICD-10-CM | POA: Diagnosis not present

## 2019-03-21 DIAGNOSIS — N2581 Secondary hyperparathyroidism of renal origin: Secondary | ICD-10-CM | POA: Diagnosis not present

## 2019-03-21 DIAGNOSIS — N186 End stage renal disease: Secondary | ICD-10-CM | POA: Diagnosis not present

## 2019-03-21 DIAGNOSIS — E1129 Type 2 diabetes mellitus with other diabetic kidney complication: Secondary | ICD-10-CM | POA: Diagnosis not present

## 2019-03-21 DIAGNOSIS — D689 Coagulation defect, unspecified: Secondary | ICD-10-CM | POA: Diagnosis not present

## 2019-03-24 DIAGNOSIS — E1129 Type 2 diabetes mellitus with other diabetic kidney complication: Secondary | ICD-10-CM | POA: Diagnosis not present

## 2019-03-24 DIAGNOSIS — E1122 Type 2 diabetes mellitus with diabetic chronic kidney disease: Secondary | ICD-10-CM | POA: Diagnosis not present

## 2019-03-24 DIAGNOSIS — D631 Anemia in chronic kidney disease: Secondary | ICD-10-CM | POA: Diagnosis not present

## 2019-03-24 DIAGNOSIS — Z992 Dependence on renal dialysis: Secondary | ICD-10-CM | POA: Diagnosis not present

## 2019-03-24 DIAGNOSIS — D689 Coagulation defect, unspecified: Secondary | ICD-10-CM | POA: Diagnosis not present

## 2019-03-24 DIAGNOSIS — N2581 Secondary hyperparathyroidism of renal origin: Secondary | ICD-10-CM | POA: Diagnosis not present

## 2019-03-24 DIAGNOSIS — D509 Iron deficiency anemia, unspecified: Secondary | ICD-10-CM | POA: Diagnosis not present

## 2019-03-24 DIAGNOSIS — N186 End stage renal disease: Secondary | ICD-10-CM | POA: Diagnosis not present

## 2019-03-26 DIAGNOSIS — N186 End stage renal disease: Secondary | ICD-10-CM | POA: Diagnosis not present

## 2019-03-26 DIAGNOSIS — D689 Coagulation defect, unspecified: Secondary | ICD-10-CM | POA: Diagnosis not present

## 2019-03-26 DIAGNOSIS — D509 Iron deficiency anemia, unspecified: Secondary | ICD-10-CM | POA: Diagnosis not present

## 2019-03-26 DIAGNOSIS — Z992 Dependence on renal dialysis: Secondary | ICD-10-CM | POA: Diagnosis not present

## 2019-03-26 DIAGNOSIS — D631 Anemia in chronic kidney disease: Secondary | ICD-10-CM | POA: Diagnosis not present

## 2019-03-26 DIAGNOSIS — N2581 Secondary hyperparathyroidism of renal origin: Secondary | ICD-10-CM | POA: Diagnosis not present

## 2019-03-26 DIAGNOSIS — E1129 Type 2 diabetes mellitus with other diabetic kidney complication: Secondary | ICD-10-CM | POA: Diagnosis not present

## 2019-03-27 DIAGNOSIS — R001 Bradycardia, unspecified: Secondary | ICD-10-CM | POA: Diagnosis not present

## 2019-03-28 DIAGNOSIS — D631 Anemia in chronic kidney disease: Secondary | ICD-10-CM | POA: Diagnosis not present

## 2019-03-28 DIAGNOSIS — D689 Coagulation defect, unspecified: Secondary | ICD-10-CM | POA: Diagnosis not present

## 2019-03-28 DIAGNOSIS — N186 End stage renal disease: Secondary | ICD-10-CM | POA: Diagnosis not present

## 2019-03-28 DIAGNOSIS — E1129 Type 2 diabetes mellitus with other diabetic kidney complication: Secondary | ICD-10-CM | POA: Diagnosis not present

## 2019-03-28 DIAGNOSIS — D509 Iron deficiency anemia, unspecified: Secondary | ICD-10-CM | POA: Diagnosis not present

## 2019-03-28 DIAGNOSIS — N2581 Secondary hyperparathyroidism of renal origin: Secondary | ICD-10-CM | POA: Diagnosis not present

## 2019-03-28 DIAGNOSIS — Z992 Dependence on renal dialysis: Secondary | ICD-10-CM | POA: Diagnosis not present

## 2019-03-31 DIAGNOSIS — N2581 Secondary hyperparathyroidism of renal origin: Secondary | ICD-10-CM | POA: Diagnosis not present

## 2019-03-31 DIAGNOSIS — E1129 Type 2 diabetes mellitus with other diabetic kidney complication: Secondary | ICD-10-CM | POA: Diagnosis not present

## 2019-03-31 DIAGNOSIS — Z992 Dependence on renal dialysis: Secondary | ICD-10-CM | POA: Diagnosis not present

## 2019-03-31 DIAGNOSIS — N186 End stage renal disease: Secondary | ICD-10-CM | POA: Diagnosis not present

## 2019-03-31 DIAGNOSIS — D631 Anemia in chronic kidney disease: Secondary | ICD-10-CM | POA: Diagnosis not present

## 2019-03-31 DIAGNOSIS — D509 Iron deficiency anemia, unspecified: Secondary | ICD-10-CM | POA: Diagnosis not present

## 2019-03-31 DIAGNOSIS — D689 Coagulation defect, unspecified: Secondary | ICD-10-CM | POA: Diagnosis not present

## 2019-04-02 DIAGNOSIS — D509 Iron deficiency anemia, unspecified: Secondary | ICD-10-CM | POA: Diagnosis not present

## 2019-04-02 DIAGNOSIS — N186 End stage renal disease: Secondary | ICD-10-CM | POA: Diagnosis not present

## 2019-04-02 DIAGNOSIS — D689 Coagulation defect, unspecified: Secondary | ICD-10-CM | POA: Diagnosis not present

## 2019-04-02 DIAGNOSIS — D631 Anemia in chronic kidney disease: Secondary | ICD-10-CM | POA: Diagnosis not present

## 2019-04-02 DIAGNOSIS — Z992 Dependence on renal dialysis: Secondary | ICD-10-CM | POA: Diagnosis not present

## 2019-04-02 DIAGNOSIS — E1129 Type 2 diabetes mellitus with other diabetic kidney complication: Secondary | ICD-10-CM | POA: Diagnosis not present

## 2019-04-02 DIAGNOSIS — N2581 Secondary hyperparathyroidism of renal origin: Secondary | ICD-10-CM | POA: Diagnosis not present

## 2019-04-03 DIAGNOSIS — D509 Iron deficiency anemia, unspecified: Secondary | ICD-10-CM | POA: Diagnosis not present

## 2019-04-03 DIAGNOSIS — G4700 Insomnia, unspecified: Secondary | ICD-10-CM | POA: Diagnosis not present

## 2019-04-03 DIAGNOSIS — E113299 Type 2 diabetes mellitus with mild nonproliferative diabetic retinopathy without macular edema, unspecified eye: Secondary | ICD-10-CM | POA: Diagnosis not present

## 2019-04-03 DIAGNOSIS — K219 Gastro-esophageal reflux disease without esophagitis: Secondary | ICD-10-CM | POA: Diagnosis not present

## 2019-04-03 DIAGNOSIS — E1140 Type 2 diabetes mellitus with diabetic neuropathy, unspecified: Secondary | ICD-10-CM | POA: Diagnosis not present

## 2019-04-04 DIAGNOSIS — D689 Coagulation defect, unspecified: Secondary | ICD-10-CM | POA: Diagnosis not present

## 2019-04-04 DIAGNOSIS — N186 End stage renal disease: Secondary | ICD-10-CM | POA: Diagnosis not present

## 2019-04-04 DIAGNOSIS — D509 Iron deficiency anemia, unspecified: Secondary | ICD-10-CM | POA: Diagnosis not present

## 2019-04-04 DIAGNOSIS — Z992 Dependence on renal dialysis: Secondary | ICD-10-CM | POA: Diagnosis not present

## 2019-04-04 DIAGNOSIS — D631 Anemia in chronic kidney disease: Secondary | ICD-10-CM | POA: Diagnosis not present

## 2019-04-04 DIAGNOSIS — N2581 Secondary hyperparathyroidism of renal origin: Secondary | ICD-10-CM | POA: Diagnosis not present

## 2019-04-04 DIAGNOSIS — E1129 Type 2 diabetes mellitus with other diabetic kidney complication: Secondary | ICD-10-CM | POA: Diagnosis not present

## 2019-04-07 DIAGNOSIS — E1129 Type 2 diabetes mellitus with other diabetic kidney complication: Secondary | ICD-10-CM | POA: Diagnosis not present

## 2019-04-07 DIAGNOSIS — N2581 Secondary hyperparathyroidism of renal origin: Secondary | ICD-10-CM | POA: Diagnosis not present

## 2019-04-07 DIAGNOSIS — D509 Iron deficiency anemia, unspecified: Secondary | ICD-10-CM | POA: Diagnosis not present

## 2019-04-07 DIAGNOSIS — D689 Coagulation defect, unspecified: Secondary | ICD-10-CM | POA: Diagnosis not present

## 2019-04-07 DIAGNOSIS — N186 End stage renal disease: Secondary | ICD-10-CM | POA: Diagnosis not present

## 2019-04-07 DIAGNOSIS — Z992 Dependence on renal dialysis: Secondary | ICD-10-CM | POA: Diagnosis not present

## 2019-04-07 DIAGNOSIS — D631 Anemia in chronic kidney disease: Secondary | ICD-10-CM | POA: Diagnosis not present

## 2019-04-09 ENCOUNTER — Telehealth: Payer: Self-pay | Admitting: Cardiology

## 2019-04-09 DIAGNOSIS — N2581 Secondary hyperparathyroidism of renal origin: Secondary | ICD-10-CM | POA: Diagnosis not present

## 2019-04-09 DIAGNOSIS — D509 Iron deficiency anemia, unspecified: Secondary | ICD-10-CM | POA: Diagnosis not present

## 2019-04-09 DIAGNOSIS — D631 Anemia in chronic kidney disease: Secondary | ICD-10-CM | POA: Diagnosis not present

## 2019-04-09 DIAGNOSIS — E1129 Type 2 diabetes mellitus with other diabetic kidney complication: Secondary | ICD-10-CM | POA: Diagnosis not present

## 2019-04-09 DIAGNOSIS — D689 Coagulation defect, unspecified: Secondary | ICD-10-CM | POA: Diagnosis not present

## 2019-04-09 DIAGNOSIS — N186 End stage renal disease: Secondary | ICD-10-CM | POA: Diagnosis not present

## 2019-04-09 DIAGNOSIS — Z992 Dependence on renal dialysis: Secondary | ICD-10-CM | POA: Diagnosis not present

## 2019-04-09 NOTE — Telephone Encounter (Signed)
Started afibbing in dialysis

## 2019-04-10 NOTE — Telephone Encounter (Signed)
Patient states while he was getting his dialysis treatment yesterday he went into atrial fibrillation and his "heart rate was really high." He experienced palpitations and shortness of breath during this episode. The dialysis team recommended he reach out to our office to make Dr. Bettina Gavia aware of it and the fact that he "has more fluid on his stomach than they can drain out." Patient reports weakness and fatigue today but otherwise he denies any symptoms. His BP was 156/67 and HR 53 this morning. He is scheduled for an echocardiogram on 04/23/2019. Please advise of further recommendations. Thanks!

## 2019-04-10 NOTE — Telephone Encounter (Signed)
Needs to be seen in office [facility] or [facility] with EKG re AF

## 2019-04-11 DIAGNOSIS — D631 Anemia in chronic kidney disease: Secondary | ICD-10-CM | POA: Diagnosis not present

## 2019-04-11 DIAGNOSIS — D689 Coagulation defect, unspecified: Secondary | ICD-10-CM | POA: Diagnosis not present

## 2019-04-11 DIAGNOSIS — D509 Iron deficiency anemia, unspecified: Secondary | ICD-10-CM | POA: Diagnosis not present

## 2019-04-11 DIAGNOSIS — N186 End stage renal disease: Secondary | ICD-10-CM | POA: Diagnosis not present

## 2019-04-11 DIAGNOSIS — N2581 Secondary hyperparathyroidism of renal origin: Secondary | ICD-10-CM | POA: Diagnosis not present

## 2019-04-11 DIAGNOSIS — E1129 Type 2 diabetes mellitus with other diabetic kidney complication: Secondary | ICD-10-CM | POA: Diagnosis not present

## 2019-04-11 DIAGNOSIS — Z992 Dependence on renal dialysis: Secondary | ICD-10-CM | POA: Diagnosis not present

## 2019-04-11 NOTE — Telephone Encounter (Signed)
Spoke with patient who reports he "is feeling better and better every day." Denies any symptoms at this time. Clarified plan with Dr. Bettina Gavia who advised patient to keep his echocardiogram appointment as scheduled on 04/23/2019 and the EKG reading will be reviewed that is attached to the echocardiogram images by Dr. Bettina Gavia. However, patient advised to contact our office if symptoms return as we will schedule him to come into the office for an EKG sooner. Patient is agreeable to plan and verbalized understanding. No further questions.

## 2019-04-14 ENCOUNTER — Encounter: Payer: Self-pay | Admitting: Gastroenterology

## 2019-04-14 DIAGNOSIS — N186 End stage renal disease: Secondary | ICD-10-CM | POA: Diagnosis not present

## 2019-04-14 DIAGNOSIS — N2581 Secondary hyperparathyroidism of renal origin: Secondary | ICD-10-CM | POA: Diagnosis not present

## 2019-04-14 DIAGNOSIS — D631 Anemia in chronic kidney disease: Secondary | ICD-10-CM | POA: Diagnosis not present

## 2019-04-14 DIAGNOSIS — D509 Iron deficiency anemia, unspecified: Secondary | ICD-10-CM | POA: Diagnosis not present

## 2019-04-14 DIAGNOSIS — Z992 Dependence on renal dialysis: Secondary | ICD-10-CM | POA: Diagnosis not present

## 2019-04-14 DIAGNOSIS — D689 Coagulation defect, unspecified: Secondary | ICD-10-CM | POA: Diagnosis not present

## 2019-04-14 DIAGNOSIS — E1129 Type 2 diabetes mellitus with other diabetic kidney complication: Secondary | ICD-10-CM | POA: Diagnosis not present

## 2019-04-15 DIAGNOSIS — R1030 Lower abdominal pain, unspecified: Secondary | ICD-10-CM | POA: Diagnosis not present

## 2019-04-15 DIAGNOSIS — K219 Gastro-esophageal reflux disease without esophagitis: Secondary | ICD-10-CM | POA: Diagnosis not present

## 2019-04-15 DIAGNOSIS — R809 Proteinuria, unspecified: Secondary | ICD-10-CM | POA: Diagnosis not present

## 2019-04-16 DIAGNOSIS — N2581 Secondary hyperparathyroidism of renal origin: Secondary | ICD-10-CM | POA: Diagnosis not present

## 2019-04-16 DIAGNOSIS — D509 Iron deficiency anemia, unspecified: Secondary | ICD-10-CM | POA: Diagnosis not present

## 2019-04-16 DIAGNOSIS — D689 Coagulation defect, unspecified: Secondary | ICD-10-CM | POA: Diagnosis not present

## 2019-04-16 DIAGNOSIS — D631 Anemia in chronic kidney disease: Secondary | ICD-10-CM | POA: Diagnosis not present

## 2019-04-16 DIAGNOSIS — E1129 Type 2 diabetes mellitus with other diabetic kidney complication: Secondary | ICD-10-CM | POA: Diagnosis not present

## 2019-04-16 DIAGNOSIS — Z992 Dependence on renal dialysis: Secondary | ICD-10-CM | POA: Diagnosis not present

## 2019-04-16 DIAGNOSIS — N186 End stage renal disease: Secondary | ICD-10-CM | POA: Diagnosis not present

## 2019-04-19 DIAGNOSIS — D631 Anemia in chronic kidney disease: Secondary | ICD-10-CM | POA: Diagnosis not present

## 2019-04-19 DIAGNOSIS — Z992 Dependence on renal dialysis: Secondary | ICD-10-CM | POA: Diagnosis not present

## 2019-04-19 DIAGNOSIS — N2581 Secondary hyperparathyroidism of renal origin: Secondary | ICD-10-CM | POA: Diagnosis not present

## 2019-04-19 DIAGNOSIS — D509 Iron deficiency anemia, unspecified: Secondary | ICD-10-CM | POA: Diagnosis not present

## 2019-04-19 DIAGNOSIS — N186 End stage renal disease: Secondary | ICD-10-CM | POA: Diagnosis not present

## 2019-04-19 DIAGNOSIS — E1129 Type 2 diabetes mellitus with other diabetic kidney complication: Secondary | ICD-10-CM | POA: Diagnosis not present

## 2019-04-19 DIAGNOSIS — D689 Coagulation defect, unspecified: Secondary | ICD-10-CM | POA: Diagnosis not present

## 2019-04-21 DIAGNOSIS — D631 Anemia in chronic kidney disease: Secondary | ICD-10-CM | POA: Diagnosis not present

## 2019-04-21 DIAGNOSIS — R1030 Lower abdominal pain, unspecified: Secondary | ICD-10-CM | POA: Diagnosis not present

## 2019-04-21 DIAGNOSIS — N186 End stage renal disease: Secondary | ICD-10-CM | POA: Diagnosis not present

## 2019-04-21 DIAGNOSIS — N2581 Secondary hyperparathyroidism of renal origin: Secondary | ICD-10-CM | POA: Diagnosis not present

## 2019-04-21 DIAGNOSIS — E1140 Type 2 diabetes mellitus with diabetic neuropathy, unspecified: Secondary | ICD-10-CM | POA: Diagnosis not present

## 2019-04-21 DIAGNOSIS — D509 Iron deficiency anemia, unspecified: Secondary | ICD-10-CM | POA: Diagnosis not present

## 2019-04-21 DIAGNOSIS — K219 Gastro-esophageal reflux disease without esophagitis: Secondary | ICD-10-CM | POA: Diagnosis not present

## 2019-04-21 DIAGNOSIS — E1129 Type 2 diabetes mellitus with other diabetic kidney complication: Secondary | ICD-10-CM | POA: Diagnosis not present

## 2019-04-21 DIAGNOSIS — Z992 Dependence on renal dialysis: Secondary | ICD-10-CM | POA: Diagnosis not present

## 2019-04-21 DIAGNOSIS — D689 Coagulation defect, unspecified: Secondary | ICD-10-CM | POA: Diagnosis not present

## 2019-04-22 DIAGNOSIS — R519 Headache, unspecified: Secondary | ICD-10-CM | POA: Insufficient documentation

## 2019-04-22 HISTORY — DX: Headache, unspecified: R51.9

## 2019-04-23 ENCOUNTER — Other Ambulatory Visit: Payer: Self-pay

## 2019-04-23 ENCOUNTER — Ambulatory Visit (INDEPENDENT_AMBULATORY_CARE_PROVIDER_SITE_OTHER): Payer: Medicare Other

## 2019-04-23 DIAGNOSIS — I480 Paroxysmal atrial fibrillation: Secondary | ICD-10-CM

## 2019-04-23 DIAGNOSIS — D509 Iron deficiency anemia, unspecified: Secondary | ICD-10-CM | POA: Diagnosis not present

## 2019-04-23 DIAGNOSIS — D689 Coagulation defect, unspecified: Secondary | ICD-10-CM | POA: Diagnosis not present

## 2019-04-23 DIAGNOSIS — I110 Hypertensive heart disease with heart failure: Secondary | ICD-10-CM | POA: Diagnosis not present

## 2019-04-23 DIAGNOSIS — I5042 Chronic combined systolic (congestive) and diastolic (congestive) heart failure: Secondary | ICD-10-CM

## 2019-04-23 DIAGNOSIS — E1129 Type 2 diabetes mellitus with other diabetic kidney complication: Secondary | ICD-10-CM | POA: Diagnosis not present

## 2019-04-23 DIAGNOSIS — N186 End stage renal disease: Secondary | ICD-10-CM | POA: Diagnosis not present

## 2019-04-23 DIAGNOSIS — D631 Anemia in chronic kidney disease: Secondary | ICD-10-CM | POA: Diagnosis not present

## 2019-04-23 DIAGNOSIS — Z992 Dependence on renal dialysis: Secondary | ICD-10-CM | POA: Diagnosis not present

## 2019-04-23 DIAGNOSIS — R001 Bradycardia, unspecified: Secondary | ICD-10-CM

## 2019-04-23 DIAGNOSIS — N2581 Secondary hyperparathyroidism of renal origin: Secondary | ICD-10-CM | POA: Diagnosis not present

## 2019-04-23 NOTE — Progress Notes (Signed)
Complete echocardiogram has been performed.  Jimmy Jazir Newey RDCS, RVT 

## 2019-04-24 DIAGNOSIS — E1122 Type 2 diabetes mellitus with diabetic chronic kidney disease: Secondary | ICD-10-CM | POA: Diagnosis not present

## 2019-04-24 DIAGNOSIS — Z992 Dependence on renal dialysis: Secondary | ICD-10-CM | POA: Diagnosis not present

## 2019-04-24 DIAGNOSIS — N186 End stage renal disease: Secondary | ICD-10-CM | POA: Diagnosis not present

## 2019-04-26 DIAGNOSIS — D631 Anemia in chronic kidney disease: Secondary | ICD-10-CM | POA: Diagnosis not present

## 2019-04-26 DIAGNOSIS — E1129 Type 2 diabetes mellitus with other diabetic kidney complication: Secondary | ICD-10-CM | POA: Diagnosis not present

## 2019-04-26 DIAGNOSIS — D509 Iron deficiency anemia, unspecified: Secondary | ICD-10-CM | POA: Diagnosis not present

## 2019-04-26 DIAGNOSIS — Z992 Dependence on renal dialysis: Secondary | ICD-10-CM | POA: Diagnosis not present

## 2019-04-26 DIAGNOSIS — N186 End stage renal disease: Secondary | ICD-10-CM | POA: Diagnosis not present

## 2019-04-26 DIAGNOSIS — D689 Coagulation defect, unspecified: Secondary | ICD-10-CM | POA: Diagnosis not present

## 2019-04-26 DIAGNOSIS — N2581 Secondary hyperparathyroidism of renal origin: Secondary | ICD-10-CM | POA: Diagnosis not present

## 2019-04-28 DIAGNOSIS — D689 Coagulation defect, unspecified: Secondary | ICD-10-CM | POA: Diagnosis not present

## 2019-04-28 DIAGNOSIS — Z992 Dependence on renal dialysis: Secondary | ICD-10-CM | POA: Diagnosis not present

## 2019-04-28 DIAGNOSIS — E1129 Type 2 diabetes mellitus with other diabetic kidney complication: Secondary | ICD-10-CM | POA: Diagnosis not present

## 2019-04-28 DIAGNOSIS — N2581 Secondary hyperparathyroidism of renal origin: Secondary | ICD-10-CM | POA: Diagnosis not present

## 2019-04-28 DIAGNOSIS — D509 Iron deficiency anemia, unspecified: Secondary | ICD-10-CM | POA: Diagnosis not present

## 2019-04-28 DIAGNOSIS — N186 End stage renal disease: Secondary | ICD-10-CM | POA: Diagnosis not present

## 2019-04-28 DIAGNOSIS — D631 Anemia in chronic kidney disease: Secondary | ICD-10-CM | POA: Diagnosis not present

## 2019-04-29 DIAGNOSIS — R1030 Lower abdominal pain, unspecified: Secondary | ICD-10-CM | POA: Diagnosis not present

## 2019-04-29 DIAGNOSIS — I714 Abdominal aortic aneurysm, without rupture: Secondary | ICD-10-CM | POA: Diagnosis not present

## 2019-04-29 DIAGNOSIS — R109 Unspecified abdominal pain: Secondary | ICD-10-CM | POA: Diagnosis not present

## 2019-04-30 DIAGNOSIS — N186 End stage renal disease: Secondary | ICD-10-CM | POA: Diagnosis not present

## 2019-04-30 DIAGNOSIS — E1129 Type 2 diabetes mellitus with other diabetic kidney complication: Secondary | ICD-10-CM | POA: Diagnosis not present

## 2019-04-30 DIAGNOSIS — D689 Coagulation defect, unspecified: Secondary | ICD-10-CM | POA: Diagnosis not present

## 2019-04-30 DIAGNOSIS — D631 Anemia in chronic kidney disease: Secondary | ICD-10-CM | POA: Diagnosis not present

## 2019-04-30 DIAGNOSIS — Z992 Dependence on renal dialysis: Secondary | ICD-10-CM | POA: Diagnosis not present

## 2019-04-30 DIAGNOSIS — D509 Iron deficiency anemia, unspecified: Secondary | ICD-10-CM | POA: Diagnosis not present

## 2019-04-30 DIAGNOSIS — N2581 Secondary hyperparathyroidism of renal origin: Secondary | ICD-10-CM | POA: Diagnosis not present

## 2019-05-01 DIAGNOSIS — K219 Gastro-esophageal reflux disease without esophagitis: Secondary | ICD-10-CM | POA: Diagnosis not present

## 2019-05-01 DIAGNOSIS — R1030 Lower abdominal pain, unspecified: Secondary | ICD-10-CM | POA: Diagnosis not present

## 2019-05-01 DIAGNOSIS — E1140 Type 2 diabetes mellitus with diabetic neuropathy, unspecified: Secondary | ICD-10-CM | POA: Diagnosis not present

## 2019-05-02 ENCOUNTER — Inpatient Hospital Stay (HOSPITAL_COMMUNITY)
Admission: EM | Admit: 2019-05-02 | Discharge: 2019-05-05 | DRG: 280 | Disposition: A | Discharge status: Home / Self Care | Payer: Medicare Other | Attending: Internal Medicine | Admitting: Internal Medicine

## 2019-05-02 ENCOUNTER — Other Ambulatory Visit: Payer: Self-pay

## 2019-05-02 ENCOUNTER — Encounter (HOSPITAL_COMMUNITY)
Admission: EM | Disposition: A | Discharge status: Home / Self Care | Payer: Self-pay | Source: Home / Self Care | Attending: Internal Medicine

## 2019-05-02 ENCOUNTER — Emergency Department (HOSPITAL_COMMUNITY): Payer: Medicare Other

## 2019-05-02 ENCOUNTER — Encounter (HOSPITAL_COMMUNITY): Payer: Self-pay

## 2019-05-02 DIAGNOSIS — I4891 Unspecified atrial fibrillation: Secondary | ICD-10-CM | POA: Diagnosis not present

## 2019-05-02 DIAGNOSIS — M545 Low back pain: Secondary | ICD-10-CM | POA: Diagnosis not present

## 2019-05-02 DIAGNOSIS — E785 Hyperlipidemia, unspecified: Secondary | ICD-10-CM | POA: Diagnosis not present

## 2019-05-02 DIAGNOSIS — I200 Unstable angina: Secondary | ICD-10-CM

## 2019-05-02 DIAGNOSIS — I480 Paroxysmal atrial fibrillation: Secondary | ICD-10-CM | POA: Diagnosis present

## 2019-05-02 DIAGNOSIS — I5042 Chronic combined systolic (congestive) and diastolic (congestive) heart failure: Secondary | ICD-10-CM | POA: Diagnosis not present

## 2019-05-02 DIAGNOSIS — J9601 Acute respiratory failure with hypoxia: Secondary | ICD-10-CM | POA: Diagnosis not present

## 2019-05-02 DIAGNOSIS — Z955 Presence of coronary angioplasty implant and graft: Secondary | ICD-10-CM | POA: Diagnosis not present

## 2019-05-02 DIAGNOSIS — Z7902 Long term (current) use of antithrombotics/antiplatelets: Secondary | ICD-10-CM | POA: Diagnosis not present

## 2019-05-02 DIAGNOSIS — I5033 Acute on chronic diastolic (congestive) heart failure: Secondary | ICD-10-CM | POA: Diagnosis not present

## 2019-05-02 DIAGNOSIS — U071 COVID-19: Secondary | ICD-10-CM | POA: Diagnosis present

## 2019-05-02 DIAGNOSIS — D631 Anemia in chronic kidney disease: Secondary | ICD-10-CM | POA: Diagnosis present

## 2019-05-02 DIAGNOSIS — E871 Hypo-osmolality and hyponatremia: Secondary | ICD-10-CM | POA: Diagnosis not present

## 2019-05-02 DIAGNOSIS — R0789 Other chest pain: Secondary | ICD-10-CM | POA: Diagnosis not present

## 2019-05-02 DIAGNOSIS — I252 Old myocardial infarction: Secondary | ICD-10-CM | POA: Diagnosis not present

## 2019-05-02 DIAGNOSIS — E876 Hypokalemia: Secondary | ICD-10-CM | POA: Diagnosis present

## 2019-05-02 DIAGNOSIS — N186 End stage renal disease: Secondary | ICD-10-CM | POA: Diagnosis not present

## 2019-05-02 DIAGNOSIS — I272 Pulmonary hypertension, unspecified: Secondary | ICD-10-CM | POA: Diagnosis present

## 2019-05-02 DIAGNOSIS — I429 Cardiomyopathy, unspecified: Secondary | ICD-10-CM | POA: Diagnosis present

## 2019-05-02 DIAGNOSIS — Z992 Dependence on renal dialysis: Secondary | ICD-10-CM

## 2019-05-02 DIAGNOSIS — H409 Unspecified glaucoma: Secondary | ICD-10-CM | POA: Diagnosis not present

## 2019-05-02 DIAGNOSIS — Z87891 Personal history of nicotine dependence: Secondary | ICD-10-CM

## 2019-05-02 DIAGNOSIS — I25110 Atherosclerotic heart disease of native coronary artery with unstable angina pectoris: Secondary | ICD-10-CM | POA: Diagnosis not present

## 2019-05-02 DIAGNOSIS — E1151 Type 2 diabetes mellitus with diabetic peripheral angiopathy without gangrene: Secondary | ICD-10-CM | POA: Diagnosis not present

## 2019-05-02 DIAGNOSIS — I472 Ventricular tachycardia, unspecified: Secondary | ICD-10-CM

## 2019-05-02 DIAGNOSIS — D689 Coagulation defect, unspecified: Secondary | ICD-10-CM | POA: Diagnosis not present

## 2019-05-02 DIAGNOSIS — E1122 Type 2 diabetes mellitus with diabetic chronic kidney disease: Secondary | ICD-10-CM | POA: Diagnosis not present

## 2019-05-02 DIAGNOSIS — Z8249 Family history of ischemic heart disease and other diseases of the circulatory system: Secondary | ICD-10-CM

## 2019-05-02 DIAGNOSIS — Z9841 Cataract extraction status, right eye: Secondary | ICD-10-CM

## 2019-05-02 DIAGNOSIS — K219 Gastro-esophageal reflux disease without esophagitis: Secondary | ICD-10-CM | POA: Diagnosis present

## 2019-05-02 DIAGNOSIS — Z8673 Personal history of transient ischemic attack (TIA), and cerebral infarction without residual deficits: Secondary | ICD-10-CM

## 2019-05-02 DIAGNOSIS — R000 Tachycardia, unspecified: Secondary | ICD-10-CM

## 2019-05-02 DIAGNOSIS — N2581 Secondary hyperparathyroidism of renal origin: Secondary | ICD-10-CM | POA: Diagnosis not present

## 2019-05-02 DIAGNOSIS — I214 Non-ST elevation (NSTEMI) myocardial infarction: Secondary | ICD-10-CM | POA: Diagnosis not present

## 2019-05-02 DIAGNOSIS — Z79899 Other long term (current) drug therapy: Secondary | ICD-10-CM

## 2019-05-02 DIAGNOSIS — I340 Nonrheumatic mitral (valve) insufficiency: Secondary | ICD-10-CM | POA: Diagnosis present

## 2019-05-02 DIAGNOSIS — I249 Acute ischemic heart disease, unspecified: Secondary | ICD-10-CM

## 2019-05-02 DIAGNOSIS — Z961 Presence of intraocular lens: Secondary | ICD-10-CM | POA: Diagnosis present

## 2019-05-02 DIAGNOSIS — F329 Major depressive disorder, single episode, unspecified: Secondary | ICD-10-CM | POA: Diagnosis present

## 2019-05-02 DIAGNOSIS — I959 Hypotension, unspecified: Secondary | ICD-10-CM

## 2019-05-02 DIAGNOSIS — R079 Chest pain, unspecified: Secondary | ICD-10-CM | POA: Diagnosis not present

## 2019-05-02 DIAGNOSIS — E1129 Type 2 diabetes mellitus with other diabetic kidney complication: Secondary | ICD-10-CM | POA: Diagnosis not present

## 2019-05-02 DIAGNOSIS — Z9842 Cataract extraction status, left eye: Secondary | ICD-10-CM

## 2019-05-02 DIAGNOSIS — R0602 Shortness of breath: Secondary | ICD-10-CM | POA: Diagnosis not present

## 2019-05-02 DIAGNOSIS — E1142 Type 2 diabetes mellitus with diabetic polyneuropathy: Secondary | ICD-10-CM | POA: Diagnosis not present

## 2019-05-02 DIAGNOSIS — G4730 Sleep apnea, unspecified: Secondary | ICD-10-CM | POA: Diagnosis not present

## 2019-05-02 DIAGNOSIS — E781 Pure hyperglyceridemia: Secondary | ICD-10-CM | POA: Diagnosis present

## 2019-05-02 DIAGNOSIS — Z794 Long term (current) use of insulin: Secondary | ICD-10-CM

## 2019-05-02 DIAGNOSIS — Z743 Need for continuous supervision: Secondary | ICD-10-CM | POA: Diagnosis not present

## 2019-05-02 DIAGNOSIS — I499 Cardiac arrhythmia, unspecified: Secondary | ICD-10-CM | POA: Diagnosis not present

## 2019-05-02 DIAGNOSIS — Z79891 Long term (current) use of opiate analgesic: Secondary | ICD-10-CM

## 2019-05-02 DIAGNOSIS — D509 Iron deficiency anemia, unspecified: Secondary | ICD-10-CM | POA: Diagnosis not present

## 2019-05-02 HISTORY — DX: Ventricular tachycardia: I47.2

## 2019-05-02 HISTORY — PX: LEFT HEART CATH AND CORONARY ANGIOGRAPHY: CATH118249

## 2019-05-02 HISTORY — DX: Tachycardia, unspecified: R00.0

## 2019-05-02 HISTORY — DX: Unstable angina: I20.0

## 2019-05-02 HISTORY — DX: Ventricular tachycardia, unspecified: I47.20

## 2019-05-02 HISTORY — DX: COVID-19: U07.1

## 2019-05-02 LAB — CBC
HCT: 29.5 % — ABNORMAL LOW (ref 39.0–52.0)
Hemoglobin: 9.4 g/dL — ABNORMAL LOW (ref 13.0–17.0)
MCH: 29.4 pg (ref 26.0–34.0)
MCHC: 31.9 g/dL (ref 30.0–36.0)
MCV: 92.2 fL (ref 80.0–100.0)
Platelets: 305 10*3/uL (ref 150–400)
RBC: 3.2 MIL/uL — ABNORMAL LOW (ref 4.22–5.81)
RDW: 14.3 % (ref 11.5–15.5)
WBC: 8.8 10*3/uL (ref 4.0–10.5)
nRBC: 0 % (ref 0.0–0.2)

## 2019-05-02 LAB — I-STAT CHEM 8, ED
BUN: 16 mg/dL (ref 8–23)
BUN: 16 mg/dL (ref 8–23)
Calcium, Ion: 0.83 mmol/L — CL (ref 1.15–1.40)
Calcium, Ion: 0.83 mmol/L — CL (ref 1.15–1.40)
Chloride: 100 mmol/L (ref 98–111)
Chloride: 100 mmol/L (ref 98–111)
Creatinine, Ser: 2.4 mg/dL — ABNORMAL HIGH (ref 0.61–1.24)
Creatinine, Ser: 2.4 mg/dL — ABNORMAL HIGH (ref 0.61–1.24)
Glucose, Bld: 111 mg/dL — ABNORMAL HIGH (ref 70–99)
Glucose, Bld: 112 mg/dL — ABNORMAL HIGH (ref 70–99)
HCT: 26 % — ABNORMAL LOW (ref 39.0–52.0)
HCT: 27 % — ABNORMAL LOW (ref 39.0–52.0)
Hemoglobin: 8.8 g/dL — ABNORMAL LOW (ref 13.0–17.0)
Hemoglobin: 9.2 g/dL — ABNORMAL LOW (ref 13.0–17.0)
Potassium: 3 mmol/L — ABNORMAL LOW (ref 3.5–5.1)
Potassium: 3 mmol/L — ABNORMAL LOW (ref 3.5–5.1)
Sodium: 135 mmol/L (ref 135–145)
Sodium: 135 mmol/L (ref 135–145)
TCO2: 23 mmol/L (ref 22–32)
TCO2: 24 mmol/L (ref 22–32)

## 2019-05-02 LAB — RESPIRATORY PANEL BY RT PCR (FLU A&B, COVID)
Influenza A by PCR: NEGATIVE
Influenza B by PCR: NEGATIVE
SARS Coronavirus 2 by RT PCR: POSITIVE — AB

## 2019-05-02 LAB — BRAIN NATRIURETIC PEPTIDE: B Natriuretic Peptide: 2378.8 pg/mL — ABNORMAL HIGH (ref 0.0–100.0)

## 2019-05-02 LAB — GLUCOSE, CAPILLARY
Glucose-Capillary: 99 mg/dL (ref 70–99)
Glucose-Capillary: 129 mg/dL — ABNORMAL HIGH (ref 70–99)

## 2019-05-02 LAB — BASIC METABOLIC PANEL
Anion gap: 15 (ref 5–15)
Anion gap: 12 (ref 5–15)
BUN: 18 mg/dL (ref 8–23)
BUN: 24 mg/dL — ABNORMAL HIGH (ref 8–23)
CO2: 24 mmol/L (ref 22–32)
CO2: 24 mmol/L (ref 22–32)
Calcium: 7.7 mg/dL — ABNORMAL LOW (ref 8.9–10.3)
Calcium: 7.8 mg/dL — ABNORMAL LOW (ref 8.9–10.3)
Chloride: 96 mmol/L — ABNORMAL LOW (ref 98–111)
Chloride: 91 mmol/L — ABNORMAL LOW (ref 98–111)
Creatinine, Ser: 2.62 mg/dL — ABNORMAL HIGH (ref 0.61–1.24)
Creatinine, Ser: 3.56 mg/dL — ABNORMAL HIGH (ref 0.61–1.24)
GFR calc Af Amer: 28 mL/min — ABNORMAL LOW (ref >60)
GFR calc Af Amer: 19 mL/min — ABNORMAL LOW (ref >60)
GFR calc non Af Amer: 24 mL/min — ABNORMAL LOW (ref >60)
GFR calc non Af Amer: 17 mL/min — ABNORMAL LOW (ref >60)
Glucose, Bld: 123 mg/dL — ABNORMAL HIGH (ref 70–99)
Glucose, Bld: 97 mg/dL (ref 70–99)
Potassium: 3.3 mmol/L — ABNORMAL LOW (ref 3.5–5.1)
Potassium: 4 mmol/L (ref 3.5–5.1)
Sodium: 135 mmol/L (ref 135–145)
Sodium: 127 mmol/L — ABNORMAL LOW (ref 135–145)

## 2019-05-02 LAB — TROPONIN I (HIGH SENSITIVITY)
Troponin I (High Sensitivity): 21 ng/L — ABNORMAL HIGH (ref <18)
Troponin I (High Sensitivity): 3146 ng/L (ref <18)

## 2019-05-02 LAB — HEMOGLOBIN A1C
Hgb A1c MFr Bld: 5.6 % (ref 4.8–5.6)
Mean Plasma Glucose: 114.02 mg/dL

## 2019-05-02 LAB — MAGNESIUM: Magnesium: 1.9 mg/dL (ref 1.7–2.4)

## 2019-05-02 LAB — C-REACTIVE PROTEIN: CRP: 19.6 mg/dL — ABNORMAL HIGH (ref <1.0)

## 2019-05-02 LAB — MRSA PCR SCREENING: MRSA by PCR: NEGATIVE

## 2019-05-02 LAB — FIBRINOGEN: Fibrinogen: 763 mg/dL — ABNORMAL HIGH (ref 210–475)

## 2019-05-02 LAB — POCT ACTIVATED CLOTTING TIME: Activated Clotting Time: 153 seconds

## 2019-05-02 LAB — PROCALCITONIN: Procalcitonin: 0.31 ng/mL

## 2019-05-02 LAB — HIV ANTIBODY (ROUTINE TESTING W REFLEX): HIV Screen 4th Generation wRfx: NONREACTIVE

## 2019-05-02 LAB — D-DIMER, QUANTITATIVE: D-Dimer, Quant: 2.12 ug/mL-FEU — ABNORMAL HIGH (ref 0.00–0.50)

## 2019-05-02 LAB — LACTATE DEHYDROGENASE: LDH: 222 U/L — ABNORMAL HIGH (ref 98–192)

## 2019-05-02 LAB — HEPATITIS B SURFACE ANTIGEN: Hepatitis B Surface Ag: NONREACTIVE

## 2019-05-02 LAB — FERRITIN: Ferritin: >7500 ng/mL — ABNORMAL HIGH (ref 24–336)

## 2019-05-02 SURGERY — LEFT HEART CATH AND CORONARY ANGIOGRAPHY
Anesthesia: LOCAL

## 2019-05-02 MED ORDER — GUAIFENESIN-DM 100-10 MG/5ML PO SYRP: 10.0000 mL | ORAL_SOLUTION | ORAL | Status: DC | PRN

## 2019-05-02 MED ORDER — AMIODARONE HCL IN DEXTROSE 360-4.14 MG/200ML-% IV SOLN
30.0000 mg/h | INTRAVENOUS | Status: DC
  Filled 2019-05-02 (×2): qty 200

## 2019-05-02 MED ORDER — HYDRALAZINE HCL 20 MG/ML IJ SOLN: 10.0000 mg | INTRAMUSCULAR | Status: AC | PRN

## 2019-05-02 MED ORDER — HEPARIN SODIUM (PORCINE) 5000 UNIT/ML IJ SOLN
4000.0000 [IU] | Freq: Once | INTRAMUSCULAR | Status: AC
  Administered 2019-05-02: 4000 [IU] via INTRAVENOUS
  Filled 2019-05-02: qty 1

## 2019-05-02 MED ORDER — ONDANSETRON HCL 4 MG/2ML IJ SOLN
4.0000 mg | Freq: Once | INTRAMUSCULAR | Status: AC
  Administered 2019-05-02: 4 mg via INTRAVENOUS
  Filled 2019-05-02: qty 2

## 2019-05-02 MED ORDER — OXYCODONE-ACETAMINOPHEN 5-325 MG PO TABS
1.0000 | ORAL_TABLET | ORAL | Status: DC | PRN
  Administered 2019-05-02 – 2019-05-05 (×2): 1 via ORAL
  Filled 2019-05-02 (×2): qty 1

## 2019-05-02 MED ORDER — ASCORBIC ACID 500 MG PO TABS
500.0000 mg | ORAL_TABLET | Freq: Every day | ORAL | Status: DC
  Administered 2019-05-02 – 2019-05-05 (×4): 500 mg via ORAL
  Filled 2019-05-02 (×4): qty 1

## 2019-05-02 MED ORDER — CALCIUM GLUCONATE-NACL 1-0.675 GM/50ML-% IV SOLN
1.0000 g | Freq: Once | INTRAVENOUS | Status: AC
  Administered 2019-05-02: 1000 mg via INTRAVENOUS
  Filled 2019-05-02: qty 50

## 2019-05-02 MED ORDER — FENTANYL CITRATE (PF) 100 MCG/2ML IJ SOLN
INTRAMUSCULAR | Status: DC | PRN
  Administered 2019-05-02: 25 ug via INTRAVENOUS

## 2019-05-02 MED ORDER — ASPIRIN 81 MG PO CHEW
81.0000 mg | CHEWABLE_TABLET | Freq: Every day | ORAL | Status: DC
  Administered 2019-05-03 – 2019-05-05 (×3): 81 mg via ORAL
  Filled 2019-05-02 (×3): qty 1

## 2019-05-02 MED ORDER — AMIODARONE HCL 200 MG PO TABS: 200.0000 mg | ORAL_TABLET | Freq: Every day | ORAL | Status: DC

## 2019-05-02 MED ORDER — METOPROLOL TARTRATE 25 MG PO TABS
25.0000 mg | ORAL_TABLET | Freq: Every day | ORAL | Status: DC
  Administered 2019-05-05: 25 mg via ORAL
  Filled 2019-05-02 (×3): qty 1

## 2019-05-02 MED ORDER — AMIODARONE HCL IN DEXTROSE 360-4.14 MG/200ML-% IV SOLN
60.0000 mg/h | INTRAVENOUS | Status: AC
  Administered 2019-05-02: 60 mg/h via INTRAVENOUS
  Filled 2019-05-02: qty 200

## 2019-05-02 MED ORDER — LABETALOL HCL 5 MG/ML IV SOLN: 10.0000 mg | INTRAVENOUS | Status: AC | PRN

## 2019-05-02 MED ORDER — CLOPIDOGREL BISULFATE 75 MG PO TABS
75.0000 mg | ORAL_TABLET | Freq: Every day | ORAL | Status: DC
  Administered 2019-05-02 – 2019-05-05 (×4): 75 mg via ORAL
  Filled 2019-05-02 (×4): qty 1

## 2019-05-02 MED ORDER — MIDAZOLAM HCL 2 MG/2ML IJ SOLN
INTRAMUSCULAR | Status: DC | PRN
  Administered 2019-05-02: 1 mg via INTRAVENOUS

## 2019-05-02 MED ORDER — HEPARIN BOLUS VIA INFUSION: 4000.0000 [IU] | Freq: Once | INTRAVENOUS | Status: DC

## 2019-05-02 MED ORDER — POTASSIUM CHLORIDE CRYS ER 20 MEQ PO TBCR
40.0000 meq | EXTENDED_RELEASE_TABLET | Freq: Once | ORAL | Status: AC
  Administered 2019-05-02: 40 meq via ORAL
  Filled 2019-05-02: qty 2

## 2019-05-02 MED ORDER — HEPARIN SODIUM (PORCINE) 5000 UNIT/ML IJ SOLN
5000.0000 [IU] | Freq: Three times a day (TID) | INTRAMUSCULAR | Status: DC
  Administered 2019-05-02 – 2019-05-03 (×2): 5000 [IU] via SUBCUTANEOUS
  Filled 2019-05-02 (×2): qty 1

## 2019-05-02 MED ORDER — HYDROCOD POLST-CPM POLST ER 10-8 MG/5ML PO SUER: 5.0000 mL | Freq: Two times a day (BID) | ORAL | Status: DC | PRN

## 2019-05-02 MED ORDER — LIDOCAINE HCL (PF) 1 % IJ SOLN
INTRAMUSCULAR | Status: AC
  Filled 2019-05-02: qty 30

## 2019-05-02 MED ORDER — CHLORHEXIDINE GLUCONATE CLOTH 2 % EX PADS
6.0000 | MEDICATED_PAD | Freq: Every day | CUTANEOUS | Status: DC
  Administered 2019-05-03 – 2019-05-05 (×2): 6 via TOPICAL

## 2019-05-02 MED ORDER — ONDANSETRON HCL 4 MG/2ML IJ SOLN: 4.0000 mg | Freq: Four times a day (QID) | INTRAMUSCULAR | Status: DC | PRN

## 2019-05-02 MED ORDER — ZOLPIDEM TARTRATE 5 MG PO TABS
5.0000 mg | ORAL_TABLET | Freq: Every evening | ORAL | Status: DC | PRN
  Administered 2019-05-03: 5 mg via ORAL
  Filled 2019-05-02: qty 1

## 2019-05-02 MED ORDER — IOHEXOL 350 MG/ML SOLN
INTRAVENOUS | Status: DC | PRN
  Administered 2019-05-02: 12:00:00 80 mL

## 2019-05-02 MED ORDER — ALPRAZOLAM 0.25 MG PO TABS: 0.2500 mg | ORAL_TABLET | Freq: Two times a day (BID) | ORAL | Status: DC | PRN

## 2019-05-02 MED ORDER — SODIUM CHLORIDE 0.9 % IV SOLN: 250.0000 mL | INTRAVENOUS | Status: DC | PRN

## 2019-05-02 MED ORDER — HEPARIN (PORCINE) IN NACL 1000-0.9 UT/500ML-% IV SOLN
INTRAVENOUS | Status: DC | PRN
  Administered 2019-05-02 (×2): 500 mL

## 2019-05-02 MED ORDER — ALBUTEROL SULFATE (2.5 MG/3ML) 0.083% IN NEBU: 3.0000 mL | INHALATION_SOLUTION | Freq: Four times a day (QID) | RESPIRATORY_TRACT | Status: DC | PRN

## 2019-05-02 MED ORDER — ALBUTEROL SULFATE HFA 108 (90 BASE) MCG/ACT IN AERS
2.0000 | INHALATION_SPRAY | Freq: Four times a day (QID) | RESPIRATORY_TRACT | Status: DC
  Filled 2019-05-02: qty 6.7

## 2019-05-02 MED ORDER — HEPARIN (PORCINE) IN NACL 1000-0.9 UT/500ML-% IV SOLN
INTRAVENOUS | Status: AC
  Filled 2019-05-02: qty 1000

## 2019-05-02 MED ORDER — ZINC SULFATE 220 (50 ZN) MG PO CAPS
220.0000 mg | ORAL_CAPSULE | Freq: Every day | ORAL | Status: DC
  Administered 2019-05-02 – 2019-05-05 (×4): 220 mg via ORAL
  Filled 2019-05-02 (×4): qty 1

## 2019-05-02 MED ORDER — INSULIN DEGLUDEC 100 UNIT/ML ~~LOC~~ SOPN: 15.0000 [IU] | PEN_INJECTOR | Freq: Two times a day (BID) | SUBCUTANEOUS | Status: DC

## 2019-05-02 MED ORDER — LATANOPROST 0.005 % OP SOLN
1.0000 [drp] | Freq: Every day | OPHTHALMIC | Status: DC
  Administered 2019-05-02 – 2019-05-03 (×2): 1 [drp] via OPHTHALMIC
  Filled 2019-05-02: qty 2.5

## 2019-05-02 MED ORDER — FENTANYL CITRATE (PF) 100 MCG/2ML IJ SOLN
INTRAMUSCULAR | Status: AC
  Filled 2019-05-02: qty 2

## 2019-05-02 MED ORDER — SODIUM CHLORIDE 0.9% FLUSH: 3.0000 mL | INTRAVENOUS | Status: DC | PRN

## 2019-05-02 MED ORDER — MIDAZOLAM HCL 2 MG/2ML IJ SOLN
INTRAMUSCULAR | Status: AC
  Filled 2019-05-02: qty 2

## 2019-05-02 MED ORDER — SODIUM CHLORIDE 0.9% FLUSH
3.0000 mL | Freq: Two times a day (BID) | INTRAVENOUS | Status: DC
  Administered 2019-05-02 – 2019-05-05 (×4): 3 mL via INTRAVENOUS

## 2019-05-02 MED ORDER — INSULIN ASPART 100 UNIT/ML ~~LOC~~ SOLN
0.0000 [IU] | Freq: Three times a day (TID) | SUBCUTANEOUS | Status: DC
  Administered 2019-05-03: 3 [IU] via SUBCUTANEOUS
  Administered 2019-05-04: 5 [IU] via SUBCUTANEOUS
  Administered 2019-05-04 (×2): 3 [IU] via SUBCUTANEOUS
  Administered 2019-05-05: 2 [IU] via SUBCUTANEOUS
  Administered 2019-05-05: 3 [IU] via SUBCUTANEOUS

## 2019-05-02 MED ORDER — SODIUM CHLORIDE 0.9 % IV SOLN: 1.0000 g | Freq: Once | INTRAVENOUS | Status: DC

## 2019-05-02 MED ORDER — ASPIRIN 81 MG PO CHEW
324.0000 mg | CHEWABLE_TABLET | Freq: Once | ORAL | Status: AC
  Administered 2019-05-02: 324 mg via ORAL
  Filled 2019-05-02: qty 4

## 2019-05-02 MED ORDER — ALLOPURINOL 100 MG PO TABS
100.0000 mg | ORAL_TABLET | Freq: Every day | ORAL | Status: DC
  Administered 2019-05-02 – 2019-05-04 (×3): 100 mg via ORAL
  Filled 2019-05-02 (×3): qty 1

## 2019-05-02 MED ORDER — ATORVASTATIN CALCIUM 40 MG PO TABS
40.0000 mg | ORAL_TABLET | Freq: Every evening | ORAL | Status: DC
  Administered 2019-05-02 – 2019-05-05 (×4): 40 mg via ORAL
  Filled 2019-05-02 (×4): qty 1

## 2019-05-02 MED ORDER — ACETAMINOPHEN 325 MG PO TABS: 650.0000 mg | ORAL_TABLET | ORAL | Status: DC | PRN

## 2019-05-02 MED ORDER — SODIUM CHLORIDE 0.9 % IV SOLN
250.0000 mL | INTRAVENOUS | Status: DC | PRN
  Administered 2019-05-03 (×2): 250 mL via INTRAVENOUS

## 2019-05-02 MED ORDER — POLYETHYLENE GLYCOL 3350 17 G PO PACK: 17.0000 g | PACK | Freq: Every day | ORAL | Status: DC | PRN

## 2019-05-02 MED ORDER — INSULIN GLARGINE 100 UNIT/ML ~~LOC~~ SOLN
15.0000 [IU] | Freq: Two times a day (BID) | SUBCUTANEOUS | Status: DC
  Administered 2019-05-02 – 2019-05-05 (×6): 15 [IU] via SUBCUTANEOUS
  Filled 2019-05-02 (×8): qty 0.15

## 2019-05-02 MED ORDER — LIDOCAINE HCL (PF) 1 % IJ SOLN
INTRAMUSCULAR | Status: DC | PRN
  Administered 2019-05-02: 10 mL

## 2019-05-02 MED ORDER — CALCITRIOL 0.25 MCG PO CAPS
0.5000 ug | ORAL_CAPSULE | ORAL | Status: DC
  Administered 2019-05-05: 0.5 ug via ORAL

## 2019-05-02 MED ORDER — SODIUM CHLORIDE 0.9% FLUSH
3.0000 mL | Freq: Two times a day (BID) | INTRAVENOUS | Status: DC
  Administered 2019-05-02 – 2019-05-05 (×6): 3 mL via INTRAVENOUS

## 2019-05-02 MED ORDER — NITROGLYCERIN 0.4 MG SL SUBL: 0.4000 mg | SUBLINGUAL_TABLET | SUBLINGUAL | Status: DC | PRN

## 2019-05-02 SURGICAL SUPPLY — 11 items
CATH DXT MULTI JL4 JR4 ANG PIG (CATHETERS) ×1 IMPLANT
KIT HEART LEFT (KITS) ×2 IMPLANT
KIT MICROPUNCTURE NIT STIFF (SHEATH) ×1 IMPLANT
PACK CARDIAC CATHETERIZATION (CUSTOM PROCEDURE TRAY) ×2 IMPLANT
SHEATH PINNACLE 5F 10CM (SHEATH) ×1 IMPLANT
SHEATH PROBE COVER 6X72 (BAG) ×1 IMPLANT
SYR MEDRAD MARK 7 150ML (SYRINGE) ×2 IMPLANT
TRANSDUCER W/STOPCOCK (MISCELLANEOUS) ×2 IMPLANT
TUBING CIL FLEX 10 FLL-RA (TUBING) ×2 IMPLANT
WIRE EMERALD 3MM-J .035X150CM (WIRE) ×1 IMPLANT
WIRE HI TORQ VERSACORE-J 145CM (WIRE) ×1 IMPLANT

## 2019-05-02 NOTE — ED Notes (Signed)
I-stat Chem 8 noted iCa= 0.83 mmoI/L

## 2019-05-02 NOTE — Consult Note (Signed)
[facility] Renal Consultation Note  Requesting MD: Nelva Bush, MD Indication for Consultation: End-stage renal disease  Chief complaint: Chest pain   HPI:  James Chan is a 69 y.o. male history of end-stage renal disease on hemodialysis Monday Wednesday Friday at [facility] who presented to the ER with chest pressure.  He developed chest discomfort on dialysis and treatment was terminated early and he presented to the ER.  He had an abnormal EKG and ultimately underwent heart cath.  There was no new acute change noted with chronic disease appreciated.  He was also found to be Covid positive.  He received about 2 hours of dialysis prior to treatment termination due to the chest pain, concern for arrhythmia, and hypotension.  Note that the treatment prior he left below dry weight at 86.7 kg.  Course earlier today was complicated by what appeared to be A. fib with RVR per EP.  He was started on amiodarone.  He feels as though he would be fine to wait until Monday for his next HD.  He denies any current chest pain shortness of breath nausea or vomiting.  He has some abdominal discomfort.  No vision changes.  No dizziness or cramping.  PMHx:   Past Medical History:  Diagnosis Date  . Anemia   . Anxiety state, unspecified 09/15/2008   Qualifier: Diagnosis of  By: Bullins CMA, Ami  , patient denies  . Arterial insufficiency of lower extremity ([place]) 05/10/2016  . Arthritis    elbows  . BACK PAIN, LUMBAR, CHRONIC 09/15/2008   Qualifier: Diagnosis of  By: Bullins CMA, Ami    . CHF (congestive heart failure) ([place])   . Claudication ([place]) 10/30/2014  . Comprehensive diabetic foot examination, type 2 DM, encounter for [facility]) 09/15/2008   Qualifier: Diagnosis of  By: Bullins CMA, Ami    . DEPRESSIVE DISORDER 09/15/2008   Qualifier: Diagnosis of  By: Bullins CMA, Ami    . Diabetes mellitus without complication ([place])   . Diabetic polyneuropathy associated with diabetes mellitus due to  underlying condition ([place]) 06/28/2015  . Dyslipidemia 11/24/2015  . Dyspnea    with exertion  . End stage renal disease on dialysis ([place])    M/W/F [place]  . Esophageal reflux 09/15/2008   Qualifier: Diagnosis of  By: Bullins CMA, Ami    . Glaucoma   . Gout   . Hammer toes of both feet 06/28/2015  . History of blood transfusion   . History of carotid artery stenosis 10/30/2014  . 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 Hyperlipidemia   . Hypertension   . Hypertensive heart failure ([place]) 11/28/2016  . HYPERTRIGLYCERIDEMIA 09/15/2008   Qualifier: Diagnosis of  By: Bullins CMA, Ami    . Myocardial infarction ([employer]) 1997  . NSTEMI (non-ST elevated myocardial infarction) ([place]) 09/15/2008   Qualifier: Diagnosis of  By: Bullins CMA, Ami    . Onychomycosis due to dermatophyte 06/28/2015  . PAD (peripheral artery disease) ([employer]) 10/30/2014  . Sleep apnea    no CPAP  . Stroke [facility])    no residual    Past Surgical History:  Procedure Laterality Date  . angioplasty of iliofemoral and iliac artery with stent placement    . AV FISTULA PLACEMENT Right 07/04/2018   Procedure: CREATION BASILIC VEIN ARTERIOVENOUS FISTULA RIGHT ARM;  Surgeon: [name], MD;  Location: [place];  Service: Vascular;  Laterality: Right;  . BASCILIC VEIN TRANSPOSITION Right 10/10/2018   Procedure: BASILIC VEIN TRANSPOSITION SECOND  STAGE Right upper arm;  Surgeon: [name], MD;  Location: [facility] OR;  Service: Vascular;  Laterality: Right;  . CARDIAC CATHETERIZATION    . CAROTID STENT Bilateral   . CATARACT EXTRACTION W/ INTRAOCULAR LENS  IMPLANT, BILATERAL    . COLONOSCOPY W/ POLYPECTOMY    . CORONARY ANGIOPLASTY     stents x 5  . INSERTION OF DIALYSIS CATHETER    . KNEE SURGERY     right/ left worked on ligaments and tendons  . LEFT HEART CATH AND CORONARY ANGIOGRAPHY N/A 05/02/2019   Procedure: LEFT HEART CATH AND CORONARY ANGIOGRAPHY;  Surgeon: Nelva Bush, MD;  Location: [place] CV LAB;   Service: Cardiovascular;  Laterality: N/A;  . OTHER SURGICAL HISTORY     reports 32 stents to include being in eye    Family Hx:  Family History  Problem Relation Age of Onset  . Heart disease Mother   . Cancer Mother   . Heart disease Father   . Cancer Father     Social History:  reports that he quit smoking about 23 years ago. He quit after 36.00 years of use. He has never used smokeless tobacco. He reports that he does not drink alcohol or use drugs.  Allergies: No Known Allergies  Medications: Prior to Admission medications   Medication Sig Start Date End Date Taking? Authorizing Provider  allopurinol (ZYLOPRIM) 100 MG tablet Take 100 mg by mouth at bedtime.     [provider]  amiodarone (PACERONE) 200 MG tablet TAKE 1 TABLET BY MOUTH  DAILY 12/26/18   Richardo Priest, MD  atorvastatin (LIPITOR) 40 MG tablet Take 1 tablet (40 mg total) by mouth every evening. 03/04/19   Richardo Priest, MD  cloNIDine (CATAPRES) 0.1 MG tablet Take 0.1mg  (one tablet) on non-hemodialysis days if systolic blood pressure is >180.    [provider]  clopidogrel (PLAVIX) 75 MG tablet Take 75 mg by mouth daily with supper.     [provider]  hydrALAZINE (APRESOLINE) 25 MG tablet Take 25 mg by mouth as directed. Take 25mg  BID on Mon, Wed, and Fri.  Take 50mg  TID on Tues, Thurs, Sat, and Sun    [provider]  insulin degludec (TRESIBA FLEXTOUCH) 100 UNIT/ML SOPN FlexTouch Pen Inject 15-30 Units into the skin See admin instructions. Inject 15-30 units twice daily    [provider]  latanoprost (XALATAN) 0.005 % ophthalmic solution Place 1 drop into both eyes at bedtime. 06/16/14   [provider]  metoprolol tartrate (LOPRESSOR) 25 MG tablet Take 25 mg by mouth daily.  05/23/18   [provider]  nitroGLYCERIN (NITROSTAT) 0.4 MG SL tablet Place 1 tablet (0.4 mg total) under the tongue every 5 (five) minutes as needed for chest pain. 06/08/17    Richardo Priest, MD  oxyCODONE-acetaminophen (PERCOCET) 5-325 MG tablet Take 1 tablet by mouth every 4 (four) hours as needed for severe pain. 10/10/18 10/10/19  [name], MD  polyethylene glycol (MIRALAX / Floria Raveling) packet Take 17 g by mouth daily as needed for mild constipation.    [provider]  PROAIR HFA 108 [phone] Base) MCG/ACT inhaler Inhale 2 puffs into the lungs every 6 (six) hours as needed for wheezing or shortness of breath.  10/01/17   [provider]    I have reviewed the patient's current and reported prior to admission medications.  Labs:  BMP Latest Ref Rng & Units 05/02/2019 05/02/2019 05/02/2019  Glucose 70 -  99 mg/dL 111(H) 123(H) 112(H)  BUN 8 - 23 mg/dL 16 18 16   Creatinine 0.61 - 1.24 mg/dL 2.40(H) 2.62(H) 2.40(H)  BUN/Creat Ratio 10 - 24 - - -  Sodium 135 - 145 mmol/L 135 135 135  Potassium 3.5 - 5.1 mmol/L 3.0(L) 3.3(L) 3.0(L)  Chloride 98 - 111 mmol/L 100 96(L) 100  CO2 22 - 32 mmol/L - 24 -  Calcium 8.9 - 10.3 mg/dL - 7.7(L) -    ROS:  Pertinent items noted in HPI and remainder of comprehensive ROS otherwise negative.    Physical Exam: Vitals:   05/02/19 1515 05/02/19 1530  BP:  129/81  Pulse:    Resp: (!) 23 (!) 21  Temp:    SpO2: 100% 97%     General: Adult male in bed in no acute distress at rest HEENT: Normocephalic atraumatic Eyes: Extra ocular movements intact sclera anicteric Neck: Supple trachea midline Heart: S1-S2 no rub appreciated Lungs: Clear to auscultation but reduced on exam.  Unlabored at rest on room air Abdomen: Soft distended/obese habitus and nontender. Extremities: Skin: No rash on extremities exposed no cyanosis or clubbing Neuro: Alert and oriented and follows commands provides a history Psych normal mood and affect Access right upper extremity AV fistula with bruit and thrill  Outpatient hemodialysis orders MWF at [facility] 4hr, 400/800, EDW 87.3kg, 2K/2.25Ca, UF profile 2, AVF, heparin  2000 bolus - Calcitriol 0.5mcg PO q HD - No current ESA orders - on hold, last given Mircera 30mcg on 04/09/19  Last HD: 1/8 - approx 2 hours of treatment prior to EMS being called for CP, arrhythmia, hypotension 1/6 - left below EDW at 86.7kg   Assessment/Plan:  # Coronary artery disease with chest pain -Per cardiology  # End-stage renal disease on hemodialysis - Normally HD is per Monday Wednesday Friday schedule - We will assess dialysis needs daily - No acute indication for HD and had partial tx today -anticipate next treatment on Monday unless needed sooner  #COVID-19 infection - Therapies per primary team He is on room air  # Chronic heart failure with preserved EF -Appears compensated on exam  # Hypokalemia - Repeat BMP  - Noted potassium was provided this morning.   - Earlier labs were postdialysis   # Anemia of CKD - last had mircera 60 mcg on 04/09/19  - trend and anticipate redose  # Secondary hyperpara - continue calcitriol    Claudia Desanctis 05/02/2019, 3:46 PM

## 2019-05-02 NOTE — Consult Note (Addendum)
Medical Consultation   James Chan  TDV:[id number]  DOB: 1950/06/22  DOA: 05/02/2019  PCP: Cher Nakai, MD   Requesting physician: Rosaria Ferries, PA-C  Reason for consultation: COVID-19  History of Present Illness: James Chan is an 69 y.o. male  With past medical history significant for CAD s/p PCI, PAF, ESRD on HD(M/W/F), diastolic CHF, PVD, diabetes mellitus type 2, and s/p CEA.  Patient presented with complaints of palpitations and dizziness whenever changing positions. He went to dialysis this morning and developed chest pain. He was found to be in a wide-complex tachycardia for which EMS was called.  His heart rate was initially noted to be greater than 170 and was temporary hypotensive.  Cardiology formally admitted the patient on admission as he was noted to have a mild elevation in his troponin 21 and for ongoing chest pain.  Patient was taken for cardiac cath.  Patient was incidentally noted to be positive for COVID-19. He reports that he has been coughing over the last 2 weeks, but the cough has been improving this week.  He reports chronically being short of breath, but does not require oxygen.  O2 saturations were noted to be maintained on room air.  Chest x-ray significant for cardiomegaly with clear lungs.  Further evaluation of the wide-complex tachycardia appeared to show atrial fibrillation and patient was started on amiodarone drip.     Review of Systems  Constitutional: Negative for fever.  Respiratory: Positive for cough and shortness of breath.   Cardiovascular: Positive for chest pain and palpitations. Negative for leg swelling.  Gastrointestinal: Positive for abdominal pain.  Genitourinary: Negative for dysuria and frequency.   As per HPI otherwise 10 point review of systems negative.    Past Medical History: Past Medical History:  Diagnosis Date  . Anemia   . Anxiety state, unspecified 09/15/2008   Qualifier: Diagnosis of  By: Bullins  CMA, Ami  , patient denies  . Arterial insufficiency of lower extremity ([place]) 05/10/2016  . Arthritis    elbows  . BACK PAIN, LUMBAR, CHRONIC 09/15/2008   Qualifier: Diagnosis of  By: Bullins CMA, Ami    . CHF (congestive heart failure) ([place])   . Claudication ([place]) 10/30/2014  . Comprehensive diabetic foot examination, type 2 DM, encounter for [facility]) 09/15/2008   Qualifier: Diagnosis of  By: Bullins CMA, Ami    . DEPRESSIVE DISORDER 09/15/2008   Qualifier: Diagnosis of  By: Bullins CMA, Ami    . Diabetes mellitus without complication ([place])   . Diabetic polyneuropathy associated with diabetes mellitus due to underlying condition ([place]) 06/28/2015  . Dyslipidemia 11/24/2015  . Dyspnea    with exertion  . End stage renal disease on dialysis ([place])    M/W/F [place]  . Esophageal reflux 09/15/2008   Qualifier: Diagnosis of  By: Bullins CMA, Ami    . Glaucoma   . Gout   . Hammer toes of both feet 06/28/2015  . History of blood transfusion   . History of carotid artery stenosis 10/30/2014  . 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 Hyperlipidemia   . Hypertension   . Hypertensive heart failure ([place]) 11/28/2016  . HYPERTRIGLYCERIDEMIA 09/15/2008   Qualifier: Diagnosis of  By: Bullins CMA, Ami    . Myocardial infarction ([place]) 1997  . NSTEMI (non-ST elevated myocardial infarction) ([place]) 09/15/2008   Qualifier: Diagnosis of  By: Bullins CMA, Ami    .  Onychomycosis due to dermatophyte 06/28/2015  . PAD (peripheral artery disease) ([place]) 10/30/2014  . Sleep apnea    no CPAP  . Stroke [facility])    no residual    Past Surgical History: Past Surgical History:  Procedure Laterality Date  . angioplasty of iliofemoral and iliac artery with stent placement    . AV FISTULA PLACEMENT Right 07/04/2018   Procedure: CREATION BASILIC VEIN ARTERIOVENOUS FISTULA RIGHT ARM;  Surgeon: [name], MD;  Location: [place];  Service: Vascular;  Laterality: Right;  . BASCILIC VEIN TRANSPOSITION Right  10/10/2018   Procedure: BASILIC VEIN TRANSPOSITION SECOND STAGE Right upper arm;  Surgeon: [name], MD;  Location: [place];  Service: Vascular;  Laterality: Right;  . CARDIAC CATHETERIZATION    . CAROTID STENT Bilateral   . CATARACT EXTRACTION W/ INTRAOCULAR LENS  IMPLANT, BILATERAL    . COLONOSCOPY W/ POLYPECTOMY    . CORONARY ANGIOPLASTY     stents x 5  . INSERTION OF DIALYSIS CATHETER    . KNEE SURGERY     right/ left worked on ligaments and tendons  . OTHER SURGICAL HISTORY     reports 32 stents to include being in eye     Allergies:  No Known Allergies   Social History:  reports that he quit smoking about 23 years ago. He quit after 36.00 years of use. He has never used smokeless tobacco. He reports that he does not drink alcohol or use drugs.   Family History: Family History  Problem Relation Age of Onset  . Heart disease Mother   . Cancer Mother   . Heart disease Father   . Cancer Father      Physical Exam: Vitals:   05/02/19 1223 05/02/19 1227 05/02/19 1232 05/02/19 1237  BP: (!) 154/77 (!) 159/77    Pulse: 69 69 (!) 0 (!) 0  Resp: (!) 7 15    Temp:      TempSrc:      SpO2: 100% 100%    Weight:      Height:        Constitutional: Elderly male who is alert and awake, oriented x3, not in any acute distress. Eyes: PERLA, EOMI, irises appear normal, anicteric sclera,  ENMT: external ears and nose appear normal, Lips appears normal, oropharynx mucosa, tongue, posterior pharynx appear normal  Neck: neck appears normal, no masses, normal ROM, no thyromegaly, no JVD  CVS: S1-S2 clear, no murmur rubs or gallops, no LE edema, normal pedal pulses.  Right upper AV fistula. Respiratory: Normal respiratory effort currently maintaining O2 saturations on room air.  No crackles or rhonchi appreciated.  No accessory muscle use. Abdomen: soft nontender, nondistended, normal bowel sounds, no hepatosplenomegaly, no hernias  Musculoskeletal: : no cyanosis, clubbing or  edema noted bilateral Neuro: Cranial nerves II-XII intact, strength, sensation, reflexes Psych: judgement and insight appear normal, stable mood and affect, mental status Skin: no rashes or lesions or ulcers, no induration or nodules    Data reviewed:  I have personally reviewed following labs and imaging studies Labs:  CBC: Recent Labs  Lab 05/02/19 0939 05/02/19 0953  WBC 8.8  --   HGB 9.4*  8.8* 9.2*  HCT 29.5*  26.0* 27.0*  MCV 92.2  --   PLT 305  --     Basic Metabolic Panel: Recent Labs  Lab 05/02/19 0939 05/02/19 0953  NA 135  135 135  K 3.3*  3.0* 3.0*  CL 96*  100 100  CO2  24  --   GLUCOSE 123*  112* 111*  BUN 18  16 16   CREATININE 2.62*  2.40* 2.40*  CALCIUM 7.7*  --   MG 1.9  --    GFR Estimated Creatinine Clearance: 33.2 mL/min (A) (by C-G formula based on SCr of 2.4 mg/dL (H)). Liver Function Tests: No results for input(s): AST, ALT, ALKPHOS, BILITOT, PROT, ALBUMIN in the last 168 hours. No results for input(s): LIPASE, AMYLASE in the last 168 hours. No results for input(s): AMMONIA in the last 168 hours. Coagulation profile No results for input(s): INR, PROTIME in the last 168 hours.  Cardiac Enzymes: No results for input(s): CKTOTAL, CKMB, CKMBINDEX, TROPONINI in the last 168 hours. BNP: Invalid input(s): POCBNP CBG: No results for input(s): GLUCAP in the last 168 hours. D-Dimer No results for input(s): DDIMER in the last 72 hours. Hgb A1c No results for input(s): HGBA1C in the last 72 hours. Lipid Profile No results for input(s): CHOL, HDL, LDLCALC, TRIG, CHOLHDL, LDLDIRECT in the last 72 hours. Thyroid function studies No results for input(s): TSH, T4TOTAL, T3FREE, THYROIDAB in the last 72 hours.  Invalid input(s): FREET3 Anemia work up No results for input(s): VITAMINB12, FOLATE, FERRITIN, TIBC, IRON, RETICCTPCT in the last 72 hours. Urinalysis    Component Value Date/Time   COLORURINE STRAW (A) 05/08/2018 2225   APPEARANCEUR  CLEAR 05/08/2018 2225   LABSPEC 1.009 05/08/2018 2225   PHURINE 5.0 05/08/2018 2225   GLUCOSEU NEGATIVE 05/08/2018 2225   HGBUR NEGATIVE 05/08/2018 2225   BILIRUBINUR NEGATIVE 05/08/2018 [place] 05/08/2018 2225   PROTEINUR 30 (A) 05/08/2018 2225   NITRITE NEGATIVE 05/08/2018 [place] 05/08/2018 2225     Microbiology Recent Results (from the past 240 hour(s))  Respiratory Panel by RT PCR (Flu A&B, Covid) - Nasopharyngeal Swab     Status: Abnormal   Collection Time: 05/02/19  9:33 AM   Specimen: Nasopharyngeal Swab  Result Value Ref Range Status   SARS Coronavirus 2 by RT PCR POSITIVE (A) NEGATIVE Final    Comment: RESULT CALLED TO, READ BACK BY AND VERIFIED WITH: Cath Lab 11:25 05/02/19 ([email or web address]) (NOTE) SARS-CoV-2 target nucleic acids are DETECTED. SARS-CoV-2 RNA is generally detectable in upper respiratory specimens  during the acute phase of infection. Positive results are indicative of the presence of the identified virus, but do not rule out bacterial infection or co-infection with other pathogens not detected by the test. Clinical correlation with patient history and other diagnostic information is necessary to determine patient infection status. The expected result is Negative. Fact Sheet for Patients:  PinkCheek.be Fact Sheet for Healthcare Providers: GravelBags.it This test is not yet approved or cleared by the [place] FDA and  has been authorized for detection and/or diagnosis of SARS-CoV-2 by FDA under an Emergency Use Authorization (EUA).  This EUA will remain in effect (meaning this test can be used) for t he duration of  the COVID-19 declaration under Section 564(b)(1) of the Act, 21 U.S.C. section 360bbb-3(b)(1), unless the authorization is terminated or revoked sooner.    Influenza A by PCR NEGATIVE NEGATIVE Final   Influenza B by PCR NEGATIVE NEGATIVE Final     Comment: (NOTE) The Xpert Xpress SARS-CoV-2/FLU/RSV assay is intended as an aid in  the diagnosis of influenza from Nasopharyngeal swab specimens and  should not be used as a sole basis for treatment. Nasal washings and  aspirates are unacceptable for Xpert Xpress SARS-CoV-2/FLU/RSV  testing. Fact Sheet for Patients: PinkCheek.be  Fact Sheet for Healthcare Providers: GravelBags.it This test is not yet approved or cleared by the [place] FDA and  has been authorized for detection and/or diagnosis of SARS-CoV-2 by  FDA under an Emergency Use Authorization (EUA). This EUA will remain  in effect (meaning this test can be used) for the duration of the  Covid-19 declaration under Section 564(b)(1) of the Act, 21  U.S.C. section 360bbb-3(b)(1), unless the authorization is  terminated or revoked. Performed at [facility], [place] [address], [place], [place] [address]        Inpatient Medications:   Scheduled Meds: . allopurinol  100 mg Oral QHS  . amiodarone  200 mg Oral Daily  . atorvastatin  40 mg Oral QPM  . clopidogrel  75 mg Oral Q supper  . insulin degludec  15 Units Subcutaneous BID AC  . latanoprost  1 drop Both Eyes QHS  . metoprolol tartrate  25 mg Oral Daily   Continuous Infusions:   Radiological Exams on Admission: CARDIAC CATHETERIZATION  Result Date: 05/02/2019 Conclusions: 1. Multivessel coronary artery disease including 50-60% distal LMCA stenosis, 30% proximal LAD and 50% ostial D1 lesions, and chronic total occlusions of distal LCx and long stented segment of the proximal through distal RCA.  Occlusions of the LCx and RCA are known to be chronic based on prior outside catheterization reports. 2. Severely reduced left ventricular systolic function (LVEF ~60%) with mildly elevated filling pressure. Recommendations: 1. Medical therapy of worsening cardiomyopathy complicated by wide-complex tachycardia.   Recommend formal consultation with EP. 2. Medical therapy of left main disease and LCx/RCA chronic total occlusions in the setting of acute comorbidities.  When patient has been optimized from a heart failure standpoint and COVID-19 resolved, revascularization options may need to be readdressed. 3. Optimize evidence-based heart failure therapy. 4. Consultation with internal medicine and/or critical care medicine to assist with management of COVID-19 infection. Nelva Bush, MD [facility]   DG Chest Port 1 View  Result Date: 05/02/2019 CLINICAL DATA:  Chest pain EXAM: PORTABLE CHEST 1 VIEW COMPARISON:  May 08, 2018 FINDINGS: No edema or consolidation. There is cardiomegaly with pulmonary vascularity normal. No adenopathy. There is aortic atherosclerosis. No bone lesions. No pneumothorax. IMPRESSION: Cardiomegaly. Lungs clear. No adenopathy. Aortic Atherosclerosis (ICD10-I70.0). Electronically Signed   By: Lowella Grip III M.D.   On: 05/02/2019 09:47    Impression/Recommendations Unstable angina: Patient status post cardiac cath with results as seen above.  Medical therapy was recommended and EP cardiology was formally consulted. -Per cardiology  Atrial fibrillation: Patient was started on IV amiodarone. -Per cardiology  COVID-19 positive: Patient's chest x-ray otherwise noted to be clear and currently does not appear to require oxygen to maintain O2 saturations greater than 90% on room air.  At this time he would not necessarily qualify my for being started on remdesivir or Decadron.  If patient develops hypoxia or new changes on chest x-ray would initiate at that time. -COVID-19 focus order set utilized -Albuterol inhaler as needed -Vitamin C and zinc -Antitussives as needed  Anemia of chronic disease: Hemoglobin 9.4 which appears near patient's baseline. -Continue to monitor  ESRD on HD: Patient normally dialyzes Monday, Wednesday, and Friday. -Per nephrology  Hypokalemia:  Initial potassium noted to be 3.  Patient was given 40 mEq of potassium chloride. -Continue to monitor and replace as needed  Thank you for this consultation.  Our [facility] hospitalist team will follow the patient with you.   Time Spent: 20 minutes.  Norval Morton M.D. [facility] 05/02/2019, 1:03 PM

## 2019-05-02 NOTE — H&P (Addendum)
Cardiology Admission History and Physical:   Patient ID: James Chan; MRN: [id number]; DOB: [birth year]   Admission date: 05/02/2019  Primary Care Provider: Cher Nakai, MD Primary Cardiologist: James More, MD 03/04/2019 Primary Electrophysiologist: None    Chief Complaint: Chest pain  Patient Profile:   James Chan is a 69 y.o. 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   History of Present Illness:   James Chan was doing well when seen by Dr. Bettina Chan in November.  He is compliant with dialysis appointments and has not been gaining much weight in between.  He is on Plavix alone, no aspirin secondary to excessive bleeding.  He developed some GI symptoms, some abdominal discomfort and so has not been eating or drinking as usual.  He has been coming to dialysis appointments at or just 1 pound above his dry weight.  His heart rate was 50, with concern for sick sinus syndrome.  For a day or so, he has been having problems with getting lightheaded or dizzy when he stands up.  Sometimes he feels like his heart is going fast, but not consistently.  Showed a minimum heart rate of 46, but no high-grade heart block and no pauses greater than 3 seconds.  A 14 beat run of nonsustained VT was noted.  Today, he did not feel well when he got up this morning.  At about 6 AM, as he was getting ready to go to dialysis, he developed chest pain.  It was a pressure.  It reached a 7/10.  He did not take any medications for it.  He went on to dialysis.  While in dialysis, he developed a wide-complex tachycardia along with a chest pain.  EMS was called.  According to EMS, his heart rate at times  will be greater than 170.  The high heart rates seem to make his chest pain worse.  He was hypotensive then at that time did not have IV access, so was not given any medications.  In the ER, his chest pain has eased off, currently at 2/10.  He cannot remember if this is the same as his pre-PCI chest pain.    Past Medical History:  Diagnosis Date  . Anemia   . Anxiety state, unspecified 09/15/2008   Qualifier: Diagnosis of  By: James Chan  , patient denies  . Arterial insufficiency of lower extremity ([place]) 05/10/2016  . Arthritis    elbows  . BACK PAIN, LUMBAR, CHRONIC 09/15/2008   Qualifier: Diagnosis of  By: James Chan    . CHF (congestive heart failure) ([place])   . Claudication ([place]) 10/30/2014  . Comprehensive diabetic foot examination, type 2 DM, encounter for [facility]) 09/15/2008   Qualifier: Diagnosis of  By: James Chan    . DEPRESSIVE DISORDER 09/15/2008   Qualifier: Diagnosis of  By: James Chan    . Diabetes mellitus without complication ([place])   . Diabetic polyneuropathy associated with diabetes mellitus due to underlying condition ([place]) 06/28/2015  . Dyslipidemia 11/24/2015  . Dyspnea    with exertion  . James Chan stage renal disease on dialysis ([place])    M/W/F [place]  . Esophageal reflux 09/15/2008  Qualifier: Diagnosis of  By: James Chan    . Glaucoma   . Gout   . Hammer toes of both feet 06/28/2015  . History of blood transfusion   . History of carotid artery stenosis 10/30/2014  . 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 Hyperlipidemia   . Hypertension   . Hypertensive heart failure ([place]) 11/28/2016  . HYPERTRIGLYCERIDEMIA 09/15/2008   Qualifier: Diagnosis of  By: James Chan    . Myocardial infarction ([place]) 1997  . NSTEMI (non-ST elevated myocardial infarction) ([place]) 09/15/2008   Qualifier: Diagnosis of  By: James Chan    . Onychomycosis due to dermatophyte 06/28/2015  . PAD (peripheral artery disease) ([place]) 10/30/2014  . Sleep  apnea    no CPAP  . Stroke [facility])    no residual    Past Surgical History:  Procedure Laterality Date  . angioplasty of iliofemoral and iliac artery with stent placement    . AV FISTULA PLACEMENT Right 07/04/2018   Procedure: CREATION BASILIC VEIN ARTERIOVENOUS FISTULA RIGHT ARM;  Surgeon: James Mitchell, MD;  Location: [place];  Service: Vascular;  Laterality: Right;  . BASCILIC VEIN TRANSPOSITION Right 10/10/2018   Procedure: BASILIC VEIN TRANSPOSITION SECOND STAGE Right upper arm;  Surgeon: James Mitchell, MD;  Location: [place];  Service: Vascular;  Laterality: Right;  . CARDIAC CATHETERIZATION    . CAROTID STENT Bilateral   . CATARACT EXTRACTION W/ INTRAOCULAR LENS  IMPLANT, BILATERAL    . COLONOSCOPY W/ POLYPECTOMY    . CORONARY ANGIOPLASTY     stents x 5  . INSERTION OF DIALYSIS CATHETER    . KNEE SURGERY     right/ left worked on ligaments and tendons  . OTHER SURGICAL HISTORY     reports 32 stents to include being in eye     Medications Prior to Admission: Prior to Admission medications   Medication Sig Start Date [name] Date Taking? Authorizing Provider  allopurinol (ZYLOPRIM) 100 MG tablet Take 100 mg by mouth at bedtime.     [provider]  amiodarone (PACERONE) 200 MG tablet TAKE 1 TABLET BY MOUTH  DAILY 12/26/18   James Priest, MD  atorvastatin (LIPITOR) 40 MG tablet Take 1 tablet (40 mg total) by mouth every evening. 03/04/19   James Priest, MD  cloNIDine (CATAPRES) 0.1 MG tablet Take 0.1mg  (one tablet) on non-hemodialysis days if systolic blood pressure is >180.    [provider]  clopidogrel (PLAVIX) 75 MG tablet Take 75 mg by mouth daily with supper.     [provider]  hydrALAZINE (APRESOLINE) 25 MG tablet Take 25 mg by mouth as directed. Take 25mg  BID on Mon, Wed, and Fri.  Take 50mg  TID on Tues, Thurs, Sat, and Sun    [provider]  insulin degludec (TRESIBA FLEXTOUCH) 100 UNIT/ML SOPN FlexTouch Pen Inject 15-30 Units into  the skin See admin instructions. Inject 15-30 units twice daily    [provider]  latanoprost (XALATAN) 0.005 % ophthalmic solution Place 1 drop into both eyes at bedtime. 06/16/14   [provider]  metoprolol tartrate (LOPRESSOR) 25 MG tablet Take 25 mg by mouth daily.  05/23/18   [provider]  nitroGLYCERIN (NITROSTAT) 0.4 MG SL tablet Place 1 tablet (0.4 mg total) under the tongue every 5 (five) minutes as needed for chest pain. 06/08/17   James Priest, MD  oxyCODONE-acetaminophen (PERCOCET) 5-325 MG tablet Take 1 tablet by mouth every 4 (  four) hours as needed for severe pain. 10/10/18 10/10/19  James Mitchell, MD  polyethylene glycol (MIRALAX / Floria Raveling) packet Take 17 g by mouth daily as needed for mild constipation.    [provider]  PROAIR HFA 108 [phone] Base) MCG/ACT inhaler Inhale 2 puffs into the lungs every 6 (six) hours as needed for wheezing or shortness of breath.  10/01/17   [provider]     Allergies:   No Known Allergies  Social History:   Social History   Socioeconomic History  . Marital status: Married    Spouse name: Not on file  . Number of children: Not on file  . Years of education: Not on file  . Highest education level: Not on file  Occupational History  . Not on file  Tobacco Use  . Smoking status: Former Smoker    Years: 36.00    Quit date: 05/10/1995    Years since quitting: 23.9  . Smokeless tobacco: Never Used  Substance and Sexual Activity  . Alcohol use: No  . Drug use: No  . Sexual activity: Not on file  Other Topics Concern  . Not on file  Social History Narrative  . Not on file   Social Determinants of Health   Financial Resource Strain:   . Difficulty of Paying Living Expenses: Not on file  Food Insecurity:   . Worried About Charity fundraiser in the Last Year: Not on file  . Ran Out of Food in the Last Year: Not on file  Transportation Needs:   . Lack of Transportation (Medical): Not  on file  . Lack of Transportation (Non-Medical): Not on file  Physical Activity:   . Days of Exercise per Week: Not on file  . Minutes of Exercise per Session: Not on file  Stress:   . Feeling of Stress : Not on file  Social Connections:   . Frequency of Communication with Friends and Family: Not on file  . Frequency of Social Gatherings with Friends and Family: Not on file  . Attends Religious Services: Not on file  . Active Member of Clubs or Organizations: Not on file  . Attends Archivist Meetings: Not on file  . Marital Status: Not on file  Intimate Partner Violence:   . Fear of Current or Ex-Partner: Not on file  . Emotionally Abused: Not on file  . Physically Abused: Not on file  . Sexually Abused: Not on file    Family History:  The patient's family history includes Cancer in his father and mother; Heart disease in his father and mother.   The patient He indicated that his mother is deceased. He indicated that his father is deceased.   ROS:  Please see the history of present illness.  All other ROS reviewed and negative.     Physical Exam/Data:   Vitals:   05/02/19 1003 05/02/19 1015 05/02/19 1030 05/02/19 1045  BP: 101/69 109/65 107/71 92/69  Pulse: (!) 126 (!) 123 (!) 127 (!) 132  Resp: 15 16 19 18   Temp:      TempSrc:      SpO2: 99% 100% 98% 97%  Weight:      Height:       No intake or output data in the 24 hours ending 05/02/19 1059 Filed Weights   05/02/19 0932  Weight: 86.2 kg   Body mass index is 25.77 kg/m.  General:  Well nourished, well developed, male in acute distress HEENT: normal  Lymph: no adenopathy Neck:  JVD not elevated Endocrine:  No thryomegaly Vascular: No carotid bruits; LUE extremity pulse 2+, LE pulses decreased bilaterally, RUE w/ HD graft Cardiac:  normal S1, S2; RRR; no significant murmur, no rub or gallop  Lungs:  clear to auscultation bilaterally, no wheezing, rhonchi or rales  Abd: soft, mildly diffusely tender,  no hepatomegaly  Ext: No edema Musculoskeletal:  No deformities, BUE and BLE strength normal and equal Skin: warm and dry  Neuro:  CNs 2-12 intact, no focal abnormalities noted Psych:  Normal affect    EKG:  The ECG was personally reviewed and reviewed by Dr. Lovena Le: History of left bundle branch block at baseline with QRS duration 140 ms. EMS ECG is wide-complex tachycardia, heart rate 136 bpm.  QRS duration 150 ms, wider than usual for him.  There is obvious aberrancy, and it is irregular.  Per Dr. Lovena Le, this is a sick heart and his heart may be getting inadequate perfusion.  It is also consistent with electrolyte abnormalities.  He feels that this could be accelerated idioventricular rhythm, but does not feel it is NSVT  Telemetry: He is going in and out of the wide complex tachycardia.  His baseline rhythm is difficult to determine, no P waves are seen.  He is tachycardic, with a heart rate generally in the 120s.  Relevant CV Studies:  ECHO: 04/23/2019 1. Left ventricular ejection fraction, by visual estimation, is 40 to 45%. The left ventricle has mild to moderately decreased function with global hypokinesis. The left ventricular cavity size is mildy dilated. There is moderate to severe increased eccentric left ventricular hypertrophy. 2. Left ventricular diastolic parameters are consistent with Grade II diastolic dysfunction (pseudonormalization). 3. The Global left ventricular strain is abnormal (-6.2%). 4. Global right ventricle has normal systolic function.The right ventricular size is normal. No increase in right ventricular wall thickness. 5. Left atrial size was mildly dilated. 6. Right atrial size was mildly dilated. 7. The mitral valve is normal in structure. Mild to moderate mitral valve regurgitation. No evidence of mitral stenosis. 8. The tricuspid valve is normal in structure. 9. The aortic valve is trileaflet and thickened. Aortic regurgitation is not visualized.  Mild aortic valve sclerosis without stenosis. 10. The pulmonic valve was normal in structure. Pulmonic valve regurgitation is not visualized. 11. There is mild dilatation of the ascending aorta measuring 39 mm. 12. Mild Pulmonary Hypertension. Pulmonary artery systolic pressure 38 mmHg. 13. Compared to TTE done in July 2019, the Left ventricular ejection fraction is reduced from 50- 55% to now 40-45%. The mitral regurgitation is no longer moderate to severe.  Heart Cath: 03/2016 in [facility] MVCAD,CTO of long RCA stented segment,collaterized and CTO-distal small circ/OM2 stent.There were no good PCI options for RCA since it had been treated multiple times here and in [place] coupled with the desire to avoid prolonged DAPT since he has had recent anemia from GI bleed.A LV gram wasn't done due to renal function, but an echocardiogram showed inferior wall hypokinesis and an EF of 45%.His peak troponin was 4.8 and it may have been a Type II MI due to paroxysmal atrial fibrillation with rapid ventricular response.   CATH: 2004 CLINICAL HISTORY:  Mr. Begley is 68 years old and had a previous stent  placed in the mid right coronary artery in 1997.  He has also had stenting  of the left iliac vessel.  He has done well until the last couple of months  when he has developed  exertional chest tightness with almost any kind of  exertion.  He was seen in consultation by Dr. Geraldo Pitter who arranged for  evaluation with coronary angiography.   PROCEDURE:  The procedure was performed via the right femoral artery using  arterial sheath and 6-French preformed coronary catheters.  A front wall  arterial puncture was performed and Omnipaque contrast was used.  After  completion of the diagnostic study made a decision to proceed with  intervention on the right coronary artery and circumflex artery.   The patient was enrolled in the SEPIA trial which randomizes him to either  unfractionated heparin or a 10A  inhibitor.  This is blinded.  We chose to  use Integrilin double bolus and infusion and he was already on Plavix.   We used a 6-French JR4 guiding catheter with side holes and a short PT2  light support wire.  We crossed the lesions in the proximal and mid right  coronary artery with the wire without difficulty.  We pre dilated with a 2.5  x 15 mm Quantum Maverick performing one inflation of 8 atmospheres for 30  seconds at each lesion.  We then stented the distal lesion with a 2.5 x 20  mm Taxus stent deploying this with one inflation of 12 atmospheres for 30  seconds.  We then deployed a 3.0 x 12 mm Taxus at the proximal lesion  deploying this with one inflation of 16 atmospheres for 20 seconds.  We then  post dilated with a 3.25 x 8 mm Quantum Maverick and inflated this with one  inflation up to 16 atmospheres for 20 seconds.   We then approached the circumflex artery.  We used a CLS4 6-French guiding  catheter with side holes and the same PT2 light support wire.  We crossed  the lesion in the distal circumflex artery with the wire without difficulty.  We pre dilated with a 2.5 x 15 mm Quantum Maverick performing one inflation  up to 8 atmospheres for 30 seconds.  We then deployed a 2.5 x 16 mm Taxus  stent deploying this with one inflation of 10 atmospheres for 30 seconds so  as not to over dilate the distal edge.  We then post dilated with a 2.5 x 15  mm Quantum Maverick inflating to 16 atmospheres for 30 seconds and avoiding  the distal edge.  Repeat diagnostic studies were then performed through the  guiding catheter.  The patient tolerated the procedure well and left the  laboratory in satisfactory condition.   RESULTS:  The aortic pressure was 141/81 with mean of 105.  Left ventricular  pressure was 141/5.   Left main coronary artery:  Free of significant disease.   Left anterior descending artery:  Gave rise to a large diagonal branch and  two septal perforators.  The  LAD was irregular with no major obstruction.   Circumflex artery:  Gave rise to a marginal branch and a posterolateral  branch.  There was 80% narrowing in the distal vessel which was segmental.   Right coronary artery:  Moderate sized vessel.  Gave rise to a right  ventricular branch, posterior descending branch, and two posterolateral  branches.  There was 90% narrowing in the proximal vessel.  There was less  than 20% narrowing at the stent in the mid vessel.  There was a segmental  80% narrowing in the mid vessel after the stent.  Distal vessel was free of  major obstruction.   LEFT VENTRICULOGRAM:  The left  ventriculogram performed in the RAO  projection showed good wall motion with no areas of hypokinesis.  The  estimated ejection fraction was 60%.   Following stenting of the lesion in the proximal right coronary artery the  stenosis improved from 90% to 0%.   Following stenting of the lesion in the mid right coronary artery stenosis  improved from 80% to 0%.   Following stenting of the lesion in the distal circumflex artery the  stenosis improved from 80% to 0%.   CONCLUSIONS:  1. Coronary artery disease status post prior stenting in the mid right     coronary artery in 1997 with 90% stenosis in the proximal right coronary     artery, less than 20% narrowing at the stent site in the mid right     coronary artery, 80% narrowing in the mid right coronary artery distal to     the stent, 80% narrowing in the distal circumflex artery, irregularities     in the left anterior descending artery, and normal left ventricular     function.  2. Successful stenting of tandem lesions in the proximal and mid right     coronary artery using Taxus stents with improvement in proximal stenosis     of 90% to 0% and improvement in mid stenosis from 80% to 0%.  3. Successful stenting of the lesion in the distal circumflex artery with a     Taxus stent with improvement in center of  narrowing from 80% to 0%.   DISPOSITION:  The patient will return to postanesthesia care unit for  further observation.  Since we used Taxus stents Plavix will be mandated for  six months and ideally continued for a year.  The patient has been on Plavix  therapy, however.   Laboratory Data:  Chemistry Recent Labs  Lab 05/02/19 0939 05/02/19 0953  NA 135  135 135  K 3.3*  3.0* 3.0*  CL 96*  100 100  CO2 24  --   GLUCOSE 123*  112* 111*  BUN 18  16 16   CREATININE 2.62*  2.40* 2.40*  CALCIUM 7.7*  --   GFRNONAA 24*  --   GFRAA 28*  --   ANIONGAP 15  --     No results for input(s): PROT, ALBUMIN, AST, ALT, ALKPHOS, BILITOT in the last 168 hours. Hematology Recent Labs  Lab 05/02/19 0939 05/02/19 0953  WBC 8.8  --   RBC 3.20*  --   HGB 9.4*  8.8* 9.2*  HCT 29.5*  26.0* 27.0*  MCV 92.2  --   MCH 29.4  --   MCHC 31.9  --   RDW 14.3  --   PLT 305  --    Cardiac Enzymes  High Sensitivity Troponin:   Recent Labs  Lab 05/02/19 0939  TROPONINIHS 21*     BNPNo results for input(s): BNP, PROBNP in the last 168 hours.  DDimer No results for input(s): DDIMER in the last 168 hours. Lipids:  Lab Results  Component Value Date   CHOL 155 12/05/2018   HDL 40 12/05/2018   LDLCALC 97 12/05/2018   TRIG 90 12/05/2018   CHOLHDL 3.9 12/05/2018   INR: No results found for: INR, PROTIME A1c: No results found for: HGBA1C Thyroid:  Lab Results  Component Value Date   TSH 3.740 12/05/2018    Radiology/Studies:  [facility] Chest Port 1 View  Result Date: 05/02/2019 CLINICAL DATA:  Chest pain EXAM: PORTABLE CHEST 1 VIEW COMPARISON:  May 08, 2018 FINDINGS: No edema or consolidation. There is cardiomegaly with pulmonary vascularity normal. No adenopathy. There is aortic atherosclerosis. No bone lesions. No pneumothorax. IMPRESSION: Cardiomegaly. Lungs clear. No adenopathy. Aortic Atherosclerosis (ICD10-I70.0). Electronically Signed   By: Lowella Grip III M.D.   On:  05/02/2019 09:47    Assessment and Plan:   1.  Unstable anginal pain: -He has a mild elevation in his troponin, but has known coronary artery disease and ongoing pain - Cardiac catheterization was discussed with the patient fully. The patient understands that risks include but are not limited to stroke (1 in 1000), death (1 in 77), kidney failure [usually temporary] (1 in 500), bleeding (1 in 200), allergic reaction [possibly serious] (1 in 200).  The patient understands and is willing to proceed.   -Dr. Saunders Revel is the cathing doctor and understands access issues -This may be demand ischemia secondary to his arrhythmia.  2.  Wide-complex tachycardia: -Per Dr. Lovena Le, he is going in and out of an aberrantly conducted wide-complex tachycardia, possibly accelerated idioventricular rhythm up to a rate of 130 (was higher during transport by EMS) -His underlying rhythm may be atrial fibrillation, he has a history of PAF no P waves are seen -The emergency room is correcting his electrolyte abnormalities, follow-up on this. -Continue to monitor on telemetry.  If no changes in his and anatomy are seen, the chest pain may be coming from the tachycardia  3.  Taijah Macrae-stage renal disease on dialysis -Per the patient, he completed about half of his treatment today. -Consult nephrology  4.  Diabetes: -History of poor control with an A1c of 11 in 2017. -Hold home Rx and use sliding scale insulin  5. COVID + -Patient has tested positive for Covid -He has not had any fevers or chills. -He denies cough -He has had general malaise for a couple of days -Discuss with MD if IM consult is needed. -Once we complete cardiology work-up, may need to turn over care if the patient is not ready for discharge.  Otherwise, continue home meds Principal Problem:   Unstable angina ([place]) Active Problems:   PAF (paroxysmal atrial fibrillation) ([facility])   Wide-complex tachycardia ([place])     For questions or updates,  please contact [facility] Please consult [email or web address] for contact info under Cardiology/STEMI.    Signed, Rosaria Ferries, PA-C  05/02/2019 10:59 AM   I have independently seen and examined the patient and agree with the findings and plan, as documented in the PA/NP's note, with the following additions/changes.  Patient 69 year old male with history of CAD and PAD status post multiple coronary and peripheral interventions, PAF on amiodarone, chronic HFpEF, hypertension, hyperlipidemia, and ESRD, presenting with cute onset of chest pain this morning. He is also noted to be hypotensive at hemodialysis. EKG in the ED was concerning for possible accelerated idioventricular rhythm versus VT. Decision was not made to take the patient for emergent catheterization as a STEMI. However, given his history, he has been referred for urgent catheterization. On arrival in the Cath Lab, COVID PCR returned positive. Patient denies fevers and chills. Exam notable for regular rate and rhythm without murmurs. Lungs are clear anteriorly. Healed incision is noted over the right femoral artery. Femoral pulses are 2+ bilaterally.  Labs notable for initial troponin of 21 (high-sensitivity), potassium 3.0, and hemoglobin 9.2. This patient has already been referred for left heart catheterization in setting of possible unstable angina and ventricular tachycardia, we will proceed with this procedure despite new finding of Covid infection.  I have reviewed the risks, indications, and alternatives to cardiac catheterization, possible angioplasty, and stenting with the patient. Risks include but are not limited to bleeding, infection, vascular injury, stroke, myocardial infection, arrhythmia, kidney injury, radiation-related injury in the case of prolonged fluoroscopy use, emergency cardiac surgery, and death. The patient understands the risks of serious complication is 1-2 in [id number] with diagnostic cardiac cath and 1-2% or less with  angioplasty/stenting. Continue secondary prevention of CAD. Will consult medicine versus CCM for assistance with management of Covid following catheterization.  Nelva Bush, MD [facility]

## 2019-05-02 NOTE — ED Notes (Signed)
This EMT ran I-Stat Chem 8 twice due to 1st test not crossing over into epic. POC called and resolved issue.

## 2019-05-02 NOTE — ED Triage Notes (Signed)
Pt coming from dialysis center for chest pain and weakness that started this morning at 6am. Pt received 2hrs of dialysis. Bp low en route. Pt arrives alert and oriented, pain 2/10 at this time

## 2019-05-02 NOTE — ED Provider Notes (Signed)
[facility] EMERGENCY DEPARTMENT Provider Note   CSN: [id number] Arrival date & time: 05/02/19  0915     History Chief Complaint  Patient presents with  . Chest Pain    James Chan is a 69 y.o. male.  He has a history of coronary disease and peripheral vascular disease along with end-stage renal.  He has had on and off chest pain for a few weeks.  He said he started with chest pain today at 6 AM and it worsened during dialysis.  He received about 2 hours of dialysis.  Chest pain was 8 out of 10 at its worst currently 2 out of 10.  It radiated to his back and jaw and was associated with shortness of breath and nausea.  He thinks his last cardiac intervention was 10 years ago.  The history is provided by the patient and the EMS personnel.  Chest Pain Pain location:  Substernal area Pain quality: aching   Pain radiates to:  Neck and upper back Pain severity now: 2-8/10. Onset quality:  Gradual Timing:  Intermittent Progression:  Partially resolved Chronicity:  Recurrent Context comment:  Dialysis Relieved by:  None tried Worsened by:  Nothing Ineffective treatments:  None tried Associated symptoms: back pain, fatigue, nausea and shortness of breath   Associated symptoms: no abdominal pain, no cough, no fever and no headache   Risk factors: coronary artery disease, diabetes mellitus, high cholesterol, hypertension and male sex        Past Medical History:  Diagnosis Date  . Anemia   . Anxiety state, unspecified 09/15/2008   Qualifier: Diagnosis of  By: Bullins CMA, Ami  , patient denies  . Arterial insufficiency of lower extremity ([name]) 05/10/2016  . Arthritis    elbows  . BACK PAIN, LUMBAR, CHRONIC 09/15/2008   Qualifier: Diagnosis of  By: Bullins CMA, Ami    . CHF (congestive heart failure) ([place])   . Claudication ([place]) 10/30/2014  . Comprehensive diabetic foot examination, type 2 DM, encounter for [facility]) 09/15/2008   Qualifier: Diagnosis of  By: Bullins  CMA, Ami    . DEPRESSIVE DISORDER 09/15/2008   Qualifier: Diagnosis of  By: Bullins CMA, Ami    . Diabetes mellitus without complication ([place])   . Diabetic polyneuropathy associated with diabetes mellitus due to underlying condition ([place]) 06/28/2015  . Dyslipidemia 11/24/2015  . Dyspnea    with exertion  . End stage renal disease on dialysis ([place])    M/W/F [facility]  . Esophageal reflux 09/15/2008   Qualifier: Diagnosis of  By: Bullins CMA, Ami    . Glaucoma   . Gout   . Hammer toes of both feet 06/28/2015  . History of blood transfusion   . History of carotid artery stenosis 10/30/2014  . 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 Hyperlipidemia   . Hypertension   . Hypertensive heart failure ([place]) 11/28/2016  . HYPERTRIGLYCERIDEMIA 09/15/2008   Qualifier: Diagnosis of  By: Bullins CMA, Ami    . Myocardial infarction ([place]) 1997  . NSTEMI (non-ST elevated myocardial infarction) ([place]) 09/15/2008   Qualifier: Diagnosis of  By: Bullins CMA, Ami    . Onychomycosis due to dermatophyte 06/28/2015  . PAD (peripheral artery disease) ([place]) 10/30/2014  . Sleep apnea    no CPAP  . Stroke [facility])    no residual    Patient Active Problem List   Diagnosis Date Noted  . CKD (chronic kidney disease) stage V requiring chronic dialysis ([place]) 10/07/2018  .  Mitral regurgitation 02/05/2018  . Bradycardia 07/02/2017  . Iron deficiency anemia 07/02/2017  . CKD (chronic kidney disease) 04/11/2017  . PAF (paroxysmal atrial fibrillation) ([place]) 02/27/2017  . Chronic combined systolic and diastolic heart failure ([place]) 11/28/2016  . Hypertensive heart disease 11/28/2016  . On amiodarone therapy 11/28/2016  . Chronic anticoagulation 11/28/2016  . Erectile dysfunction 11/28/2016  . Arterial insufficiency of lower extremity ([place]) 05/10/2016  . Coronary artery disease involving native coronary artery of native heart without angina pectoris 11/24/2015  . Dyslipidemia 11/24/2015  . Diabetic  polyneuropathy associated with diabetes mellitus due to underlying condition ([place]) 06/28/2015  . Hammer toes of both feet 06/28/2015  . Onychomycosis due to dermatophyte 06/28/2015  . Type 2 diabetes, controlled, with peripheral circulatory disorder ([place]) 06/28/2015  . Claudication ([place]) 10/30/2014  . History of carotid artery stenosis 10/30/2014  . 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 PAD (peripheral artery disease) ([place]) 10/30/2014  . Comprehensive diabetic foot examination, type 2 DM, encounter for ([place]) 09/15/2008  . HYPERTRIGLYCERIDEMIA 09/15/2008  . ANXIETY STATE, UNSPECIFIED 09/15/2008  . DEPRESSIVE DISORDER 09/15/2008  . NSTEMI (non-ST elevated myocardial infarction) ([place]) 09/15/2008  . ESOPHAGEAL REFLUX 09/15/2008  . BACK PAIN, LUMBAR, CHRONIC 09/15/2008    Past Surgical History:  Procedure Laterality Date  . angioplasty of iliofemoral and iliac artery with stent placement    . AV FISTULA PLACEMENT Right 07/04/2018   Procedure: CREATION BASILIC VEIN ARTERIOVENOUS FISTULA RIGHT ARM;  Surgeon: [name], MD;  Location: [facility];  Service: Vascular;  Laterality: Right;  . BASCILIC VEIN TRANSPOSITION Right 10/10/2018   Procedure: BASILIC VEIN TRANSPOSITION SECOND STAGE Right upper arm;  Surgeon: [name], MD;  Location: [facility];  Service: Vascular;  Laterality: Right;  . CARDIAC CATHETERIZATION    . CAROTID STENT Bilateral   . CATARACT EXTRACTION W/ INTRAOCULAR LENS  IMPLANT, BILATERAL    . COLONOSCOPY W/ POLYPECTOMY    . CORONARY ANGIOPLASTY     stents x 5  . INSERTION OF DIALYSIS CATHETER    . KNEE SURGERY     right/ left worked on ligaments and tendons  . OTHER SURGICAL HISTORY     reports 32 stents to include being in eye       Family History  Problem Relation Age of Onset  . Heart disease Mother   . Cancer Mother   . Heart disease Father   . Cancer Father     Social History   Tobacco Use  . Smoking status: Former Smoker      Years: 36.00    Quit date: 05/10/1995    Years since quitting: 23.9  . Smokeless tobacco: Never Used  Substance Use Topics  . Alcohol use: No  . Drug use: No    Home Medications Prior to Admission medications   Medication Sig Start Date End Date Taking? Authorizing Provider  allopurinol (ZYLOPRIM) 100 MG tablet Take 100 mg by mouth at bedtime.     [provider]  amiodarone (PACERONE) 200 MG tablet TAKE 1 TABLET BY MOUTH  DAILY 12/26/18   Richardo Priest, MD  atorvastatin (LIPITOR) 40 MG tablet Take 1 tablet (40 mg total) by mouth every evening. 03/04/19   Richardo Priest, MD  cloNIDine (CATAPRES) 0.1 MG tablet Take 0.1mg  (one tablet) on non-hemodialysis days if systolic blood pressure is >180.    [provider]  clopidogrel (PLAVIX) 75 MG tablet Take 75 mg by mouth daily with supper.  [provider]  hydrALAZINE (APRESOLINE) 25 MG tablet Take 25 mg by mouth as directed. Take 25mg  BID on Mon, Wed, and Fri.  Take 50mg  TID on Tues, Thurs, Sat, and Sun    [provider]  insulin degludec (TRESIBA FLEXTOUCH) 100 UNIT/ML SOPN FlexTouch Pen Inject 15-30 Units into the skin See admin instructions. Inject 15-30 units twice daily    [provider]  latanoprost (XALATAN) 0.005 % ophthalmic solution Place 1 drop into both eyes at bedtime. 06/16/14   [provider]  metoprolol tartrate (LOPRESSOR) 25 MG tablet Take 25 mg by mouth daily.  05/23/18   [provider]  nitroGLYCERIN (NITROSTAT) 0.4 MG SL tablet Place 1 tablet (0.4 mg total) under the tongue every 5 (five) minutes as needed for chest pain. 06/08/17   Richardo Priest, MD  oxyCODONE-acetaminophen (PERCOCET) 5-325 MG tablet Take 1 tablet by mouth every 4 (four) hours as needed for severe pain. 10/10/18 10/10/19  [name], MD  polyethylene glycol (MIRALAX / Floria Raveling) packet Take 17 g by mouth daily as needed for mild constipation.    [provider]  PROAIR HFA  108 [phone] Base) MCG/ACT inhaler Inhale 2 puffs into the lungs every 6 (six) hours as needed for wheezing or shortness of breath.  10/01/17   [provider]    Allergies    Patient has no known allergies.  Review of Systems   Review of Systems  Constitutional: Positive for fatigue. Negative for fever.  HENT: Negative for sore throat.   Eyes: Negative for visual disturbance.  Respiratory: Positive for shortness of breath. Negative for cough.   Cardiovascular: Positive for chest pain.  Gastrointestinal: Positive for nausea. Negative for abdominal pain.  Genitourinary: Negative for dysuria.  Musculoskeletal: Positive for back pain.  Skin: Negative for rash.  Neurological: Negative for headaches.    Physical Exam Updated Vital Signs BP (!) 147/61   Pulse 64   Temp (!) 97.3 F (36.3 C) (Oral)   Resp 16   Ht 6' (1.829 m)   Wt 86.2 kg   SpO2 100%   BMI 25.77 kg/m   Physical Exam Vitals and nursing note reviewed.  Constitutional:      Appearance: He is well-developed.  HENT:     Head: Normocephalic and atraumatic.  Eyes:     Conjunctiva/sclera: Conjunctivae normal.  Cardiovascular:     Rate and Rhythm: Regular rhythm. Tachycardia present.     Heart sounds: No murmur.  Pulmonary:     Effort: Pulmonary effort is normal. No respiratory distress.     Breath sounds: Normal breath sounds.  Abdominal:     Palpations: Abdomen is soft.     Tenderness: There is no abdominal tenderness.  Musculoskeletal:     Cervical back: Neck supple.     Comments: Fistula right upper arm with positive thrill  Skin:    General: Skin is warm and dry.     Capillary Refill: Capillary refill takes less than 2 seconds.  Neurological:     General: No focal deficit present.     Mental Status: He is alert.     ED Results / Procedures / Treatments   Labs (all labs ordered are listed, but only abnormal results are displayed) Labs Reviewed  RESPIRATORY PANEL BY RT PCR (FLU A&B, COVID) -  Abnormal; Notable for the following components:      Result Value   SARS Coronavirus 2 by RT PCR POSITIVE (*)    All other components  within normal limits  BASIC METABOLIC PANEL - Abnormal; Notable for the following components:   Potassium 3.3 (*)    Chloride 96 (*)    Glucose, Bld 123 (*)    Creatinine, Ser 2.62 (*)    Calcium 7.7 (*)    GFR calc non Af Amer 24 (*)    GFR calc Af Amer 28 (*)    All other components within normal limits  CBC - Abnormal; Notable for the following components:   RBC 3.20 (*)    Hemoglobin 9.4 (*)    HCT 29.5 (*)    All other components within normal limits  I-STAT CHEM 8, ED - Abnormal; Notable for the following components:   Potassium 3.0 (*)    Creatinine, Ser 2.40 (*)    Glucose, Bld 112 (*)    Calcium, Ion 0.83 (*)    Hemoglobin 8.8 (*)    HCT 26.0 (*)    All other components within normal limits  I-STAT CHEM 8, ED - Abnormal; Notable for the following components:   Potassium 3.0 (*)    Creatinine, Ser 2.40 (*)    Glucose, Bld 111 (*)    Calcium, Ion 0.83 (*)    Hemoglobin 9.2 (*)    HCT 27.0 (*)    All other components within normal limits  TROPONIN I (HIGH SENSITIVITY) - Abnormal; Notable for the following components:   Troponin I (High Sensitivity) 21 (*)    All other components within normal limits  MRSA PCR SCREENING  MAGNESIUM  GLUCOSE, CAPILLARY  HIV ANTIBODY (ROUTINE TESTING W REFLEX)  HEMOGLOBIN A1C  HEMOGLOBIN V7B  BASIC METABOLIC PANEL  PROCALCITONIN  LACTATE DEHYDROGENASE  HEPATITIS B SURFACE ANTIGEN  FERRITIN  FIBRINOGEN  D-DIMER, QUANTITATIVE (NOT AT [facility])  C-REACTIVE PROTEIN  BRAIN NATRIURETIC PEPTIDE  COMPREHENSIVE METABOLIC PANEL  LIPID PANEL  CBC  PHOSPHORUS  MAGNESIUM  FERRITIN  D-DIMER, QUANTITATIVE (NOT AT [facility])  C-REACTIVE PROTEIN  POCT ACTIVATED CLOTTING TIME  TROPONIN I (HIGH SENSITIVITY)  TROPONIN I (HIGH SENSITIVITY)  TROPONIN I (HIGH SENSITIVITY)    EKG EKG  Interpretation  Date/Time:  Friday May 02 2019 09:21:10 EST Ventricular Rate:  130 PR Interval:    QRS Duration: 141 QT Interval:  393 QTC Calculation: 574 R Axis:   97 Text Interpretation: Sinus tachycardia Nonspecific intraventricular conduction delay Consider anterior infarct Repol abnrm, prob ischemia, anterolateral lds new wide complex tachycardia Confirmed by Aletta Edouard [phone]) on 05/02/2019 9:40:17 AM   Radiology CARDIAC CATHETERIZATION  Result Date: 05/02/2019 Conclusions: 1. Multivessel coronary artery disease including 50-60% distal LMCA stenosis, 30% proximal LAD and 50% ostial D1 lesions, and chronic total occlusions of distal LCx and long stented segment of the proximal through distal RCA.  Occlusions of the LCx and RCA are known to be chronic based on prior outside catheterization reports. 2. Severely reduced left ventricular systolic function (LVEF ~00%) with mildly elevated filling pressure. Recommendations: 1. Medical therapy of worsening cardiomyopathy complicated by wide-complex tachycardia.  Recommend formal consultation with EP. 2. Medical therapy of left main disease and LCx/RCA chronic total occlusions in the setting of acute comorbidities.  When patient has been optimized from a heart failure standpoint and COVID-19 resolved, revascularization options may need to be readdressed. 3. Optimize evidence-based heart failure therapy. 4. Consultation with internal medicine and/or critical care medicine to assist with management of COVID-19 infection. Nelva Bush, MD [facility]   DG Chest Port 1 View  Result Date: 05/02/2019 CLINICAL DATA:  Chest pain EXAM: PORTABLE  CHEST 1 VIEW COMPARISON:  May 08, 2018 FINDINGS: No edema or consolidation. There is cardiomegaly with pulmonary vascularity normal. No adenopathy. There is aortic atherosclerosis. No bone lesions. No pneumothorax. IMPRESSION: Cardiomegaly. Lungs clear. No adenopathy. Aortic Atherosclerosis  (ICD10-I70.0). Electronically Signed   By: Lowella Grip III M.D.   On: 05/02/2019 09:47    Procedures .Critical Care Performed by: Hayden Rasmussen, MD Authorized by: Hayden Rasmussen, MD   Critical care provider statement:    Critical care time (minutes):  45   Critical care time was exclusive of:  Separately billable procedures and treating other patients   Critical care was necessary to treat or prevent imminent or life-threatening deterioration of the following conditions:  Cardiac failure   Critical care was time spent personally by me on the following activities:  Discussions with consultants, evaluation of patient's response to treatment, examination of patient, ordering and performing treatments and interventions, ordering and review of laboratory studies, ordering and review of radiographic studies, pulse oximetry, re-evaluation of patient's condition, obtaining history from patient or surrogate, review of old charts and development of treatment plan with patient or surrogate   I assumed direction of critical care for this patient from another provider in my specialty: no     (including critical care time)  Medications Ordered in ED Medications  nitroGLYCERIN (NITROSTAT) SL tablet 0.4 mg (has no administration in time range)  acetaminophen (TYLENOL) tablet 650 mg (has no administration in time range)  ondansetron (ZOFRAN) injection 4 mg (has no administration in time range)  zolpidem (AMBIEN) tablet 5 mg (has no administration in time range)  sodium chloride flush (NS) 0.9 % injection 3 mL (3 mLs Intravenous Given 05/02/19 1521)  sodium chloride flush (NS) 0.9 % injection 3 mL (has no administration in time range)  0.9 %  sodium chloride infusion (has no administration in time range)  ALPRAZolam (XANAX) tablet 0.25 mg (has no administration in time range)  allopurinol (ZYLOPRIM) tablet 100 mg (has no administration in time range)  atorvastatin (LIPITOR) tablet 40 mg (40 mg  Oral Given 05/02/19 1714)  clopidogrel (PLAVIX) tablet 75 mg (75 mg Oral Given 05/02/19 1714)  latanoprost (XALATAN) 0.005 % ophthalmic solution 1 drop (has no administration in time range)  metoprolol tartrate (LOPRESSOR) tablet 25 mg (has no administration in time range)  oxyCODONE-acetaminophen (PERCOCET/ROXICET) 5-325 MG per tablet 1 tablet (has no administration in time range)  polyethylene glycol (MIRALAX / GLYCOLAX) packet 17 g (has no administration in time range)  insulin aspart (novoLOG) injection 0-15 Units (0 Units Subcutaneous Not Given 05/02/19 1645)  labetalol (NORMODYNE) injection 10 mg (has no administration in time range)  hydrALAZINE (APRESOLINE) injection 10 mg (has no administration in time range)  heparin injection 5,000 Units (has no administration in time range)  sodium chloride flush (NS) 0.9 % injection 3 mL (3 mLs Intravenous Not Given 05/02/19 1521)  sodium chloride flush (NS) 0.9 % injection 3 mL (has no administration in time range)  0.9 %  sodium chloride infusion (has no administration in time range)  aspirin chewable tablet 81 mg (has no administration in time range)  amiodarone (NEXTERONE PREMIX) 360-4.14 MG/200ML-% (1.8 mg/mL) IV infusion (60 mg/hr Intravenous New Bag/Given (Non-Interop) 05/02/19 1508)  amiodarone (NEXTERONE PREMIX) 360-4.14 MG/200ML-% (1.8 mg/mL) IV infusion (has no administration in time range)  insulin glargine (LANTUS) injection 15 Units (has no administration in time range)  calcitRIOL (ROCALTROL) capsule 0.5 mcg (has no administration in time range)  albuterol (VENTOLIN HFA) 108 (  90 Base) MCG/ACT inhaler 2 puff (has no administration in time range)  guaiFENesin-dextromethorphan (ROBITUSSIN DM) 100-10 MG/5ML syrup 10 mL (has no administration in time range)  chlorpheniramine-HYDROcodone (TUSSIONEX) 10-8 MG/5ML suspension 5 mL (has no administration in time range)  ascorbic acid (VITAMIN C) tablet 500 mg (500 mg Oral Given 05/02/19 1714)  zinc sulfate  capsule 220 mg (220 mg Oral Given 05/02/19 1714)  potassium chloride SA (KLOR-CON) CR tablet 40 mEq (40 mEq Oral Given 05/02/19 1017)  calcium gluconate 1 g/ 50 mL sodium chloride IVPB (0 mg Intravenous Stopped 05/02/19 1400)  ondansetron (ZOFRAN) injection 4 mg (4 mg Intravenous Given 05/02/19 1010)  aspirin chewable tablet 324 mg (324 mg Oral Given 05/02/19 1031)  heparin injection 4,000 Units (4,000 Units Intravenous Given 05/02/19 1031)    ED Course  I have reviewed the triage vital signs and the nursing notes.  Pertinent labs & imaging results that were available during my care of the patient were reviewed by me and considered in my medical decision making (see chart for details).  Clinical Course as of May 01 1717  Fri May 02, 2019  6759 Prehospital EKG was sent to cardiology and they are present on patient's arrival.  Patient evaluated by Dr. Claiborne Billings interventional attending who does not feel this is an acute STEMI.  Cardiac work-up ongoing.   [MB]  4067 68 year old male known cardiac and vascular disease here with chest pain and hypotension.  History of A. fib.  Differential includes ACS, arrhythmia, metabolic derangement, PE, vascular disease.   [MB]  1638 Chest x-ray interpreted by me as cardiomegaly no pneumothorax no infiltrate.   [MB]  1013 I-STAT labs showing a low potassium of 3.0 and a low calcium ionized of 0.83.  Have ordered supplementation of that and added on a magnesium level.  Hemoglobin low at 9.2 but in comparison to priors this is baseline.   [MB]  14 Cardiology now asking for the patient to get aspirin and 4000 of heparin IV and they are taken to the Cath Lab.   [MB]  4665 Cardiology also reviewed the case including the EKG with EP attending.  Their opinion was that it was likely more a idioventricular conduction delay in the setting of probable electrolyte abnormalities.   [MB]  1045 Calcium(!): 7.7 [MB]  1045 Potassium(!): 3.3 [MB]    Clinical Course User Index [MB]  Hayden Rasmussen, MD   MDM Rules/Calculators/A&P                     James Chan was evaluated in Emergency Department on 05/02/2019 for the symptoms described in the history of present illness. He was evaluated in the context of the global COVID-19 pandemic, which necessitated consideration that the patient might be at risk for infection with the SARS-CoV-2 virus that causes COVID-19. Institutional protocols and algorithms that pertain to the evaluation of patients at risk for COVID-19 are in a state of rapid change based on information released by regulatory bodies including the CDC and federal and state organizations. These policies and algorithms were followed during the patient's care in the ED.   Final Clinical Impression(s) / ED Diagnoses Final diagnoses:  ACS (acute coronary syndrome) ([place])  Hypotension, unspecified hypotension type  Hypokalemia  Hypocalcemia    Rx / DC Orders ED Discharge Orders    None       Hayden Rasmussen, MD 05/02/19 1722

## 2019-05-02 NOTE — Progress Notes (Signed)
Renal Navigator notes positive COVID 19 test result. Patient has now been switched from home OP HD clinic [place] to [facility] isolation shift and will treat on a TTS schedule with a seat time of 12:00pm at discharge.   Alphonzo Cruise, [place] Renal Navigator [phone]

## 2019-05-02 NOTE — Plan of Care (Signed)
  Problem: Education: Goal: Knowledge of General Education information will improve Description: Including pain rating scale, medication(s)/side effects and non-pharmacologic comfort measures Outcome: Progressing   Problem: Health Behavior/Discharge Planning: Goal: Ability to manage health-related needs will improve Outcome: Progressing   Problem: Clinical Measurements: Goal: Ability to maintain clinical measurements within normal limits will improve Outcome: Progressing Goal: Will remain free from infection Outcome: Progressing Goal: Diagnostic test results will improve Outcome: Progressing Goal: Respiratory complications will improve Outcome: Progressing Goal: Cardiovascular complication will be avoided Outcome: Progressing   Problem: Activity: Goal: Risk for activity intolerance will decrease Outcome: Progressing   Problem: Nutrition: Goal: Adequate nutrition will be maintained Outcome: Progressing   Problem: Coping: Goal: Level of anxiety will decrease Outcome: Progressing   Problem: Elimination: Goal: Will not experience complications related to bowel motility Outcome: Progressing Goal: Will not experience complications related to urinary retention Outcome: Progressing   Problem: Pain Managment: Goal: General experience of comfort will improve Outcome: Progressing   Problem: Safety: Goal: Ability to remain free from injury will improve Outcome: Progressing   Problem: Skin Integrity: Goal: Risk for impaired skin integrity will decrease Outcome: Progressing   Problem: Education: Goal: Understanding of CV disease, CV risk reduction, and recovery process will improve Outcome: Progressing Goal: Individualized Educational Video(s) Outcome: Progressing   Problem: Activity: Goal: Ability to return to baseline activity level will improve Outcome: Progressing   Problem: Cardiovascular: Goal: Ability to achieve and maintain adequate cardiovascular perfusion  will improve Outcome: Progressing Goal: Vascular access site(s) Level 0-1 will be maintained Outcome: Progressing   Problem: Health Behavior/Discharge Planning: Goal: Ability to safely manage health-related needs after discharge will improve Outcome: Progressing   Problem: Education: Goal: Knowledge of risk factors and measures for prevention of condition will improve Outcome: Progressing   Problem: Coping: Goal: Psychosocial and spiritual needs will be supported Outcome: Progressing   Problem: Respiratory: Goal: Will maintain a patent airway Outcome: Progressing Goal: Complications related to the disease process, condition or treatment will be avoided or minimized Outcome: Progressing

## 2019-05-02 NOTE — ED Notes (Signed)
Iv team at bedside  

## 2019-05-02 NOTE — Plan of Care (Signed)
Outpatient dialysis orders:  MWF at [facility] 4hr, 400/800, EDW 87.3kg, 2K/2.25Ca, UF profile 2, AVF, heparin 2000 bolus - Calcitriol 0.48mcg PO q HD - No current ESA orders - on hold, last given Mircera 39mcg on 04/09/19  Last HD: 1/8 - approx 2 hours of treatment prior to EMS being called for CP, arrhythmia, hypotension 1/6 - left below EDW at 86.7kg

## 2019-05-02 NOTE — Consult Note (Signed)
Cardiology Consultation:   Patient ID: James Chan MRN: [id number]; DOB: [birth year]  Admit date: 05/02/2019 Date of Consult: 05/02/2019  Primary Care Provider: Cher Nakai, MD Primary Cardiologist: James More, MD  Primary Electrophysiologist:  None new   Patient Profile:   James Chan is a 69 y.o. male with a hx of PAF, CAD, chronic systolic heart failure who is being seen today for the evaluation of a wide QRS tachycardia at the request of James Chan.  History of Present Illness:   James Chan has a h/o ischemic heart disease and noted to have sob and constipation last week. The day before admit he developed chest pressure. He is on chronic HD. He was in HD today when he experienced Chan chest pressure and is admitted for evaluation. In the ED he was noted to have a wide and wider QRS and is admitted for eval. He is Covid positive. He denies chest pain or sob. No fever or chills.   Heart Pathway Score:     Past Medical History:  Diagnosis Date  . Anemia   . Anxiety state, unspecified 09/15/2008   Qualifier: Diagnosis of  By: Bullins CMA, Ami  , patient denies  . Arterial insufficiency of lower extremity ([place]) 05/10/2016  . Arthritis    elbows  . BACK PAIN, LUMBAR, CHRONIC 09/15/2008   Qualifier: Diagnosis of  By: Bullins CMA, Ami    . CHF (congestive heart failure) ([place])   . Claudication ([place]) 10/30/2014  . Comprehensive diabetic foot examination, type 2 DM, encounter for [facility]) 09/15/2008   Qualifier: Diagnosis of  By: Bullins CMA, Ami    . DEPRESSIVE DISORDER 09/15/2008   Qualifier: Diagnosis of  By: Bullins CMA, Ami    . Diabetes mellitus without complication ([place])   . Diabetic polyneuropathy associated with diabetes mellitus due to underlying condition ([place]) 06/28/2015  . Dyslipidemia 11/24/2015  . Dyspnea    with exertion  . End stage renal disease on dialysis ([place])    M/W/F [place]  . Esophageal reflux 09/15/2008   Qualifier: Diagnosis of  By: Bullins CMA, Ami    .  Glaucoma   . Gout   . Hammer toes of both feet 06/28/2015  . History of blood transfusion   . History of carotid artery stenosis 10/30/2014  . 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 Hyperlipidemia   . Hypertension   . Hypertensive heart failure ([place]) 11/28/2016  . HYPERTRIGLYCERIDEMIA 09/15/2008   Qualifier: Diagnosis of  By: Bullins CMA, Ami    . Myocardial infarction ([place]) 1997  . NSTEMI (non-ST elevated myocardial infarction) ([place]) 09/15/2008   Qualifier: Diagnosis of  By: Bullins CMA, Ami    . Onychomycosis due to dermatophyte 06/28/2015  . PAD (peripheral artery disease) ([place]) 10/30/2014  . Sleep apnea    no CPAP  . Stroke [facility])    no residual    Past Surgical History:  Procedure Laterality Date  . angioplasty of iliofemoral and iliac artery with stent placement    . AV FISTULA PLACEMENT Right 07/04/2018   Procedure: CREATION BASILIC VEIN ARTERIOVENOUS FISTULA RIGHT ARM;  Surgeon: James Mitchell, MD;  Location: [place];  Service: Vascular;  Laterality: Right;  . BASCILIC VEIN TRANSPOSITION Right 10/10/2018   Procedure: BASILIC VEIN TRANSPOSITION SECOND STAGE Right upper arm;  Surgeon: James Mitchell, MD;  Location: [place];  Service: Vascular;  Laterality: Right;  . CARDIAC CATHETERIZATION    . CAROTID STENT Bilateral   . CATARACT EXTRACTION W/  INTRAOCULAR LENS  IMPLANT, BILATERAL    . COLONOSCOPY W/ POLYPECTOMY    . CORONARY ANGIOPLASTY     stents x 5  . INSERTION OF DIALYSIS CATHETER    . KNEE SURGERY     right/ left worked on ligaments and tendons  . LEFT HEART CATH AND CORONARY ANGIOGRAPHY N/A 05/02/2019   Procedure: LEFT HEART CATH AND CORONARY ANGIOGRAPHY;  Surgeon: James Bush, MD;  Location: [place] CV LAB;  Service: Cardiovascular;  Laterality: N/A;  . OTHER SURGICAL HISTORY     reports 32 stents to include being in eye     Home Medications:  Prior to Admission medications   Medication Sig Start Date End Date Taking? Authorizing Provider    allopurinol (ZYLOPRIM) 100 MG tablet Take 100 mg by mouth at bedtime.     [provider]  amiodarone (PACERONE) 200 MG tablet TAKE 1 TABLET BY MOUTH  DAILY 12/26/18   James Priest, MD  atorvastatin (LIPITOR) 40 MG tablet Take 1 tablet (40 mg total) by mouth every evening. 03/04/19   James Priest, MD  cloNIDine (CATAPRES) 0.1 MG tablet Take 0.1mg  (one tablet) on non-hemodialysis days if systolic blood pressure is >180.    [provider]  clopidogrel (PLAVIX) 75 MG tablet Take 75 mg by mouth daily with supper.     [provider]  hydrALAZINE (APRESOLINE) 25 MG tablet Take 25 mg by mouth as directed. Take 25mg  BID on Mon, Wed, and Fri.  Take 50mg  TID on Tues, Thurs, Sat, and Sun    [provider]  insulin degludec (TRESIBA FLEXTOUCH) 100 UNIT/ML SOPN FlexTouch Pen Inject 15-30 Units into the skin See admin instructions. Inject 15-30 units twice daily    [provider]  latanoprost (XALATAN) 0.005 % ophthalmic solution Place 1 drop into both eyes at bedtime. 06/16/14   [provider]  metoprolol tartrate (LOPRESSOR) 25 MG tablet Take 25 mg by mouth daily.  05/23/18   [provider]  nitroGLYCERIN (NITROSTAT) 0.4 MG SL tablet Place 1 tablet (0.4 mg total) under the tongue every 5 (five) minutes as needed for chest pain. 06/08/17   James Priest, MD  oxyCODONE-acetaminophen (PERCOCET) 5-325 MG tablet Take 1 tablet by mouth every 4 (four) hours as needed for severe pain. 10/10/18 10/10/19  James Mitchell, MD  polyethylene glycol (MIRALAX / Floria Raveling) packet Take 17 g by mouth daily as needed for mild constipation.    [provider]  PROAIR HFA 108 [phone] Base) MCG/ACT inhaler Inhale 2 puffs into the lungs every 6 (six) hours as needed for wheezing or shortness of breath.  10/01/17   [provider]    Inpatient Medications: Scheduled Meds: . allopurinol  100 mg Oral QHS  . amiodarone  200 mg Oral Daily  . [START ON  05/03/2019] aspirin  81 mg Oral Daily  . atorvastatin  40 mg Oral QPM  . clopidogrel  75 mg Oral Q supper  . heparin  5,000 Units Subcutaneous Q8H  . insulin aspart  0-15 Units Subcutaneous TID WC  . insulin degludec  15 Units Subcutaneous BID AC  . latanoprost  1 drop Both Eyes QHS  . metoprolol tartrate  25 mg Oral Daily  . sodium chloride flush  3 mL Intravenous Q12H  . sodium chloride flush  3 mL Intravenous Q12H   Continuous Infusions: . sodium chloride    . sodium chloride     PRN Meds: sodium chloride, sodium chloride, acetaminophen, albuterol,  ALPRAZolam, hydrALAZINE, labetalol, nitroGLYCERIN, ondansetron (ZOFRAN) IV, oxyCODONE-acetaminophen, polyethylene glycol, sodium chloride flush, sodium chloride flush, zolpidem  Allergies:   No Known Allergies  Social History:   Social History   Socioeconomic History  . Marital status: Married    Spouse name: Not on file  . Number of children: Not on file  . Years of education: Not on file  . Highest education level: Not on file  Occupational History  . Not on file  Tobacco Use  . Smoking status: Former Smoker    Years: 36.00    Quit date: 05/10/1995    Years since quitting: 23.9  . Smokeless tobacco: Never Used  Substance and Sexual Activity  . Alcohol use: No  . Drug use: No  . Sexual activity: Not on file  Other Topics Concern  . Not on file  Social History Narrative  . Not on file   Social Determinants of Health   Financial Resource Strain:   . Difficulty of Paying Living Expenses: Not on file  Food Insecurity:   . Worried About Charity fundraiser in the Last Year: Not on file  . Ran Out of Food in the Last Year: Not on file  Transportation Needs:   . Lack of Transportation (Medical): Not on file  . Lack of Transportation (Non-Medical): Not on file  Physical Activity:   . Days of Exercise per Week: Not on file  . Minutes of Exercise per Session: Not on file  Stress:   . Feeling of Stress : Not on file    Social Connections:   . Frequency of Communication with Friends and Family: Not on file  . Frequency of Social Gatherings with Friends and Family: Not on file  . Attends Religious Services: Not on file  . Active Member of Clubs or Organizations: Not on file  . Attends Archivist Meetings: Not on file  . Marital Status: Not on file  Intimate Partner Violence:   . Fear of Current or Ex-Partner: Not on file  . Emotionally Abused: Not on file  . Physically Abused: Not on file  . Sexually Abused: Not on file    Family History:    Family History  Problem Relation Age of Onset  . Heart disease Mother   . Cancer Mother   . Heart disease Father   . Cancer Father      ROS:  Please see the history of present illness.  none All other ROS reviewed and negative.     Physical Exam/Data:   Vitals:   05/02/19 1232 05/02/19 1237 05/02/19 1400 05/02/19 1415  BP:   (!) 149/60   Pulse: (!) 0 (!) 0 64   Resp:   15 14  Temp:   (!) 97.3 F (36.3 C)   TempSrc:   Oral   SpO2:   100% 100%  Weight:      Height:       No intake or output data in the 24 hours ending 05/02/19 1421 Last 3 Weights 05/02/2019 03/04/2019 12/05/2018  Weight (lbs) 190 lb 203 lb 12.8 oz 192 lb 12.8 oz  Weight (kg) 86.183 kg 92.443 kg 87.454 kg     Body mass index is 25.77 kg/m.  General:  Well nourished, well developed, in no acute distress HEENT: normal Lymph: no adenopathy Neck: no JVD Endocrine:  No thryomegaly Cardiac:  normal S1, S2; RRR; no murmur  Lungs:  No increased work of breathing Abd: soft, nontender, no hepatomegaly  Ext: no  edema Musculoskeletal:  No deformities, BUE and BLE strength normal and equal Skin: warm and dry  Neuro:  CNs 2-12 intact, no focal abnormalities noted Psych:  Normal affect   EKG:  The EKG was personally reviewed and demonstrates:  Atrial fib with a RVR/ abherrent conduction Telemetry:  Telemetry was personally reviewed and demonstrates:  NSR  Relevant CV  Studies: Left heart cath - no progression with an occluded RCA and left CX (distal) chronic and EF of 30%, 50% LM  Laboratory Data:  High Sensitivity Troponin:   Recent Labs  Lab 05/02/19 0939  TROPONINIHS 21*     Chemistry Recent Labs  Lab 05/02/19 0939 05/02/19 0953  NA 135  135 135  K 3.3*  3.0* 3.0*  CL 96*  100 100  CO2 24  --   GLUCOSE 123*  112* 111*  BUN 18  16 16   CREATININE 2.62*  2.40* 2.40*  CALCIUM 7.7*  --   GFRNONAA 24*  --   GFRAA 28*  --   ANIONGAP 15  --     No results for input(s): PROT, ALBUMIN, AST, ALT, ALKPHOS, BILITOT in the last 168 hours. Hematology Recent Labs  Lab 05/02/19 0939 05/02/19 0953  WBC 8.8  --   RBC 3.20*  --   HGB 9.4*  8.8* 9.2*  HCT 29.5*  26.0* 27.0*  MCV 92.2  --   MCH 29.4  --   MCHC 31.9  --   RDW 14.3  --   PLT 305  --    BNPNo results for input(s): BNP, PROBNP in the last 168 hours.  DDimer No results for input(s): DDIMER in the last 168 hours.   Radiology/Studies:  CARDIAC CATHETERIZATION  Result Date: 05/02/2019 Conclusions: 1. Multivessel coronary artery disease including 50-60% distal LMCA stenosis, 30% proximal LAD and 50% ostial D1 lesions, and chronic total occlusions of distal LCx and long stented segment of the proximal through distal RCA.  Occlusions of the LCx and RCA are known to be chronic based on prior outside catheterization reports. 2. Severely reduced left ventricular systolic function (LVEF ~16%) with mildly elevated filling pressure. Recommendations: 1. Medical therapy of worsening cardiomyopathy complicated by wide-complex tachycardia.  Recommend formal consultation with EP. 2. Medical therapy of left main disease and LCx/RCA chronic total occlusions in the setting of acute comorbidities.  When patient has been optimized from a heart failure standpoint and COVID-19 resolved, revascularization options may need to be readdressed. 3. Optimize evidence-based heart failure therapy. 4.  Consultation with internal medicine and/or critical care medicine to assist with management of COVID-19 infection. James Bush, MD [facility]   DG Chest Port 1 View  Result Date: 05/02/2019 CLINICAL DATA:  Chest pain EXAM: PORTABLE CHEST 1 VIEW COMPARISON:  May 08, 2018 FINDINGS: No edema or consolidation. There is cardiomegaly with pulmonary vascularity normal. No adenopathy. There is aortic atherosclerosis. No bone lesions. No pneumothorax. IMPRESSION: Cardiomegaly. Lungs clear. No adenopathy. Aortic Atherosclerosis (ICD10-I70.0). Electronically Signed   By: Lowella Grip III M.D.   On: 05/02/2019 09:47   { Assessment and Plan:   1. Wide QRS tachy - this appears to be atrial fib with a RVR. He is now back in NSR. Continue amiodarone. I cannot exclude VT but rhythm is irregular. Treatment in the short term is unchanged. 2. Atrial fib - we will start IV amiodarone.  3. Covid 19 - he appears to be minimally symptomatic.  For questions or updates, please contact [place] Please consult [email or web address]  for contact info under   Signed, Cristopher Peru, MD  05/02/2019 2:21 PM

## 2019-05-02 NOTE — ED Notes (Signed)
zoll pads applied

## 2019-05-03 DIAGNOSIS — I200 Unstable angina: Secondary | ICD-10-CM

## 2019-05-03 DIAGNOSIS — U071 COVID-19: Secondary | ICD-10-CM

## 2019-05-03 DIAGNOSIS — J9601 Acute respiratory failure with hypoxia: Secondary | ICD-10-CM

## 2019-05-03 LAB — COMPREHENSIVE METABOLIC PANEL
ALT: 14 U/L (ref 0–44)
AST: 37 U/L (ref 15–41)
Albumin: 2.2 g/dL — ABNORMAL LOW (ref 3.5–5.0)
Alkaline Phosphatase: 83 U/L (ref 38–126)
Anion gap: 12 (ref 5–15)
BUN: 27 mg/dL — ABNORMAL HIGH (ref 8–23)
CO2: 25 mmol/L (ref 22–32)
Calcium: 7.8 mg/dL — ABNORMAL LOW (ref 8.9–10.3)
Chloride: 95 mmol/L — ABNORMAL LOW (ref 98–111)
Creatinine, Ser: 4.01 mg/dL — ABNORMAL HIGH (ref 0.61–1.24)
GFR calc Af Amer: 17 mL/min — ABNORMAL LOW (ref >60)
GFR calc non Af Amer: 15 mL/min — ABNORMAL LOW (ref >60)
Glucose, Bld: 115 mg/dL — ABNORMAL HIGH (ref 70–99)
Potassium: 3.8 mmol/L (ref 3.5–5.1)
Sodium: 132 mmol/L — ABNORMAL LOW (ref 135–145)
Total Bilirubin: 1 mg/dL (ref 0.3–1.2)
Total Protein: 6.5 g/dL (ref 6.5–8.1)

## 2019-05-03 LAB — C-REACTIVE PROTEIN: CRP: 19.8 mg/dL — ABNORMAL HIGH (ref <1.0)

## 2019-05-03 LAB — LIPID PANEL
Cholesterol: 80 mg/dL (ref 0–200)
HDL: 21 mg/dL — ABNORMAL LOW (ref >40)
LDL Cholesterol: 48 mg/dL (ref 0–99)
Total CHOL/HDL Ratio: 3.8 RATIO
Triglycerides: 56 mg/dL (ref <150)
VLDL: 11 mg/dL (ref 0–40)

## 2019-05-03 LAB — D-DIMER, QUANTITATIVE: D-Dimer, Quant: 2.18 ug/mL-FEU — ABNORMAL HIGH (ref 0.00–0.50)

## 2019-05-03 LAB — GLUCOSE, CAPILLARY
Glucose-Capillary: 108 mg/dL — ABNORMAL HIGH (ref 70–99)
Glucose-Capillary: 96 mg/dL (ref 70–99)
Glucose-Capillary: 178 mg/dL — ABNORMAL HIGH (ref 70–99)
Glucose-Capillary: 218 mg/dL — ABNORMAL HIGH (ref 70–99)

## 2019-05-03 LAB — CBC
HCT: 27.7 % — ABNORMAL LOW (ref 39.0–52.0)
Hemoglobin: 8.9 g/dL — ABNORMAL LOW (ref 13.0–17.0)
MCH: 29 pg (ref 26.0–34.0)
MCHC: 32.1 g/dL (ref 30.0–36.0)
MCV: 90.2 fL (ref 80.0–100.0)
Platelets: 317 10*3/uL (ref 150–400)
RBC: 3.07 MIL/uL — ABNORMAL LOW (ref 4.22–5.81)
RDW: 14.9 % (ref 11.5–15.5)
WBC: 8.9 10*3/uL (ref 4.0–10.5)
nRBC: 0 % (ref 0.0–0.2)

## 2019-05-03 LAB — MAGNESIUM: Magnesium: 1.9 mg/dL (ref 1.7–2.4)

## 2019-05-03 LAB — FERRITIN: Ferritin: >7500 ng/mL — ABNORMAL HIGH (ref 24–336)

## 2019-05-03 LAB — PHOSPHORUS: Phosphorus: 4.6 mg/dL (ref 2.5–4.6)

## 2019-05-03 LAB — ABO/RH: ABO/RH(D): O POS

## 2019-05-03 MED ORDER — TOCILIZUMAB 400 MG/20ML IV SOLN
8.0000 mg/kg | Freq: Once | INTRAVENOUS | Status: AC
  Administered 2019-05-03: 704 mg via INTRAVENOUS
  Filled 2019-05-03: qty 35.2

## 2019-05-03 MED ORDER — DEXAMETHASONE SODIUM PHOSPHATE 10 MG/ML IJ SOLN
6.0000 mg | INTRAMUSCULAR | Status: DC
  Administered 2019-05-03 – 2019-05-05 (×3): 6 mg via INTRAVENOUS
  Filled 2019-05-03: qty 0.6
  Filled 2019-05-03: qty 1
  Filled 2019-05-03 (×2): qty 0.6

## 2019-05-03 MED ORDER — SODIUM CHLORIDE 0.9 % IV SOLN
100.0000 mg | Freq: Every day | INTRAVENOUS | Status: DC
  Administered 2019-05-04 – 2019-05-05 (×2): 100 mg via INTRAVENOUS
  Filled 2019-05-03 (×2): qty 20

## 2019-05-03 MED ORDER — IPRATROPIUM-ALBUTEROL 20-100 MCG/ACT IN AERS
1.0000 | INHALATION_SPRAY | Freq: Four times a day (QID) | RESPIRATORY_TRACT | Status: DC
  Administered 2019-05-03 – 2019-05-05 (×9): 1 via RESPIRATORY_TRACT
  Filled 2019-05-03: qty 4

## 2019-05-03 MED ORDER — SODIUM CHLORIDE 0.9 % IV SOLN
200.0000 mg | Freq: Once | INTRAVENOUS | Status: AC
  Administered 2019-05-03 (×2): 200 mg via INTRAVENOUS
  Filled 2019-05-03: qty 200

## 2019-05-03 MED ORDER — ENOXAPARIN SODIUM 40 MG/0.4ML ~~LOC~~ SOLN
40.0000 mg | Freq: Two times a day (BID) | SUBCUTANEOUS | Status: DC
  Administered 2019-05-03 – 2019-05-04 (×4): 40 mg via SUBCUTANEOUS
  Filled 2019-05-03 (×4): qty 0.4

## 2019-05-03 MED ORDER — FUROSEMIDE 10 MG/ML IJ SOLN
80.0000 mg | Freq: Once | INTRAMUSCULAR | Status: AC
  Administered 2019-05-03: 80 mg via INTRAVENOUS
  Filled 2019-05-03: qty 8

## 2019-05-03 MED ORDER — AMIODARONE HCL 200 MG PO TABS
400.0000 mg | ORAL_TABLET | Freq: Two times a day (BID) | ORAL | Status: DC
  Administered 2019-05-03 (×2): 400 mg via ORAL
  Filled 2019-05-03 (×2): qty 2

## 2019-05-03 NOTE — Progress Notes (Signed)
[facility] Progress Note  Name: TALIS IWAN MRN: [id number] DOB: 1951/01/17  Chief Complaint:  Chest pressure  Subjective:  Patient seen via window and discussed with nurse.  Due to the nature of this patient's [id number] with isolation and in keeping with efforts to prevent the spread of infection and to conserve personal protective equipment, a physical exam was not personally performed.  Patient's symptoms and exam were discussed in detail with the RN.  A chart review of other providers notes and the patient's lab work as well as review of other pertinent studies was performed.  Exam details from prior documentation were reviewed specifically and confirmed with the bedside nurse.  Location of service: [facility]   Review of systems:  No chest pain per nursing No n/v No shortness of breath   ------------------- Background on consult:  RAYGEN DAHM is a 69 y.o. male history of end-stage renal disease on hemodialysis Monday Wednesday Friday at [facility] who presented to the ER with chest pressure.  He developed chest discomfort on dialysis and treatment was terminated early and he presented to the ER.  He had an abnormal EKG and ultimately underwent heart cath.  There was no new acute change noted with chronic disease appreciated.  He was also found to be Covid positive.  He received about 2 hours of dialysis prior to treatment termination due to the chest pain, concern for arrhythmia, and hypotension.  Note that the treatment prior he left below dry weight at 86.7 kg.  Course earlier today was complicated by what appeared to be A. fib with RVR per EP.  He was started on amiodarone.  He feels as though he would be fine to wait until Monday for his next HD.  He denies any current chest pain shortness of breath nausea or vomiting.  He has some abdominal discomfort.  No vision changes.  No dizziness or cramping.   Intake/Output Summary (Last 24 hours) at 05/03/2019  0102 Last data filed at 05/03/2019 0200 Gross per 24 hour  Intake 608.46 ml  Output 0 ml  Net 608.46 ml    Vitals:  Vitals:   05/03/19 0300 05/03/19 0330 05/03/19 0400 05/03/19 0500  BP: (!) 164/63 (!) 159/63 (!) 162/59 (!) 173/62  Pulse:      Resp: (!) 24 19 (!) 6 (!) 22  Temp:      TempSrc:      SpO2: 97% 98% 100% 100%  Weight:    88.1 kg  Height:         Physical Exam:  General: Adult male in bed in no acute distress at rest HEENT: Normocephalic atraumatic Neck: Supple trachea midline Heart: RRR per nursing Lungs: Clear to auscultation per nursing.  Unlabored at rest 2 liters Abdomen: Soft distended/obese habitus and nontender. Extremities: no edema appreciated LE's; trace edema hands/arms per nursing Neuro: Alert and oriented and follows commands provides a history Psych normal mood and affect Access right upper extremity AV fistula with bruit and thrill  There are two small full bottles of soda, 1 empty bottle, and 3-4 cups on his bedside table and the nurse is going to remove.  Medications reviewed   Labs:  BMP Latest Ref Rng & Units 05/03/2019 05/02/2019 05/02/2019  Glucose 70 - 99 mg/dL 115(H) 97 111(H)  BUN 8 - 23 mg/dL 27(H) 24(H) 16  Creatinine 0.61 - 1.24 mg/dL 4.01(H) 3.56(H) 2.40(H)  BUN/Creat Ratio 10 - 24 - - -  Sodium 135 - 145 mmol/L  132(L) 127(L) 135  Potassium 3.5 - 5.1 mmol/L 3.8 4.0 3.0(L)  Chloride 98 - 111 mmol/L 95(L) 91(L) 100  CO2 22 - 32 mmol/L 25 24 -  Calcium 8.9 - 10.3 mg/dL 7.8(L) 7.8(L) -    Outpatient hemodialysis orders MWF at [place] 4hr, 400/800, EDW 87.3kg, 2K/2.25Ca, UF profile 2, AVF, heparin 2000 bolus - Calcitriol 0.69mcg PO q HD - No current ESA orders - on hold, last given Mircera 85mcg on 04/09/19  Last HD: 1/8 - approx 2 hours of treatment prior to EMS being called for CP, arrhythmia, hypotension 1/6 - left below EDW at 86.7kg    Assessment/Plan:  # Coronary artery disease with chest pain -Per  cardiology  # End-stage renal disease on hemodialysis - Normally HD is per Monday Wednesday Friday schedule - We will assess dialysis needs daily - No acute indication for HD  -anticipate next treatment on Monday, 1/11 unless needed sooner  #COVID-19 infection - Therapies per primary team - on 2 liters  # HTN with hx afib - metropolol is ordered - note also hydral is reported home med  # Chronic heart failure with preserved EF -Appears compensated on exam - lasix 80 mg IV once  - fluid restriction added - 1.2 liters  # Hypokalemia - resolved - defer repletion for today  # Anemia of CKD - last had mircera 60 mcg on 04/09/19  - trend and anticipate redose - defer today.  Presentation initially concerning for ACS - Would give PRBC's if Hb below 7 (8 if desired goal per cardiology)  # Secondary hyperpara - continue calcitriol    Claudia Desanctis, MD 05/03/2019 6:21 AM

## 2019-05-03 NOTE — Progress Notes (Addendum)
PROGRESS NOTE    James Chan    Code Status: Full Code  PXT:[id number] DOB: 10-Apr-1951 DOA: 05/02/2019  PCP: Cher Nakai, MD    Hospital Summary  James Chan is an 69 y.o. male  With past medical history significant for CAD s/p PCI, PAF, ESRD on HD(M/W/F), diastolic CHF, PVD, diabetes mellitus type 2, and s/p CEA.  Patient presented with complaints of palpitations and dizziness whenever changing positions. He went to dialysis this morning and developed chest pain. He was found to be in a wide-complex tachycardia for which EMS was called.  His heart rate was initially noted to be greater than 170 and was temporary hypotensive.  Cardiology formally admitted the patient on admission as he was noted to have a mild elevation in his troponin 21 and for ongoing chest pain.  Patient was taken for cardiac cath.  Patient was incidentally noted to be positive for COVID-19. He reports that he has been coughing over the last 2 weeks, but the cough has been improving this week.  He reports chronically being short of breath, but does not require oxygen.  O2 saturations were noted to be maintained on room air.  Chest x-ray significant for cardiomegaly with clear lungs.  Further evaluation of the wide-complex tachycardia appeared to show atrial fibrillation and patient was started on amiodarone drip.  05/02/18 patient noted to be on 2L O2 via [place], CRP 19. Started on Dexamethasone and Remdesivir and Actemra  A & P   Principal Problem:   Unstable angina (HCC) Active Problems:   PAF (paroxysmal atrial fibrillation) (HCC)   Wide-complex tachycardia ([place])   COVID-19 virus infection   1. Acute hypoxic respiratory failure secondary to COVID-19 a. Now requiring 2 L nasal cannula (Covid vs heart failure) -consider repeat chest x-ray b. CRP 19, D-dimer 2.2 c. Start dexamethasone x10 days, remdesivir x5 days d. Risk/benefit of Actemra discussed with patient, will start given elevated CRP and new O2 requirement    e. Continue vitamins  f. Continue antitussives 2. ESRD on HD Monday, Wednesday, Friday a. Next treatment 1/11 per nephro 3. Unstable angina/NSTEMI a. LHC 05/02/2019: Multivessel CAD b. Medical therapy and further plan for cardiology 4. Wide QRS tachycardia/probable atrial fibrillation with RVR and aberrant conduction, currently rate controlled a. Seen by EP cardio b. On amiodarone drip and to start Lopressor 25 mg daily p.o. c. Further plan per cardiology 5. Chronic combined systolic and diastolic heart failure a. EF 40 to 45% on echo 12/30, EF about 30% on LHC 1/8 b. BNP 2378, troponin 3146 c. Plan per cardiology 6. Hypokalemia a. K3.8 7. Anemia of CKD a. Goal Hb likely > 8.0 given acute cardiac issues above  TR[facility] will continue to follow with you    Consultants  Nephrology Cardiology EP cardiology Internal medicine  Procedures  [facility] 05/02/2019  Antibiotics   Anti-infectives (From admission, onward)   Start     Dose/Rate Route Frequency Ordered Stop   05/04/19 1000  remdesivir 100 mg in sodium chloride 0.9 % 100 mL IVPB     100 mg 200 mL/hr over 30 Minutes Intravenous Daily 05/03/19 0735 05/08/19 0959   05/03/19 1000  remdesivir 200 mg in sodium chloride 0.9% 250 mL IVPB     200 mg 580 mL/hr over 30 Minutes Intravenous Once 05/03/19 0735             Subjective   Seen and examined at bedside no acute distress resting comfortably.  Currently denies any complaints.  On 2 L nasal cannula.  Denies any chest pain, fever, chills, night sweats, nausea, vomiting, GI issues.  Admits to occasional brief palpitations  Objective   Vitals:   05/03/19 0300 05/03/19 0330 05/03/19 0400 05/03/19 0500  BP: (!) 164/63 (!) 159/63 (!) 162/59 (!) 173/62  Pulse:      Resp: (!) 24 19 (!) 6 (!) 22  Temp:      TempSrc:      SpO2: 97% 98% 100% 100%  Weight:    88.1 kg  Height:        Intake/Output Summary (Last 24 hours) at 05/03/2019 0935 Last data filed at 05/03/2019 0200 Gross  per 24 hour  Intake 608.46 ml  Output 0 ml  Net 608.46 ml   Filed Weights   05/02/19 0932 05/03/19 0500  Weight: 86.2 kg 88.1 kg    Examination:  Physical Exam Vitals and nursing note reviewed.  HENT:     Head: Normocephalic.  Eyes:     Extraocular Movements: Extraocular movements intact.  Cardiovascular:     Rate and Rhythm: Normal rate and regular rhythm.  Pulmonary:     Effort: No tachypnea or accessory muscle usage.     Breath sounds: Normal breath sounds.  Abdominal:     General: Bowel sounds are normal.     Palpations: Abdomen is soft.  Musculoskeletal:     Right lower leg: No edema.     Left lower leg: No edema.  Skin:    Coloration: Skin is not cyanotic or pale.  Neurological:     General: No focal deficit present.     Mental Status: He is alert.  Psychiatric:        Mood and Affect: Mood normal.        Behavior: Behavior normal.     Data Reviewed: I have personally reviewed following labs and imaging studies  CBC: Recent Labs  Lab 05/02/19 0939 05/02/19 0953 05/03/19 0111  WBC 8.8  --  8.9  HGB 9.4*  8.8* 9.2* 8.9*  HCT 29.5*  26.0* 27.0* 27.7*  MCV 92.2  --  90.2  PLT 305  --  856   Basic Metabolic Panel: Recent Labs  Lab 05/02/19 0939 05/02/19 0953 05/02/19 1742 05/03/19 0111  NA 135  135 135 127* 132*  K 3.3*  3.0* 3.0* 4.0 3.8  CL 96*  100 100 91* 95*  CO2 24  --  24 25  GLUCOSE 123*  112* 111* 97 115*  BUN 18  16 16  24* 27*  CREATININE 2.62*  2.40* 2.40* 3.56* 4.01*  CALCIUM 7.7*  --  7.8* 7.8*  MG 1.9  --   --  1.9  PHOS  --   --   --  4.6   GFR: Estimated Creatinine Clearance: 19.9 mL/min (A) (by C-G formula based on SCr of 4.01 mg/dL (H)). Liver Function Tests: Recent Labs  Lab 05/03/19 0111  AST 37  ALT 14  ALKPHOS 83  BILITOT 1.0  PROT 6.5  ALBUMIN 2.2*   No results for input(s): LIPASE, AMYLASE in the last 168 hours. No results for input(s): AMMONIA in the last 168 hours. Coagulation Profile: No  results for input(s): INR, PROTIME in the last 168 hours. Cardiac Enzymes: No results for input(s): CKTOTAL, CKMB, CKMBINDEX, TROPONINI in the last 168 hours. BNP (last 3 results) No results for input(s): PROBNP in the last 8760 hours. HbA1C: Recent Labs    05/02/19 1742  HGBA1C 5.6   CBG: Recent Labs  Lab 05/02/19 1636 05/02/19 2246 05/03/19 0647  GLUCAP 99 129* 108*   Lipid Profile: Recent Labs    05/03/19 0111  CHOL 80  HDL 21*  LDLCALC 48  TRIG 56  CHOLHDL 3.8   Thyroid Function Tests: No results for input(s): TSH, T4TOTAL, FREET4, T3FREE, THYROIDAB in the last 72 hours. Anemia Panel: Recent Labs    05/02/19 1742 05/03/19 0111  FERRITIN >7,500* >7,500*   Sepsis Labs: Recent Labs  Lab 05/02/19 1742  PROCALCITON 0.31    Recent Results (from the past 240 hour(s))  Respiratory Panel by RT PCR (Flu A&B, Covid) - Nasopharyngeal Swab     Status: Abnormal   Collection Time: 05/02/19  9:33 AM   Specimen: Nasopharyngeal Swab  Result Value Ref Range Status   SARS Coronavirus 2 by RT PCR POSITIVE (A) NEGATIVE Final    Comment: RESULT CALLED TO, READ BACK BY AND VERIFIED WITH: Cath Lab 11:25 05/02/19 ([email or web address]) (NOTE) SARS-CoV-2 target nucleic acids are DETECTED. SARS-CoV-2 RNA is generally detectable in upper respiratory specimens  during the acute phase of infection. Positive results are indicative of the presence of the identified virus, but do not rule out bacterial infection or co-infection with other pathogens not detected by the test. Clinical correlation with patient history and other diagnostic information is necessary to determine patient infection status. The expected result is Negative. Fact Sheet for Patients:  PinkCheek.be Fact Sheet for Healthcare Providers: GravelBags.it This test is not yet approved or cleared by the [place] FDA and  has been authorized for detection and/or diagnosis  of SARS-CoV-2 by FDA under an Emergency Use Authorization (EUA).  This EUA will remain in effect (meaning this test can be used) for t he duration of  the COVID-19 declaration under Section 564(b)(1) of the Act, 21 U.S.C. section 360bbb-3(b)(1), unless the authorization is terminated or revoked sooner.    Influenza A by PCR NEGATIVE NEGATIVE Final   Influenza B by PCR NEGATIVE NEGATIVE Final    Comment: (NOTE) The Xpert Xpress SARS-CoV-2/FLU/RSV assay is intended as an aid in  the diagnosis of influenza from Nasopharyngeal swab specimens and  should not be used as a sole basis for treatment. Nasal washings and  aspirates are unacceptable for Xpert Xpress SARS-CoV-2/FLU/RSV  testing. Fact Sheet for Patients: PinkCheek.be Fact Sheet for Healthcare Providers: GravelBags.it This test is not yet approved or cleared by the [place] FDA and  has been authorized for detection and/or diagnosis of SARS-CoV-2 by  FDA under an Emergency Use Authorization (EUA). This EUA will remain  in effect (meaning this test can be used) for the duration of the  Covid-19 declaration under Section 564(b)(1) of the Act, 21  U.S.C. section 360bbb-3(b)(1), unless the authorization is  terminated or revoked. Performed at [facility], [place] [address] [place], [place] [address]   MRSA PCR Screening     Status: None   Collection Time: 05/02/19  2:56 PM   Specimen: Nasopharyngeal  Result Value Ref Range Status   MRSA by PCR NEGATIVE NEGATIVE Final    Comment:        The GeneXpert MRSA Assay (FDA approved for NASAL specimens only), is one component of a comprehensive MRSA colonization surveillance program. It is not intended to diagnose MRSA infection nor to guide or monitor treatment for MRSA infections. Performed at [facility], [place] [address] [place], [place] [address]          Radiology Studies: CARDIAC  CATHETERIZATION  Result Date:  05/02/2019 Conclusions: 1. Multivessel coronary artery disease including 50-60% distal LMCA stenosis, 30% proximal LAD and 50% ostial D1 lesions, and chronic total occlusions of distal LCx and long stented segment of the proximal through distal RCA.  Occlusions of the LCx and RCA are known to be chronic based on prior outside catheterization reports. 2. Severely reduced left ventricular systolic function (LVEF ~09%) with mildly elevated filling pressure. Recommendations: 1. Medical therapy of worsening cardiomyopathy complicated by wide-complex tachycardia.  Recommend formal consultation with EP. 2. Medical therapy of left main disease and LCx/RCA chronic total occlusions in the setting of acute comorbidities.  When patient has been optimized from a heart failure standpoint and COVID-19 resolved, revascularization options may need to be readdressed. 3. Optimize evidence-based heart failure therapy. 4. Consultation with internal medicine and/or critical care medicine to assist with management of COVID-19 infection. Nelva Bush, MD [facility]   DG Chest Port 1 View  Result Date: 05/02/2019 CLINICAL DATA:  Chest pain EXAM: PORTABLE CHEST 1 VIEW COMPARISON:  May 08, 2018 FINDINGS: No edema or consolidation. There is cardiomegaly with pulmonary vascularity normal. No adenopathy. There is aortic atherosclerosis. No bone lesions. No pneumothorax. IMPRESSION: Cardiomegaly. Lungs clear. No adenopathy. Aortic Atherosclerosis (ICD10-I70.0). Electronically Signed   By: Lowella Grip III M.D.   On: 05/02/2019 09:47        Scheduled Meds: . allopurinol  100 mg Oral QHS  . vitamin C  500 mg Oral Daily  . aspirin  81 mg Oral Daily  . atorvastatin  40 mg Oral QPM  . [START ON 05/05/2019] calcitRIOL  0.5 mcg Oral Q M,W,F-HD  . Chlorhexidine Gluconate Cloth  6 each Topical Daily  . clopidogrel  75 mg Oral Q supper  . dexamethasone (DECADRON) injection  6 mg Intravenous  Q24H  . heparin  5,000 Units Subcutaneous Q8H  . insulin aspart  0-15 Units Subcutaneous TID WC  . insulin glargine  15 Units Subcutaneous BID  . Ipratropium-Albuterol  1 puff Inhalation Q6H  . latanoprost  1 drop Both Eyes QHS  . metoprolol tartrate  25 mg Oral Daily  . sodium chloride flush  3 mL Intravenous Q12H  . sodium chloride flush  3 mL Intravenous Q12H  . zinc sulfate  220 mg Oral Daily   Continuous Infusions: . sodium chloride    . sodium chloride    . amiodarone 30 mg/hr (05/03/19 0200)  . remdesivir 200 mg in sodium chloride 0.9% 250 mL IVPB     Followed by  . [START ON 05/04/2019] remdesivir 100 mg in NS 100 mL       LOS: 1 day    Time spent: 28 minutes with over 50% of the time coordinating the patient's care    Harold Hedge, DO [facility] Pager [phone]  If 7PM-7AM, please contact night-coverage [email or web address] Password [email or web address] 05/03/2019, 9:35 AM

## 2019-05-03 NOTE — Progress Notes (Signed)
Progress Note  Patient Name: James Chan Date of Encounter: 05/03/2019  Primary Cardiologist:   Shirlee More, MD   Subjective   He denies chest pain or SOB.   Inpatient Medications    Scheduled Meds: . allopurinol  100 mg Oral QHS  . vitamin C  500 mg Oral Daily  . aspirin  81 mg Oral Daily  . atorvastatin  40 mg Oral QPM  . [START ON 05/05/2019] calcitRIOL  0.5 mcg Oral Q M,W,F-HD  . Chlorhexidine Gluconate Cloth  6 each Topical Daily  . clopidogrel  75 mg Oral Q supper  . dexamethasone (DECADRON) injection  6 mg Intravenous Q24H  . heparin  5,000 Units Subcutaneous Q8H  . insulin aspart  0-15 Units Subcutaneous TID WC  . insulin glargine  15 Units Subcutaneous BID  . Ipratropium-Albuterol  1 puff Inhalation Q6H  . latanoprost  1 drop Both Eyes QHS  . metoprolol tartrate  25 mg Oral Daily  . sodium chloride flush  3 mL Intravenous Q12H  . sodium chloride flush  3 mL Intravenous Q12H  . zinc sulfate  220 mg Oral Daily   Continuous Infusions: . sodium chloride    . sodium chloride    . amiodarone 30 mg/hr (05/03/19 0200)  . remdesivir 200 mg in sodium chloride 0.9% 250 mL IVPB     Followed by  . [START ON 05/04/2019] remdesivir 100 mg in NS 100 mL     PRN Meds: sodium chloride, sodium chloride, acetaminophen, ALPRAZolam, chlorpheniramine-HYDROcodone, guaiFENesin-dextromethorphan, nitroGLYCERIN, ondansetron (ZOFRAN) IV, oxyCODONE-acetaminophen, polyethylene glycol, sodium chloride flush, sodium chloride flush, zolpidem   Vital Signs    Vitals:   05/03/19 0300 05/03/19 0330 05/03/19 0400 05/03/19 0500  BP: (!) 164/63 (!) 159/63 (!) 162/59 (!) 173/62  Pulse:      Resp: (!) 24 19 (!) 6 (!) 22  Temp:      TempSrc:      SpO2: 97% 98% 100% 100%  Weight:    88.1 kg  Height:        Intake/Output Summary (Last 24 hours) at 05/03/2019 0758 Last data filed at 05/03/2019 0200 Gross per 24 hour  Intake 608.46 ml  Output 0 ml  Net 608.46 ml   Filed Weights   05/02/19 0932 05/03/19 0500  Weight: 86.2 kg 88.1 kg    Telemetry    NSR - Personally Reviewed  ECG    NA - Personally Reviewed  Physical Exam   GEN: No acute distress.   Neck: No JVD Cardiac: RRR, no murmurs, rubs, or gallops.  Respiratory: Clear  to auscultation bilaterally. GI: Soft, nontender, non-distended  MS: No edema; No deformity. Neuro:  Nonfocal  Psych: Normal affect   Labs    Chemistry Recent Labs  Lab 05/02/19 0939 05/02/19 0953 05/02/19 1742 05/03/19 0111  NA 135  135 135 127* 132*  K 3.3*  3.0* 3.0* 4.0 3.8  CL 96*  100 100 91* 95*  CO2 24  --  24 25  GLUCOSE 123*  112* 111* 97 115*  BUN 18  16 16  24* 27*  CREATININE 2.62*  2.40* 2.40* 3.56* 4.01*  CALCIUM 7.7*  --  7.8* 7.8*  PROT  --   --   --  6.5  ALBUMIN  --   --   --  2.2*  AST  --   --   --  37  ALT  --   --   --  14  ALKPHOS  --   --   --  30  BILITOT  --   --   --  1.0  GFRNONAA 24*  --  17* 15*  GFRAA 28*  --  19* 17*  ANIONGAP 15  --  12 12     Hematology Recent Labs  Lab 05/02/19 0939 05/02/19 0953 05/03/19 0111  WBC 8.8  --  8.9  RBC 3.20*  --  3.07*  HGB 9.4*  8.8* 9.2* 8.9*  HCT 29.5*  26.0* 27.0* 27.7*  MCV 92.2  --  90.2  MCH 29.4  --  29.0  MCHC 31.9  --  32.1  RDW 14.3  --  14.9  PLT 305  --  317    Cardiac EnzymesNo results for input(s): TROPONINI in the last 168 hours. No results for input(s): TROPIPOC in the last 168 hours.   BNP Recent Labs  Lab 05/02/19 1742  BNP 2,378.8*     DDimer  Recent Labs  Lab 05/02/19 1742 05/03/19 0111  DDIMER 2.12* 2.18*     Radiology    CARDIAC CATHETERIZATION  Result Date: 05/02/2019 Conclusions: 1. Multivessel coronary artery disease including 50-60% distal LMCA stenosis, 30% proximal LAD and 50% ostial D1 lesions, and chronic total occlusions of distal LCx and long stented segment of the proximal through distal RCA.  Occlusions of the LCx and RCA are known to be chronic based on prior outside  catheterization reports. 2. Severely reduced left ventricular systolic function (LVEF ~77%) with mildly elevated filling pressure. Recommendations: 1. Medical therapy of worsening cardiomyopathy complicated by wide-complex tachycardia.  Recommend formal consultation with EP. 2. Medical therapy of left main disease and LCx/RCA chronic total occlusions in the setting of acute comorbidities.  When patient has been optimized from a heart failure standpoint and COVID-19 resolved, revascularization options may need to be readdressed. 3. Optimize evidence-based heart failure therapy. 4. Consultation with internal medicine and/or critical care medicine to assist with management of COVID-19 infection. Nelva Bush, MD [facility]   DG Chest Port 1 View  Result Date: 05/02/2019 CLINICAL DATA:  Chest pain EXAM: PORTABLE CHEST 1 VIEW COMPARISON:  May 08, 2018 FINDINGS: No edema or consolidation. There is cardiomegaly with pulmonary vascularity normal. No adenopathy. There is aortic atherosclerosis. No bone lesions. No pneumothorax. IMPRESSION: Cardiomegaly. Lungs clear. No adenopathy. Aortic Atherosclerosis (ICD10-I70.0). Electronically Signed   By: Lowella Grip III M.D.   On: 05/02/2019 09:47    Cardiac Studies   ECHO: 04/23/2019 1. Left ventricular ejection fraction, by visual estimation, is 40 to 45%. The left ventricle has mild to moderately decreased function with global hypokinesis. The left ventricular cavity size is mildy dilated. There is moderate to severe increased eccentric left ventricular hypertrophy. 2. Left ventricular diastolic parameters are consistent with Grade II diastolic dysfunction (pseudonormalization). 3. The Global left ventricular strain is abnormal (-6.2%). 4. Global right ventricle has normal systolic function.The right ventricular size is normal. No increase in right ventricular wall thickness. 5. Left atrial size was mildly dilated. 6. Right atrial size was mildly  dilated. 7. The mitral valve is normal in structure. Mild to moderate mitral valve regurgitation. No evidence of mitral stenosis. 8. The tricuspid valve is normal in structure. 9. The aortic valve is trileaflet and thickened. Aortic regurgitation is not visualized. Mild aortic valve sclerosis without stenosis. 10. The pulmonic valve was normal in structure. Pulmonic valve regurgitation is not visualized. 11. There is mild dilatation of the ascending aorta measuring 39 mm. 12. Mild Pulmonary Hypertension. Pulmonary artery systolic pressure 38 mmHg. 13. Compared  to TTE done in July 2019, the Left ventricular ejection fraction is reduced from 50- 55% to now 40-45%. The mitral regurgitation is no longer moderate to severe.  Diagnostic Dominance: Right  1. Multivessel coronary artery disease including 50-60% distal LMCA stenosis, 30% proximal LAD and 50% ostial D1 lesions, and chronic total occlusions of distal LCx and long stented segment of the proximal through distal RCA.  Occlusions of the LCx and RCA are known to be chronic based on prior outside catheterization reports. 2. Severely reduced left ventricular systolic function (LVEF ~44%) with mildly elevated filling pressure.  Patient Profile     69 y.o. 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  Covid positive.   Assessment & Plan    UNSTABLE ANGINA/NSTEMI:   Medical therapy.    WIDE COMPLEX TACHYCARDIA:  Seen by EP.  Probable atrial fib with RVR and aberrant conduction.  Now maintaining NSR.   OK to stop IV amiodarone and change to PO.    ESRD:  Dialysis per nephrology.     COVID:  Per hospitalists.    OK to transfer to 5W.      ANEMIA OF CHRONIC DISEASE: Stable.   HYPOKALEMIA:  Supplemented.     For questions or updates, please contact [facility] Please consult [email or web address] for contact info under Cardiology/STEMI.   Signed, Minus Breeding, MD  05/03/2019, 7:58 AM

## 2019-05-04 LAB — BASIC METABOLIC PANEL
Anion gap: 18 — ABNORMAL HIGH (ref 5–15)
BUN: 39 mg/dL — ABNORMAL HIGH (ref 8–23)
CO2: 19 mmol/L — ABNORMAL LOW (ref 22–32)
Calcium: 7.6 mg/dL — ABNORMAL LOW (ref 8.9–10.3)
Chloride: 89 mmol/L — ABNORMAL LOW (ref 98–111)
Creatinine, Ser: 5.65 mg/dL — ABNORMAL HIGH (ref 0.61–1.24)
GFR calc Af Amer: 11 mL/min — ABNORMAL LOW (ref >60)
GFR calc non Af Amer: 10 mL/min — ABNORMAL LOW (ref >60)
Glucose, Bld: 171 mg/dL — ABNORMAL HIGH (ref 70–99)
Potassium: 4.2 mmol/L (ref 3.5–5.1)
Sodium: 126 mmol/L — ABNORMAL LOW (ref 135–145)

## 2019-05-04 LAB — PHOSPHORUS: Phosphorus: 5.4 mg/dL — ABNORMAL HIGH (ref 2.5–4.6)

## 2019-05-04 LAB — GLUCOSE, CAPILLARY
Glucose-Capillary: 180 mg/dL — ABNORMAL HIGH (ref 70–99)
Glucose-Capillary: 174 mg/dL — ABNORMAL HIGH (ref 70–99)
Glucose-Capillary: 204 mg/dL — ABNORMAL HIGH (ref 70–99)
Glucose-Capillary: 196 mg/dL — ABNORMAL HIGH (ref 70–99)

## 2019-05-04 LAB — FERRITIN: Ferritin: 4177 ng/mL — ABNORMAL HIGH (ref 24–336)

## 2019-05-04 LAB — D-DIMER, QUANTITATIVE: D-Dimer, Quant: 2.01 ug/mL-FEU — ABNORMAL HIGH (ref 0.00–0.50)

## 2019-05-04 LAB — MAGNESIUM: Magnesium: 1.9 mg/dL (ref 1.7–2.4)

## 2019-05-04 LAB — C-REACTIVE PROTEIN: CRP: 15 mg/dL — ABNORMAL HIGH (ref <1.0)

## 2019-05-04 MED ORDER — HYDRALAZINE HCL 20 MG/ML IJ SOLN
10.0000 mg | Freq: Once | INTRAMUSCULAR | Status: AC
  Administered 2019-05-04: 10 mg via INTRAVENOUS
  Filled 2019-05-04: qty 1

## 2019-05-04 MED ORDER — CHLORHEXIDINE GLUCONATE CLOTH 2 % EX PADS: 6.0000 | MEDICATED_PAD | Freq: Every day | CUTANEOUS | Status: DC

## 2019-05-04 MED ORDER — FUROSEMIDE 10 MG/ML IJ SOLN
80.0000 mg | Freq: Once | INTRAMUSCULAR | Status: AC
  Administered 2019-05-04: 80 mg via INTRAVENOUS
  Filled 2019-05-04: qty 8

## 2019-05-04 MED ORDER — CLONIDINE HCL 0.1 MG PO TABS
0.1000 mg | ORAL_TABLET | Freq: Two times a day (BID) | ORAL | Status: DC
  Administered 2019-05-04 – 2019-05-05 (×3): 0.1 mg via ORAL
  Filled 2019-05-04 (×3): qty 1

## 2019-05-04 MED ORDER — AMIODARONE HCL 200 MG PO TABS
200.0000 mg | ORAL_TABLET | Freq: Every day | ORAL | Status: DC
  Administered 2019-05-04 – 2019-05-05 (×2): 200 mg via ORAL
  Filled 2019-05-04 (×2): qty 1

## 2019-05-04 MED ORDER — HYDRALAZINE HCL 25 MG PO TABS
25.0000 mg | ORAL_TABLET | Freq: Three times a day (TID) | ORAL | Status: DC
  Administered 2019-05-04 – 2019-05-05 (×4): 25 mg via ORAL
  Filled 2019-05-04 (×5): qty 1

## 2019-05-04 NOTE — Progress Notes (Signed)
Progress Note  Patient Name: TACARI REPASS Date of Encounter: 05/04/2019  Primary Cardiologist:   Shirlee More, MD   Subjective   No pain.  No SOB.   Inpatient Medications    Scheduled Meds: . allopurinol  100 mg Oral QHS  . amiodarone  400 mg Oral BID  . vitamin C  500 mg Oral Daily  . aspirin  81 mg Oral Daily  . atorvastatin  40 mg Oral QPM  . [START ON 05/05/2019] calcitRIOL  0.5 mcg Oral Q M,W,F-HD  . Chlorhexidine Gluconate Cloth  6 each Topical Daily  . clopidogrel  75 mg Oral Q supper  . dexamethasone (DECADRON) injection  6 mg Intravenous Q24H  . enoxaparin (LOVENOX) injection  40 mg Subcutaneous Q12H  . hydrALAZINE  25 mg Oral Q8H  . insulin aspart  0-15 Units Subcutaneous TID WC  . insulin glargine  15 Units Subcutaneous BID  . Ipratropium-Albuterol  1 puff Inhalation Q6H  . latanoprost  1 drop Both Eyes QHS  . metoprolol tartrate  25 mg Oral Daily  . sodium chloride flush  3 mL Intravenous Q12H  . sodium chloride flush  3 mL Intravenous Q12H  . zinc sulfate  220 mg Oral Daily   Continuous Infusions: . sodium chloride Stopped (05/03/19 1951)  . sodium chloride    . remdesivir 100 mg in NS 100 mL     PRN Meds: sodium chloride, sodium chloride, acetaminophen, ALPRAZolam, chlorpheniramine-HYDROcodone, guaiFENesin-dextromethorphan, nitroGLYCERIN, ondansetron (ZOFRAN) IV, oxyCODONE-acetaminophen, polyethylene glycol, sodium chloride flush, sodium chloride flush, zolpidem   Vital Signs    Vitals:   05/04/19 0400 05/04/19 0411 05/04/19 0449 05/04/19 0524  BP: (!) 197/80 (!) 193/75 (!) 184/72 (!) 170/68  Pulse:      Resp: (!) 22 (!) 23 (!) 26 (!) 23  Temp: 98.1 F (36.7 C)     TempSrc: Oral     SpO2: 95% 94% 95% 94%  Weight: 89.4 kg     Height:        Intake/Output Summary (Last 24 hours) at 05/04/2019 0750 Last data filed at 05/04/2019 0400 Gross per 24 hour  Intake 1445.38 ml  Output 300 ml  Net 1145.38 ml   Filed Weights   05/02/19 0932  05/03/19 0500 05/04/19 0400  Weight: 86.2 kg 88.1 kg 89.4 kg    Telemetry    NSR - Personally Reviewed  ECG    NA - Personally Reviewed  Physical Exam   GEN: No  acute distress.   Neck: No  JVD Cardiac: RRR, no murmurs, rubs, or gallops.  Respiratory: Clear   to auscultation bilaterally. GI: Soft, nontender, non-distended, normal bowel sounds  MS:  No edema; No deformity. Neuro:   Nonfocal  Psych: Oriented and appropriate    Labs    Chemistry Recent Labs  Lab 05/02/19 1742 05/03/19 0111 05/04/19 0320  NA 127* 132* 126*  K 4.0 3.8 4.2  CL 91* 95* 89*  CO2 24 25 19*  GLUCOSE 97 115* 171*  BUN 24* 27* 39*  CREATININE 3.56* 4.01* 5.65*  CALCIUM 7.8* 7.8* 7.6*  PROT  --  6.5  --   ALBUMIN  --  2.2*  --   AST  --  37  --   ALT  --  14  --   ALKPHOS  --  83  --   BILITOT  --  1.0  --   GFRNONAA 17* 15* 10*  GFRAA 19* 17* 11*  ANIONGAP 12 12 18*  Hematology Recent Labs  Lab 05/02/19 0939 05/02/19 0953 05/03/19 0111  WBC 8.8  --  8.9  RBC 3.20*  --  3.07*  HGB 9.4*  8.8* 9.2* 8.9*  HCT 29.5*  26.0* 27.0* 27.7*  MCV 92.2  --  90.2  MCH 29.4  --  29.0  MCHC 31.9  --  32.1  RDW 14.3  --  14.9  PLT 305  --  317    Cardiac EnzymesNo results for input(s): TROPONINI in the last 168 hours. No results for input(s): TROPIPOC in the last 168 hours.   BNP Recent Labs  Lab 05/02/19 1742  BNP 2,378.8*     DDimer  Recent Labs  Lab 05/02/19 1742 05/03/19 0111 05/04/19 0320  DDIMER 2.12* 2.18* 2.01*     Radiology    CARDIAC CATHETERIZATION  Result Date: 05/02/2019 Conclusions: 1. Multivessel coronary artery disease including 50-60% distal LMCA stenosis, 30% proximal LAD and 50% ostial D1 lesions, and chronic total occlusions of distal LCx and long stented segment of the proximal through distal RCA.  Occlusions of the LCx and RCA are known to be chronic based on prior outside catheterization reports. 2. Severely reduced left ventricular systolic  function (LVEF ~40%) with mildly elevated filling pressure. Recommendations: 1. Medical therapy of worsening cardiomyopathy complicated by wide-complex tachycardia.  Recommend formal consultation with EP. 2. Medical therapy of left main disease and LCx/RCA chronic total occlusions in the setting of acute comorbidities.  When patient has been optimized from a heart failure standpoint and COVID-19 resolved, revascularization options may need to be readdressed. 3. Optimize evidence-based heart failure therapy. 4. Consultation with internal medicine and/or critical care medicine to assist with management of COVID-19 infection. Nelva Bush, MD [facility]   DG Chest Port 1 View  Result Date: 05/02/2019 CLINICAL DATA:  Chest pain EXAM: PORTABLE CHEST 1 VIEW COMPARISON:  May 08, 2018 FINDINGS: No edema or consolidation. There is cardiomegaly with pulmonary vascularity normal. No adenopathy. There is aortic atherosclerosis. No bone lesions. No pneumothorax. IMPRESSION: Cardiomegaly. Lungs clear. No adenopathy. Aortic Atherosclerosis (ICD10-I70.0). Electronically Signed   By: Lowella Grip III M.D.   On: 05/02/2019 09:47    Cardiac Studies   ECHO: 04/23/2019 1. Left ventricular ejection fraction, by visual estimation, is 40 to 45%. The left ventricle has mild to moderately decreased function with global hypokinesis. The left ventricular cavity size is mildy dilated. There is moderate to severe increased eccentric left ventricular hypertrophy. 2. Left ventricular diastolic parameters are consistent with Grade II diastolic dysfunction (pseudonormalization). 3. The Global left ventricular strain is abnormal (-6.2%). 4. Global right ventricle has normal systolic function.The right ventricular size is normal. No increase in right ventricular wall thickness. 5. Left atrial size was mildly dilated. 6. Right atrial size was mildly dilated. 7. The mitral valve is normal in structure. Mild to moderate  mitral valve regurgitation. No evidence of mitral stenosis. 8. The tricuspid valve is normal in structure. 9. The aortic valve is trileaflet and thickened. Aortic regurgitation is not visualized. Mild aortic valve sclerosis without stenosis. 10. The pulmonic valve was normal in structure. Pulmonic valve regurgitation is not visualized. 11. There is mild dilatation of the ascending aorta measuring 39 mm. 12. Mild Pulmonary Hypertension. Pulmonary artery systolic pressure 38 mmHg. 13. Compared to TTE done in July 2019, the Left ventricular ejection fraction is reduced from 50- 55% to now 40-45%. The mitral regurgitation is no longer moderate to severe.  Diagnostic Dominance: Right  1. Multivessel coronary artery disease  including 50-60% distal LMCA stenosis, 30% proximal LAD and 50% ostial D1 lesions, and chronic total occlusions of distal LCx and long stented segment of the proximal through distal RCA.  Occlusions of the LCx and RCA are known to be chronic based on prior outside catheterization reports. 2. Severely reduced left ventricular systolic function (LVEF ~51%) with mildly elevated filling pressure.  Patient Profile     69 y.o. 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  Covid positive.   Assessment & Plan    UNSTABLE ANGINA/NSTEMI:    Medical therapy.  No further pain.  Continue current meds.   WIDE COMPLEX TACHYCARDIA:  Seen by EP.  Probable atrial fib with RVR and aberrant conduction.  Now maintaining NSR.   Stopped IV amiodarone yesterday and on PO.  I will reduce to 200 mg daily as that was a previous dose.   ESRD:  Dialysis planned again for Monday.  Hyponatremia.   Probably dilutional.  I would like to keep him until tomorrow after dialysis.     COVID:  Per hospitalists.    OK to transfer to 5W.     HTN:  BP elevated last night.   I will order his prn clonidine bid dose if SBP is greater than 160 on non dialysis days.  He has had labile BPs.        ANEMIA OF CHRONIC DISEASE: Stable.  No indication for transfusion at this point.     HYPOKALEMIA:  Supplemented.     For questions or updates, please contact [place] Please consult [email or web address] for contact info under Cardiology/STEMI.   Signed, Minus Breeding, MD  05/04/2019, 7:50 AM

## 2019-05-04 NOTE — Progress Notes (Signed)
Notified Lovena Le MD via amnion regarding pt's BP 197/80 and no PRN meds for BP ordered at this time. Awaiting further orders.

## 2019-05-04 NOTE — Progress Notes (Signed)
PROGRESS NOTE    James Chan    Code Status: Full Code  EXB:[id number] DOB: [birth year] DOA: 05/02/2019  PCP: Cher Nakai, MD    Hospital Summary  James Chan is an 69 y.o. male  With past medical history significant for CAD s/p PCI, PAF, ESRD on HD(M/W/F), diastolic CHF, PVD, diabetes mellitus type 2, and s/p CEA.  Patient presented with complaints of palpitations and dizziness whenever changing positions. He went to dialysis this morning and developed chest pain. He was found to be in a wide-complex tachycardia for which EMS was called.  His heart rate was initially noted to be greater than 170 and was temporary hypotensive.  Cardiology formally admitted the patient on admission as he was noted to have a mild elevation in his troponin 21 and for ongoing chest pain.  Patient was taken for cardiac cath.  Patient was incidentally noted to be positive for COVID-19. He reports that he has been coughing over the last 2 weeks, but the cough has been improving this week.  He reports chronically being short of breath, but does not require oxygen.  O2 saturations were noted to be maintained on room air.  Chest x-ray significant for cardiomegaly with clear lungs.  Further evaluation of the wide-complex tachycardia appeared to show atrial fibrillation and patient was started on amiodarone drip.  05/02/18 patient noted to be on 2L O2 via [place], CRP 19. Started on Dexamethasone and Remdesivir and Actemra  A & P   Principal Problem:   Unstable angina (HCC) Active Problems:   PAF (paroxysmal atrial fibrillation) (HCC)   Wide-complex tachycardia ([place])   COVID-19 virus infection   1. Acute hypoxic respiratory failure secondary to COVID-19 a. Now on room air  b. CRP trending down, D-dimer stable c. dexamethasone Day 2/10, remdesivir day 2/5, S/p Actemra x 1 (1/19) d. Continue vitamins  e. Continue antitussives f. If patient discharged prior to completion of remdesivir, he will need to be set up for  further infusions in the outpatient clinic with last dose 1/13.  I can arrange this tomorrow if patient is to be discharged after dialysis.  He also can be discharged on dexamethasone 6 mg p.o. daily to complete remaining 10 days. g. He will need to continue self-isolation per CDC guidelines which I discussed with him and will attached to his discharge paperwork 2. ESRD on HD Monday, Wednesday, Friday a. Next treatment 1/11 per nephro 3. Unstable angina/NSTEMI a. No further pain b. LHC 05/02/2019: Multivessel CAD c. Medical therapy and further plan for cardiology 4. Wide QRS tachycardia/probable atrial fibrillation with RVR and aberrant conduction, currently rate controlled a. Seen by EP cardio b. Off amiodarone drip, now on Amiodarone PO and Lopressor 25 mg daily p.o. c. Further plan per cardiology 5. Chronic combined systolic and diastolic heart failure a. EF 40 to 45% on echo 12/30, EF about 30% on LHC 1/8 b. BNP 2378, troponin 3146 c. Plan per cardiology 6. Hypokalemia a. resolved 7. Anemia of CKD a. Goal Hb likely > 8.0 given acute cardiac issues above  TRH will continue to follow with you    Consultants  Nephrology Cardiology EP cardiology Internal medicine  Procedures  [facility] 05/02/2019  Antibiotics   Anti-infectives (From admission, onward)   Start     Dose/Rate Route Frequency Ordered Stop   05/04/19 1000  remdesivir 100 mg in sodium chloride 0.9 % 100 mL IVPB     100 mg 200 mL/hr over 30 Minutes Intravenous Daily 05/03/19  5035 05/08/19 0959   05/03/19 1000  remdesivir 200 mg in sodium chloride 0.9% 250 mL IVPB     200 mg 580 mL/hr over 30 Minutes Intravenous Once 05/03/19 0735 05/03/19 1314           Subjective   Patient evaluated at bedside in no acute distress resting comfortably on room air.  Denies any current complaints other than occasional dry cough.  No shortness of breath, no wheezing, no nausea or diarrhea.  We discussed self-isolation per CDC  guidelines once discharged.   Objective   Vitals:   05/04/19 0400 05/04/19 0411 05/04/19 0449 05/04/19 0524  BP: (!) 197/80 (!) 193/75 (!) 184/72 (!) 170/68  Pulse:      Resp: (!) 22 (!) 23 (!) 26 (!) 23  Temp: 98.1 F (36.7 C)     TempSrc: Oral     SpO2: 95% 94% 95% 94%  Weight: 89.4 kg     Height:        Intake/Output Summary (Last 24 hours) at 05/04/2019 0732 Last data filed at 05/04/2019 0400 Gross per 24 hour  Intake 1445.38 ml  Output 300 ml  Net 1145.38 ml   Filed Weights   05/02/19 0932 05/03/19 0500 05/04/19 0400  Weight: 86.2 kg 88.1 kg 89.4 kg    Examination:  Physical Exam Vitals and nursing note reviewed.  Constitutional:      Appearance: Normal appearance.  HENT:     Head: Normocephalic and atraumatic.     Nose: Nose normal.     Mouth/Throat:     Mouth: Mucous membranes are moist.  Eyes:     Extraocular Movements: Extraocular movements intact.  Cardiovascular:     Rate and Rhythm: Normal rate and regular rhythm.  Pulmonary:     Effort: Pulmonary effort is normal.     Breath sounds: Normal breath sounds.  Abdominal:     General: Abdomen is flat.     Palpations: Abdomen is soft.  Musculoskeletal:        General: No swelling.     Cervical back: Normal range of motion. No rigidity.     Right lower leg: No edema.     Left lower leg: No edema.  Neurological:     General: No focal deficit present.     Mental Status: He is alert. Mental status is at baseline.  Psychiatric:        Mood and Affect: Mood normal.        Behavior: Behavior normal.     Data Reviewed: I have personally reviewed following labs and imaging studies  CBC: Recent Labs  Lab 05/02/19 0939 05/02/19 0953 05/03/19 0111  WBC 8.8  --  8.9  HGB 9.4*  8.8* 9.2* 8.9*  HCT 29.5*  26.0* 27.0* 27.7*  MCV 92.2  --  90.2  PLT 305  --  465   Basic Metabolic Panel: Recent Labs  Lab 05/02/19 0939 05/02/19 0953 05/02/19 1742 05/03/19 0111 05/04/19 0320  NA 135  135 135  127* 132* 126*  K 3.3*  3.0* 3.0* 4.0 3.8 4.2  CL 96*  100 100 91* 95* 89*  CO2 24  --  24 25 19*  GLUCOSE 123*  112* 111* 97 115* 171*  BUN 18  16 16  24* 27* 39*  CREATININE 2.62*  2.40* 2.40* 3.56* 4.01* 5.65*  CALCIUM 7.7*  --  7.8* 7.8* 7.6*  MG 1.9  --   --  1.9 1.9  PHOS  --   --   --  4.6 5.4*   GFR: Estimated Creatinine Clearance: 14.1 mL/min (A) (by C-G formula based on SCr of 5.65 mg/dL (H)). Liver Function Tests: Recent Labs  Lab 05/03/19 0111  AST 37  ALT 14  ALKPHOS 83  BILITOT 1.0  PROT 6.5  ALBUMIN 2.2*   No results for input(s): LIPASE, AMYLASE in the last 168 hours. No results for input(s): AMMONIA in the last 168 hours. Coagulation Profile: No results for input(s): INR, PROTIME in the last 168 hours. Cardiac Enzymes: No results for input(s): CKTOTAL, CKMB, CKMBINDEX, TROPONINI in the last 168 hours. BNP (last 3 results) No results for input(s): PROBNP in the last 8760 hours. HbA1C: Recent Labs    05/02/19 1742  HGBA1C 5.6   CBG: Recent Labs  Lab 05/02/19 2246 05/03/19 0647 05/03/19 1136 05/03/19 1706 05/03/19 2301  GLUCAP 129* 108* 96 178* 218*   Lipid Profile: Recent Labs    05/03/19 0111  CHOL 80  HDL 21*  LDLCALC 48  TRIG 56  CHOLHDL 3.8   Thyroid Function Tests: No results for input(s): TSH, T4TOTAL, FREET4, T3FREE, THYROIDAB in the last 72 hours. Anemia Panel: Recent Labs    05/03/19 0111 05/04/19 0320  FERRITIN >7,500* 4,177*   Sepsis Labs: Recent Labs  Lab 05/02/19 1742  PROCALCITON 0.31    Recent Results (from the past 240 hour(s))  Respiratory Panel by RT PCR (Flu A&B, Covid) - Nasopharyngeal Swab     Status: Abnormal   Collection Time: 05/02/19  9:33 AM   Specimen: Nasopharyngeal Swab  Result Value Ref Range Status   SARS Coronavirus 2 by RT PCR POSITIVE (A) NEGATIVE Final    Comment: RESULT CALLED TO, READ BACK BY AND VERIFIED WITH: Cath [facility] 11:25 05/02/19 ([email or web address]) (NOTE) SARS-CoV-2 target nucleic  acids are DETECTED. SARS-CoV-2 RNA is generally detectable in upper respiratory specimens  during the acute phase of infection. Positive results are indicative of the presence of the identified virus, but do not rule out bacterial infection or co-infection with other pathogens not detected by the test. Clinical correlation with patient history and other diagnostic information is necessary to determine patient infection status. The expected result is Negative. Fact Sheet for Patients:  PinkCheek.be Fact Sheet for Healthcare Providers: GravelBags.it This test is not yet approved or cleared by the [place] FDA and  has been authorized for detection and/or diagnosis of SARS-CoV-2 by FDA under an Emergency Use Authorization (EUA).  This EUA will remain in effect (meaning this test can be used) for t he duration of  the COVID-19 declaration under Section 564(b)(1) of the Act, 21 U.S.C. section 360bbb-3(b)(1), unless the authorization is terminated or revoked sooner.    Influenza A by PCR NEGATIVE NEGATIVE Final   Influenza B by PCR NEGATIVE NEGATIVE Final    Comment: (NOTE) The Xpert Xpress SARS-CoV-2/FLU/RSV assay is intended as an aid in  the diagnosis of influenza from Nasopharyngeal swab specimens and  should not be used as a sole basis for treatment. Nasal washings and  aspirates are unacceptable for Xpert Xpress SARS-CoV-2/FLU/RSV  testing. Fact Sheet for Patients: PinkCheek.be Fact Sheet for Healthcare Providers: GravelBags.it This test is not yet approved or cleared by the [place] FDA and  has been authorized for detection and/or diagnosis of SARS-CoV-2 by  FDA under an Emergency Use Authorization (EUA). This EUA will remain  in effect (meaning this test can be used) for the duration of the  Covid-19 declaration under Section 564(b)(1) of the Act, 21   U.S.C. section  360bbb-3(b)(1), unless the authorization is  terminated or revoked. Performed at [facility], [place] [address] [place], [place] [address]   MRSA PCR Screening     Status: None   Collection Time: 05/02/19  2:56 PM   Specimen: Nasopharyngeal  Result Value Ref Range Status   MRSA by PCR NEGATIVE NEGATIVE Final    Comment:        The GeneXpert MRSA Assay (FDA approved for NASAL specimens only), is one component of a comprehensive MRSA colonization surveillance program. It is not intended to diagnose MRSA infection nor to guide or monitor treatment for MRSA infections. Performed at [facility], [place] [address], [place], [place] [address]          Radiology Studies: CARDIAC CATHETERIZATION  Result Date: 05/02/2019 Conclusions: 1. Multivessel coronary artery disease including 50-60% distal LMCA stenosis, 30% proximal LAD and 50% ostial D1 lesions, and chronic total occlusions of distal LCx and long stented segment of the proximal through distal RCA.  Occlusions of the LCx and RCA are known to be chronic based on prior outside catheterization reports. 2. Severely reduced left ventricular systolic function (LVEF ~36%) with mildly elevated filling pressure. Recommendations: 1. Medical therapy of worsening cardiomyopathy complicated by wide-complex tachycardia.  Recommend formal consultation with EP. 2. Medical therapy of left main disease and LCx/RCA chronic total occlusions in the setting of acute comorbidities.  When patient has been optimized from a heart failure standpoint and COVID-19 resolved, revascularization options may need to be readdressed. 3. Optimize evidence-based heart failure therapy. 4. Consultation with internal medicine and/or critical care medicine to assist with management of COVID-19 infection. Nelva Bush, MD [facility]   DG Chest Port 1 View  Result Date: 05/02/2019 CLINICAL DATA:  Chest pain EXAM: PORTABLE CHEST 1 VIEW COMPARISON:   May 08, 2018 FINDINGS: No edema or consolidation. There is cardiomegaly with pulmonary vascularity normal. No adenopathy. There is aortic atherosclerosis. No bone lesions. No pneumothorax. IMPRESSION: Cardiomegaly. Lungs clear. No adenopathy. Aortic Atherosclerosis (ICD10-I70.0). Electronically Signed   By: Lowella Grip III M.D.   On: 05/02/2019 09:47        Scheduled Meds: . allopurinol  100 mg Oral QHS  . amiodarone  400 mg Oral BID  . vitamin C  500 mg Oral Daily  . aspirin  81 mg Oral Daily  . atorvastatin  40 mg Oral QPM  . [START ON 05/05/2019] calcitRIOL  0.5 mcg Oral Q M,W,F-HD  . Chlorhexidine Gluconate Cloth  6 each Topical Daily  . clopidogrel  75 mg Oral Q supper  . dexamethasone (DECADRON) injection  6 mg Intravenous Q24H  . enoxaparin (LOVENOX) injection  40 mg Subcutaneous Q12H  . hydrALAZINE  25 mg Oral Q8H  . insulin aspart  0-15 Units Subcutaneous TID WC  . insulin glargine  15 Units Subcutaneous BID  . Ipratropium-Albuterol  1 puff Inhalation Q6H  . latanoprost  1 drop Both Eyes QHS  . metoprolol tartrate  25 mg Oral Daily  . sodium chloride flush  3 mL Intravenous Q12H  . sodium chloride flush  3 mL Intravenous Q12H  . zinc sulfate  220 mg Oral Daily   Continuous Infusions: . sodium chloride Stopped (05/03/19 1951)  . sodium chloride    . remdesivir 100 mg in NS 100 mL       LOS: 2 days    Time spent: 25 minutes with over 50% of the time coordinating the patient's care    Kevan Ny E  Neysa Bonito, DO [facility] Pager [phone]  If 7PM-7AM, please contact night-coverage [email or web address] Password [facility] 05/04/2019, 7:32 AM

## 2019-05-04 NOTE — Progress Notes (Signed)
Paged Turner MD regarding pt's BP. Awaiting call back and further orders.

## 2019-05-04 NOTE — Progress Notes (Signed)
[facility] Progress Note  Name: James Chan MRN: [id number] DOB: December 09, 1950  Chief Complaint:  Chest pressure  Subjective:  Patient seen via window and discussed with nurse.  Due to the nature of this patient's [id number] with isolation and in keeping with efforts to prevent the spread of infection and to conserve personal protective equipment, a physical exam was not personally performed.  Patient's symptoms and exam were discussed in detail with the RN.  A chart review of other providers notes and the patient's lab work as well as review of other pertinent studies was performed.  Exam details from prior documentation were reviewed specifically and confirmed with the bedside nurse.  Location of service: [facility]   Has been doing better with his fluids.  He has been weaned to room air without difficulty.  Had HD on 1/8 as an outpatient. Had 300 mL UOP with IV lasix on 1/9  Review of systems: discussed with nursing  No chest pain  No n/v No shortness of breath   ------------------- Background on consult:  James Chan is a 69 y.o. male history of end-stage renal disease on hemodialysis Monday Wednesday Friday at [facility] who presented to the ER with chest pressure.  He developed chest discomfort on dialysis and treatment was terminated early and he presented to the ER.  He had an abnormal EKG and ultimately underwent heart cath.  There was no new acute change noted with chronic disease appreciated.  He was also found to be Covid positive.  He received about 2 hours of dialysis prior to treatment termination due to the chest pain, concern for arrhythmia, and hypotension.  Note that the treatment prior he left below dry weight at 86.7 kg.  Course earlier today was complicated by what appeared to be A. fib with RVR per EP.  He was started on amiodarone.  He feels as though he would be fine to wait until Monday for his next HD.  He denies any current chest pain  shortness of breath nausea or vomiting.  He has some abdominal discomfort.  No vision changes.  No dizziness or cramping.   Intake/Output Summary (Last 24 hours) at 05/04/2019 3419 Last data filed at 05/04/2019 0400 Gross per 24 hour  Intake 1445.38 ml  Output 300 ml  Net 1145.38 ml    Vitals:  Vitals:   05/04/19 0400 05/04/19 0411 05/04/19 0449 05/04/19 0524  BP: (!) 197/80 (!) 193/75 (!) 184/72 (!) 170/68  Pulse:      Resp: (!) 22 (!) 23 (!) 26 (!) 23  Temp: 98.1 F (36.7 C)     TempSrc: Oral     SpO2: 95% 94% 95% 94%  Weight: 89.4 kg     Height:         Physical Exam:  Examined from window and spoke with nursing bp 167/68 General: Adult male in bed in no acute distress at rest HEENT: Normocephalic atraumatic Neck: Supple trachea midline Heart: RRR per nursing Lungs: lungs clear to auscultation per nursing.  Unlabored at rest on room air Abdomen: Soft distended/obese habitus and nontender. Extremities: no edema appreciated  Neuro: Alert and oriented and follows commands provides a history Psych normal mood and affect Access right upper extremity AV fistula with bruit and thrill  Medications reviewed   Labs:  BMP Latest Ref Rng & Units 05/03/2019 05/02/2019 05/02/2019  Glucose 70 - 99 mg/dL 115(H) 97 111(H)  BUN 8 - 23 mg/dL 27(H) 24(H) 16  Creatinine 0.61 -  1.24 mg/dL 4.01(H) 3.56(H) 2.40(H)  BUN/Creat Ratio 10 - 24 - - -  Sodium 135 - 145 mmol/L 132(L) 127(L) 135  Potassium 3.5 - 5.1 mmol/L 3.8 4.0 3.0(L)  Chloride 98 - 111 mmol/L 95(L) 91(L) 100  CO2 22 - 32 mmol/L 25 24 -  Calcium 8.9 - 10.3 mg/dL 7.8(L) 7.8(L) -    Outpatient hemodialysis orders MWF at [facility] 4hr, 400/800, EDW 87.3kg, 2K/2.25Ca, UF profile 2, AVF, heparin 2000 bolus - Calcitriol 0.80mcg PO q HD - No current ESA orders - on hold, last given Mircera 78mcg on 04/09/19  Last HD: 1/8 - approx 2 hours of treatment prior to EMS being called for CP, arrhythmia, hypotension 1/6 - left  below EDW at 86.7kg    Assessment/Plan:  # Coronary artery disease with chest pain - NSTEMI/unstable angina -s/p cath. per cardiology  # End-stage renal disease on hemodialysis - Normally HD is per Monday Wednesday Friday schedule - We will assess dialysis needs daily - HD per MWF schedule   #COVID-19 infection - Therapies per primary team - dexamethasone and remdesivir  # HTN with hx afib - on his metoprolol and have added back his home hydralazine    # Chronic heart failure with preserved EF -Appears compensated on exam - Lasix 80 mg IV once today - repeat   - fluid restriction 1.2 liters  # Hypokalemia - await BMP  # Anemia of CKD - last had mircera 60 mcg on 04/09/19  - NSTEMI this admission - defer ESA today - Would give PRBC's if Hb below 7 (8 if desired goal per cardiology)   # Secondary hyperpara - continue calcitriol; no binders on home list  Claudia Desanctis, MD 05/04/2019 6:05 AM

## 2019-05-04 NOTE — Plan of Care (Signed)
Pt is alert and oriented, on room air, no shortness of breath noted throughout the night. Pt verbalizes understanding of medications given. Pt has been NSR, SB, but hypertensive. Pt is able to move all extremities and gets up with minimal assistance. Pt's appetite has improved. Call bell is within reach and bed is in lowest position. Problem: Education: Goal: Knowledge of General Education information will improve Description: Including pain rating scale, medication(s)/side effects and non-pharmacologic comfort measures Outcome: Progressing   Problem: Clinical Measurements: Goal: Ability to maintain clinical measurements within normal limits will improve Outcome: Progressing Goal: Respiratory complications will improve Outcome: Progressing Goal: Cardiovascular complication will be avoided Outcome: Progressing   Problem: Activity: Goal: Risk for activity intolerance will decrease Outcome: Progressing   Problem: Nutrition: Goal: Adequate nutrition will be maintained Outcome: Progressing

## 2019-05-05 DIAGNOSIS — N186 End stage renal disease: Secondary | ICD-10-CM

## 2019-05-05 DIAGNOSIS — I480 Paroxysmal atrial fibrillation: Secondary | ICD-10-CM

## 2019-05-05 LAB — POCT I-STAT, CHEM 8
BUN: 16 mg/dL (ref 8–23)
Calcium, Ion: 0.83 mmol/L — CL (ref 1.15–1.40)
Chloride: 100 mmol/L (ref 98–111)
Creatinine, Ser: 2.4 mg/dL — ABNORMAL HIGH (ref 0.61–1.24)
Glucose, Bld: 111 mg/dL — ABNORMAL HIGH (ref 70–99)
HCT: 27 % — ABNORMAL LOW (ref 39.0–52.0)
Hemoglobin: 9.2 g/dL — ABNORMAL LOW (ref 13.0–17.0)
Potassium: 3 mmol/L — ABNORMAL LOW (ref 3.5–5.1)
Sodium: 135 mmol/L (ref 135–145)
TCO2: 24 mmol/L (ref 22–32)

## 2019-05-05 LAB — FERRITIN: Ferritin: 2898 ng/mL — ABNORMAL HIGH (ref 24–336)

## 2019-05-05 LAB — RENAL FUNCTION PANEL
Albumin: 2.4 g/dL — ABNORMAL LOW (ref 3.5–5.0)
Anion gap: 12 (ref 5–15)
BUN: 56 mg/dL — ABNORMAL HIGH (ref 8–23)
CO2: 25 mmol/L (ref 22–32)
Calcium: 7.4 mg/dL — ABNORMAL LOW (ref 8.9–10.3)
Chloride: 88 mmol/L — ABNORMAL LOW (ref 98–111)
Creatinine, Ser: 7.47 mg/dL — ABNORMAL HIGH (ref 0.61–1.24)
GFR calc Af Amer: 8 mL/min — ABNORMAL LOW
GFR calc non Af Amer: 7 mL/min — ABNORMAL LOW
Glucose, Bld: 149 mg/dL — ABNORMAL HIGH (ref 70–99)
Phosphorus: 6.4 mg/dL — ABNORMAL HIGH (ref 2.5–4.6)
Potassium: 4.2 mmol/L (ref 3.5–5.1)
Sodium: 125 mmol/L — ABNORMAL LOW (ref 135–145)

## 2019-05-05 LAB — GLUCOSE, CAPILLARY
Glucose-Capillary: 149 mg/dL — ABNORMAL HIGH (ref 70–99)
Glucose-Capillary: 161 mg/dL — ABNORMAL HIGH (ref 70–99)

## 2019-05-05 LAB — D-DIMER, QUANTITATIVE: D-Dimer, Quant: 1.29 ug/mL-FEU — ABNORMAL HIGH (ref 0.00–0.50)

## 2019-05-05 LAB — C-REACTIVE PROTEIN: CRP: 10.6 mg/dL — ABNORMAL HIGH (ref <1.0)

## 2019-05-05 LAB — MAGNESIUM: Magnesium: 2 mg/dL (ref 1.7–2.4)

## 2019-05-05 MED ORDER — IPRATROPIUM-ALBUTEROL 20-100 MCG/ACT IN AERS
1.0000 | INHALATION_SPRAY | RESPIRATORY_TRACT | Status: DC | PRN
  Filled 2019-05-05: qty 4

## 2019-05-05 MED ORDER — HEPARIN SODIUM (PORCINE) 5000 UNIT/ML IJ SOLN: 5000.0000 [IU] | Freq: Three times a day (TID) | INTRAMUSCULAR | Status: DC

## 2019-05-05 MED ORDER — CLONIDINE HCL 0.1 MG PO TABS: 0.1000 mg | ORAL_TABLET | Freq: Two times a day (BID) | ORAL | 11 refills | Status: DC | PRN

## 2019-05-05 MED ORDER — ASPIRIN 81 MG PO CHEW: 81.0000 mg | CHEWABLE_TABLET | Freq: Every day | ORAL | 3 refills | Status: DC

## 2019-05-05 MED ORDER — CALCITRIOL 0.5 MCG PO CAPS
ORAL_CAPSULE | ORAL | Status: AC
  Filled 2019-05-05: qty 1

## 2019-05-05 NOTE — Discharge Summary (Signed)
Physician Discharge Summary  James Chan KVQ:[id number] DOB: 10/26/1950 DOA: 05/02/2019  PCP: Cher Nakai, MD  Admit date: 05/02/2019 Discharge date: 05/05/2019   Code Status: Full Code  Admitted From: Home Discharged to: Steward: No Equipment/Devices: No Discharge Condition: Stable  Recommendations for Outpatient Follow-up   1. Has 2 remaining outpatient remdesivir infusions scheduled for 8:30 AM 1/12 and 1/13 2. To have close follow-up with cardiology 3. Must continue to self isolate per CDC guidelines due to COVID-19 4. He has been scheduled for HD [facility] on TTS schedule starting at 12 PM tomorrow, 11/12.  Hospital Summary  Thisis an 69 y.o.maleWithpast medical history significant for CADs/p PCI, PAF, ESRD on HD(M/W/F), diastolic CHF,PVD, diabetes mellitus type 2, ands/p CEA. Patient presented with complaints of palpitations and dizziness whenever changing positions. He went to dialysis morning of admission and developed chest pain. Hewas found to be in a wide-complex tachycardia for which EMS was called. His heart rate was initially noted to be greater than 170 and was temporary hypotensive. Cardiology formally admitted the patient on admission as he was noted to have a mild elevation in his troponin 21and for ongoing chest pain.  Troponin increased to 3146 and BNP 2378. Patient was taken for cardiac cath. Patient was incidentally noted to be positive for COVID-19.  Internal medicine was consulted for COVID-19 management.  He reported that he has been coughing over the last 2 weeks,but the cough has been improving this week. He reports chronically being short of breath,but does not require oxygen. O2 saturations were noted to be maintained on room air. Chest x-ray significant for cardiomegaly with clear lungs. Further evaluation of the wide-complex tachycardia by EP cardiology appeared to show atrial fibrillation with RVR with aberrant  conduction and patient was started on amiodarone drip.    05/02/18  amiodarone drip changed to p.o. and reduce to 200 mg daily.  Patient noted to be on 2L O2 via [place], CRP 19. Started on Dexamethasone and Remdesivir and Actemra  1/11 patient had significant improvement in his symptoms and was discharged after dialysis and recommended close follow-up.   To limit staff exposure to a COVID-19 positive patient, internal medicine has agreed to discharge this patient to allow for cardiology follow-up via telemedicine   A & P   Principal Problem:   Unstable angina ([place]) Active Problems:   PAF (paroxysmal atrial fibrillation) ([facility])   Wide-complex tachycardia ([place])   COVID-19 virus infection    1. Unstable angina/NSTEMI 1. Troponin increased to 3146 and BNP 2378. a. LHC 05/02/2019: Multivessel CAD b. Medical therapy and further plan for cardiology outpatient c. Aspirin 81 mg, Plavix 75 mg daily, Lopressor 25 mg twice daily, amiodarone 200 mg daily 2. Acute hypoxic respiratory failure secondary to COVID-19 a. Now on room air  b. CRP trending down, D-dimer stable c. dexamethasone Day 3, remdesivir day 3/5, S/p Actemra x 1 (1/19) d. Continue vitamins  e. Continue antitussives f. Since he has been tolerating room air, he does not need to be discharged with steroids but would recommend that he complete his 5 day treatment of remdesivir. I have arranged for him to complete these treatments tomorrow and Wednesday g. He will need to continue self-isolation per CDC guidelines which I discussed with him and will attach to his discharge paperwork 3. ESRD on HD Monday, Wednesday, Friday a. Completed HD 1/11 prior to discharge b. Scheduled on TTS HD schedule at [facility] starting  tomorrow 4. Wide QRS tachycardia/probable atrial fibrillation with RVR and aberrant conduction, currently rate controlled a. Seen by EP cardio b. Amiodarone drip on admission, now on Amiodarone PO 200 mg  daily and Lopressor 25 mg p.o. twice daily c. Follow-up outpatient 5. Diabetes 1. Continue outpatient regimen 6. Chronic combined systolic and diastolic heart failure a. EF 40 to 45% on echo 12/30, EF about 30% on LHC 1/8 b. Received IV diuresis while hospitalized, now euvolemic and set up for dialysis 3 times weekly c. Discharged without Lasix at this time but recommended close follow-up and reevaluate volume 7. Hyponatremia 1. Per nephro 8. Hypokalemia a. resolved 9. Anemia of CKD 1. Stable, follow-up outpatient   Consultants  . Cardiology . EP cardiology . Nephrology . Internal medicine  Procedures  . LHC 05/02/2019 . HD  Antibiotics   Anti-infectives (From admission, onward)   Start     Dose/Rate Route Frequency Ordered Stop   05/04/19 1000  remdesivir 100 mg in sodium chloride 0.9 % 100 mL IVPB     100 mg 200 mL/hr over 30 Minutes Intravenous Daily 05/03/19 0735 05/08/19 0959   05/03/19 1000  remdesivir 200 mg in sodium chloride 0.9% 250 mL IVPB     200 mg 580 mL/hr over 30 Minutes Intravenous Once 05/03/19 0735 05/03/19 1314        Subjective  Patient seen and examined at bedside prior to HD treatment in no acute distress and resting comfortably.  No events overnight.  Tolerating diet. In good spirits and anticipating discharge.   Denies any chest pain, shortness of breath, fever, nausea, vomiting, urinary or bowel complaints. Otherwise ROS negative   Objective   Discharge Exam: Vitals:   05/05/19 1350 05/05/19 1430  BP: (!) 147/65 (!) 148/63  Pulse: (!) 49 (!) 52  Resp: 18 17  Temp:    SpO2:     Vitals:   05/05/19 1335 05/05/19 1345 05/05/19 1350 05/05/19 1430  BP: (!) 145/64 (!) 146/60 (!) 147/65 (!) 148/63  Pulse: (!) 52 (!) 49 (!) 49 (!) 52  Resp: _0 Temp: 97.6 F (36.4 C)     TempSrc: Oral     SpO2: 95%     Weight: 93.9 kg     Height:        Physical Exam Vitals and nursing note reviewed.  Constitutional:      Appearance:  Normal appearance.  HENT:     Head: Normocephalic and atraumatic.     Nose: Nose normal.     Mouth/Throat:     Mouth: Mucous membranes are moist.  Eyes:     Extraocular Movements: Extraocular movements intact.  Cardiovascular:     Rate and Rhythm: Normal rate and regular rhythm.  Pulmonary:     Effort: Pulmonary effort is normal.     Breath sounds: Normal breath sounds.  Abdominal:     General: Abdomen is flat.     Palpations: Abdomen is soft.  Musculoskeletal:        General: No swelling. Normal range of motion.     Cervical back: Normal range of motion. No rigidity.  Neurological:     General: No focal deficit present.     Mental Status: He is alert. Mental status is at baseline.  Psychiatric:        Mood and Affect: Mood normal.        Behavior: Behavior normal.       The results of significant diagnostics from this hospitalization (  including imaging, microbiology, ancillary and laboratory) are listed below for reference.     Microbiology: Recent Results (from the past 240 hour(s))  Respiratory Panel by RT PCR (Flu A&B, Covid) - Nasopharyngeal Swab     Status: Abnormal   Collection Time: 05/02/19  9:33 AM   Specimen: Nasopharyngeal Swab  Result Value Ref Range Status   SARS Coronavirus 2 by RT PCR POSITIVE (A) NEGATIVE Final    Comment: RESULT CALLED TO, READ BACK BY AND VERIFIED WITH: Cath Lab 11:25 05/02/19 (wilsonm) (NOTE) SARS-CoV-2 target nucleic acids are DETECTED. SARS-CoV-2 RNA is generally detectable in upper respiratory specimens  during the acute phase of infection. Positive results are indicative of the presence of the identified virus, but do not rule out bacterial infection or co-infection with other pathogens not detected by the test. Clinical correlation with patient history and other diagnostic information is necessary to determine patient infection status. The expected result is Negative. Fact Sheet for Patients:   PinkCheek.be Fact Sheet for Healthcare Providers: GravelBags.it This test is not yet approved or cleared by the [place] FDA and  has been authorized for detection and/or diagnosis of SARS-CoV-2 by FDA under an Emergency Use Authorization (EUA).  This EUA will remain in effect (meaning this test can be used) for t he duration of  the COVID-19 declaration under Section 564(b)(1) of the Act, 21 U.S.C. section 360bbb-3(b)(1), unless the authorization is terminated or revoked sooner.    Influenza A by PCR NEGATIVE NEGATIVE Final   Influenza B by PCR NEGATIVE NEGATIVE Final    Comment: (NOTE) The Xpert Xpress SARS-CoV-2/FLU/RSV assay is intended as an aid in  the diagnosis of influenza from Nasopharyngeal swab specimens and  should not be used as a sole basis for treatment. Nasal washings and  aspirates are unacceptable for Xpert Xpress SARS-CoV-2/FLU/RSV  testing. Fact Sheet for Patients: PinkCheek.be Fact Sheet for Healthcare Providers: GravelBags.it This test is not yet approved or cleared by the [place] FDA and  has been authorized for detection and/or diagnosis of SARS-CoV-2 by  FDA under an Emergency Use Authorization (EUA). This EUA will remain  in effect (meaning this test can be used) for the duration of the  Covid-19 declaration under Section 564(b)(1) of the Act, 21  U.S.C. section 360bbb-3(b)(1), unless the authorization is  terminated or revoked. Performed at [facility], [place] [address], [place], [place] [address]   MRSA PCR Screening     Status: None   Collection Time: 05/02/19  2:56 PM   Specimen: Nasopharyngeal  Result Value Ref Range Status   MRSA by PCR NEGATIVE NEGATIVE Final    Comment:        The GeneXpert MRSA Assay (FDA approved for NASAL specimens only), is one component of a comprehensive MRSA colonization surveillance  program. It is not intended to diagnose MRSA infection nor to guide or monitor treatment for MRSA infections. Performed at [facility], [place] [address], [place], [place] [address]      Labs: BNP (last 3 results) Recent Labs    05/02/19 1742  BNP 0,940.7*   Basic Metabolic Panel: Recent Labs  Lab 05/02/19 0939 05/02/19 0953 05/02/19 1742 05/03/19 0111 05/04/19 0320 05/05/19 0647  NA 135  135 135  135 127* 132* 126* 125*  K 3.3*  3.0* 3.0*  3.0* 4.0 3.8 4.2 4.2  CL 96*  100 100  100 91* 95* 89* 88*  CO2 24  --  24 25 19* 25  GLUCOSE 123*  112* 111*  111* 97 115* 171* 149*  BUN _0 24* 27* 39* 56*  CREATININE 2.62*  2.40* 2.40*  2.40* 3.56* 4.01* 5.65* 7.47*  CALCIUM 7.7*  --  7.8* 7.8* 7.6* 7.4*  MG 1.9  --   --  1.9 1.9 2.0  PHOS  --   --   --  4.6 5.4* 6.4*   Liver Function Tests: Recent Labs  Lab 05/03/19 0111 05/05/19 0647  AST 37  --   ALT 14  --   ALKPHOS 83  --   BILITOT 1.0  --   PROT 6.5  --   ALBUMIN 2.2* 2.4*   No results for input(s): LIPASE, AMYLASE in the last 168 hours. No results for input(s): AMMONIA in the last 168 hours. CBC: Recent Labs  Lab 05/02/19 0939 05/02/19 0953 05/03/19 0111  WBC 8.8  --  8.9  HGB 9.4*  8.8* 9.2*  9.2* 8.9*  HCT 29.5*  26.0* 27.0*  27.0* 27.7*  MCV 92.2  --  90.2  PLT 305  --  317   Cardiac Enzymes: No results for input(s): CKTOTAL, CKMB, CKMBINDEX, TROPONINI in the last 168 hours. BNP: Invalid input(s): POCBNP CBG: Recent Labs  Lab 05/04/19 1117 05/04/19 1624 05/04/19 2059 05/05/19 0822 05/05/19 1139  GLUCAP 174* 204* 196* 149* 161*   D-Dimer Recent Labs    05/04/19 0320 05/05/19 0647  DDIMER 2.01* 1.29*   Hgb A1c Recent Labs    05/02/19 1742  HGBA1C 5.6   Lipid Profile Recent Labs    05/03/19 0111  CHOL 80  HDL 21*  LDLCALC 48  TRIG 56  CHOLHDL 3.8   Thyroid function studies No results for input(s): TSH, T4TOTAL, T3FREE, THYROIDAB in the last  72 hours.  Invalid input(s): FREET3 Anemia work up Recent Labs    05/04/19 0320 05/05/19 0647  FERRITIN 4,177* 2,898*   Urinalysis    Component Value Date/Time   COLORURINE STRAW (A) 05/08/2018 2225   APPEARANCEUR CLEAR 05/08/2018 2225   LABSPEC 1.009 05/08/2018 2225   PHURINE 5.0 05/08/2018 [place] 05/08/2018 2225   HGBUR NEGATIVE 05/08/2018 2225   [place] 05/08/2018 [place] 05/08/2018 2225   PROTEINUR 30 (A) 05/08/2018 2225   NITRITE NEGATIVE 05/08/2018 2225   LEUKOCYTESUR NEGATIVE 05/08/2018 2225   Sepsis Labs Invalid input(s): PROCALCITONIN,  WBC,  LACTICIDVEN Microbiology Recent Results (from the past 240 hour(s))  Respiratory Panel by RT PCR (Flu A&B, Covid) - Nasopharyngeal Swab     Status: Abnormal   Collection Time: 05/02/19  9:33 AM   Specimen: Nasopharyngeal Swab  Result Value Ref Range Status   SARS Coronavirus 2 by RT PCR POSITIVE (A) NEGATIVE Final    Comment: RESULT CALLED TO, READ BACK BY AND VERIFIED WITH: Cath Lab 11:25 05/02/19 (wilsonm) (NOTE) SARS-CoV-2 target nucleic acids are DETECTED. SARS-CoV-2 RNA is generally detectable in upper respiratory specimens  during the acute phase of infection. Positive results are indicative of the presence of the identified virus, but do not rule out bacterial infection or co-infection with other pathogens not detected by the test. Clinical correlation with patient history and other diagnostic information is necessary to determine patient infection status. The expected result is Negative. Fact Sheet for Patients:  PinkCheek.be Fact Sheet for Healthcare Providers: GravelBags.it This test is not yet approved or cleared by the [place] FDA and  has been authorized for detection and/or diagnosis of SARS-CoV-2  by FDA under an Emergency Use Authorization (EUA).  This EUA will remain in effect (meaning this  test can be used) for t he duration of  the COVID-19 declaration under Section 564(b)(1) of the Act, 21 U.S.C. section 360bbb-3(b)(1), unless the authorization is terminated or revoked sooner.    Influenza A by PCR NEGATIVE NEGATIVE Final   Influenza B by PCR NEGATIVE NEGATIVE Final    Comment: (NOTE) The Xpert Xpress SARS-CoV-2/FLU/RSV assay is intended as an aid in  the diagnosis of influenza from Nasopharyngeal swab specimens and  should not be used as a sole basis for treatment. Nasal washings and  aspirates are unacceptable for Xpert Xpress SARS-CoV-2/FLU/RSV  testing. Fact Sheet for Patients: PinkCheek.be Fact Sheet for Healthcare Providers: GravelBags.it This test is not yet approved or cleared by the [place] FDA and  has been authorized for detection and/or diagnosis of SARS-CoV-2 by  FDA under an Emergency Use Authorization (EUA). This EUA will remain  in effect (meaning this test can be used) for the duration of the  Covid-19 declaration under Section 564(b)(1) of the Act, 21  U.S.C. section 360bbb-3(b)(1), unless the authorization is  terminated or revoked. Performed at [facility], [place] [address] [place], [place] [address]   MRSA PCR Screening     Status: None   Collection Time: 05/02/19  2:56 PM   Specimen: Nasopharyngeal  Result Value Ref Range Status   MRSA by PCR NEGATIVE NEGATIVE Final    Comment:        The GeneXpert MRSA Assay (FDA approved for NASAL specimens only), is one component of a comprehensive MRSA colonization surveillance program. It is not intended to diagnose MRSA infection nor to guide or monitor treatment for MRSA infections. Performed at [facility], [place] [address], [place], [place] [address]     Discharge Instructions     Discharge Instructions    Diet - low sodium heart healthy   Complete by: As directed    Diet renal 60/70-05-26-1198   Complete by: As  directed    Discharge instructions   Complete by: As directed    You were seen in the hospital for COVID-19.  Upon discharge: -You have an appointment scheduled tomorrow and Wednesday at the [facility] infusion clinic for 2 remaining remdesivir infusions -Take aspirin 81 mg daily as well as clonidine 0.1 mg tablet by mouth 2 times daily as needed for blood pressure greater than 160, take 1 tablet on non dialysis days if pressure is greater than 160 as prescribed. -Continue taking amiodarone 200 mg daily -Continue taking metoprolol as prescribed -[facility] tomorrow at 11:45 AM  You are still contagious with COVID-19 and should self isolate at home.  Isolation can be discontinued when the following criteria are met:  At least 10 days have passed since symptoms first appeared AND You are at least 1 day (24 hours) since resolution of fever without the use of any fever reducing medications (Tylenol, Motrin, ibuprofen, etc.) AND There is improvement in your symptoms (ex. shortness of breath, cough, etc.)  If you have any questions do not hesitate to contact your primary care physician or return to the ED if worsening symptoms.   Increase activity slowly   Complete by: As directed    MyChart COVID-19 home monitoring program   Complete by: May 05, 2019    Is the patient willing to use the [facility] for home monitoring?: Yes   Temperature  monitoring   Complete by: May 05, 2019    After how many days would you like to receive a notification of this patient's flowsheet entries?: 1     Allergies as of 05/05/2019   No Known Allergies     Medication List    TAKE these medications   allopurinol 100 MG tablet Commonly known as: ZYLOPRIM Take 100 mg by mouth at bedtime.   amiodarone 200 MG tablet Commonly known as: PACERONE TAKE 1 TABLET BY MOUTH  DAILY   aspirin 81 MG chewable tablet Chew 1 tablet (81 mg total) by mouth daily. Start taking  on: May 06, 2019   atorvastatin 40 MG tablet Commonly known as: LIPITOR Take 1 tablet (40 mg total) by mouth every evening.   cloNIDine 0.1 MG tablet Commonly known as: CATAPRES Take 1 tablet (0.1 mg total) by mouth 2 (two) times daily as needed (if systolic blood pressure above 160 mmHg). Take 0.65m (one tablet) on non-hemodialysis days if systolic blood pressure is >160. What changed:   how much to take  how to take this  when to take this  reasons to take this  additional instructions   clopidogrel 75 MG tablet Commonly known as: PLAVIX Take 75 mg by mouth daily.   dicyclomine 10 MG capsule Commonly known as: BENTYL Take 10 mg by mouth every 6 (six) hours as needed.   hydrALAZINE 25 MG tablet Commonly known as: APRESOLINE Take 25 mg by mouth 3 (three) times daily.   latanoprost 0.005 % ophthalmic solution Commonly known as: XALATAN Place 1 drop into both eyes at bedtime.   lidocaine-prilocaine cream Commonly known as: EMLA Apply 1 application topically See admin instructions. Apply small amount to access site 1 to 2 hours before dialysis then cover with saran wrap.   metoprolol tartrate 25 MG tablet Commonly known as: LOPRESSOR Take 25 mg by mouth 2 (two) times daily.   nitroGLYCERIN 0.4 MG SL tablet Commonly known as: NITROSTAT Place 1 tablet (0.4 mg total) under the tongue every 5 (five) minutes as needed for chest pain.   oxyCODONE-acetaminophen 5-325 MG tablet Commonly known as: Percocet Take 1 tablet by mouth every 4 (four) hours as needed for severe pain.   polyethylene glycol 17 g packet Commonly known as: MIRALAX / GLYCOLAX Take 17 g by mouth daily as needed for mild constipation.   ProAir HFA 108 (90 Base) MCG/ACT inhaler Generic drug: albuterol Inhale 2 puffs into the lungs every 6 (six) hours as needed for wheezing or shortness of breath.   TOUJEO SOLOSTAR Crellin Inject 30 Units into the skin at bedtime.      Follow-up Information     MRichardo Priest MD Follow up on 05/26/2019.   Specialty: Cardiology Why: at 2:15 pm   Contact information: [place] [phone]         No Known Allergies  Time coordinating discharge: Over 30 minutes   SIGNED:   JHarold Hedge D.O. [facility] Pager: [phone] 05/05/2019, 3:54 PM

## 2019-05-05 NOTE — Progress Notes (Signed)
Renal Navigator notified by Nephrologist that patient states he is discharging after HD today. Navigator attempted to contact patient, but he was not in his room. 5W staff stated that he was in HD now. Renal Navigator attempted to contact wife, who did not answer phone number listed in Epic. Renal Navigator spoke with bedside RN to relay information to patient regarding OP HD COVID isolation shift, which is at [facility] on a TTS shift with a seat time of 12:00pm, 11:45am arrival. Patient should call when he arrives and wait to be called in to the clinic. He should start there tomorrow if discharged today. Navigator notified OP HD clinic.  [facility] [address] [place], [place]  Patient's wife called Navigator back and said that if information will be given to patient, she did not need to know it or write it down.   Alphonzo Cruise, [place] Renal Navigator [phone]

## 2019-05-05 NOTE — Progress Notes (Signed)
Progress Note  Due to the COVID-19 pandemic, this visit was completed with telemedicine (audio/video) technology to reduce patient and provider exposure as well as to preserve personal protective equipment.   Patient Name: James Chan Date of Encounter: 05/05/2019  Primary Cardiologist: Shirlee More, MD   Subjective   Patient feels well, is bored and hopes to go home soon. No recurrent chest pain  Inpatient Medications    Scheduled Meds: . allopurinol  100 mg Oral QHS  . amiodarone  200 mg Oral Daily  . vitamin C  500 mg Oral Daily  . aspirin  81 mg Oral Daily  . atorvastatin  40 mg Oral QPM  . calcitRIOL  0.5 mcg Oral Q M,W,F-HD  . Chlorhexidine Gluconate Cloth  6 each Topical Daily  . Chlorhexidine Gluconate Cloth  6 each Topical Q0600  . cloNIDine  0.1 mg Oral BID  . clopidogrel  75 mg Oral Q supper  . dexamethasone (DECADRON) injection  6 mg Intravenous Q24H  . heparin injection (subcutaneous)  5,000 Units Subcutaneous Q8H  . hydrALAZINE  25 mg Oral Q8H  . insulin aspart  0-15 Units Subcutaneous TID WC  . insulin glargine  15 Units Subcutaneous BID  . Ipratropium-Albuterol  1 puff Inhalation Q6H  . latanoprost  1 drop Both Eyes QHS  . metoprolol tartrate  25 mg Oral Daily  . sodium chloride flush  3 mL Intravenous Q12H  . sodium chloride flush  3 mL Intravenous Q12H  . zinc sulfate  220 mg Oral Daily   Continuous Infusions: . sodium chloride Stopped (05/03/19 1951)  . sodium chloride    . remdesivir 100 mg in NS 100 mL 100 mg (05/05/19 0929)   PRN Meds: sodium chloride, sodium chloride, acetaminophen, ALPRAZolam, chlorpheniramine-HYDROcodone, guaiFENesin-dextromethorphan, nitroGLYCERIN, ondansetron (ZOFRAN) IV, oxyCODONE-acetaminophen, polyethylene glycol, sodium chloride flush, sodium chloride flush, zolpidem   Vital Signs    Vitals:   05/05/19 0000 05/05/19 0002 05/05/19 0436 05/05/19 0907  BP:  (!) 163/65 (!) 161/67 (!) 166/62  Pulse:   (!) 54 (!) 56    Resp:  (!) 21    Temp: 98 F (36.7 C)  97.9 F (36.6 C)   TempSrc: Oral  Oral   SpO2:  95% 94%   Weight:      Height:        Intake/Output Summary (Last 24 hours) at 05/05/2019 1129 Last data filed at 05/05/2019 0929 Gross per 24 hour  Intake 934 ml  Output --  Net 934 ml   Last 3 Weights 05/04/2019 05/03/2019 05/02/2019  Weight (lbs) 197 lb 1.5 oz 194 lb 3.6 oz 190 lb  Weight (kg) 89.4 kg 88.1 kg 86.183 kg      Telemetry    SR- Personally Reviewed  ECG    Borderline prolonged QT when corrected for widened QRS - Personally Reviewed  Physical Exam   VITAL SIGNS:  reviewed GEN:  no acute distress PSYCH:  normal affect  Labs    Chemistry Recent Labs  Lab 05/03/19 0111 05/04/19 0320 05/05/19 0647  NA 132* 126* 125*  K 3.8 4.2 4.2  CL 95* 89* 88*  CO2 25 19* 25  GLUCOSE 115* 171* 149*  BUN 27* 39* 56*  CREATININE 4.01* 5.65* 7.47*  CALCIUM 7.8* 7.6* 7.4*  PROT 6.5  --   --   ALBUMIN 2.2*  --  2.4*  AST 37  --   --   ALT 14  --   --   ALKPHOS 83  --   --  BILITOT 1.0  --   --   GFRNONAA 15* 10* 7*  GFRAA 17* 11* 8*  ANIONGAP 12 18* 12     Hematology Recent Labs  Lab 05/02/19 0939 05/02/19 0953 05/03/19 0111  WBC 8.8  --  8.9  RBC 3.20*  --  3.07*  HGB 9.4*  8.8* 9.2* 8.9*  HCT 29.5*  26.0* 27.0* 27.7*  MCV 92.2  --  90.2  MCH 29.4  --  29.0  MCHC 31.9  --  32.1  RDW 14.3  --  14.9  PLT 305  --  317    Cardiac EnzymesNo results for input(s): TROPONINI in the last 168 hours. No results for input(s): TROPIPOC in the last 168 hours.   BNP Recent Labs  Lab 05/02/19 1742  BNP 2,378.8*     DDimer  Recent Labs  Lab 05/03/19 0111 05/04/19 0320 05/05/19 0647  DDIMER 2.18* 2.01* 1.29*     Radiology    No results found.  Cardiac Studies   ECHO:04/23/2019 1. Left ventricular ejection fraction, by visual estimation, is 40 to 45%. The left ventricle has mild to moderately decreased function with global hypokinesis. The left  ventricular cavity size is mildy dilated. There is moderate to severe increased eccentric left ventricular hypertrophy. 2. Left ventricular diastolic parameters are consistent with Grade II diastolic dysfunction (pseudonormalization). 3. The Global left ventricular strain is abnormal (-6.2%). 4. Global right ventricle has normal systolic function.The right ventricular size is normal. No increase in right ventricular wall thickness. 5. Left atrial size was mildly dilated. 6. Right atrial size was mildly dilated. 7. The mitral valve is normal in structure. Mild to moderate mitral valve regurgitation. No evidence of mitral stenosis. 8. The tricuspid valve is normal in structure. 9. The aortic valve is trileaflet and thickened. Aortic regurgitation is not visualized. Mild aortic valve sclerosis without stenosis. 10. The pulmonic valve was normal in structure. Pulmonic valve regurgitation is not visualized. 11. There is mild dilatation of the ascending aorta measuring 39 mm. 12. Mild Pulmonary Hypertension. Pulmonary artery systolic pressure 38 mmHg. 13. Compared to TTE done in July 2019, the Left ventricular ejection fraction is reduced from 50- 55% to now 40-45%. The mitral regurgitation is no longer moderate to severe.  Diagnostic Dominance: Right  1. Multivessel coronary artery disease including 50-60% distal LMCA stenosis, 30% proximal LAD and 50% ostial D1 lesions, and chronic total occlusions of distal LCx and long stented segment of the proximal through distal RCA. Occlusions of the LCx and RCA are known to be chronic based on prior outside catheterization reports. 2. Severely reduced left ventricular systolic function (LVEF ~53%) with mildly elevated filling pressure.   Patient Profile     69 y.o. 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  Covid positive.   Assessment & Plan    Discussed with Dr. Neysa Bonito who has graciously agreed to participate in the patient's hospital dismissal to reduce provider exposure. Discharge physical exam per Dr. Neysa Bonito.   UNSTABLE ANGINA/NSTEMI:   Continue ASA 81 mg daily, atorvastatin 40 mg daily, clopidogrel 75 mg daily, metoprolol tartrate 25 mg daily.  Medical management of CAD.   No recurrent chest pain.   WIDE COMPLEX TACHYCARDIA:  Seen  by EP.  Probable atrial fib with RVR and aberrant conduction.  Now maintaining NSR.   Stopped IV amiodarone yesterday and on PO.  I will reduce to 200 mg daily as that was a previous dose.   Continue amiodarone 200 mg daily   ESRD:  Dialysis planned again for Monday.  Per Nephrology.   COVID:  Per hospitalists.  Greatly appreciate their assistance.   HTN:  BP elevated. Patient takes clonidine as needed at home for SBP >160 mmHg. He would like to stay on this plan prior to introducing new therapies. Consider addition of long acting nitrate if patient can tolerate in future. Labile Bps limiting factor.   ANEMIA OF CHRONIC DISEASE: Stable.  No indication for transfusion at this point.     HYPOKALEMIA:  Normal past 2 days.   Borderline prolonged QTc - around 500 when corrected.  Reviewing medications with pharmacist prior to hospital dismissal.   For questions or updates, please contact [place] Please consult [email or web address] for contact info under     Signed, Elouise Munroe, MD  05/05/2019, 11:29 AM

## 2019-05-05 NOTE — Progress Notes (Signed)
Patient scheduled for outpatient Remdesivir infusion at 0830AM on Tuesday 1/12 and Wednesday 1/13.  Please advise them to report to [facility] at [address]  Drive to the security guard and tell them you are here for an infusion. They will direct you to the front entrance where we will come and get you.  For questions call [phone].  Thanks

## 2019-05-05 NOTE — Discharge Instructions (Signed)
You are scheduled for an outpatient infusion of Remdesivir at 0830AM on Tuesday 1/12 and Wednesday 1/13. James Chan  Please report to [facility] at [address]  Drive to the security guard and tell them you are here for an infusion. They will direct you to the front entrance where we will come and get you.  For questions call [phone].  Thanks   You will be contacted by Dr. Pernell Dupre office for an appointment in the next 2-3 days for follow up. If you are not contacted please call the office.   You are scheduled for 12 PM, 11:45 AM arrival at [facility] clinic on a Tuesday, Thursday, Saturday shift.  You should call when you arrives and wait to be called in to the clinic.  You should start there tomorrow.   [facility] [address], [place], [place]

## 2019-05-05 NOTE — Progress Notes (Signed)
Nsg Discharge Note  Admit Date:  05/02/2019 Discharge date: 05/05/2019   James Chan to be D/C'd Home per MD order.  AVS completed.  Copy for chart, and copy for patient signed, and dated. Patient/caregiver able to verbalize understanding.  Discharge Medication: Allergies as of 05/05/2019   No Known Allergies     Medication List    TAKE these medications   allopurinol 100 MG tablet Commonly known as: ZYLOPRIM Take 100 mg by mouth at bedtime.   amiodarone 200 MG tablet Commonly known as: PACERONE TAKE 1 TABLET BY MOUTH  DAILY   aspirin 81 MG chewable tablet Chew 1 tablet (81 mg total) by mouth daily. Start taking on: May 06, 2019   atorvastatin 40 MG tablet Commonly known as: LIPITOR Take 1 tablet (40 mg total) by mouth every evening.   cloNIDine 0.1 MG tablet Commonly known as: CATAPRES Take 1 tablet (0.1 mg total) by mouth 2 (two) times daily as needed (if systolic blood pressure above 160 mmHg). Take 0.1mg  (one tablet) on non-hemodialysis days if systolic blood pressure is >160. What changed:   how much to take  how to take this  when to take this  reasons to take this  additional instructions   clopidogrel 75 MG tablet Commonly known as: PLAVIX Take 75 mg by mouth daily.   dicyclomine 10 MG capsule Commonly known as: BENTYL Take 10 mg by mouth every 6 (six) hours as needed.   hydrALAZINE 25 MG tablet Commonly known as: APRESOLINE Take 25 mg by mouth 3 (three) times daily.   latanoprost 0.005 % ophthalmic solution Commonly known as: XALATAN Place 1 drop into both eyes at bedtime.   lidocaine-prilocaine cream Commonly known as: EMLA Apply 1 application topically See admin instructions. Apply small amount to access site 1 to 2 hours before dialysis then cover with saran wrap.   metoprolol tartrate 25 MG tablet Commonly known as: LOPRESSOR Take 25 mg by mouth 2 (two) times daily.   nitroGLYCERIN 0.4 MG SL tablet Commonly known as:  NITROSTAT Place 1 tablet (0.4 mg total) under the tongue every 5 (five) minutes as needed for chest pain.   oxyCODONE-acetaminophen 5-325 MG tablet Commonly known as: Percocet Take 1 tablet by mouth every 4 (four) hours as needed for severe pain.   polyethylene glycol 17 g packet Commonly known as: MIRALAX / GLYCOLAX Take 17 g by mouth daily as needed for mild constipation.   ProAir HFA 108 (90 Base) MCG/ACT inhaler Generic drug: albuterol Inhale 2 puffs into the lungs every 6 (six) hours as needed for wheezing or shortness of breath.   TOUJEO SOLOSTAR [place] Inject 30 Units into the skin at bedtime.       Discharge Assessment: Vitals:   05/05/19 1530 05/05/19 1545  BP: (!) 167/69 (!) 155/65  Pulse: (!) 52 (!) 56  Resp: 17 18  Temp:  97.7 F (36.5 C)  SpO2:  95%   Skin clean, dry and intact without evidence of skin break down, no evidence of skin tears noted. IV catheter discontinued intact. Site without signs and symptoms of complications - no redness or edema noted at insertion site, patient denies c/o pain - only slight tenderness at site.  Dressing with slight pressure applied.  D/c Instructions-Education: Discharge instructions given to patient/family with verbalized understanding. D/c education completed with patient/family including follow up instructions, medication list, d/c activities limitations if indicated, with other d/c instructions as indicated by MD - patient able to verbalize understanding, all questions  fully answered. Patient instructed to return to ED, call 911, or call MD for any changes in condition.  Patient escorted via [place], and D/C home via private auto.  [name] Margaretha Sheffield, RN 05/05/2019 6:10 PM

## 2019-05-05 NOTE — Progress Notes (Signed)
[facility] Progress Note  Name: James Chan MRN: [id number] DOB: 1951/02/06  Chief Complaint:  Chest pressure  Subjective: seen in room, excited to be "going home". Asking for 2 h HD today because needs to get a ride at 4 [address]m   Intake/Output Summary (Last 24 hours) at 05/05/2019 1359 Last data filed at 05/05/2019 0929 Gross per 24 hour  Intake 694 ml  Output --  Net 694 ml    Vitals:  Vitals:   05/05/19 0000 05/05/19 0002 05/05/19 0436 05/05/19 0907  BP:  (!) 163/65 (!) 161/67 (!) 166/62  Pulse:   (!) 54 (!) 56  Resp:  (!) 21    Temp: 98 F (36.7 C)  97.9 F (36.6 C)   TempSrc: Oral  Oral   SpO2:  95% 94%   Weight:      Height:         Physical Exam:  Examined from window and spoke with nursing bp 167/68 General: Adult male in bed in no acute distress at rest HEENT: Normocephalic atraumatic Neck: Supple trachea midline Heart: RRR per nursing Lungs: lungs clear to auscultation per nursing.  Unlabored at rest on room air Abdomen: Soft distended/obese habitus and nontender. Extremities: no edema appreciated  Neuro: Alert and oriented and follows commands Access right upper extremity AV fistula with bruit and thrill  Medications reviewed   Labs:  BMP Latest Ref Rng & Units 05/05/2019 05/04/2019 05/03/2019  Glucose 70 - 99 mg/dL 149(H) 171(H) 115(H)  BUN 8 - 23 mg/dL 56(H) 39(H) 27(H)  Creatinine 0.61 - 1.24 mg/dL 7.47(H) 5.65(H) 4.01(H)  BUN/Creat Ratio 10 - 24 - - -  Sodium 135 - 145 mmol/L 125(L) 126(L) 132(L)  Potassium 3.5 - 5.1 mmol/L 4.2 4.2 3.8  Chloride 98 - 111 mmol/L 88(L) 89(L) 95(L)  CO2 22 - 32 mmol/L 25 19(L) 25  Calcium 8.9 - 10.3 mg/dL 7.4(L) 7.6(L) 7.8(L)    Outpatient hemodialysis orders MWF at [facility] 4hr, 400/800, EDW 87.3kg, 2K/2.25Ca, UF profile 2, AVF, heparin 2000 bolus - Calcitriol 0.42mcg PO q HD - No current ESA orders - on hold, last given Mircera 56mcg on 04/09/19  Last HD: 1/8 - approx 2 hours  of treatment prior to EMS being called for CP, arrhythmia, hypotension 1/6 - left below EDW at 86.7kg    Assessment/Plan:  # Coronary artery disease with chest pain - NSTEMI/unstable angina -s/p cath. per cardiology  # End-stage renal disease on hemodialysis - Normally HD is per Monday Wednesday Friday schedule - Plan on HD this afternoon - Possibly may be going home today, our SW is evaluating for OP Covid HD shift   #COVID-19 infection - Therapies per primary team - dexamethasone and remdesivir  # HTN with hx afib - on his metoprolol and have added back his home hydralazine    # Chronic heart failure with preserved EF -Appears compensated on exam - Lasix 80 mg IV once today - repeat   - fluid restriction 1.2 liters  # Hypokalemia - await BMP  # Anemia of CKD - last had mircera 60 mcg on 04/09/19  - NSTEMI this admission  # Secondary hyperpara - continue calcitriol; no binders on home list  Sol Blazing, MD 05/05/2019 1:59 PM

## 2019-05-06 ENCOUNTER — Telehealth: Payer: Self-pay | Admitting: Nephrology

## 2019-05-06 ENCOUNTER — Ambulatory Visit (HOSPITAL_COMMUNITY)
Admission: RE | Admit: 2019-05-06 | Discharge: 2019-05-06 | Disposition: A | Discharge status: Home / Self Care | Payer: Medicare Other | Source: Ambulatory Visit | Attending: Pulmonary Disease | Admitting: Pulmonary Disease

## 2019-05-06 VITALS — BP 152/66 | HR 53 | Temp 97.5°F | Resp 18

## 2019-05-06 DIAGNOSIS — U071 COVID-19: Secondary | ICD-10-CM | POA: Diagnosis present

## 2019-05-06 MED ORDER — SODIUM CHLORIDE 0.9 % IV SOLN: INTRAVENOUS | Status: DC | PRN

## 2019-05-06 MED ORDER — DIPHENHYDRAMINE HCL 50 MG/ML IJ SOLN: 50.0000 mg | Freq: Once | INTRAMUSCULAR | Status: DC | PRN

## 2019-05-06 MED ORDER — METHYLPREDNISOLONE SODIUM SUCC 125 MG IJ SOLR: 125.0000 mg | Freq: Once | INTRAMUSCULAR | Status: DC | PRN

## 2019-05-06 MED ORDER — SODIUM CHLORIDE 0.9 % IV SOLN
INTRAVENOUS | Status: AC
  Filled 2019-05-06: qty 20

## 2019-05-06 MED ORDER — SODIUM CHLORIDE 0.9 % IV SOLN
100.0000 mg | Freq: Once | INTRAVENOUS | Status: AC
  Administered 2019-05-06: 100 mg via INTRAVENOUS

## 2019-05-06 MED ORDER — EPINEPHRINE 0.3 MG/0.3ML IJ SOAJ: 0.3000 mg | Freq: Once | INTRAMUSCULAR | Status: DC | PRN

## 2019-05-06 MED ORDER — ALBUTEROL SULFATE HFA 108 (90 BASE) MCG/ACT IN AERS: 2.0000 | INHALATION_SPRAY | Freq: Once | RESPIRATORY_TRACT | Status: DC | PRN

## 2019-05-06 MED ORDER — FAMOTIDINE IN NACL 20-0.9 MG/50ML-% IV SOLN: 20.0000 mg | Freq: Once | INTRAVENOUS | Status: DC | PRN

## 2019-05-06 NOTE — Progress Notes (Signed)
  Diagnosis: COVID-19   Physician: Dr. Joya Gaskins  Procedure: [facility] Infusion Clinic Med: remdesivir infusion.  Complications: No immediate complications noted.  Discharge: Discharged home   James Chan 05/06/2019

## 2019-05-06 NOTE — Discharge Instructions (Signed)
10 Things You Can Do to Manage Your COVID-19 Symptoms at Home If you have possible or confirmed COVID-19: 1. Stay home from work and school. And stay away from other public places. If you must go out, avoid using any kind of public transportation, ridesharing, or taxis. 2. Monitor your symptoms carefully. If your symptoms get worse, call your healthcare provider immediately. 3. Get rest and stay hydrated. 4. If you have a medical appointment, call the healthcare provider ahead of time and tell them that you have or may have COVID-19. 5. For medical emergencies, call 911 and notify the dispatch personnel that you have or may have COVID-19. 6. Cover your cough and sneezes with a tissue or use the inside of your elbow. 7. Wash your hands often with soap and water for at least 20 seconds or clean your hands with an alcohol-based hand sanitizer that contains at least 60% alcohol. 8. As much as possible, stay in a specific room and away from other people in your home. Also, you should use a separate bathroom, if available. If you need to be around other people in or outside of the home, wear a mask. 9. Avoid sharing personal items with other people in your household, like dishes, towels, and bedding. 10. Clean all surfaces that are touched often, like counters, tabletops, and doorknobs. Use household cleaning sprays or wipes according to the label instructions. cdc.gov/coronavirus 10/23/2018 This information is not intended to replace advice given to you by your health care provider. Make sure you discuss any questions you have with your health care provider. Document Revised: 03/27/2019 Document Reviewed: 03/27/2019 Elsevier Patient Education  2020 Elsevier Inc.  

## 2019-05-06 NOTE — Telephone Encounter (Signed)
Transition of Care Contact from [facility] [place]   Date of Discharge: 05/05/2019 Date of Contact: 05/06/2019 Method of contact: phone Talked to patient   Patient contacted to discuss transition of care form recent hospitaliztion. Patient was admitted to [facility] from 05/02/19 to 05/05/19 with the discharge diagnosis of acute respiratory failure d/t COVID 19, NSTEMI/angina and wide complex tachycardia.  Medication changes were reviewed including addition of aspirin and amidarone and change in the administration of clonidine.  Patient will follow up with is outpatient dialysis center 05/06/2019.   Other follow up needs include 2 OP remdesivir infusions scheduled for 1/12 and 1/13 and follow up with cardiology on 05/26/19.     Jen Mow, PA-C [facility] Pager: [phone]

## 2019-05-07 ENCOUNTER — Ambulatory Visit (HOSPITAL_COMMUNITY)
Admit: 2019-05-07 | Discharge: 2019-05-07 | Disposition: A | Discharge status: Home / Self Care | Payer: Medicare Other | Attending: Pulmonary Disease | Admitting: Pulmonary Disease

## 2019-05-07 DIAGNOSIS — U071 COVID-19: Secondary | ICD-10-CM | POA: Diagnosis not present

## 2019-05-07 MED ORDER — ALBUTEROL SULFATE HFA 108 (90 BASE) MCG/ACT IN AERS: 2.0000 | INHALATION_SPRAY | Freq: Once | RESPIRATORY_TRACT | Status: DC | PRN

## 2019-05-07 MED ORDER — DIPHENHYDRAMINE HCL 50 MG/ML IJ SOLN: 50.0000 mg | Freq: Once | INTRAMUSCULAR | Status: DC | PRN

## 2019-05-07 MED ORDER — SODIUM CHLORIDE 0.9 % IV SOLN
INTRAVENOUS | Status: AC
  Administered 2019-05-07: 100 mg via INTRAVENOUS
  Filled 2019-05-07: qty 20

## 2019-05-07 MED ORDER — FAMOTIDINE IN NACL 20-0.9 MG/50ML-% IV SOLN: 20.0000 mg | Freq: Once | INTRAVENOUS | Status: DC | PRN

## 2019-05-07 MED ORDER — EPINEPHRINE 0.3 MG/0.3ML IJ SOAJ: 0.3000 mg | Freq: Once | INTRAMUSCULAR | Status: DC | PRN

## 2019-05-07 MED ORDER — METHYLPREDNISOLONE SODIUM SUCC 125 MG IJ SOLR: 125.0000 mg | Freq: Once | INTRAMUSCULAR | Status: DC | PRN

## 2019-05-07 MED ORDER — SODIUM CHLORIDE 0.9 % IV SOLN
INTRAVENOUS | Status: DC | PRN
  Administered 2019-05-07: 250 mL via INTRAVENOUS

## 2019-05-07 MED ORDER — SODIUM CHLORIDE 0.9 % IV SOLN: 100.0000 mg | Freq: Once | INTRAVENOUS | Status: AC

## 2019-05-07 NOTE — Progress Notes (Signed)
  Diagnosis: COVID-19  Physician: Dr. Cher Nakai  Procedure: Covid Infusion Clinic Med: remdesivir infusion.  Complications: No immediate complications noted.  Discharge: Discharged home   Destyne Goodreau L 05/07/2019

## 2019-05-08 DIAGNOSIS — E1129 Type 2 diabetes mellitus with other diabetic kidney complication: Secondary | ICD-10-CM | POA: Diagnosis not present

## 2019-05-08 DIAGNOSIS — N2581 Secondary hyperparathyroidism of renal origin: Secondary | ICD-10-CM | POA: Diagnosis not present

## 2019-05-08 DIAGNOSIS — D631 Anemia in chronic kidney disease: Secondary | ICD-10-CM | POA: Diagnosis not present

## 2019-05-08 DIAGNOSIS — Z992 Dependence on renal dialysis: Secondary | ICD-10-CM | POA: Diagnosis not present

## 2019-05-08 DIAGNOSIS — D509 Iron deficiency anemia, unspecified: Secondary | ICD-10-CM | POA: Diagnosis not present

## 2019-05-08 DIAGNOSIS — D689 Coagulation defect, unspecified: Secondary | ICD-10-CM | POA: Diagnosis not present

## 2019-05-08 DIAGNOSIS — N186 End stage renal disease: Secondary | ICD-10-CM | POA: Diagnosis not present

## 2019-05-10 DIAGNOSIS — E1129 Type 2 diabetes mellitus with other diabetic kidney complication: Secondary | ICD-10-CM | POA: Diagnosis not present

## 2019-05-10 DIAGNOSIS — N2581 Secondary hyperparathyroidism of renal origin: Secondary | ICD-10-CM | POA: Diagnosis not present

## 2019-05-10 DIAGNOSIS — N186 End stage renal disease: Secondary | ICD-10-CM | POA: Diagnosis not present

## 2019-05-10 DIAGNOSIS — D631 Anemia in chronic kidney disease: Secondary | ICD-10-CM | POA: Diagnosis not present

## 2019-05-10 DIAGNOSIS — D689 Coagulation defect, unspecified: Secondary | ICD-10-CM | POA: Diagnosis not present

## 2019-05-10 DIAGNOSIS — D509 Iron deficiency anemia, unspecified: Secondary | ICD-10-CM | POA: Diagnosis not present

## 2019-05-10 DIAGNOSIS — Z992 Dependence on renal dialysis: Secondary | ICD-10-CM | POA: Diagnosis not present

## 2019-05-12 ENCOUNTER — Telehealth: Payer: Self-pay | Admitting: Cardiology

## 2019-05-12 ENCOUNTER — Other Ambulatory Visit: Payer: Self-pay | Admitting: *Deleted

## 2019-05-12 MED ORDER — AMIODARONE HCL 200 MG PO TABS: 200.0000 mg | ORAL_TABLET | Freq: Every day | ORAL | 1 refills | Status: DC

## 2019-05-12 NOTE — Telephone Encounter (Signed)
Refill sent to [facility].  

## 2019-05-12 NOTE — Telephone Encounter (Signed)
°*  STAT* If patient is at the pharmacy, call can be transferred to refill team.   1. Which medications need to be refilled? (please list name of each medication and dose if known)   amiodarone (PACERONE) 200 MG tablet   2. Which pharmacy/location (including street and city if local pharmacy) is medication to be sent to? [place], [place]  3. Do they need a 30 day or 90 day supply? 90 day supply  Patient states he has 6 tablets left.

## 2019-05-13 DIAGNOSIS — D689 Coagulation defect, unspecified: Secondary | ICD-10-CM | POA: Diagnosis not present

## 2019-05-13 DIAGNOSIS — Z992 Dependence on renal dialysis: Secondary | ICD-10-CM | POA: Diagnosis not present

## 2019-05-13 DIAGNOSIS — N2581 Secondary hyperparathyroidism of renal origin: Secondary | ICD-10-CM | POA: Diagnosis not present

## 2019-05-13 DIAGNOSIS — D631 Anemia in chronic kidney disease: Secondary | ICD-10-CM | POA: Diagnosis not present

## 2019-05-13 DIAGNOSIS — E1129 Type 2 diabetes mellitus with other diabetic kidney complication: Secondary | ICD-10-CM | POA: Diagnosis not present

## 2019-05-13 DIAGNOSIS — N186 End stage renal disease: Secondary | ICD-10-CM | POA: Diagnosis not present

## 2019-05-13 DIAGNOSIS — D509 Iron deficiency anemia, unspecified: Secondary | ICD-10-CM | POA: Diagnosis not present

## 2019-05-15 DIAGNOSIS — N2581 Secondary hyperparathyroidism of renal origin: Secondary | ICD-10-CM | POA: Diagnosis not present

## 2019-05-15 DIAGNOSIS — E1129 Type 2 diabetes mellitus with other diabetic kidney complication: Secondary | ICD-10-CM | POA: Diagnosis not present

## 2019-05-15 DIAGNOSIS — D509 Iron deficiency anemia, unspecified: Secondary | ICD-10-CM | POA: Diagnosis not present

## 2019-05-15 DIAGNOSIS — N186 End stage renal disease: Secondary | ICD-10-CM | POA: Diagnosis not present

## 2019-05-15 DIAGNOSIS — D631 Anemia in chronic kidney disease: Secondary | ICD-10-CM | POA: Diagnosis not present

## 2019-05-15 DIAGNOSIS — Z992 Dependence on renal dialysis: Secondary | ICD-10-CM | POA: Diagnosis not present

## 2019-05-15 DIAGNOSIS — D689 Coagulation defect, unspecified: Secondary | ICD-10-CM | POA: Diagnosis not present

## 2019-05-16 DIAGNOSIS — N2581 Secondary hyperparathyroidism of renal origin: Secondary | ICD-10-CM | POA: Diagnosis not present

## 2019-05-16 DIAGNOSIS — E1140 Type 2 diabetes mellitus with diabetic neuropathy, unspecified: Secondary | ICD-10-CM | POA: Diagnosis not present

## 2019-05-16 DIAGNOSIS — K219 Gastro-esophageal reflux disease without esophagitis: Secondary | ICD-10-CM | POA: Diagnosis not present

## 2019-05-16 DIAGNOSIS — N186 End stage renal disease: Secondary | ICD-10-CM | POA: Diagnosis not present

## 2019-05-16 DIAGNOSIS — D509 Iron deficiency anemia, unspecified: Secondary | ICD-10-CM | POA: Diagnosis not present

## 2019-05-16 DIAGNOSIS — R809 Proteinuria, unspecified: Secondary | ICD-10-CM | POA: Diagnosis not present

## 2019-05-16 DIAGNOSIS — E1129 Type 2 diabetes mellitus with other diabetic kidney complication: Secondary | ICD-10-CM | POA: Diagnosis not present

## 2019-05-16 DIAGNOSIS — D631 Anemia in chronic kidney disease: Secondary | ICD-10-CM | POA: Diagnosis not present

## 2019-05-16 DIAGNOSIS — D689 Coagulation defect, unspecified: Secondary | ICD-10-CM | POA: Diagnosis not present

## 2019-05-16 DIAGNOSIS — Z992 Dependence on renal dialysis: Secondary | ICD-10-CM | POA: Diagnosis not present

## 2019-05-19 DIAGNOSIS — D689 Coagulation defect, unspecified: Secondary | ICD-10-CM | POA: Diagnosis not present

## 2019-05-19 DIAGNOSIS — N2581 Secondary hyperparathyroidism of renal origin: Secondary | ICD-10-CM | POA: Diagnosis not present

## 2019-05-19 DIAGNOSIS — D631 Anemia in chronic kidney disease: Secondary | ICD-10-CM | POA: Diagnosis not present

## 2019-05-19 DIAGNOSIS — D509 Iron deficiency anemia, unspecified: Secondary | ICD-10-CM | POA: Diagnosis not present

## 2019-05-19 DIAGNOSIS — N186 End stage renal disease: Secondary | ICD-10-CM | POA: Diagnosis not present

## 2019-05-19 DIAGNOSIS — E1129 Type 2 diabetes mellitus with other diabetic kidney complication: Secondary | ICD-10-CM | POA: Diagnosis not present

## 2019-05-19 DIAGNOSIS — Z992 Dependence on renal dialysis: Secondary | ICD-10-CM | POA: Diagnosis not present

## 2019-05-21 DIAGNOSIS — D509 Iron deficiency anemia, unspecified: Secondary | ICD-10-CM | POA: Diagnosis not present

## 2019-05-21 DIAGNOSIS — D631 Anemia in chronic kidney disease: Secondary | ICD-10-CM | POA: Diagnosis not present

## 2019-05-21 DIAGNOSIS — N186 End stage renal disease: Secondary | ICD-10-CM | POA: Diagnosis not present

## 2019-05-21 DIAGNOSIS — Z992 Dependence on renal dialysis: Secondary | ICD-10-CM | POA: Diagnosis not present

## 2019-05-21 DIAGNOSIS — E1129 Type 2 diabetes mellitus with other diabetic kidney complication: Secondary | ICD-10-CM | POA: Diagnosis not present

## 2019-05-21 DIAGNOSIS — D689 Coagulation defect, unspecified: Secondary | ICD-10-CM | POA: Diagnosis not present

## 2019-05-21 DIAGNOSIS — N2581 Secondary hyperparathyroidism of renal origin: Secondary | ICD-10-CM | POA: Diagnosis not present

## 2019-05-23 ENCOUNTER — Other Ambulatory Visit: Payer: Self-pay

## 2019-05-23 DIAGNOSIS — D631 Anemia in chronic kidney disease: Secondary | ICD-10-CM | POA: Diagnosis not present

## 2019-05-23 DIAGNOSIS — Z992 Dependence on renal dialysis: Secondary | ICD-10-CM | POA: Diagnosis not present

## 2019-05-23 DIAGNOSIS — N2581 Secondary hyperparathyroidism of renal origin: Secondary | ICD-10-CM | POA: Diagnosis not present

## 2019-05-23 DIAGNOSIS — D689 Coagulation defect, unspecified: Secondary | ICD-10-CM | POA: Diagnosis not present

## 2019-05-23 DIAGNOSIS — D509 Iron deficiency anemia, unspecified: Secondary | ICD-10-CM | POA: Diagnosis not present

## 2019-05-23 DIAGNOSIS — N186 End stage renal disease: Secondary | ICD-10-CM | POA: Diagnosis not present

## 2019-05-23 DIAGNOSIS — E1129 Type 2 diabetes mellitus with other diabetic kidney complication: Secondary | ICD-10-CM | POA: Diagnosis not present

## 2019-05-23 NOTE — Progress Notes (Signed)
Cardiology Office Note:    Date:  05/26/2019   ID:  James Chan, DOB 02-Jun-1950, MRN [id number]  PCP:  Cher Nakai, MD  Cardiologist:  Shirlee More, MD    Referring MD: Cher Nakai, MD    ASSESSMENT:    1. PAF (paroxysmal atrial fibrillation) ([place])   2. On amiodarone therapy   3. Coronary artery disease involving native coronary artery of native heart without angina pectoris   4. Hypertensive heart disease with chronic combined systolic and diastolic congestive heart failure ([place])   5. Chronic combined systolic and diastolic heart failure ([place])   6. Mixed hyperlipidemia   7. CKD (chronic kidney disease) stage V requiring chronic dialysis ([place])    PLAN:    In order of problems listed above:  1. Continue amiodarone increased dose he is on dual antiplatelet therapy for stroke prophylaxis.  If he has further breakthrough episodes despite his severe kidney disease with renal replacement or refer to EP for pulmonary vein isolation. 2. Increased dose will check level at next office visit 3. He had demand ischemia with rapid atrial fibrillation I reviewed his coronary angiogram did not require further revascularization and continue his medical therapy with dual antiplatelet beta-blocker and statin 4. His heart failure looks decompensated he has edema tells me they can to decrease his dry weight at his next dialysis session. 5. Continue statin labs are followed at the dialysis center\ 6. Continue renal replacement therapy hemodialysis and ultrafiltration   Next appointment: 6 weeks   Medication Adjustments/Labs and Tests Ordered: Current medicines are reviewed at length with the patient today.  Concerns regarding medicines are outlined above.  No orders of the defined types were placed in this encounter.  Meds ordered this encounter  Medications  . DISCONTD: amiodarone (PACERONE) 200 MG tablet    Sig: Take 1 tablet (200 mg total) by mouth 2 (two) times daily.    Dispense:  180  tablet    Refill:  1    Requesting 1 year supply  . amiodarone (PACERONE) 200 MG tablet    Sig: Take 1 tablet (200 mg total) by mouth 2 (two) times daily.    Dispense:  180 tablet    Refill:  1    Requesting 1 year supply    No chief complaint on file.   History of Present Illness:    James Chan is a 69 y.o. male with a hx of CAD s/p NSTEMI 04/19/16 with PCI of ISR of RCA with 2 DES and multiple PCI of RCA for restenosis, brief PAF on amiodarone, diastolic CHF, DLD, HTN, PVD, ESRD on HD and anemia. Echo 11/15/2017 with EF 50-55%, mild LVH, mild AR, mod to severe MR, diastolic dysfunction.    He was last seen 03/04/2019.  Compliance with diet, lifestyle and medications: Yes  He is admitted to the hospital 03/01/2020 with palpitation and dizziness.  He was found to be in a wide-complex tachycardia heart rate greater than 170 bpm and hypotensive.  Resume sinus rhythm with IV amiodarone cardiology was consulted and admitted in the hospital is acute coronary syndrome with mildly elevated troponin and proBNP level he underwent coronary angiography and then was noticed to have because of COVID-19 infection.  In retrospect he had a respiratory symptoms for 2 weeks did not become hypoxic and chest x-ray did not show pulmonary infiltrates.  He was transition from IV to oral amiodarone and was treated with dexamethasone from deficit air and Actemra. He had a recent coronary  angiography 05/01/2018 for acute coronary syndrome.  Independently reviewed his cardiac catheterization below  Conclusion Conclusions: 1. Multivessel coronary artery disease including 50-60% distal LMCA stenosis, 30% proximal LAD and 50% ostial D1 lesions, and chronic total occlusions of distal LCx and long stented segment of the proximal through distal RCA.  Occlusions of the LCx and RCA are known to be chronic based on prior outside catheterization reports. 2. Severely reduced left ventricular systolic function (LVEF ~13%)  with mildly elevated filling pressure.  Recommendations: 1. Medical therapy of worsening cardiomyopathy complicated by wide-complex tachycardia.  Recommend formal consultation with EP. 2. Medical therapy of left main disease and LCx/RCA chronic total occlusions in the setting of acute comorbidities.  When patient has been optimized from a heart failure standpoint and COVID-19 resolved, revascularization options may need to be readdressed. 3. Optimize evidence-based heart failure therapy. 4. Consultation with internal medicine and/or critical care medicine to assist with management of COVID-19 infection.   Echo 04/23/2019: 1. Left ventricular ejection fraction, by visual estimation, is 40 to  45%. The left ventricle has mild to moderately decreased function with  global hypokinesis. The left ventricular cavity size is mildy dilated.  There is moderate to severe increased  eccentric left ventricular hypertrophy.  2. Left ventricular diastolic parameters are consistent with Grade II  diastolic dysfunction (pseudonormalization).  3. The Global left ventricular strain is abnormal (-6.2%).  4. Global right ventricle has normal systolic function.The right  ventricular size is normal. No increase in right ventricular wall  thickness.  5. Left atrial size was mildly dilated.  6. Right atrial size was mildly dilated.  7. The mitral valve is normal in structure. Mild to moderate mitral valve  regurgitation. No evidence of mitral stenosis.  8. The tricuspid valve is normal in structure.  9. The aortic valve is trileaflet and thickened. Aortic regurgitation is  not visualized. Mild aortic valve sclerosis without stenosis.  10. The pulmonic valve was normal in structure. Pulmonic valve  regurgitation is not visualized.  11. There is mild dilatation of the ascending aorta measuring 39 mm.  12. Mild Pulmonary Hypertension. Pulmonary artery systolic pressure 38  mmHg.  13. Compared to TTE  done in July 2019, the Left ventricular ejection  fraction is reduced from 50-55% to now 40-45%. The mitral regurgitation is  no longer moderate to severe.   EKG 05/02/2019 and presentation in the hospital independently reviewed wide-complex tachycardia QRS duration 136 ms the appearance is  left bundle branch block atrial activity cannot be discerned   Ref Range & Units 3 wk ago  (05/02/19) 3 wk ago  (05/02/19)  Troponin I (High Sensitivity) <18 ng/L 3,146High Panic   21High     Ref Range & Units 3 wk ago  B Natriuretic Peptide 0.0 - 100.0 pg/mL 2,378.8High    Since hospitalization he feels weak especially after dialysis and has orthostatic lightheadedness.  He has had no fever chills tells me has had 2 sequential COVID-19 test that are now normal and has no cough shortness of breath chest pain palpitation or syncope.  He is on long-term amiodarone received IV in the hospital I will increase his dose until seen back by me in the office and recheck amiodarone level at that time.  He is maintaining sinus rhythm. Past Medical History:  Diagnosis Date  . Anemia   . Anxiety state, unspecified 09/15/2008   Qualifier: Diagnosis of  By: Bullins CMA, Ami  , patient denies  . Arterial insufficiency of lower extremity (  [place]) 05/10/2016  . Arthritis    elbows  . BACK PAIN, LUMBAR, CHRONIC 09/15/2008   Qualifier: Diagnosis of  By: Bullins CMA, Ami    . CHF (congestive heart failure) ([place])   . Claudication ([place]) 10/30/2014  . Comprehensive diabetic foot examination, type 2 DM, encounter for [facility]) 09/15/2008   Qualifier: Diagnosis of  By: Bullins CMA, Ami    . DEPRESSIVE DISORDER 09/15/2008   Qualifier: Diagnosis of  By: Bullins CMA, Ami    . Diabetes mellitus without complication ([place])   . Diabetic polyneuropathy associated with diabetes mellitus due to underlying condition ([place]) 06/28/2015  . Dyslipidemia 11/24/2015  . Dyspnea    with exertion  . End stage renal disease on dialysis ([place])    M/W/F [facility]    . Esophageal reflux 09/15/2008   Qualifier: Diagnosis of  By: Bullins CMA, Ami    . Glaucoma   . Gout   . Hammer toes of both feet 06/28/2015  . History of blood transfusion   . History of carotid artery stenosis 10/30/2014  . 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 Hyperlipidemia   . Hypertension   . Hypertensive heart failure ([place]) 11/28/2016  . HYPERTRIGLYCERIDEMIA 09/15/2008   Qualifier: Diagnosis of  By: Bullins CMA, Ami    . Myocardial infarction ([place]) 1997  . NSTEMI (non-ST elevated myocardial infarction) ([place]) 09/15/2008   Qualifier: Diagnosis of  By: Bullins CMA, Ami    . Onychomycosis due to dermatophyte 06/28/2015  . PAD (peripheral artery disease) ([place]) 10/30/2014  . Sleep apnea    no CPAP  . Stroke [facility])    no residual    Past Surgical History:  Procedure Laterality Date  . angioplasty of iliofemoral and iliac artery with stent placement    . AV FISTULA PLACEMENT Right 07/04/2018   Procedure: CREATION BASILIC VEIN ARTERIOVENOUS FISTULA RIGHT ARM;  Surgeon: [name], MD;  Location: [facility];  Service: Vascular;  Laterality: Right;  . BASCILIC VEIN TRANSPOSITION Right 10/10/2018   Procedure: BASILIC VEIN TRANSPOSITION SECOND STAGE Right upper arm;  Surgeon: [name], MD;  Location: [facility];  Service: Vascular;  Laterality: Right;  . CARDIAC CATHETERIZATION    . CAROTID STENT Bilateral   . CATARACT EXTRACTION W/ INTRAOCULAR LENS  IMPLANT, BILATERAL    . COLONOSCOPY W/ POLYPECTOMY    . CORONARY ANGIOPLASTY     stents x 5  . INSERTION OF DIALYSIS CATHETER    . KNEE SURGERY     right/ left worked on ligaments and tendons  . LEFT HEART CATH AND CORONARY ANGIOGRAPHY N/A 05/02/2019   Procedure: LEFT HEART CATH AND CORONARY ANGIOGRAPHY;  Surgeon: Nelva Bush, MD;  Location: [facility];  Service: Cardiovascular;  Laterality: N/A;  . OTHER SURGICAL HISTORY     reports 32 stents to include being in eye    Current Medications: Current Meds   Medication Sig  . ACCU-CHEK AVIVA PLUS test strip daily.  . Accu-Chek Softclix Lancets lancets USE AS DIRECTED DAILY AS NEEDED  . allopurinol (ZYLOPRIM) 100 MG tablet Take 100 mg by mouth at bedtime.   Marland Kitchen amiodarone (PACERONE) 200 MG tablet Take 1 tablet (200 mg total) by mouth 2 (two) times daily.  Marland Kitchen aspirin 81 MG chewable tablet Chew 1 tablet (81 mg total) by mouth daily.  Marland Kitchen atorvastatin (LIPITOR) 40 MG tablet Take 1 tablet (40 mg total) by mouth every evening.  . cloNIDine (CATAPRES) 0.1 MG tablet Take 1 tablet (0.1 mg total)  by mouth 2 (two) times daily as needed (if systolic blood pressure above 160 mmHg). Take 0.1mg  (one tablet) on non-hemodialysis days if systolic blood pressure is >160.  Marland Kitchen clopidogrel (PLAVIX) 75 MG tablet Take 75 mg by mouth daily.   Marland Kitchen dicyclomine (BENTYL) 10 MG capsule Take 10 mg by mouth every 6 (six) hours as needed.  . hydrALAZINE (APRESOLINE) 25 MG tablet Take 25 mg by mouth 3 (three) times daily.   . Insulin Glargine (TOUJEO SOLOSTAR [place]) Inject 30 Units into the skin at bedtime.  . Insulin Pen Needle (NOVOTWIST) 32G X 5 MM MISC   . latanoprost (XALATAN) 0.005 % ophthalmic solution Place 1 drop into both eyes at bedtime.  . lidocaine-prilocaine (EMLA) cream Apply 1 application topically See admin instructions. Apply small amount to access site 1 to 2 hours before dialysis then cover with saran wrap.  . Methoxy PEG-Epoetin Beta (MIRCERA IJ) Mircera  . metoprolol tartrate (LOPRESSOR) 25 MG tablet Take 25 mg by mouth 2 (two) times daily.   . nitroGLYCERIN (NITROSTAT) 0.4 MG SL tablet Place 1 tablet (0.4 mg total) under the tongue every 5 (five) minutes as needed for chest pain.  Marland Kitchen oxyCODONE-acetaminophen (PERCOCET) 5-325 MG tablet Take 1 tablet by mouth every 4 (four) hours as needed for severe pain.  . polyethylene glycol (MIRALAX / GLYCOLAX) packet Take 17 g by mouth daily as needed for mild constipation.  Marland Kitchen PROAIR HFA 108 (90 Base) MCG/ACT inhaler Inhale 2 puffs  into the lungs every 6 (six) hours as needed for wheezing or shortness of breath.   . [DISCONTINUED] amiodarone (PACERONE) 200 MG tablet Take 1 tablet (200 mg total) by mouth daily.  . [DISCONTINUED] amiodarone (PACERONE) 200 MG tablet Take 1 tablet (200 mg total) by mouth 2 (two) times daily.     Allergies:   Patient has no known allergies.   Social History   Socioeconomic History  . Marital status: Married    Spouse name: Not on file  . Number of children: Not on file  . Years of education: Not on file  . Highest education level: Not on file  Occupational History  . Not on file  Tobacco Use  . Smoking status: Former Smoker    Years: 36.00    Quit date: 05/10/1995    Years since quitting: 24.0  . Smokeless tobacco: Never Used  Substance and Sexual Activity  . Alcohol use: No  . Drug use: No  . Sexual activity: Not on file  Other Topics Concern  . Not on file  Social History Narrative  . Not on file   Social Determinants of Health   Financial Resource Strain:   . Difficulty of Paying Living Expenses: Not on file  Food Insecurity:   . Worried About Charity fundraiser in the Last Year: Not on file  . Ran Out of Food in the Last Year: Not on file  Transportation Needs:   . Lack of Transportation (Medical): Not on file  . Lack of Transportation (Non-Medical): Not on file  Physical Activity:   . Days of Exercise per Week: Not on file  . Minutes of Exercise per Session: Not on file  Stress:   . Feeling of Stress : Not on file  Social Connections:   . Frequency of Communication with Friends and Family: Not on file  . Frequency of Social Gatherings with Friends and Family: Not on file  . Attends Religious Services: Not on file  . Active Member of Clubs  or Organizations: Not on file  . Attends Archivist Meetings: Not on file  . Marital Status: Not on file     Family History: The patient's family history includes Cancer in his father and mother; Heart disease  in his father and mother. ROS:   Please see the history of present illness.    All other systems reviewed and are negative.  EKGs/Labs/Other Studies Reviewed:    The following studies were reviewed today:  EKG: History independently reviewed his EKGs from the hospital  Recent Labs: 12/05/2018: TSH 3.740 05/02/2019: B Natriuretic Peptide 2,378.8 05/03/2019: ALT 14; Hemoglobin 8.9; Platelets 317 05/05/2019: BUN 56; Creatinine, Ser 7.47; Magnesium 2.0; Potassium 4.2; Sodium 125  Recent Lipid Panel    Component Value Date/Time   CHOL 80 05/03/2019 0111   CHOL 155 12/05/2018 1010   TRIG 56 05/03/2019 0111   HDL 21 (L) 05/03/2019 0111   HDL 40 12/05/2018 1010   CHOLHDL 3.8 05/03/2019 0111   VLDL 11 05/03/2019 0111   LDLCALC 48 05/03/2019 0111   LDLCALC 97 12/05/2018 1010    Physical Exam:    VS:  BP 120/64   Pulse 61   Ht 6' (1.829 m)   Wt 190 lb 3.2 oz (86.3 kg)   SpO2 97%   BMI 25.80 kg/m     Wt Readings from Last 3 Encounters:  05/26/19 190 lb 3.2 oz (86.3 kg)  05/05/19 201 lb 8 oz (91.4 kg)  03/04/19 203 lb 12.8 oz (92.4 kg)     GEN:  Well nourished, well developed in no acute distress HEENT: Normal NECK: No JVD; No carotid bruits LYMPHATICS: No lymphadenopathy CARDIAC: RRR, no murmurs, rubs, gallops RESPIRATORY:  Clear to auscultation without rales, wheezing or rhonchi  ABDOMEN: Soft, non-tender, non-distended MUSCULOSKELETAL:  No edema; No deformity  SKIN: Warm and dry NEUROLOGIC:  Alert and oriented x 3 PSYCHIATRIC:  Normal affect    Signed, Shirlee More, MD  05/26/2019 4:36 PM    [facility]

## 2019-05-25 DIAGNOSIS — N186 End stage renal disease: Secondary | ICD-10-CM | POA: Diagnosis not present

## 2019-05-25 DIAGNOSIS — Z992 Dependence on renal dialysis: Secondary | ICD-10-CM | POA: Diagnosis not present

## 2019-05-25 DIAGNOSIS — E1122 Type 2 diabetes mellitus with diabetic chronic kidney disease: Secondary | ICD-10-CM | POA: Diagnosis not present

## 2019-05-26 ENCOUNTER — Encounter: Payer: Self-pay | Admitting: Cardiology

## 2019-05-26 ENCOUNTER — Other Ambulatory Visit: Payer: Self-pay

## 2019-05-26 ENCOUNTER — Ambulatory Visit (INDEPENDENT_AMBULATORY_CARE_PROVIDER_SITE_OTHER): Payer: Medicare Other | Admitting: Cardiology

## 2019-05-26 VITALS — BP 120/64 | HR 61 | Ht 72.0 in | Wt 190.2 lb

## 2019-05-26 DIAGNOSIS — D631 Anemia in chronic kidney disease: Secondary | ICD-10-CM | POA: Diagnosis not present

## 2019-05-26 DIAGNOSIS — E782 Mixed hyperlipidemia: Secondary | ICD-10-CM

## 2019-05-26 DIAGNOSIS — Z992 Dependence on renal dialysis: Secondary | ICD-10-CM

## 2019-05-26 DIAGNOSIS — D689 Coagulation defect, unspecified: Secondary | ICD-10-CM | POA: Diagnosis not present

## 2019-05-26 DIAGNOSIS — N2581 Secondary hyperparathyroidism of renal origin: Secondary | ICD-10-CM | POA: Diagnosis not present

## 2019-05-26 DIAGNOSIS — Z79899 Other long term (current) drug therapy: Secondary | ICD-10-CM | POA: Diagnosis not present

## 2019-05-26 DIAGNOSIS — I5042 Chronic combined systolic (congestive) and diastolic (congestive) heart failure: Secondary | ICD-10-CM | POA: Diagnosis not present

## 2019-05-26 DIAGNOSIS — I110 Hypertensive heart disease with heart failure: Secondary | ICD-10-CM

## 2019-05-26 DIAGNOSIS — I480 Paroxysmal atrial fibrillation: Secondary | ICD-10-CM | POA: Diagnosis not present

## 2019-05-26 DIAGNOSIS — N186 End stage renal disease: Secondary | ICD-10-CM

## 2019-05-26 DIAGNOSIS — I2510 Atherosclerotic heart disease of native coronary artery without angina pectoris: Secondary | ICD-10-CM | POA: Diagnosis not present

## 2019-05-26 MED ORDER — AMIODARONE HCL 200 MG PO TABS: 200.0000 mg | ORAL_TABLET | Freq: Two times a day (BID) | ORAL | 1 refills | Status: DC

## 2019-05-26 NOTE — Patient Instructions (Signed)
Medication Instructions:  Your physician has recommended you make the following change in your medication:    INCREASE amiodarone to 200mg  (1 tablet) twice dailye *If you need a refill on your cardiac medications before your next appointment, please call your pharmacy*  Lab Work: NONE If you have labs (blood work) drawn today and your tests are completely normal, you will receive your results only by: Marland Kitchen MyChart Message (if you have MyChart) OR . A paper copy in the mail If you have any lab test that is abnormal or we need to change your treatment, we will call you to review the results.  Testing/Procedures: NONE  Follow-Up: At [facility], you and your health needs are our priority.  As part of our continuing mission to provide you with exceptional heart care, we have created designated Provider Care Teams.  These Care Teams include your primary Cardiologist (physician) and Advanced Practice Providers (APPs -  Physician Assistants and Nurse Practitioners) who all work together to provide you with the care you need, when you need it.  Your next appointment:   6 week(s)  The format for your next appointment:   In Person  Provider:   Shirlee More, MD  Other Instructions Wear compression knee hose

## 2019-05-28 ENCOUNTER — Telehealth: Payer: Self-pay | Admitting: Cardiology

## 2019-05-28 DIAGNOSIS — D689 Coagulation defect, unspecified: Secondary | ICD-10-CM | POA: Diagnosis not present

## 2019-05-28 DIAGNOSIS — N186 End stage renal disease: Secondary | ICD-10-CM | POA: Diagnosis not present

## 2019-05-28 DIAGNOSIS — D631 Anemia in chronic kidney disease: Secondary | ICD-10-CM | POA: Diagnosis not present

## 2019-05-28 DIAGNOSIS — Z992 Dependence on renal dialysis: Secondary | ICD-10-CM | POA: Diagnosis not present

## 2019-05-28 DIAGNOSIS — N2581 Secondary hyperparathyroidism of renal origin: Secondary | ICD-10-CM | POA: Diagnosis not present

## 2019-05-28 NOTE — Telephone Encounter (Signed)
James Chan I do not think this is due to amiodarone as it really does not affect blood pressure I would encourage him to continue taking the higher doses he was just in the hospital with rapid atrial fibrillation and he needs to speak to the dialysis team and likely needs his blood pressure medications adjusted by them avoid hypotension during dialysis

## 2019-05-28 NOTE — Telephone Encounter (Signed)
Patient called in. He reports he saw Dr. Bettina Gavia on Monday and he increased his amiodarone to 200 mg twice daily. He started taking it this way last night. He went to dialysis today and had to leave after a hour. His blood pressure got very low, 49/30, 45/27, 52/39, and 64/34. They were not able to continue dialysis. Before he left his blood pressures got back up to 154/64 and 132/57. The patient reports feeling fine now. No dizziness. He believes this happened due to the increase in amiodarone and wants to know if Dr. Bettina Gavia thinks he should got back to his previous dose. Will consult with Dr. Bettina Gavia.

## 2019-05-28 NOTE — Telephone Encounter (Signed)
Called patient informed him per Dr. Bettina Gavia not to decrease amiodarone and that he doesn't think it is from the increase. He wants him to check with dialysis team about a better plan for his blood pressure medicine. Patient verbally understood. No further questions.

## 2019-05-28 NOTE — Telephone Encounter (Signed)
Left message for patient to return call.

## 2019-05-28 NOTE — Telephone Encounter (Signed)
Encounter not needed

## 2019-05-28 NOTE — Telephone Encounter (Signed)
Patient is returning call in regards to low BP.   Pt c/o BP issue: STAT if pt c/o blurred vision, one-sided weakness or slurred speech  1. What are your last 5 BP readings?  37/27  132/57  2. Are you having any other symptoms (ex. Dizziness, headache, blurred vision, passed out)? headaches  3. What is your BP issue? BP is low

## 2019-05-29 ENCOUNTER — Ambulatory Visit: Payer: Medicare Other | Admitting: Sports Medicine

## 2019-05-29 DIAGNOSIS — R809 Proteinuria, unspecified: Secondary | ICD-10-CM | POA: Diagnosis not present

## 2019-05-29 DIAGNOSIS — K219 Gastro-esophageal reflux disease without esophagitis: Secondary | ICD-10-CM | POA: Diagnosis not present

## 2019-05-29 DIAGNOSIS — E1140 Type 2 diabetes mellitus with diabetic neuropathy, unspecified: Secondary | ICD-10-CM | POA: Diagnosis not present

## 2019-05-30 DIAGNOSIS — N186 End stage renal disease: Secondary | ICD-10-CM | POA: Diagnosis not present

## 2019-05-30 DIAGNOSIS — D631 Anemia in chronic kidney disease: Secondary | ICD-10-CM | POA: Diagnosis not present

## 2019-05-30 DIAGNOSIS — Z992 Dependence on renal dialysis: Secondary | ICD-10-CM | POA: Diagnosis not present

## 2019-05-30 DIAGNOSIS — D689 Coagulation defect, unspecified: Secondary | ICD-10-CM | POA: Diagnosis not present

## 2019-05-30 DIAGNOSIS — N2581 Secondary hyperparathyroidism of renal origin: Secondary | ICD-10-CM | POA: Diagnosis not present

## 2019-06-02 DIAGNOSIS — N186 End stage renal disease: Secondary | ICD-10-CM | POA: Diagnosis not present

## 2019-06-02 DIAGNOSIS — Z992 Dependence on renal dialysis: Secondary | ICD-10-CM | POA: Diagnosis not present

## 2019-06-02 DIAGNOSIS — D689 Coagulation defect, unspecified: Secondary | ICD-10-CM | POA: Diagnosis not present

## 2019-06-02 DIAGNOSIS — N2581 Secondary hyperparathyroidism of renal origin: Secondary | ICD-10-CM | POA: Diagnosis not present

## 2019-06-02 DIAGNOSIS — D631 Anemia in chronic kidney disease: Secondary | ICD-10-CM | POA: Diagnosis not present

## 2019-06-04 DIAGNOSIS — D631 Anemia in chronic kidney disease: Secondary | ICD-10-CM | POA: Diagnosis not present

## 2019-06-04 DIAGNOSIS — D689 Coagulation defect, unspecified: Secondary | ICD-10-CM | POA: Diagnosis not present

## 2019-06-04 DIAGNOSIS — N2581 Secondary hyperparathyroidism of renal origin: Secondary | ICD-10-CM | POA: Diagnosis not present

## 2019-06-04 DIAGNOSIS — N186 End stage renal disease: Secondary | ICD-10-CM | POA: Diagnosis not present

## 2019-06-04 DIAGNOSIS — Z992 Dependence on renal dialysis: Secondary | ICD-10-CM | POA: Diagnosis not present

## 2019-06-05 ENCOUNTER — Other Ambulatory Visit: Payer: Self-pay

## 2019-06-05 ENCOUNTER — Encounter: Payer: Self-pay | Admitting: Sports Medicine

## 2019-06-05 ENCOUNTER — Ambulatory Visit (INDEPENDENT_AMBULATORY_CARE_PROVIDER_SITE_OTHER): Payer: Medicare Other | Admitting: Sports Medicine

## 2019-06-05 DIAGNOSIS — M2042 Other hammer toe(s) (acquired), left foot: Secondary | ICD-10-CM

## 2019-06-05 DIAGNOSIS — E1142 Type 2 diabetes mellitus with diabetic polyneuropathy: Secondary | ICD-10-CM

## 2019-06-05 DIAGNOSIS — N186 End stage renal disease: Secondary | ICD-10-CM

## 2019-06-05 DIAGNOSIS — M79675 Pain in left toe(s): Secondary | ICD-10-CM | POA: Diagnosis not present

## 2019-06-05 DIAGNOSIS — B351 Tinea unguium: Secondary | ICD-10-CM

## 2019-06-05 DIAGNOSIS — Z992 Dependence on renal dialysis: Secondary | ICD-10-CM

## 2019-06-05 DIAGNOSIS — M2041 Other hammer toe(s) (acquired), right foot: Secondary | ICD-10-CM

## 2019-06-05 DIAGNOSIS — M79674 Pain in right toe(s): Secondary | ICD-10-CM

## 2019-06-05 NOTE — Progress Notes (Signed)
Subjective: James Chan is a 69 y.o. male patient with history of diabetes who returns to office today complaining of long, painful nails  while ambulating in shoes; unable to trim. Patient states that the glucose reading this morning was 112 mg/dl last A1c 5.6 and last visit to PCP was 10 days ago.  Reports that his left 1st toenail came off and blood is now gone at this nail. No other concerns.   Patient Active Problem List   Diagnosis Date Noted  . Unstable angina ([place]) 05/02/2019  . Wide-complex tachycardia ([place]) 05/02/2019  . COVID-19 virus infection 05/02/2019  . CKD (chronic kidney disease) stage V requiring chronic dialysis ([place]) 10/07/2018  . Mitral regurgitation 02/05/2018  . Bradycardia 07/02/2017  . Iron deficiency anemia 07/02/2017  . CKD (chronic kidney disease) 04/11/2017  . PAF (paroxysmal atrial fibrillation) ([place]) 02/27/2017  . Chronic combined systolic and diastolic heart failure ([place]) 11/28/2016  . Hypertensive heart disease 11/28/2016  . On amiodarone therapy 11/28/2016  . Chronic anticoagulation 11/28/2016  . Erectile dysfunction 11/28/2016  . Arterial insufficiency of lower extremity ([place]) 05/10/2016  . Coronary artery disease involving native coronary artery of native heart without angina pectoris 11/24/2015  . Dyslipidemia 11/24/2015  . Diabetic polyneuropathy associated with diabetes mellitus due to underlying condition ([place]) 06/28/2015  . Hammer toes of both feet 06/28/2015  . Onychomycosis due to dermatophyte 06/28/2015  . Type 2 diabetes, controlled, with peripheral circulatory disorder ([place]) 06/28/2015  . Claudication ([place]) 10/30/2014  . History of carotid artery stenosis 10/30/2014  . 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 PAD (peripheral artery disease) ([place]) 10/30/2014  . Comprehensive diabetic foot examination, type 2 DM, encounter for ([place]) 09/15/2008  . HYPERTRIGLYCERIDEMIA 09/15/2008  . ANXIETY STATE, UNSPECIFIED  09/15/2008  . DEPRESSIVE DISORDER 09/15/2008  . NSTEMI (non-ST elevated myocardial infarction) ([place]) 09/15/2008  . ESOPHAGEAL REFLUX 09/15/2008  . BACK PAIN, LUMBAR, CHRONIC 09/15/2008   Current Outpatient Medications on File Prior to Visit  Medication Sig Dispense Refill  . ACCU-CHEK AVIVA PLUS test strip daily.    . Accu-Chek Softclix Lancets lancets USE AS DIRECTED DAILY AS NEEDED    . allopurinol (ZYLOPRIM) 100 MG tablet Take 100 mg by mouth at bedtime.     Marland Kitchen amiodarone (PACERONE) 200 MG tablet Take 1 tablet (200 mg total) by mouth 2 (two) times daily. 180 tablet 1  . aspirin 81 MG chewable tablet Chew 1 tablet (81 mg total) by mouth daily. 90 tablet 3  . atorvastatin (LIPITOR) 40 MG tablet Take 1 tablet (40 mg total) by mouth every evening. 90 tablet 1  . cloNIDine (CATAPRES) 0.1 MG tablet Take 1 tablet (0.1 mg total) by mouth 2 (two) times daily as needed (if systolic blood pressure above 160 mmHg). Take 0.1mg  (one tablet) on non-hemodialysis days if systolic blood pressure is >160. 60 tablet 11  . clopidogrel (PLAVIX) 75 MG tablet Take 75 mg by mouth daily.     Marland Kitchen dicyclomine (BENTYL) 10 MG capsule Take 10 mg by mouth every 6 (six) hours as needed.    . hydrALAZINE (APRESOLINE) 25 MG tablet Take 25 mg by mouth 3 (three) times daily.     . Insulin Glargine (TOUJEO SOLOSTAR [place]) Inject 30 Units into the skin at bedtime.    . Insulin Pen Needle (NOVOTWIST) 32G X 5 MM MISC     . latanoprost (XALATAN) 0.005 % ophthalmic solution Place 1 drop into both eyes at bedtime.    . lidocaine-prilocaine (EMLA) cream  Apply 1 application topically See admin instructions. Apply small amount to access site 1 to 2 hours before dialysis then cover with saran wrap.    . Methoxy PEG-Epoetin Beta (MIRCERA IJ) Mircera    . metoprolol tartrate (LOPRESSOR) 25 MG tablet Take 25 mg by mouth 2 (two) times daily.     . midodrine (PROAMATINE) 5 MG tablet Take 1 tablet by mouth three times a week as directed Take 30  minutes prior to HD treatment three times a week    . nitroGLYCERIN (NITROSTAT) 0.4 MG SL tablet Place 1 tablet (0.4 mg total) under the tongue every 5 (five) minutes as needed for chest pain. 25 tablet 3  . oxyCODONE-acetaminophen (PERCOCET) 5-325 MG tablet Take 1 tablet by mouth every 4 (four) hours as needed for severe pain. 20 tablet 0  . polyethylene glycol (MIRALAX / GLYCOLAX) packet Take 17 g by mouth daily as needed for mild constipation.    Marland Kitchen PROAIR HFA 108 (90 Base) MCG/ACT inhaler Inhale 2 puffs into the lungs every 6 (six) hours as needed for wheezing or shortness of breath.   12   No current facility-administered medications on file prior to visit.   No Known Allergies  Recent Results (from the past 2160 hour(s))  Respiratory Panel by RT PCR (Flu A&B, Covid) - Nasopharyngeal Swab     Status: Abnormal   Collection Time: 05/02/19  9:33 AM   Specimen: Nasopharyngeal Swab  Result Value Ref Range   SARS Coronavirus 2 by RT PCR POSITIVE (A) NEGATIVE    Comment: RESULT CALLED TO, READ BACK BY AND VERIFIED WITH: Cath Lab 11:25 05/02/19 ([email or web address]) (NOTE) SARS-CoV-2 target nucleic acids are DETECTED. SARS-CoV-2 RNA is generally detectable in upper respiratory specimens  during the acute phase of infection. Positive results are indicative of the presence of the identified virus, but do not rule out bacterial infection or co-infection with other pathogens not detected by the test. Clinical correlation with patient history and other diagnostic information is necessary to determine patient infection status. The expected result is Negative. Fact Sheet for Patients:  PinkCheek.be Fact Sheet for Healthcare Providers: GravelBags.it This test is not yet approved or cleared by the [place] FDA and  has been authorized for detection and/or diagnosis of SARS-CoV-2 by FDA under an Emergency Use Authorization (EUA).  This EUA  will remain in effect (meaning this test can be used) for t he duration of  the COVID-19 declaration under Section 564(b)(1) of the Act, 21 U.S.C. section 360bbb-3(b)(1), unless the authorization is terminated or revoked sooner.    Influenza A by PCR NEGATIVE NEGATIVE   Influenza B by PCR NEGATIVE NEGATIVE    Comment: (NOTE) The Xpert Xpress SARS-CoV-2/FLU/RSV assay is intended as an aid in  the diagnosis of influenza from Nasopharyngeal swab specimens and  should not be used as a sole basis for treatment. Nasal washings and  aspirates are unacceptable for Xpert Xpress SARS-CoV-2/FLU/RSV  testing. Fact Sheet for Patients: PinkCheek.be Fact Sheet for Healthcare Providers: GravelBags.it This test is not yet approved or cleared by the [place] FDA and  has been authorized for detection and/or diagnosis of SARS-CoV-2 by  FDA under an Emergency Use Authorization (EUA). This EUA will remain  in effect (meaning this test can be used) for the duration of the  Covid-19 declaration under Section 564(b)(1) of the Act, 21  U.S.C. section 360bbb-3(b)(1), unless the authorization is  terminated or revoked. Performed at [facility], [place],  [place] [address]   Basic metabolic panel     Status: Abnormal   Collection Time: 05/02/19  9:39 AM  Result Value Ref Range   Sodium 135 135 - 145 mmol/L   Potassium 3.3 (L) 3.5 - 5.1 mmol/L   Chloride 96 (L) 98 - 111 mmol/L   CO2 24 22 - 32 mmol/L   Glucose, Bld 123 (H) 70 - 99 mg/dL   BUN 18 8 - 23 mg/dL   Creatinine, Ser 2.62 (H) 0.61 - 1.24 mg/dL   Calcium 7.7 (L) 8.9 - 10.3 mg/dL   GFR calc non Af Amer 24 (L) >60 mL/min   GFR calc Af Amer 28 (L) >60 mL/min   Anion gap 15 5 - 15    Comment: Performed at [facility] [address] [place], [place] [address]  CBC     Status: Abnormal   Collection Time: 05/02/19  9:39 AM  Result Value Ref Range   WBC 8.8  4.0 - 10.5 K/uL   RBC 3.20 (L) 4.22 - 5.81 MIL/uL   Hemoglobin 9.4 (L) 13.0 - 17.0 g/dL   HCT 29.5 (L) 39.0 - 52.0 %   MCV 92.2 80.0 - 100.0 fL   MCH 29.4 26.0 - 34.0 pg   MCHC 31.9 30.0 - 36.0 g/dL   RDW 14.3 11.5 - 15.5 %   Platelets 305 150 - 400 K/uL   nRBC 0.0 0.0 - 0.2 %    Comment: Performed at [facility], [place] [address], [place], [place] [address]  Troponin I (High Sensitivity)     Status: Abnormal   Collection Time: 05/02/19  9:39 AM  Result Value Ref Range   Troponin I (High Sensitivity) 21 (H) <18 ng/L    Comment: (NOTE) Elevated high sensitivity troponin I (hsTnI) values and significant  changes across serial measurements may suggest ACS but many other  chronic and acute conditions are known to elevate hsTnI results.  Refer to the "Links" section for chest pain algorithms and additional  guidance. Performed at [facility], [place] [address], [place], [place] [address]   I-stat chem 8, ED (not at [facility] or [facility])     Status: Abnormal   Collection Time: 05/02/19  9:39 AM  Result Value Ref Range   Sodium 135 135 - 145 mmol/L   Potassium 3.0 (L) 3.5 - 5.1 mmol/L   Chloride 100 98 - 111 mmol/L   BUN 16 8 - 23 mg/dL   Creatinine, Ser 2.40 (H) 0.61 - 1.24 mg/dL   Glucose, Bld 112 (H) 70 - 99 mg/dL   Calcium, Ion 0.83 (LL) 1.15 - 1.40 mmol/L   TCO2 23 22 - 32 mmol/L   Hemoglobin 8.8 (L) 13.0 - 17.0 g/dL   HCT 26.0 (L) 39.0 - 52.0 %   Comment NOTIFIED PHYSICIAN   Magnesium     Status: None   Collection Time: 05/02/19  9:39 AM  Result Value Ref Range   Magnesium 1.9 1.7 - 2.4 mg/dL    Comment: Performed at [facility], [place] [address] [place], [place] [address]  I-stat chem 8, ed     Status: Abnormal   Collection Time: 05/02/19  9:53 AM  Result Value Ref Range   Sodium 135 135 - 145 mmol/L   Potassium 3.0 (L) 3.5 - 5.1 mmol/L   Chloride 100 98 - 111 mmol/L   BUN 16 8 - 23 mg/dL   Creatinine, Ser 2.40 (H) 0.61 - 1.24 mg/dL  Glucose, Bld 111 (H) 70 -  99 mg/dL   Calcium, Ion 0.83 (LL) 1.15 - 1.40 mmol/L   TCO2 24 22 - 32 mmol/L   Hemoglobin 9.2 (L) 13.0 - 17.0 g/dL   HCT 27.0 (L) 39.0 - 52.0 %   Comment NOTIFIED PHYSICIAN   I-STAT, chem 8     Status: Abnormal   Collection Time: 05/02/19  9:53 AM  Result Value Ref Range   Sodium 135 135 - 145 mmol/L   Potassium 3.0 (L) 3.5 - 5.1 mmol/L   Chloride 100 98 - 111 mmol/L   BUN 16 8 - 23 mg/dL   Creatinine, Ser 2.40 (H) 0.61 - 1.24 mg/dL   Glucose, Bld 111 (H) 70 - 99 mg/dL   Calcium, Ion 0.83 (LL) 1.15 - 1.40 mmol/L   TCO2 24 22 - 32 mmol/L   Hemoglobin 9.2 (L) 13.0 - 17.0 g/dL   HCT 27.0 (L) 39.0 - 52.0 %   Comment NOTIFIED PHYSICIAN     Comment: DUPLICATE Charge credited Performed at [facility], [address] [place], [place] [address]   POCT Activated clotting time     Status: None   Collection Time: 05/02/19 12:08 PM  Result Value Ref Range   Activated Clotting Time 153 seconds  MRSA PCR Screening     Status: None   Collection Time: 05/02/19  2:56 PM   Specimen: Nasopharyngeal  Result Value Ref Range   MRSA by PCR NEGATIVE NEGATIVE    Comment:        The GeneXpert MRSA Assay (FDA approved for NASAL specimens only), is one component of a comprehensive MRSA colonization surveillance program. It is not intended to diagnose MRSA infection nor to guide or monitor treatment for MRSA infections. Performed at [facility], [place] [address] [place], [place] [address]   Glucose, capillary     Status: None   Collection Time: 05/02/19  4:36 PM  Result Value Ref Range   Glucose-Capillary 99 70 - 99 mg/dL  HIV Antibody (routine testing w rflx)     Status: None   Collection Time: 05/02/19  5:42 PM  Result Value Ref Range   HIV Screen 4th Generation wRfx NON REACTIVE NON REACTIVE    Comment: Performed at [facility], [place] [address], [place], [place] [address]  Hemoglobin A1c     Status: None   Collection Time: 05/02/19  5:42 PM  Result Value Ref Range    Hgb A1c MFr Bld 5.6 4.8 - 5.6 %    Comment: (NOTE) Pre diabetes:          5.7%-6.4% Diabetes:              >6.4% Glycemic control for   <7.0% adults with diabetes    Mean Plasma Glucose 114.02 mg/dL    Comment: Performed at [facility] [address] [place], [place] [address]  Troponin I (High Sensitivity)     Status: Abnormal   Collection Time: 05/02/19  5:42 PM  Result Value Ref Range   Troponin I (High Sensitivity) 3,146 (HH) <18 ng/L    Comment: CRITICAL RESULT CALLED TO, READ BACK BY AND VERIFIED WITH: D FLOYD RN AT 1837 ON [id number] BY K FORSYTH (NOTE) Elevated high sensitivity troponin I (hsTnI) values and significant  changes across serial measurements may suggest ACS but many other  chronic and acute conditions are known to elevate hsTnI results.  Refer to the Links section for chest pain algorithms  and additional  guidance. Performed at [facility], [place] [address], [place], [place] [address]   Procalcitonin     Status: None   Collection Time: 05/02/19  5:42 PM  Result Value Ref Range   Procalcitonin 0.31 ng/mL    Comment:        Interpretation: PCT (Procalcitonin) <= 0.5 ng/mL: Systemic infection (sepsis) is not likely. Local bacterial infection is possible. (NOTE)       Sepsis PCT Algorithm           Lower Respiratory Tract                                      Infection PCT Algorithm    ----------------------------     ----------------------------         PCT < 0.25 ng/mL                PCT < 0.10 ng/mL         Strongly encourage             Strongly discourage   discontinuation of antibiotics    initiation of antibiotics    ----------------------------     -----------------------------       PCT 0.25 - 0.50 ng/mL            PCT 0.10 - 0.25 ng/mL               OR       >80% decrease in PCT            Discourage initiation of                                            antibiotics      Encourage discontinuation           of antibiotics     ----------------------------     -----------------------------         PCT >= 0.50 ng/mL              PCT 0.26 - 0.50 ng/mL               AND        <80% decrease in PCT             Encourage initiation of                                             antibiotics       Encourage continuation           of antibiotics    ----------------------------     -----------------------------        PCT >= 0.50 ng/mL                  PCT > 0.50 ng/mL               AND         increase in PCT                  Strongly encourage  initiation of antibiotics    Strongly encourage escalation           of antibiotics                                     -----------------------------                                           PCT <= 0.25 ng/mL                                                 OR                                        > 80% decrease in PCT                                     Discontinue / Do not initiate                                             antibiotics Performed at [facility], [address], [place], [place] [address]   Lactate dehydrogenase     Status: Abnormal   Collection Time: 05/02/19  5:42 PM  Result Value Ref Range   LDH 222 (H) 98 - 192 U/L    Comment: Performed at [facility], [place] [address] [place], [place] [address]  Hepatitis B surface antigen     Status: None   Collection Time: 05/02/19  5:42 PM  Result Value Ref Range   Hepatitis B Surface Ag NON REACTIVE NON REACTIVE    Comment: Performed at [facility] [address] [place], [place] [address]  Ferritin     Status: Abnormal   Collection Time: 05/02/19  5:42 PM  Result Value Ref Range   Ferritin >7,500 (H) 24 - 336 ng/mL    Comment: NO VISIBLE HEMOLYSIS Performed at [facility], [place] [address] [place], [place] [address]   Fibrinogen     Status: Abnormal   Collection Time: 05/02/19  5:42 PM  Result Value Ref Range   Fibrinogen 763 (H) 210 - 475  mg/dL    Comment: Performed at [facility] [address], [place], [place] [address]  D-dimer, quantitative (not at [facility])     Status: Abnormal   Collection Time: 05/02/19  5:42 PM  Result Value Ref Range   D-Dimer, Quant 2.12 (H) 0.00 - 0.50 ug/mL-FEU    Comment: (NOTE) At the manufacturer cut-off of 0.50 ug/mL FEU, this assay has been documented to exclude PE with a sensitivity and negative predictive value of 97 to 99%.  At this time, this assay has not been approved by the FDA to exclude DVT/VTE. Results should be correlated with clinical presentation. Performed at [facility], [place] [address], [place], [place] [address]  C-reactive protein     Status: Abnormal   Collection Time: 05/02/19  5:42 PM  Result Value Ref Range   CRP 19.6 (H) <1.0 mg/dL    Comment: Performed at [facility], [place] [address], [place], [place] [address]  Brain natriuretic peptide     Status: Abnormal   Collection Time: 05/02/19  5:42 PM  Result Value Ref Range   B Natriuretic Peptide 2,378.8 (H) 0.0 - 100.0 pg/mL    Comment: Performed at [facility] [address], [place], [place] [address]  Basic metabolic panel     Status: Abnormal   Collection Time: 05/02/19  5:42 PM  Result Value Ref Range   Sodium 127 (L) 135 - 145 mmol/L    Comment: DELTA CHECK NOTED   Potassium 4.0 3.5 - 5.1 mmol/L    Comment: DELTA CHECK NOTED   Chloride 91 (L) 98 - 111 mmol/L   CO2 24 22 - 32 mmol/L   Glucose, Bld 97 70 - 99 mg/dL   BUN 24 (H) 8 - 23 mg/dL   Creatinine, Ser 3.56 (H) 0.61 - 1.24 mg/dL    Comment: DELTA CHECK NOTED   Calcium 7.8 (L) 8.9 - 10.3 mg/dL   GFR calc non Af Amer 17 (L) >60 mL/min   GFR calc Af Amer 19 (L) >60 mL/min   Anion gap 12 5 - 15    Comment: Performed at [facility] [address], [place], [place] [address]  Glucose, capillary     Status: Abnormal   Collection Time: 05/02/19 10:46 PM  Result Value Ref Range   Glucose-Capillary 129 (H) 70 - 99  mg/dL  Comprehensive metabolic panel     Status: Abnormal   Collection Time: 05/03/19  1:11 AM  Result Value Ref Range   Sodium 132 (L) 135 - 145 mmol/L   Potassium 3.8 3.5 - 5.1 mmol/L   Chloride 95 (L) 98 - 111 mmol/L   CO2 25 22 - 32 mmol/L   Glucose, Bld 115 (H) 70 - 99 mg/dL   BUN 27 (H) 8 - 23 mg/dL   Creatinine, Ser 4.01 (H) 0.61 - 1.24 mg/dL   Calcium 7.8 (L) 8.9 - 10.3 mg/dL   Total Protein 6.5 6.5 - 8.1 g/dL   Albumin 2.2 (L) 3.5 - 5.0 g/dL   AST 37 15 - 41 U/L   ALT 14 0 - 44 U/L   Alkaline Phosphatase 83 38 - 126 U/L   Total Bilirubin 1.0 0.3 - 1.2 mg/dL   GFR calc non Af Amer 15 (L) >60 mL/min   GFR calc Af Amer 17 (L) >60 mL/min   Anion gap 12 5 - 15    Comment: Performed at [facility], [place] [address] [place], [place] [address]  Lipid panel     Status: Abnormal   Collection Time: 05/03/19  1:11 AM  Result Value Ref Range   Cholesterol 80 0 - 200 mg/dL   Triglycerides 56 <150 mg/dL   HDL 21 (L) >40 mg/dL   Total CHOL/HDL Ratio 3.8 RATIO   VLDL 11 0 - 40 mg/dL   LDL Cholesterol 48 0 - 99 mg/dL    Comment:        Total Cholesterol/HDL:CHD Risk Coronary Heart Disease Risk Table                     Men   Women  1/2 Average Risk   3.4  3.3  Average Risk       5.0   4.4  2 X Average Risk   9.6   7.1  3 X Average Risk  23.4   11.0        Use the calculated Patient Ratio above and the CHD Risk Table to determine the patient's CHD Risk.        ATP III CLASSIFICATION (LDL):  <100     mg/dL   Optimal  100-129  mg/dL   Near or Above                    Optimal  130-159  mg/dL   Borderline  160-189  mg/dL   High  >190     mg/dL   Very High Performed at [facility] [address], [place], [place] [address]   CBC     Status: Abnormal   Collection Time: 05/03/19  1:11 AM  Result Value Ref Range   WBC 8.9 4.0 - 10.5 K/uL   RBC 3.07 (L) 4.22 - 5.81 MIL/uL   Hemoglobin 8.9 (L) 13.0 - 17.0 g/dL   HCT 27.7 (L) 39.0 - 52.0 %   MCV 90.2 80.0 - 100.0  fL   MCH 29.0 26.0 - 34.0 pg   MCHC 32.1 30.0 - 36.0 g/dL   RDW 14.9 11.5 - 15.5 %   Platelets 317 150 - 400 K/uL   nRBC 0.0 0.0 - 0.2 %    Comment: Performed at [facility], [place] [address], [place], [place] [address]  Phosphorus     Status: None   Collection Time: 05/03/19  1:11 AM  Result Value Ref Range   Phosphorus 4.6 2.5 - 4.6 mg/dL    Comment: Performed at [facility] [address], [place], [place] [address]  Magnesium     Status: None   Collection Time: 05/03/19  1:11 AM  Result Value Ref Range   Magnesium 1.9 1.7 - 2.4 mg/dL    Comment: Performed at [facility] [address], [place], [place] [address]  Ferritin     Status: Abnormal   Collection Time: 05/03/19  1:11 AM  Result Value Ref Range   Ferritin >7,500 (H) 24 - 336 ng/mL    Comment: Performed at [facility], [place] [address], [place], [place] [address]  D-dimer, quantitative (not at [facility])     Status: Abnormal   Collection Time: 05/03/19  1:11 AM  Result Value Ref Range   D-Dimer, Quant 2.18 (H) 0.00 - 0.50 ug/mL-FEU    Comment: (NOTE) At the manufacturer cut-off of 0.50 ug/mL FEU, this assay has been documented to exclude PE with a sensitivity and negative predictive value of 97 to 99%.  At this time, this assay has not been approved by the FDA to exclude DVT/VTE. Results should be correlated with clinical presentation. Performed at [facility], [place] [address] [place], [place] [address]   C-reactive protein     Status: Abnormal   Collection Time: 05/03/19  1:11 AM  Result Value Ref Range   CRP 19.8 (H) <1.0 mg/dL    Comment: Performed at [facility] [address], [place], [place] [address]  Glucose, capillary     Status: Abnormal   Collection Time: 05/03/19  6:47 AM  Result Value Ref Range   Glucose-Capillary 108 (H) 70 - 99 mg/dL  ABO/Rh     Status: None  Collection Time: 05/03/19 10:55 AM  Result Value Ref Range   ABO/RH(D)      O POS Performed at  [facility] [address], [place], [place] [address]   Glucose, capillary     Status: None   Collection Time: 05/03/19 11:36 AM  Result Value Ref Range   Glucose-Capillary 96 70 - 99 mg/dL  Glucose, capillary     Status: Abnormal   Collection Time: 05/03/19  5:06 PM  Result Value Ref Range   Glucose-Capillary 178 (H) 70 - 99 mg/dL  Glucose, capillary     Status: Abnormal   Collection Time: 05/03/19 11:01 PM  Result Value Ref Range   Glucose-Capillary 218 (H) 70 - 99 mg/dL  Phosphorus     Status: Abnormal   Collection Time: 05/04/19  3:20 AM  Result Value Ref Range   Phosphorus 5.4 (H) 2.5 - 4.6 mg/dL    Comment: Performed at [facility], [place] [address], [place], [place] [address]  Magnesium     Status: None   Collection Time: 05/04/19  3:20 AM  Result Value Ref Range   Magnesium 1.9 1.7 - 2.4 mg/dL    Comment: Performed at [facility] [address] [place], [place] [address]  Ferritin     Status: Abnormal   Collection Time: 05/04/19  3:20 AM  Result Value Ref Range   Ferritin 4,177 (H) 24 - 336 ng/mL    Comment: Performed at [facility], [place] [address] [place], [place] [address]  D-dimer, quantitative (not at [facility])     Status: Abnormal   Collection Time: 05/04/19  3:20 AM  Result Value Ref Range   D-Dimer, Quant 2.01 (H) 0.00 - 0.50 ug/mL-FEU    Comment: (NOTE) At the manufacturer cut-off of 0.50 ug/mL FEU, this assay has been documented to exclude PE with a sensitivity and negative predictive value of 97 to 99%.  At this time, this assay has not been approved by the FDA to exclude DVT/VTE. Results should be correlated with clinical presentation. Performed at [facility], [place] [address], [place], [place] [address]   C-reactive protein     Status: Abnormal   Collection Time: 05/04/19  3:20 AM  Result Value Ref Range   CRP 15.0 (H) <1.0 mg/dL    Comment: Performed at [facility] [address] [place] [address] [place], [place] [address]   Basic metabolic panel     Status: Abnormal   Collection Time: 05/04/19  3:20 AM  Result Value Ref Range   Sodium 126 (L) 135 - 145 mmol/L   Potassium 4.2 3.5 - 5.1 mmol/L   Chloride 89 (L) 98 - 111 mmol/L   CO2 19 (L) 22 - 32 mmol/L   Glucose, Bld 171 (H) 70 - 99 mg/dL   BUN 39 (H) 8 - 23 mg/dL   Creatinine, Ser 5.65 (H) 0.61 - 1.24 mg/dL   Calcium 7.6 (L) 8.9 - 10.3 mg/dL   GFR calc non Af Amer 10 (L) >60 mL/min   GFR calc Af Amer 11 (L) >60 mL/min   Anion gap 18 (H) 5 - 15    Comment: Performed at [facility] [address], [place], [place] [address]  Glucose, capillary     Status: Abnormal   Collection Time: 05/04/19  8:27 AM  Result Value Ref Range   Glucose-Capillary 180 (H) 70 - 99 mg/dL  Glucose, capillary     Status: Abnormal  Collection Time: 05/04/19 11:17 AM  Result Value Ref Range   Glucose-Capillary 174 (H) 70 - 99 mg/dL  Glucose, capillary     Status: Abnormal   Collection Time: 05/04/19  4:24 PM  Result Value Ref Range   Glucose-Capillary 204 (H) 70 - 99 mg/dL  Glucose, capillary     Status: Abnormal   Collection Time: 05/04/19  8:59 PM  Result Value Ref Range   Glucose-Capillary 196 (H) 70 - 99 mg/dL  Ferritin     Status: Abnormal   Collection Time: 05/05/19  6:47 AM  Result Value Ref Range   Ferritin 2,898 (H) 24 - 336 ng/mL    Comment: Performed at [facility], [place] [address], [place], [place] [address]  D-dimer, quantitative (not at [facility])     Status: Abnormal   Collection Time: 05/05/19  6:47 AM  Result Value Ref Range   D-Dimer, Quant 1.29 (H) 0.00 - 0.50 ug/mL-FEU    Comment: (NOTE) At the manufacturer cut-off of 0.50 ug/mL FEU, this assay has been documented to exclude PE with a sensitivity and negative predictive value of 97 to 99%.  At this time, this assay has not been approved by the FDA to exclude DVT/VTE. Results should be correlated with clinical presentation. Performed at [facility], [place] [address]  [place], [place] [address]   C-reactive protein     Status: Abnormal   Collection Time: 05/05/19  6:47 AM  Result Value Ref Range   CRP 10.6 (H) <1.0 mg/dL    Comment: Performed at [place] [address], [place], [place] [address]  Renal function panel     Status: Abnormal   Collection Time: 05/05/19  6:47 AM  Result Value Ref Range   Sodium 125 (L) 135 - 145 mmol/L   Potassium 4.2 3.5 - 5.1 mmol/L   Chloride 88 (L) 98 - 111 mmol/L   CO2 25 22 - 32 mmol/L   Glucose, Bld 149 (H) 70 - 99 mg/dL   BUN 56 (H) 8 - 23 mg/dL   Creatinine, Ser 7.47 (H) 0.61 - 1.24 mg/dL   Calcium 7.4 (L) 8.9 - 10.3 mg/dL   Phosphorus 6.4 (H) 2.5 - 4.6 mg/dL   Albumin 2.4 (L) 3.5 - 5.0 g/dL   GFR calc non Af Amer 7 (L) >60 mL/min   GFR calc Af Amer 8 (L) >60 mL/min   Anion gap 12 5 - 15    Comment: Performed at [facility], [address], [place], [place] [address]  Magnesium     Status: None   Collection Time: 05/05/19  6:47 AM  Result Value Ref Range   Magnesium 2.0 1.7 - 2.4 mg/dL    Comment: Performed at [facility] [address] [place], [place] [address]  Glucose, capillary     Status: Abnormal   Collection Time: 05/05/19  8:22 AM  Result Value Ref Range   Glucose-Capillary 149 (H) 70 - 99 mg/dL  Glucose, capillary     Status: Abnormal   Collection Time: 05/05/19 11:39 AM  Result Value Ref Range   Glucose-Capillary 161 (H) 70 - 99 mg/dL    Objective: General: Patient is awake, alert, and oriented x 3 and in no acute distress.  Integument: Skin is warm, dry and supple bilateral. Nails are tender, long, thickened and dystrophic with subungual debris, consistent with onychomycosis, 1-5 bilateral.  No signs of infection. No open lesions or preulcerative lesions present bilateral.  Remaining integument unremarkable.  Vasculature:  Dorsalis Pedis pulse 1/4 bilateral. Posterior Tibial pulse  1/4 bilateral. Capillary fill time <3 sec 1-5 bilateral. Positive hair growth to the  level of the digits.Temperature gradient within normal limits. No varicosities present bilateral. No edema present bilateral.   Neurology: The patient has absent sensation measured with a 5.07/10g Semmes Weinstein Monofilament at all pedal sites bilateral. Vibratory sensation absent bilateral with tuning fork. No Babinski sign present bilateral.   Musculoskeletal: Hammertoe pedal deformities noted bilateral. Muscular strength 5/5 in all lower extremity muscular groups bilateral without pain on range of motion . No tenderness with calf compression bilateral.  Assessment and Plan: Problem List Items Addressed This Visit      Musculoskeletal and Integument   Hammer toes of both feet    Other Visit Diagnoses    Pain due to onychomycosis of toenails of both feet    -  Primary   Diabetic polyneuropathy associated with type 2 diabetes mellitus ([place])       ESRD on dialysis [facility])         -Examined patient. -Discussed and educated patient on diabetic foot care, especially with  regards to the vascular, neurological and musculoskeletal systems.  -Mechanically debrided all nails 1-5 bilateral using sterile nail nipper and filed with dremel without incident  -Answered all patient questions -Patient to return  in 3 months for at risk foot care -Patient advised to call the office if any problems or questions arise in the meantime.  Landis Martins, DPM

## 2019-06-06 DIAGNOSIS — Z992 Dependence on renal dialysis: Secondary | ICD-10-CM | POA: Diagnosis not present

## 2019-06-06 DIAGNOSIS — N2581 Secondary hyperparathyroidism of renal origin: Secondary | ICD-10-CM | POA: Diagnosis not present

## 2019-06-06 DIAGNOSIS — D631 Anemia in chronic kidney disease: Secondary | ICD-10-CM | POA: Diagnosis not present

## 2019-06-06 DIAGNOSIS — D689 Coagulation defect, unspecified: Secondary | ICD-10-CM | POA: Diagnosis not present

## 2019-06-06 DIAGNOSIS — N186 End stage renal disease: Secondary | ICD-10-CM | POA: Diagnosis not present

## 2019-06-09 DIAGNOSIS — R112 Nausea with vomiting, unspecified: Secondary | ICD-10-CM | POA: Diagnosis not present

## 2019-06-09 DIAGNOSIS — E1140 Type 2 diabetes mellitus with diabetic neuropathy, unspecified: Secondary | ICD-10-CM | POA: Diagnosis not present

## 2019-06-09 DIAGNOSIS — K219 Gastro-esophageal reflux disease without esophagitis: Secondary | ICD-10-CM | POA: Diagnosis not present

## 2019-06-11 DIAGNOSIS — Z992 Dependence on renal dialysis: Secondary | ICD-10-CM | POA: Diagnosis not present

## 2019-06-11 DIAGNOSIS — D631 Anemia in chronic kidney disease: Secondary | ICD-10-CM | POA: Diagnosis not present

## 2019-06-11 DIAGNOSIS — N186 End stage renal disease: Secondary | ICD-10-CM | POA: Diagnosis not present

## 2019-06-11 DIAGNOSIS — D689 Coagulation defect, unspecified: Secondary | ICD-10-CM | POA: Diagnosis not present

## 2019-06-11 DIAGNOSIS — N2581 Secondary hyperparathyroidism of renal origin: Secondary | ICD-10-CM | POA: Diagnosis not present

## 2019-06-13 DIAGNOSIS — D631 Anemia in chronic kidney disease: Secondary | ICD-10-CM | POA: Diagnosis not present

## 2019-06-13 DIAGNOSIS — Z992 Dependence on renal dialysis: Secondary | ICD-10-CM | POA: Diagnosis not present

## 2019-06-13 DIAGNOSIS — N2581 Secondary hyperparathyroidism of renal origin: Secondary | ICD-10-CM | POA: Diagnosis not present

## 2019-06-13 DIAGNOSIS — N186 End stage renal disease: Secondary | ICD-10-CM | POA: Diagnosis not present

## 2019-06-13 DIAGNOSIS — D689 Coagulation defect, unspecified: Secondary | ICD-10-CM | POA: Diagnosis not present

## 2019-06-16 DIAGNOSIS — D631 Anemia in chronic kidney disease: Secondary | ICD-10-CM | POA: Diagnosis not present

## 2019-06-16 DIAGNOSIS — N186 End stage renal disease: Secondary | ICD-10-CM | POA: Diagnosis not present

## 2019-06-16 DIAGNOSIS — D689 Coagulation defect, unspecified: Secondary | ICD-10-CM | POA: Diagnosis not present

## 2019-06-16 DIAGNOSIS — N2581 Secondary hyperparathyroidism of renal origin: Secondary | ICD-10-CM | POA: Diagnosis not present

## 2019-06-16 DIAGNOSIS — Z992 Dependence on renal dialysis: Secondary | ICD-10-CM | POA: Diagnosis not present

## 2019-06-18 DIAGNOSIS — N186 End stage renal disease: Secondary | ICD-10-CM | POA: Diagnosis not present

## 2019-06-18 DIAGNOSIS — N2581 Secondary hyperparathyroidism of renal origin: Secondary | ICD-10-CM | POA: Diagnosis not present

## 2019-06-18 DIAGNOSIS — D689 Coagulation defect, unspecified: Secondary | ICD-10-CM | POA: Diagnosis not present

## 2019-06-18 DIAGNOSIS — D631 Anemia in chronic kidney disease: Secondary | ICD-10-CM | POA: Diagnosis not present

## 2019-06-18 DIAGNOSIS — Z992 Dependence on renal dialysis: Secondary | ICD-10-CM | POA: Diagnosis not present

## 2019-06-19 DIAGNOSIS — Z8616 Personal history of COVID-19: Secondary | ICD-10-CM

## 2019-06-19 HISTORY — DX: Personal history of COVID-19: Z86.16

## 2019-06-20 DIAGNOSIS — Z992 Dependence on renal dialysis: Secondary | ICD-10-CM | POA: Diagnosis not present

## 2019-06-20 DIAGNOSIS — D689 Coagulation defect, unspecified: Secondary | ICD-10-CM | POA: Diagnosis not present

## 2019-06-20 DIAGNOSIS — D631 Anemia in chronic kidney disease: Secondary | ICD-10-CM | POA: Diagnosis not present

## 2019-06-20 DIAGNOSIS — N2581 Secondary hyperparathyroidism of renal origin: Secondary | ICD-10-CM | POA: Diagnosis not present

## 2019-06-20 DIAGNOSIS — N186 End stage renal disease: Secondary | ICD-10-CM | POA: Diagnosis not present

## 2019-06-22 DIAGNOSIS — N186 End stage renal disease: Secondary | ICD-10-CM | POA: Diagnosis not present

## 2019-06-22 DIAGNOSIS — Z992 Dependence on renal dialysis: Secondary | ICD-10-CM | POA: Diagnosis not present

## 2019-06-22 DIAGNOSIS — E1122 Type 2 diabetes mellitus with diabetic chronic kidney disease: Secondary | ICD-10-CM | POA: Diagnosis not present

## 2019-06-23 DIAGNOSIS — D509 Iron deficiency anemia, unspecified: Secondary | ICD-10-CM | POA: Diagnosis not present

## 2019-06-23 DIAGNOSIS — N2581 Secondary hyperparathyroidism of renal origin: Secondary | ICD-10-CM | POA: Diagnosis not present

## 2019-06-23 DIAGNOSIS — Z992 Dependence on renal dialysis: Secondary | ICD-10-CM | POA: Diagnosis not present

## 2019-06-23 DIAGNOSIS — N186 End stage renal disease: Secondary | ICD-10-CM | POA: Diagnosis not present

## 2019-06-23 DIAGNOSIS — D689 Coagulation defect, unspecified: Secondary | ICD-10-CM | POA: Diagnosis not present

## 2019-06-23 DIAGNOSIS — D631 Anemia in chronic kidney disease: Secondary | ICD-10-CM | POA: Diagnosis not present

## 2019-06-25 DIAGNOSIS — D689 Coagulation defect, unspecified: Secondary | ICD-10-CM | POA: Diagnosis not present

## 2019-06-25 DIAGNOSIS — D509 Iron deficiency anemia, unspecified: Secondary | ICD-10-CM | POA: Diagnosis not present

## 2019-06-25 DIAGNOSIS — N186 End stage renal disease: Secondary | ICD-10-CM | POA: Diagnosis not present

## 2019-06-25 DIAGNOSIS — D631 Anemia in chronic kidney disease: Secondary | ICD-10-CM | POA: Diagnosis not present

## 2019-06-25 DIAGNOSIS — Z992 Dependence on renal dialysis: Secondary | ICD-10-CM | POA: Diagnosis not present

## 2019-06-25 DIAGNOSIS — N2581 Secondary hyperparathyroidism of renal origin: Secondary | ICD-10-CM | POA: Diagnosis not present

## 2019-06-26 DIAGNOSIS — R809 Proteinuria, unspecified: Secondary | ICD-10-CM | POA: Diagnosis not present

## 2019-06-26 DIAGNOSIS — E1140 Type 2 diabetes mellitus with diabetic neuropathy, unspecified: Secondary | ICD-10-CM | POA: Diagnosis not present

## 2019-06-26 DIAGNOSIS — K219 Gastro-esophageal reflux disease without esophagitis: Secondary | ICD-10-CM | POA: Diagnosis not present

## 2019-06-27 DIAGNOSIS — Z992 Dependence on renal dialysis: Secondary | ICD-10-CM | POA: Diagnosis not present

## 2019-06-27 DIAGNOSIS — N186 End stage renal disease: Secondary | ICD-10-CM | POA: Diagnosis not present

## 2019-06-27 DIAGNOSIS — D631 Anemia in chronic kidney disease: Secondary | ICD-10-CM | POA: Diagnosis not present

## 2019-06-27 DIAGNOSIS — D509 Iron deficiency anemia, unspecified: Secondary | ICD-10-CM | POA: Diagnosis not present

## 2019-06-27 DIAGNOSIS — D689 Coagulation defect, unspecified: Secondary | ICD-10-CM | POA: Diagnosis not present

## 2019-06-27 DIAGNOSIS — N2581 Secondary hyperparathyroidism of renal origin: Secondary | ICD-10-CM | POA: Diagnosis not present

## 2019-06-30 DIAGNOSIS — N2581 Secondary hyperparathyroidism of renal origin: Secondary | ICD-10-CM | POA: Diagnosis not present

## 2019-06-30 DIAGNOSIS — N186 End stage renal disease: Secondary | ICD-10-CM | POA: Diagnosis not present

## 2019-06-30 DIAGNOSIS — R0600 Dyspnea, unspecified: Secondary | ICD-10-CM | POA: Diagnosis not present

## 2019-06-30 DIAGNOSIS — D689 Coagulation defect, unspecified: Secondary | ICD-10-CM | POA: Diagnosis not present

## 2019-06-30 DIAGNOSIS — D631 Anemia in chronic kidney disease: Secondary | ICD-10-CM | POA: Diagnosis not present

## 2019-06-30 DIAGNOSIS — Z992 Dependence on renal dialysis: Secondary | ICD-10-CM | POA: Diagnosis not present

## 2019-06-30 DIAGNOSIS — D509 Iron deficiency anemia, unspecified: Secondary | ICD-10-CM | POA: Diagnosis not present

## 2019-06-30 DIAGNOSIS — R0602 Shortness of breath: Secondary | ICD-10-CM | POA: Diagnosis not present

## 2019-07-02 DIAGNOSIS — N186 End stage renal disease: Secondary | ICD-10-CM | POA: Diagnosis not present

## 2019-07-02 DIAGNOSIS — D509 Iron deficiency anemia, unspecified: Secondary | ICD-10-CM | POA: Diagnosis not present

## 2019-07-02 DIAGNOSIS — D631 Anemia in chronic kidney disease: Secondary | ICD-10-CM | POA: Diagnosis not present

## 2019-07-02 DIAGNOSIS — Z992 Dependence on renal dialysis: Secondary | ICD-10-CM | POA: Diagnosis not present

## 2019-07-02 DIAGNOSIS — D689 Coagulation defect, unspecified: Secondary | ICD-10-CM | POA: Diagnosis not present

## 2019-07-02 DIAGNOSIS — N2581 Secondary hyperparathyroidism of renal origin: Secondary | ICD-10-CM | POA: Diagnosis not present

## 2019-07-04 DIAGNOSIS — D631 Anemia in chronic kidney disease: Secondary | ICD-10-CM | POA: Diagnosis not present

## 2019-07-04 DIAGNOSIS — N186 End stage renal disease: Secondary | ICD-10-CM | POA: Diagnosis not present

## 2019-07-04 DIAGNOSIS — D509 Iron deficiency anemia, unspecified: Secondary | ICD-10-CM | POA: Diagnosis not present

## 2019-07-04 DIAGNOSIS — D689 Coagulation defect, unspecified: Secondary | ICD-10-CM | POA: Diagnosis not present

## 2019-07-04 DIAGNOSIS — N2581 Secondary hyperparathyroidism of renal origin: Secondary | ICD-10-CM | POA: Diagnosis not present

## 2019-07-04 DIAGNOSIS — Z992 Dependence on renal dialysis: Secondary | ICD-10-CM | POA: Diagnosis not present

## 2019-07-05 DIAGNOSIS — T7840XA Allergy, unspecified, initial encounter: Secondary | ICD-10-CM

## 2019-07-05 HISTORY — DX: Allergy, unspecified, initial encounter: T78.40XA

## 2019-07-07 DIAGNOSIS — D689 Coagulation defect, unspecified: Secondary | ICD-10-CM | POA: Diagnosis not present

## 2019-07-07 DIAGNOSIS — D509 Iron deficiency anemia, unspecified: Secondary | ICD-10-CM | POA: Diagnosis not present

## 2019-07-07 DIAGNOSIS — N2581 Secondary hyperparathyroidism of renal origin: Secondary | ICD-10-CM | POA: Diagnosis not present

## 2019-07-07 DIAGNOSIS — N186 End stage renal disease: Secondary | ICD-10-CM | POA: Diagnosis not present

## 2019-07-07 DIAGNOSIS — Z992 Dependence on renal dialysis: Secondary | ICD-10-CM | POA: Diagnosis not present

## 2019-07-07 DIAGNOSIS — D631 Anemia in chronic kidney disease: Secondary | ICD-10-CM | POA: Diagnosis not present

## 2019-07-07 NOTE — Progress Notes (Deleted)
Cardiology Office Note:    Date:  07/07/2019   ID:  James Chan, DOB 1950-08-16, MRN [id number]  PCP:  Cher Nakai, MD  Cardiologist:  Shirlee More, MD    Referring MD: Cher Nakai, MD    ASSESSMENT:    No diagnosis found. PLAN:    In order of problems listed above:  1. ***   Next appointment: ***   Medication Adjustments/Labs and Tests Ordered: Current medicines are reviewed at length with the patient today.  Concerns regarding medicines are outlined above.  No orders of the defined types were placed in this encounter.  No orders of the defined types were placed in this encounter.   No chief complaint on file.   History of Present Illness:    James PREVETTE is a 69 y.o. male with a hx of CAD s/p NSTEMI 04/19/16 with PCI of ISR of RCA with 2 DES and multiple PCI of RCA for restenosis, brief PAF on amiodarone, diastolic CHF, DLD, HTN, PVD, ESRD on HD and anemia. Echo 11/15/2017 with EF 50-55%, mild LVH, mild AR, mod to severe MR, diastolic dysfunction last seen 05/26/2019. He is admitted to the hospital 03/01/2020 with palpitation and dizziness.  He was found to be in a wide-complex tachycardia heart rate greater than 170 bpm and hypotensive and resumed sinus rhythm with IV amiodarone cardiology was consulted and admitted in the hospital is acute coronary syndrome with mildly elevated troponin and proBNP level he underwent coronary angiography and then was noticed to have because of COVID-19 infection.  In retrospect he had a respiratory symptoms for 2 weeks did not become hypoxic and chest x-ray did not show pulmonary infiltrates. Compliance with diet, lifestyle and medications: *** Past Medical History:  Diagnosis Date  . Anemia   . Anxiety state, unspecified 09/15/2008   Qualifier: Diagnosis of  By: Bullins CMA, Ami  , patient denies  . Arterial insufficiency of lower extremity ([place]) 05/10/2016  . Arthritis    elbows  . BACK PAIN, LUMBAR, CHRONIC 09/15/2008   Qualifier: Diagnosis of  By: Bullins CMA, Ami    . CHF (congestive heart failure) ([place])   . Claudication ([place]) 10/30/2014  . Comprehensive diabetic foot examination, type 2 DM, encounter for [facility]) 09/15/2008   Qualifier: Diagnosis of  By: Bullins CMA, Ami    . DEPRESSIVE DISORDER 09/15/2008   Qualifier: Diagnosis of  By: Bullins CMA, Ami    . Diabetes mellitus without complication ([place])   . Diabetic polyneuropathy associated with diabetes mellitus due to underlying condition ([place]) 06/28/2015  . Dyslipidemia 11/24/2015  . Dyspnea    with exertion  . End stage renal disease on dialysis ([place])    M/W/F [place]  . Esophageal reflux 09/15/2008   Qualifier: Diagnosis of  By: Bullins CMA, Ami    . Glaucoma   . Gout   . Hammer toes of both feet 06/28/2015  . History of blood transfusion   . History of carotid artery stenosis 10/30/2014  . 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 Hyperlipidemia   . Hypertension   . Hypertensive heart failure ([place]) 11/28/2016  . HYPERTRIGLYCERIDEMIA 09/15/2008   Qualifier: Diagnosis of  By: Bullins CMA, Ami    . Myocardial infarction ([place]) 1997  . NSTEMI (non-ST elevated myocardial infarction) ([place]) 09/15/2008   Qualifier: Diagnosis of  By: Bullins CMA, Ami    . Onychomycosis due to dermatophyte 06/28/2015  . PAD (peripheral artery disease) ([place]) 10/30/2014  . Sleep apnea  no CPAP  . Stroke [facility])    no residual    Past Surgical History:  Procedure Laterality Date  . angioplasty of iliofemoral and iliac artery with stent placement    . AV FISTULA PLACEMENT Right 07/04/2018   Procedure: CREATION BASILIC VEIN ARTERIOVENOUS FISTULA RIGHT ARM;  Surgeon: [name], MD;  Location: [place];  Service: Vascular;  Laterality: Right;  . BASCILIC VEIN TRANSPOSITION Right 10/10/2018   Procedure: BASILIC VEIN TRANSPOSITION SECOND STAGE Right upper arm;  Surgeon: [name], MD;  Location: [place];  Service: Vascular;  Laterality: Right;  . CARDIAC  CATHETERIZATION    . CAROTID STENT Bilateral   . CATARACT EXTRACTION W/ INTRAOCULAR LENS  IMPLANT, BILATERAL    . COLONOSCOPY W/ POLYPECTOMY    . CORONARY ANGIOPLASTY     stents x 5  . INSERTION OF DIALYSIS CATHETER    . KNEE SURGERY     right/ left worked on ligaments and tendons  . LEFT HEART CATH AND CORONARY ANGIOGRAPHY N/A 05/02/2019   Procedure: LEFT HEART CATH AND CORONARY ANGIOGRAPHY;  Surgeon: Nelva Bush, MD;  Location: [place] [facility];  Service: Cardiovascular;  Laterality: N/A;  . OTHER SURGICAL HISTORY     reports 32 stents to include being in eye    Current Medications: No outpatient medications have been marked as taking for the 07/08/19 encounter (Appointment) with Richardo Priest, MD.     Allergies:   Patient has no known allergies.   Social History   Socioeconomic History  . Marital status: Married    Spouse name: Not on file  . Number of children: Not on file  . Years of education: Not on file  . Highest education level: Not on file  Occupational History  . Not on file  Tobacco Use  . Smoking status: Former Smoker    Years: 36.00    Quit date: 05/10/1995    Years since quitting: 24.1  . Smokeless tobacco: Never Used  Substance and Sexual Activity  . Alcohol use: No  . Drug use: No  . Sexual activity: Not on file  Other Topics Concern  . Not on file  Social History Narrative  . Not on file   Social Determinants of Health   Financial Resource Strain:   . Difficulty of Paying Living Expenses:   Food Insecurity:   . Worried About Charity fundraiser in the Last Year:   . Arboriculturist in the Last Year:   Transportation Needs:   . Film/video editor (Medical):   Marland Kitchen Lack of Transportation (Non-Medical):   Physical Activity:   . Days of Exercise per Week:   . Minutes of Exercise per Session:   Stress:   . Feeling of Stress :   Social Connections:   . Frequency of Communication with Friends and Family:   . Frequency of Social  Gatherings with Friends and Family:   . Attends Religious Services:   . Active Member of Clubs or Organizations:   . Attends Archivist Meetings:   Marland Kitchen Marital Status:      Family History: The patient's ***family history includes Cancer in his father and mother; Heart disease in his father and mother. ROS:   Please see the history of present illness.    All other systems reviewed and are negative.  EKGs/Labs/Other Studies Reviewed:    The following studies were reviewed today:  EKG:  EKG ordered today and personally reviewed.  The ekg  ordered today demonstrates ***  Recent Labs: 12/05/2018: TSH 3.740 05/02/2019: B Natriuretic Peptide 2,378.8 05/03/2019: ALT 14; Hemoglobin 8.9; Platelets 317 05/05/2019: BUN 56; Creatinine, Ser 7.47; Magnesium 2.0; Potassium 4.2; Sodium 125  Recent Lipid Panel    Component Value Date/Time   CHOL 80 05/03/2019 0111   CHOL 155 12/05/2018 1010   TRIG 56 05/03/2019 0111   HDL 21 (L) 05/03/2019 0111   HDL 40 12/05/2018 1010   CHOLHDL 3.8 05/03/2019 0111   VLDL 11 05/03/2019 0111   LDLCALC 48 05/03/2019 0111   LDLCALC 97 12/05/2018 1010    Physical Exam:    VS:  There were no vitals taken for this visit.    Wt Readings from Last 3 Encounters:  05/26/19 190 lb 3.2 oz (86.3 kg)  05/05/19 201 lb 8 oz (91.4 kg)  03/04/19 203 lb 12.8 oz (92.4 kg)     GEN: *** Well nourished, well developed in no acute distress HEENT: Normal NECK: No JVD; No carotid bruits LYMPHATICS: No lymphadenopathy CARDIAC: ***RRR, no murmurs, rubs, gallops RESPIRATORY:  Clear to auscultation without rales, wheezing or rhonchi  ABDOMEN: Soft, non-tender, non-distended MUSCULOSKELETAL:  No edema; No deformity  SKIN: Warm and dry NEUROLOGIC:  Alert and oriented x 3 PSYCHIATRIC:  Normal affect    Signed, Shirlee More, MD  07/07/2019 8:37 AM    [place]

## 2019-07-08 ENCOUNTER — Ambulatory Visit: Payer: Medicare Other | Admitting: Cardiology

## 2019-07-09 DIAGNOSIS — Z992 Dependence on renal dialysis: Secondary | ICD-10-CM | POA: Diagnosis not present

## 2019-07-09 DIAGNOSIS — D631 Anemia in chronic kidney disease: Secondary | ICD-10-CM | POA: Diagnosis not present

## 2019-07-09 DIAGNOSIS — N2581 Secondary hyperparathyroidism of renal origin: Secondary | ICD-10-CM | POA: Diagnosis not present

## 2019-07-09 DIAGNOSIS — D689 Coagulation defect, unspecified: Secondary | ICD-10-CM | POA: Diagnosis not present

## 2019-07-09 DIAGNOSIS — N186 End stage renal disease: Secondary | ICD-10-CM | POA: Diagnosis not present

## 2019-07-09 DIAGNOSIS — D509 Iron deficiency anemia, unspecified: Secondary | ICD-10-CM | POA: Diagnosis not present

## 2019-07-11 DIAGNOSIS — Z794 Long term (current) use of insulin: Secondary | ICD-10-CM | POA: Diagnosis not present

## 2019-07-11 DIAGNOSIS — D631 Anemia in chronic kidney disease: Secondary | ICD-10-CM | POA: Diagnosis not present

## 2019-07-11 DIAGNOSIS — N186 End stage renal disease: Secondary | ICD-10-CM | POA: Diagnosis not present

## 2019-07-11 DIAGNOSIS — H401131 Primary open-angle glaucoma, bilateral, mild stage: Secondary | ICD-10-CM | POA: Diagnosis not present

## 2019-07-11 DIAGNOSIS — D689 Coagulation defect, unspecified: Secondary | ICD-10-CM | POA: Diagnosis not present

## 2019-07-11 DIAGNOSIS — D509 Iron deficiency anemia, unspecified: Secondary | ICD-10-CM | POA: Diagnosis not present

## 2019-07-11 DIAGNOSIS — Z992 Dependence on renal dialysis: Secondary | ICD-10-CM | POA: Diagnosis not present

## 2019-07-11 DIAGNOSIS — N2581 Secondary hyperparathyroidism of renal origin: Secondary | ICD-10-CM | POA: Diagnosis not present

## 2019-07-11 DIAGNOSIS — E113293 Type 2 diabetes mellitus with mild nonproliferative diabetic retinopathy without macular edema, bilateral: Secondary | ICD-10-CM | POA: Diagnosis not present

## 2019-07-14 DIAGNOSIS — D631 Anemia in chronic kidney disease: Secondary | ICD-10-CM | POA: Diagnosis not present

## 2019-07-14 DIAGNOSIS — D689 Coagulation defect, unspecified: Secondary | ICD-10-CM | POA: Diagnosis not present

## 2019-07-14 DIAGNOSIS — Z992 Dependence on renal dialysis: Secondary | ICD-10-CM | POA: Diagnosis not present

## 2019-07-14 DIAGNOSIS — N2581 Secondary hyperparathyroidism of renal origin: Secondary | ICD-10-CM | POA: Diagnosis not present

## 2019-07-14 DIAGNOSIS — D509 Iron deficiency anemia, unspecified: Secondary | ICD-10-CM | POA: Diagnosis not present

## 2019-07-14 DIAGNOSIS — N186 End stage renal disease: Secondary | ICD-10-CM | POA: Diagnosis not present

## 2019-07-16 DIAGNOSIS — D689 Coagulation defect, unspecified: Secondary | ICD-10-CM | POA: Diagnosis not present

## 2019-07-16 DIAGNOSIS — N186 End stage renal disease: Secondary | ICD-10-CM | POA: Diagnosis not present

## 2019-07-16 DIAGNOSIS — D509 Iron deficiency anemia, unspecified: Secondary | ICD-10-CM | POA: Diagnosis not present

## 2019-07-16 DIAGNOSIS — N2581 Secondary hyperparathyroidism of renal origin: Secondary | ICD-10-CM | POA: Diagnosis not present

## 2019-07-16 DIAGNOSIS — D631 Anemia in chronic kidney disease: Secondary | ICD-10-CM | POA: Diagnosis not present

## 2019-07-16 DIAGNOSIS — Z992 Dependence on renal dialysis: Secondary | ICD-10-CM | POA: Diagnosis not present

## 2019-07-18 DIAGNOSIS — Z992 Dependence on renal dialysis: Secondary | ICD-10-CM | POA: Diagnosis not present

## 2019-07-18 DIAGNOSIS — D509 Iron deficiency anemia, unspecified: Secondary | ICD-10-CM | POA: Diagnosis not present

## 2019-07-18 DIAGNOSIS — D631 Anemia in chronic kidney disease: Secondary | ICD-10-CM | POA: Diagnosis not present

## 2019-07-18 DIAGNOSIS — N186 End stage renal disease: Secondary | ICD-10-CM | POA: Diagnosis not present

## 2019-07-18 DIAGNOSIS — D689 Coagulation defect, unspecified: Secondary | ICD-10-CM | POA: Diagnosis not present

## 2019-07-18 DIAGNOSIS — N2581 Secondary hyperparathyroidism of renal origin: Secondary | ICD-10-CM | POA: Diagnosis not present

## 2019-07-21 ENCOUNTER — Other Ambulatory Visit: Payer: Self-pay | Admitting: Cardiology

## 2019-07-21 DIAGNOSIS — N186 End stage renal disease: Secondary | ICD-10-CM | POA: Diagnosis not present

## 2019-07-21 DIAGNOSIS — D631 Anemia in chronic kidney disease: Secondary | ICD-10-CM | POA: Diagnosis not present

## 2019-07-21 DIAGNOSIS — D509 Iron deficiency anemia, unspecified: Secondary | ICD-10-CM | POA: Diagnosis not present

## 2019-07-21 DIAGNOSIS — N2581 Secondary hyperparathyroidism of renal origin: Secondary | ICD-10-CM | POA: Diagnosis not present

## 2019-07-21 DIAGNOSIS — D689 Coagulation defect, unspecified: Secondary | ICD-10-CM | POA: Diagnosis not present

## 2019-07-21 DIAGNOSIS — Z992 Dependence on renal dialysis: Secondary | ICD-10-CM | POA: Diagnosis not present

## 2019-07-23 ENCOUNTER — Other Ambulatory Visit: Payer: Self-pay

## 2019-07-23 DIAGNOSIS — D689 Coagulation defect, unspecified: Secondary | ICD-10-CM | POA: Diagnosis not present

## 2019-07-23 DIAGNOSIS — Z992 Dependence on renal dialysis: Secondary | ICD-10-CM | POA: Diagnosis not present

## 2019-07-23 DIAGNOSIS — E1122 Type 2 diabetes mellitus with diabetic chronic kidney disease: Secondary | ICD-10-CM | POA: Diagnosis not present

## 2019-07-23 DIAGNOSIS — D509 Iron deficiency anemia, unspecified: Secondary | ICD-10-CM | POA: Diagnosis not present

## 2019-07-23 DIAGNOSIS — N186 End stage renal disease: Secondary | ICD-10-CM | POA: Diagnosis not present

## 2019-07-23 DIAGNOSIS — N2581 Secondary hyperparathyroidism of renal origin: Secondary | ICD-10-CM | POA: Diagnosis not present

## 2019-07-23 DIAGNOSIS — D631 Anemia in chronic kidney disease: Secondary | ICD-10-CM | POA: Diagnosis not present

## 2019-07-24 ENCOUNTER — Other Ambulatory Visit: Payer: Self-pay

## 2019-07-24 ENCOUNTER — Encounter: Payer: Self-pay | Admitting: Cardiology

## 2019-07-24 ENCOUNTER — Ambulatory Visit: Payer: Medicare Other | Admitting: Cardiology

## 2019-07-24 VITALS — BP 120/60 | HR 63 | Ht 72.0 in | Wt 184.4 lb

## 2019-07-24 DIAGNOSIS — R809 Proteinuria, unspecified: Secondary | ICD-10-CM | POA: Diagnosis not present

## 2019-07-24 DIAGNOSIS — I110 Hypertensive heart disease with heart failure: Secondary | ICD-10-CM

## 2019-07-24 DIAGNOSIS — E1140 Type 2 diabetes mellitus with diabetic neuropathy, unspecified: Secondary | ICD-10-CM | POA: Diagnosis not present

## 2019-07-24 DIAGNOSIS — E782 Mixed hyperlipidemia: Secondary | ICD-10-CM | POA: Diagnosis not present

## 2019-07-24 DIAGNOSIS — Z79899 Other long term (current) drug therapy: Secondary | ICD-10-CM

## 2019-07-24 DIAGNOSIS — R0902 Hypoxemia: Secondary | ICD-10-CM

## 2019-07-24 DIAGNOSIS — I480 Paroxysmal atrial fibrillation: Secondary | ICD-10-CM

## 2019-07-24 DIAGNOSIS — R0602 Shortness of breath: Secondary | ICD-10-CM | POA: Diagnosis not present

## 2019-07-24 DIAGNOSIS — K219 Gastro-esophageal reflux disease without esophagitis: Secondary | ICD-10-CM | POA: Diagnosis not present

## 2019-07-24 DIAGNOSIS — I5042 Chronic combined systolic (congestive) and diastolic (congestive) heart failure: Secondary | ICD-10-CM

## 2019-07-24 MED ORDER — AMIODARONE HCL 200 MG PO TABS: 200.0000 mg | ORAL_TABLET | Freq: Every day | ORAL | 1 refills | Status: DC

## 2019-07-24 NOTE — Progress Notes (Signed)
Cardiology Office Note:    Date:  07/24/2019   ID:  James Chan, DOB January 22, 1951, MRN [id number]  PCP:  Cher Nakai, MD  Cardiologist:  Shirlee More, MD    Referring MD: Cher Nakai, MD    ASSESSMENT:    1. PAF (paroxysmal atrial fibrillation) ([name])   2. On amiodarone therapy   3. Hypertensive heart disease with chronic combined systolic and diastolic congestive heart failure ([name])   4. Mixed hyperlipidemia    PLAN:    In order of problems listed above:  1. Table continue amiodarone reduced to maintenance dose check thyroid for toxicity follow-up in 3 months.  Presently not anticoagulated with stage V CKD on maintenance hemodialysis 2. Hypertension at target he has no fluid overload continue his current antihypertensive medications we will try nitroglycerin at bedtime to alleviate shortness of breath but I suspect he has nocturnal hypoxemia normal range of home oxygen sat monitor. 3. Continue with statin with CAD profile 05/03/2019 cholesterol 80 LDL 48 HDL 41 triglycerides 56 statins at target table CAD New York Heart Association class I   Next appointment: 3 months   Medication Adjustments/Labs and Tests Ordered: Current medicines are reviewed at length with the patient today.  Concerns regarding medicines are outlined above.  No orders of the defined types were placed in this encounter.  No orders of the defined types were placed in this encounter.   Chief Complaint  Patient presents with   Follow-up   Atrial Fibrillation    History of Present Illness:    [name] is a 69 y.o. male with a hx of CAD s/p NSTEMI 04/19/16 with PCI of ISR of RCA with 2 DES and multiple PCI of RCA for restenosis, brief PAF on amiodarone, diastolic CHF, DLD, HTN, PVD, ESRD on HD and anemia. Echo 11/15/2017 with EF 50-55%, mild LVH, mild AR, mod to severe MR, diastolic dysfunction last seen 05/26/2019.He was admitted to the hospital 03/01/2020 with palpitation and dizziness.  He was  found to be in a wide-complex tachycardia heart rate greater than 170 bpm and hypotensive.  He Resumed sinus rhythm with IV amiodarone cardiology was consulted and admitted in the hospital is acute coronary syndrome with mildly elevated troponin and proBNP level he underwent coronary angiography and then was noticed to have because of COVID-19 infection.  In retrospect he had a respiratory symptoms for 2 weeks did not become hypoxic and chest x-ray did not show pulmonary infiltrates.  He was transition from IV to oral amiodarone and was treated with dexamethasone remdesivir and Actemra   Compliance with diet, lifestyle and medications: Yes  In general he is doing much better his weights are stable tolerates dialysis without hypotension but he is hypoxic on dialysis they mention he may require oxygen and a short of breath at nighttime.  He is not fluid overloaded.  We will try to set up a home oxygen monitor I suspect he requires oxygen nocturnally for hypoxia.  He is having no angina palpitations syncope he is on amiodarone 40 mg daily will reduce to 200/day maintenance and asked the dialysis center check his thyroid studies. Past Medical History:  Diagnosis Date   Anemia    Anxiety state, unspecified 09/15/2008   Qualifier: Diagnosis of  By: Bullins CMA, Ami  , patient denies   Arterial insufficiency of lower extremity ([name]) 05/10/2016   Arthritis    elbows   BACK PAIN, LUMBAR, CHRONIC 09/15/2008   Qualifier: Diagnosis of  By: Bullins CMA,  Ami     CHF (congestive heart failure) ([place])    Claudication ([place]) 10/30/2014   Comprehensive diabetic foot examination, type 2 DM, encounter for ([name]) 09/15/2008   Qualifier: Diagnosis of  By: Bullins CMA, Ami     DEPRESSIVE DISORDER 09/15/2008   Qualifier: Diagnosis of  By: Bullins CMA, Ami     Diabetes mellitus without complication ([name])    Diabetic polyneuropathy associated with diabetes mellitus due to underlying condition ([name]) 06/28/2015    Dyslipidemia 11/24/2015   Dyspnea    with exertion   End stage renal disease on dialysis [facility])    M/W/F    Esophageal reflux 09/15/2008   Qualifier: Diagnosis of  By: Bullins CMA, Ami     Glaucoma    Gout    Hammer toes of both feet 06/28/2015   History of blood transfusion    History of carotid artery stenosis 10/30/2014   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   Hyperlipidemia    Hypertension    Hypertensive heart failure ([name]) 11/28/2016   HYPERTRIGLYCERIDEMIA 09/15/2008   Qualifier: Diagnosis of  By: Bullins CMA, Ami     Myocardial infarction ([name]) 1997   NSTEMI (non-ST elevated myocardial infarction) ([name]) 09/15/2008   Qualifier: Diagnosis of  By: Bullins CMA, Ami     Onychomycosis due to dermatophyte 06/28/2015   PAD (peripheral artery disease) ([name]) 10/30/2014   Sleep apnea    no CPAP   Stroke ([name])    no residual    Past Surgical History:  Procedure Laterality Date   angioplasty of iliofemoral and iliac artery with stent placement     AV FISTULA PLACEMENT Right 07/04/2018   Procedure: CREATION BASILIC VEIN ARTERIOVENOUS FISTULA RIGHT ARM;  Surgeon: [name], MD;  Location: [name];  Service: Vascular;  Laterality: Right;   [name] Right 10/10/2018   Procedure: BASILIC VEIN TRANSPOSITION SECOND STAGE Right upper arm;  Surgeon: [name], MD;  Location: [name];  Service: Vascular;  Laterality: Right;   CARDIAC CATHETERIZATION     CAROTID STENT Bilateral    CATARACT EXTRACTION W/ INTRAOCULAR LENS  IMPLANT, BILATERAL     COLONOSCOPY W/ POLYPECTOMY     CORONARY ANGIOPLASTY     stents x 5   INSERTION OF DIALYSIS CATHETER     KNEE SURGERY     right/ left worked on ligaments and tendons   LEFT HEART CATH AND CORONARY ANGIOGRAPHY N/A 05/02/2019   Procedure: LEFT HEART CATH AND CORONARY ANGIOGRAPHY;  Surgeon: Nelva Bush, MD;  Location: [name] [facility];  Service: Cardiovascular;  Laterality:  N/A;   OTHER SURGICAL HISTORY     reports 32 stents to include being in eye    Current Medications: Current Meds  Medication Sig   ACCU-CHEK AVIVA PLUS test strip daily.   Accu-Chek Softclix Lancets lancets USE AS DIRECTED DAILY AS NEEDED   allopurinol (ZYLOPRIM) 100 MG tablet Take 100 mg by mouth at bedtime.    amiodarone (PACERONE) 200 MG tablet Take 1 tablet (200 mg total) by mouth 2 (two) times daily.   aspirin 81 MG chewable tablet Chew 1 tablet (81 mg total) by mouth daily.   atorvastatin (LIPITOR) 40 MG tablet TAKE 1 TABLET BY MOUTH IN  THE EVENING   cloNIDine (CATAPRES) 0.1 MG tablet Take 1 tablet (0.1 mg total) by mouth 2 (two) times daily as needed (if systolic blood pressure above 160 mmHg). Take 0.1mg  (one tablet) on non-hemodialysis days if systolic blood pressure is >  160.   clopidogrel (PLAVIX) 75 MG tablet Take 75 mg by mouth daily.    dicyclomine (BENTYL) 10 MG capsule Take 10 mg by mouth every 6 (six) hours as needed.   hydrALAZINE (APRESOLINE) 25 MG tablet Take 25 mg by mouth 3 (three) times daily.    Insulin Glargine (TOUJEO SOLOSTAR [place]) Inject 30 Units into the skin at bedtime.   Insulin Pen Needle (NOVOTWIST) 32G X 5 MM MISC    latanoprost (XALATAN) 0.005 % ophthalmic solution Place 1 drop into both eyes at bedtime.   lidocaine-prilocaine (EMLA) cream Apply 1 application topically See admin instructions. Apply small amount to access site 1 to 2 hours before dialysis then cover with saran wrap.   Methoxy PEG-Epoetin Beta (MIRCERA IJ) Mircera   metoprolol tartrate (LOPRESSOR) 25 MG tablet Take 25 mg by mouth 2 (two) times daily.    midodrine (PROAMATINE) 5 MG tablet Take 1 tablet by mouth three times a week as directed Take 30 minutes prior to HD treatment three times a week   nitroGLYCERIN (NITROSTAT) 0.4 MG SL tablet Place 1 tablet (0.4 mg total) under the tongue every 5 (five) minutes as needed for chest pain.   oxyCODONE-acetaminophen (PERCOCET)  5-325 MG tablet Take 1 tablet by mouth every 4 (four) hours as needed for severe pain.   polyethylene glycol (MIRALAX / GLYCOLAX) packet Take 17 g by mouth daily as needed for mild constipation.   PROAIR HFA 108 (90 Base) MCG/ACT inhaler Inhale 2 puffs into the lungs every 6 (six) hours as needed for wheezing or shortness of breath.    promethazine (PHENERGAN) 25 MG tablet Take 25 mg by mouth every 6 (six) hours as needed.     Allergies:   Patient has no known allergies.   Social History   Socioeconomic History   Marital status: Married    Spouse name: Not on file   Number of children: Not on file   Years of education: Not on file   Highest education level: Not on file  Occupational History   Not on file  Tobacco Use   Smoking status: Former Smoker    Years: 36.00    Quit date: 05/10/1995    Years since quitting: 24.2   Smokeless tobacco: Never Used  Substance and Sexual Activity   Alcohol use: No   Drug use: No   Sexual activity: Not on file  Other Topics Concern   Not on file  Social History Narrative   Not on file   Social Determinants of Health   Financial Resource Strain:    Difficulty of Paying Living Expenses:   Food Insecurity:    Worried About Charity fundraiser in the Last Year:    Arboriculturist in the Last Year:   Transportation Needs:    Film/video editor (Medical):    Lack of Transportation (Non-Medical):   Physical Activity:    Days of Exercise per Week:    Minutes of Exercise per Session:   Stress:    Feeling of Stress :   Social Connections:    Frequency of Communication with Friends and Family:    Frequency of Social Gatherings with Friends and Family:    Attends Religious Services:    Active Member of Clubs or Organizations:    Attends Archivist Meetings:    Marital Status:      Family History: The patient's family history includes Cancer in his father and mother; Heart disease in his father and  mother. ROS:   Please see the history of present illness.    All other systems reviewed and are negative.  EKGs/Labs/Other Studies Reviewed:    The following studies were reviewed today:  EKG:  EKG ordered today and personally reviewed.  The ekg ordered today demonstrates sinus rhythm left bundle branch block  Recent Labs: 12/05/2018: TSH 3.740 05/02/2019: B Natriuretic Peptide 2,378.8 05/03/2019: ALT 14; Hemoglobin 8.9; Platelets 317 05/05/2019: BUN 56; Creatinine, Ser 7.47; Magnesium 2.0; Potassium 4.2; Sodium 125  Recent Lipid Panel    Component Value Date/Time   CHOL 80 05/03/2019 0111   CHOL 155 12/05/2018 1010   TRIG 56 05/03/2019 0111   HDL 21 (L) 05/03/2019 0111   HDL 40 12/05/2018 1010   CHOLHDL 3.8 05/03/2019 0111   VLDL 11 05/03/2019 0111   LDLCALC 48 05/03/2019 0111   LDLCALC 97 12/05/2018 1010    Physical Exam:    VS:  BP 120/60    Pulse 63    Ht 6' (1.829 m)    Wt 184 lb 6.4 oz (83.6 kg)    SpO2 100%    BMI 25.01 kg/m     Wt Readings from Last 3 Encounters:  07/24/19 184 lb 6.4 oz (83.6 kg)  05/26/19 190 lb 3.2 oz (86.3 kg)  05/05/19 201 lb 8 oz (91.4 kg)    GEN: Much healthier well nourished, well developed in no acute distress HEENT: Normal NECK: No JVD; No carotid bruits LYMPHATICS: No lymphadenopathy CARDIAC: RRR, no murmurs, rubs, gallops RESPIRATORY:  Clear to auscultation without rales, wheezing or rhonchi  ABDOMEN: Soft, non-tender, non-distended MUSCULOSKELETAL:  No edema; No deformity  SKIN: Warm and dry NEUROLOGIC:  Alert and oriented x 3 PSYCHIATRIC:  Normal affect    Signed, Shirlee More, MD  07/24/2019 9:36 AM    [place]

## 2019-07-24 NOTE — Addendum Note (Signed)
Addended by: Resa Miner I on: 07/24/2019 02:37 PM   Modules accepted: Orders

## 2019-07-24 NOTE — Patient Instructions (Signed)
Medication Instructions:  Your physician has recommended you make the following change in your medication:  DECREASE Amiodarone to 200 mg take one tablet by mouth once a day.  *If you need a refill on your cardiac medications before your next appointment, please call your pharmacy*   Lab Work: Your physician recommends that you return for lab work in:  Please have them draw a TSH, T3, and T4 at your next hemodialysis appointment.  If you have labs (blood work) drawn today and your tests are completely normal, you will receive your results only by: Marland Kitchen MyChart Message (if you have MyChart) OR . A paper copy in the mail If you have any lab test that is abnormal or we need to change your treatment, we will call you to review the results.   Testing/Procedures: None   Follow-Up: At [facility], you and your health needs are our priority.  As part of our continuing mission to provide you with exceptional heart care, we have created designated Provider Care Teams.  These Care Teams include your primary Cardiologist (physician) and Advanced Practice Providers (APPs -  Physician Assistants and Nurse Practitioners) who all work together to provide you with the care you need, when you need it.  We recommend signing up for the patient portal called "MyChart".  Sign up information is provided on this After Visit Summary.  MyChart is used to connect with patients for Virtual Visits (Telemedicine).  Patients are able to view lab/test results, encounter notes, upcoming appointments, etc.  Non-urgent messages can be sent to your provider as well.   To learn more about what you can do with MyChart, go to [email or web address].    Your next appointment:   3 month(s)  The format for your next appointment:   In Person  Provider:   Shirlee More, MD   Other Instructions We will be getting in touch with [facility] in regards to getting you a 24 hour saturation monitor to check your oxygenation  level and see if you are in need of home oxygen.

## 2019-07-25 DIAGNOSIS — E039 Hypothyroidism, unspecified: Secondary | ICD-10-CM | POA: Diagnosis not present

## 2019-07-25 DIAGNOSIS — N186 End stage renal disease: Secondary | ICD-10-CM | POA: Diagnosis not present

## 2019-07-25 DIAGNOSIS — E1129 Type 2 diabetes mellitus with other diabetic kidney complication: Secondary | ICD-10-CM | POA: Diagnosis not present

## 2019-07-25 DIAGNOSIS — D631 Anemia in chronic kidney disease: Secondary | ICD-10-CM | POA: Diagnosis not present

## 2019-07-25 DIAGNOSIS — Z992 Dependence on renal dialysis: Secondary | ICD-10-CM | POA: Diagnosis not present

## 2019-07-25 DIAGNOSIS — N2581 Secondary hyperparathyroidism of renal origin: Secondary | ICD-10-CM | POA: Diagnosis not present

## 2019-07-25 DIAGNOSIS — D689 Coagulation defect, unspecified: Secondary | ICD-10-CM | POA: Diagnosis not present

## 2019-07-28 DIAGNOSIS — Z992 Dependence on renal dialysis: Secondary | ICD-10-CM | POA: Diagnosis not present

## 2019-07-28 DIAGNOSIS — N2581 Secondary hyperparathyroidism of renal origin: Secondary | ICD-10-CM | POA: Diagnosis not present

## 2019-07-28 DIAGNOSIS — D631 Anemia in chronic kidney disease: Secondary | ICD-10-CM | POA: Diagnosis not present

## 2019-07-28 DIAGNOSIS — N186 End stage renal disease: Secondary | ICD-10-CM | POA: Diagnosis not present

## 2019-07-28 DIAGNOSIS — E1129 Type 2 diabetes mellitus with other diabetic kidney complication: Secondary | ICD-10-CM | POA: Diagnosis not present

## 2019-07-28 DIAGNOSIS — D689 Coagulation defect, unspecified: Secondary | ICD-10-CM | POA: Diagnosis not present

## 2019-07-28 DIAGNOSIS — E039 Hypothyroidism, unspecified: Secondary | ICD-10-CM | POA: Diagnosis not present

## 2019-07-30 DIAGNOSIS — E039 Hypothyroidism, unspecified: Secondary | ICD-10-CM | POA: Diagnosis not present

## 2019-07-30 DIAGNOSIS — N186 End stage renal disease: Secondary | ICD-10-CM | POA: Diagnosis not present

## 2019-07-30 DIAGNOSIS — D631 Anemia in chronic kidney disease: Secondary | ICD-10-CM | POA: Diagnosis not present

## 2019-07-30 DIAGNOSIS — Z992 Dependence on renal dialysis: Secondary | ICD-10-CM | POA: Diagnosis not present

## 2019-07-30 DIAGNOSIS — N2581 Secondary hyperparathyroidism of renal origin: Secondary | ICD-10-CM | POA: Diagnosis not present

## 2019-07-30 DIAGNOSIS — D689 Coagulation defect, unspecified: Secondary | ICD-10-CM | POA: Diagnosis not present

## 2019-07-30 DIAGNOSIS — E1129 Type 2 diabetes mellitus with other diabetic kidney complication: Secondary | ICD-10-CM | POA: Diagnosis not present

## 2019-08-01 DIAGNOSIS — N186 End stage renal disease: Secondary | ICD-10-CM | POA: Diagnosis not present

## 2019-08-01 DIAGNOSIS — E039 Hypothyroidism, unspecified: Secondary | ICD-10-CM | POA: Diagnosis not present

## 2019-08-01 DIAGNOSIS — D689 Coagulation defect, unspecified: Secondary | ICD-10-CM | POA: Diagnosis not present

## 2019-08-01 DIAGNOSIS — N2581 Secondary hyperparathyroidism of renal origin: Secondary | ICD-10-CM | POA: Diagnosis not present

## 2019-08-01 DIAGNOSIS — Z992 Dependence on renal dialysis: Secondary | ICD-10-CM | POA: Diagnosis not present

## 2019-08-01 DIAGNOSIS — D631 Anemia in chronic kidney disease: Secondary | ICD-10-CM | POA: Diagnosis not present

## 2019-08-01 DIAGNOSIS — E1129 Type 2 diabetes mellitus with other diabetic kidney complication: Secondary | ICD-10-CM | POA: Diagnosis not present

## 2019-08-04 DIAGNOSIS — E1129 Type 2 diabetes mellitus with other diabetic kidney complication: Secondary | ICD-10-CM | POA: Diagnosis not present

## 2019-08-04 DIAGNOSIS — E039 Hypothyroidism, unspecified: Secondary | ICD-10-CM | POA: Diagnosis not present

## 2019-08-04 DIAGNOSIS — D631 Anemia in chronic kidney disease: Secondary | ICD-10-CM | POA: Diagnosis not present

## 2019-08-04 DIAGNOSIS — Z992 Dependence on renal dialysis: Secondary | ICD-10-CM | POA: Diagnosis not present

## 2019-08-04 DIAGNOSIS — D689 Coagulation defect, unspecified: Secondary | ICD-10-CM | POA: Diagnosis not present

## 2019-08-04 DIAGNOSIS — N186 End stage renal disease: Secondary | ICD-10-CM | POA: Diagnosis not present

## 2019-08-04 DIAGNOSIS — N2581 Secondary hyperparathyroidism of renal origin: Secondary | ICD-10-CM | POA: Diagnosis not present

## 2019-08-06 DIAGNOSIS — N2581 Secondary hyperparathyroidism of renal origin: Secondary | ICD-10-CM | POA: Diagnosis not present

## 2019-08-06 DIAGNOSIS — E039 Hypothyroidism, unspecified: Secondary | ICD-10-CM | POA: Diagnosis not present

## 2019-08-06 DIAGNOSIS — D689 Coagulation defect, unspecified: Secondary | ICD-10-CM | POA: Diagnosis not present

## 2019-08-06 DIAGNOSIS — E1129 Type 2 diabetes mellitus with other diabetic kidney complication: Secondary | ICD-10-CM | POA: Diagnosis not present

## 2019-08-06 DIAGNOSIS — D631 Anemia in chronic kidney disease: Secondary | ICD-10-CM | POA: Diagnosis not present

## 2019-08-06 DIAGNOSIS — Z992 Dependence on renal dialysis: Secondary | ICD-10-CM | POA: Diagnosis not present

## 2019-08-06 DIAGNOSIS — N186 End stage renal disease: Secondary | ICD-10-CM | POA: Diagnosis not present

## 2019-08-07 DIAGNOSIS — E118 Type 2 diabetes mellitus with unspecified complications: Secondary | ICD-10-CM | POA: Diagnosis not present

## 2019-08-07 DIAGNOSIS — K219 Gastro-esophageal reflux disease without esophagitis: Secondary | ICD-10-CM | POA: Diagnosis not present

## 2019-08-07 DIAGNOSIS — I10 Essential (primary) hypertension: Secondary | ICD-10-CM | POA: Diagnosis not present

## 2019-08-07 DIAGNOSIS — E1140 Type 2 diabetes mellitus with diabetic neuropathy, unspecified: Secondary | ICD-10-CM | POA: Diagnosis not present

## 2019-08-07 DIAGNOSIS — R809 Proteinuria, unspecified: Secondary | ICD-10-CM | POA: Diagnosis not present

## 2019-08-07 DIAGNOSIS — E785 Hyperlipidemia, unspecified: Secondary | ICD-10-CM | POA: Diagnosis not present

## 2019-08-08 DIAGNOSIS — N2581 Secondary hyperparathyroidism of renal origin: Secondary | ICD-10-CM | POA: Diagnosis not present

## 2019-08-08 DIAGNOSIS — E039 Hypothyroidism, unspecified: Secondary | ICD-10-CM | POA: Diagnosis not present

## 2019-08-08 DIAGNOSIS — N186 End stage renal disease: Secondary | ICD-10-CM | POA: Diagnosis not present

## 2019-08-08 DIAGNOSIS — Z992 Dependence on renal dialysis: Secondary | ICD-10-CM | POA: Diagnosis not present

## 2019-08-08 DIAGNOSIS — D631 Anemia in chronic kidney disease: Secondary | ICD-10-CM | POA: Diagnosis not present

## 2019-08-08 DIAGNOSIS — D689 Coagulation defect, unspecified: Secondary | ICD-10-CM | POA: Diagnosis not present

## 2019-08-08 DIAGNOSIS — E1129 Type 2 diabetes mellitus with other diabetic kidney complication: Secondary | ICD-10-CM | POA: Diagnosis not present

## 2019-08-11 DIAGNOSIS — N2581 Secondary hyperparathyroidism of renal origin: Secondary | ICD-10-CM | POA: Diagnosis not present

## 2019-08-11 DIAGNOSIS — D689 Coagulation defect, unspecified: Secondary | ICD-10-CM | POA: Diagnosis not present

## 2019-08-11 DIAGNOSIS — N186 End stage renal disease: Secondary | ICD-10-CM | POA: Diagnosis not present

## 2019-08-11 DIAGNOSIS — E1129 Type 2 diabetes mellitus with other diabetic kidney complication: Secondary | ICD-10-CM | POA: Diagnosis not present

## 2019-08-11 DIAGNOSIS — Z992 Dependence on renal dialysis: Secondary | ICD-10-CM | POA: Diagnosis not present

## 2019-08-11 DIAGNOSIS — D631 Anemia in chronic kidney disease: Secondary | ICD-10-CM | POA: Diagnosis not present

## 2019-08-11 DIAGNOSIS — E039 Hypothyroidism, unspecified: Secondary | ICD-10-CM | POA: Diagnosis not present

## 2019-08-13 DIAGNOSIS — N186 End stage renal disease: Secondary | ICD-10-CM | POA: Diagnosis not present

## 2019-08-13 DIAGNOSIS — Z992 Dependence on renal dialysis: Secondary | ICD-10-CM | POA: Diagnosis not present

## 2019-08-13 DIAGNOSIS — N2581 Secondary hyperparathyroidism of renal origin: Secondary | ICD-10-CM | POA: Diagnosis not present

## 2019-08-13 DIAGNOSIS — D689 Coagulation defect, unspecified: Secondary | ICD-10-CM | POA: Diagnosis not present

## 2019-08-13 DIAGNOSIS — E039 Hypothyroidism, unspecified: Secondary | ICD-10-CM | POA: Diagnosis not present

## 2019-08-13 DIAGNOSIS — E1129 Type 2 diabetes mellitus with other diabetic kidney complication: Secondary | ICD-10-CM | POA: Diagnosis not present

## 2019-08-13 DIAGNOSIS — D631 Anemia in chronic kidney disease: Secondary | ICD-10-CM | POA: Diagnosis not present

## 2019-08-15 DIAGNOSIS — N186 End stage renal disease: Secondary | ICD-10-CM | POA: Diagnosis not present

## 2019-08-15 DIAGNOSIS — D631 Anemia in chronic kidney disease: Secondary | ICD-10-CM | POA: Diagnosis not present

## 2019-08-15 DIAGNOSIS — E039 Hypothyroidism, unspecified: Secondary | ICD-10-CM | POA: Diagnosis not present

## 2019-08-15 DIAGNOSIS — E1129 Type 2 diabetes mellitus with other diabetic kidney complication: Secondary | ICD-10-CM | POA: Diagnosis not present

## 2019-08-15 DIAGNOSIS — D689 Coagulation defect, unspecified: Secondary | ICD-10-CM | POA: Diagnosis not present

## 2019-08-15 DIAGNOSIS — N2581 Secondary hyperparathyroidism of renal origin: Secondary | ICD-10-CM | POA: Diagnosis not present

## 2019-08-15 DIAGNOSIS — Z992 Dependence on renal dialysis: Secondary | ICD-10-CM | POA: Diagnosis not present

## 2019-08-18 DIAGNOSIS — N186 End stage renal disease: Secondary | ICD-10-CM | POA: Diagnosis not present

## 2019-08-18 DIAGNOSIS — D631 Anemia in chronic kidney disease: Secondary | ICD-10-CM | POA: Diagnosis not present

## 2019-08-18 DIAGNOSIS — E039 Hypothyroidism, unspecified: Secondary | ICD-10-CM | POA: Diagnosis not present

## 2019-08-18 DIAGNOSIS — E1129 Type 2 diabetes mellitus with other diabetic kidney complication: Secondary | ICD-10-CM | POA: Diagnosis not present

## 2019-08-18 DIAGNOSIS — N2581 Secondary hyperparathyroidism of renal origin: Secondary | ICD-10-CM | POA: Diagnosis not present

## 2019-08-18 DIAGNOSIS — Z992 Dependence on renal dialysis: Secondary | ICD-10-CM | POA: Diagnosis not present

## 2019-08-18 DIAGNOSIS — D689 Coagulation defect, unspecified: Secondary | ICD-10-CM | POA: Diagnosis not present

## 2019-08-20 DIAGNOSIS — N2581 Secondary hyperparathyroidism of renal origin: Secondary | ICD-10-CM | POA: Diagnosis not present

## 2019-08-20 DIAGNOSIS — N186 End stage renal disease: Secondary | ICD-10-CM | POA: Diagnosis not present

## 2019-08-20 DIAGNOSIS — D631 Anemia in chronic kidney disease: Secondary | ICD-10-CM | POA: Diagnosis not present

## 2019-08-20 DIAGNOSIS — E039 Hypothyroidism, unspecified: Secondary | ICD-10-CM | POA: Diagnosis not present

## 2019-08-20 DIAGNOSIS — Z992 Dependence on renal dialysis: Secondary | ICD-10-CM | POA: Diagnosis not present

## 2019-08-20 DIAGNOSIS — E1129 Type 2 diabetes mellitus with other diabetic kidney complication: Secondary | ICD-10-CM | POA: Diagnosis not present

## 2019-08-20 DIAGNOSIS — D689 Coagulation defect, unspecified: Secondary | ICD-10-CM | POA: Diagnosis not present

## 2019-08-21 DIAGNOSIS — K219 Gastro-esophageal reflux disease without esophagitis: Secondary | ICD-10-CM | POA: Diagnosis not present

## 2019-08-21 DIAGNOSIS — R809 Proteinuria, unspecified: Secondary | ICD-10-CM | POA: Diagnosis not present

## 2019-08-21 DIAGNOSIS — N186 End stage renal disease: Secondary | ICD-10-CM | POA: Diagnosis not present

## 2019-08-22 DIAGNOSIS — Z992 Dependence on renal dialysis: Secondary | ICD-10-CM | POA: Diagnosis not present

## 2019-08-22 DIAGNOSIS — D689 Coagulation defect, unspecified: Secondary | ICD-10-CM | POA: Diagnosis not present

## 2019-08-22 DIAGNOSIS — E1122 Type 2 diabetes mellitus with diabetic chronic kidney disease: Secondary | ICD-10-CM | POA: Diagnosis not present

## 2019-08-22 DIAGNOSIS — E1129 Type 2 diabetes mellitus with other diabetic kidney complication: Secondary | ICD-10-CM | POA: Diagnosis not present

## 2019-08-22 DIAGNOSIS — E039 Hypothyroidism, unspecified: Secondary | ICD-10-CM | POA: Diagnosis not present

## 2019-08-22 DIAGNOSIS — N186 End stage renal disease: Secondary | ICD-10-CM | POA: Diagnosis not present

## 2019-08-22 DIAGNOSIS — D631 Anemia in chronic kidney disease: Secondary | ICD-10-CM | POA: Diagnosis not present

## 2019-08-22 DIAGNOSIS — N2581 Secondary hyperparathyroidism of renal origin: Secondary | ICD-10-CM | POA: Diagnosis not present

## 2019-08-25 DIAGNOSIS — N186 End stage renal disease: Secondary | ICD-10-CM | POA: Diagnosis not present

## 2019-08-25 DIAGNOSIS — D689 Coagulation defect, unspecified: Secondary | ICD-10-CM | POA: Diagnosis not present

## 2019-08-25 DIAGNOSIS — D631 Anemia in chronic kidney disease: Secondary | ICD-10-CM | POA: Diagnosis not present

## 2019-08-25 DIAGNOSIS — N2581 Secondary hyperparathyroidism of renal origin: Secondary | ICD-10-CM | POA: Diagnosis not present

## 2019-08-25 DIAGNOSIS — Z992 Dependence on renal dialysis: Secondary | ICD-10-CM | POA: Diagnosis not present

## 2019-08-25 DIAGNOSIS — D509 Iron deficiency anemia, unspecified: Secondary | ICD-10-CM | POA: Diagnosis not present

## 2019-08-27 DIAGNOSIS — D689 Coagulation defect, unspecified: Secondary | ICD-10-CM | POA: Diagnosis not present

## 2019-08-27 DIAGNOSIS — D631 Anemia in chronic kidney disease: Secondary | ICD-10-CM | POA: Diagnosis not present

## 2019-08-27 DIAGNOSIS — D509 Iron deficiency anemia, unspecified: Secondary | ICD-10-CM | POA: Diagnosis not present

## 2019-08-27 DIAGNOSIS — N186 End stage renal disease: Secondary | ICD-10-CM | POA: Diagnosis not present

## 2019-08-27 DIAGNOSIS — Z992 Dependence on renal dialysis: Secondary | ICD-10-CM | POA: Diagnosis not present

## 2019-08-27 DIAGNOSIS — N2581 Secondary hyperparathyroidism of renal origin: Secondary | ICD-10-CM | POA: Diagnosis not present

## 2019-08-29 DIAGNOSIS — D631 Anemia in chronic kidney disease: Secondary | ICD-10-CM | POA: Diagnosis not present

## 2019-08-29 DIAGNOSIS — N186 End stage renal disease: Secondary | ICD-10-CM | POA: Diagnosis not present

## 2019-08-29 DIAGNOSIS — D689 Coagulation defect, unspecified: Secondary | ICD-10-CM | POA: Diagnosis not present

## 2019-08-29 DIAGNOSIS — N2581 Secondary hyperparathyroidism of renal origin: Secondary | ICD-10-CM | POA: Diagnosis not present

## 2019-08-29 DIAGNOSIS — D509 Iron deficiency anemia, unspecified: Secondary | ICD-10-CM | POA: Diagnosis not present

## 2019-08-29 DIAGNOSIS — Z992 Dependence on renal dialysis: Secondary | ICD-10-CM | POA: Diagnosis not present

## 2019-09-01 DIAGNOSIS — N2581 Secondary hyperparathyroidism of renal origin: Secondary | ICD-10-CM | POA: Diagnosis not present

## 2019-09-01 DIAGNOSIS — Z992 Dependence on renal dialysis: Secondary | ICD-10-CM | POA: Diagnosis not present

## 2019-09-01 DIAGNOSIS — D689 Coagulation defect, unspecified: Secondary | ICD-10-CM | POA: Diagnosis not present

## 2019-09-01 DIAGNOSIS — N186 End stage renal disease: Secondary | ICD-10-CM | POA: Diagnosis not present

## 2019-09-01 DIAGNOSIS — D509 Iron deficiency anemia, unspecified: Secondary | ICD-10-CM | POA: Diagnosis not present

## 2019-09-01 DIAGNOSIS — D631 Anemia in chronic kidney disease: Secondary | ICD-10-CM | POA: Diagnosis not present

## 2019-09-03 DIAGNOSIS — Z992 Dependence on renal dialysis: Secondary | ICD-10-CM | POA: Diagnosis not present

## 2019-09-03 DIAGNOSIS — N2581 Secondary hyperparathyroidism of renal origin: Secondary | ICD-10-CM | POA: Diagnosis not present

## 2019-09-03 DIAGNOSIS — D509 Iron deficiency anemia, unspecified: Secondary | ICD-10-CM | POA: Diagnosis not present

## 2019-09-03 DIAGNOSIS — N186 End stage renal disease: Secondary | ICD-10-CM | POA: Diagnosis not present

## 2019-09-03 DIAGNOSIS — D631 Anemia in chronic kidney disease: Secondary | ICD-10-CM | POA: Diagnosis not present

## 2019-09-03 DIAGNOSIS — D689 Coagulation defect, unspecified: Secondary | ICD-10-CM | POA: Diagnosis not present

## 2019-09-04 ENCOUNTER — Ambulatory Visit: Payer: Medicare Other | Admitting: Sports Medicine

## 2019-09-05 DIAGNOSIS — D689 Coagulation defect, unspecified: Secondary | ICD-10-CM | POA: Diagnosis not present

## 2019-09-05 DIAGNOSIS — D509 Iron deficiency anemia, unspecified: Secondary | ICD-10-CM | POA: Diagnosis not present

## 2019-09-05 DIAGNOSIS — N2581 Secondary hyperparathyroidism of renal origin: Secondary | ICD-10-CM | POA: Diagnosis not present

## 2019-09-05 DIAGNOSIS — Z992 Dependence on renal dialysis: Secondary | ICD-10-CM | POA: Diagnosis not present

## 2019-09-05 DIAGNOSIS — N186 End stage renal disease: Secondary | ICD-10-CM | POA: Diagnosis not present

## 2019-09-05 DIAGNOSIS — D631 Anemia in chronic kidney disease: Secondary | ICD-10-CM | POA: Diagnosis not present

## 2019-09-08 DIAGNOSIS — D509 Iron deficiency anemia, unspecified: Secondary | ICD-10-CM | POA: Diagnosis not present

## 2019-09-08 DIAGNOSIS — N186 End stage renal disease: Secondary | ICD-10-CM | POA: Diagnosis not present

## 2019-09-08 DIAGNOSIS — D689 Coagulation defect, unspecified: Secondary | ICD-10-CM | POA: Diagnosis not present

## 2019-09-08 DIAGNOSIS — Z992 Dependence on renal dialysis: Secondary | ICD-10-CM | POA: Diagnosis not present

## 2019-09-08 DIAGNOSIS — N2581 Secondary hyperparathyroidism of renal origin: Secondary | ICD-10-CM | POA: Diagnosis not present

## 2019-09-08 DIAGNOSIS — D631 Anemia in chronic kidney disease: Secondary | ICD-10-CM | POA: Diagnosis not present

## 2019-09-10 DIAGNOSIS — D631 Anemia in chronic kidney disease: Secondary | ICD-10-CM | POA: Diagnosis not present

## 2019-09-10 DIAGNOSIS — D509 Iron deficiency anemia, unspecified: Secondary | ICD-10-CM | POA: Diagnosis not present

## 2019-09-10 DIAGNOSIS — N2581 Secondary hyperparathyroidism of renal origin: Secondary | ICD-10-CM | POA: Diagnosis not present

## 2019-09-10 DIAGNOSIS — D689 Coagulation defect, unspecified: Secondary | ICD-10-CM | POA: Diagnosis not present

## 2019-09-10 DIAGNOSIS — N186 End stage renal disease: Secondary | ICD-10-CM | POA: Diagnosis not present

## 2019-09-10 DIAGNOSIS — Z992 Dependence on renal dialysis: Secondary | ICD-10-CM | POA: Diagnosis not present

## 2019-09-12 DIAGNOSIS — N186 End stage renal disease: Secondary | ICD-10-CM | POA: Diagnosis not present

## 2019-09-12 DIAGNOSIS — D509 Iron deficiency anemia, unspecified: Secondary | ICD-10-CM | POA: Diagnosis not present

## 2019-09-12 DIAGNOSIS — N2581 Secondary hyperparathyroidism of renal origin: Secondary | ICD-10-CM | POA: Diagnosis not present

## 2019-09-12 DIAGNOSIS — D631 Anemia in chronic kidney disease: Secondary | ICD-10-CM | POA: Diagnosis not present

## 2019-09-12 DIAGNOSIS — D689 Coagulation defect, unspecified: Secondary | ICD-10-CM | POA: Diagnosis not present

## 2019-09-12 DIAGNOSIS — Z992 Dependence on renal dialysis: Secondary | ICD-10-CM | POA: Diagnosis not present

## 2019-09-15 DIAGNOSIS — N2581 Secondary hyperparathyroidism of renal origin: Secondary | ICD-10-CM | POA: Diagnosis not present

## 2019-09-15 DIAGNOSIS — D689 Coagulation defect, unspecified: Secondary | ICD-10-CM | POA: Diagnosis not present

## 2019-09-15 DIAGNOSIS — D631 Anemia in chronic kidney disease: Secondary | ICD-10-CM | POA: Diagnosis not present

## 2019-09-15 DIAGNOSIS — N186 End stage renal disease: Secondary | ICD-10-CM | POA: Diagnosis not present

## 2019-09-15 DIAGNOSIS — Z992 Dependence on renal dialysis: Secondary | ICD-10-CM | POA: Diagnosis not present

## 2019-09-15 DIAGNOSIS — D509 Iron deficiency anemia, unspecified: Secondary | ICD-10-CM | POA: Diagnosis not present

## 2019-09-17 DIAGNOSIS — D509 Iron deficiency anemia, unspecified: Secondary | ICD-10-CM | POA: Diagnosis not present

## 2019-09-17 DIAGNOSIS — N186 End stage renal disease: Secondary | ICD-10-CM | POA: Diagnosis not present

## 2019-09-17 DIAGNOSIS — Z992 Dependence on renal dialysis: Secondary | ICD-10-CM | POA: Diagnosis not present

## 2019-09-17 DIAGNOSIS — N2581 Secondary hyperparathyroidism of renal origin: Secondary | ICD-10-CM | POA: Diagnosis not present

## 2019-09-17 DIAGNOSIS — D689 Coagulation defect, unspecified: Secondary | ICD-10-CM | POA: Diagnosis not present

## 2019-09-17 DIAGNOSIS — D631 Anemia in chronic kidney disease: Secondary | ICD-10-CM | POA: Diagnosis not present

## 2019-09-18 DIAGNOSIS — N186 End stage renal disease: Secondary | ICD-10-CM | POA: Diagnosis not present

## 2019-09-18 DIAGNOSIS — K219 Gastro-esophageal reflux disease without esophagitis: Secondary | ICD-10-CM | POA: Diagnosis not present

## 2019-09-18 DIAGNOSIS — R809 Proteinuria, unspecified: Secondary | ICD-10-CM | POA: Diagnosis not present

## 2019-09-19 DIAGNOSIS — Z992 Dependence on renal dialysis: Secondary | ICD-10-CM | POA: Diagnosis not present

## 2019-09-19 DIAGNOSIS — D509 Iron deficiency anemia, unspecified: Secondary | ICD-10-CM | POA: Diagnosis not present

## 2019-09-19 DIAGNOSIS — D689 Coagulation defect, unspecified: Secondary | ICD-10-CM | POA: Diagnosis not present

## 2019-09-19 DIAGNOSIS — N186 End stage renal disease: Secondary | ICD-10-CM | POA: Diagnosis not present

## 2019-09-19 DIAGNOSIS — N2581 Secondary hyperparathyroidism of renal origin: Secondary | ICD-10-CM | POA: Diagnosis not present

## 2019-09-19 DIAGNOSIS — D631 Anemia in chronic kidney disease: Secondary | ICD-10-CM | POA: Diagnosis not present

## 2019-09-22 DIAGNOSIS — D631 Anemia in chronic kidney disease: Secondary | ICD-10-CM | POA: Diagnosis not present

## 2019-09-22 DIAGNOSIS — Z992 Dependence on renal dialysis: Secondary | ICD-10-CM | POA: Diagnosis not present

## 2019-09-22 DIAGNOSIS — N186 End stage renal disease: Secondary | ICD-10-CM | POA: Diagnosis not present

## 2019-09-22 DIAGNOSIS — N2581 Secondary hyperparathyroidism of renal origin: Secondary | ICD-10-CM | POA: Diagnosis not present

## 2019-09-22 DIAGNOSIS — D689 Coagulation defect, unspecified: Secondary | ICD-10-CM | POA: Diagnosis not present

## 2019-09-22 DIAGNOSIS — D509 Iron deficiency anemia, unspecified: Secondary | ICD-10-CM | POA: Diagnosis not present

## 2019-09-22 DIAGNOSIS — E1122 Type 2 diabetes mellitus with diabetic chronic kidney disease: Secondary | ICD-10-CM | POA: Diagnosis not present

## 2019-09-24 DIAGNOSIS — D509 Iron deficiency anemia, unspecified: Secondary | ICD-10-CM | POA: Diagnosis not present

## 2019-09-24 DIAGNOSIS — N186 End stage renal disease: Secondary | ICD-10-CM | POA: Diagnosis not present

## 2019-09-24 DIAGNOSIS — Z23 Encounter for immunization: Secondary | ICD-10-CM | POA: Diagnosis not present

## 2019-09-24 DIAGNOSIS — Z992 Dependence on renal dialysis: Secondary | ICD-10-CM | POA: Diagnosis not present

## 2019-09-24 DIAGNOSIS — D689 Coagulation defect, unspecified: Secondary | ICD-10-CM | POA: Diagnosis not present

## 2019-09-24 DIAGNOSIS — E1129 Type 2 diabetes mellitus with other diabetic kidney complication: Secondary | ICD-10-CM | POA: Diagnosis not present

## 2019-09-24 DIAGNOSIS — D631 Anemia in chronic kidney disease: Secondary | ICD-10-CM | POA: Diagnosis not present

## 2019-09-24 DIAGNOSIS — N2581 Secondary hyperparathyroidism of renal origin: Secondary | ICD-10-CM | POA: Diagnosis not present

## 2019-09-26 DIAGNOSIS — D631 Anemia in chronic kidney disease: Secondary | ICD-10-CM | POA: Diagnosis not present

## 2019-09-26 DIAGNOSIS — Z23 Encounter for immunization: Secondary | ICD-10-CM | POA: Diagnosis not present

## 2019-09-26 DIAGNOSIS — D689 Coagulation defect, unspecified: Secondary | ICD-10-CM | POA: Diagnosis not present

## 2019-09-26 DIAGNOSIS — N186 End stage renal disease: Secondary | ICD-10-CM | POA: Diagnosis not present

## 2019-09-26 DIAGNOSIS — N2581 Secondary hyperparathyroidism of renal origin: Secondary | ICD-10-CM | POA: Diagnosis not present

## 2019-09-26 DIAGNOSIS — Z992 Dependence on renal dialysis: Secondary | ICD-10-CM | POA: Diagnosis not present

## 2019-09-26 DIAGNOSIS — E1129 Type 2 diabetes mellitus with other diabetic kidney complication: Secondary | ICD-10-CM | POA: Diagnosis not present

## 2019-09-26 DIAGNOSIS — D509 Iron deficiency anemia, unspecified: Secondary | ICD-10-CM | POA: Diagnosis not present

## 2019-09-29 DIAGNOSIS — E1129 Type 2 diabetes mellitus with other diabetic kidney complication: Secondary | ICD-10-CM | POA: Diagnosis not present

## 2019-09-29 DIAGNOSIS — D509 Iron deficiency anemia, unspecified: Secondary | ICD-10-CM | POA: Diagnosis not present

## 2019-09-29 DIAGNOSIS — D689 Coagulation defect, unspecified: Secondary | ICD-10-CM | POA: Diagnosis not present

## 2019-09-29 DIAGNOSIS — Z23 Encounter for immunization: Secondary | ICD-10-CM | POA: Diagnosis not present

## 2019-09-29 DIAGNOSIS — N186 End stage renal disease: Secondary | ICD-10-CM | POA: Diagnosis not present

## 2019-09-29 DIAGNOSIS — D631 Anemia in chronic kidney disease: Secondary | ICD-10-CM | POA: Diagnosis not present

## 2019-09-29 DIAGNOSIS — Z992 Dependence on renal dialysis: Secondary | ICD-10-CM | POA: Diagnosis not present

## 2019-09-29 DIAGNOSIS — N2581 Secondary hyperparathyroidism of renal origin: Secondary | ICD-10-CM | POA: Diagnosis not present

## 2019-10-01 DIAGNOSIS — D509 Iron deficiency anemia, unspecified: Secondary | ICD-10-CM | POA: Diagnosis not present

## 2019-10-01 DIAGNOSIS — D689 Coagulation defect, unspecified: Secondary | ICD-10-CM | POA: Diagnosis not present

## 2019-10-01 DIAGNOSIS — E1129 Type 2 diabetes mellitus with other diabetic kidney complication: Secondary | ICD-10-CM | POA: Diagnosis not present

## 2019-10-01 DIAGNOSIS — D631 Anemia in chronic kidney disease: Secondary | ICD-10-CM | POA: Diagnosis not present

## 2019-10-01 DIAGNOSIS — Z992 Dependence on renal dialysis: Secondary | ICD-10-CM | POA: Diagnosis not present

## 2019-10-01 DIAGNOSIS — N186 End stage renal disease: Secondary | ICD-10-CM | POA: Diagnosis not present

## 2019-10-01 DIAGNOSIS — N2581 Secondary hyperparathyroidism of renal origin: Secondary | ICD-10-CM | POA: Diagnosis not present

## 2019-10-01 DIAGNOSIS — Z23 Encounter for immunization: Secondary | ICD-10-CM | POA: Diagnosis not present

## 2019-10-03 DIAGNOSIS — Z23 Encounter for immunization: Secondary | ICD-10-CM | POA: Diagnosis not present

## 2019-10-03 DIAGNOSIS — D509 Iron deficiency anemia, unspecified: Secondary | ICD-10-CM | POA: Diagnosis not present

## 2019-10-03 DIAGNOSIS — N2581 Secondary hyperparathyroidism of renal origin: Secondary | ICD-10-CM | POA: Diagnosis not present

## 2019-10-03 DIAGNOSIS — Z992 Dependence on renal dialysis: Secondary | ICD-10-CM | POA: Diagnosis not present

## 2019-10-03 DIAGNOSIS — N186 End stage renal disease: Secondary | ICD-10-CM | POA: Diagnosis not present

## 2019-10-03 DIAGNOSIS — D631 Anemia in chronic kidney disease: Secondary | ICD-10-CM | POA: Diagnosis not present

## 2019-10-03 DIAGNOSIS — E1129 Type 2 diabetes mellitus with other diabetic kidney complication: Secondary | ICD-10-CM | POA: Diagnosis not present

## 2019-10-03 DIAGNOSIS — D689 Coagulation defect, unspecified: Secondary | ICD-10-CM | POA: Diagnosis not present

## 2019-10-06 DIAGNOSIS — N186 End stage renal disease: Secondary | ICD-10-CM | POA: Diagnosis not present

## 2019-10-06 DIAGNOSIS — D689 Coagulation defect, unspecified: Secondary | ICD-10-CM | POA: Diagnosis not present

## 2019-10-06 DIAGNOSIS — Z992 Dependence on renal dialysis: Secondary | ICD-10-CM | POA: Diagnosis not present

## 2019-10-06 DIAGNOSIS — Z23 Encounter for immunization: Secondary | ICD-10-CM | POA: Diagnosis not present

## 2019-10-06 DIAGNOSIS — D509 Iron deficiency anemia, unspecified: Secondary | ICD-10-CM | POA: Diagnosis not present

## 2019-10-06 DIAGNOSIS — E1129 Type 2 diabetes mellitus with other diabetic kidney complication: Secondary | ICD-10-CM | POA: Diagnosis not present

## 2019-10-06 DIAGNOSIS — D631 Anemia in chronic kidney disease: Secondary | ICD-10-CM | POA: Diagnosis not present

## 2019-10-06 DIAGNOSIS — N2581 Secondary hyperparathyroidism of renal origin: Secondary | ICD-10-CM | POA: Diagnosis not present

## 2019-10-08 DIAGNOSIS — E1129 Type 2 diabetes mellitus with other diabetic kidney complication: Secondary | ICD-10-CM | POA: Diagnosis not present

## 2019-10-08 DIAGNOSIS — Z23 Encounter for immunization: Secondary | ICD-10-CM | POA: Diagnosis not present

## 2019-10-08 DIAGNOSIS — D631 Anemia in chronic kidney disease: Secondary | ICD-10-CM | POA: Diagnosis not present

## 2019-10-08 DIAGNOSIS — D509 Iron deficiency anemia, unspecified: Secondary | ICD-10-CM | POA: Diagnosis not present

## 2019-10-08 DIAGNOSIS — N2581 Secondary hyperparathyroidism of renal origin: Secondary | ICD-10-CM | POA: Diagnosis not present

## 2019-10-08 DIAGNOSIS — N186 End stage renal disease: Secondary | ICD-10-CM | POA: Diagnosis not present

## 2019-10-08 DIAGNOSIS — Z992 Dependence on renal dialysis: Secondary | ICD-10-CM | POA: Diagnosis not present

## 2019-10-08 DIAGNOSIS — D689 Coagulation defect, unspecified: Secondary | ICD-10-CM | POA: Diagnosis not present

## 2019-10-10 ENCOUNTER — Other Ambulatory Visit: Payer: Self-pay | Admitting: Cardiology

## 2019-10-10 MED ORDER — ATORVASTATIN CALCIUM 40 MG PO TABS: 40.0000 mg | ORAL_TABLET | Freq: Every evening | ORAL | 2 refills | Status: DC

## 2019-10-10 NOTE — Telephone Encounter (Signed)
Returned call to pt.  Left a message that his Atorvastatin has been sent to [facility].

## 2019-10-10 NOTE — Telephone Encounter (Signed)
New Message    *STAT* If patient is at the pharmacy, call can be transferred to refill team.   1. Which medications need to be refilled? (please list name of each medication and dose if known) atorvastatin (LIPITOR) 40 MG tablet   2. Which pharmacy/location (including street and city if local pharmacy) is medication to be sent to? [place], [place] [employer], [address]  3. Do they need a 30 day or 90 day supply? [place]

## 2019-10-13 DIAGNOSIS — Z23 Encounter for immunization: Secondary | ICD-10-CM | POA: Diagnosis not present

## 2019-10-13 DIAGNOSIS — E1129 Type 2 diabetes mellitus with other diabetic kidney complication: Secondary | ICD-10-CM | POA: Diagnosis not present

## 2019-10-13 DIAGNOSIS — N2581 Secondary hyperparathyroidism of renal origin: Secondary | ICD-10-CM | POA: Diagnosis not present

## 2019-10-13 DIAGNOSIS — N186 End stage renal disease: Secondary | ICD-10-CM | POA: Diagnosis not present

## 2019-10-13 DIAGNOSIS — D509 Iron deficiency anemia, unspecified: Secondary | ICD-10-CM | POA: Diagnosis not present

## 2019-10-13 DIAGNOSIS — D689 Coagulation defect, unspecified: Secondary | ICD-10-CM | POA: Diagnosis not present

## 2019-10-13 DIAGNOSIS — D631 Anemia in chronic kidney disease: Secondary | ICD-10-CM | POA: Diagnosis not present

## 2019-10-13 DIAGNOSIS — Z992 Dependence on renal dialysis: Secondary | ICD-10-CM | POA: Diagnosis not present

## 2019-10-15 DIAGNOSIS — D509 Iron deficiency anemia, unspecified: Secondary | ICD-10-CM | POA: Diagnosis not present

## 2019-10-15 DIAGNOSIS — Z992 Dependence on renal dialysis: Secondary | ICD-10-CM | POA: Diagnosis not present

## 2019-10-15 DIAGNOSIS — E1129 Type 2 diabetes mellitus with other diabetic kidney complication: Secondary | ICD-10-CM | POA: Diagnosis not present

## 2019-10-15 DIAGNOSIS — D631 Anemia in chronic kidney disease: Secondary | ICD-10-CM | POA: Diagnosis not present

## 2019-10-15 DIAGNOSIS — D689 Coagulation defect, unspecified: Secondary | ICD-10-CM | POA: Diagnosis not present

## 2019-10-15 DIAGNOSIS — N2581 Secondary hyperparathyroidism of renal origin: Secondary | ICD-10-CM | POA: Diagnosis not present

## 2019-10-15 DIAGNOSIS — N186 End stage renal disease: Secondary | ICD-10-CM | POA: Diagnosis not present

## 2019-10-15 DIAGNOSIS — Z23 Encounter for immunization: Secondary | ICD-10-CM | POA: Diagnosis not present

## 2019-10-16 DIAGNOSIS — Z992 Dependence on renal dialysis: Secondary | ICD-10-CM | POA: Diagnosis not present

## 2019-10-16 DIAGNOSIS — N186 End stage renal disease: Secondary | ICD-10-CM | POA: Diagnosis not present

## 2019-10-16 DIAGNOSIS — K219 Gastro-esophageal reflux disease without esophagitis: Secondary | ICD-10-CM | POA: Diagnosis not present

## 2019-10-20 DIAGNOSIS — D509 Iron deficiency anemia, unspecified: Secondary | ICD-10-CM | POA: Diagnosis not present

## 2019-10-20 DIAGNOSIS — D689 Coagulation defect, unspecified: Secondary | ICD-10-CM | POA: Diagnosis not present

## 2019-10-20 DIAGNOSIS — E1129 Type 2 diabetes mellitus with other diabetic kidney complication: Secondary | ICD-10-CM | POA: Diagnosis not present

## 2019-10-20 DIAGNOSIS — D631 Anemia in chronic kidney disease: Secondary | ICD-10-CM | POA: Diagnosis not present

## 2019-10-20 DIAGNOSIS — N186 End stage renal disease: Secondary | ICD-10-CM | POA: Diagnosis not present

## 2019-10-20 DIAGNOSIS — N2581 Secondary hyperparathyroidism of renal origin: Secondary | ICD-10-CM | POA: Diagnosis not present

## 2019-10-20 DIAGNOSIS — Z992 Dependence on renal dialysis: Secondary | ICD-10-CM | POA: Diagnosis not present

## 2019-10-20 DIAGNOSIS — Z23 Encounter for immunization: Secondary | ICD-10-CM | POA: Diagnosis not present

## 2019-10-22 DIAGNOSIS — E1129 Type 2 diabetes mellitus with other diabetic kidney complication: Secondary | ICD-10-CM | POA: Diagnosis not present

## 2019-10-22 DIAGNOSIS — N2581 Secondary hyperparathyroidism of renal origin: Secondary | ICD-10-CM | POA: Diagnosis not present

## 2019-10-22 DIAGNOSIS — D509 Iron deficiency anemia, unspecified: Secondary | ICD-10-CM | POA: Diagnosis not present

## 2019-10-22 DIAGNOSIS — Z992 Dependence on renal dialysis: Secondary | ICD-10-CM | POA: Diagnosis not present

## 2019-10-22 DIAGNOSIS — Z23 Encounter for immunization: Secondary | ICD-10-CM | POA: Diagnosis not present

## 2019-10-22 DIAGNOSIS — E1122 Type 2 diabetes mellitus with diabetic chronic kidney disease: Secondary | ICD-10-CM | POA: Diagnosis not present

## 2019-10-22 DIAGNOSIS — D631 Anemia in chronic kidney disease: Secondary | ICD-10-CM | POA: Diagnosis not present

## 2019-10-22 DIAGNOSIS — D689 Coagulation defect, unspecified: Secondary | ICD-10-CM | POA: Diagnosis not present

## 2019-10-22 DIAGNOSIS — N186 End stage renal disease: Secondary | ICD-10-CM | POA: Diagnosis not present

## 2019-10-23 ENCOUNTER — Other Ambulatory Visit: Payer: Self-pay

## 2019-10-23 ENCOUNTER — Other Ambulatory Visit: Payer: Self-pay | Admitting: Cardiology

## 2019-10-23 ENCOUNTER — Ambulatory Visit: Payer: Medicare Other | Admitting: Sports Medicine

## 2019-10-23 ENCOUNTER — Ambulatory Visit (INDEPENDENT_AMBULATORY_CARE_PROVIDER_SITE_OTHER): Payer: Medicare Other | Admitting: Podiatry

## 2019-10-23 ENCOUNTER — Encounter: Payer: Self-pay | Admitting: Podiatry

## 2019-10-23 DIAGNOSIS — L601 Onycholysis: Secondary | ICD-10-CM | POA: Diagnosis not present

## 2019-10-23 DIAGNOSIS — Z992 Dependence on renal dialysis: Secondary | ICD-10-CM

## 2019-10-23 DIAGNOSIS — B351 Tinea unguium: Secondary | ICD-10-CM

## 2019-10-23 DIAGNOSIS — E1142 Type 2 diabetes mellitus with diabetic polyneuropathy: Secondary | ICD-10-CM | POA: Diagnosis not present

## 2019-10-23 DIAGNOSIS — N186 End stage renal disease: Secondary | ICD-10-CM

## 2019-10-23 DIAGNOSIS — M79674 Pain in right toe(s): Secondary | ICD-10-CM

## 2019-10-23 DIAGNOSIS — M79675 Pain in left toe(s): Secondary | ICD-10-CM | POA: Diagnosis not present

## 2019-10-23 NOTE — Progress Notes (Signed)
Subjective: James Chan presents today at risk foot care with h/o NIDDM with ESRD on hemodialysis and painful thick toenails that are difficult to trim. Pain interferes with ambulation. Aggravating factors include wearing enclosed shoe gear. Pain is relieved with periodic professional debridement.  Cher Nakai, MD is patient's PCP. Last visit was: 06/09/2019.  Past Medical History:  Diagnosis Date  . Anemia   . Anxiety state, unspecified 09/15/2008   Qualifier: Diagnosis of  By: Bullins CMA, Ami  , patient denies  . Arterial insufficiency of lower extremity ([place]) 05/10/2016  . Arthritis    elbows  . BACK PAIN, LUMBAR, CHRONIC 09/15/2008   Qualifier: Diagnosis of  By: Bullins CMA, Ami    . CHF (congestive heart failure) ([place])   . Claudication ([place]) 10/30/2014  . Comprehensive diabetic foot examination, type 2 DM, encounter for [facility]) 09/15/2008   Qualifier: Diagnosis of  By: Bullins CMA, Ami    . DEPRESSIVE DISORDER 09/15/2008   Qualifier: Diagnosis of  By: Bullins CMA, Ami    . Diabetes mellitus without complication ([place])   . Diabetic polyneuropathy associated with diabetes mellitus due to underlying condition ([place]) 06/28/2015  . Dyslipidemia 11/24/2015  . Dyspnea    with exertion  . End stage renal disease on dialysis ([place])    M/W/F [place]  . Esophageal reflux 09/15/2008   Qualifier: Diagnosis of  By: Bullins CMA, Ami    . Glaucoma   . Gout   . Hammer toes of both feet 06/28/2015  . History of blood transfusion   . History of carotid artery stenosis 10/30/2014  . 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 Hyperlipidemia   . Hypertension   . Hypertensive heart failure ([place]) 11/28/2016  . HYPERTRIGLYCERIDEMIA 09/15/2008   Qualifier: Diagnosis of  By: Bullins CMA, Ami    . Myocardial infarction ([place]) 1997  . NSTEMI (non-ST elevated myocardial infarction) ([place]) 09/15/2008   Qualifier: Diagnosis of  By: Bullins CMA, Ami    . Onychomycosis due to dermatophyte 06/28/2015  .  PAD (peripheral artery disease) ([place]) 10/30/2014  . Sleep apnea    no CPAP  . Stroke [facility])    no residual     Patient Active Problem List   Diagnosis Date Noted  . Allergy, unspecified, initial encounter 07/05/2019  . Personal history of covid-19 06/19/2019  . Unstable angina ([place]) 05/02/2019  . Wide-complex tachycardia ([place]) 05/02/2019  . COVID-19 virus infection 05/02/2019  . COVID-19 05/02/2019  . Ventricular tachycardia ([place]) 05/02/2019  . Headache, unspecified 04/22/2019  . Disorder of the skin and subcutaneous tissue, unspecified 10/14/2018  . CKD (chronic kidney disease) stage V requiring chronic dialysis ([place]) 10/07/2018  . Atherosclerotic heart disease of native coronary artery without angina pectoris 06/28/2018  . Unspecified protein-calorie malnutrition ([place]) 06/25/2018  . Encounter for removal of sutures 06/18/2018  . Hypokalemia 05/27/2018  . Secondary hyperparathyroidism of renal origin ([place]) 05/21/2018  . Fever, unspecified 05/18/2018  . Anemia in chronic kidney disease 05/15/2018  . Coagulation defect, unspecified ([place]) 05/15/2018  . Diarrhea, unspecified 05/15/2018  . Encounter for adjustment and management of vascular access device 05/15/2018  . Hematuria, unspecified 05/15/2018  . Hyperlipidemia, unspecified 05/15/2018  . Hypertensive chronic kidney disease with stage 5 chronic kidney disease or end stage renal disease ([place]) 05/15/2018  . Hypoglycemia, unspecified 05/15/2018  . Hypothyroidism, unspecified 05/15/2018  . Other heart failure ([place]) 05/15/2018  . Pain, unspecified 05/15/2018  . Pruritus, unspecified 05/15/2018  . Shortness of breath 05/15/2018  . Type  2 diabetes mellitus with unspecified diabetic retinopathy without macular edema ([place]) 05/15/2018  . End stage renal disease ([place]) 05/15/2018  . Type 2 diabetes mellitus with other diabetic kidney complication ([place]) 41/66/0630  . Gastro-esophageal reflux disease without esophagitis 05/15/2018  .  Peripheral vascular disease ([place]) 05/15/2018  . Unspecified atrial fibrillation ([place]) 05/15/2018  . Mitral regurgitation 02/05/2018  . Bradycardia 07/02/2017  . Iron deficiency anemia 07/02/2017  . CKD (chronic kidney disease) 04/11/2017  . PAF (paroxysmal atrial fibrillation) ([place]) 02/27/2017  . Chronic combined systolic (congestive) and diastolic (congestive) heart failure ([place]) 11/28/2016  . Hypertensive heart disease 11/28/2016  . On amiodarone therapy 11/28/2016  . Other long term (current) drug therapy 11/28/2016  . Erectile dysfunction 11/28/2016  . Arterial insufficiency of lower extremity ([place]) 05/10/2016  . Coronary artery disease involving native coronary artery of native heart without angina pectoris 11/24/2015  . Dyslipidemia 11/24/2015  . Diabetic polyneuropathy associated with diabetes mellitus due to underlying condition ([place]) 06/28/2015  . Hammer toes of both feet 06/28/2015  . Onychomycosis due to dermatophyte 06/28/2015  . Type 2 diabetes, controlled, with peripheral circulatory disorder ([place]) 06/28/2015  . Claudication ([place]) 10/30/2014  . History of carotid artery stenosis 10/30/2014  . 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 PAD (peripheral artery disease) ([place]) 10/30/2014  . Comprehensive diabetic foot examination, type 2 DM, encounter for ([place]) 09/15/2008  . HYPERTRIGLYCERIDEMIA 09/15/2008  . ANXIETY STATE, UNSPECIFIED 09/15/2008  . DEPRESSIVE DISORDER 09/15/2008  . NSTEMI (non-ST elevated myocardial infarction) ([place]) 09/15/2008  . ESOPHAGEAL REFLUX 09/15/2008  . BACK PAIN, LUMBAR, CHRONIC 09/15/2008    Current Outpatient Medications on File Prior to Visit  Medication Sig Dispense Refill  . ACCU-CHEK AVIVA PLUS test strip daily.    . Accu-Chek Softclix Lancets lancets USE AS DIRECTED DAILY AS NEEDED    . allopurinol (ZYLOPRIM) 100 MG tablet Take 100 mg by mouth at bedtime.     Marland Kitchen amiodarone (PACERONE) 200 MG tablet Take 1 tablet (200  mg total) by mouth daily. 180 tablet 1  . aspirin 81 MG chewable tablet Chew 1 tablet (81 mg total) by mouth daily. 90 tablet 3  . atorvastatin (LIPITOR) 40 MG tablet Take 1 tablet (40 mg total) by mouth every evening. 90 tablet 2  . cloNIDine (CATAPRES) 0.1 MG tablet Take 1 tablet (0.1 mg total) by mouth 2 (two) times daily as needed (if systolic blood pressure above 160 mmHg). Take 0.1mg  (one tablet) on non-hemodialysis days if systolic blood pressure is >160. 60 tablet 11  . clopidogrel (PLAVIX) 75 MG tablet Take 75 mg by mouth daily.     Marland Kitchen dicyclomine (BENTYL) 10 MG capsule Take 10 mg by mouth every 6 (six) hours as needed.    . hydrALAZINE (APRESOLINE) 25 MG tablet Take 25 mg by mouth 3 (three) times daily.     . Insulin Glargine (TOUJEO SOLOSTAR [place]) Inject 30 Units into the skin at bedtime.    . Insulin Pen Needle (NOVOTWIST) 32G X 5 MM MISC     . latanoprost (XALATAN) 0.005 % ophthalmic solution Place 1 drop into both eyes at bedtime.    . lidocaine-prilocaine (EMLA) cream Apply 1 application topically See admin instructions. Apply small amount to access site 1 to 2 hours before dialysis then cover with saran wrap.    . Methoxy PEG-Epoetin Beta (MIRCERA IJ) Mircera    . metoprolol tartrate (LOPRESSOR) 25 MG tablet Take 25 mg by mouth 2 (two) times daily.     Marland Kitchen  midodrine (PROAMATINE) 5 MG tablet Take 1 tablet by mouth three times a week as directed Take 30 minutes prior to HD treatment three times a week    . nitroGLYCERIN (NITROSTAT) 0.4 MG SL tablet Place 1 tablet (0.4 mg total) under the tongue every 5 (five) minutes as needed for chest pain. 25 tablet 3  . oxyCODONE-acetaminophen (PERCOCET/ROXICET) 5-325 MG tablet Take 1 tablet by mouth every 4 (four) hours as needed.    . polyethylene glycol (MIRALAX / GLYCOLAX) packet Take 17 g by mouth daily as needed for mild constipation.    Marland Kitchen PROAIR HFA 108 (90 Base) MCG/ACT inhaler Inhale 2 puffs into the lungs every 6 (six) hours as needed for  wheezing or shortness of breath.   12  . promethazine (PHENERGAN) 25 MG tablet Take 25 mg by mouth every 6 (six) hours as needed.     No current facility-administered medications on file prior to visit.     No Known Allergies  Objective: James Chan is a pleasant 69 y.o. Caucasian male WD, WN in NAD. AAO x 3.  There were no vitals filed for this visit.  Vascular Examination: Capillary fill time to digits <3 seconds b/l lower extremities. Faintly palpable pedal pulses b/l. Pedal hair sparse. Lower extremity skin temperature gradient within normal limits. No pain with calf compression b/l. No edema noted b/l lower extremities.  Dermatological Examination: Pedal skin with normal turgor, texture and tone bilaterally. No open wounds bilaterally. No interdigital macerations bilaterally. Toenails 2-5 bilaterally and L hallux elongated, discolored, dystrophic, thickened, and crumbly with subungual debris and tenderness to dorsal palpation. There is noted onchyolysis of entire nailplate of R hallux with subungual seroma and loose nailplate. Nailplate is completely lifted from nailbed.   Musculoskeletal: Normal muscle strength 5/5 to all lower extremity muscle groups bilaterally. No pain crepitus or joint limitation noted with ROM b/l. No gross bony deformities bilaterally.  Neurological Examination: Protective sensation diminished with 10g monofilament b/l. Vibratory sensation diminished b/l. Proprioception intact bilaterally. Babinski reflex negative b/l. Clonus negative b/l.  Last A1c: Hemoglobin A1C Latest Ref Rng & Units 05/02/2019  HGBA1C 4.8 - 5.6 % 5.6  Some recent data might be hidden     Assessment: 1. Pain due to onychomycosis of toenails of both feet   2. Onycholysis of toenail   3. Diabetic polyneuropathy associated with type 2 diabetes mellitus ([place])   4. ESRD on dialysis [facility])   Plan: -Examined patient. -Toenails 2-5 bilaterally and L hallux debrided in length and girth  without iatrogenic bleeding with sterile nail nipper and dremel.  -Discussed diagnosis of with treatment options for right hallux. He is neuropathic. Loose nailplate removed in toto without complication. Digit cleansed with alcohol. Triple antibiotic ointment and band-aid applied. Discused post-procedure instructions of daily application of Neosporin cream to right great toe nailbed once daily for 2 weeks. Patient/POA related understanding of post-procedure instructions. -Call office if there is increased redness, swelling, drainage, odor or pain. -Patient to report any pedal injuries to medical professional immediately. -Patient to continue soft, supportive shoe gear daily. -Patient/POA to call should there be question/concern in the interim.  Return in about 3 months (around 01/23/2020) for 3 month toenail debridement.  Marzetta Board, DPM

## 2019-10-24 DIAGNOSIS — E1129 Type 2 diabetes mellitus with other diabetic kidney complication: Secondary | ICD-10-CM | POA: Diagnosis not present

## 2019-10-24 DIAGNOSIS — D689 Coagulation defect, unspecified: Secondary | ICD-10-CM | POA: Diagnosis not present

## 2019-10-24 DIAGNOSIS — N2581 Secondary hyperparathyroidism of renal origin: Secondary | ICD-10-CM | POA: Diagnosis not present

## 2019-10-24 DIAGNOSIS — D631 Anemia in chronic kidney disease: Secondary | ICD-10-CM | POA: Diagnosis not present

## 2019-10-24 DIAGNOSIS — Z992 Dependence on renal dialysis: Secondary | ICD-10-CM | POA: Diagnosis not present

## 2019-10-24 DIAGNOSIS — D509 Iron deficiency anemia, unspecified: Secondary | ICD-10-CM | POA: Diagnosis not present

## 2019-10-24 DIAGNOSIS — N186 End stage renal disease: Secondary | ICD-10-CM | POA: Diagnosis not present

## 2019-10-27 DIAGNOSIS — N186 End stage renal disease: Secondary | ICD-10-CM | POA: Diagnosis not present

## 2019-10-27 DIAGNOSIS — E1129 Type 2 diabetes mellitus with other diabetic kidney complication: Secondary | ICD-10-CM | POA: Diagnosis not present

## 2019-10-27 DIAGNOSIS — Z992 Dependence on renal dialysis: Secondary | ICD-10-CM | POA: Diagnosis not present

## 2019-10-27 DIAGNOSIS — D689 Coagulation defect, unspecified: Secondary | ICD-10-CM | POA: Diagnosis not present

## 2019-10-27 DIAGNOSIS — N2581 Secondary hyperparathyroidism of renal origin: Secondary | ICD-10-CM | POA: Diagnosis not present

## 2019-10-27 DIAGNOSIS — D509 Iron deficiency anemia, unspecified: Secondary | ICD-10-CM | POA: Diagnosis not present

## 2019-10-27 DIAGNOSIS — D631 Anemia in chronic kidney disease: Secondary | ICD-10-CM | POA: Diagnosis not present

## 2019-10-29 DIAGNOSIS — D509 Iron deficiency anemia, unspecified: Secondary | ICD-10-CM | POA: Diagnosis not present

## 2019-10-29 DIAGNOSIS — D631 Anemia in chronic kidney disease: Secondary | ICD-10-CM | POA: Diagnosis not present

## 2019-10-29 DIAGNOSIS — E1129 Type 2 diabetes mellitus with other diabetic kidney complication: Secondary | ICD-10-CM | POA: Diagnosis not present

## 2019-10-29 DIAGNOSIS — N186 End stage renal disease: Secondary | ICD-10-CM | POA: Diagnosis not present

## 2019-10-29 DIAGNOSIS — N2581 Secondary hyperparathyroidism of renal origin: Secondary | ICD-10-CM | POA: Diagnosis not present

## 2019-10-29 DIAGNOSIS — D689 Coagulation defect, unspecified: Secondary | ICD-10-CM | POA: Diagnosis not present

## 2019-10-29 DIAGNOSIS — Z992 Dependence on renal dialysis: Secondary | ICD-10-CM | POA: Diagnosis not present

## 2019-10-31 DIAGNOSIS — D689 Coagulation defect, unspecified: Secondary | ICD-10-CM | POA: Diagnosis not present

## 2019-10-31 DIAGNOSIS — N2581 Secondary hyperparathyroidism of renal origin: Secondary | ICD-10-CM | POA: Diagnosis not present

## 2019-10-31 DIAGNOSIS — D631 Anemia in chronic kidney disease: Secondary | ICD-10-CM | POA: Diagnosis not present

## 2019-10-31 DIAGNOSIS — N186 End stage renal disease: Secondary | ICD-10-CM | POA: Diagnosis not present

## 2019-10-31 DIAGNOSIS — Z992 Dependence on renal dialysis: Secondary | ICD-10-CM | POA: Diagnosis not present

## 2019-10-31 DIAGNOSIS — E1129 Type 2 diabetes mellitus with other diabetic kidney complication: Secondary | ICD-10-CM | POA: Diagnosis not present

## 2019-10-31 DIAGNOSIS — D509 Iron deficiency anemia, unspecified: Secondary | ICD-10-CM | POA: Diagnosis not present

## 2019-11-03 DIAGNOSIS — E1129 Type 2 diabetes mellitus with other diabetic kidney complication: Secondary | ICD-10-CM | POA: Diagnosis not present

## 2019-11-03 DIAGNOSIS — N2581 Secondary hyperparathyroidism of renal origin: Secondary | ICD-10-CM | POA: Diagnosis not present

## 2019-11-03 DIAGNOSIS — D689 Coagulation defect, unspecified: Secondary | ICD-10-CM | POA: Diagnosis not present

## 2019-11-03 DIAGNOSIS — D509 Iron deficiency anemia, unspecified: Secondary | ICD-10-CM | POA: Diagnosis not present

## 2019-11-03 DIAGNOSIS — D631 Anemia in chronic kidney disease: Secondary | ICD-10-CM | POA: Diagnosis not present

## 2019-11-03 DIAGNOSIS — N186 End stage renal disease: Secondary | ICD-10-CM | POA: Diagnosis not present

## 2019-11-03 DIAGNOSIS — Z992 Dependence on renal dialysis: Secondary | ICD-10-CM | POA: Diagnosis not present

## 2019-11-05 DIAGNOSIS — N186 End stage renal disease: Secondary | ICD-10-CM | POA: Diagnosis not present

## 2019-11-05 DIAGNOSIS — Z992 Dependence on renal dialysis: Secondary | ICD-10-CM | POA: Diagnosis not present

## 2019-11-05 DIAGNOSIS — D509 Iron deficiency anemia, unspecified: Secondary | ICD-10-CM | POA: Diagnosis not present

## 2019-11-05 DIAGNOSIS — N2581 Secondary hyperparathyroidism of renal origin: Secondary | ICD-10-CM | POA: Diagnosis not present

## 2019-11-05 DIAGNOSIS — D631 Anemia in chronic kidney disease: Secondary | ICD-10-CM | POA: Diagnosis not present

## 2019-11-05 DIAGNOSIS — E1129 Type 2 diabetes mellitus with other diabetic kidney complication: Secondary | ICD-10-CM | POA: Diagnosis not present

## 2019-11-05 DIAGNOSIS — D689 Coagulation defect, unspecified: Secondary | ICD-10-CM | POA: Diagnosis not present

## 2019-11-07 DIAGNOSIS — D509 Iron deficiency anemia, unspecified: Secondary | ICD-10-CM | POA: Diagnosis not present

## 2019-11-07 DIAGNOSIS — E1129 Type 2 diabetes mellitus with other diabetic kidney complication: Secondary | ICD-10-CM | POA: Diagnosis not present

## 2019-11-07 DIAGNOSIS — Z992 Dependence on renal dialysis: Secondary | ICD-10-CM | POA: Diagnosis not present

## 2019-11-07 DIAGNOSIS — N2581 Secondary hyperparathyroidism of renal origin: Secondary | ICD-10-CM | POA: Diagnosis not present

## 2019-11-07 DIAGNOSIS — D631 Anemia in chronic kidney disease: Secondary | ICD-10-CM | POA: Diagnosis not present

## 2019-11-07 DIAGNOSIS — N186 End stage renal disease: Secondary | ICD-10-CM | POA: Diagnosis not present

## 2019-11-07 DIAGNOSIS — D689 Coagulation defect, unspecified: Secondary | ICD-10-CM | POA: Diagnosis not present

## 2019-11-08 ENCOUNTER — Other Ambulatory Visit: Payer: Self-pay | Admitting: Cardiology

## 2019-11-10 DIAGNOSIS — E1129 Type 2 diabetes mellitus with other diabetic kidney complication: Secondary | ICD-10-CM | POA: Diagnosis not present

## 2019-11-10 DIAGNOSIS — N2581 Secondary hyperparathyroidism of renal origin: Secondary | ICD-10-CM | POA: Diagnosis not present

## 2019-11-10 DIAGNOSIS — D689 Coagulation defect, unspecified: Secondary | ICD-10-CM | POA: Diagnosis not present

## 2019-11-10 DIAGNOSIS — D631 Anemia in chronic kidney disease: Secondary | ICD-10-CM | POA: Diagnosis not present

## 2019-11-10 DIAGNOSIS — Z992 Dependence on renal dialysis: Secondary | ICD-10-CM | POA: Diagnosis not present

## 2019-11-10 DIAGNOSIS — D509 Iron deficiency anemia, unspecified: Secondary | ICD-10-CM | POA: Diagnosis not present

## 2019-11-10 DIAGNOSIS — N186 End stage renal disease: Secondary | ICD-10-CM | POA: Diagnosis not present

## 2019-11-12 DIAGNOSIS — N186 End stage renal disease: Secondary | ICD-10-CM | POA: Diagnosis not present

## 2019-11-12 DIAGNOSIS — E1129 Type 2 diabetes mellitus with other diabetic kidney complication: Secondary | ICD-10-CM | POA: Diagnosis not present

## 2019-11-12 DIAGNOSIS — D631 Anemia in chronic kidney disease: Secondary | ICD-10-CM | POA: Diagnosis not present

## 2019-11-12 DIAGNOSIS — D509 Iron deficiency anemia, unspecified: Secondary | ICD-10-CM | POA: Diagnosis not present

## 2019-11-12 DIAGNOSIS — Z992 Dependence on renal dialysis: Secondary | ICD-10-CM | POA: Diagnosis not present

## 2019-11-12 DIAGNOSIS — D689 Coagulation defect, unspecified: Secondary | ICD-10-CM | POA: Diagnosis not present

## 2019-11-12 DIAGNOSIS — N2581 Secondary hyperparathyroidism of renal origin: Secondary | ICD-10-CM | POA: Diagnosis not present

## 2019-11-13 DIAGNOSIS — N186 End stage renal disease: Secondary | ICD-10-CM | POA: Diagnosis not present

## 2019-11-13 DIAGNOSIS — Z992 Dependence on renal dialysis: Secondary | ICD-10-CM | POA: Diagnosis not present

## 2019-11-14 DIAGNOSIS — D509 Iron deficiency anemia, unspecified: Secondary | ICD-10-CM | POA: Diagnosis not present

## 2019-11-14 DIAGNOSIS — D631 Anemia in chronic kidney disease: Secondary | ICD-10-CM | POA: Diagnosis not present

## 2019-11-14 DIAGNOSIS — N2581 Secondary hyperparathyroidism of renal origin: Secondary | ICD-10-CM | POA: Diagnosis not present

## 2019-11-14 DIAGNOSIS — E1129 Type 2 diabetes mellitus with other diabetic kidney complication: Secondary | ICD-10-CM | POA: Diagnosis not present

## 2019-11-14 DIAGNOSIS — Z992 Dependence on renal dialysis: Secondary | ICD-10-CM | POA: Diagnosis not present

## 2019-11-14 DIAGNOSIS — D689 Coagulation defect, unspecified: Secondary | ICD-10-CM | POA: Diagnosis not present

## 2019-11-14 DIAGNOSIS — N186 End stage renal disease: Secondary | ICD-10-CM | POA: Diagnosis not present

## 2019-11-17 DIAGNOSIS — D689 Coagulation defect, unspecified: Secondary | ICD-10-CM | POA: Diagnosis not present

## 2019-11-17 DIAGNOSIS — N186 End stage renal disease: Secondary | ICD-10-CM | POA: Diagnosis not present

## 2019-11-17 DIAGNOSIS — D631 Anemia in chronic kidney disease: Secondary | ICD-10-CM | POA: Diagnosis not present

## 2019-11-17 DIAGNOSIS — D509 Iron deficiency anemia, unspecified: Secondary | ICD-10-CM | POA: Diagnosis not present

## 2019-11-17 DIAGNOSIS — Z992 Dependence on renal dialysis: Secondary | ICD-10-CM | POA: Diagnosis not present

## 2019-11-17 DIAGNOSIS — N2581 Secondary hyperparathyroidism of renal origin: Secondary | ICD-10-CM | POA: Diagnosis not present

## 2019-11-17 DIAGNOSIS — E1129 Type 2 diabetes mellitus with other diabetic kidney complication: Secondary | ICD-10-CM | POA: Diagnosis not present

## 2019-11-19 DIAGNOSIS — D689 Coagulation defect, unspecified: Secondary | ICD-10-CM | POA: Diagnosis not present

## 2019-11-19 DIAGNOSIS — D509 Iron deficiency anemia, unspecified: Secondary | ICD-10-CM | POA: Diagnosis not present

## 2019-11-19 DIAGNOSIS — N186 End stage renal disease: Secondary | ICD-10-CM | POA: Diagnosis not present

## 2019-11-19 DIAGNOSIS — N2581 Secondary hyperparathyroidism of renal origin: Secondary | ICD-10-CM | POA: Diagnosis not present

## 2019-11-19 DIAGNOSIS — Z992 Dependence on renal dialysis: Secondary | ICD-10-CM | POA: Diagnosis not present

## 2019-11-19 DIAGNOSIS — D631 Anemia in chronic kidney disease: Secondary | ICD-10-CM | POA: Diagnosis not present

## 2019-11-19 DIAGNOSIS — E1129 Type 2 diabetes mellitus with other diabetic kidney complication: Secondary | ICD-10-CM | POA: Diagnosis not present

## 2019-11-19 NOTE — Progress Notes (Signed)
Cardiology Office Note:    Date:  11/20/2019   ID:  James Chan, DOB 1951-03-16, MRN [id number]  PCP:  Cher Nakai, MD  Cardiologist:  Shirlee More, MD    Referring MD: Cher Nakai, MD    ASSESSMENT:    1. Hypertensive heart disease with chronic combined systolic and diastolic congestive heart failure ([place])   2. Chronic combined systolic and diastolic heart failure ([place])   3. CKD (chronic kidney disease) stage V requiring chronic dialysis ([place])   4. PAF (paroxysmal atrial fibrillation) ([place])   5. On amiodarone therapy   6. Coronary artery disease involving native coronary artery of native heart without angina pectoris   7. Mixed hyperlipidemia   8. PAD (peripheral artery disease) (H[facility])    PLAN:    In order of problems listed above:  1. Stable and well managed with ultrafiltration and hemodialysis.  Last EF 40 to 45% 04/23/2019.  He is difficult to manage as he has severe hypertension the days off and hypotension during dialysis.  He takes midodrine to support him the days of dialysis I asked him to take it about 2 hours prior to the procedure. 2. Stable atrial fibrillation and ventricular tachycardia continue low-dose amiodarone 3. Stable CAD continue medical therapy including dual antiplatelet and lipid-lowering with a high intensity statin. 4. Peripheral vascular disease he will be evaluated with segmental pressures and ABI   Next appointment: 3 months   Medication Adjustments/Labs and Tests Ordered: Current medicines are reviewed at length with the patient today.  Concerns regarding medicines are outlined above.  No orders of the defined types were placed in this encounter.  No orders of the defined types were placed in this encounter.   Chief Complaint  Patient presents with  . Follow-up  . Congestive Heart Failure    History of Present Illness:    James Chan is a 69 y.o. male with a hx of CAD s/p NSTEMI 04/19/16 with PCI of ISR of RCA with 2 DES and  multiple PCI of RCA for restenosis, brief PAF on amiodarone, diastolic CHF, DLD, HTN, PVD, ESRD on HD and anemia. Echo 11/15/2017 with EF 50-55%, mild LVH, mild AR, mod to severe MR, diastolic dysfunction last seen 05/26/2019.He was admitted to the hospital 03/01/2020 with palpitation and dizziness.  He was found to be in a wide-complex tachycardia heart rate greater than 170 bpm and hypotensive.  He Resumed sinus rhythm with IV amiodarone cardiology was consulted and admitted in the hospital is acute coronary syndrome with mildly elevated troponin and proBNP level he underwent coronary angiography and then was noticed to have because of COVID-19 infection.  In retrospect he had a respiratory symptoms for 2 weeks did not become hypoxic and chest x-ray did not show pulmonary infiltrates.  He was transition from IV to oral amiodarone and was treated with dexamethasone remdesivir and Actemra   He was last seen 07/24/2019. Compliance with diet, lifestyle and medications: Yes  In general he is doing much better he achieves his dry weight on dialysis status trouble with hypotension that day and he is given midodrine.  I asked him to take it 2 hours before dialysis starts.  No angina shortness of breath palpitation or syncope but complains of leg pain and fatigue with exertion and we will set him up to do ABIs and segmental pressures and decide if he needs a more focused evaluation for his PAD.  He has had no breakthrough episodes of ventricular tachycardia or atrial fibrillation  on amiodarone.  I cannot tell if he is having thyroid test drawn on dialysis and I asked them to do TSH free T3 and T4 and fax to my office.  He tells me his CBC and lipids are at target. Past Medical History:  Diagnosis Date  . Anemia   . Anxiety state, unspecified 09/15/2008   Qualifier: Diagnosis of  By: Bullins CMA, Ami  , patient denies  . Arterial insufficiency of lower extremity ([place]) 05/10/2016  . Arthritis    elbows  . BACK PAIN,  LUMBAR, CHRONIC 09/15/2008   Qualifier: Diagnosis of  By: Bullins CMA, Ami    . CHF (congestive heart failure) ([place])   . Claudication ([place]) 10/30/2014  . Comprehensive diabetic foot examination, type 2 DM, encounter for [facility]) 09/15/2008   Qualifier: Diagnosis of  By: Bullins CMA, Ami    . DEPRESSIVE DISORDER 09/15/2008   Qualifier: Diagnosis of  By: Bullins CMA, Ami    . Diabetes mellitus without complication ([place])   . Diabetic polyneuropathy associated with diabetes mellitus due to underlying condition ([place]) 06/28/2015  . Dyslipidemia 11/24/2015  . Dyspnea    with exertion  . End stage renal disease on dialysis ([place])    M/W/F [place]  . Esophageal reflux 09/15/2008   Qualifier: Diagnosis of  By: Bullins CMA, Ami    . Glaucoma   . Gout   . Hammer toes of both feet 06/28/2015  . History of blood transfusion   . History of carotid artery stenosis 10/30/2014  . 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 Hyperlipidemia   . Hypertension   . Hypertensive heart failure ([place]) 11/28/2016  . HYPERTRIGLYCERIDEMIA 09/15/2008   Qualifier: Diagnosis of  By: Bullins CMA, Ami    . Myocardial infarction ([place]) 1997  . NSTEMI (non-ST elevated myocardial infarction) ([place]) 09/15/2008   Qualifier: Diagnosis of  By: Bullins CMA, Ami    . Onychomycosis due to dermatophyte 06/28/2015  . PAD (peripheral artery disease) ([place]) 10/30/2014  . Sleep apnea    no CPAP  . Stroke [facility])    no residual    Past Surgical History:  Procedure Laterality Date  . angioplasty of iliofemoral and iliac artery with stent placement    . AV FISTULA PLACEMENT Right 07/04/2018   Procedure: CREATION BASILIC VEIN ARTERIOVENOUS FISTULA RIGHT ARM;  Surgeon: [name], MD;  Location: [facility];  Service: Vascular;  Laterality: Right;  . BASCILIC VEIN TRANSPOSITION Right 10/10/2018   Procedure: BASILIC VEIN TRANSPOSITION SECOND STAGE Right upper arm;  Surgeon: [name], MD;  Location: [place];  Service: Vascular;   Laterality: Right;  . CARDIAC CATHETERIZATION    . CAROTID STENT Bilateral   . CATARACT EXTRACTION W/ INTRAOCULAR LENS  IMPLANT, BILATERAL    . COLONOSCOPY W/ POLYPECTOMY    . CORONARY ANGIOPLASTY     stents x 5  . INSERTION OF DIALYSIS CATHETER    . KNEE SURGERY     right/ left worked on ligaments and tendons  . LEFT HEART CATH AND CORONARY ANGIOGRAPHY N/A 05/02/2019   Procedure: LEFT HEART CATH AND CORONARY ANGIOGRAPHY;  Surgeon: Nelva Bush, MD;  Location: [facility];  Service: Cardiovascular;  Laterality: N/A;  . OTHER SURGICAL HISTORY     reports 32 stents to include being in eye    Current Medications: Current Meds  Medication Sig  . ACCU-CHEK AVIVA PLUS test strip daily.  . Accu-Chek Softclix Lancets lancets USE AS DIRECTED DAILY AS NEEDED  .  allopurinol (ZYLOPRIM) 100 MG tablet Take 100 mg by mouth at bedtime.   Marland Kitchen amiodarone (PACERONE) 200 MG tablet Take 1 tablet (200 mg total) by mouth daily.  Marland Kitchen aspirin 81 MG chewable tablet Chew 1 tablet (81 mg total) by mouth daily.  Marland Kitchen atorvastatin (LIPITOR) 40 MG tablet Take 1 tablet (40 mg total) by mouth every evening.  . clopidogrel (PLAVIX) 75 MG tablet Take 75 mg by mouth daily.   . hydrALAZINE (APRESOLINE) 25 MG tablet Take 25 mg by mouth 3 (three) times daily.   . Insulin Glargine (TOUJEO SOLOSTAR [place]) Inject 30 Units into the skin at bedtime.  . Insulin Pen Needle (NOVOTWIST) 32G X 5 MM MISC   . latanoprost (XALATAN) 0.005 % ophthalmic solution Place 1 drop into both eyes at bedtime.  . lidocaine-prilocaine (EMLA) cream Apply 1 application topically See admin instructions. Apply small amount to access site 1 to 2 hours before dialysis then cover with saran wrap.  . Methoxy PEG-Epoetin Beta (MIRCERA IJ) Mircera  . metoprolol tartrate (LOPRESSOR) 25 MG tablet Take 25 mg by mouth 2 (two) times daily.   . midodrine (PROAMATINE) 5 MG tablet Take 1 tablet by mouth three times a week as directed Take 30 minutes prior to HD  treatment three times a week  . nitroGLYCERIN (NITROSTAT) 0.4 MG SL tablet Place 1 tablet (0.4 mg total) under the tongue every 5 (five) minutes as needed for chest pain.  Marland Kitchen oxyCODONE-acetaminophen (PERCOCET/ROXICET) 5-325 MG tablet Take 1 tablet by mouth every 4 (four) hours as needed.  . polyethylene glycol (MIRALAX / GLYCOLAX) packet Take 17 g by mouth daily as needed for mild constipation.  Marland Kitchen PROAIR HFA 108 (90 Base) MCG/ACT inhaler Inhale 2 puffs into the lungs every 6 (six) hours as needed for wheezing or shortness of breath.   . promethazine (PHENERGAN) 25 MG tablet Take 25 mg by mouth every 6 (six) hours as needed.     Allergies:   Patient has no known allergies.   Social History   Socioeconomic History  . Marital status: Married    Spouse name: Not on file  . Number of children: Not on file  . Years of education: Not on file  . Highest education level: Not on file  Occupational History  . Not on file  Tobacco Use  . Smoking status: Former Smoker    Years: 36.00    Quit date: 05/10/1995    Years since quitting: 24.5  . Smokeless tobacco: Never Used  Vaping Use  . Vaping Use: Never used  Substance and Sexual Activity  . Alcohol use: No  . Drug use: No  . Sexual activity: Not on file  Other Topics Concern  . Not on file  Social History Narrative  . Not on file   Social Determinants of Health   Financial Resource Strain:   . Difficulty of Paying Living Expenses:   Food Insecurity:   . Worried About Charity fundraiser in the Last Year:   . Arboriculturist in the Last Year:   Transportation Needs:   . Film/video editor (Medical):   Marland Kitchen Lack of Transportation (Non-Medical):   Physical Activity:   . Days of Exercise per Week:   . Minutes of Exercise per Session:   Stress:   . Feeling of Stress :   Social Connections:   . Frequency of Communication with Friends and Family:   . Frequency of Social Gatherings with Friends and Family:   .  Attends Religious  Services:   . Active Member of Clubs or Organizations:   . Attends Archivist Meetings:   Marland Kitchen Marital Status:      Family History: The patient's family history includes Cancer in his father and mother; Heart disease in his father and mother. ROS:   Please see the history of present illness.    All other systems reviewed and are negative.  EKGs/Labs/Other Studies Reviewed:    The following studies were reviewed today:  EKG:  EKG ordered today and personally reviewed.  The ekg ordered today demonstrates sinus bradycardia 52 bpm first-degree AV block nonspecific conduction delay Q waves inferiorly consider old inferior MI  Recent Labs: 12/05/2018: TSH 3.740 05/02/2019: B Natriuretic Peptide 2,378.8 05/03/2019: ALT 14; Hemoglobin 8.9; Platelets 317 05/05/2019: BUN 56; Creatinine, Ser 7.47; Magnesium 2.0; Potassium 4.2; Sodium 125  Recent Lipid Panel    Component Value Date/Time   CHOL 80 05/03/2019 0111   CHOL 155 12/05/2018 1010   TRIG 56 05/03/2019 0111   HDL 21 (L) 05/03/2019 0111   HDL 40 12/05/2018 1010   CHOLHDL 3.8 05/03/2019 0111   VLDL 11 05/03/2019 0111   LDLCALC 48 05/03/2019 0111   LDLCALC 97 12/05/2018 1010    Physical Exam:    VS:  BP (!) 158/58   Pulse 52   Ht 6' (1.829 m)   Wt 191 lb 12.8 oz (87 kg)   SpO2 97%   BMI 26.01 kg/m     Wt Readings from Last 3 Encounters:  11/20/19 191 lb 12.8 oz (87 kg)  07/24/19 184 lb 6.4 oz (83.6 kg)  05/26/19 190 lb 3.2 oz (86.3 kg)     GEN: He looks markedly improved no longer looks chronically ill well nourished, well developed in no acute distress HEENT: Normal NECK: No JVD; No carotid bruits LYMPHATICS: No lymphadenopathy CARDIAC: RRR, no murmurs, rubs, gallops RESPIRATORY:  Clear to auscultation without rales, wheezing or rhonchi  ABDOMEN: Soft, non-tender, non-distended MUSCULOSKELETAL:  No edema; No deformity  SKIN: Warm and dry NEUROLOGIC:  Alert and oriented x 3 PSYCHIATRIC:  Normal affect     Signed, Shirlee More, MD  11/20/2019 8:56 AM    [place]

## 2019-11-20 ENCOUNTER — Ambulatory Visit (INDEPENDENT_AMBULATORY_CARE_PROVIDER_SITE_OTHER): Payer: Medicare Other | Admitting: Cardiology

## 2019-11-20 ENCOUNTER — Encounter: Payer: Self-pay | Admitting: Cardiology

## 2019-11-20 ENCOUNTER — Other Ambulatory Visit: Payer: Self-pay

## 2019-11-20 VITALS — BP 158/58 | HR 52 | Ht 72.0 in | Wt 191.8 lb

## 2019-11-20 DIAGNOSIS — Z79899 Other long term (current) drug therapy: Secondary | ICD-10-CM | POA: Diagnosis not present

## 2019-11-20 DIAGNOSIS — I5042 Chronic combined systolic (congestive) and diastolic (congestive) heart failure: Secondary | ICD-10-CM

## 2019-11-20 DIAGNOSIS — N186 End stage renal disease: Secondary | ICD-10-CM

## 2019-11-20 DIAGNOSIS — I110 Hypertensive heart disease with heart failure: Secondary | ICD-10-CM

## 2019-11-20 DIAGNOSIS — I739 Peripheral vascular disease, unspecified: Secondary | ICD-10-CM

## 2019-11-20 DIAGNOSIS — I480 Paroxysmal atrial fibrillation: Secondary | ICD-10-CM | POA: Diagnosis not present

## 2019-11-20 DIAGNOSIS — Z992 Dependence on renal dialysis: Secondary | ICD-10-CM

## 2019-11-20 DIAGNOSIS — I2510 Atherosclerotic heart disease of native coronary artery without angina pectoris: Secondary | ICD-10-CM

## 2019-11-20 DIAGNOSIS — E782 Mixed hyperlipidemia: Secondary | ICD-10-CM

## 2019-11-20 NOTE — Patient Instructions (Signed)
Medication Instructions:  Your physician recommends that you continue on your current medications as directed. Please refer to the Current Medication list given to you today.  Please take your midodrine two hours prior to your dialysis.  *If you need a refill on your cardiac medications before your next appointment, please call your pharmacy*   Lab Work: Please have them draw a TSH, T3, T4 and fax [place] the results at [phone] If you have labs (blood work) drawn today and your tests are completely normal, you will receive your results only by: Marland Kitchen MyChart Message (if you have MyChart) OR . A paper copy in the mail If you have any lab test that is abnormal or we need to change your treatment, we will call you to review the results.   Testing/Procedures: Your physician has requested that you have an ankle brachial index (ABI). During this test an ultrasound and blood pressure cuff are used to evaluate the arteries that supply the arms and legs with blood. Allow thirty minutes for this exam. There are no restrictions or special instructions.    Follow-Up: At [facility], you and your health needs are our priority.  As part of our continuing mission to provide you with exceptional heart care, we have created designated Provider Care Teams.  These Care Teams include your primary Cardiologist (physician) and Advanced Practice Providers (APPs -  Physician Assistants and Nurse Practitioners) who all work together to provide you with the care you need, when you need it.  We recommend signing up for the patient portal called "MyChart".  Sign up information is provided on this After Visit Summary.  MyChart is used to connect with patients for Virtual Visits (Telemedicine).  Patients are able to view lab/test results, encounter notes, upcoming appointments, etc.  Non-urgent messages can be sent to your provider as well.   To learn more about what you can do with MyChart, go to [email or web address].     Your next appointment:   4 month(s)  The format for your next appointment:   In Person  Provider:   Shirlee More, MD   Other Instructions

## 2019-11-21 DIAGNOSIS — N186 End stage renal disease: Secondary | ICD-10-CM | POA: Diagnosis not present

## 2019-11-21 DIAGNOSIS — D631 Anemia in chronic kidney disease: Secondary | ICD-10-CM | POA: Diagnosis not present

## 2019-11-21 DIAGNOSIS — E1129 Type 2 diabetes mellitus with other diabetic kidney complication: Secondary | ICD-10-CM | POA: Diagnosis not present

## 2019-11-21 DIAGNOSIS — Z992 Dependence on renal dialysis: Secondary | ICD-10-CM | POA: Diagnosis not present

## 2019-11-21 DIAGNOSIS — D509 Iron deficiency anemia, unspecified: Secondary | ICD-10-CM | POA: Diagnosis not present

## 2019-11-21 DIAGNOSIS — D689 Coagulation defect, unspecified: Secondary | ICD-10-CM | POA: Diagnosis not present

## 2019-11-21 DIAGNOSIS — N2581 Secondary hyperparathyroidism of renal origin: Secondary | ICD-10-CM | POA: Diagnosis not present

## 2019-11-22 DIAGNOSIS — N186 End stage renal disease: Secondary | ICD-10-CM | POA: Diagnosis not present

## 2019-11-22 DIAGNOSIS — E1122 Type 2 diabetes mellitus with diabetic chronic kidney disease: Secondary | ICD-10-CM | POA: Diagnosis not present

## 2019-11-22 DIAGNOSIS — Z992 Dependence on renal dialysis: Secondary | ICD-10-CM | POA: Diagnosis not present

## 2019-11-24 DIAGNOSIS — N186 End stage renal disease: Secondary | ICD-10-CM | POA: Diagnosis not present

## 2019-11-24 DIAGNOSIS — N2581 Secondary hyperparathyroidism of renal origin: Secondary | ICD-10-CM | POA: Diagnosis not present

## 2019-11-24 DIAGNOSIS — E039 Hypothyroidism, unspecified: Secondary | ICD-10-CM | POA: Diagnosis not present

## 2019-11-24 DIAGNOSIS — D631 Anemia in chronic kidney disease: Secondary | ICD-10-CM | POA: Diagnosis not present

## 2019-11-24 DIAGNOSIS — D509 Iron deficiency anemia, unspecified: Secondary | ICD-10-CM | POA: Diagnosis not present

## 2019-11-24 DIAGNOSIS — Z992 Dependence on renal dialysis: Secondary | ICD-10-CM | POA: Diagnosis not present

## 2019-11-24 DIAGNOSIS — D689 Coagulation defect, unspecified: Secondary | ICD-10-CM | POA: Diagnosis not present

## 2019-11-26 DIAGNOSIS — D631 Anemia in chronic kidney disease: Secondary | ICD-10-CM | POA: Diagnosis not present

## 2019-11-26 DIAGNOSIS — N2581 Secondary hyperparathyroidism of renal origin: Secondary | ICD-10-CM | POA: Diagnosis not present

## 2019-11-26 DIAGNOSIS — N186 End stage renal disease: Secondary | ICD-10-CM | POA: Diagnosis not present

## 2019-11-26 DIAGNOSIS — Z992 Dependence on renal dialysis: Secondary | ICD-10-CM | POA: Diagnosis not present

## 2019-11-26 DIAGNOSIS — D689 Coagulation defect, unspecified: Secondary | ICD-10-CM | POA: Diagnosis not present

## 2019-11-26 DIAGNOSIS — D509 Iron deficiency anemia, unspecified: Secondary | ICD-10-CM | POA: Diagnosis not present

## 2019-11-26 DIAGNOSIS — E039 Hypothyroidism, unspecified: Secondary | ICD-10-CM | POA: Diagnosis not present

## 2019-11-27 DIAGNOSIS — Z992 Dependence on renal dialysis: Secondary | ICD-10-CM | POA: Diagnosis not present

## 2019-11-27 DIAGNOSIS — E785 Hyperlipidemia, unspecified: Secondary | ICD-10-CM | POA: Diagnosis not present

## 2019-11-27 DIAGNOSIS — K219 Gastro-esophageal reflux disease without esophagitis: Secondary | ICD-10-CM | POA: Diagnosis not present

## 2019-11-27 DIAGNOSIS — N186 End stage renal disease: Secondary | ICD-10-CM | POA: Diagnosis not present

## 2019-11-27 DIAGNOSIS — E118 Type 2 diabetes mellitus with unspecified complications: Secondary | ICD-10-CM | POA: Diagnosis not present

## 2019-11-27 DIAGNOSIS — I10 Essential (primary) hypertension: Secondary | ICD-10-CM | POA: Diagnosis not present

## 2019-11-28 DIAGNOSIS — Z992 Dependence on renal dialysis: Secondary | ICD-10-CM | POA: Diagnosis not present

## 2019-11-28 DIAGNOSIS — N186 End stage renal disease: Secondary | ICD-10-CM | POA: Diagnosis not present

## 2019-11-28 DIAGNOSIS — D509 Iron deficiency anemia, unspecified: Secondary | ICD-10-CM | POA: Diagnosis not present

## 2019-11-28 DIAGNOSIS — D631 Anemia in chronic kidney disease: Secondary | ICD-10-CM | POA: Diagnosis not present

## 2019-11-28 DIAGNOSIS — N2581 Secondary hyperparathyroidism of renal origin: Secondary | ICD-10-CM | POA: Diagnosis not present

## 2019-11-28 DIAGNOSIS — D689 Coagulation defect, unspecified: Secondary | ICD-10-CM | POA: Diagnosis not present

## 2019-11-28 DIAGNOSIS — E039 Hypothyroidism, unspecified: Secondary | ICD-10-CM | POA: Diagnosis not present

## 2019-12-01 DIAGNOSIS — Z992 Dependence on renal dialysis: Secondary | ICD-10-CM | POA: Diagnosis not present

## 2019-12-01 DIAGNOSIS — D631 Anemia in chronic kidney disease: Secondary | ICD-10-CM | POA: Diagnosis not present

## 2019-12-01 DIAGNOSIS — D689 Coagulation defect, unspecified: Secondary | ICD-10-CM | POA: Diagnosis not present

## 2019-12-01 DIAGNOSIS — N186 End stage renal disease: Secondary | ICD-10-CM | POA: Diagnosis not present

## 2019-12-01 DIAGNOSIS — E039 Hypothyroidism, unspecified: Secondary | ICD-10-CM | POA: Diagnosis not present

## 2019-12-01 DIAGNOSIS — N2581 Secondary hyperparathyroidism of renal origin: Secondary | ICD-10-CM | POA: Diagnosis not present

## 2019-12-01 DIAGNOSIS — D509 Iron deficiency anemia, unspecified: Secondary | ICD-10-CM | POA: Diagnosis not present

## 2019-12-03 DIAGNOSIS — N186 End stage renal disease: Secondary | ICD-10-CM | POA: Diagnosis not present

## 2019-12-03 DIAGNOSIS — E039 Hypothyroidism, unspecified: Secondary | ICD-10-CM | POA: Diagnosis not present

## 2019-12-03 DIAGNOSIS — D509 Iron deficiency anemia, unspecified: Secondary | ICD-10-CM | POA: Diagnosis not present

## 2019-12-03 DIAGNOSIS — N2581 Secondary hyperparathyroidism of renal origin: Secondary | ICD-10-CM | POA: Diagnosis not present

## 2019-12-03 DIAGNOSIS — D689 Coagulation defect, unspecified: Secondary | ICD-10-CM | POA: Diagnosis not present

## 2019-12-03 DIAGNOSIS — Z992 Dependence on renal dialysis: Secondary | ICD-10-CM | POA: Diagnosis not present

## 2019-12-03 DIAGNOSIS — D631 Anemia in chronic kidney disease: Secondary | ICD-10-CM | POA: Diagnosis not present

## 2019-12-05 DIAGNOSIS — E039 Hypothyroidism, unspecified: Secondary | ICD-10-CM | POA: Diagnosis not present

## 2019-12-05 DIAGNOSIS — N2581 Secondary hyperparathyroidism of renal origin: Secondary | ICD-10-CM | POA: Diagnosis not present

## 2019-12-05 DIAGNOSIS — Z992 Dependence on renal dialysis: Secondary | ICD-10-CM | POA: Diagnosis not present

## 2019-12-05 DIAGNOSIS — N186 End stage renal disease: Secondary | ICD-10-CM | POA: Diagnosis not present

## 2019-12-05 DIAGNOSIS — D509 Iron deficiency anemia, unspecified: Secondary | ICD-10-CM | POA: Diagnosis not present

## 2019-12-05 DIAGNOSIS — D689 Coagulation defect, unspecified: Secondary | ICD-10-CM | POA: Diagnosis not present

## 2019-12-05 DIAGNOSIS — D631 Anemia in chronic kidney disease: Secondary | ICD-10-CM | POA: Diagnosis not present

## 2019-12-08 DIAGNOSIS — Z992 Dependence on renal dialysis: Secondary | ICD-10-CM | POA: Diagnosis not present

## 2019-12-08 DIAGNOSIS — N186 End stage renal disease: Secondary | ICD-10-CM | POA: Diagnosis not present

## 2019-12-08 DIAGNOSIS — N2581 Secondary hyperparathyroidism of renal origin: Secondary | ICD-10-CM | POA: Diagnosis not present

## 2019-12-08 DIAGNOSIS — E039 Hypothyroidism, unspecified: Secondary | ICD-10-CM | POA: Diagnosis not present

## 2019-12-08 DIAGNOSIS — D509 Iron deficiency anemia, unspecified: Secondary | ICD-10-CM | POA: Diagnosis not present

## 2019-12-08 DIAGNOSIS — D689 Coagulation defect, unspecified: Secondary | ICD-10-CM | POA: Diagnosis not present

## 2019-12-08 DIAGNOSIS — D631 Anemia in chronic kidney disease: Secondary | ICD-10-CM | POA: Diagnosis not present

## 2019-12-10 DIAGNOSIS — Z992 Dependence on renal dialysis: Secondary | ICD-10-CM | POA: Diagnosis not present

## 2019-12-10 DIAGNOSIS — N2581 Secondary hyperparathyroidism of renal origin: Secondary | ICD-10-CM | POA: Diagnosis not present

## 2019-12-10 DIAGNOSIS — D631 Anemia in chronic kidney disease: Secondary | ICD-10-CM | POA: Diagnosis not present

## 2019-12-10 DIAGNOSIS — D509 Iron deficiency anemia, unspecified: Secondary | ICD-10-CM | POA: Diagnosis not present

## 2019-12-10 DIAGNOSIS — D689 Coagulation defect, unspecified: Secondary | ICD-10-CM | POA: Diagnosis not present

## 2019-12-10 DIAGNOSIS — N186 End stage renal disease: Secondary | ICD-10-CM | POA: Diagnosis not present

## 2019-12-10 DIAGNOSIS — E039 Hypothyroidism, unspecified: Secondary | ICD-10-CM | POA: Diagnosis not present

## 2019-12-11 ENCOUNTER — Other Ambulatory Visit: Payer: Self-pay

## 2019-12-11 ENCOUNTER — Other Ambulatory Visit: Payer: Self-pay | Admitting: Cardiology

## 2019-12-11 ENCOUNTER — Ambulatory Visit: Payer: Medicare Other

## 2019-12-11 DIAGNOSIS — G8929 Other chronic pain: Secondary | ICD-10-CM | POA: Diagnosis not present

## 2019-12-11 DIAGNOSIS — M545 Low back pain: Secondary | ICD-10-CM | POA: Diagnosis not present

## 2019-12-11 DIAGNOSIS — K219 Gastro-esophageal reflux disease without esophagitis: Secondary | ICD-10-CM | POA: Diagnosis not present

## 2019-12-11 DIAGNOSIS — I739 Peripheral vascular disease, unspecified: Secondary | ICD-10-CM

## 2019-12-11 NOTE — Progress Notes (Signed)
ABI exam couldn't be performed due to fistula right arm/ increased pressure in left arm. Exam canceled, Plan to speak to Dr. Bettina Gavia about rescheduling appropriate exam ASAP.  Jimmy Dola Lunsford RDCS, RVT

## 2019-12-12 DIAGNOSIS — D631 Anemia in chronic kidney disease: Secondary | ICD-10-CM | POA: Diagnosis not present

## 2019-12-12 DIAGNOSIS — E039 Hypothyroidism, unspecified: Secondary | ICD-10-CM | POA: Diagnosis not present

## 2019-12-12 DIAGNOSIS — N186 End stage renal disease: Secondary | ICD-10-CM | POA: Diagnosis not present

## 2019-12-12 DIAGNOSIS — D509 Iron deficiency anemia, unspecified: Secondary | ICD-10-CM | POA: Diagnosis not present

## 2019-12-12 DIAGNOSIS — D689 Coagulation defect, unspecified: Secondary | ICD-10-CM | POA: Diagnosis not present

## 2019-12-12 DIAGNOSIS — N2581 Secondary hyperparathyroidism of renal origin: Secondary | ICD-10-CM | POA: Diagnosis not present

## 2019-12-12 DIAGNOSIS — Z992 Dependence on renal dialysis: Secondary | ICD-10-CM | POA: Diagnosis not present

## 2019-12-15 DIAGNOSIS — D689 Coagulation defect, unspecified: Secondary | ICD-10-CM | POA: Diagnosis not present

## 2019-12-15 DIAGNOSIS — Z992 Dependence on renal dialysis: Secondary | ICD-10-CM | POA: Diagnosis not present

## 2019-12-15 DIAGNOSIS — D509 Iron deficiency anemia, unspecified: Secondary | ICD-10-CM | POA: Diagnosis not present

## 2019-12-15 DIAGNOSIS — E039 Hypothyroidism, unspecified: Secondary | ICD-10-CM | POA: Diagnosis not present

## 2019-12-15 DIAGNOSIS — N2581 Secondary hyperparathyroidism of renal origin: Secondary | ICD-10-CM | POA: Diagnosis not present

## 2019-12-15 DIAGNOSIS — N186 End stage renal disease: Secondary | ICD-10-CM | POA: Diagnosis not present

## 2019-12-15 DIAGNOSIS — D631 Anemia in chronic kidney disease: Secondary | ICD-10-CM | POA: Diagnosis not present

## 2019-12-16 DIAGNOSIS — E113293 Type 2 diabetes mellitus with mild nonproliferative diabetic retinopathy without macular edema, bilateral: Secondary | ICD-10-CM | POA: Diagnosis not present

## 2019-12-16 DIAGNOSIS — H401131 Primary open-angle glaucoma, bilateral, mild stage: Secondary | ICD-10-CM | POA: Diagnosis not present

## 2019-12-16 DIAGNOSIS — Z794 Long term (current) use of insulin: Secondary | ICD-10-CM | POA: Diagnosis not present

## 2019-12-17 DIAGNOSIS — E039 Hypothyroidism, unspecified: Secondary | ICD-10-CM | POA: Diagnosis not present

## 2019-12-17 DIAGNOSIS — D631 Anemia in chronic kidney disease: Secondary | ICD-10-CM | POA: Diagnosis not present

## 2019-12-17 DIAGNOSIS — N2581 Secondary hyperparathyroidism of renal origin: Secondary | ICD-10-CM | POA: Diagnosis not present

## 2019-12-17 DIAGNOSIS — N186 End stage renal disease: Secondary | ICD-10-CM | POA: Diagnosis not present

## 2019-12-17 DIAGNOSIS — D509 Iron deficiency anemia, unspecified: Secondary | ICD-10-CM | POA: Diagnosis not present

## 2019-12-17 DIAGNOSIS — D689 Coagulation defect, unspecified: Secondary | ICD-10-CM | POA: Diagnosis not present

## 2019-12-17 DIAGNOSIS — Z992 Dependence on renal dialysis: Secondary | ICD-10-CM | POA: Diagnosis not present

## 2019-12-19 DIAGNOSIS — D631 Anemia in chronic kidney disease: Secondary | ICD-10-CM | POA: Diagnosis not present

## 2019-12-19 DIAGNOSIS — N2581 Secondary hyperparathyroidism of renal origin: Secondary | ICD-10-CM | POA: Diagnosis not present

## 2019-12-19 DIAGNOSIS — D689 Coagulation defect, unspecified: Secondary | ICD-10-CM | POA: Diagnosis not present

## 2019-12-19 DIAGNOSIS — N186 End stage renal disease: Secondary | ICD-10-CM | POA: Diagnosis not present

## 2019-12-19 DIAGNOSIS — D509 Iron deficiency anemia, unspecified: Secondary | ICD-10-CM | POA: Diagnosis not present

## 2019-12-19 DIAGNOSIS — E039 Hypothyroidism, unspecified: Secondary | ICD-10-CM | POA: Diagnosis not present

## 2019-12-19 DIAGNOSIS — Z992 Dependence on renal dialysis: Secondary | ICD-10-CM | POA: Diagnosis not present

## 2019-12-22 DIAGNOSIS — N186 End stage renal disease: Secondary | ICD-10-CM | POA: Diagnosis not present

## 2019-12-22 DIAGNOSIS — D689 Coagulation defect, unspecified: Secondary | ICD-10-CM | POA: Diagnosis not present

## 2019-12-22 DIAGNOSIS — E039 Hypothyroidism, unspecified: Secondary | ICD-10-CM | POA: Diagnosis not present

## 2019-12-22 DIAGNOSIS — R809 Proteinuria, unspecified: Secondary | ICD-10-CM | POA: Diagnosis not present

## 2019-12-22 DIAGNOSIS — N2581 Secondary hyperparathyroidism of renal origin: Secondary | ICD-10-CM | POA: Diagnosis not present

## 2019-12-22 DIAGNOSIS — L03116 Cellulitis of left lower limb: Secondary | ICD-10-CM | POA: Diagnosis not present

## 2019-12-22 DIAGNOSIS — K219 Gastro-esophageal reflux disease without esophagitis: Secondary | ICD-10-CM | POA: Diagnosis not present

## 2019-12-22 DIAGNOSIS — D509 Iron deficiency anemia, unspecified: Secondary | ICD-10-CM | POA: Diagnosis not present

## 2019-12-22 DIAGNOSIS — Z992 Dependence on renal dialysis: Secondary | ICD-10-CM | POA: Diagnosis not present

## 2019-12-22 DIAGNOSIS — D631 Anemia in chronic kidney disease: Secondary | ICD-10-CM | POA: Diagnosis not present

## 2019-12-23 DIAGNOSIS — N186 End stage renal disease: Secondary | ICD-10-CM | POA: Diagnosis not present

## 2019-12-23 DIAGNOSIS — Z992 Dependence on renal dialysis: Secondary | ICD-10-CM | POA: Diagnosis not present

## 2019-12-23 DIAGNOSIS — E1122 Type 2 diabetes mellitus with diabetic chronic kidney disease: Secondary | ICD-10-CM | POA: Diagnosis not present

## 2019-12-24 DIAGNOSIS — D509 Iron deficiency anemia, unspecified: Secondary | ICD-10-CM | POA: Diagnosis not present

## 2019-12-24 DIAGNOSIS — Z992 Dependence on renal dialysis: Secondary | ICD-10-CM | POA: Diagnosis not present

## 2019-12-24 DIAGNOSIS — D689 Coagulation defect, unspecified: Secondary | ICD-10-CM | POA: Diagnosis not present

## 2019-12-24 DIAGNOSIS — N186 End stage renal disease: Secondary | ICD-10-CM | POA: Diagnosis not present

## 2019-12-24 DIAGNOSIS — N2581 Secondary hyperparathyroidism of renal origin: Secondary | ICD-10-CM | POA: Diagnosis not present

## 2019-12-24 DIAGNOSIS — D631 Anemia in chronic kidney disease: Secondary | ICD-10-CM | POA: Diagnosis not present

## 2019-12-25 DIAGNOSIS — K219 Gastro-esophageal reflux disease without esophagitis: Secondary | ICD-10-CM | POA: Diagnosis not present

## 2019-12-25 DIAGNOSIS — G8929 Other chronic pain: Secondary | ICD-10-CM | POA: Diagnosis not present

## 2019-12-25 DIAGNOSIS — M545 Low back pain: Secondary | ICD-10-CM | POA: Diagnosis not present

## 2019-12-26 DIAGNOSIS — D689 Coagulation defect, unspecified: Secondary | ICD-10-CM | POA: Diagnosis not present

## 2019-12-26 DIAGNOSIS — D509 Iron deficiency anemia, unspecified: Secondary | ICD-10-CM | POA: Diagnosis not present

## 2019-12-26 DIAGNOSIS — N186 End stage renal disease: Secondary | ICD-10-CM | POA: Diagnosis not present

## 2019-12-26 DIAGNOSIS — N2581 Secondary hyperparathyroidism of renal origin: Secondary | ICD-10-CM | POA: Diagnosis not present

## 2019-12-26 DIAGNOSIS — Z992 Dependence on renal dialysis: Secondary | ICD-10-CM | POA: Diagnosis not present

## 2019-12-26 DIAGNOSIS — D631 Anemia in chronic kidney disease: Secondary | ICD-10-CM | POA: Diagnosis not present

## 2019-12-29 DIAGNOSIS — Z992 Dependence on renal dialysis: Secondary | ICD-10-CM | POA: Diagnosis not present

## 2019-12-29 DIAGNOSIS — D509 Iron deficiency anemia, unspecified: Secondary | ICD-10-CM | POA: Diagnosis not present

## 2019-12-29 DIAGNOSIS — N186 End stage renal disease: Secondary | ICD-10-CM | POA: Diagnosis not present

## 2019-12-29 DIAGNOSIS — N2581 Secondary hyperparathyroidism of renal origin: Secondary | ICD-10-CM | POA: Diagnosis not present

## 2019-12-29 DIAGNOSIS — D631 Anemia in chronic kidney disease: Secondary | ICD-10-CM | POA: Diagnosis not present

## 2019-12-29 DIAGNOSIS — D689 Coagulation defect, unspecified: Secondary | ICD-10-CM | POA: Diagnosis not present

## 2019-12-30 ENCOUNTER — Other Ambulatory Visit: Payer: Self-pay | Admitting: Cardiology

## 2019-12-31 DIAGNOSIS — Z992 Dependence on renal dialysis: Secondary | ICD-10-CM | POA: Diagnosis not present

## 2019-12-31 DIAGNOSIS — D689 Coagulation defect, unspecified: Secondary | ICD-10-CM | POA: Diagnosis not present

## 2019-12-31 DIAGNOSIS — D509 Iron deficiency anemia, unspecified: Secondary | ICD-10-CM | POA: Diagnosis not present

## 2019-12-31 DIAGNOSIS — D631 Anemia in chronic kidney disease: Secondary | ICD-10-CM | POA: Diagnosis not present

## 2019-12-31 DIAGNOSIS — N2581 Secondary hyperparathyroidism of renal origin: Secondary | ICD-10-CM | POA: Diagnosis not present

## 2019-12-31 DIAGNOSIS — N186 End stage renal disease: Secondary | ICD-10-CM | POA: Diagnosis not present

## 2020-01-02 DIAGNOSIS — N186 End stage renal disease: Secondary | ICD-10-CM | POA: Diagnosis not present

## 2020-01-02 DIAGNOSIS — D509 Iron deficiency anemia, unspecified: Secondary | ICD-10-CM | POA: Diagnosis not present

## 2020-01-02 DIAGNOSIS — N2581 Secondary hyperparathyroidism of renal origin: Secondary | ICD-10-CM | POA: Diagnosis not present

## 2020-01-02 DIAGNOSIS — Z992 Dependence on renal dialysis: Secondary | ICD-10-CM | POA: Diagnosis not present

## 2020-01-02 DIAGNOSIS — D689 Coagulation defect, unspecified: Secondary | ICD-10-CM | POA: Diagnosis not present

## 2020-01-02 DIAGNOSIS — D631 Anemia in chronic kidney disease: Secondary | ICD-10-CM | POA: Diagnosis not present

## 2020-01-05 DIAGNOSIS — N2581 Secondary hyperparathyroidism of renal origin: Secondary | ICD-10-CM | POA: Diagnosis not present

## 2020-01-05 DIAGNOSIS — D689 Coagulation defect, unspecified: Secondary | ICD-10-CM | POA: Diagnosis not present

## 2020-01-05 DIAGNOSIS — Z992 Dependence on renal dialysis: Secondary | ICD-10-CM | POA: Diagnosis not present

## 2020-01-05 DIAGNOSIS — N186 End stage renal disease: Secondary | ICD-10-CM | POA: Diagnosis not present

## 2020-01-05 DIAGNOSIS — D509 Iron deficiency anemia, unspecified: Secondary | ICD-10-CM | POA: Diagnosis not present

## 2020-01-05 DIAGNOSIS — D631 Anemia in chronic kidney disease: Secondary | ICD-10-CM | POA: Diagnosis not present

## 2020-01-06 ENCOUNTER — Other Ambulatory Visit: Payer: Self-pay

## 2020-01-06 ENCOUNTER — Ambulatory Visit (INDEPENDENT_AMBULATORY_CARE_PROVIDER_SITE_OTHER): Payer: Medicare Other

## 2020-01-06 DIAGNOSIS — I739 Peripheral vascular disease, unspecified: Secondary | ICD-10-CM

## 2020-01-06 NOTE — Progress Notes (Signed)
Complete lower extremity arterial duplex exam performed.  Jimmy Roe Wilner RDCS, RVT

## 2020-01-06 NOTE — Progress Notes (Signed)
Lower extremity arterial duplex exam performed..  Jimmy Woodson Macha RDCS, RVT  

## 2020-01-07 ENCOUNTER — Telehealth: Payer: Self-pay

## 2020-01-07 DIAGNOSIS — D509 Iron deficiency anemia, unspecified: Secondary | ICD-10-CM | POA: Diagnosis not present

## 2020-01-07 DIAGNOSIS — I739 Peripheral vascular disease, unspecified: Secondary | ICD-10-CM

## 2020-01-07 DIAGNOSIS — D631 Anemia in chronic kidney disease: Secondary | ICD-10-CM | POA: Diagnosis not present

## 2020-01-07 DIAGNOSIS — N186 End stage renal disease: Secondary | ICD-10-CM | POA: Diagnosis not present

## 2020-01-07 DIAGNOSIS — N2581 Secondary hyperparathyroidism of renal origin: Secondary | ICD-10-CM | POA: Diagnosis not present

## 2020-01-07 DIAGNOSIS — Z992 Dependence on renal dialysis: Secondary | ICD-10-CM | POA: Diagnosis not present

## 2020-01-07 DIAGNOSIS — D689 Coagulation defect, unspecified: Secondary | ICD-10-CM | POA: Diagnosis not present

## 2020-01-07 NOTE — Telephone Encounter (Signed)
-----   Message from Richardo Priest, MD sent at 01/07/2020  8:46 AM EDT ----- His vascular duplex is abnormal he has multiple areas of blockage in both lower extremities I think you do well to see the vascular surgeon and Dr. Trula Slade has previously taken care of him for dialysis access.  I will send a copy of this report to him

## 2020-01-07 NOTE — Telephone Encounter (Signed)
Left message on patients voicemail to please return our call.   

## 2020-01-07 NOTE — Telephone Encounter (Signed)
Spoke with patient regarding results and recommendation.  Patient verbalizes understanding and is agreeable to plan of care. Advised patient to call back with any issues or concerns.  

## 2020-01-08 DIAGNOSIS — G8929 Other chronic pain: Secondary | ICD-10-CM | POA: Diagnosis not present

## 2020-01-08 DIAGNOSIS — K219 Gastro-esophageal reflux disease without esophagitis: Secondary | ICD-10-CM | POA: Diagnosis not present

## 2020-01-08 DIAGNOSIS — M545 Low back pain: Secondary | ICD-10-CM | POA: Diagnosis not present

## 2020-01-09 DIAGNOSIS — Z992 Dependence on renal dialysis: Secondary | ICD-10-CM | POA: Diagnosis not present

## 2020-01-09 DIAGNOSIS — D509 Iron deficiency anemia, unspecified: Secondary | ICD-10-CM | POA: Diagnosis not present

## 2020-01-09 DIAGNOSIS — N2581 Secondary hyperparathyroidism of renal origin: Secondary | ICD-10-CM | POA: Diagnosis not present

## 2020-01-09 DIAGNOSIS — D689 Coagulation defect, unspecified: Secondary | ICD-10-CM | POA: Diagnosis not present

## 2020-01-09 DIAGNOSIS — D631 Anemia in chronic kidney disease: Secondary | ICD-10-CM | POA: Diagnosis not present

## 2020-01-09 DIAGNOSIS — N186 End stage renal disease: Secondary | ICD-10-CM | POA: Diagnosis not present

## 2020-01-12 DIAGNOSIS — D509 Iron deficiency anemia, unspecified: Secondary | ICD-10-CM | POA: Diagnosis not present

## 2020-01-12 DIAGNOSIS — D631 Anemia in chronic kidney disease: Secondary | ICD-10-CM | POA: Diagnosis not present

## 2020-01-12 DIAGNOSIS — N2581 Secondary hyperparathyroidism of renal origin: Secondary | ICD-10-CM | POA: Diagnosis not present

## 2020-01-12 DIAGNOSIS — N186 End stage renal disease: Secondary | ICD-10-CM | POA: Diagnosis not present

## 2020-01-12 DIAGNOSIS — D689 Coagulation defect, unspecified: Secondary | ICD-10-CM | POA: Diagnosis not present

## 2020-01-12 DIAGNOSIS — Z992 Dependence on renal dialysis: Secondary | ICD-10-CM | POA: Diagnosis not present

## 2020-01-14 ENCOUNTER — Other Ambulatory Visit: Payer: Self-pay

## 2020-01-14 ENCOUNTER — Emergency Department (HOSPITAL_COMMUNITY): Payer: Medicare Other

## 2020-01-14 ENCOUNTER — Encounter (HOSPITAL_COMMUNITY): Payer: Self-pay

## 2020-01-14 ENCOUNTER — Inpatient Hospital Stay (HOSPITAL_COMMUNITY)
Admission: EM | Admit: 2020-01-14 | Discharge: 2020-01-17 | DRG: 245 | Disposition: A | Discharge status: Home / Self Care | Payer: Medicare Other | Attending: Cardiovascular Disease | Admitting: Cardiovascular Disease

## 2020-01-14 ENCOUNTER — Inpatient Hospital Stay (HOSPITAL_COMMUNITY): Payer: Medicare Other

## 2020-01-14 DIAGNOSIS — I2510 Atherosclerotic heart disease of native coronary artery without angina pectoris: Secondary | ICD-10-CM | POA: Diagnosis present

## 2020-01-14 DIAGNOSIS — I480 Paroxysmal atrial fibrillation: Secondary | ICD-10-CM | POA: Diagnosis present

## 2020-01-14 DIAGNOSIS — I252 Old myocardial infarction: Secondary | ICD-10-CM | POA: Diagnosis not present

## 2020-01-14 DIAGNOSIS — Z992 Dependence on renal dialysis: Secondary | ICD-10-CM

## 2020-01-14 DIAGNOSIS — I714 Abdominal aortic aneurysm, without rupture, unspecified: Secondary | ICD-10-CM

## 2020-01-14 DIAGNOSIS — D649 Anemia, unspecified: Secondary | ICD-10-CM | POA: Diagnosis not present

## 2020-01-14 DIAGNOSIS — I132 Hypertensive heart and chronic kidney disease with heart failure and with stage 5 chronic kidney disease, or end stage renal disease: Secondary | ICD-10-CM | POA: Diagnosis present

## 2020-01-14 DIAGNOSIS — I272 Pulmonary hypertension, unspecified: Secondary | ICD-10-CM | POA: Diagnosis present

## 2020-01-14 DIAGNOSIS — E1151 Type 2 diabetes mellitus with diabetic peripheral angiopathy without gangrene: Secondary | ICD-10-CM | POA: Diagnosis not present

## 2020-01-14 DIAGNOSIS — S2242XA Multiple fractures of ribs, left side, initial encounter for closed fracture: Secondary | ICD-10-CM | POA: Diagnosis present

## 2020-01-14 DIAGNOSIS — N2581 Secondary hyperparathyroidism of renal origin: Secondary | ICD-10-CM | POA: Diagnosis present

## 2020-01-14 DIAGNOSIS — I472 Ventricular tachycardia, unspecified: Secondary | ICD-10-CM

## 2020-01-14 DIAGNOSIS — Z8616 Personal history of COVID-19: Secondary | ICD-10-CM

## 2020-01-14 DIAGNOSIS — I10 Essential (primary) hypertension: Secondary | ICD-10-CM | POA: Diagnosis present

## 2020-01-14 DIAGNOSIS — R55 Syncope and collapse: Secondary | ICD-10-CM | POA: Diagnosis not present

## 2020-01-14 DIAGNOSIS — Z041 Encounter for examination and observation following transport accident: Secondary | ICD-10-CM | POA: Diagnosis not present

## 2020-01-14 DIAGNOSIS — S2239XA Fracture of one rib, unspecified side, initial encounter for closed fracture: Secondary | ICD-10-CM | POA: Insufficient documentation

## 2020-01-14 DIAGNOSIS — D689 Coagulation defect, unspecified: Secondary | ICD-10-CM | POA: Diagnosis not present

## 2020-01-14 DIAGNOSIS — Z79899 Other long term (current) drug therapy: Secondary | ICD-10-CM | POA: Diagnosis not present

## 2020-01-14 DIAGNOSIS — Z8249 Family history of ischemic heart disease and other diseases of the circulatory system: Secondary | ICD-10-CM

## 2020-01-14 DIAGNOSIS — R000 Tachycardia, unspecified: Secondary | ICD-10-CM | POA: Diagnosis not present

## 2020-01-14 DIAGNOSIS — Z87891 Personal history of nicotine dependence: Secondary | ICD-10-CM

## 2020-01-14 DIAGNOSIS — S2249XA Multiple fractures of ribs, unspecified side, initial encounter for closed fracture: Secondary | ICD-10-CM | POA: Diagnosis present

## 2020-01-14 DIAGNOSIS — I462 Cardiac arrest due to underlying cardiac condition: Secondary | ICD-10-CM | POA: Diagnosis present

## 2020-01-14 DIAGNOSIS — Z95810 Presence of automatic (implantable) cardiac defibrillator: Secondary | ICD-10-CM | POA: Diagnosis not present

## 2020-01-14 DIAGNOSIS — N186 End stage renal disease: Secondary | ICD-10-CM

## 2020-01-14 DIAGNOSIS — I951 Orthostatic hypotension: Secondary | ICD-10-CM | POA: Diagnosis present

## 2020-01-14 DIAGNOSIS — E1122 Type 2 diabetes mellitus with diabetic chronic kidney disease: Secondary | ICD-10-CM | POA: Diagnosis not present

## 2020-01-14 DIAGNOSIS — E872 Acidosis: Secondary | ICD-10-CM | POA: Diagnosis not present

## 2020-01-14 DIAGNOSIS — I4901 Ventricular fibrillation: Secondary | ICD-10-CM | POA: Diagnosis not present

## 2020-01-14 DIAGNOSIS — I447 Left bundle-branch block, unspecified: Secondary | ICD-10-CM | POA: Diagnosis not present

## 2020-01-14 DIAGNOSIS — E876 Hypokalemia: Secondary | ICD-10-CM | POA: Diagnosis not present

## 2020-01-14 DIAGNOSIS — I120 Hypertensive chronic kidney disease with stage 5 chronic kidney disease or end stage renal disease: Secondary | ICD-10-CM | POA: Diagnosis not present

## 2020-01-14 DIAGNOSIS — S3991XA Unspecified injury of abdomen, initial encounter: Secondary | ICD-10-CM | POA: Diagnosis not present

## 2020-01-14 DIAGNOSIS — T1490XA Injury, unspecified, initial encounter: Secondary | ICD-10-CM

## 2020-01-14 DIAGNOSIS — I255 Ischemic cardiomyopathy: Secondary | ICD-10-CM | POA: Diagnosis not present

## 2020-01-14 DIAGNOSIS — Z7982 Long term (current) use of aspirin: Secondary | ICD-10-CM

## 2020-01-14 DIAGNOSIS — I429 Cardiomyopathy, unspecified: Secondary | ICD-10-CM | POA: Diagnosis not present

## 2020-01-14 DIAGNOSIS — I739 Peripheral vascular disease, unspecified: Secondary | ICD-10-CM | POA: Diagnosis present

## 2020-01-14 DIAGNOSIS — N185 Chronic kidney disease, stage 5: Secondary | ICD-10-CM | POA: Diagnosis not present

## 2020-01-14 DIAGNOSIS — I517 Cardiomegaly: Secondary | ICD-10-CM | POA: Diagnosis not present

## 2020-01-14 DIAGNOSIS — I469 Cardiac arrest, cause unspecified: Secondary | ICD-10-CM

## 2020-01-14 DIAGNOSIS — R0789 Other chest pain: Secondary | ICD-10-CM | POA: Diagnosis not present

## 2020-01-14 DIAGNOSIS — I5042 Chronic combined systolic (congestive) and diastolic (congestive) heart failure: Secondary | ICD-10-CM | POA: Diagnosis not present

## 2020-01-14 DIAGNOSIS — Y92410 Unspecified street and highway as the place of occurrence of the external cause: Secondary | ICD-10-CM | POA: Diagnosis not present

## 2020-01-14 DIAGNOSIS — Z7902 Long term (current) use of antithrombotics/antiplatelets: Secondary | ICD-10-CM

## 2020-01-14 DIAGNOSIS — Z743 Need for continuous supervision: Secondary | ICD-10-CM | POA: Diagnosis not present

## 2020-01-14 DIAGNOSIS — Z955 Presence of coronary angioplasty implant and graft: Secondary | ICD-10-CM

## 2020-01-14 DIAGNOSIS — E785 Hyperlipidemia, unspecified: Secondary | ICD-10-CM | POA: Diagnosis present

## 2020-01-14 DIAGNOSIS — Z452 Encounter for adjustment and management of vascular access device: Secondary | ICD-10-CM | POA: Diagnosis not present

## 2020-01-14 DIAGNOSIS — D509 Iron deficiency anemia, unspecified: Secondary | ICD-10-CM | POA: Diagnosis not present

## 2020-01-14 DIAGNOSIS — I4891 Unspecified atrial fibrillation: Secondary | ICD-10-CM | POA: Diagnosis not present

## 2020-01-14 DIAGNOSIS — S0990XA Unspecified injury of head, initial encounter: Secondary | ICD-10-CM | POA: Diagnosis not present

## 2020-01-14 DIAGNOSIS — Z8673 Personal history of transient ischemic attack (TIA), and cerebral infarction without residual deficits: Secondary | ICD-10-CM

## 2020-01-14 DIAGNOSIS — S299XXA Unspecified injury of thorax, initial encounter: Secondary | ICD-10-CM | POA: Diagnosis not present

## 2020-01-14 DIAGNOSIS — D631 Anemia in chronic kidney disease: Secondary | ICD-10-CM | POA: Diagnosis not present

## 2020-01-14 HISTORY — DX: Fracture of one rib, unspecified side, initial encounter for closed fracture: S22.39XA

## 2020-01-14 HISTORY — DX: Cardiac arrest, cause unspecified: I46.9

## 2020-01-14 LAB — CBC
HCT: 29.5 % — ABNORMAL LOW (ref 39.0–52.0)
HCT: 27.9 % — ABNORMAL LOW (ref 39.0–52.0)
Hemoglobin: 9.6 g/dL — ABNORMAL LOW (ref 13.0–17.0)
Hemoglobin: 9.5 g/dL — ABNORMAL LOW (ref 13.0–17.0)
MCH: 32.5 pg (ref 26.0–34.0)
MCH: 32.9 pg (ref 26.0–34.0)
MCHC: 32.5 g/dL (ref 30.0–36.0)
MCHC: 34.1 g/dL (ref 30.0–36.0)
MCV: 100 fL (ref 80.0–100.0)
MCV: 96.5 fL (ref 80.0–100.0)
Platelets: 231 10*3/uL (ref 150–400)
Platelets: 251 10*3/uL (ref 150–400)
RBC: 2.95 MIL/uL — ABNORMAL LOW (ref 4.22–5.81)
RBC: 2.89 MIL/uL — ABNORMAL LOW (ref 4.22–5.81)
RDW: 14.1 % (ref 11.5–15.5)
RDW: 14 % (ref 11.5–15.5)
WBC: 8.9 10*3/uL (ref 4.0–10.5)
WBC: 10 10*3/uL (ref 4.0–10.5)
nRBC: 0 % (ref 0.0–0.2)
nRBC: 0 % (ref 0.0–0.2)

## 2020-01-14 LAB — TROPONIN I (HIGH SENSITIVITY)
Troponin I (High Sensitivity): 24 ng/L — ABNORMAL HIGH (ref <18)
Troponin I (High Sensitivity): 1049 ng/L (ref <18)

## 2020-01-14 LAB — BRAIN NATRIURETIC PEPTIDE: B Natriuretic Peptide: 1469.3 pg/mL — ABNORMAL HIGH (ref 0.0–100.0)

## 2020-01-14 LAB — I-STAT CHEM 8, ED
BUN: 10 mg/dL (ref 8–23)
Calcium, Ion: 0.94 mmol/L — ABNORMAL LOW (ref 1.15–1.40)
Chloride: 93 mmol/L — ABNORMAL LOW (ref 98–111)
Creatinine, Ser: 2.7 mg/dL — ABNORMAL HIGH (ref 0.61–1.24)
Glucose, Bld: 198 mg/dL — ABNORMAL HIGH (ref 70–99)
HCT: 29 % — ABNORMAL LOW (ref 39.0–52.0)
Hemoglobin: 9.9 g/dL — ABNORMAL LOW (ref 13.0–17.0)
Potassium: 3.2 mmol/L — ABNORMAL LOW (ref 3.5–5.1)
Sodium: 136 mmol/L (ref 135–145)
TCO2: 26 mmol/L (ref 22–32)

## 2020-01-14 LAB — COMPREHENSIVE METABOLIC PANEL
ALT: 12 U/L (ref 0–44)
AST: 18 U/L (ref 15–41)
Albumin: 3.2 g/dL — ABNORMAL LOW (ref 3.5–5.0)
Alkaline Phosphatase: 56 U/L (ref 38–126)
Anion gap: 13 (ref 5–15)
BUN: 9 mg/dL (ref 8–23)
CO2: 27 mmol/L (ref 22–32)
Calcium: 8.1 mg/dL — ABNORMAL LOW (ref 8.9–10.3)
Chloride: 95 mmol/L — ABNORMAL LOW (ref 98–111)
Creatinine, Ser: 2.78 mg/dL — ABNORMAL HIGH (ref 0.61–1.24)
GFR calc Af Amer: 26 mL/min — ABNORMAL LOW (ref >60)
GFR calc non Af Amer: 23 mL/min — ABNORMAL LOW (ref >60)
Glucose, Bld: 207 mg/dL — ABNORMAL HIGH (ref 70–99)
Potassium: 3.2 mmol/L — ABNORMAL LOW (ref 3.5–5.1)
Sodium: 135 mmol/L (ref 135–145)
Total Bilirubin: 1.1 mg/dL (ref 0.3–1.2)
Total Protein: 6.4 g/dL — ABNORMAL LOW (ref 6.5–8.1)

## 2020-01-14 LAB — ETHANOL: Alcohol, Ethyl (B): <10 mg/dL (ref <10)

## 2020-01-14 LAB — SARS CORONAVIRUS 2 BY RT PCR (HOSPITAL ORDER, PERFORMED IN ~~LOC~~ HOSPITAL LAB): SARS Coronavirus 2: NEGATIVE

## 2020-01-14 LAB — LACTIC ACID, PLASMA: Lactic Acid, Venous: 2.6 mmol/L (ref 0.5–1.9)

## 2020-01-14 LAB — PROTIME-INR
INR: 1.1 (ref 0.8–1.2)
Prothrombin Time: 13.7 seconds (ref 11.4–15.2)

## 2020-01-14 LAB — SURGICAL PCR SCREEN
MRSA, PCR: NEGATIVE
Staphylococcus aureus: NEGATIVE

## 2020-01-14 LAB — SAMPLE TO BLOOD BANK

## 2020-01-14 LAB — HEMOGLOBIN A1C
Hgb A1c MFr Bld: 6.4 % — ABNORMAL HIGH (ref 4.8–5.6)
Mean Plasma Glucose: 136.98 mg/dL

## 2020-01-14 LAB — MAGNESIUM: Magnesium: 1.7 mg/dL (ref 1.7–2.4)

## 2020-01-14 LAB — GLUCOSE, CAPILLARY: Glucose-Capillary: 193 mg/dL — ABNORMAL HIGH (ref 70–99)

## 2020-01-14 LAB — TSH: TSH: 4.356 u[IU]/mL (ref 0.350–4.500)

## 2020-01-14 MED ORDER — AMIODARONE HCL IN DEXTROSE 360-4.14 MG/200ML-% IV SOLN
30.0000 mg/h | INTRAVENOUS | Status: DC
  Administered 2020-01-14 – 2020-01-15 (×3): 30 mg/h via INTRAVENOUS
  Filled 2020-01-14 (×3): qty 200

## 2020-01-14 MED ORDER — ACETAMINOPHEN 325 MG PO TABS
650.0000 mg | ORAL_TABLET | ORAL | Status: DC | PRN
  Administered 2020-01-14: 650 mg via ORAL
  Filled 2020-01-14: qty 2

## 2020-01-14 MED ORDER — SODIUM CHLORIDE 0.9% FLUSH
3.0000 mL | Freq: Two times a day (BID) | INTRAVENOUS | Status: DC
  Administered 2020-01-15 – 2020-01-16 (×2): 3 mL via INTRAVENOUS

## 2020-01-14 MED ORDER — INSULIN ASPART 100 UNIT/ML ~~LOC~~ SOLN
0.0000 [IU] | Freq: Three times a day (TID) | SUBCUTANEOUS | Status: DC
  Administered 2020-01-16: 8 [IU] via SUBCUTANEOUS
  Administered 2020-01-16: 2 [IU] via SUBCUTANEOUS
  Administered 2020-01-17: 3 [IU] via SUBCUTANEOUS

## 2020-01-14 MED ORDER — LIDOCAINE IN D5W 4-5 MG/ML-% IV SOLN
2.0000 mg/min | INTRAVENOUS | Status: DC
  Administered 2020-01-14: 2 mg/min via INTRAVENOUS
  Filled 2020-01-14: qty 500

## 2020-01-14 MED ORDER — HEPARIN SODIUM (PORCINE) 5000 UNIT/ML IJ SOLN: 5000.0000 [IU] | Freq: Three times a day (TID) | INTRAMUSCULAR | Status: DC

## 2020-01-14 MED ORDER — CALCITRIOL 0.5 MCG PO CAPS
1.7500 ug | ORAL_CAPSULE | ORAL | Status: DC
  Administered 2020-01-16: 1.75 ug via ORAL
  Filled 2020-01-14: qty 7

## 2020-01-14 MED ORDER — RENA-VITE PO TABS
1.0000 | ORAL_TABLET | Freq: Every day | ORAL | Status: DC
  Administered 2020-01-14 – 2020-01-16 (×3): 1 via ORAL
  Filled 2020-01-14 (×3): qty 1

## 2020-01-14 MED ORDER — IOHEXOL 300 MG/ML  SOLN: 100.0000 mL | Freq: Once | INTRAMUSCULAR | Status: DC | PRN

## 2020-01-14 MED ORDER — MIDODRINE HCL 5 MG PO TABS: 5.0000 mg | ORAL_TABLET | ORAL | Status: DC

## 2020-01-14 MED ORDER — ONDANSETRON HCL 4 MG/2ML IJ SOLN: 4.0000 mg | Freq: Four times a day (QID) | INTRAMUSCULAR | Status: DC | PRN

## 2020-01-14 MED ORDER — SODIUM CHLORIDE 0.9% FLUSH: 3.0000 mL | INTRAVENOUS | Status: DC | PRN

## 2020-01-14 MED ORDER — ALPRAZOLAM 0.25 MG PO TABS
0.2500 mg | ORAL_TABLET | Freq: Two times a day (BID) | ORAL | Status: DC | PRN
  Administered 2020-01-14 – 2020-01-15 (×2): 0.25 mg via ORAL
  Filled 2020-01-14 (×2): qty 1

## 2020-01-14 MED ORDER — HEPARIN (PORCINE) 25000 UT/250ML-% IV SOLN
1450.0000 [IU]/h | INTRAVENOUS | Status: DC
  Administered 2020-01-14: 1300 [IU]/h via INTRAVENOUS
  Filled 2020-01-14: qty 250

## 2020-01-14 MED ORDER — OXYCODONE-ACETAMINOPHEN 5-325 MG PO TABS
1.0000 | ORAL_TABLET | ORAL | Status: DC | PRN
  Administered 2020-01-14 – 2020-01-15 (×2): 1 via ORAL
  Filled 2020-01-14 (×2): qty 1

## 2020-01-14 MED ORDER — CALCIUM GLUCONATE-NACL 1-0.675 GM/50ML-% IV SOLN
1.0000 g | Freq: Once | INTRAVENOUS | Status: AC
  Administered 2020-01-14: 1000 mg via INTRAVENOUS
  Filled 2020-01-14: qty 50

## 2020-01-14 MED ORDER — SODIUM CHLORIDE 0.9% FLUSH
10.0000 mL | Freq: Two times a day (BID) | INTRAVENOUS | Status: DC
  Administered 2020-01-14: 20 mL
  Administered 2020-01-15 – 2020-01-16 (×2): 10 mL

## 2020-01-14 MED ORDER — SODIUM CHLORIDE 0.9 % WEIGHT BASED INFUSION: 1.0000 mL/kg/h | INTRAVENOUS | Status: DC

## 2020-01-14 MED ORDER — HEPARIN BOLUS VIA INFUSION
5000.0000 [IU] | Freq: Once | INTRAVENOUS | Status: DC
  Filled 2020-01-14: qty 5000

## 2020-01-14 MED ORDER — ATORVASTATIN CALCIUM 40 MG PO TABS
40.0000 mg | ORAL_TABLET | Freq: Every evening | ORAL | Status: DC
  Administered 2020-01-14 – 2020-01-16 (×3): 40 mg via ORAL
  Filled 2020-01-14 (×3): qty 1

## 2020-01-14 MED ORDER — AMIODARONE IV BOLUS ONLY 150 MG/100ML
150.0000 mg | Freq: Once | INTRAVENOUS | Status: AC
  Administered 2020-01-14: 150 mg via INTRAVENOUS

## 2020-01-14 MED ORDER — MORPHINE SULFATE (PF) 4 MG/ML IV SOLN
4.0000 mg | Freq: Once | INTRAVENOUS | Status: AC
  Administered 2020-01-14: 4 mg via INTRAVENOUS
  Filled 2020-01-14: qty 1

## 2020-01-14 MED ORDER — HYDRALAZINE HCL 25 MG PO TABS
25.0000 mg | ORAL_TABLET | Freq: Three times a day (TID) | ORAL | Status: DC
  Administered 2020-01-14 – 2020-01-17 (×7): 25 mg via ORAL
  Filled 2020-01-14 (×7): qty 1

## 2020-01-14 MED ORDER — SODIUM CHLORIDE 0.9 % IV SOLN: 250.0000 mL | INTRAVENOUS | Status: DC | PRN

## 2020-01-14 MED ORDER — AMIODARONE HCL IN DEXTROSE 360-4.14 MG/200ML-% IV SOLN
60.0000 mg/h | INTRAVENOUS | Status: AC
  Administered 2020-01-14 (×2): 60 mg/h via INTRAVENOUS
  Filled 2020-01-14: qty 200

## 2020-01-14 MED ORDER — POTASSIUM CHLORIDE CRYS ER 20 MEQ PO TBCR
80.0000 meq | EXTENDED_RELEASE_TABLET | Freq: Two times a day (BID) | ORAL | Status: DC
  Administered 2020-01-14 – 2020-01-15 (×3): 80 meq via ORAL
  Filled 2020-01-14 (×4): qty 4

## 2020-01-14 MED ORDER — SODIUM CHLORIDE 0.9 % IV BOLUS: 125.0000 mL | Freq: Once | INTRAVENOUS | Status: DC

## 2020-01-14 MED ORDER — SODIUM CHLORIDE 0.9% FLUSH: 10.0000 mL | INTRAVENOUS | Status: DC | PRN

## 2020-01-14 MED ORDER — ZOLPIDEM TARTRATE 5 MG PO TABS
5.0000 mg | ORAL_TABLET | Freq: Every evening | ORAL | Status: DC | PRN
  Administered 2020-01-14 – 2020-01-17 (×2): 5 mg via ORAL
  Filled 2020-01-14 (×2): qty 1

## 2020-01-14 MED ORDER — ALBUTEROL SULFATE HFA 108 (90 BASE) MCG/ACT IN AERS
2.0000 | INHALATION_SPRAY | Freq: Four times a day (QID) | RESPIRATORY_TRACT | Status: DC | PRN
  Filled 2020-01-14: qty 6.7

## 2020-01-14 MED ORDER — PROMETHAZINE HCL 25 MG PO TABS: 25.0000 mg | ORAL_TABLET | Freq: Four times a day (QID) | ORAL | Status: DC | PRN

## 2020-01-14 MED ORDER — CHLORHEXIDINE GLUCONATE CLOTH 2 % EX PADS
6.0000 | MEDICATED_PAD | Freq: Every day | CUTANEOUS | Status: DC
  Administered 2020-01-16: 6 via TOPICAL

## 2020-01-14 MED ORDER — SEVELAMER CARBONATE 800 MG PO TABS
2400.0000 mg | ORAL_TABLET | Freq: Three times a day (TID) | ORAL | Status: DC
  Administered 2020-01-15 – 2020-01-17 (×5): 2400 mg via ORAL
  Filled 2020-01-14 (×5): qty 3

## 2020-01-14 MED ORDER — ALLOPURINOL 100 MG PO TABS
100.0000 mg | ORAL_TABLET | Freq: Every day | ORAL | Status: DC
  Administered 2020-01-14 – 2020-01-16 (×3): 100 mg via ORAL
  Filled 2020-01-14 (×3): qty 1

## 2020-01-14 MED ORDER — ASPIRIN 81 MG PO CHEW
81.0000 mg | CHEWABLE_TABLET | ORAL | Status: AC
  Administered 2020-01-15: 81 mg via ORAL
  Filled 2020-01-14: qty 1

## 2020-01-14 MED ORDER — ASPIRIN EC 81 MG PO TBEC
81.0000 mg | DELAYED_RELEASE_TABLET | Freq: Every day | ORAL | Status: DC
  Administered 2020-01-15 – 2020-01-17 (×3): 81 mg via ORAL
  Filled 2020-01-14 (×3): qty 1

## 2020-01-14 MED ORDER — PROSOURCE TF PO LIQD
45.0000 mL | Freq: Two times a day (BID) | ORAL | Status: DC
  Administered 2020-01-14: 45 mL
  Filled 2020-01-14 (×6): qty 45

## 2020-01-14 MED ORDER — SODIUM CHLORIDE 0.9 % WEIGHT BASED INFUSION: 3.0000 mL/kg/h | INTRAVENOUS | Status: DC

## 2020-01-14 MED ORDER — METOPROLOL TARTRATE 25 MG PO TABS
37.5000 mg | ORAL_TABLET | Freq: Two times a day (BID) | ORAL | Status: DC
  Administered 2020-01-15 – 2020-01-16 (×2): 37.5 mg via ORAL
  Filled 2020-01-14 (×5): qty 1

## 2020-01-14 MED ORDER — ASPIRIN 81 MG PO CHEW: 81.0000 mg | CHEWABLE_TABLET | Freq: Every day | ORAL | Status: DC

## 2020-01-14 MED ORDER — NITROGLYCERIN 0.4 MG SL SUBL: 0.4000 mg | SUBLINGUAL_TABLET | SUBLINGUAL | Status: DC | PRN

## 2020-01-14 MED ORDER — MAGNESIUM SULFATE IN D5W 1-5 GM/100ML-% IV SOLN
1.0000 g | Freq: Once | INTRAVENOUS | Status: AC
  Administered 2020-01-14: 1 g via INTRAVENOUS
  Filled 2020-01-14: qty 100

## 2020-01-14 MED ORDER — HEPARIN BOLUS VIA INFUSION
4000.0000 [IU] | Freq: Once | INTRAVENOUS | Status: AC
  Administered 2020-01-14: 4000 [IU] via INTRAVENOUS
  Filled 2020-01-14: qty 4000

## 2020-01-14 MED ORDER — INSULIN ASPART 100 UNIT/ML ~~LOC~~ SOLN
0.0000 [IU] | Freq: Every day | SUBCUTANEOUS | Status: DC
  Administered 2020-01-15: 2 [IU] via SUBCUTANEOUS
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
## 2020-01-15 ENCOUNTER — Inpatient Hospital Stay (HOSPITAL_COMMUNITY): Payer: Medicare Other | Admitting: Certified Registered Nurse Anesthetist

## 2020-01-15 ENCOUNTER — Encounter (HOSPITAL_COMMUNITY)
Admission: EM | Disposition: A | Discharge status: Home / Self Care | Payer: Self-pay | Source: Home / Self Care | Attending: Cardiovascular Disease

## 2020-01-15 ENCOUNTER — Inpatient Hospital Stay (HOSPITAL_COMMUNITY): Payer: Medicare Other

## 2020-01-15 ENCOUNTER — Encounter (HOSPITAL_COMMUNITY): Payer: Self-pay | Admitting: Cardiovascular Disease

## 2020-01-15 DIAGNOSIS — I255 Ischemic cardiomyopathy: Secondary | ICD-10-CM

## 2020-01-15 DIAGNOSIS — I4901 Ventricular fibrillation: Secondary | ICD-10-CM

## 2020-01-15 DIAGNOSIS — I469 Cardiac arrest, cause unspecified: Secondary | ICD-10-CM

## 2020-01-15 HISTORY — PX: SUBQ ICD IMPLANT: EP1223

## 2020-01-15 HISTORY — PX: LEFT HEART CATH AND CORONARY ANGIOGRAPHY: CATH118249

## 2020-01-15 LAB — BASIC METABOLIC PANEL
Anion gap: 11 (ref 5–15)
BUN: 17 mg/dL (ref 8–23)
CO2: 27 mmol/L (ref 22–32)
Calcium: 8.2 mg/dL — ABNORMAL LOW (ref 8.9–10.3)
Chloride: 93 mmol/L — ABNORMAL LOW (ref 98–111)
Creatinine, Ser: 4.01 mg/dL — ABNORMAL HIGH (ref 0.61–1.24)
GFR calc Af Amer: 17 mL/min — ABNORMAL LOW (ref >60)
GFR calc non Af Amer: 15 mL/min — ABNORMAL LOW (ref >60)
Glucose, Bld: 172 mg/dL — ABNORMAL HIGH (ref 70–99)
Potassium: 4 mmol/L (ref 3.5–5.1)
Sodium: 131 mmol/L — ABNORMAL LOW (ref 135–145)

## 2020-01-15 LAB — CBC
HCT: 27.7 % — ABNORMAL LOW (ref 39.0–52.0)
HCT: 27.2 % — ABNORMAL LOW (ref 39.0–52.0)
Hemoglobin: 9.1 g/dL — ABNORMAL LOW (ref 13.0–17.0)
Hemoglobin: 8.8 g/dL — ABNORMAL LOW (ref 13.0–17.0)
MCH: 31.9 pg (ref 26.0–34.0)
MCH: 31.9 pg (ref 26.0–34.0)
MCHC: 32.9 g/dL (ref 30.0–36.0)
MCHC: 32.4 g/dL (ref 30.0–36.0)
MCV: 97.2 fL (ref 80.0–100.0)
MCV: 98.6 fL (ref 80.0–100.0)
Platelets: 227 10*3/uL (ref 150–400)
Platelets: 232 10*3/uL (ref 150–400)
RBC: 2.85 MIL/uL — ABNORMAL LOW (ref 4.22–5.81)
RBC: 2.76 MIL/uL — ABNORMAL LOW (ref 4.22–5.81)
RDW: 14.1 % (ref 11.5–15.5)
RDW: 14.3 % (ref 11.5–15.5)
WBC: 8.4 10*3/uL (ref 4.0–10.5)
WBC: 7 10*3/uL (ref 4.0–10.5)
nRBC: 0 % (ref 0.0–0.2)
nRBC: 0 % (ref 0.0–0.2)

## 2020-01-15 LAB — LIPID PANEL
Cholesterol: 180 mg/dL (ref 0–200)
HDL: 28 mg/dL — ABNORMAL LOW (ref >40)
LDL Cholesterol: 117 mg/dL — ABNORMAL HIGH (ref 0–99)
Total CHOL/HDL Ratio: 6.4 RATIO
Triglycerides: 175 mg/dL — ABNORMAL HIGH (ref <150)
VLDL: 35 mg/dL (ref 0–40)

## 2020-01-15 LAB — RENAL FUNCTION PANEL
Albumin: 3.1 g/dL — ABNORMAL LOW (ref 3.5–5.0)
Anion gap: 13 (ref 5–15)
BUN: 21 mg/dL (ref 8–23)
CO2: 25 mmol/L (ref 22–32)
Calcium: 8.3 mg/dL — ABNORMAL LOW (ref 8.9–10.3)
Chloride: 95 mmol/L — ABNORMAL LOW (ref 98–111)
Creatinine, Ser: 4.51 mg/dL — ABNORMAL HIGH (ref 0.61–1.24)
GFR calc Af Amer: 15 mL/min — ABNORMAL LOW (ref >60)
GFR calc non Af Amer: 13 mL/min — ABNORMAL LOW (ref >60)
Glucose, Bld: 119 mg/dL — ABNORMAL HIGH (ref 70–99)
Phosphorus: 4.3 mg/dL (ref 2.5–4.6)
Potassium: 4.4 mmol/L (ref 3.5–5.1)
Sodium: 133 mmol/L — ABNORMAL LOW (ref 135–145)

## 2020-01-15 LAB — GLUCOSE, CAPILLARY
Glucose-Capillary: 159 mg/dL — ABNORMAL HIGH (ref 70–99)
Glucose-Capillary: 172 mg/dL — ABNORMAL HIGH (ref 70–99)
Glucose-Capillary: 157 mg/dL — ABNORMAL HIGH (ref 70–99)
Glucose-Capillary: 107 mg/dL — ABNORMAL HIGH (ref 70–99)
Glucose-Capillary: 214 mg/dL — ABNORMAL HIGH (ref 70–99)
Glucose-Capillary: 228 mg/dL — ABNORMAL HIGH (ref 70–99)

## 2020-01-15 LAB — ECHOCARDIOGRAM COMPLETE
Area-P 1/2: 2.36 cm2
Height: 72 in
S' Lateral: 5.1 cm
Weight: 3132.3 oz

## 2020-01-15 LAB — HEPARIN LEVEL (UNFRACTIONATED): Heparin Unfractionated: 0.19 IU/mL — ABNORMAL LOW (ref 0.30–0.70)

## 2020-01-15 LAB — POCT ACTIVATED CLOTTING TIME: Activated Clotting Time: 131 seconds

## 2020-01-15 SURGERY — SUBQ ICD IMPLANT
Anesthesia: General

## 2020-01-15 SURGERY — LEFT HEART CATH AND CORONARY ANGIOGRAPHY
Anesthesia: LOCAL

## 2020-01-15 MED ORDER — LIDOCAINE HCL (PF) 1 % IJ SOLN
INTRAMUSCULAR | Status: AC
  Filled 2020-01-15: qty 30

## 2020-01-15 MED ORDER — EPHEDRINE SULFATE 50 MG/ML IJ SOLN
INTRAMUSCULAR | Status: DC | PRN
  Administered 2020-01-15 (×2): 10 mg via INTRAVENOUS
  Administered 2020-01-15: 20 mg via INTRAVENOUS
  Administered 2020-01-15: 10 mg via INTRAVENOUS

## 2020-01-15 MED ORDER — ONDANSETRON HCL 4 MG/2ML IJ SOLN
4.0000 mg | Freq: Four times a day (QID) | INTRAMUSCULAR | Status: DC | PRN
  Administered 2020-01-15: 4 mg via INTRAVENOUS
  Filled 2020-01-15: qty 2

## 2020-01-15 MED ORDER — SODIUM CHLORIDE 0.9% FLUSH: 3.0000 mL | INTRAVENOUS | Status: DC | PRN

## 2020-01-15 MED ORDER — FENTANYL CITRATE (PF) 100 MCG/2ML IJ SOLN
INTRAMUSCULAR | Status: DC | PRN
Start: 2020-01-15 — End: 2020-01-15
  Administered 2020-01-15: 50 ug via INTRAVENOUS
  Administered 2020-01-15: 25 ug via INTRAVENOUS
  Administered 2020-01-15 (×2): 50 ug via INTRAVENOUS

## 2020-01-15 MED ORDER — MIDAZOLAM HCL 2 MG/2ML IJ SOLN
INTRAMUSCULAR | Status: DC | PRN
  Administered 2020-01-15: 0.5 mg via INTRAVENOUS

## 2020-01-15 MED ORDER — SODIUM CHLORIDE 0.9 % IV SOLN: 250.0000 mL | INTRAVENOUS | Status: DC | PRN

## 2020-01-15 MED ORDER — HYDRALAZINE HCL 20 MG/ML IJ SOLN: 10.0000 mg | INTRAMUSCULAR | Status: AC | PRN

## 2020-01-15 MED ORDER — ALTEPLASE 2 MG IJ SOLR: 2.0000 mg | Freq: Once | INTRAMUSCULAR | Status: DC | PRN

## 2020-01-15 MED ORDER — SODIUM CHLORIDE 0.9 % IV SOLN: INTRAVENOUS | Status: DC

## 2020-01-15 MED ORDER — CEFAZOLIN SODIUM-DEXTROSE 2-4 GM/100ML-% IV SOLN
2.0000 g | INTRAVENOUS | Status: AC
  Administered 2020-01-15: 2 g via INTRAVENOUS
  Filled 2020-01-15 (×2): qty 100

## 2020-01-15 MED ORDER — LIDOCAINE HCL (PF) 1 % IJ SOLN: 5.0000 mL | INTRAMUSCULAR | Status: DC | PRN

## 2020-01-15 MED ORDER — FENTANYL CITRATE (PF) 100 MCG/2ML IJ SOLN
INTRAMUSCULAR | Status: DC | PRN
Start: 2020-01-15 — End: 2020-01-15
  Administered 2020-01-15: 25 ug via INTRAVENOUS

## 2020-01-15 MED ORDER — PERFLUTREN LIPID MICROSPHERE
1.0000 mL | INTRAVENOUS | Status: AC | PRN
  Administered 2020-01-15: 4 mL via INTRAVENOUS
  Filled 2020-01-15: qty 10

## 2020-01-15 MED ORDER — VANCOMYCIN HCL IN DEXTROSE 1-5 GM/200ML-% IV SOLN
1000.0000 mg | Freq: Once | INTRAVENOUS | Status: AC
  Administered 2020-01-16: 1000 mg via INTRAVENOUS
  Filled 2020-01-15: qty 200

## 2020-01-15 MED ORDER — PENTAFLUOROPROP-TETRAFLUOROETH EX AERO: 1.0000 "application " | INHALATION_SPRAY | CUTANEOUS | Status: DC | PRN

## 2020-01-15 MED ORDER — MIDAZOLAM HCL 2 MG/2ML IJ SOLN
INTRAMUSCULAR | Status: DC | PRN
  Administered 2020-01-15: 2 mg via INTRAVENOUS

## 2020-01-15 MED ORDER — FENTANYL CITRATE (PF) 100 MCG/2ML IJ SOLN
INTRAMUSCULAR | Status: AC
  Filled 2020-01-15: qty 2

## 2020-01-15 MED ORDER — CHLORHEXIDINE GLUCONATE 4 % EX LIQD
60.0000 mL | Freq: Once | CUTANEOUS | Status: AC
  Administered 2020-01-15: 4 via TOPICAL

## 2020-01-15 MED ORDER — SODIUM CHLORIDE 0.9 % IV SOLN: 100.0000 mL | INTRAVENOUS | Status: DC | PRN

## 2020-01-15 MED ORDER — HYDRALAZINE HCL 20 MG/ML IJ SOLN
10.0000 mg | Freq: Four times a day (QID) | INTRAMUSCULAR | Status: DC | PRN
  Administered 2020-01-15: 10 mg via INTRAVENOUS
  Filled 2020-01-15: qty 1

## 2020-01-15 MED ORDER — NITROGLYCERIN 1 MG/10 ML FOR IR/CATH LAB
INTRA_ARTERIAL | Status: AC
  Filled 2020-01-15: qty 10

## 2020-01-15 MED ORDER — PROPOFOL 10 MG/ML IV BOLUS
INTRAVENOUS | Status: DC | PRN
  Administered 2020-01-15: 50 mg via INTRAVENOUS

## 2020-01-15 MED ORDER — HEPARIN (PORCINE) IN NACL 1000-0.9 UT/500ML-% IV SOLN
INTRAVENOUS | Status: AC
  Filled 2020-01-15: qty 500

## 2020-01-15 MED ORDER — PHENYLEPHRINE HCL-NACL 10-0.9 MG/250ML-% IV SOLN
INTRAVENOUS | Status: DC | PRN
  Administered 2020-01-15: 25 ug/min via INTRAVENOUS

## 2020-01-15 MED ORDER — LIDOCAINE HCL (PF) 1 % IJ SOLN
INTRAMUSCULAR | Status: DC | PRN
  Administered 2020-01-15: 20 mL

## 2020-01-15 MED ORDER — SODIUM CHLORIDE 0.9 % IV SOLN
80.0000 mg | INTRAVENOUS | Status: AC
  Administered 2020-01-15: 80 mg
  Filled 2020-01-15 (×2): qty 2

## 2020-01-15 MED ORDER — IOHEXOL 350 MG/ML SOLN
INTRAVENOUS | Status: DC | PRN
  Administered 2020-01-15: 65 mL via INTRA_ARTERIAL

## 2020-01-15 MED ORDER — BUPIVACAINE HCL (PF) 0.25 % IJ SOLN
INTRAMUSCULAR | Status: DC | PRN
  Administered 2020-01-15: 40 mL

## 2020-01-15 MED ORDER — ONDANSETRON HCL 4 MG/2ML IJ SOLN
INTRAMUSCULAR | Status: DC | PRN
  Administered 2020-01-15: 4 mg via INTRAVENOUS

## 2020-01-15 MED ORDER — HEPARIN (PORCINE) IN NACL 1000-0.9 UT/500ML-% IV SOLN
INTRAVENOUS | Status: DC | PRN
  Administered 2020-01-15: 500 mL

## 2020-01-15 MED ORDER — HEPARIN (PORCINE) IN NACL 1000-0.9 UT/500ML-% IV SOLN
INTRAVENOUS | Status: DC | PRN
  Administered 2020-01-15 (×2): 500 mL

## 2020-01-15 MED ORDER — DEXAMETHASONE SODIUM PHOSPHATE 4 MG/ML IJ SOLN
INTRAMUSCULAR | Status: DC | PRN
  Administered 2020-01-15: 5 mg via INTRAVENOUS

## 2020-01-15 MED ORDER — BUPIVACAINE HCL (PF) 0.25 % IJ SOLN
INTRAMUSCULAR | Status: AC
  Filled 2020-01-15: qty 60

## 2020-01-15 MED ORDER — LIDOCAINE-PRILOCAINE 2.5-2.5 % EX CREA: 1.0000 "application " | TOPICAL_CREAM | CUTANEOUS | Status: DC | PRN

## 2020-01-15 MED ORDER — SODIUM CHLORIDE 0.9 % IV SOLN
INTRAVENOUS | Status: AC
  Filled 2020-01-15: qty 2

## 2020-01-15 MED ORDER — SODIUM CHLORIDE 0.9 % IV SOLN
INTRAVENOUS | Status: DC | PRN
  Administered 2020-01-15: 500 mL

## 2020-01-15 MED ORDER — HEPARIN SODIUM (PORCINE) 1000 UNIT/ML IJ SOLN
INTRAMUSCULAR | Status: AC
  Filled 2020-01-15: qty 1

## 2020-01-15 MED ORDER — LABETALOL HCL 5 MG/ML IV SOLN: 10.0000 mg | INTRAVENOUS | Status: AC | PRN

## 2020-01-15 MED ORDER — HEPARIN (PORCINE) IN NACL 1000-0.9 UT/500ML-% IV SOLN
INTRAVENOUS | Status: AC
  Filled 2020-01-15: qty 1000

## 2020-01-15 MED ORDER — HEPARIN SODIUM (PORCINE) 1000 UNIT/ML DIALYSIS
1000.0000 [IU] | INTRAMUSCULAR | Status: DC | PRN
  Filled 2020-01-15: qty 1

## 2020-01-15 MED ORDER — SODIUM CHLORIDE 0.9 % IV SOLN: INTRAVENOUS | Status: DC | PRN

## 2020-01-15 MED ORDER — ACETAMINOPHEN 325 MG PO TABS: 325.0000 mg | ORAL_TABLET | ORAL | Status: DC | PRN

## 2020-01-15 MED ORDER — MIDAZOLAM HCL 2 MG/2ML IJ SOLN
INTRAMUSCULAR | Status: AC
  Filled 2020-01-15: qty 2

## 2020-01-15 MED ORDER — SODIUM CHLORIDE 0.9% FLUSH
3.0000 mL | Freq: Two times a day (BID) | INTRAVENOUS | Status: DC
  Administered 2020-01-15 – 2020-01-16 (×3): 3 mL via INTRAVENOUS

## 2020-01-15 MED ORDER — PHENYLEPHRINE HCL (PRESSORS) 10 MG/ML IV SOLN
INTRAVENOUS | Status: DC | PRN
  Administered 2020-01-15: 10 ug via INTRAVENOUS

## 2020-01-15 MED ORDER — EPINEPHRINE 1 MG/10ML IJ SOSY
PREFILLED_SYRINGE | INTRAMUSCULAR | Status: DC | PRN
  Administered 2020-01-15: .01 mg via INTRAVENOUS

## 2020-01-15 MED ORDER — CEFAZOLIN SODIUM-DEXTROSE 2-4 GM/100ML-% IV SOLN
INTRAVENOUS | Status: AC
  Filled 2020-01-15: qty 100

## 2020-01-15 SURGICAL SUPPLY — 13 items
CATH INFINITI 5FR MULTPACK ANG (CATHETERS) ×1 IMPLANT
CATH VISTA GUIDE 6FR XBLAD4 (CATHETERS) ×1 IMPLANT
ELECT DEFIB PAD ADLT CADENCE (PAD) ×1 IMPLANT
KIT HEART LEFT (KITS) ×2 IMPLANT
KIT MICROPUNCTURE NIT STIFF (SHEATH) ×1 IMPLANT
PACK CARDIAC CATHETERIZATION (CUSTOM PROCEDURE TRAY) ×2 IMPLANT
SHEATH PINNACLE 5F 10CM (SHEATH) IMPLANT
SHEATH PINNACLE 6F 10CM (SHEATH) ×1 IMPLANT
SHEATH PROBE COVER 6X72 (BAG) ×1 IMPLANT
TRANSDUCER W/STOPCOCK (MISCELLANEOUS) ×2 IMPLANT
TUBING CIL FLEX 10 FLL-RA (TUBING) ×2 IMPLANT
WIRE EMERALD 3MM-J .035X150CM (WIRE) ×1 IMPLANT
WIRE HI TORQ VERSACORE-J 145CM (WIRE) ×1 IMPLANT

## 2020-01-15 SURGICAL SUPPLY — 5 items
CABLE SURGICAL S-101-97-12 (CABLE) ×3 IMPLANT
ICD SUBCU MRI EMBLEM A219 (ICD Generator) ×2 IMPLANT
LEAD SUBQU EMBLEM 3501 (Pacemaker) ×2 IMPLANT
PAD PRO RADIOLUCENT 2001M-C (PAD) ×5 IMPLANT
TRAY PACEMAKER INSERTION (PACKS) ×3 IMPLANT

## 2020-01-15 NOTE — Anesthesia Procedure Notes (Signed)
Procedure Name: LMA Insertion Date/Time: 01/15/2020 4:09 PM Performed by: Oletta Lamas, CRNA Pre-anesthesia Checklist: Patient identified, Emergency Drugs available, Suction available and Patient being monitored Patient Re-evaluated:Patient Re-evaluated prior to induction Oxygen Delivery Method: Circle System Utilized Preoxygenation: Pre-oxygenation with 100% oxygen Induction Type: IV induction Ventilation: Mask ventilation without difficulty LMA: LMA inserted LMA Size: 4.0 Number of attempts: 1 Placement Confirmation: positive ETCO2 Tube secured with: Tape Dental Injury: Teeth and Oropharynx as per pre-operative assessment

## 2020-01-15 NOTE — Plan of Care (Signed)
°  Problem: Education: Goal: Ability to manage disease process will improve Outcome: Progressing   Problem: Cardiac: Goal: Ability to achieve and maintain adequate cardiopulmonary perfusion will improve Outcome: Progressing   

## 2020-01-15 NOTE — Anesthesia Preprocedure Evaluation (Addendum)
Anesthesia Evaluation  Patient identified by MRN, date of birth, ID band Patient awake    Reviewed: Allergy & Precautions, NPO status , Patient's Chart, lab work & pertinent test results  History of Anesthesia Complications Negative for: history of anesthetic complications  Airway Mallampati: III  TM Distance: >3 FB Neck ROM: Full    Dental  (+) Upper Dentures, Lower Dentures   Pulmonary sleep apnea , former smoker,    Pulmonary exam normal        Cardiovascular hypertension, Pt. on medications and Pt. on home beta blockers + CAD, + Past MI, + Peripheral Vascular Disease and +CHF  + dysrhythmias Atrial Fibrillation  Rhythm:Regular Rate:Bradycardia   Cath (today) - 1.Multivessel coronary artery disease, overall similar in appearance to prior catheterization from January.  Distal LMCA disease remains eccentric and noncritical with approximately 50% stenosis.  There is 50 to 60% stenosis of the proximal LAD involving D1 as well as chronic total occlusions of the distal LCx and ostial RCA. 2.Moderately elevated left ventricular filling pressure (LVEDP 25-30 mmHg).  TTE (today) - EF 40 to 45%. LVinternal cavity size was mildly to moderately dilated. There is moderate asymmetric left ventricular hypertrophy. Grade II diastolic dysfunction (pseudonormalization). There is akinesis of the left ventricular, basal-mid inferior wall. Moderately elevated pulmonary artery systolic pressure. LA was moderately dilated. Trivial MR.    Neuro/Psych  Headaches, PSYCHIATRIC DISORDERS Anxiety Depression  Neuromuscular disease (polyneuropathy) CVA, No Residual Symptoms    GI/Hepatic Neg liver ROS, GERD  Controlled,  Endo/Other  diabetes, Type 2, Insulin DependentHypothyroidism  Hyponatremia, Na 131 Hypocalcemia, Ca 8.2 Hypochloremia, Cl 93   Renal/GU ESRF and DialysisRenal disease     Musculoskeletal  (+) Arthritis ,   Abdominal   Peds   Hematology  (+) anemia ,  On plavix    Anesthesia Other Findings Covid test negative (Covid + 04/2019)   Reproductive/Obstetrics                           Anesthesia Physical Anesthesia Plan  ASA: IV  Anesthesia Plan: General   Post-op Pain Management:    Induction: Intravenous  PONV Risk Score and Plan: 2 and Treatment may vary due to age or medical condition, Ondansetron and Dexamethasone  Airway Management Planned: LMA  Additional Equipment: None  Intra-op Plan:   Post-operative Plan: Extubation in OR  Informed Consent: I have reviewed the patients History and Physical, chart, labs and discussed the procedure including the risks, benefits and alternatives for the proposed anesthesia with the patient or authorized representative who has indicated his/her understanding and acceptance.     Dental advisory given  Plan Discussed with: CRNA and Anesthesiologist  Anesthesia Plan Comments:       Anesthesia Quick Evaluation

## 2020-01-15 NOTE — Interval H&P Note (Signed)
History and Physical Interval Note:  01/15/2020 3:36 PM  James Chan  has presented today for surgery, with the diagnosis of VT.  The various methods of treatment have been discussed with the patient and family. After consideration of risks, benefits and other options for treatment, the patient has consented to  Procedure(s): SUBQ ICD IMPLANT (N/A) as a surgical intervention.  The patient's history has been reviewed, patient examined, no change in status, stable for surgery.  I have reviewed the patient's chart and labs.  Questions were answered to the patient's satisfaction.     James Chan
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
## 2020-01-15 NOTE — Transfer of Care (Signed)
Immediate Anesthesia Transfer of Care Note  Patient: James Chan  Procedure(s) Performed: SUBQ ICD IMPLANT (N/A )  Patient Location: PACU  Anesthesia Type:General  Level of Consciousness: awake, patient cooperative and confused  Airway & Oxygen Therapy: Patient Spontanous Breathing and Patient connected to nasal cannula oxygen  Post-op Assessment: Report given to RN and Post -op Vital signs reviewed and stable  Post vital signs: Reviewed and stable   Sinus Brady on monitor  Last Vitals:  Vitals Value Taken Time  BP 159/37 01/15/20 1820  Temp 36.5 C 01/15/20 1815  Pulse 30 01/15/20 1829  Resp 13 01/15/20 1829  SpO2 97 % 01/15/20 1829  Vitals shown include unvalidated device data.  Last Pain:  Vitals:   01/15/20 1618  TempSrc:   PainSc: Asleep         Complications: No complications documented.
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
## 2020-01-15 NOTE — Interval H&P Note (Signed)
History and Physical Interval Note:  01/15/2020 7:12 AM  James Chan  has presented today for surgery, with the diagnosis of cardiac arrest with ventricular tachycardia.  The various methods of treatment have been discussed with the patient and family. After consideration of risks, benefits and other options for treatment, the patient has consented to  Procedure(s): LEFT HEART CATH AND CORONARY ANGIOGRAPHY (N/A) as a surgical intervention.  The patient's history has been reviewed, patient examined, no change in status, stable for surgery.  I have reviewed the patient's chart and labs.  Questions were answered to the patient's satisfaction.    Cath Lab Visit (complete for each Cath Lab visit)  Clinical Evaluation Leading to the Procedure:   ACS: Yes.    Non-ACS:  N/A  Alyse Kathan

## 2020-01-15 NOTE — Progress Notes (Signed)
52fr sheath aspirated and removed from left femoral artery. Manual pressure applied for 20 minutes. Site level 0 no s+s of hematoma. tegaderm dressing applied, bedrest instructions given.   Bilateral dp and pt pulses present with doppler. Left pt faint, but present.   Bedrest begins at 09:30:00
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
## 2020-01-15 NOTE — Progress Notes (Signed)
Echocardiogram 2D Echocardiogram has been performed.  Oneal Deputy Dyesha Henault 01/15/2020, 2:19 PM

## 2020-01-16 ENCOUNTER — Encounter (HOSPITAL_COMMUNITY): Payer: Self-pay | Admitting: Internal Medicine

## 2020-01-16 DIAGNOSIS — I2510 Atherosclerotic heart disease of native coronary artery without angina pectoris: Secondary | ICD-10-CM

## 2020-01-16 DIAGNOSIS — N186 End stage renal disease: Secondary | ICD-10-CM

## 2020-01-16 LAB — CBC
HCT: 29.7 % — ABNORMAL LOW (ref 39.0–52.0)
Hemoglobin: 9.8 g/dL — ABNORMAL LOW (ref 13.0–17.0)
MCH: 33 pg (ref 26.0–34.0)
MCHC: 33 g/dL (ref 30.0–36.0)
MCV: 100 fL (ref 80.0–100.0)
Platelets: 260 10*3/uL (ref 150–400)
RBC: 2.97 MIL/uL — ABNORMAL LOW (ref 4.22–5.81)
RDW: 14.4 % (ref 11.5–15.5)
WBC: 10.4 10*3/uL (ref 4.0–10.5)
nRBC: 0 % (ref 0.0–0.2)

## 2020-01-16 LAB — BASIC METABOLIC PANEL
Anion gap: 14 (ref 5–15)
BUN: 26 mg/dL — ABNORMAL HIGH (ref 8–23)
CO2: 22 mmol/L (ref 22–32)
Calcium: 8.3 mg/dL — ABNORMAL LOW (ref 8.9–10.3)
Chloride: 95 mmol/L — ABNORMAL LOW (ref 98–111)
Creatinine, Ser: 4.99 mg/dL — ABNORMAL HIGH (ref 0.61–1.24)
GFR calc Af Amer: 13 mL/min — ABNORMAL LOW (ref >60)
GFR calc non Af Amer: 11 mL/min — ABNORMAL LOW (ref >60)
Glucose, Bld: 250 mg/dL — ABNORMAL HIGH (ref 70–99)
Potassium: 5.4 mmol/L — ABNORMAL HIGH (ref 3.5–5.1)
Sodium: 131 mmol/L — ABNORMAL LOW (ref 135–145)

## 2020-01-16 LAB — GLUCOSE, CAPILLARY
Glucose-Capillary: 264 mg/dL — ABNORMAL HIGH (ref 70–99)
Glucose-Capillary: 119 mg/dL — ABNORMAL HIGH (ref 70–99)
Glucose-Capillary: 102 mg/dL — ABNORMAL HIGH (ref 70–99)
Glucose-Capillary: 186 mg/dL — ABNORMAL HIGH (ref 70–99)

## 2020-01-16 MED ORDER — APIXABAN 5 MG PO TABS: 5.0000 mg | ORAL_TABLET | Freq: Two times a day (BID) | ORAL | Status: DC

## 2020-01-16 MED ORDER — AMIODARONE HCL 200 MG PO TABS
200.0000 mg | ORAL_TABLET | Freq: Two times a day (BID) | ORAL | Status: DC
  Administered 2020-01-16 – 2020-01-17 (×3): 200 mg via ORAL
  Filled 2020-01-16 (×3): qty 1

## 2020-01-16 MED ORDER — SODIUM CHLORIDE 0.9% FLUSH
3.0000 mL | Freq: Two times a day (BID) | INTRAVENOUS | Status: DC
  Administered 2020-01-16: 3 mL via INTRAVENOUS
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
## 2020-01-16 NOTE — Care Management (Signed)
1501 01-16-20 Previous Case Manager spoke with patient regarding transportation to HD. Patient states he may miss HD, because his brother may not be able to take him to treatments. Previous Case Manager provided pt with the [place] Number for transportation. Patient to call to see how many round-trip rides can be provided via Medicare. This Case Manager called Jaclyn Shaggy the Renal Navigator and she is also calling [employer] to start an application for [employer]. This may be a process and the patient may not have initial transportation once he gets home. Jaclyn Shaggy is making the HD center aware. Patient may need to take a cab to HD initially if this is an option in the county. Case Manager will continue to follow for additional transition of care needs. Bethena Roys, RN,BSN Case Manager

## 2020-01-16 NOTE — Anesthesia Postprocedure Evaluation (Signed)
Anesthesia Post Note  Patient: James Chan  Procedure(s) Performed: SUBQ ICD IMPLANT (N/A )     Patient location during evaluation: PACU Anesthesia Type: General Level of consciousness: sedated and patient cooperative Pain management: pain level controlled Vital Signs Assessment: post-procedure vital signs reviewed and stable Respiratory status: spontaneous breathing Cardiovascular status: stable Anesthetic complications: no   No complications documented.  Last Vitals:  Vitals:   01/16/20 1111 01/16/20 1137  BP: (!) 128/50   Pulse: (!) 51   Resp: 18   Temp: 36.6 C 36.6 C  SpO2: 98%     Last Pain:  Vitals:   01/16/20 1300  TempSrc:   PainSc: 0-No pain                 Nolon Nations
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
## 2020-01-16 NOTE — TOC Benefit Eligibility Note (Signed)
Transition of Care [facility]) Benefit Eligibility Note    Patient Details  Name: HENRIK ORIHUELA MRN: [id number] Date of Birth: 01/20/1951   Medication/Dose: Arne Cleveland  2.5 MG BID    and   ELIQUIS  5 MG BID  Covered?: Yes  Tier: 3 Drug  Prescription Coverage Preferred Pharmacy: [facility]   and  [facility]  Spoke with Person/Company/Phone Number:: JANE  @ [employer] # [phone]  Co-Pay: $ 47.00  Prior Approval: No  Deductible: Met       Memory Argue Phone Number: 01/16/2020, 2:49 PM

## 2020-01-16 NOTE — Progress Notes (Addendum)
Appreciate case management support in assessing cost of Eliquis  Reviewed cost ($47 per month) with patient, and dosing with pharmacy (5 mg BID given weight and age)  Pt states he will be able to afford it at this price point.   Per discussion with Dr. Burt Knack, pt will be OFF plavix moving forward.  James Chan [address]" [place], [place]  01/16/2020 3:59 PM   James Chan

## 2020-01-17 ENCOUNTER — Encounter (HOSPITAL_COMMUNITY): Payer: Self-pay | Admitting: Cardiovascular Disease

## 2020-01-17 DIAGNOSIS — I714 Abdominal aortic aneurysm, without rupture, unspecified: Secondary | ICD-10-CM

## 2020-01-17 DIAGNOSIS — Z95810 Presence of automatic (implantable) cardiac defibrillator: Secondary | ICD-10-CM | POA: Diagnosis not present

## 2020-01-17 DIAGNOSIS — I951 Orthostatic hypotension: Secondary | ICD-10-CM

## 2020-01-17 DIAGNOSIS — S2249XA Multiple fractures of ribs, unspecified side, initial encounter for closed fracture: Secondary | ICD-10-CM | POA: Diagnosis present

## 2020-01-17 DIAGNOSIS — D649 Anemia, unspecified: Secondary | ICD-10-CM | POA: Diagnosis present

## 2020-01-17 HISTORY — DX: Presence of automatic (implantable) cardiac defibrillator: Z95.810

## 2020-01-17 HISTORY — DX: Orthostatic hypotension: I95.1

## 2020-01-17 HISTORY — DX: Multiple fractures of ribs, unspecified side, initial encounter for closed fracture: S22.49XA

## 2020-01-17 HISTORY — DX: Abdominal aortic aneurysm, without rupture, unspecified: I71.40

## 2020-01-17 HISTORY — DX: Abdominal aortic aneurysm, without rupture: I71.4

## 2020-01-17 LAB — BASIC METABOLIC PANEL
Anion gap: 13 (ref 5–15)
BUN: 25 mg/dL — ABNORMAL HIGH (ref 8–23)
CO2: 28 mmol/L (ref 22–32)
Calcium: 8.7 mg/dL — ABNORMAL LOW (ref 8.9–10.3)
Chloride: 93 mmol/L — ABNORMAL LOW (ref 98–111)
Creatinine, Ser: 4.56 mg/dL — ABNORMAL HIGH (ref 0.61–1.24)
GFR calc Af Amer: 14 mL/min — ABNORMAL LOW (ref >60)
GFR calc non Af Amer: 12 mL/min — ABNORMAL LOW (ref >60)
Glucose, Bld: 158 mg/dL — ABNORMAL HIGH (ref 70–99)
Potassium: 4.3 mmol/L (ref 3.5–5.1)
Sodium: 134 mmol/L — ABNORMAL LOW (ref 135–145)

## 2020-01-17 LAB — CBC
HCT: 27.4 % — ABNORMAL LOW (ref 39.0–52.0)
Hemoglobin: 8.9 g/dL — ABNORMAL LOW (ref 13.0–17.0)
MCH: 32.8 pg (ref 26.0–34.0)
MCHC: 32.5 g/dL (ref 30.0–36.0)
MCV: 101.1 fL — ABNORMAL HIGH (ref 80.0–100.0)
Platelets: 229 10*3/uL (ref 150–400)
RBC: 2.71 MIL/uL — ABNORMAL LOW (ref 4.22–5.81)
RDW: 14.6 % (ref 11.5–15.5)
WBC: 9.9 10*3/uL (ref 4.0–10.5)
nRBC: 0 % (ref 0.0–0.2)

## 2020-01-17 LAB — GLUCOSE, CAPILLARY: Glucose-Capillary: 155 mg/dL — ABNORMAL HIGH (ref 70–99)

## 2020-01-17 MED ORDER — METOPROLOL TARTRATE 25 MG PO TABS: 12.5000 mg | ORAL_TABLET | Freq: Two times a day (BID) | ORAL | 6 refills | Status: DC

## 2020-01-17 MED ORDER — CALCITRIOL 0.25 MCG PO CAPS: 1.7500 ug | ORAL_CAPSULE | ORAL | 1 refills | Status: DC

## 2020-01-17 MED ORDER — ACETAMINOPHEN 325 MG PO TABS: 325.0000 mg | ORAL_TABLET | ORAL | Status: DC | PRN

## 2020-01-17 MED ORDER — METOPROLOL TARTRATE 12.5 MG HALF TABLET: 12.5000 mg | ORAL_TABLET | Freq: Two times a day (BID) | ORAL | Status: DC

## 2020-01-17 MED ORDER — SEVELAMER CARBONATE 800 MG PO TABS: 2400.0000 mg | ORAL_TABLET | Freq: Three times a day (TID) | ORAL | 1 refills | Status: DC

## 2020-01-17 MED ORDER — APIXABAN 5 MG PO TABS: 5.0000 mg | ORAL_TABLET | Freq: Two times a day (BID) | ORAL | 6 refills | Status: DC

## 2020-01-17 MED ORDER — AMIODARONE HCL 200 MG PO TABS: 200.0000 mg | ORAL_TABLET | Freq: Two times a day (BID) | ORAL | 6 refills | Status: DC

## 2020-01-17 MED ORDER — ASPIRIN 81 MG PO CHEW: 81.0000 mg | CHEWABLE_TABLET | Freq: Every day | ORAL | 3 refills | Status: DC

## 2020-01-17 NOTE — Discharge Instructions (Signed)
Subcutaneous Cardioverter Defibrillator Implantation, Care After  This sheet gives you information about how to care for yourself after your procedure. Your health care provider may also give you more specific instructions. If you have problems or questions, contact your health care provider.  What can I expect after the procedure? After the procedure, it is common to have:  Some pain. It may last a few days.  A slight bump under the skin where the subcutaneous implantation cardioverter defibrillator (S-ICD) is. You may be able to feel the device under the skin. This is normal.   Follow these instructions at home: Medicines  Take over-the-counter and prescription medicines only as told by your health care provider.   Incision care   1. Follow instructions from your health care provider about how to take care of your incision. Make sure you: ? Leave stitches (sutures), skin glue, or adhesive strips in place. These skin closures may need to stay in place for 2 weeks or longer. If adhesive strip edges start to loosen and curl up, you may trim the loose edges. Do not remove adhesive strips completely unless your health care provider tells you to do that. 2. Check your incision area every day for signs of infection. Check for: ? Redness, swelling, or pain. ? Fluid or blood. ? Warmth. ? Pus or a bad smell.  Activity  Do not lift anything that is heavier than 10 lb (4.5 kg), or the limit that you are told, until your health care provider says that it is safe.  Avoid sports and any other activity that could cause a hit to the generator or leads. Ask your health care provider what activities are safe for you and when you may return to your normal activities.  Follow instructions from your health care provider about exercise and sexual activity restrictions after your procedure.  Electric and magnetic fields 1. Tell all health care providers, including your dentist, that you have a  defibrillator. They need to know this so they do not give you an MRI scan, which uses strong magnets. 2. When using your cell phone, hold it to the ear that is on the opposite side from the defibrillator. Do not leave your cell phone in a pocket over the defibrillator. 3. If you must pass through a metal detector, quickly walk through it. Do not stop under the detector, and do not stand near it. 4. Avoid places or objects that have a strong electric or magnetic field, including: ? Airport Herbalist. At the airport, tell officials that you have a defibrillator. Your defibrillator ID card will let you be checked in a way that is safe for you and will not damage your defibrillator. Also, do not let a security person wave a magnetic wand near your defibrillator. That can make it stop working. ? Power plants. ? Large electrical generators. ? Anti-theft systems or electronic article surveillance (EAS). ? Radiofrequency transmission towers, such as cell phone and radio towers. 5. Do not use amateur (ham) radio equipment or electric (arc) welding torches. 6. Some devices are safe to use if they are held 12 inches (30 cm) or more away from your defibrillator. These include power tools, lawn mowers, and speakers. If you are not sure if something is safe to use, ask your health care provider. 7. Do not use MP3 player headphones. They have magnets. 8. You may safely use electric blankets, heating pads, computers, and microwave ovens. General instructions  Do not take baths, swim, shower, or  use a hot tub until your health care provider approves. You may need to take sponge baths until your health care provider says that you may bathe or shower.  Do not drive until your health care provider approves.  Always keep your defibrillator ID card with you. The card should list the implant date, device model, and manufacturer. Consider wearing a medical alert bracelet or necklace that says that you have an  S-ICD.  Do not use any products that contain nicotine or tobacco, such as cigarettes and e-cigarettes. If you need help quitting, ask your health care provider.  Have your defibrillator checked as often as told by your health care provider. Most S-ICDs last for 4-8 years before they need to be replaced.  Contact a health care provider if:  You feel one shock in your chest.  You gain weight suddenly.  You have a fever.  You have severe pain, and medicines do not help.  You have redness, swelling, or pain around your incision area.  You have pus or a bad smell coming from your incision area.  You have fluid or blood coming from your incision.  Your incision area feels warm to the touch.  Your heart feels like it is fluttering or skipping beats (heart palpitations).  You feel increased anxiety or depression.   Get help right away if:  You feel more than one shock.  You have chest pain.  You have problems breathing or have shortness of breath.  You have dizziness or fainting.   Summary  After the procedure, you may have some pain, see a bump under your skin, and feel the device under your skin.  Check your incision area every day for signs of infection.  Be careful around electric and magnetic fields.  Always keep your defibrillator ID card with you. You should receive this in 6-8 weeks   Begin Eliquis on 01/20/20  We stopped your PLAVIX and on the 28th you can also stop asprin.   Renal diet and diabetic   No driving for 6 months.

## 2020-01-17 NOTE — Discharge Summary (Addendum)
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 stenosis is present. Pulmonic Valve: The pulmonic valve was normal in structure. Pulmonic valve regurgitation is not visualized. No evidence of pulmonic stenosis. Aorta: The aortic root and ascending aorta are structurally normal, with no evidence of dilitation. IAS/Shunts: The atrial septum is grossly normal.  LEFT VENTRICLE PLAX 2D LVIDd:         5.50 cm  Diastology LVIDs:         5.10 cm  LV e' medial:    4.24 cm/s LV PW:         1.10 cm  LV E/e' medial:  22.6 LV IVS:        1.40 cm  LV e' lateral:   5.44 cm/s LVOT diam:     2.20 cm  LV E/e' lateral: 17.6 LV SV:         78 LV SV Index:   37 LVOT Area:     3.80 cm  RIGHT VENTRICLE RV S prime:     8.27 cm/s TAPSE (Chan-mode): 1.7 cm LEFT ATRIUM             Index       RIGHT ATRIUM           Index LA diam:        3.90 cm 1.85 cm/Chan  RA Area:     17.70 cm LA Vol (  A2C):   76.5 ml 36.23 ml/Chan RA Volume:   47.80 ml  22.64 ml/Chan LA Vol (A4C):   97.4 ml 46.13 ml/Chan LA Biplane Vol: 89.9 ml 42.58 ml/Chan  AORTIC VALVE LVOT Vmax:   71.10 cm/s LVOT Vmean:  53.200 cm/s LVOT VTI:    0.206 Chan  AORTA Ao Root diam: 3.80 cm MITRAL VALVE                TRICUSPID VALVE MV Area (PHT): 2.36 cm    TR Peak grad:   38.7 mmHg MV Decel Time: 322 msec    TR Vmax:        311.00 cm/s MV E velocity: 96.00 cm/s MV A velocity: 55.30 cm/s  SHUNTS MV E/A ratio:  1.74        Systemic VTI:  0.21 Chan                            Systemic Diam: 2.20 cm Mertie Moores MD Electronically signed by Mertie Moores MD Signature Date/Time: 01/15/2020/2:36:10 PM    Final    VAS [place] LOWER EXTREMITY ARTERIAL DUPLEX  Result Date: 01/06/2020 LOWER EXTREMITY ARTERIAL DUPLEX STUDY Indications: PVD (peripheral vascular disease) ([place]) [I73.9 (ICD-10-CM)]. High Risk Factors: Hypertension, hyperlipidemia, Diabetes, prior MI, coronary                    artery disease. Other Factors: Arterial insufficiency, s/p Lower extremity arterial stent                placement.  Current ABI: No prior Performing Technologist: Jeneen Rinks Reel RDCS  Examination Guidelines: A complete evaluation includes B-mode imaging, spectral Doppler, color Doppler, and power Doppler as needed of all accessible portions of each vessel. Bilateral testing is considered an integral part of a complete examination. Limited examinations for reoccurring indications may be performed as noted.  +----------+--------+-----+---------------+----------+-----------+ RIGHT     PSV cm/sRatioStenosis       Waveform  Comments    +----------+--------+-----+---------------+----------+-----------+ EIA Distal143          50-74% stenosisbiphasic  upper range +----------+--------+-----+---------------+----------+-----------+ CFA Distal137                         biphasic              +----------+--------+-----+---------------+----------+-----------+ DFA       601          75-99% stenosismonophasic            +----------+--------+-----+---------------+----------+-----------+ SFA Prox  246          50-74% stenosisbiphasic              +----------+--------+-----+---------------+----------+-----------+ SFA Mid   220           50-74% stenosisbiphasic              +----------+--------+-----+---------------+----------+-----------+ SFA Distal130                         biphasic              +----------+--------+-----+---------------+----------+-----------+ POP Prox  178          30-49% stenosisbiphasic              +----------+--------+-----+---------------+----------+-----------+ ATA Mid   56                          biphasic              +----------+--------+-----+---------------+----------+-----------+  PTA Mid   76                          biphasic              +----------+--------+-----+---------------+----------+-----------+ PERO Mid  91                          biphasic              +----------+--------+-----+---------------+----------+-----------+ A focal velocity elevation of was obtained at deep femoral artery. Findings are characteristic of 75-99% stenosis. A 2nd focal velocity elevation was visualized, measuring at superficial femoral artery. Findings are characteristic of 50-74% stenosis. A 3rd focal velocity elevation was visualized, measuring at popliteal artery. Findings are characteristic of 30-49% stenosis.  +----------+--------+-----+---------------+--------+-----------+ LEFT      PSV cm/sRatioStenosis       WaveformComments    +----------+--------+-----+---------------+--------+-----------+ EIA Distal156          30-49% stenosisbiphasiclower range +----------+--------+-----+---------------+--------+-----------+ CFA Distal121                         biphasic            +----------+--------+-----+---------------+--------+-----------+ DFA       161          30-49% stenosisbiphasic            +----------+--------+-----+---------------+--------+-----------+ SFA Prox  107                         biphasic            +----------+--------+-----+---------------+--------+-----------+ SFA Mid   158          30-49% stenosisbiphasic             +----------+--------+-----+---------------+--------+-----------+ SFA Distal306          50-74% stenosisbiphasic            +----------+--------+-----+---------------+--------+-----------+ POP Prox  65                          biphasic            +----------+--------+-----+---------------+--------+-----------+ POP Distal57                          biphasic            +----------+--------+-----+---------------+--------+-----------+ ATA Mid   55                          biphasic            +----------+--------+-----+---------------+--------+-----------+ PTA Mid   45                          biphasic            +----------+--------+-----+---------------+--------+-----------+ PERO Mid  98                          biphasic            +----------+--------+-----+---------------+--------+-----------+ A focal velocity elevation of was obtained at deep femoral artery. Findings are characteristic of 30-49% stenosis. A 2nd focal velocity elevation was visualized, measuring at superficial femoral artery. Findings are characteristic of 30-49% stenosis. A 3rd focal velocity elevation was visualized, measuring at popliteal artery. Findings are  characteristic of 50-74% stenosis.  Summary: Right: 75-99% stenosis noted in the deep femoral artery. 50-74% stenosis noted in the superficial femoral artery. 30-49% stenosis noted in the popliteal artery. Left: 30-49% stenosis noted in the iliac segment. 30-49% stenosis noted in the deep femoral artery. 30-49% stenosis noted in the superficial femoral artery. 50-74% stenosis noted in the popliteal artery.  See table(s) above for measurements and observations. Electronically signed by Shirlee More MD on 01/06/2020 at 4:58:11 PM.    Final    Disposition   Pt is being discharged home today in good condition.  Follow-up Plans & Appointments  Begin Eliquis on 01/20/20  We stopped your PLAVIX and on the 28th you can also stop asprin.   Renal diet and  diabetic  No driving for 6 months.    Follow-up Information     Evans Lance, MD Follow up on 04/27/2020.   Specialty: Cardiology Why: at 345 for 3 month S-ICD check  Contact information: [address] [place] [address] [phone]         [facility] Follow up on 01/29/2020.   Why: at 1130 for S-ICD wound check.  Contact information: [place] [address] [phone]        Richardo Priest, MD Follow up.   Specialty: Cardiology Why: you should be seen in 7-14 days the office will call.   Contact information: [place] [address] [phone]                   Discharge Medications   Allergies as of 01/17/2020   No Known Allergies      Medication List     STOP taking these medications    clopidogrel 75 MG tablet Commonly known as: PLAVIX       TAKE these medications    Accu-Chek Aviva Plus test strip Generic drug: glucose blood daily.   Accu-Chek Softclix Lancets lancets USE AS DIRECTED DAILY AS NEEDED   acetaminophen 325 MG tablet Commonly known as: TYLENOL Take 1-2 tablets (325-650 mg total) by mouth every 4 (four) hours as needed for mild pain.   allopurinol 100 MG tablet Commonly known as: ZYLOPRIM Take 100 mg by mouth at bedtime.   amiodarone 200 MG tablet Commonly known as: PACERONE Take 1 tablet (200 mg total) by mouth 2 (two) times daily.   apixaban 5 MG Tabs tablet Commonly known as: ELIQUIS Take 1 tablet (5 mg total) by mouth 2 (two) times daily. Start taking on: January 20, 2020   aspirin 81 MG chewable tablet Chew 1 tablet (81 mg total) by mouth daily. Stop on 01/20/20 What changed: additional instructions   atorvastatin 40 MG tablet Commonly known as: LIPITOR Take 1 tablet (40 mg total) by mouth every evening.   calcitRIOL 0.25 MCG capsule Commonly known as: ROCALTROL Take 7  capsules (1.75 mcg total) by mouth every Monday, Wednesday, and Friday with hemodialysis. Start taking on: January 19, 2020   hydrALAZINE 25 MG tablet Commonly known as: APRESOLINE Take 25 mg by mouth 3 (three) times daily.   latanoprost 0.005 % ophthalmic solution Commonly known as: XALATAN Place 1 drop into both eyes at bedtime.   lidocaine-prilocaine cream Commonly known as: EMLA Apply 1 application topically See admin instructions. Apply small amount to access site 1 to 2 hours before dialysis then cover with saran wrap.   metoprolol tartrate 25 MG tablet Commonly known as: [employer]  Take 0.5 tablets (12.5 mg total) by mouth 2 (two) times daily. What changed: how much to take   midodrine 5 MG tablet Commonly known as: PROAMATINE Take 5 mg by mouth See admin instructions. Take 1 tablet (5mg  totally) by mouth 3 times a week prior dialysis   MIRCERA IJ Mircera   nitroGLYCERIN 0.4 MG SL tablet Commonly known as: NITROSTAT Place 1 tablet (0.4 mg total) under the tongue every 5 (five) minutes as needed for chest pain.   NovoTwist 32G X 5 MM Misc Generic drug: Insulin Pen Needle   oxyCODONE-acetaminophen 5-325 MG tablet Commonly known as: PERCOCET/ROXICET Take 1 tablet by mouth every 4 (four) hours as needed for severe pain.   polyethylene glycol 17 g packet Commonly known as: MIRALAX / GLYCOLAX Take 17 g by mouth daily as needed for mild constipation.   ProAir HFA 108 (90 Base) MCG/ACT inhaler Generic drug: albuterol Inhale 2 puffs into the lungs every 6 (six) hours as needed for wheezing or shortness of breath.   promethazine 25 MG tablet Commonly known as: PHENERGAN Take 25 mg by mouth every 6 (six) hours as needed for nausea or vomiting.   sevelamer carbonate 800 MG tablet Commonly known as: RENVELA Take 3 tablets (2,400 mg total) by mouth 3 (three) times daily with meals.           Outstanding Labs/Studies   BMP,. CBC   Duration of Discharge  Encounter   Greater than 30 minutes including physician time.  Signed, Cecilie Kicks, NP 01/17/2020, 12:06 PM  EP Attending  Patient seen and examined. Agree with above. See my note as well. He is stable for DC with usual followup.  Carleene Overlie Milfred Krammes,MD
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
## 2020-01-17 NOTE — Progress Notes (Signed)
Progress Note  Patient Name: James Chan Date of Encounter: 01/17/2020  Primary Cardiologist: Shirlee More, MD   Subjective   No chest pain or sob.   Inpatient Medications    Scheduled Meds: . allopurinol  100 mg Oral QHS  . amiodarone  200 mg Oral BID  . [START ON 01/20/2020] apixaban  5 mg Oral BID  . aspirin EC  81 mg Oral Daily  . atorvastatin  40 mg Oral QPM  . calcitRIOL  1.75 mcg Oral Q M,W,F-HD  . Chlorhexidine Gluconate Cloth  6 each Topical Daily  . feeding supplement (PROSource TF)  45 mL Per Tube BID  . hydrALAZINE  25 mg Oral TID  . insulin aspart  0-15 Units Subcutaneous TID WC  . insulin aspart  0-5 Units Subcutaneous QHS  . metoprolol tartrate  37.5 mg Oral BID  . midodrine  5 mg Oral Q M,W,F-HD  . multivitamin  1 tablet Oral QHS  . sevelamer carbonate  2,400 mg Oral TID WC  . sodium chloride flush  3 mL Intravenous Q12H   Continuous Infusions: . sodium chloride 10 mL/hr at 01/15/20 0616  . sodium chloride    . sodium chloride     PRN Meds: sodium chloride, sodium chloride, acetaminophen, albuterol, ALPRAZolam, alteplase, heparin, hydrALAZINE, iohexol, lidocaine (PF), lidocaine-prilocaine, nitroGLYCERIN, ondansetron (ZOFRAN) IV, oxyCODONE-acetaminophen, pentafluoroprop-tetrafluoroeth, promethazine, zolpidem   Vital Signs    Vitals:   01/17/20 0039 01/17/20 0400 01/17/20 0700 01/17/20 1001  BP: (!) 121/48 (!) 131/53 (!) 142/54 (!) 157/59  Pulse: (!) 57 (!) 53 (!) 54   Resp: 18 20 16    Temp: 98 F (36.7 C) 98.1 F (36.7 C) 97.6 F (36.4 C)   TempSrc: Oral Oral Oral   SpO2: 98% 99% 99%   Weight:      Height:        Intake/Output Summary (Last 24 hours) at 01/17/2020 1010 Last data filed at 01/16/2020 2000 Gross per 24 hour  Intake 742.44 ml  Output 2500 ml  Net -1757.56 ml   Filed Weights   01/16/20 0415 01/16/20 0730 01/16/20 1111  Weight: 90 kg 90.3 kg 87.2 kg    Telemetry    Sinus brady - Personally Reviewed  ECG    none -  Personally Reviewed  Physical Exam   GEN: No acute distress.   Neck: No JVD Cardiac: RRR, no murmurs, rubs, or gallops.  Respiratory: Clear to auscultation bilaterally. Dried blood over superior sternal incision. GI: Soft, nontender, non-distended  MS: No edema; No deformity. Neuro:  Nonfocal  Psych: Normal affect   Labs    Chemistry Recent Labs  Lab 01/14/20 1305 01/14/20 1317 01/15/20 1529 01/16/20 0349 01/17/20 0641  NA 135   < > 133* 131* 134*  K 3.2*   < > 4.4 5.4* 4.3  CL 95*   < > 95* 95* 93*  CO2 27   < > 25 22 28   GLUCOSE 207*   < > 119* 250* 158*  BUN 9   < > 21 26* 25*  CREATININE 2.78*   < > 4.51* 4.99* 4.56*  CALCIUM 8.1*   < > 8.3* 8.3* 8.7*  PROT 6.4*  --   --   --   --   ALBUMIN 3.2*  --  3.1*  --   --   AST 18  --   --   --   --   ALT 12  --   --   --   --  ALKPHOS 56  --   --   --   --   BILITOT 1.1  --   --   --   --   GFRNONAA 23*   < > 13* 11* 12*  GFRAA 26*   < > 15* 13* 14*  ANIONGAP 13   < > 13 14 13    < > = values in this interval not displayed.     Hematology Recent Labs  Lab 01/15/20 1529 01/16/20 0349 01/17/20 0641  WBC 7.0 10.4 9.9  RBC 2.76* 2.97* 2.71*  HGB 8.8* 9.8* 8.9*  HCT 27.2* 29.7* 27.4*  MCV 98.6 100.0 101.1*  MCH 31.9 33.0 32.8  MCHC 32.4 33.0 32.5  RDW 14.3 14.4 14.6  PLT 232 260 229    Cardiac EnzymesNo results for input(s): TROPONINI in the last 168 hours. No results for input(s): TROPIPOC in the last 168 hours.   BNP Recent Labs  Lab 01/14/20 1803  BNP 1,469.3*     DDimer No results for input(s): DDIMER in the last 168 hours.   Radiology    EP PPM/ICD IMPLANT  Result Date: 01/15/2020 Conclusion: Successful implantation of a [employer] subcutaneous ICD in a patient with end-stage renal disease on hemodialysis who was resuscitated from a ventricular fibrillation cardiac arrest. Cristopher Peru, MD  ECHOCARDIOGRAM COMPLETE  Result Date: 01/15/2020    ECHOCARDIOGRAM REPORT   Patient Name:    James Chan Date of Exam: 01/15/2020 Medical Rec #:  [id number]        Height:       72.0 in Accession #:    [id number]       Weight:       195.8 lb Date of Birth:  03/01/1951       BSA:          2.111 m Patient Age:    68 years         BP:           154/54 mmHg Patient Gender: M                HR:           48 bpm. Exam Location:  Inpatient Procedure: 2D Echo, Color Doppler, Cardiac Doppler and Intracardiac            Opacification Agent Indications:    Cardiac Arrest i46.9;  History:        Patient has prior history of Echocardiogram examinations, most                 recent 04/23/2019. CAD; Risk Factors:Hypertension, Diabetes,                 Dyslipidemia and Sleep Apnea.  Sonographer:    Raquel Sarna Senior RDCS Referring Phys: [id number] [place]  Sonographer Comments: Technically difficult due to poor echo windows; unable to turn due to recent cath. IMPRESSIONS  1. Left ventricular ejection fraction, by estimation, is 40 to 45%. The left ventricle has mildly decreased function. The left ventricle demonstrates regional wall motion abnormalities (see scoring diagram/findings for description). The left ventricular  internal cavity size was mildly to moderately dilated. There is moderate asymmetric left ventricular hypertrophy. Left ventricular diastolic parameters are consistent with Grade II diastolic dysfunction (pseudonormalization). There is akinesis of the left ventricular, basal-mid inferior wall.  2. Right ventricular systolic function is normal. The right ventricular size is normal. There is moderately elevated pulmonary artery systolic pressure. The estimated right ventricular systolic pressure is  46.7 mmHg.  3. Left atrial size was moderately dilated.  4. The mitral valve is grossly normal. Trivial mitral valve regurgitation. No evidence of mitral stenosis.  5. The aortic valve is tricuspid. There is mild calcification of the aortic valve. There is mild thickening of the aortic valve. Aortic valve  regurgitation is not visualized. No aortic stenosis is present. FINDINGS  Left Ventricle: Left ventricular ejection fraction, by estimation, is 40 to 45%. The left ventricle has mildly decreased function. The left ventricle demonstrates regional wall motion abnormalities. The left ventricular internal cavity size was mildly to moderately dilated. There is moderate asymmetric left ventricular hypertrophy. Left ventricular diastolic parameters are consistent with Grade II diastolic dysfunction (pseudonormalization). Right Ventricle: The right ventricular size is normal. No increase in right ventricular wall thickness. Right ventricular systolic function is normal. There is moderately elevated pulmonary artery systolic pressure. The tricuspid regurgitant velocity is 3.11 m/s, and with an assumed right atrial pressure of 8 mmHg, the estimated right ventricular systolic pressure is 35.5 mmHg. Left Atrium: Left atrial size was moderately dilated. Right Atrium: Right atrial size was normal in size. Pericardium: There is no evidence of pericardial effusion. Mitral Valve: The mitral valve is grossly normal. Trivial mitral valve regurgitation. No evidence of mitral valve stenosis. Tricuspid Valve: The tricuspid valve is grossly normal. Tricuspid valve regurgitation is mild. Aortic Valve: The aortic valve is tricuspid. There is mild calcification of the aortic valve. There is mild thickening of the aortic valve. Aortic valve regurgitation is not visualized. No aortic stenosis is present. Pulmonic Valve: The pulmonic valve was normal in structure. Pulmonic valve regurgitation is not visualized. No evidence of pulmonic stenosis. Aorta: The aortic root and ascending aorta are structurally normal, with no evidence of dilitation. IAS/Shunts: The atrial septum is grossly normal.  LEFT VENTRICLE PLAX 2D LVIDd:         5.50 cm  Diastology LVIDs:         5.10 cm  LV e' medial:    4.24 cm/s LV PW:         1.10 cm  LV E/e' medial:  22.6  LV IVS:        1.40 cm  LV e' lateral:   5.44 cm/s LVOT diam:     2.20 cm  LV E/e' lateral: 17.6 LV SV:         78 LV SV Index:   37 LVOT Area:     3.80 cm  RIGHT VENTRICLE RV S prime:     8.27 cm/s TAPSE (M-mode): 1.7 cm LEFT ATRIUM             Index       RIGHT ATRIUM           Index LA diam:        3.90 cm 1.85 cm/m  RA Area:     17.70 cm LA Vol (A2C):   76.5 ml 36.23 ml/m RA Volume:   47.80 ml  22.64 ml/m LA Vol (A4C):   97.4 ml 46.13 ml/m LA Biplane Vol: 89.9 ml 42.58 ml/m  AORTIC VALVE LVOT Vmax:   71.10 cm/s LVOT Vmean:  53.200 cm/s LVOT VTI:    0.206 m  AORTA Ao Root diam: 3.80 cm MITRAL VALVE               TRICUSPID VALVE MV Area (PHT): 2.36 cm    TR Peak grad:   38.7 mmHg MV Decel Time: 322 msec    TR Vmax:  311.00 cm/s MV E velocity: 96.00 cm/s MV A velocity: 55.30 cm/s  SHUNTS MV E/A ratio:  1.74        Systemic VTI:  0.21 m                            Systemic Diam: 2.20 cm Mertie Moores MD Electronically signed by Mertie Moores MD Signature Date/Time: 01/15/2020/2:36:10 PM    Final     Cardiac Studies   none  Patient Profile     69 y.o. male admitted with a VF arrest in the setting of ESRD on HD  Assessment & Plan    1. VF arrest - he has done well on amiodarone. 2. S-ICD - minimal bleeding. Bandages removed. He is stable for DC. 3. Coags - restart Eliquis as above on 01/20/20. 4. Disp. - Ok for DC.  For questions or updates, please contact [employer] Please consult [email or web address] for contact info under Cardiology/STEMI.   Signed, Cristopher Peru, MD  01/17/2020, 10:10 AM  Patient ID: Archie Patten, male   DOB: 1951-02-13, 69 y.o.   MRN: [id number]

## 2020-01-18 ENCOUNTER — Telehealth: Payer: Self-pay | Admitting: Nephrology
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
## 2020-01-19 ENCOUNTER — Other Ambulatory Visit: Payer: Self-pay

## 2020-01-19 ENCOUNTER — Ambulatory Visit: Payer: Medicare Other | Admitting: Surgery

## 2020-01-19 ENCOUNTER — Telehealth: Payer: Self-pay | Admitting: Cardiology

## 2020-01-19 DIAGNOSIS — I469 Cardiac arrest, cause unspecified: Secondary | ICD-10-CM

## 2020-01-19 NOTE — Telephone Encounter (Signed)
Pt c/o medication issue:  1. Name of Medication: calcitRIOL (ROCALTROL) 0.25 MCG capsule  2. How are you currently taking this medication (dosage and times per day)? n/a  3. Are you having a reaction (difficulty breathing--STAT)? no  4. What is your medication issue? Pharmacist just needs clarification on this medication.

## 2020-01-19 NOTE — Telephone Encounter (Signed)
Returned call to [employer].  They needed clarificiaton on the Calcitrol, which was written from the hospital, stating to take 7 tablets on Monday, Wednesday, & Fridays with #12.. Couldn't clarify with the prescriber as she was out of the office today and tomorrow.  Sent to Dr. Bettina Gavia and his RN, Lilia Pro, and Dr. Bettina Gavia declined to comment as he isn't the prescriber and pt does follow a nephrologists.    Approved to change # to 21 with 1 refill to get pt through the week and will send phone note to prescriber, Cecilie Kicks, NP for review when she returns.
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
## 2020-01-19 NOTE — Telephone Encounter (Signed)
He has end-stage kidney disease on hemodialysis and this is prescribed by nephrologist not by me

## 2020-01-20 ENCOUNTER — Telehealth: Payer: Self-pay | Admitting: Internal Medicine

## 2020-01-20 ENCOUNTER — Other Ambulatory Visit: Payer: Self-pay

## 2020-01-20 NOTE — Patient Outreach (Signed)
[facility]) Care Management  01/20/2020  KEVIS QU 03/04/1951 [id number]   Late entry:  Voicemail received from the patient on 01/19/20 4:53 pm and returned call, HIPPA verified.  Patient states he was call back regarding a monitoring system that the cardiologist wanted set up but they did not have internet or home phone. Patient states, "a guy dropped it off and said it was from my heart doctor."   Asked if he could let this writer know the name of the system. He states the name of the device was "[employer]icator and it suppose to send information back to Dr. Cristopher Peru." 0930:  Call placed to Dr. Hillery Jacks office and spoke with Saddie Benders regarding the patient's issue and need. She states she would send information up for a return call.   1039:  Received a call back from [place] stating that the patient did not need internet, the the communicator should have an adapter to the patient's cell phone. 10:47 am  Call placed to patient at [phone] and was able to leave a generic voicemail message.  Plan:  Patient has been assigned to a [facility] Management Coordinator and will follow up with new information. Will await a return call from patient as well.  For questions, please contact:  Natividad Brood, RN BSN [facility] Liaison  [phone] business mobile phone Toll free office [phone]  Fax number: [phone] [email or web address]

## 2020-01-20 NOTE — Telephone Encounter (Signed)
[place] with [facility] is requesting to speak with Dr. Tanna Furry nurse. She would like to make Dr. Lovena Le aware that the patient does not have a land line or internet access at home and he will not be able to utilize at-home monitor. Please return call to [place] to further discuss at [phone].

## 2020-01-20 NOTE — Telephone Encounter (Signed)
Confirmed [employer] that the [employer] has a cellular adapter attached to it and no need for internet or landline.   Discussed with [name]. She will let pt know and If he has any other questions or issues can call the device clinic back at [phone] to troubleshoot.   [name] [address] [place], PA-C  01/20/2020 10:41 AM

## 2020-01-21 ENCOUNTER — Other Ambulatory Visit: Payer: Self-pay | Admitting: *Deleted

## 2020-01-21 ENCOUNTER — Encounter: Payer: Self-pay | Admitting: *Deleted

## 2020-01-21 NOTE — Telephone Encounter (Signed)
This should go to his nephrologist.  They ordered in hospital.
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
## 2020-01-22 ENCOUNTER — Encounter: Payer: Self-pay | Admitting: *Deleted

## 2020-01-22 ENCOUNTER — Other Ambulatory Visit: Payer: Self-pay | Admitting: *Deleted

## 2020-01-22 DIAGNOSIS — N186 End stage renal disease: Secondary | ICD-10-CM | POA: Diagnosis not present

## 2020-01-22 DIAGNOSIS — Z992 Dependence on renal dialysis: Secondary | ICD-10-CM | POA: Diagnosis not present

## 2020-01-22 DIAGNOSIS — E1122 Type 2 diabetes mellitus with diabetic chronic kidney disease: Secondary | ICD-10-CM | POA: Diagnosis not present
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
## 2020-01-23 DIAGNOSIS — E1129 Type 2 diabetes mellitus with other diabetic kidney complication: Secondary | ICD-10-CM | POA: Diagnosis not present

## 2020-01-23 DIAGNOSIS — Z992 Dependence on renal dialysis: Secondary | ICD-10-CM | POA: Diagnosis not present

## 2020-01-23 DIAGNOSIS — N2581 Secondary hyperparathyroidism of renal origin: Secondary | ICD-10-CM | POA: Diagnosis not present

## 2020-01-23 DIAGNOSIS — D509 Iron deficiency anemia, unspecified: Secondary | ICD-10-CM | POA: Diagnosis not present

## 2020-01-23 DIAGNOSIS — N186 End stage renal disease: Secondary | ICD-10-CM | POA: Diagnosis not present

## 2020-01-23 DIAGNOSIS — D631 Anemia in chronic kidney disease: Secondary | ICD-10-CM | POA: Diagnosis not present

## 2020-01-23 DIAGNOSIS — D689 Coagulation defect, unspecified: Secondary | ICD-10-CM | POA: Diagnosis not present

## 2020-01-26 ENCOUNTER — Encounter: Payer: Self-pay | Admitting: *Deleted

## 2020-01-26 ENCOUNTER — Other Ambulatory Visit: Payer: Self-pay | Admitting: *Deleted

## 2020-01-26 DIAGNOSIS — D509 Iron deficiency anemia, unspecified: Secondary | ICD-10-CM | POA: Diagnosis not present

## 2020-01-26 DIAGNOSIS — D631 Anemia in chronic kidney disease: Secondary | ICD-10-CM | POA: Diagnosis not present

## 2020-01-26 DIAGNOSIS — E1129 Type 2 diabetes mellitus with other diabetic kidney complication: Secondary | ICD-10-CM | POA: Diagnosis not present

## 2020-01-26 DIAGNOSIS — Z992 Dependence on renal dialysis: Secondary | ICD-10-CM | POA: Diagnosis not present

## 2020-01-26 DIAGNOSIS — D689 Coagulation defect, unspecified: Secondary | ICD-10-CM | POA: Diagnosis not present

## 2020-01-26 DIAGNOSIS — N2581 Secondary hyperparathyroidism of renal origin: Secondary | ICD-10-CM | POA: Diagnosis not present

## 2020-01-26 DIAGNOSIS — N186 End stage renal disease: Secondary | ICD-10-CM | POA: Diagnosis not present
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
## 2020-01-27 ENCOUNTER — Telehealth: Payer: Self-pay

## 2020-01-27 NOTE — Telephone Encounter (Signed)
I explained the importance for the wound check appointment and the pt still refuse to come on Thursday. I rescheduled the appointment with the help of Leigh and [place].

## 2020-01-27 NOTE — H&P (Signed)
ICD Criteria  Current LVEF:45%. Within 12 months prior to implant: Yes   Heart failure history: Yes, Class II  Cardiomyopathy history: Yes, Ischemic Cardiomyopathy - Prior MI.  Atrial Fibrillation/Atrial Flutter: No.  Ventricular tachycardia history: Yes, Hemodynamic instability present. 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  Cardiac arrest history: Yes, Ventricular Tachycardia.  History of syndromes with risk of sudden death: No.  Previous ICD: No.  Current ICD indication: Secondary  PPM indication: No.  Class I or II Bradycardia indication present: No  Beta Blocker therapy for 3 or more months: Yes, prescribed.   Ace Inhibitor/ARB therapy for 3 or more months: No, medical reason.   I have seen James Chan is a 70 y.o. malepre-procedural and has been referred by Dr. Burt Knack for consideration of ICD implant for ICD prevention of sudden death.  The patient's chart has been reviewed and they meet criteria for ICD implant.  I have had a thorough discussion with the patient reviewing options.  The patient and their family (if available) have had opportunities to ask questions and have them answered. The patient and I have decided together through the [employer] Support Tool to proceed with ICD at this time.  Risks, benefits, alternatives to ICD implantation were discussed in detail with the patient today. The patient  understands that the risks include but are not limited to bleeding, infection, pneumothorax, perforation, tamponade, vascular damage, renal failure, MI, stroke, death, inappropriate shocks, and lead dislodgement and he wishes to proceed.

## 2020-01-28 DIAGNOSIS — Z992 Dependence on renal dialysis: Secondary | ICD-10-CM | POA: Diagnosis not present

## 2020-01-28 DIAGNOSIS — D631 Anemia in chronic kidney disease: Secondary | ICD-10-CM | POA: Diagnosis not present

## 2020-01-28 DIAGNOSIS — E1129 Type 2 diabetes mellitus with other diabetic kidney complication: Secondary | ICD-10-CM | POA: Diagnosis not present

## 2020-01-28 DIAGNOSIS — N2581 Secondary hyperparathyroidism of renal origin: Secondary | ICD-10-CM | POA: Diagnosis not present

## 2020-01-28 DIAGNOSIS — D689 Coagulation defect, unspecified: Secondary | ICD-10-CM | POA: Diagnosis not present

## 2020-01-28 DIAGNOSIS — D509 Iron deficiency anemia, unspecified: Secondary | ICD-10-CM | POA: Diagnosis not present

## 2020-01-28 DIAGNOSIS — N186 End stage renal disease: Secondary | ICD-10-CM | POA: Diagnosis not present

## 2020-01-29 ENCOUNTER — Other Ambulatory Visit: Payer: Self-pay

## 2020-01-29 ENCOUNTER — Ambulatory Visit (INDEPENDENT_AMBULATORY_CARE_PROVIDER_SITE_OTHER): Payer: Medicare Other | Admitting: Emergency Medicine

## 2020-01-29 ENCOUNTER — Ambulatory Visit: Payer: Medicare Other

## 2020-01-29 DIAGNOSIS — I469 Cardiac arrest, cause unspecified: Secondary | ICD-10-CM

## 2020-01-29 LAB — CUP PACEART INCLINIC DEVICE CHECK
Date Time Interrogation Session: 20211007115126
Implantable Lead Implant Date: 20210923
Implantable Lead Location: 753862
Implantable Lead Model: 3501
Implantable Lead Serial Number: 194255
Implantable Pulse Generator Implant Date: 20210923
Pulse Gen Serial Number: 140488

## 2020-01-29 NOTE — Patient Instructions (Addendum)
ICD education packet provided. Please use ice to area that has swelling. 15-20 minutes, three times a day. Make sure there is a barrier between your skin and ice pack. Hold Eliquis until you see Dr. Lovena Le at your next visit. Please call if you have any increased pain, swelling, fever or chills.

## 2020-01-29 NOTE — Progress Notes (Signed)
Wound check appointment. Steri-strips removed. Wound without redness. Swelling noted to skin lateral to SICD incision into back. Soft and firmness noted towards center of swelling. Patient reports of tenderness to area. Consulted Dr. Lovena Le. Verbal order obtained for patient to hold Eliquis until he is seen at next OV 02/03/20. Dr. Lovena Le would like for patient to come in device clinic for wound check and see him then. Incision edges approximated, wound well healed. Normal device function. Subcutaneous ICD check in clinic. 0 untreated episodes; 0 treated episodes; 0 shocks delivered. Electrode impedance status okay. No programming changes. Remaining longevity to ERI 100%.   See scanned report.  ICD education packet provided. Please use ice to area that has swelling. 15-20 minutes, three times a day. Make sure there is a barrier between your skin and ice pack. Hold Eliquis until you see Dr. Lovena Le at your next visit. Please call if you have any increased pain, swelling, fever or chills.

## 2020-01-30 ENCOUNTER — Ambulatory Visit (INDEPENDENT_AMBULATORY_CARE_PROVIDER_SITE_OTHER): Payer: Medicare Other

## 2020-01-30 ENCOUNTER — Ambulatory Visit (INDEPENDENT_AMBULATORY_CARE_PROVIDER_SITE_OTHER): Payer: Medicare Other | Admitting: Cardiology

## 2020-01-30 ENCOUNTER — Encounter: Payer: Self-pay | Admitting: Cardiology

## 2020-01-30 VITALS — BP 130/50 | HR 54 | Ht 72.0 in | Wt 191.0 lb

## 2020-01-30 DIAGNOSIS — I493 Ventricular premature depolarization: Secondary | ICD-10-CM

## 2020-01-30 DIAGNOSIS — D631 Anemia in chronic kidney disease: Secondary | ICD-10-CM | POA: Diagnosis not present

## 2020-01-30 DIAGNOSIS — E1129 Type 2 diabetes mellitus with other diabetic kidney complication: Secondary | ICD-10-CM | POA: Diagnosis not present

## 2020-01-30 DIAGNOSIS — I4901 Ventricular fibrillation: Secondary | ICD-10-CM | POA: Insufficient documentation

## 2020-01-30 DIAGNOSIS — I110 Hypertensive heart disease with heart failure: Secondary | ICD-10-CM

## 2020-01-30 DIAGNOSIS — I5042 Chronic combined systolic (congestive) and diastolic (congestive) heart failure: Secondary | ICD-10-CM | POA: Diagnosis not present

## 2020-01-30 DIAGNOSIS — R5383 Other fatigue: Secondary | ICD-10-CM | POA: Insufficient documentation

## 2020-01-30 DIAGNOSIS — R42 Dizziness and giddiness: Secondary | ICD-10-CM | POA: Insufficient documentation

## 2020-01-30 DIAGNOSIS — D509 Iron deficiency anemia, unspecified: Secondary | ICD-10-CM | POA: Diagnosis not present

## 2020-01-30 DIAGNOSIS — D689 Coagulation defect, unspecified: Secondary | ICD-10-CM | POA: Diagnosis not present

## 2020-01-30 DIAGNOSIS — R001 Bradycardia, unspecified: Secondary | ICD-10-CM | POA: Diagnosis not present

## 2020-01-30 DIAGNOSIS — Z95810 Presence of automatic (implantable) cardiac defibrillator: Secondary | ICD-10-CM

## 2020-01-30 DIAGNOSIS — N2581 Secondary hyperparathyroidism of renal origin: Secondary | ICD-10-CM | POA: Diagnosis not present

## 2020-01-30 DIAGNOSIS — Z992 Dependence on renal dialysis: Secondary | ICD-10-CM | POA: Diagnosis not present

## 2020-01-30 DIAGNOSIS — N186 End stage renal disease: Secondary | ICD-10-CM | POA: Diagnosis not present

## 2020-01-30 HISTORY — DX: Dizziness and giddiness: R42

## 2020-01-30 HISTORY — DX: Presence of automatic (implantable) cardiac defibrillator: Z95.810

## 2020-01-30 HISTORY — DX: Bradycardia, unspecified: R00.1

## 2020-01-30 HISTORY — DX: Other fatigue: R53.83

## 2020-01-30 HISTORY — DX: Ventricular fibrillation: I49.01

## 2020-01-30 HISTORY — DX: Ventricular premature depolarization: I49.3

## 2020-01-30 MED ORDER — AMIODARONE HCL 200 MG PO TABS: 200.0000 mg | ORAL_TABLET | Freq: Every day | ORAL | 3 refills | Status: DC
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
## 2020-01-30 NOTE — Patient Instructions (Addendum)
Medication Instructions:  Your physician has recommended you make the following change in your medication:   DECREASE: Amiodarone 200 mg take one tablet by mouth daily.   *If you need a refill on your cardiac medications before your next appointment, please call your pharmacy*   Lab Work: None If you have labs (blood work) drawn today and your tests are completely normal, you will receive your results only by:  [place] (if you have MyChart) OR  A paper copy in the mail If you have any lab test that is abnormal or we need to change your treatment, we will call you to review the results.   Testing/Procedures: A zio monitor was ordered today. It will remain on for 7 days. You will then return monitor and event diary in provided box. It takes 1-2 weeks for report to be downloaded and returned to [place]. We will call you with the results. If monitor falls off or has orange flashing light, please call Zio for further instructions.      Follow-Up: At [facility], you and your health needs are our priority.  As part of our continuing mission to provide you with exceptional heart care, we have created designated Provider Care Teams.  These Care Teams include your primary Cardiologist (physician) and Advanced Practice Providers (APPs -  Physician Assistants and Nurse Practitioners) who all work together to provide you with the care you need, when you need it.  We recommend signing up for the patient portal called "MyChart".  Sign up information is provided on this After Visit Summary.  MyChart is used to connect with patients for Virtual Visits (Telemedicine).  Patients are able to view lab/test results, encounter notes, upcoming appointments, etc.  Non-urgent messages can be sent to your provider as well.   To learn more about what you can do with MyChart, go to [email or web address].    Your next appointment:   1 month(s)  The format for your next appointment:   In  Person  Provider:   Dr. Bettina Gavia   Other Instructions

## 2020-02-02 ENCOUNTER — Observation Stay (HOSPITAL_COMMUNITY)
Admission: EM | Admit: 2020-02-02 | Discharge: 2020-02-03 | Disposition: A | Discharge status: Home / Self Care | Payer: Medicare Other | Attending: Internal Medicine | Admitting: Internal Medicine

## 2020-02-02 ENCOUNTER — Telehealth: Payer: Self-pay | Admitting: Cardiology

## 2020-02-02 ENCOUNTER — Telehealth: Payer: Self-pay

## 2020-02-02 ENCOUNTER — Emergency Department (HOSPITAL_COMMUNITY): Payer: Medicare Other

## 2020-02-02 DIAGNOSIS — E11649 Type 2 diabetes mellitus with hypoglycemia without coma: Secondary | ICD-10-CM | POA: Diagnosis not present

## 2020-02-02 DIAGNOSIS — Z7901 Long term (current) use of anticoagulants: Secondary | ICD-10-CM | POA: Diagnosis not present

## 2020-02-02 DIAGNOSIS — N2581 Secondary hyperparathyroidism of renal origin: Secondary | ICD-10-CM | POA: Diagnosis not present

## 2020-02-02 DIAGNOSIS — I499 Cardiac arrhythmia, unspecified: Secondary | ICD-10-CM | POA: Diagnosis not present

## 2020-02-02 DIAGNOSIS — I469 Cardiac arrest, cause unspecified: Secondary | ICD-10-CM

## 2020-02-02 DIAGNOSIS — Z79899 Other long term (current) drug therapy: Secondary | ICD-10-CM | POA: Diagnosis not present

## 2020-02-02 DIAGNOSIS — I255 Ischemic cardiomyopathy: Secondary | ICD-10-CM | POA: Insufficient documentation

## 2020-02-02 DIAGNOSIS — D509 Iron deficiency anemia, unspecified: Secondary | ICD-10-CM | POA: Diagnosis not present

## 2020-02-02 DIAGNOSIS — I480 Paroxysmal atrial fibrillation: Secondary | ICD-10-CM

## 2020-02-02 DIAGNOSIS — I25110 Atherosclerotic heart disease of native coronary artery with unstable angina pectoris: Secondary | ICD-10-CM | POA: Diagnosis not present

## 2020-02-02 DIAGNOSIS — I10 Essential (primary) hypertension: Secondary | ICD-10-CM

## 2020-02-02 DIAGNOSIS — Z87891 Personal history of nicotine dependence: Secondary | ICD-10-CM | POA: Insufficient documentation

## 2020-02-02 DIAGNOSIS — I472 Ventricular tachycardia, unspecified: Secondary | ICD-10-CM

## 2020-02-02 DIAGNOSIS — Z7982 Long term (current) use of aspirin: Secondary | ICD-10-CM | POA: Insufficient documentation

## 2020-02-02 DIAGNOSIS — E1151 Type 2 diabetes mellitus with diabetic peripheral angiopathy without gangrene: Secondary | ICD-10-CM | POA: Insufficient documentation

## 2020-02-02 DIAGNOSIS — Z992 Dependence on renal dialysis: Secondary | ICD-10-CM | POA: Diagnosis not present

## 2020-02-02 DIAGNOSIS — E1142 Type 2 diabetes mellitus with diabetic polyneuropathy: Secondary | ICD-10-CM | POA: Diagnosis not present

## 2020-02-02 DIAGNOSIS — I132 Hypertensive heart and chronic kidney disease with heart failure and with stage 5 chronic kidney disease, or end stage renal disease: Secondary | ICD-10-CM | POA: Insufficient documentation

## 2020-02-02 DIAGNOSIS — D689 Coagulation defect, unspecified: Secondary | ICD-10-CM | POA: Diagnosis not present

## 2020-02-02 DIAGNOSIS — I2510 Atherosclerotic heart disease of native coronary artery without angina pectoris: Secondary | ICD-10-CM

## 2020-02-02 DIAGNOSIS — Z20822 Contact with and (suspected) exposure to covid-19: Secondary | ICD-10-CM | POA: Diagnosis not present

## 2020-02-02 DIAGNOSIS — I959 Hypotension, unspecified: Secondary | ICD-10-CM | POA: Diagnosis not present

## 2020-02-02 DIAGNOSIS — R569 Unspecified convulsions: Secondary | ICD-10-CM | POA: Diagnosis not present

## 2020-02-02 DIAGNOSIS — I5042 Chronic combined systolic (congestive) and diastolic (congestive) heart failure: Secondary | ICD-10-CM | POA: Insufficient documentation

## 2020-02-02 DIAGNOSIS — Z743 Need for continuous supervision: Secondary | ICD-10-CM | POA: Diagnosis not present

## 2020-02-02 DIAGNOSIS — N186 End stage renal disease: Secondary | ICD-10-CM | POA: Insufficient documentation

## 2020-02-02 DIAGNOSIS — J90 Pleural effusion, not elsewhere classified: Secondary | ICD-10-CM | POA: Diagnosis not present

## 2020-02-02 DIAGNOSIS — D631 Anemia in chronic kidney disease: Secondary | ICD-10-CM | POA: Diagnosis not present

## 2020-02-02 DIAGNOSIS — R404 Transient alteration of awareness: Secondary | ICD-10-CM | POA: Diagnosis not present

## 2020-02-02 DIAGNOSIS — Z7984 Long term (current) use of oral hypoglycemic drugs: Secondary | ICD-10-CM | POA: Insufficient documentation

## 2020-02-02 DIAGNOSIS — E1122 Type 2 diabetes mellitus with diabetic chronic kidney disease: Secondary | ICD-10-CM | POA: Insufficient documentation

## 2020-02-02 DIAGNOSIS — Z9861 Coronary angioplasty status: Secondary | ICD-10-CM | POA: Diagnosis not present

## 2020-02-02 DIAGNOSIS — R55 Syncope and collapse: Secondary | ICD-10-CM | POA: Diagnosis not present

## 2020-02-02 DIAGNOSIS — E1129 Type 2 diabetes mellitus with other diabetic kidney complication: Secondary | ICD-10-CM | POA: Diagnosis not present

## 2020-02-02 DIAGNOSIS — R6889 Other general symptoms and signs: Secondary | ICD-10-CM | POA: Diagnosis not present

## 2020-02-02 DIAGNOSIS — I110 Hypertensive heart disease with heart failure: Secondary | ICD-10-CM

## 2020-02-02 DIAGNOSIS — R079 Chest pain, unspecified: Secondary | ICD-10-CM | POA: Diagnosis not present

## 2020-02-02 DIAGNOSIS — I517 Cardiomegaly: Secondary | ICD-10-CM | POA: Diagnosis not present

## 2020-02-02 HISTORY — DX: Ventricular tachycardia: I47.2

## 2020-02-02 HISTORY — DX: Ventricular tachycardia, unspecified: I47.20

## 2020-02-02 LAB — CBC WITH DIFFERENTIAL/PLATELET
Abs Immature Granulocytes: 0.1 K/uL — ABNORMAL HIGH (ref 0.00–0.07)
Basophils Absolute: 0.1 K/uL (ref 0.0–0.1)
Basophils Relative: 1 %
Eosinophils Absolute: 0.2 K/uL (ref 0.0–0.5)
Eosinophils Relative: 2 %
HCT: 25.5 % — ABNORMAL LOW (ref 39.0–52.0)
Hemoglobin: 8.2 g/dL — ABNORMAL LOW (ref 13.0–17.0)
Immature Granulocytes: 1 %
Lymphocytes Relative: 8 %
Lymphs Abs: 0.9 K/uL (ref 0.7–4.0)
MCH: 32.7 pg (ref 26.0–34.0)
MCHC: 32.2 g/dL (ref 30.0–36.0)
MCV: 101.6 fL — ABNORMAL HIGH (ref 80.0–100.0)
Monocytes Absolute: 0.7 K/uL (ref 0.1–1.0)
Monocytes Relative: 7 %
Neutro Abs: 8.9 K/uL — ABNORMAL HIGH (ref 1.7–7.7)
Neutrophils Relative %: 81 %
Platelets: 300 K/uL (ref 150–400)
RBC: 2.51 MIL/uL — ABNORMAL LOW (ref 4.22–5.81)
RDW: 15.6 % — ABNORMAL HIGH (ref 11.5–15.5)
WBC: 10.9 K/uL — ABNORMAL HIGH (ref 4.0–10.5)
nRBC: 0 % (ref 0.0–0.2)

## 2020-02-02 LAB — MAGNESIUM: Magnesium: 2.5 mg/dL — ABNORMAL HIGH (ref 1.7–2.4)

## 2020-02-02 LAB — COMPREHENSIVE METABOLIC PANEL
ALT: 9 U/L (ref 0–44)
AST: 32 U/L (ref 15–41)
Albumin: 3.3 g/dL — ABNORMAL LOW (ref 3.5–5.0)
Alkaline Phosphatase: 90 U/L (ref 38–126)
Anion gap: 13 (ref 5–15)
BUN: 13 mg/dL (ref 8–23)
CO2: 27 mmol/L (ref 22–32)
Calcium: 8.2 mg/dL — ABNORMAL LOW (ref 8.9–10.3)
Chloride: 94 mmol/L — ABNORMAL LOW (ref 98–111)
Creatinine, Ser: 2.96 mg/dL — ABNORMAL HIGH (ref 0.61–1.24)
GFR, Estimated: 21 mL/min — ABNORMAL LOW (ref >60)
Glucose, Bld: 224 mg/dL — ABNORMAL HIGH (ref 70–99)
Potassium: 4.1 mmol/L (ref 3.5–5.1)
Sodium: 134 mmol/L — ABNORMAL LOW (ref 135–145)
Total Bilirubin: 1.8 mg/dL — ABNORMAL HIGH (ref 0.3–1.2)
Total Protein: 6.6 g/dL (ref 6.5–8.1)

## 2020-02-02 LAB — TROPONIN I (HIGH SENSITIVITY)
Troponin I (High Sensitivity): 26 ng/L — ABNORMAL HIGH (ref <18)
Troponin I (High Sensitivity): 34 ng/L — ABNORMAL HIGH (ref <18)

## 2020-02-02 LAB — RESPIRATORY PANEL BY RT PCR (FLU A&B, COVID)
Influenza A by PCR: NEGATIVE
Influenza B by PCR: NEGATIVE
SARS Coronavirus 2 by RT PCR: NEGATIVE

## 2020-02-02 LAB — GLUCOSE, CAPILLARY: Glucose-Capillary: 140 mg/dL — ABNORMAL HIGH (ref 70–99)

## 2020-02-02 MED ORDER — AMIODARONE HCL 200 MG PO TABS
200.0000 mg | ORAL_TABLET | Freq: Every day | ORAL | Status: DC
  Administered 2020-02-02 – 2020-02-03 (×2): 200 mg via ORAL
  Filled 2020-02-02 (×2): qty 1

## 2020-02-02 MED ORDER — ALLOPURINOL 100 MG PO TABS
100.0000 mg | ORAL_TABLET | Freq: Every day | ORAL | Status: DC
  Administered 2020-02-02: 100 mg via ORAL
  Filled 2020-02-02 (×2): qty 1

## 2020-02-02 MED ORDER — LATANOPROST 0.005 % OP SOLN
1.0000 [drp] | Freq: Every day | OPHTHALMIC | Status: DC
  Administered 2020-02-02: 1 [drp] via OPHTHALMIC
  Filled 2020-02-02 (×2): qty 2.5

## 2020-02-02 MED ORDER — ALBUTEROL SULFATE HFA 108 (90 BASE) MCG/ACT IN AERS
2.0000 | INHALATION_SPRAY | Freq: Four times a day (QID) | RESPIRATORY_TRACT | Status: DC | PRN
  Filled 2020-02-02: qty 6.7

## 2020-02-02 MED ORDER — ACETAMINOPHEN 325 MG PO TABS
650.0000 mg | ORAL_TABLET | ORAL | Status: DC | PRN
  Administered 2020-02-02: 650 mg via ORAL
  Filled 2020-02-02: qty 2

## 2020-02-02 MED ORDER — ONDANSETRON HCL 4 MG/2ML IJ SOLN
4.0000 mg | Freq: Four times a day (QID) | INTRAMUSCULAR | Status: DC | PRN
  Filled 2020-02-02: qty 2

## 2020-02-02 MED ORDER — ASPIRIN 81 MG PO CHEW
81.0000 mg | CHEWABLE_TABLET | Freq: Every day | ORAL | Status: DC
  Administered 2020-02-02 – 2020-02-03 (×2): 81 mg via ORAL
  Filled 2020-02-02 (×2): qty 1

## 2020-02-02 MED ORDER — SODIUM CHLORIDE 0.9% FLUSH
3.0000 mL | Freq: Two times a day (BID) | INTRAVENOUS | Status: DC
  Administered 2020-02-02 (×2): 3 mL via INTRAVENOUS

## 2020-02-02 MED ORDER — SODIUM CHLORIDE 0.9 % IV BOLUS
500.0000 mL | Freq: Once | INTRAVENOUS | Status: AC
  Administered 2020-02-02: 500 mL via INTRAVENOUS

## 2020-02-02 MED ORDER — ATORVASTATIN CALCIUM 40 MG PO TABS
40.0000 mg | ORAL_TABLET | Freq: Every evening | ORAL | Status: DC
  Administered 2020-02-02: 40 mg via ORAL
  Filled 2020-02-02: qty 1

## 2020-02-02 MED ORDER — OXYCODONE-ACETAMINOPHEN 5-325 MG PO TABS
1.0000 | ORAL_TABLET | ORAL | Status: DC | PRN
  Administered 2020-02-02: 1 via ORAL
  Filled 2020-02-02: qty 1

## 2020-02-02 MED ORDER — INSULIN ASPART 100 UNIT/ML ~~LOC~~ SOLN: 0.0000 [IU] | Freq: Three times a day (TID) | SUBCUTANEOUS | Status: DC

## 2020-02-02 MED ORDER — SEVELAMER CARBONATE 800 MG PO TABS
2400.0000 mg | ORAL_TABLET | Freq: Three times a day (TID) | ORAL | Status: DC
  Filled 2020-02-02: qty 3

## 2020-02-02 MED ORDER — SODIUM CHLORIDE 0.9% FLUSH: 3.0000 mL | INTRAVENOUS | Status: DC | PRN

## 2020-02-02 MED ORDER — NITROGLYCERIN 0.4 MG SL SUBL: 0.4000 mg | SUBLINGUAL_TABLET | SUBLINGUAL | Status: DC | PRN

## 2020-02-02 MED ORDER — SODIUM CHLORIDE 0.9 % IV SOLN: 250.0000 mL | INTRAVENOUS | Status: DC | PRN

## 2020-02-02 NOTE — Telephone Encounter (Signed)
-----   Message from Berniece Salines, DO sent at 01/30/2020  8:29 PM EDT ----- Regarding: blood work James Chan,     Please ask him to come today (10/11) for blood work: CBC, BMP, mag.  Godfrey Pick

## 2020-02-02 NOTE — H&P (Addendum)
Please see note title consult for H&P please  Tommye Standard, PA-C  Carleene Overlie Gila Lauf,MD

## 2020-02-02 NOTE — ED Triage Notes (Signed)
BIB by [facility] EMS after dialysis facility called to report pt having increase weakness, dizziness and hypotension ( 80/5o)  during dialysis. Upon arrival, EMS reports pt b/p increased and was A & O x 4. EMS reports that pt defib went off enroute to hospital. Pt became unresponsive and CPR was performed for approx. 3 min. Pt has hx of ESRD, MI, and recent defib placement.

## 2020-02-02 NOTE — Telephone Encounter (Signed)
Left message on patients voicemail to please return our call.   

## 2020-02-02 NOTE — ED Notes (Signed)
PO challenged with water, tolerated well, meal tray ordered per diet order

## 2020-02-02 NOTE — Telephone Encounter (Signed)
Patient's wife calling back. She states the patient had to be taken to the [facility] this morning due to low BP.

## 2020-02-02 NOTE — Progress Notes (Signed)
Discussed with Dr. Lovena Le, no Eliquis today, will revisit tomorrow prior to dsicharge timing to resume [place] Tommye Standard, PA-C

## 2020-02-02 NOTE — Telephone Encounter (Signed)
Follow Up:     Pt's wife is returning your call from this morning.
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
## 2020-02-02 NOTE — Telephone Encounter (Signed)
Spoke to patients wife just now and let her know that Dr. Harriet Masson would like for him to come in and get lab work done today. She verbalizes understanding and states that they will come in this afternoon after dialysis.    Encouraged patient to call back with any questions or concerns.

## 2020-02-03 ENCOUNTER — Ambulatory Visit: Payer: Medicare Other

## 2020-02-03 ENCOUNTER — Other Ambulatory Visit: Payer: Self-pay

## 2020-02-03 DIAGNOSIS — I480 Paroxysmal atrial fibrillation: Secondary | ICD-10-CM | POA: Diagnosis not present

## 2020-02-03 DIAGNOSIS — N186 End stage renal disease: Secondary | ICD-10-CM | POA: Diagnosis not present

## 2020-02-03 DIAGNOSIS — I120 Hypertensive chronic kidney disease with stage 5 chronic kidney disease or end stage renal disease: Secondary | ICD-10-CM | POA: Diagnosis not present

## 2020-02-03 DIAGNOSIS — I959 Hypotension, unspecified: Secondary | ICD-10-CM | POA: Diagnosis not present

## 2020-02-03 DIAGNOSIS — I469 Cardiac arrest, cause unspecified: Secondary | ICD-10-CM | POA: Diagnosis not present

## 2020-02-03 DIAGNOSIS — I472 Ventricular tachycardia: Secondary | ICD-10-CM | POA: Diagnosis not present

## 2020-02-03 DIAGNOSIS — Z992 Dependence on renal dialysis: Secondary | ICD-10-CM | POA: Diagnosis not present

## 2020-02-03 NOTE — Plan of Care (Signed)
Completed education and care plan, reviewed dc information.
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
## 2020-02-03 NOTE — Discharge Instructions (Signed)
No driving as previously instructed from your last admission  Please return the heart monitor to the company as we discussed.

## 2020-02-05 ENCOUNTER — Ambulatory Visit: Payer: Medicare Other | Admitting: Podiatry

## 2020-02-05 ENCOUNTER — Ambulatory Visit: Payer: Medicare Other

## 2020-02-05 DIAGNOSIS — I959 Hypotension, unspecified: Secondary | ICD-10-CM | POA: Diagnosis not present

## 2020-02-05 DIAGNOSIS — M545 Low back pain, unspecified: Secondary | ICD-10-CM | POA: Diagnosis not present

## 2020-02-05 DIAGNOSIS — G8929 Other chronic pain: Secondary | ICD-10-CM | POA: Diagnosis not present

## 2020-02-06 ENCOUNTER — Encounter: Payer: Self-pay | Admitting: *Deleted

## 2020-02-06 ENCOUNTER — Other Ambulatory Visit: Payer: Self-pay | Admitting: *Deleted

## 2020-02-06 DIAGNOSIS — N2581 Secondary hyperparathyroidism of renal origin: Secondary | ICD-10-CM | POA: Diagnosis not present

## 2020-02-06 DIAGNOSIS — Z992 Dependence on renal dialysis: Secondary | ICD-10-CM | POA: Diagnosis not present

## 2020-02-06 DIAGNOSIS — E1129 Type 2 diabetes mellitus with other diabetic kidney complication: Secondary | ICD-10-CM | POA: Diagnosis not present

## 2020-02-06 DIAGNOSIS — D509 Iron deficiency anemia, unspecified: Secondary | ICD-10-CM | POA: Diagnosis not present

## 2020-02-06 DIAGNOSIS — N186 End stage renal disease: Secondary | ICD-10-CM | POA: Diagnosis not present

## 2020-02-06 DIAGNOSIS — D631 Anemia in chronic kidney disease: Secondary | ICD-10-CM | POA: Diagnosis not present

## 2020-02-06 DIAGNOSIS — D689 Coagulation defect, unspecified: Secondary | ICD-10-CM | POA: Diagnosis not present
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
## 2020-02-09 DIAGNOSIS — E1129 Type 2 diabetes mellitus with other diabetic kidney complication: Secondary | ICD-10-CM | POA: Diagnosis not present

## 2020-02-09 DIAGNOSIS — Z992 Dependence on renal dialysis: Secondary | ICD-10-CM | POA: Diagnosis not present

## 2020-02-09 DIAGNOSIS — N186 End stage renal disease: Secondary | ICD-10-CM | POA: Diagnosis not present

## 2020-02-09 DIAGNOSIS — D631 Anemia in chronic kidney disease: Secondary | ICD-10-CM | POA: Diagnosis not present

## 2020-02-09 DIAGNOSIS — N2581 Secondary hyperparathyroidism of renal origin: Secondary | ICD-10-CM | POA: Diagnosis not present

## 2020-02-09 DIAGNOSIS — D509 Iron deficiency anemia, unspecified: Secondary | ICD-10-CM | POA: Diagnosis not present

## 2020-02-09 DIAGNOSIS — D689 Coagulation defect, unspecified: Secondary | ICD-10-CM | POA: Diagnosis not present

## 2020-02-10 ENCOUNTER — Telehealth: Payer: Self-pay | Admitting: Cardiology

## 2020-02-10 HISTORY — DX: Hypocalcemia: E83.51

## 2020-02-10 NOTE — Telephone Encounter (Signed)
Pt dropped DMV forms off today 02/10/20 completed authorization placed in Dr. Joya Gaskins box/kbl 02/10/20

## 2020-02-11 ENCOUNTER — Telehealth: Payer: Self-pay | Admitting: Cardiology

## 2020-02-11 ENCOUNTER — Encounter: Payer: Self-pay | Admitting: Student

## 2020-02-11 DIAGNOSIS — D631 Anemia in chronic kidney disease: Secondary | ICD-10-CM | POA: Diagnosis not present

## 2020-02-11 DIAGNOSIS — I493 Ventricular premature depolarization: Secondary | ICD-10-CM | POA: Diagnosis not present

## 2020-02-11 DIAGNOSIS — E1129 Type 2 diabetes mellitus with other diabetic kidney complication: Secondary | ICD-10-CM | POA: Diagnosis not present

## 2020-02-11 DIAGNOSIS — N186 End stage renal disease: Secondary | ICD-10-CM | POA: Diagnosis not present

## 2020-02-11 DIAGNOSIS — D509 Iron deficiency anemia, unspecified: Secondary | ICD-10-CM | POA: Diagnosis not present

## 2020-02-11 DIAGNOSIS — D689 Coagulation defect, unspecified: Secondary | ICD-10-CM | POA: Diagnosis not present

## 2020-02-11 DIAGNOSIS — Z992 Dependence on renal dialysis: Secondary | ICD-10-CM | POA: Diagnosis not present

## 2020-02-11 DIAGNOSIS — N2581 Secondary hyperparathyroidism of renal origin: Secondary | ICD-10-CM | POA: Diagnosis not present

## 2020-02-11 NOTE — Telephone Encounter (Signed)
Diamond with [employer] [facility] states that the James Chan had back to back pauses for approximately 27 seconds with the longest one being 19.3 seconds on 02/02/20 at 10:42 am. Will request Abbie to download the result.

## 2020-02-11 NOTE — Telephone Encounter (Signed)
Can you please download the report into Epic. Thanks

## 2020-02-11 NOTE — Telephone Encounter (Signed)
Diamond from [employer] is calling to give abnormal zio patch results.

## 2020-02-12 DIAGNOSIS — M545 Low back pain, unspecified: Secondary | ICD-10-CM | POA: Diagnosis not present

## 2020-02-12 DIAGNOSIS — G8929 Other chronic pain: Secondary | ICD-10-CM | POA: Diagnosis not present

## 2020-02-12 DIAGNOSIS — Z23 Encounter for immunization: Secondary | ICD-10-CM | POA: Diagnosis not present

## 2020-02-12 DIAGNOSIS — I959 Hypotension, unspecified: Secondary | ICD-10-CM | POA: Diagnosis not present

## 2020-02-12 NOTE — Telephone Encounter (Signed)
Pt wants to know the best time to pick up the DMV forms.

## 2020-02-12 NOTE — Telephone Encounter (Signed)
Per Dr. Bettina Gavia the patient should take these forms to be filled out by his PCP. I reached out to Mr. Kowalchuk to let him know this but he did not answer and his voicemail was full at this time.

## 2020-02-12 NOTE — Telephone Encounter (Signed)
Not sure what to tell this pt related to his forms.

## 2020-02-13 DIAGNOSIS — Z992 Dependence on renal dialysis: Secondary | ICD-10-CM | POA: Diagnosis not present

## 2020-02-13 DIAGNOSIS — D509 Iron deficiency anemia, unspecified: Secondary | ICD-10-CM | POA: Diagnosis not present

## 2020-02-13 DIAGNOSIS — E1129 Type 2 diabetes mellitus with other diabetic kidney complication: Secondary | ICD-10-CM | POA: Diagnosis not present

## 2020-02-13 DIAGNOSIS — N186 End stage renal disease: Secondary | ICD-10-CM | POA: Diagnosis not present

## 2020-02-13 DIAGNOSIS — D631 Anemia in chronic kidney disease: Secondary | ICD-10-CM | POA: Diagnosis not present

## 2020-02-13 DIAGNOSIS — D689 Coagulation defect, unspecified: Secondary | ICD-10-CM | POA: Diagnosis not present

## 2020-02-13 DIAGNOSIS — N2581 Secondary hyperparathyroidism of renal origin: Secondary | ICD-10-CM | POA: Diagnosis not present

## 2020-02-16 ENCOUNTER — Telehealth: Payer: Self-pay | Admitting: Internal Medicine

## 2020-02-16 DIAGNOSIS — Z992 Dependence on renal dialysis: Secondary | ICD-10-CM | POA: Diagnosis not present

## 2020-02-16 DIAGNOSIS — D509 Iron deficiency anemia, unspecified: Secondary | ICD-10-CM | POA: Diagnosis not present

## 2020-02-16 DIAGNOSIS — D631 Anemia in chronic kidney disease: Secondary | ICD-10-CM | POA: Diagnosis not present

## 2020-02-16 DIAGNOSIS — D689 Coagulation defect, unspecified: Secondary | ICD-10-CM | POA: Diagnosis not present

## 2020-02-16 DIAGNOSIS — N2581 Secondary hyperparathyroidism of renal origin: Secondary | ICD-10-CM | POA: Diagnosis not present

## 2020-02-16 DIAGNOSIS — N186 End stage renal disease: Secondary | ICD-10-CM | POA: Diagnosis not present

## 2020-02-16 DIAGNOSIS — E1129 Type 2 diabetes mellitus with other diabetic kidney complication: Secondary | ICD-10-CM | POA: Diagnosis not present

## 2020-02-16 NOTE — Telephone Encounter (Signed)
New message:     Patient calling to see if his paper work is done for him to pick up.

## 2020-02-16 NOTE — Telephone Encounter (Signed)
I attempted to call this pt back but the phone said due to network problems unable to make call at this time.

## 2020-02-17 ENCOUNTER — Encounter: Payer: Medicare Other | Admitting: Student

## 2020-02-17 NOTE — Telephone Encounter (Signed)
Patient called back in to check on status of DMV paperwork. Connected call to [place].

## 2020-02-17 NOTE — Telephone Encounter (Signed)
Spoke to the patient just now and let him know that per Dr. Bettina Gavia the paperwork needs to be filled out by his PCP. Patient is understanding and states that he will come back to pick up the paperwork.    Encouraged patient to call back with any questions or concerns.

## 2020-02-19 DIAGNOSIS — G4700 Insomnia, unspecified: Secondary | ICD-10-CM | POA: Diagnosis not present

## 2020-02-19 DIAGNOSIS — I959 Hypotension, unspecified: Secondary | ICD-10-CM | POA: Diagnosis not present

## 2020-02-19 DIAGNOSIS — M545 Low back pain, unspecified: Secondary | ICD-10-CM | POA: Diagnosis not present

## 2020-02-19 DIAGNOSIS — G8929 Other chronic pain: Secondary | ICD-10-CM | POA: Diagnosis not present

## 2020-02-20 ENCOUNTER — Encounter: Payer: Self-pay | Admitting: *Deleted

## 2020-02-20 ENCOUNTER — Other Ambulatory Visit: Payer: Self-pay | Admitting: *Deleted

## 2020-02-20 DIAGNOSIS — D509 Iron deficiency anemia, unspecified: Secondary | ICD-10-CM | POA: Diagnosis not present

## 2020-02-20 DIAGNOSIS — E1129 Type 2 diabetes mellitus with other diabetic kidney complication: Secondary | ICD-10-CM | POA: Diagnosis not present

## 2020-02-20 DIAGNOSIS — D631 Anemia in chronic kidney disease: Secondary | ICD-10-CM | POA: Diagnosis not present

## 2020-02-20 DIAGNOSIS — D689 Coagulation defect, unspecified: Secondary | ICD-10-CM | POA: Diagnosis not present

## 2020-02-20 DIAGNOSIS — Z992 Dependence on renal dialysis: Secondary | ICD-10-CM | POA: Diagnosis not present

## 2020-02-20 DIAGNOSIS — N2581 Secondary hyperparathyroidism of renal origin: Secondary | ICD-10-CM | POA: Diagnosis not present

## 2020-02-20 DIAGNOSIS — N186 End stage renal disease: Secondary | ICD-10-CM | POA: Diagnosis not present
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
## 2020-02-22 DIAGNOSIS — E1122 Type 2 diabetes mellitus with diabetic chronic kidney disease: Secondary | ICD-10-CM | POA: Diagnosis not present

## 2020-02-22 DIAGNOSIS — Z992 Dependence on renal dialysis: Secondary | ICD-10-CM | POA: Diagnosis not present

## 2020-02-22 DIAGNOSIS — N186 End stage renal disease: Secondary | ICD-10-CM | POA: Diagnosis not present

## 2020-02-23 DIAGNOSIS — D689 Coagulation defect, unspecified: Secondary | ICD-10-CM | POA: Diagnosis not present

## 2020-02-23 DIAGNOSIS — Z992 Dependence on renal dialysis: Secondary | ICD-10-CM | POA: Diagnosis not present

## 2020-02-23 DIAGNOSIS — D631 Anemia in chronic kidney disease: Secondary | ICD-10-CM | POA: Diagnosis not present

## 2020-02-23 DIAGNOSIS — D509 Iron deficiency anemia, unspecified: Secondary | ICD-10-CM | POA: Diagnosis not present

## 2020-02-23 DIAGNOSIS — N186 End stage renal disease: Secondary | ICD-10-CM | POA: Diagnosis not present

## 2020-02-23 DIAGNOSIS — N2581 Secondary hyperparathyroidism of renal origin: Secondary | ICD-10-CM | POA: Diagnosis not present

## 2020-02-25 DIAGNOSIS — N2581 Secondary hyperparathyroidism of renal origin: Secondary | ICD-10-CM | POA: Diagnosis not present

## 2020-02-25 DIAGNOSIS — N186 End stage renal disease: Secondary | ICD-10-CM | POA: Diagnosis not present

## 2020-02-25 DIAGNOSIS — D689 Coagulation defect, unspecified: Secondary | ICD-10-CM | POA: Diagnosis not present

## 2020-02-25 DIAGNOSIS — D631 Anemia in chronic kidney disease: Secondary | ICD-10-CM | POA: Diagnosis not present

## 2020-02-25 DIAGNOSIS — Z992 Dependence on renal dialysis: Secondary | ICD-10-CM | POA: Diagnosis not present

## 2020-02-25 DIAGNOSIS — D509 Iron deficiency anemia, unspecified: Secondary | ICD-10-CM | POA: Diagnosis not present

## 2020-02-27 DIAGNOSIS — Z992 Dependence on renal dialysis: Secondary | ICD-10-CM | POA: Diagnosis not present

## 2020-02-27 DIAGNOSIS — D631 Anemia in chronic kidney disease: Secondary | ICD-10-CM | POA: Diagnosis not present

## 2020-02-27 DIAGNOSIS — N2581 Secondary hyperparathyroidism of renal origin: Secondary | ICD-10-CM | POA: Diagnosis not present

## 2020-02-27 DIAGNOSIS — N186 End stage renal disease: Secondary | ICD-10-CM | POA: Diagnosis not present

## 2020-02-27 DIAGNOSIS — D689 Coagulation defect, unspecified: Secondary | ICD-10-CM | POA: Diagnosis not present

## 2020-02-27 DIAGNOSIS — D509 Iron deficiency anemia, unspecified: Secondary | ICD-10-CM | POA: Diagnosis not present

## 2020-03-01 DIAGNOSIS — N2581 Secondary hyperparathyroidism of renal origin: Secondary | ICD-10-CM | POA: Diagnosis not present

## 2020-03-01 DIAGNOSIS — D689 Coagulation defect, unspecified: Secondary | ICD-10-CM | POA: Diagnosis not present

## 2020-03-01 DIAGNOSIS — Z992 Dependence on renal dialysis: Secondary | ICD-10-CM | POA: Diagnosis not present

## 2020-03-01 DIAGNOSIS — D509 Iron deficiency anemia, unspecified: Secondary | ICD-10-CM | POA: Diagnosis not present

## 2020-03-01 DIAGNOSIS — N186 End stage renal disease: Secondary | ICD-10-CM | POA: Diagnosis not present

## 2020-03-01 DIAGNOSIS — D631 Anemia in chronic kidney disease: Secondary | ICD-10-CM | POA: Diagnosis not present

## 2020-03-03 DIAGNOSIS — D689 Coagulation defect, unspecified: Secondary | ICD-10-CM | POA: Diagnosis not present

## 2020-03-03 DIAGNOSIS — D509 Iron deficiency anemia, unspecified: Secondary | ICD-10-CM | POA: Diagnosis not present

## 2020-03-03 DIAGNOSIS — N2581 Secondary hyperparathyroidism of renal origin: Secondary | ICD-10-CM | POA: Diagnosis not present

## 2020-03-03 DIAGNOSIS — Z992 Dependence on renal dialysis: Secondary | ICD-10-CM | POA: Diagnosis not present

## 2020-03-03 DIAGNOSIS — D631 Anemia in chronic kidney disease: Secondary | ICD-10-CM | POA: Diagnosis not present

## 2020-03-03 DIAGNOSIS — N186 End stage renal disease: Secondary | ICD-10-CM | POA: Diagnosis not present

## 2020-03-04 DIAGNOSIS — G4700 Insomnia, unspecified: Secondary | ICD-10-CM | POA: Diagnosis not present

## 2020-03-04 DIAGNOSIS — I959 Hypotension, unspecified: Secondary | ICD-10-CM | POA: Diagnosis not present

## 2020-03-04 DIAGNOSIS — M545 Low back pain, unspecified: Secondary | ICD-10-CM | POA: Diagnosis not present

## 2020-03-04 DIAGNOSIS — G8929 Other chronic pain: Secondary | ICD-10-CM | POA: Diagnosis not present

## 2020-03-05 DIAGNOSIS — N2581 Secondary hyperparathyroidism of renal origin: Secondary | ICD-10-CM | POA: Diagnosis not present

## 2020-03-05 DIAGNOSIS — Z992 Dependence on renal dialysis: Secondary | ICD-10-CM | POA: Diagnosis not present

## 2020-03-05 DIAGNOSIS — D631 Anemia in chronic kidney disease: Secondary | ICD-10-CM | POA: Diagnosis not present

## 2020-03-05 DIAGNOSIS — N186 End stage renal disease: Secondary | ICD-10-CM | POA: Diagnosis not present

## 2020-03-05 DIAGNOSIS — D509 Iron deficiency anemia, unspecified: Secondary | ICD-10-CM | POA: Diagnosis not present

## 2020-03-05 DIAGNOSIS — D689 Coagulation defect, unspecified: Secondary | ICD-10-CM | POA: Diagnosis not present

## 2020-03-08 DIAGNOSIS — Z992 Dependence on renal dialysis: Secondary | ICD-10-CM | POA: Diagnosis not present

## 2020-03-08 DIAGNOSIS — D689 Coagulation defect, unspecified: Secondary | ICD-10-CM | POA: Diagnosis not present

## 2020-03-08 DIAGNOSIS — D509 Iron deficiency anemia, unspecified: Secondary | ICD-10-CM | POA: Diagnosis not present

## 2020-03-08 DIAGNOSIS — D631 Anemia in chronic kidney disease: Secondary | ICD-10-CM | POA: Diagnosis not present

## 2020-03-08 DIAGNOSIS — N2581 Secondary hyperparathyroidism of renal origin: Secondary | ICD-10-CM | POA: Diagnosis not present

## 2020-03-08 DIAGNOSIS — N186 End stage renal disease: Secondary | ICD-10-CM | POA: Diagnosis not present

## 2020-03-10 DIAGNOSIS — N2581 Secondary hyperparathyroidism of renal origin: Secondary | ICD-10-CM | POA: Diagnosis not present

## 2020-03-10 DIAGNOSIS — D509 Iron deficiency anemia, unspecified: Secondary | ICD-10-CM | POA: Diagnosis not present

## 2020-03-10 DIAGNOSIS — D689 Coagulation defect, unspecified: Secondary | ICD-10-CM | POA: Diagnosis not present

## 2020-03-10 DIAGNOSIS — N186 End stage renal disease: Secondary | ICD-10-CM | POA: Diagnosis not present

## 2020-03-10 DIAGNOSIS — D631 Anemia in chronic kidney disease: Secondary | ICD-10-CM | POA: Diagnosis not present

## 2020-03-10 DIAGNOSIS — Z992 Dependence on renal dialysis: Secondary | ICD-10-CM | POA: Diagnosis not present

## 2020-03-11 ENCOUNTER — Encounter: Payer: Self-pay | Admitting: Podiatry

## 2020-03-11 ENCOUNTER — Ambulatory Visit: Payer: Medicare Other | Admitting: Podiatry

## 2020-03-11 ENCOUNTER — Other Ambulatory Visit: Payer: Self-pay

## 2020-03-11 DIAGNOSIS — B351 Tinea unguium: Secondary | ICD-10-CM | POA: Diagnosis not present

## 2020-03-11 DIAGNOSIS — E1142 Type 2 diabetes mellitus with diabetic polyneuropathy: Secondary | ICD-10-CM

## 2020-03-11 DIAGNOSIS — M79674 Pain in right toe(s): Secondary | ICD-10-CM

## 2020-03-11 DIAGNOSIS — E119 Type 2 diabetes mellitus without complications: Secondary | ICD-10-CM

## 2020-03-11 DIAGNOSIS — M79675 Pain in left toe(s): Secondary | ICD-10-CM

## 2020-03-11 DIAGNOSIS — N186 End stage renal disease: Secondary | ICD-10-CM

## 2020-03-11 DIAGNOSIS — Z992 Dependence on renal dialysis: Secondary | ICD-10-CM
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
## 2020-03-12 DIAGNOSIS — D689 Coagulation defect, unspecified: Secondary | ICD-10-CM | POA: Diagnosis not present

## 2020-03-12 DIAGNOSIS — H409 Unspecified glaucoma: Secondary | ICD-10-CM | POA: Insufficient documentation

## 2020-03-12 DIAGNOSIS — M109 Gout, unspecified: Secondary | ICD-10-CM | POA: Insufficient documentation

## 2020-03-12 DIAGNOSIS — D631 Anemia in chronic kidney disease: Secondary | ICD-10-CM | POA: Diagnosis not present

## 2020-03-12 DIAGNOSIS — D509 Iron deficiency anemia, unspecified: Secondary | ICD-10-CM | POA: Diagnosis not present

## 2020-03-12 DIAGNOSIS — E119 Type 2 diabetes mellitus without complications: Secondary | ICD-10-CM | POA: Insufficient documentation

## 2020-03-12 DIAGNOSIS — I509 Heart failure, unspecified: Secondary | ICD-10-CM | POA: Insufficient documentation

## 2020-03-12 DIAGNOSIS — E785 Hyperlipidemia, unspecified: Secondary | ICD-10-CM | POA: Insufficient documentation

## 2020-03-12 DIAGNOSIS — M1990 Unspecified osteoarthritis, unspecified site: Secondary | ICD-10-CM | POA: Insufficient documentation

## 2020-03-12 DIAGNOSIS — I5089 Other heart failure: Secondary | ICD-10-CM | POA: Insufficient documentation

## 2020-03-12 DIAGNOSIS — I639 Cerebral infarction, unspecified: Secondary | ICD-10-CM | POA: Insufficient documentation

## 2020-03-12 DIAGNOSIS — I10 Essential (primary) hypertension: Secondary | ICD-10-CM | POA: Insufficient documentation

## 2020-03-12 DIAGNOSIS — N186 End stage renal disease: Secondary | ICD-10-CM | POA: Insufficient documentation

## 2020-03-12 DIAGNOSIS — R0600 Dyspnea, unspecified: Secondary | ICD-10-CM | POA: Insufficient documentation

## 2020-03-12 DIAGNOSIS — G4730 Sleep apnea, unspecified: Secondary | ICD-10-CM | POA: Insufficient documentation

## 2020-03-12 DIAGNOSIS — N2581 Secondary hyperparathyroidism of renal origin: Secondary | ICD-10-CM | POA: Diagnosis not present

## 2020-03-12 DIAGNOSIS — Z992 Dependence on renal dialysis: Secondary | ICD-10-CM | POA: Diagnosis not present

## 2020-03-12 DIAGNOSIS — Z9289 Personal history of other medical treatment: Secondary | ICD-10-CM | POA: Insufficient documentation

## 2020-03-14 DIAGNOSIS — D631 Anemia in chronic kidney disease: Secondary | ICD-10-CM | POA: Diagnosis not present

## 2020-03-14 DIAGNOSIS — N2581 Secondary hyperparathyroidism of renal origin: Secondary | ICD-10-CM | POA: Diagnosis not present

## 2020-03-14 DIAGNOSIS — D689 Coagulation defect, unspecified: Secondary | ICD-10-CM | POA: Diagnosis not present

## 2020-03-14 DIAGNOSIS — D509 Iron deficiency anemia, unspecified: Secondary | ICD-10-CM | POA: Diagnosis not present

## 2020-03-14 DIAGNOSIS — N186 End stage renal disease: Secondary | ICD-10-CM | POA: Diagnosis not present

## 2020-03-14 DIAGNOSIS — Z992 Dependence on renal dialysis: Secondary | ICD-10-CM | POA: Diagnosis not present

## 2020-03-16 DIAGNOSIS — N2581 Secondary hyperparathyroidism of renal origin: Secondary | ICD-10-CM | POA: Diagnosis not present

## 2020-03-16 DIAGNOSIS — D689 Coagulation defect, unspecified: Secondary | ICD-10-CM | POA: Diagnosis not present

## 2020-03-16 DIAGNOSIS — D631 Anemia in chronic kidney disease: Secondary | ICD-10-CM | POA: Diagnosis not present

## 2020-03-16 DIAGNOSIS — D509 Iron deficiency anemia, unspecified: Secondary | ICD-10-CM | POA: Diagnosis not present

## 2020-03-16 DIAGNOSIS — Z992 Dependence on renal dialysis: Secondary | ICD-10-CM | POA: Diagnosis not present

## 2020-03-16 DIAGNOSIS — N186 End stage renal disease: Secondary | ICD-10-CM | POA: Diagnosis not present

## 2020-03-19 DIAGNOSIS — N186 End stage renal disease: Secondary | ICD-10-CM | POA: Diagnosis not present

## 2020-03-19 DIAGNOSIS — N2581 Secondary hyperparathyroidism of renal origin: Secondary | ICD-10-CM | POA: Diagnosis not present

## 2020-03-19 DIAGNOSIS — D631 Anemia in chronic kidney disease: Secondary | ICD-10-CM | POA: Diagnosis not present

## 2020-03-19 DIAGNOSIS — D509 Iron deficiency anemia, unspecified: Secondary | ICD-10-CM | POA: Diagnosis not present

## 2020-03-19 DIAGNOSIS — Z992 Dependence on renal dialysis: Secondary | ICD-10-CM | POA: Diagnosis not present

## 2020-03-19 DIAGNOSIS — D689 Coagulation defect, unspecified: Secondary | ICD-10-CM | POA: Diagnosis not present

## 2020-03-23 ENCOUNTER — Ambulatory Visit: Payer: Medicare Other | Admitting: Cardiology

## 2020-03-23 DIAGNOSIS — Z992 Dependence on renal dialysis: Secondary | ICD-10-CM | POA: Diagnosis not present

## 2020-03-23 DIAGNOSIS — E1122 Type 2 diabetes mellitus with diabetic chronic kidney disease: Secondary | ICD-10-CM | POA: Diagnosis not present

## 2020-03-23 DIAGNOSIS — N186 End stage renal disease: Secondary | ICD-10-CM | POA: Diagnosis not present

## 2020-03-24 DIAGNOSIS — N186 End stage renal disease: Secondary | ICD-10-CM | POA: Diagnosis not present

## 2020-03-24 DIAGNOSIS — D689 Coagulation defect, unspecified: Secondary | ICD-10-CM | POA: Diagnosis not present

## 2020-03-24 DIAGNOSIS — Z992 Dependence on renal dialysis: Secondary | ICD-10-CM | POA: Diagnosis not present

## 2020-03-24 DIAGNOSIS — D631 Anemia in chronic kidney disease: Secondary | ICD-10-CM | POA: Diagnosis not present

## 2020-03-24 DIAGNOSIS — N2581 Secondary hyperparathyroidism of renal origin: Secondary | ICD-10-CM | POA: Diagnosis not present

## 2020-03-26 DIAGNOSIS — D689 Coagulation defect, unspecified: Secondary | ICD-10-CM | POA: Diagnosis not present

## 2020-03-26 DIAGNOSIS — Z992 Dependence on renal dialysis: Secondary | ICD-10-CM | POA: Diagnosis not present

## 2020-03-26 DIAGNOSIS — D631 Anemia in chronic kidney disease: Secondary | ICD-10-CM | POA: Diagnosis not present

## 2020-03-26 DIAGNOSIS — N186 End stage renal disease: Secondary | ICD-10-CM | POA: Diagnosis not present

## 2020-03-26 DIAGNOSIS — N2581 Secondary hyperparathyroidism of renal origin: Secondary | ICD-10-CM | POA: Diagnosis not present

## 2020-03-29 DIAGNOSIS — N186 End stage renal disease: Secondary | ICD-10-CM | POA: Diagnosis not present

## 2020-03-29 DIAGNOSIS — Z992 Dependence on renal dialysis: Secondary | ICD-10-CM | POA: Diagnosis not present

## 2020-03-29 DIAGNOSIS — N2581 Secondary hyperparathyroidism of renal origin: Secondary | ICD-10-CM | POA: Diagnosis not present

## 2020-03-29 DIAGNOSIS — D631 Anemia in chronic kidney disease: Secondary | ICD-10-CM | POA: Diagnosis not present

## 2020-03-29 DIAGNOSIS — D689 Coagulation defect, unspecified: Secondary | ICD-10-CM | POA: Diagnosis not present

## 2020-03-31 DIAGNOSIS — N2581 Secondary hyperparathyroidism of renal origin: Secondary | ICD-10-CM | POA: Diagnosis not present

## 2020-03-31 DIAGNOSIS — N186 End stage renal disease: Secondary | ICD-10-CM | POA: Diagnosis not present

## 2020-03-31 DIAGNOSIS — D631 Anemia in chronic kidney disease: Secondary | ICD-10-CM | POA: Diagnosis not present

## 2020-03-31 DIAGNOSIS — D689 Coagulation defect, unspecified: Secondary | ICD-10-CM | POA: Diagnosis not present

## 2020-03-31 DIAGNOSIS — Z992 Dependence on renal dialysis: Secondary | ICD-10-CM | POA: Diagnosis not present

## 2020-04-01 DIAGNOSIS — E785 Hyperlipidemia, unspecified: Secondary | ICD-10-CM | POA: Diagnosis not present

## 2020-04-01 DIAGNOSIS — M545 Low back pain, unspecified: Secondary | ICD-10-CM | POA: Diagnosis not present

## 2020-04-01 DIAGNOSIS — G8929 Other chronic pain: Secondary | ICD-10-CM | POA: Diagnosis not present

## 2020-04-01 DIAGNOSIS — I10 Essential (primary) hypertension: Secondary | ICD-10-CM | POA: Diagnosis not present

## 2020-04-01 DIAGNOSIS — E11319 Type 2 diabetes mellitus with unspecified diabetic retinopathy without macular edema: Secondary | ICD-10-CM | POA: Diagnosis not present

## 2020-04-01 DIAGNOSIS — I959 Hypotension, unspecified: Secondary | ICD-10-CM | POA: Diagnosis not present

## 2020-04-01 DIAGNOSIS — G4700 Insomnia, unspecified: Secondary | ICD-10-CM | POA: Diagnosis not present

## 2020-04-02 DIAGNOSIS — N186 End stage renal disease: Secondary | ICD-10-CM | POA: Diagnosis not present

## 2020-04-02 DIAGNOSIS — D631 Anemia in chronic kidney disease: Secondary | ICD-10-CM | POA: Diagnosis not present

## 2020-04-02 DIAGNOSIS — Z992 Dependence on renal dialysis: Secondary | ICD-10-CM | POA: Diagnosis not present

## 2020-04-02 DIAGNOSIS — D689 Coagulation defect, unspecified: Secondary | ICD-10-CM | POA: Diagnosis not present

## 2020-04-02 DIAGNOSIS — N2581 Secondary hyperparathyroidism of renal origin: Secondary | ICD-10-CM | POA: Diagnosis not present

## 2020-04-05 DIAGNOSIS — N186 End stage renal disease: Secondary | ICD-10-CM | POA: Diagnosis not present

## 2020-04-05 DIAGNOSIS — N2581 Secondary hyperparathyroidism of renal origin: Secondary | ICD-10-CM | POA: Diagnosis not present

## 2020-04-05 DIAGNOSIS — D631 Anemia in chronic kidney disease: Secondary | ICD-10-CM | POA: Diagnosis not present

## 2020-04-05 DIAGNOSIS — D689 Coagulation defect, unspecified: Secondary | ICD-10-CM | POA: Diagnosis not present

## 2020-04-05 DIAGNOSIS — Z992 Dependence on renal dialysis: Secondary | ICD-10-CM | POA: Diagnosis not present

## 2020-04-07 DIAGNOSIS — N2581 Secondary hyperparathyroidism of renal origin: Secondary | ICD-10-CM | POA: Diagnosis not present

## 2020-04-07 DIAGNOSIS — N186 End stage renal disease: Secondary | ICD-10-CM | POA: Diagnosis not present

## 2020-04-07 DIAGNOSIS — Z992 Dependence on renal dialysis: Secondary | ICD-10-CM | POA: Diagnosis not present

## 2020-04-07 DIAGNOSIS — D689 Coagulation defect, unspecified: Secondary | ICD-10-CM | POA: Diagnosis not present

## 2020-04-07 DIAGNOSIS — D631 Anemia in chronic kidney disease: Secondary | ICD-10-CM | POA: Diagnosis not present
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
## 2020-04-08 ENCOUNTER — Encounter: Payer: Self-pay | Admitting: Cardiology

## 2020-04-08 ENCOUNTER — Other Ambulatory Visit: Payer: Self-pay

## 2020-04-08 ENCOUNTER — Ambulatory Visit (INDEPENDENT_AMBULATORY_CARE_PROVIDER_SITE_OTHER): Payer: Medicare Other | Admitting: Cardiology

## 2020-04-08 VITALS — BP 142/70 | HR 57 | Ht 72.0 in | Wt 185.0 lb

## 2020-04-08 DIAGNOSIS — Z7901 Long term (current) use of anticoagulants: Secondary | ICD-10-CM

## 2020-04-08 DIAGNOSIS — Z95810 Presence of automatic (implantable) cardiac defibrillator: Secondary | ICD-10-CM

## 2020-04-08 DIAGNOSIS — I25708 Atherosclerosis of coronary artery bypass graft(s), unspecified, with other forms of angina pectoris: Secondary | ICD-10-CM

## 2020-04-08 DIAGNOSIS — Z79899 Other long term (current) drug therapy: Secondary | ICD-10-CM

## 2020-04-08 DIAGNOSIS — I110 Hypertensive heart disease with heart failure: Secondary | ICD-10-CM

## 2020-04-08 DIAGNOSIS — I5042 Chronic combined systolic (congestive) and diastolic (congestive) heart failure: Secondary | ICD-10-CM

## 2020-04-08 DIAGNOSIS — E782 Mixed hyperlipidemia: Secondary | ICD-10-CM

## 2020-04-08 DIAGNOSIS — I469 Cardiac arrest, cause unspecified: Secondary | ICD-10-CM | POA: Diagnosis not present

## 2020-04-08 DIAGNOSIS — I480 Paroxysmal atrial fibrillation: Secondary | ICD-10-CM

## 2020-04-08 DIAGNOSIS — I472 Ventricular tachycardia, unspecified: Secondary | ICD-10-CM

## 2020-04-08 DIAGNOSIS — N186 End stage renal disease: Secondary | ICD-10-CM

## 2020-04-08 DIAGNOSIS — Z992 Dependence on renal dialysis: Secondary | ICD-10-CM

## 2020-04-08 MED ORDER — TORSEMIDE 100 MG PO TABS: 50.0000 mg | ORAL_TABLET | Freq: Every day | ORAL | 3 refills | Status: DC

## 2020-04-08 NOTE — Patient Instructions (Signed)

## 2020-04-08 NOTE — Addendum Note (Signed)
Addended by: Resa Miner I on: 04/08/2020 03:25 PM   Modules accepted: Orders

## 2020-04-09 DIAGNOSIS — N186 End stage renal disease: Secondary | ICD-10-CM | POA: Diagnosis not present

## 2020-04-09 DIAGNOSIS — N2581 Secondary hyperparathyroidism of renal origin: Secondary | ICD-10-CM | POA: Diagnosis not present

## 2020-04-09 DIAGNOSIS — Z992 Dependence on renal dialysis: Secondary | ICD-10-CM | POA: Diagnosis not present

## 2020-04-09 DIAGNOSIS — D631 Anemia in chronic kidney disease: Secondary | ICD-10-CM | POA: Diagnosis not present

## 2020-04-09 DIAGNOSIS — D689 Coagulation defect, unspecified: Secondary | ICD-10-CM | POA: Diagnosis not present

## 2020-04-12 ENCOUNTER — Encounter: Payer: Self-pay | Admitting: Surgery

## 2020-04-12 ENCOUNTER — Other Ambulatory Visit: Payer: Self-pay

## 2020-04-12 ENCOUNTER — Ambulatory Visit: Payer: Medicare Other | Admitting: Surgery

## 2020-04-12 VITALS — BP 177/71 | HR 54 | Temp 98.2°F | Resp 20 | Ht 72.0 in | Wt 183.0 lb

## 2020-04-12 DIAGNOSIS — D689 Coagulation defect, unspecified: Secondary | ICD-10-CM | POA: Diagnosis not present

## 2020-04-12 DIAGNOSIS — I70213 Atherosclerosis of native arteries of extremities with intermittent claudication, bilateral legs: Secondary | ICD-10-CM | POA: Diagnosis not present

## 2020-04-12 DIAGNOSIS — Z992 Dependence on renal dialysis: Secondary | ICD-10-CM

## 2020-04-12 DIAGNOSIS — N186 End stage renal disease: Secondary | ICD-10-CM | POA: Diagnosis not present

## 2020-04-12 DIAGNOSIS — D631 Anemia in chronic kidney disease: Secondary | ICD-10-CM | POA: Diagnosis not present

## 2020-04-12 DIAGNOSIS — N2581 Secondary hyperparathyroidism of renal origin: Secondary | ICD-10-CM | POA: Diagnosis not present
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
## 2020-04-14 DIAGNOSIS — Z992 Dependence on renal dialysis: Secondary | ICD-10-CM | POA: Diagnosis not present

## 2020-04-14 DIAGNOSIS — N186 End stage renal disease: Secondary | ICD-10-CM | POA: Diagnosis not present

## 2020-04-14 DIAGNOSIS — N2581 Secondary hyperparathyroidism of renal origin: Secondary | ICD-10-CM | POA: Diagnosis not present

## 2020-04-14 DIAGNOSIS — D631 Anemia in chronic kidney disease: Secondary | ICD-10-CM | POA: Diagnosis not present

## 2020-04-14 DIAGNOSIS — D689 Coagulation defect, unspecified: Secondary | ICD-10-CM | POA: Diagnosis not present

## 2020-04-15 ENCOUNTER — Ambulatory Visit: Payer: Medicare Other

## 2020-04-16 DIAGNOSIS — D689 Coagulation defect, unspecified: Secondary | ICD-10-CM | POA: Diagnosis not present

## 2020-04-16 DIAGNOSIS — Z992 Dependence on renal dialysis: Secondary | ICD-10-CM | POA: Diagnosis not present

## 2020-04-16 DIAGNOSIS — N2581 Secondary hyperparathyroidism of renal origin: Secondary | ICD-10-CM | POA: Diagnosis not present

## 2020-04-16 DIAGNOSIS — N186 End stage renal disease: Secondary | ICD-10-CM | POA: Diagnosis not present

## 2020-04-16 DIAGNOSIS — D631 Anemia in chronic kidney disease: Secondary | ICD-10-CM | POA: Diagnosis not present

## 2020-04-19 DIAGNOSIS — N186 End stage renal disease: Secondary | ICD-10-CM | POA: Diagnosis not present

## 2020-04-19 DIAGNOSIS — D689 Coagulation defect, unspecified: Secondary | ICD-10-CM | POA: Diagnosis not present

## 2020-04-19 DIAGNOSIS — D631 Anemia in chronic kidney disease: Secondary | ICD-10-CM | POA: Diagnosis not present

## 2020-04-19 DIAGNOSIS — Z992 Dependence on renal dialysis: Secondary | ICD-10-CM | POA: Diagnosis not present

## 2020-04-19 DIAGNOSIS — N2581 Secondary hyperparathyroidism of renal origin: Secondary | ICD-10-CM | POA: Diagnosis not present

## 2020-04-21 DIAGNOSIS — N2581 Secondary hyperparathyroidism of renal origin: Secondary | ICD-10-CM | POA: Diagnosis not present

## 2020-04-21 DIAGNOSIS — Z992 Dependence on renal dialysis: Secondary | ICD-10-CM | POA: Diagnosis not present

## 2020-04-21 DIAGNOSIS — D631 Anemia in chronic kidney disease: Secondary | ICD-10-CM | POA: Diagnosis not present

## 2020-04-21 DIAGNOSIS — N186 End stage renal disease: Secondary | ICD-10-CM | POA: Diagnosis not present

## 2020-04-21 DIAGNOSIS — D689 Coagulation defect, unspecified: Secondary | ICD-10-CM | POA: Diagnosis not present

## 2020-04-23 DIAGNOSIS — N186 End stage renal disease: Secondary | ICD-10-CM | POA: Diagnosis not present

## 2020-04-23 DIAGNOSIS — E1122 Type 2 diabetes mellitus with diabetic chronic kidney disease: Secondary | ICD-10-CM | POA: Diagnosis not present

## 2020-04-23 DIAGNOSIS — D689 Coagulation defect, unspecified: Secondary | ICD-10-CM | POA: Diagnosis not present

## 2020-04-23 DIAGNOSIS — D631 Anemia in chronic kidney disease: Secondary | ICD-10-CM | POA: Diagnosis not present

## 2020-04-23 DIAGNOSIS — Z992 Dependence on renal dialysis: Secondary | ICD-10-CM | POA: Diagnosis not present

## 2020-04-23 DIAGNOSIS — N2581 Secondary hyperparathyroidism of renal origin: Secondary | ICD-10-CM | POA: Diagnosis not present

## 2020-04-27 ENCOUNTER — Other Ambulatory Visit: Payer: Self-pay

## 2020-04-27 ENCOUNTER — Ambulatory Visit: Payer: Medicare Other | Admitting: Internal Medicine

## 2020-04-27 ENCOUNTER — Encounter: Payer: Self-pay | Admitting: Internal Medicine

## 2020-04-27 VITALS — BP 130/72 | HR 52 | Ht 72.0 in | Wt 180.8 lb

## 2020-04-27 DIAGNOSIS — I469 Cardiac arrest, cause unspecified: Secondary | ICD-10-CM

## 2020-04-27 DIAGNOSIS — Z95810 Presence of automatic (implantable) cardiac defibrillator: Secondary | ICD-10-CM

## 2020-04-27 NOTE — Patient Instructions (Signed)
Medication Instructions:  Your physician recommends that you continue on your current medications as directed. Please refer to the Current Medication list given to you today.  Labwork: None ordered.  Testing/Procedures: None ordered.  Follow-Up: Your physician wants you to follow-up in: one year with Dr. Lovena Le. You will receive a reminder letter in the mail two months in advance. If you don't receive a letter, please call our office to schedule the follow-up appointment.  Remote monitoring is used to monitor your ICD from home. This monitoring reduces the number of office visits required to check your device to one time per year. It allows [place] to keep an eye on the functioning of your device to ensure it is working properly. You are scheduled for a device check from home on 07/15/2020. You may send your transmission at any time that day. If you have a wireless device, the transmission will be sent automatically. After your physician reviews your transmission, you will receive a postcard with your next transmission date.  Any Other Special Instructions Will Be Listed Below (If Applicable).  If you need a refill on your cardiac medications before your next appointment, please call your pharmacy.
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
## 2020-04-28 LAB — CUP PACEART INCLINIC DEVICE CHECK
Date Time Interrogation Session: 20220104134942
Implantable Lead Implant Date: 20210923
Implantable Lead Location: 753862
Implantable Lead Model: 3501
Implantable Lead Serial Number: 194255
Implantable Pulse Generator Implant Date: 20210923
Pulse Gen Serial Number: 140488

## 2020-04-29 NOTE — Addendum Note (Signed)
Addended by: Gar Ponto on: 04/29/2020 07:24 AM   Modules accepted: Orders

## 2020-05-01 ENCOUNTER — Other Ambulatory Visit (HOSPITAL_COMMUNITY): Payer: Medicare Other

## 2020-05-04 ENCOUNTER — Other Ambulatory Visit: Payer: Self-pay

## 2020-05-04 ENCOUNTER — Ambulatory Visit (HOSPITAL_COMMUNITY)
Admission: RE | Admit: 2020-05-04 | Discharge: 2020-05-04 | Disposition: A | Discharge status: Still In Hospital | Payer: Medicare Other | Attending: Surgery | Admitting: Surgery

## 2020-05-04 ENCOUNTER — Observation Stay (HOSPITAL_COMMUNITY)
Admission: EM | Admit: 2020-05-04 | Discharge: 2020-05-06 | Disposition: A | Discharge status: Home / Self Care | Payer: Medicare Other | Attending: Internal Medicine | Admitting: Internal Medicine

## 2020-05-04 ENCOUNTER — Observation Stay (HOSPITAL_BASED_OUTPATIENT_CLINIC_OR_DEPARTMENT_OTHER): Payer: Medicare Other

## 2020-05-04 ENCOUNTER — Emergency Department (HOSPITAL_COMMUNITY): Payer: Medicare Other

## 2020-05-04 DIAGNOSIS — D689 Coagulation defect, unspecified: Secondary | ICD-10-CM | POA: Diagnosis not present

## 2020-05-04 DIAGNOSIS — I361 Nonrheumatic tricuspid (valve) insufficiency: Secondary | ICD-10-CM | POA: Diagnosis not present

## 2020-05-04 DIAGNOSIS — S0230XA Fracture of orbital floor, unspecified side, initial encounter for closed fracture: Secondary | ICD-10-CM

## 2020-05-04 DIAGNOSIS — R55 Syncope and collapse: Principal | ICD-10-CM | POA: Insufficient documentation

## 2020-05-04 DIAGNOSIS — Z20822 Contact with and (suspected) exposure to covid-19: Secondary | ICD-10-CM | POA: Insufficient documentation

## 2020-05-04 DIAGNOSIS — S01112A Laceration without foreign body of left eyelid and periocular area, initial encounter: Secondary | ICD-10-CM | POA: Insufficient documentation

## 2020-05-04 DIAGNOSIS — I70219 Atherosclerosis of native arteries of extremities with intermittent claudication, unspecified extremity: Secondary | ICD-10-CM | POA: Diagnosis not present

## 2020-05-04 DIAGNOSIS — I509 Heart failure, unspecified: Secondary | ICD-10-CM | POA: Insufficient documentation

## 2020-05-04 DIAGNOSIS — W19XXXA Unspecified fall, initial encounter: Secondary | ICD-10-CM | POA: Diagnosis not present

## 2020-05-04 DIAGNOSIS — Z87891 Personal history of nicotine dependence: Secondary | ICD-10-CM | POA: Diagnosis not present

## 2020-05-04 DIAGNOSIS — Z7901 Long term (current) use of anticoagulants: Secondary | ICD-10-CM | POA: Diagnosis not present

## 2020-05-04 DIAGNOSIS — E1122 Type 2 diabetes mellitus with diabetic chronic kidney disease: Secondary | ICD-10-CM | POA: Insufficient documentation

## 2020-05-04 DIAGNOSIS — Z794 Long term (current) use of insulin: Secondary | ICD-10-CM | POA: Insufficient documentation

## 2020-05-04 DIAGNOSIS — N186 End stage renal disease: Secondary | ICD-10-CM | POA: Diagnosis not present

## 2020-05-04 DIAGNOSIS — I132 Hypertensive heart and chronic kidney disease with heart failure and with stage 5 chronic kidney disease, or end stage renal disease: Secondary | ICD-10-CM | POA: Diagnosis not present

## 2020-05-04 DIAGNOSIS — Z8673 Personal history of transient ischemic attack (TIA), and cerebral infarction without residual deficits: Secondary | ICD-10-CM | POA: Insufficient documentation

## 2020-05-04 DIAGNOSIS — Z992 Dependence on renal dialysis: Secondary | ICD-10-CM | POA: Insufficient documentation

## 2020-05-04 DIAGNOSIS — I714 Abdominal aortic aneurysm, without rupture: Secondary | ICD-10-CM | POA: Insufficient documentation

## 2020-05-04 DIAGNOSIS — Z79899 Other long term (current) drug therapy: Secondary | ICD-10-CM | POA: Diagnosis not present

## 2020-05-04 DIAGNOSIS — I2510 Atherosclerotic heart disease of native coronary artery without angina pectoris: Secondary | ICD-10-CM | POA: Insufficient documentation

## 2020-05-04 DIAGNOSIS — I25118 Atherosclerotic heart disease of native coronary artery with other forms of angina pectoris: Secondary | ICD-10-CM | POA: Diagnosis not present

## 2020-05-04 DIAGNOSIS — I480 Paroxysmal atrial fibrillation: Secondary | ICD-10-CM | POA: Diagnosis not present

## 2020-05-04 DIAGNOSIS — Z95810 Presence of automatic (implantable) cardiac defibrillator: Secondary | ICD-10-CM | POA: Diagnosis not present

## 2020-05-04 DIAGNOSIS — R0602 Shortness of breath: Secondary | ICD-10-CM | POA: Diagnosis present

## 2020-05-04 HISTORY — DX: Fracture of orbital floor, unspecified side, initial encounter for closed fracture: S02.30XA

## 2020-05-04 HISTORY — DX: Syncope and collapse: R55

## 2020-05-04 LAB — ECHOCARDIOGRAM COMPLETE
Area-P 1/2: 4.96 cm2
Height: 72 in
S' Lateral: 5.1 cm
Single Plane A4C EF: 26.8 %
Weight: 2888.91 oz

## 2020-05-04 LAB — GLUCOSE, CAPILLARY
Glucose-Capillary: 130 mg/dL — ABNORMAL HIGH (ref 70–99)
Glucose-Capillary: 165 mg/dL — ABNORMAL HIGH (ref 70–99)

## 2020-05-04 LAB — CBC WITH DIFFERENTIAL/PLATELET
Abs Immature Granulocytes: 0.04 10*3/uL (ref 0.00–0.07)
Basophils Absolute: 0.1 10*3/uL (ref 0.0–0.1)
Basophils Relative: 1 %
Eosinophils Absolute: 0.2 10*3/uL (ref 0.0–0.5)
Eosinophils Relative: 2 %
HCT: 36.3 % — ABNORMAL LOW (ref 39.0–52.0)
Hemoglobin: 11.7 g/dL — ABNORMAL LOW (ref 13.0–17.0)
Immature Granulocytes: 1 %
Lymphocytes Relative: 7 %
Lymphs Abs: 0.6 10*3/uL — ABNORMAL LOW (ref 0.7–4.0)
MCH: 30.2 pg (ref 26.0–34.0)
MCHC: 32.2 g/dL (ref 30.0–36.0)
MCV: 93.6 fL (ref 80.0–100.0)
Monocytes Absolute: 0.7 10*3/uL (ref 0.1–1.0)
Monocytes Relative: 8 %
Neutro Abs: 7 10*3/uL (ref 1.7–7.7)
Neutrophils Relative %: 81 %
Platelets: 286 10*3/uL (ref 150–400)
RBC: 3.88 MIL/uL — ABNORMAL LOW (ref 4.22–5.81)
RDW: 17.3 % — ABNORMAL HIGH (ref 11.5–15.5)
WBC: 8.5 10*3/uL (ref 4.0–10.5)
nRBC: 0 % (ref 0.0–0.2)

## 2020-05-04 LAB — BRAIN NATRIURETIC PEPTIDE: B Natriuretic Peptide: 2032 pg/mL — ABNORMAL HIGH (ref 0.0–100.0)

## 2020-05-04 LAB — BASIC METABOLIC PANEL
Anion gap: 11 (ref 5–15)
BUN: 18 mg/dL (ref 8–23)
CO2: 26 mmol/L (ref 22–32)
Calcium: 9.3 mg/dL (ref 8.9–10.3)
Chloride: 99 mmol/L (ref 98–111)
Creatinine, Ser: 3.69 mg/dL — ABNORMAL HIGH (ref 0.61–1.24)
GFR, Estimated: 17 mL/min — ABNORMAL LOW (ref >60)
Glucose, Bld: 154 mg/dL — ABNORMAL HIGH (ref 70–99)
Potassium: 3.8 mmol/L (ref 3.5–5.1)
Sodium: 136 mmol/L (ref 135–145)

## 2020-05-04 LAB — RESP PANEL BY RT-PCR (FLU A&B, COVID) ARPGX2
Influenza A by PCR: NEGATIVE
Influenza B by PCR: NEGATIVE
SARS Coronavirus 2 by RT PCR: NEGATIVE

## 2020-05-04 LAB — TROPONIN I (HIGH SENSITIVITY)
Troponin I (High Sensitivity): 28 ng/L — ABNORMAL HIGH (ref <18)
Troponin I (High Sensitivity): 25 ng/L — ABNORMAL HIGH (ref <18)

## 2020-05-04 SURGERY — ABDOMINAL AORTOGRAM W/LOWER EXTREMITY
Anesthesia: LOCAL

## 2020-05-04 MED ORDER — HYDRALAZINE HCL 25 MG PO TABS
25.0000 mg | ORAL_TABLET | ORAL | Status: DC
  Administered 2020-05-05: 25 mg via ORAL
  Filled 2020-05-04: qty 1

## 2020-05-04 MED ORDER — SODIUM CHLORIDE 0.9% FLUSH
3.0000 mL | Freq: Two times a day (BID) | INTRAVENOUS | Status: DC
  Administered 2020-05-04: 3 mL via INTRAVENOUS

## 2020-05-04 MED ORDER — INSULIN ASPART 100 UNIT/ML ~~LOC~~ SOLN: 0.0000 [IU] | Freq: Every day | SUBCUTANEOUS | Status: DC

## 2020-05-04 MED ORDER — MIDODRINE HCL 5 MG PO TABS
5.0000 mg | ORAL_TABLET | ORAL | Status: DC
Start: 2020-05-05 — End: 2020-05-06
  Filled 2020-05-04: qty 1

## 2020-05-04 MED ORDER — APIXABAN 5 MG PO TABS: 5.0000 mg | ORAL_TABLET | Freq: Two times a day (BID) | ORAL | Status: DC

## 2020-05-04 MED ORDER — ATORVASTATIN CALCIUM 40 MG PO TABS
40.0000 mg | ORAL_TABLET | Freq: Every evening | ORAL | Status: DC
Start: 2020-05-04 — End: 2020-05-06
  Administered 2020-05-04 – 2020-05-05 (×2): 40 mg via ORAL
  Filled 2020-05-04 (×2): qty 1

## 2020-05-04 MED ORDER — ALBUTEROL SULFATE HFA 108 (90 BASE) MCG/ACT IN AERS
2.0000 | INHALATION_SPRAY | Freq: Four times a day (QID) | RESPIRATORY_TRACT | Status: DC | PRN
  Filled 2020-05-04: qty 6.7

## 2020-05-04 MED ORDER — SODIUM CHLORIDE 0.9 % IV SOLN: INTRAVENOUS | Status: DC

## 2020-05-04 MED ORDER — FUROSEMIDE 10 MG/ML IJ SOLN
40.0000 mg | Freq: Once | INTRAMUSCULAR | Status: AC
  Administered 2020-05-04: 40 mg via INTRAVENOUS
  Filled 2020-05-04: qty 4

## 2020-05-04 MED ORDER — ASPIRIN 81 MG PO CHEW
81.0000 mg | CHEWABLE_TABLET | Freq: Every day | ORAL | Status: DC
  Administered 2020-05-06: 81 mg via ORAL
  Filled 2020-05-04 (×2): qty 1

## 2020-05-04 MED ORDER — ACETAMINOPHEN 325 MG PO TABS
650.0000 mg | ORAL_TABLET | Freq: Four times a day (QID) | ORAL | Status: DC | PRN
  Administered 2020-05-04: 650 mg via ORAL
  Filled 2020-05-04: qty 2

## 2020-05-04 MED ORDER — POLYETHYLENE GLYCOL 3350 17 G PO PACK: 17.0000 g | PACK | Freq: Every day | ORAL | Status: DC | PRN

## 2020-05-04 MED ORDER — AMIODARONE HCL 200 MG PO TABS
200.0000 mg | ORAL_TABLET | Freq: Every day | ORAL | Status: DC
  Administered 2020-05-06: 200 mg via ORAL
  Filled 2020-05-04 (×2): qty 1

## 2020-05-04 MED ORDER — INSULIN ASPART 100 UNIT/ML ~~LOC~~ SOLN
0.0000 [IU] | Freq: Three times a day (TID) | SUBCUTANEOUS | Status: DC
  Administered 2020-05-04: 1 [IU] via SUBCUTANEOUS

## 2020-05-04 MED ORDER — HYDRALAZINE HCL 25 MG PO TABS
25.0000 mg | ORAL_TABLET | ORAL | Status: DC
  Administered 2020-05-04 – 2020-05-06 (×2): 25 mg via ORAL
  Filled 2020-05-04 (×2): qty 1

## 2020-05-04 MED ORDER — FUROSEMIDE 10 MG/ML IJ SOLN: 40.0000 mg | Freq: Once | INTRAMUSCULAR | Status: DC

## 2020-05-04 MED ORDER — ACETAMINOPHEN 650 MG RE SUPP: 650.0000 mg | Freq: Four times a day (QID) | RECTAL | Status: DC | PRN

## 2020-05-04 NOTE — ED Notes (Signed)
MD at bedside. 
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
## 2020-05-04 NOTE — ED Notes (Signed)
Small laceration noted over left eye. Bleeding controlled.

## 2020-05-04 NOTE — ED Notes (Signed)
[employer] rep paged to come interrogate pt ICD. Waiting for rep to call this RN

## 2020-05-04 NOTE — ED Notes (Signed)
Pt transported to CT ?

## 2020-05-04 NOTE — ED Notes (Signed)
Lab called to add BNP onto lab work.
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
## 2020-05-04 NOTE — ED Notes (Signed)
Patient transported to CT 
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
## 2020-05-04 NOTE — Progress Notes (Signed)
  Echocardiogram 2D Echocardiogram has been performed.  James Chan 05/04/2020, 6:11 PM

## 2020-05-04 NOTE — Consult Note (Addendum)
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 laboratory values showing any  cause of syncope.   I personally reviewed his cath films from September 2021.  No clear PCI targets.  He has moderate disease in the left main and LAD.  Given flat troponin, would not repeat catheterization unless there is a drastic change in his ejection fraction.  Will check echocardiogram and watch on telemetry to check for arrhythmia. Had normal device function noted at EP visit in early January.  Would not anticoagulate today due to recent fall and trace hemorrhage in the sinus.  Does not sound like ACS.  Could reconsider adding anticoagualtion  With heparin tomorrow.  Would hold the Eliquis until we are certain that he does not need  another invasive procedure.    Larae Grooms

## 2020-05-04 NOTE — Progress Notes (Signed)
Renal Navigator received call from Dr. Myrtie Hawk who informed of patient's admission to [facility] at this time-awaiting Cardiology consult. Patient's HD schedule is MWF ([place]). Navigator notified Nephrologist/Dr. Jonnie Finner and L. Penninger/Renal PA. Patient's COVID test today is negative.   Alphonzo Cruise, [place] Renal Navigator [phone]

## 2020-05-04 NOTE — ED Notes (Signed)
Lunch Tray Ordered @ [id number].

## 2020-05-04 NOTE — ED Notes (Signed)
[employer] rep came and interrogated pt ICD. Rep spoke to and provided Dr. Zenia Resides report.

## 2020-05-04 NOTE — Evaluation (Signed)
Physical Therapy Evaluation Patient Details Name: James Chan MRN: [id number] DOB: 10-Jun-1950 Today's Date: 05/04/2020   History of Present Illness  70yo male who had a syncopal episode after arriving at [facility] for a scheduled vascular angiography. He felt dizzy and short of breath and then cannot remember what happened. CTH negative for acute intracranial changes, but found to have acute fracture in inferior left orbital wall with hemorrhage into maxillary sinus. Bradycardic and hypertensive in the ED. PMH ESRD on HD, CAD, ICM, hx V-tach arrest with recent ICD placement, CHF, HTN, DM, A-fib, AAA, HLD, gout, NSTEMI, hx orthostatic hypotension  Clinical Impression   Per RN, BP meds on hold and patient OK for mobility as tolerated. atient received on ED stretcher, very pleasant and cooperative but still with no memory of syncopal event. Able to mobilize on a min guard to MinA basis with one person HHA, but easily fatigued and generally unsteady, although asymptomatic with mobility. BP still elevated during session- 180s/70s at rest with drop to 160s/70s with standing activity. HR steady in the 50s with activity on room air. Left sitting at side of ED stretcher with all needs met. Would definitely benefit from skilled HHPT follow-up after DC.     Follow Up Recommendations Home health PT;Supervision for mobility/OOB    Equipment Recommendations  Rolling walker with 5" wheels    Recommendations for Other Services       Precautions / Restrictions Precautions Precautions: Fall;Other (comment) Precaution Comments: watch BP, hx of multiple syncopal episodes Restrictions Weight Bearing Restrictions: No      Mobility  Bed Mobility Overal bed mobility: Modified Independent             General bed mobility comments: from ED stretcher with HOB maximally elevated    Transfers Overall transfer level: Needs assistance Equipment used: None Transfers: Sit to/from Stand Sit to  Stand: Min guard         General transfer comment: min guard for safety, increased time and effort and mildly unsteady requiring light assist to balance  Ambulation/Gait Ambulation/Gait assistance: Min assist Gait Distance (Feet): 10 Feet Assistive device: 1 person hand held assist Gait Pattern/deviations: Step-through pattern;Decreased step length - right;Decreased step length - left;Decreased stride length;Trunk flexed Gait velocity: decreased   General Gait Details: MinA to walk 55ft in ED hallway, also performed multiple rounds of sidestepping in front of the ED cot with MinA for balance  Stairs            Wheelchair Mobility    Modified Rankin (Stroke Patients Only)       Balance Overall balance assessment: Needs assistance Sitting-balance support: No upper extremity supported;Feet supported Sitting balance-Leahy Scale: Normal     Standing balance support: During functional activity;Single extremity supported Standing balance-Leahy Scale: Poor Standing balance comment: unsteady requiring MinA for balance                             Pertinent Vitals/Pain Pain Assessment: No/denies pain    Home Living Family/patient expects to be discharged to:: Private residence Living Arrangements: Spouse/significant other Available Help at Discharge: Family;Available 24 hours/day Type of Home: House Home Access: Stairs to enter Entrance Stairs-Rails: None Entrance Stairs-Number of Steps: 3 Home Layout: Able to live on main level with bedroom/bathroom;One level Home Equipment: Cane - single point Additional Comments: has cane but doesn't really use it    Prior Function Level of Independence: Independent  Comments: hx of multiple syncopal episodes- this one and one while he was driving     Hand Dominance        Extremity/Trunk Assessment   Upper Extremity Assessment Upper Extremity Assessment: Generalized weakness    Lower Extremity  Assessment Lower Extremity Assessment: Generalized weakness    Cervical / Trunk Assessment Cervical / Trunk Assessment: Kyphotic  Communication   Communication: No difficulties  Cognition Arousal/Alertness: Awake/alert Behavior During Therapy: WFL for tasks assessed/performed Overall Cognitive Status: Within Functional Limits for tasks assessed                                 General Comments: pleasant and cooperative, although with poor insight into overall health status and current impairments. Still with no memory of what happened earlier today.      General Comments General comments (skin integrity, edema, etc.): BP elevated throughout session- 180s/70s at rest, dropped to 160s/70s in standing    Exercises     Assessment/Plan    PT Assessment Patient needs continued PT services  PT Problem List Decreased strength;Decreased knowledge of use of DME;Decreased activity tolerance;Decreased safety awareness;Decreased balance;Decreased mobility;Decreased coordination;Cardiopulmonary status limiting activity       PT Treatment Interventions DME instruction;Balance training;Gait training;Stair training;Functional mobility training;Patient/family education;Therapeutic activities;Therapeutic exercise    PT Goals (Current goals can be found in the Care Plan section)  Acute Rehab PT Goals Patient Stated Goal: go home soon if able PT Goal Formulation: With patient Time For Goal Achievement: 05/18/20 Potential to Achieve Goals: Fair    Frequency Min 3X/week   Barriers to discharge        Co-evaluation               AM-PAC PT "6 Clicks" Mobility  Outcome Measure Help needed turning from your back to your side while in a flat bed without using bedrails?: None Help needed moving from lying on your back to sitting on the side of a flat bed without using bedrails?: None Help needed moving to and from a bed to a chair (including a wheelchair)?: A Little Help  needed standing up from a chair using your arms (e.g., wheelchair or bedside chair)?: A Little Help needed to walk in hospital room?: A Little Help needed climbing 3-5 steps with a railing? : A Lot 6 Click Score: 19    End of Session   Activity Tolerance: Patient tolerated treatment well Patient left: in bed;with call bell/phone within reach (in ED cot in hallway) Nurse Communication: Mobility status PT Visit Diagnosis: Unsteadiness on feet (R26.81);Muscle weakness (generalized) (M62.81);History of falling (Z91.81);Difficulty in walking, not elsewhere classified (R26.2)    Time: 6546-5035 PT Time Calculation (min) (ACUTE ONLY): 16 min   Charges:   PT Evaluation $PT Eval Moderate Complexity: 1 Mod          Windell Norfolk, DPT, PN1   Supplemental Physical Therapist [place]    Pager [phone] Acute Rehab Office [phone]

## 2020-05-05 ENCOUNTER — Encounter (HOSPITAL_COMMUNITY): Payer: Self-pay | Admitting: Internal Medicine

## 2020-05-05 DIAGNOSIS — I4891 Unspecified atrial fibrillation: Secondary | ICD-10-CM | POA: Diagnosis not present

## 2020-05-05 DIAGNOSIS — N186 End stage renal disease: Secondary | ICD-10-CM | POA: Diagnosis not present

## 2020-05-05 DIAGNOSIS — R0602 Shortness of breath: Secondary | ICD-10-CM

## 2020-05-05 DIAGNOSIS — I739 Peripheral vascular disease, unspecified: Secondary | ICD-10-CM | POA: Diagnosis not present

## 2020-05-05 DIAGNOSIS — S0232XA Fracture of orbital floor, left side, initial encounter for closed fracture: Secondary | ICD-10-CM

## 2020-05-05 DIAGNOSIS — I25118 Atherosclerotic heart disease of native coronary artery with other forms of angina pectoris: Secondary | ICD-10-CM | POA: Diagnosis not present

## 2020-05-05 DIAGNOSIS — R55 Syncope and collapse: Secondary | ICD-10-CM | POA: Diagnosis not present

## 2020-05-05 LAB — CBC
HCT: 36.1 % — ABNORMAL LOW (ref 39.0–52.0)
Hemoglobin: 11.6 g/dL — ABNORMAL LOW (ref 13.0–17.0)
MCH: 29.9 pg (ref 26.0–34.0)
MCHC: 32.1 g/dL (ref 30.0–36.0)
MCV: 93 fL (ref 80.0–100.0)
Platelets: 280 10*3/uL (ref 150–400)
RBC: 3.88 MIL/uL — ABNORMAL LOW (ref 4.22–5.81)
RDW: 17.2 % — ABNORMAL HIGH (ref 11.5–15.5)
WBC: 8 10*3/uL (ref 4.0–10.5)
nRBC: 0 % (ref 0.0–0.2)

## 2020-05-05 LAB — COMPREHENSIVE METABOLIC PANEL
ALT: 14 U/L (ref 0–44)
AST: 13 U/L — ABNORMAL LOW (ref 15–41)
Albumin: 3.4 g/dL — ABNORMAL LOW (ref 3.5–5.0)
Alkaline Phosphatase: 78 U/L (ref 38–126)
Anion gap: 12 (ref 5–15)
BUN: 24 mg/dL — ABNORMAL HIGH (ref 8–23)
CO2: 25 mmol/L (ref 22–32)
Calcium: 9 mg/dL (ref 8.9–10.3)
Chloride: 98 mmol/L (ref 98–111)
Creatinine, Ser: 4.2 mg/dL — ABNORMAL HIGH (ref 0.61–1.24)
GFR, Estimated: 15 mL/min — ABNORMAL LOW (ref >60)
Glucose, Bld: 134 mg/dL — ABNORMAL HIGH (ref 70–99)
Potassium: 3.8 mmol/L (ref 3.5–5.1)
Sodium: 135 mmol/L (ref 135–145)
Total Bilirubin: 1.1 mg/dL (ref 0.3–1.2)
Total Protein: 6.4 g/dL — ABNORMAL LOW (ref 6.5–8.1)

## 2020-05-05 LAB — HIV ANTIBODY (ROUTINE TESTING W REFLEX): HIV Screen 4th Generation wRfx: NONREACTIVE

## 2020-05-05 LAB — GLUCOSE, CAPILLARY
Glucose-Capillary: 131 mg/dL — ABNORMAL HIGH (ref 70–99)
Glucose-Capillary: 123 mg/dL — ABNORMAL HIGH (ref 70–99)
Glucose-Capillary: 103 mg/dL — ABNORMAL HIGH (ref 70–99)
Glucose-Capillary: 156 mg/dL — ABNORMAL HIGH (ref 70–99)

## 2020-05-05 LAB — HEPATITIS B SURFACE ANTIGEN: Hepatitis B Surface Ag: NONREACTIVE

## 2020-05-05 MED ORDER — CHLORHEXIDINE GLUCONATE CLOTH 2 % EX PADS
6.0000 | MEDICATED_PAD | Freq: Every day | CUTANEOUS | Status: DC
  Administered 2020-05-05: 6 via TOPICAL

## 2020-05-05 MED ORDER — INSULIN ASPART 100 UNIT/ML ~~LOC~~ SOLN
0.0000 [IU] | Freq: Three times a day (TID) | SUBCUTANEOUS | Status: DC
  Administered 2020-05-05 – 2020-05-06 (×2): 1 [IU] via SUBCUTANEOUS

## 2020-05-05 NOTE — Progress Notes (Signed)
[facility]   HPI: 70 y/o male followed by Dr. Trula Slade. He was last seen in the office 12/20 and planned for angiogram of LE's.  The patient has significant short distance claudication and a history of multiple interventions in the past.  Ultrasound shows multiple areas of significant stenosis bilaterally.  His left leg bothers him the most.   He had a syncopal episode when he present for his angiography.  He was unconscious after fall and sustained Acute fracture inferior left orbital wall with trace hemorrhage in the left maxillary sinus.  He has been admitted for further work.  Past medical history: ESRF on HD, CAD, ICM, Hx of VT arrest w/recent ICD implant, Chronic CHF (combined), HTN, Dialysis related hypotension, DM, Paroxysmal Afib.   Subjective  - Patient sitting up at bedside.  He states his left eye area is sore.  He denise SOB, CP, amaurosis, weakness on either side of his body.      Objective (!) 171/79 (!) 53 97.7 F (36.5 C) (Oral) 18 97%  Intake/Output Summary (Last 24 hours) at 05/05/2020 0747 Last data filed at 05/05/2020 0500 Gross per 24 hour  Intake 62.83 ml  Output 700 ml  Net -637.17 ml   Non invasive studies :  Right: 75-99% stenosis noted in the deep femoral artery. 50-74% stenosis  noted in the superficial femoral artery. 30-49% stenosis noted in the  popliteal artery.   Left: 30-49% stenosis noted in the iliac segment. 30-49% stenosis noted in  the deep femoral artery. 30-49% stenosis noted in the superficial femoral  artery. 50-74% stenosis noted in the popliteal artery.  B LE feet warm and perfused without non healing wounds Palpable femoral pulses Left eye with mild ecchymosis, no vision changes Moving all 4 extremities Heart sinus brady Lungs non labored breathing A & O x 3  Assessment/Planning: Syncopal episode work up per cardiology  Symptomatic claudication Left > right with PAD stenosis  Once stable from a  cardiac point of vie we will reschedule his angiogram with DR. Cristi Loron 05/05/2020 7:47 AM --  Laboratory Lab Results: Recent Labs    05/04/20 0742 05/05/20 0357  WBC 8.5 8.0  HGB 11.7* 11.6*  HCT 36.3* 36.1*  PLT 286 280   BMET Recent Labs    05/04/20 0742 05/05/20 0357  NA 136 135  K 3.8 3.8  CL 99 98  CO2 26 25  GLUCOSE 154* 134*  BUN 18 24*  CREATININE 3.69* 4.20*  CALCIUM 9.3 9.0    COAG Lab Results  Component Value Date   INR 1.1 01/14/2020   No results found for: PTT

## 2020-05-05 NOTE — Progress Notes (Signed)
PT Cancellation Note  Patient Details Name: LIPA KNAUFF MRN: [id number] DOB: 1950/06/29   Cancelled Treatment:    Reason Eval/Treat Not Completed: Patient at procedure or test/unavailable (HD).  Wyona Almas, PT, DPT Acute Rehabilitation Services Pager [phone] Office [phone]    Deno Etienne 05/05/2020, 1:09 PM

## 2020-05-05 NOTE — Progress Notes (Addendum)
Progress Note  Patient Name: James Chan Date of Encounter: 05/05/2020  [facility] Cardiologist: Shirlee More, MD   Subjective   Sitting on the side of the bed. Just feels weak.   Inpatient Medications    Scheduled Meds: . amiodarone  200 mg Oral Daily  . aspirin  81 mg Oral Daily  . atorvastatin  40 mg Oral QPM  . Chlorhexidine Gluconate Cloth  6 each Topical Q0600  . hydrALAZINE  25 mg Oral 3 times per day on Sun Tue Thu Sat  . hydrALAZINE  25 mg Oral 2 times per day on Mon Wed Fri  . insulin aspart  0-5 Units Subcutaneous QHS  . insulin aspart  0-9 Units Subcutaneous TID WC  . midodrine  5 mg Oral Q M,W,F  . sodium chloride flush  3 mL Intravenous Q12H   Continuous Infusions: . sodium chloride 10 mL/hr at 05/04/20 1043   PRN Meds: acetaminophen **OR** acetaminophen, albuterol, polyethylene glycol   Vital Signs    Vitals:   05/04/20 1659 05/04/20 2030 05/05/20 0502 05/05/20 1118  BP: (!) 159/58 (!) 158/61 (!) 171/79 (!) 183/63  Pulse: (!) 51 (!) 50 (!) 53 (!) 57  Resp: $Remo'18 16 18 18  'uUrHg$ Temp: 97.7 F (36.5 C) 98.2 F (36.8 C) 97.7 F (36.5 C) 97.7 F (36.5 C)  TempSrc: Oral Oral Oral Oral  SpO2: 99% 99% 97% 100%  Weight: 81.9 kg  81.7 kg   Height: 6' (1.829 m)       Intake/Output Summary (Last 24 hours) at 05/05/2020 1207 Last data filed at 05/05/2020 0500 Gross per 24 hour  Intake 62.83 ml  Output 700 ml  Net -637.17 ml   Last 3 Weights 05/05/2020 05/04/2020 05/04/2020  Weight (lbs) 180 lb 3.2 oz 180 lb 8.9 oz 181 lb  Weight (kg) 81.738 kg 81.9 kg 82.101 kg      Telemetry    SB - Personally Reviewed  ECG    No new tracing - Personally Reviewed  Physical Exam  Pleasant older male GEN: No acute distress. Lac to left eyebrow with dermabond Neck: No JVD Cardiac: RRR, no murmurs, rubs, or gallops.  Respiratory: Clear to auscultation bilaterally. GI: Soft, nontender, non-distended  MS: No edema; No deformity. Neuro:  Nonfocal  Psych: Normal  affect   Labs    High Sensitivity Troponin:   Recent Labs  Lab 05/04/20 0742 05/04/20 1047  TROPONINIHS 28* 25*      Chemistry Recent Labs  Lab 05/04/20 0742 05/05/20 0357  NA 136 135  K 3.8 3.8  CL 99 98  CO2 26 25  GLUCOSE 154* 134*  BUN 18 24*  CREATININE 3.69* 4.20*  CALCIUM 9.3 9.0  PROT  --  6.4*  ALBUMIN  --  3.4*  AST  --  13*  ALT  --  14  ALKPHOS  --  78  BILITOT  --  1.1  GFRNONAA 17* 15*  ANIONGAP 11 12     Hematology Recent Labs  Lab 05/04/20 0742 05/05/20 0357  WBC 8.5 8.0  RBC 3.88* 3.88*  HGB 11.7* 11.6*  HCT 36.3* 36.1*  MCV 93.6 93.0  MCH 30.2 29.9  MCHC 32.2 32.1  RDW 17.3* 17.2*  PLT 286 280    BNP Recent Labs  Lab 05/04/20 0913  BNP 2,032.0*     DDimer No results for input(s): DDIMER in the last 168 hours.   Radiology    DG Chest 2 View  Result Date: 05/04/2020  CLINICAL DATA:  70 year old male with shortness of breath EXAM: CHEST - 2 VIEW COMPARISON:  02/02/2020 FINDINGS: Cardiomediastinal silhouette unchanged in size and contour. External AICD is unchanged. Evidence of coronary artery disease/prior PTC I No pneumothorax. Meniscus on the lateral view at the posterior lung base. Mild interlobular septal thickening. IMPRESSION: Acute CHF with small bilateral pleural effusions. Unchanged external AICD Electronically Signed   By: Corrie Mckusick D.O.   On: 05/04/2020 08:36   CT Head Wo Contrast  Result Date: 05/04/2020 CLINICAL DATA:  Head injury, syncope. EXAM: CT HEAD WITHOUT CONTRAST TECHNIQUE: Contiguous axial images were obtained from the base of the skull through the vertex without intravenous contrast. COMPARISON:  January 14, 2020. FINDINGS: Brain: Mild chronic ischemic white matter disease is noted. Mild diffuse cortical atrophy is noted. Old right cerebellar infarction is noted. Old left frontal infarction is noted. No mass effect or midline shift is noted. Ventricular size is within normal limits. There is no evidence of  mass lesion, hemorrhage or acute infarction. Vascular: No hyperdense vessel or unexpected calcification. Skull: Normal. Negative for fracture or focal lesion. Sinuses/Orbits: Small amount of fluid is seen posteriorly in the left maxillary sinus and there is the suggestion of a probable blowout fracture involving the posterior portion of the left orbital floor. Left ethmoid sinusitis is noted. Other: Soft tissue gas is seen overlying the left infraorbital rim suggesting traumatic injury. IMPRESSION: 1. Mild chronic ischemic white matter disease. Mild diffuse cortical atrophy. 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 No acute intracranial abnormality seen. 2. Soft tissue gas is seen overlying the left infraorbital rim suggesting traumatic injury. Probable blowout fracture is seen involving the posterior portion of the left orbital floor. CT scan of the maxillofacial region is recommended for further evaluation. These results will be called to the ordering clinician or representative by the Radiologist Assistant, and communication documented in the PACS or zVision Dashboard. Electronically Signed   By: Marijo Conception M.D.   On: 05/04/2020 08:22   ECHOCARDIOGRAM COMPLETE  Result Date: 05/04/2020    ECHOCARDIOGRAM REPORT   Patient Name:   James Chan Date of Exam: 05/04/2020 Medical Rec #:  [id number]        Height:       72.0 in Accession #:    [id number]       Weight:       181.0 lb Date of Birth:  1950/12/14       BSA:          2.042 m Patient Age:    27 years         BP:           159/58 mmHg Patient Gender: M                HR:           53 bpm. Exam Location:  Inpatient Procedure: 2D Echo Indications:    syncope  History:        Patient has prior history of Echocardiogram examinations, most                 recent 01/15/2020. CHF, Defibrillator, chronic kidney disease;                 Risk Factors:Dyslipidemia and Diabetes.  Sonographer:    Johny Chess Referring Phys: [place]  1. Left ventricular ejection fraction, by estimation, is 25%. The left ventricle has severely decreased function. The left ventricle demonstrates  global hypokinesis with inferolateral akinesis. The left ventricular internal cavity size was mildly dilated. There is mild left ventricular hypertrophy. Left ventricular diastolic parameters are consistent with Grade III diastolic dysfunction (restrictive).  2. Right ventricular systolic function is mildly reduced. The right ventricular size is normal. There is severely elevated pulmonary artery systolic pressure. The estimated right ventricular systolic pressure is 76.2 mmHg.  3. Left atrial size was moderately dilated.  4. Right atrial size was mildly dilated.  5. The mitral valve is normal in structure. Trivial mitral valve regurgitation. No evidence of mitral stenosis.  6. The aortic valve is tricuspid. Aortic valve regurgitation is trivial. Mild aortic valve sclerosis is present, with no evidence of aortic valve stenosis.  7. The inferior vena cava is normal in size with <50% respiratory variability, suggesting right atrial pressure of 8 mmHg. FINDINGS  Left Ventricle: Left ventricular ejection fraction, by estimation, is 25%. The left ventricle has severely decreased function. The left ventricle demonstrates global hypokinesis. The left ventricular internal cavity size was mildly dilated. There is mild left ventricular hypertrophy. Left ventricular diastolic parameters are consistent with Grade III diastolic dysfunction (restrictive). Right Ventricle: The right ventricular size is normal. No increase in right ventricular wall thickness. Right ventricular systolic function is mildly reduced. There is severely elevated pulmonary artery systolic pressure. The tricuspid regurgitant velocity is 4.04 m/s, and with an assumed right atrial pressure of 8 mmHg, the estimated right ventricular systolic pressure is 83.1 mmHg. Left Atrium: Left atrial size was  moderately dilated. Right Atrium: Right atrial size was mildly dilated. Pericardium: Trivial pericardial effusion is present. Mitral Valve: The mitral valve is normal in structure. Trivial mitral valve regurgitation. No evidence of mitral valve stenosis. Tricuspid Valve: The tricuspid valve is normal in structure. Tricuspid valve regurgitation is mild. Aortic Valve: The aortic valve is tricuspid. Aortic valve regurgitation is trivial. Mild aortic valve sclerosis is present, with no evidence of aortic valve stenosis. Pulmonic Valve: The pulmonic valve was normal in structure. Pulmonic valve regurgitation is trivial. Aorta: The aortic root is normal in size and structure. Venous: The inferior vena cava is normal in size with less than 50% respiratory variability, suggesting right atrial pressure of 8 mmHg. IAS/Shunts: No atrial level shunt detected by color flow Doppler.  LEFT VENTRICLE PLAX 2D LVIDd:         5.70 cm      Diastology LVIDs:         5.10 cm      LV e' medial:    2.82 cm/s LV PW:         1.10 cm      LV E/e' medial:  39.4 LV IVS:        1.30 cm      LV e' lateral:   5.57 cm/s                             LV E/e' lateral: 19.9  LV Volumes (MOD) LV vol d, MOD A4C: 213.0 ml LV vol s, MOD A4C: 156.0 ml LV SV MOD A4C:     213.0 ml RIGHT VENTRICLE            IVC RV S prime:     7.63 cm/s  IVC diam: 1.80 cm TAPSE (M-mode): 1.6 cm LEFT ATRIUM              Index       RIGHT ATRIUM  Index LA diam:        4.50 cm  2.20 cm/m  RA Area:     21.60 cm LA Vol (A2C):   102.0 ml 49.95 ml/m RA Volume:   73.90 ml  36.19 ml/m LA Vol (A4C):   93.5 ml  45.79 ml/m LA Biplane Vol: 101.0 ml 49.46 ml/m  AORTIC VALVE LVOT Vmax:   67.20 cm/s LVOT Vmean:  43.900 cm/s LVOT VTI:    0.168 m  AORTA Ao Asc diam: 3.60 cm MITRAL VALVE                TRICUSPID VALVE MV Area (PHT): 4.96 cm     TR Peak grad:   65.3 mmHg MV Decel Time: 153 msec     TR Vmax:        404.00 cm/s MV E velocity: 111.00 cm/s MV A velocity: 43.20 cm/s    SHUNTS MV E/A ratio:  2.57         Systemic VTI: 0.17 m Loralie Champagne MD Electronically signed by Loralie Champagne MD Signature Date/Time: 05/04/2020/6:49:17 PM    Final    CT Maxillofacial WO CM  Result Date: 05/04/2020 CLINICAL DATA:  Fall. Facial trauma. Laceration above the left orbit. EXAM: CT MAXILLOFACIAL WITHOUT CONTRAST TECHNIQUE: Multidetector CT imaging of the maxillofacial structures was performed. Multiplanar CT image reconstructions were also generated. COMPARISON:  None. FINDINGS: Osseous: Acute fracture identified inferior left orbital wall. Trace hemorrhage noted left maxillary sinus. Slight deformity of the nasal bones evident, likely related to remote trauma without substantial displacement. No evidence for zygomatic arch fracture. Anterior and inferior walls of the left maxillary sinus are intact. No evidence for mandible fracture. Mild subluxation of the bilateral temporomandibular joints likely positional. Orbits: Trace gas identified inferior left orbit, compatible with above described fracture. Sinuses: Subtle chronic mucosal thickening noted paranasal sinuses with air-fluid level left maxillary sinus suggesting hemorrhage. Soft tissues: Gas in the soft tissues overlying the left orbital region would be compatible with reported clinical history of laceration. Limited intracranial: Incomplete visualization old right occipital lobe infarct. IMPRESSION: 1. Acute fracture inferior left orbital wall with trace hemorrhage in the left maxillary sinus. 2. Gas in the soft tissues overlying the left orbital region would be compatible with reported clinical history of laceration. Electronically Signed   By: Misty Stanley M.D.   On: 05/04/2020 09:48    Cardiac Studies   Echo: 05/04/20  IMPRESSIONS    1. Left ventricular ejection fraction, by estimation, is 25%. The left  ventricle has severely decreased function. The left ventricle demonstrates  global hypokinesis with inferolateral akinesis.  The left ventricular  internal cavity size was mildly  dilated. There is mild left ventricular hypertrophy. Left ventricular  diastolic parameters are consistent with Grade III diastolic dysfunction  (restrictive).  2. Right ventricular systolic function is mildly reduced. The right  ventricular size is normal. There is severely elevated pulmonary artery  systolic pressure. The estimated right ventricular systolic pressure is  62.9 mmHg.  3. Left atrial size was moderately dilated.  4. Right atrial size was mildly dilated.  5. The mitral valve is normal in structure. Trivial mitral valve  regurgitation. No evidence of mitral stenosis.  6. The aortic valve is tricuspid. Aortic valve regurgitation is trivial.  Mild aortic valve sclerosis is present, with no evidence of aortic valve  stenosis.  7. The inferior vena cava is normal in size with <50% respiratory  variability, suggesting right atrial pressure of 8 mmHg.  Patient Profile     70 y.o. male a hx of ESRD on HD, PVD, hypertensive heart disease, CAD, PAF, hx of PEA/Vfib arrest with ICD now implanted, chronic anticoagulation, ischemic cardiomyopathy with chronic systolic and diastolic heart failure,  who was seen for the evaluation of syncope and CHF at the request of Dr. Heber [place].  Assessment & Plan    1. Syncope with left orbital wall fx: Unfortunately, ICD does not have the capabilities to show bradycardia or pauses, only tachycardic and treated events. hs troponin mildly elevated and flat consistent with demand in the setting of ESRD. Unclear if a rhythm disturbance (Vtach below threshold of treatment) or a pause precipitated his event. No events noted on telemetry overnight. Echo this admission with EF noted at 25%, but images reviewed from echo back 9/21 looks similar. Suspect EF was lower than 40% at that time.  2. Hx of Vtach/Vfib arrest with Subq ICD in place: device interrogated yesterday with no shocks or treated  events.   3. CAD: chronic troponin leak in the setting of ESRD. Recent heart cath without medical therapy recommended - chronic total occlusion of RCA and distal LCx. 50% left main -- no ASA in the setting of eliquis -- chest tightness prior to LOC, also reports chest tightness when he lies flat. Seems this is unchanged from  -- will review with MD, do not anticipate further ischemic work up at this  4. Ischemic cardiomyopathy/Chronic systolic and diastolic heart failure: volume status has been managed by nephrology and HD  5. Symptomatic claudication: followed by Dr. Trula Slade. Plan to reschedule angiogram per [facility]  6. PAF: in SR on admission. Eliquis was held during work up. Suspect this can be resumed. Review with MD  For questions or updates, please contact [place] Please consult [email or web address] for contact info under        Signed, Reino Bellis, NP  05/05/2020, 12:07 PM    I have examined the patient and reviewed assessment and plan and discussed with patient.  Agree with above as stated.  I personally reviewed the echo images from 9/21 and 1/22.  No significant difference in systolic function and wall motion.  No need for cath at this time.  He denies any chest discomfort.  Unclear what caused his episode yesterday.  Will make sure there was no arrhythmia noted on SQ ICD.    No further cardiac w/u planned.  OK to restart Eliquis from acardiac standpoint.   Larae Grooms

## 2020-05-05 NOTE — Progress Notes (Signed)
HD#0 Subjective:   Admitted yesterday. No acute events overnight  Upon bedside evaluation, he notes feeling well this morning. He denies chest pain. He continues to have mild dizziness. Denies lower extremity edema. Does he some soreness where he fell and hit his head, denies headache .  Notes he is able to tell when he is in afib but has not felt that way in the last day. Of significance, he also notes being on dialysis over the last year and at this time his torsemide was discontinued  Discussed results of echocardiogram and worsening of heart function. Defibrillator placed last September  Objective:  Vital signs in last 24 hours: Vitals:   05/04/20 1643 05/04/20 1659 05/04/20 2030 05/05/20 0502  BP:  (!) 159/58 (!) 158/61 (!) 171/79  Pulse:  (!) 51 (!) 50 (!) 53  Resp:  $Remo'18 16 18  'tWgGS$ Temp: 98.3 F (36.8 C) 97.7 F (36.5 C) 98.2 F (36.8 C) 97.7 F (36.5 C)  TempSrc: Oral Oral Oral Oral  SpO2:  99% 99% 97%  Weight:  81.9 kg  81.7 kg  Height:  6' (1.829 m)     Supplemental O2: Room Air SpO2: 97 %  Physical Exam:  Physical Exam Constitutional:      General: He is not in acute distress.    Appearance: Normal appearance. He is not toxic-appearing.  Cardiovascular:     Rate and Rhythm: Regular rhythm. Bradycardia present.     Heart sounds: Murmur (2/5 systolic murmur) heard.    Pulmonary:     Effort: Pulmonary effort is normal. No respiratory distress.     Breath sounds: Normal breath sounds. No stridor. No wheezing, rhonchi or rales.  Abdominal:     Tenderness: There is no abdominal tenderness.  Musculoskeletal:     Right lower leg: No edema.     Left lower leg: No edema.  Skin:    General: Skin is warm and dry.  Neurological:     General: No focal deficit present.     Mental Status: He is alert and oriented to person, place, and time. Mental status is at baseline.  Psychiatric:        Mood and Affect: Mood normal.        Behavior: Behavior normal.      Filed Weights   05/04/20 0711 05/04/20 1659 05/05/20 0502  Weight: 82.1 kg 81.9 kg 81.7 kg     Intake/Output Summary (Last 24 hours) at 05/05/2020 0603 Last data filed at 05/05/2020 0500 Gross per 24 hour  Intake 62.83 ml  Output 700 ml  Net -637.17 ml   Net IO Since Admission: -637.17 mL [05/05/20 0603]  Pertinent Labs: CBC Latest Ref Rng & Units 05/05/2020 05/04/2020 02/02/2020  WBC 4.0 - 10.5 K/uL 8.0 8.5 10.9(H)  Hemoglobin 13.0 - 17.0 g/dL 11.6(L) 11.7(L) 8.2(L)  Hematocrit 39.0 - 52.0 % 36.1(L) 36.3(L) 25.5(L)  Platelets 150 - 400 K/uL 280 286 300    CMP Latest Ref Rng & Units 05/05/2020 05/04/2020 02/02/2020  Glucose 70 - 99 mg/dL 134(H) 154(H) 224(H)  BUN 8 - 23 mg/dL 24(H) 18 13  Creatinine 0.61 - 1.24 mg/dL 4.20(H) 3.69(H) 2.96(H)  Sodium 135 - 145 mmol/L 135 136 134(L)  Potassium 3.5 - 5.1 mmol/L 3.8 3.8 4.1  Chloride 98 - 111 mmol/L 98 99 94(L)  CO2 22 - 32 mmol/L $RemoveB'25 26 27  'YnoNroDa$ Calcium 8.9 - 10.3 mg/dL 9.0 9.3 8.2(L)  Total Protein 6.5 - 8.1 g/dL 6.4(L) - 6.6  Total  Bilirubin 0.3 - 1.2 mg/dL 1.1 - 1.8(H)  Alkaline Phos 38 - 126 U/L 78 - 90  AST 15 - 41 U/L 13(L) - 32  ALT 0 - 44 U/L 14 - 9    Imaging: DG Chest 2 View  Result Date: 05/04/2020 CLINICAL DATA:  70 year old male with shortness of breath EXAM: CHEST - 2 VIEW COMPARISON:  02/02/2020 FINDINGS: Cardiomediastinal silhouette unchanged in size and contour. External AICD is unchanged. Evidence of coronary artery disease/prior PTC I No pneumothorax. Meniscus on the lateral view at the posterior lung base. Mild interlobular septal thickening. IMPRESSION: Acute CHF with small bilateral pleural effusions. Unchanged external AICD Electronically Signed   By: Corrie Mckusick D.O.   On: 05/04/2020 08:36   CT Head Wo Contrast  Result Date: 05/04/2020 CLINICAL DATA:  Head injury, syncope. EXAM: CT HEAD WITHOUT CONTRAST TECHNIQUE: Contiguous axial images were obtained from the base of the skull through the vertex  without intravenous contrast. COMPARISON:  January 14, 2020. FINDINGS: Brain: Mild chronic ischemic white matter disease is noted. Mild diffuse cortical atrophy is noted. Old right cerebellar infarction is noted. Old left frontal infarction is noted. No mass effect or midline shift is noted. Ventricular size is within normal limits. There is no evidence of mass lesion, hemorrhage or acute infarction. Vascular: No hyperdense vessel or unexpected calcification. Skull: Normal. Negative for fracture or focal lesion. Sinuses/Orbits: Small amount of fluid is seen posteriorly in the left maxillary sinus and there is the suggestion of a probable blowout fracture involving the posterior portion of the left orbital floor. Left ethmoid sinusitis is noted. Other: Soft tissue gas is seen overlying the left infraorbital rim suggesting traumatic injury. IMPRESSION: 1. Mild chronic ischemic white matter disease. Mild diffuse cortical atrophy. 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 No acute intracranial abnormality seen. 2. Soft tissue gas is seen overlying the left infraorbital rim suggesting traumatic injury. Probable blowout fracture is seen involving the posterior portion of the left orbital floor. CT scan of the maxillofacial region is recommended for further evaluation. These results will be called to the ordering clinician or representative by the Radiologist Assistant, and communication documented in the PACS or zVision Dashboard. Electronically Signed   By: Marijo Conception M.D.   On: 05/04/2020 08:22   ECHOCARDIOGRAM COMPLETE  Result Date: 05/04/2020    ECHOCARDIOGRAM REPORT   Patient Name:   FLEMING PRILL Date of Exam: 05/04/2020 Medical Rec #:  [id number]        Height:       72.0 in Accession #:    [id number]       Weight:       181.0 lb Date of Birth:  11/09/1950       BSA:          2.042 m Patient Age:    70 years         BP:           159/58 mmHg Patient Gender: M                HR:           53 bpm.  Exam Location:  Inpatient Procedure: 2D Echo Indications:    syncope  History:        Patient has prior history of Echocardiogram examinations, most                 recent 01/15/2020. CHF, Defibrillator, chronic kidney disease;  Risk Factors:Dyslipidemia and Diabetes.  Sonographer:    Johny Chess Referring Phys: [place]  1. Left ventricular ejection fraction, by estimation, is 25%. The left ventricle has severely decreased function. The left ventricle demonstrates global hypokinesis with inferolateral akinesis. The left ventricular internal cavity size was mildly dilated. There is mild left ventricular hypertrophy. Left ventricular diastolic parameters are consistent with Grade III diastolic dysfunction (restrictive).  2. Right ventricular systolic function is mildly reduced. The right ventricular size is normal. There is severely elevated pulmonary artery systolic pressure. The estimated right ventricular systolic pressure is 88.4 mmHg.  3. Left atrial size was moderately dilated.  4. Right atrial size was mildly dilated.  5. The mitral valve is normal in structure. Trivial mitral valve regurgitation. No evidence of mitral stenosis.  6. The aortic valve is tricuspid. Aortic valve regurgitation is trivial. Mild aortic valve sclerosis is present, with no evidence of aortic valve stenosis.  7. The inferior vena cava is normal in size with <50% respiratory variability, suggesting right atrial pressure of 8 mmHg. FINDINGS  Left Ventricle: Left ventricular ejection fraction, by estimation, is 25%. The left ventricle has severely decreased function. The left ventricle demonstrates global hypokinesis. The left ventricular internal cavity size was mildly dilated. There is mild left ventricular hypertrophy. Left ventricular diastolic parameters are consistent with Grade III diastolic dysfunction (restrictive). Right Ventricle: The right ventricular size is normal. No increase in  right ventricular wall thickness. Right ventricular systolic function is mildly reduced. There is severely elevated pulmonary artery systolic pressure. The tricuspid regurgitant velocity is 4.04 m/s, and with an assumed right atrial pressure of 8 mmHg, the estimated right ventricular systolic pressure is 16.6 mmHg. Left Atrium: Left atrial size was moderately dilated. Right Atrium: Right atrial size was mildly dilated. Pericardium: Trivial pericardial effusion is present. Mitral Valve: The mitral valve is normal in structure. Trivial mitral valve regurgitation. No evidence of mitral valve stenosis. Tricuspid Valve: The tricuspid valve is normal in structure. Tricuspid valve regurgitation is mild. Aortic Valve: The aortic valve is tricuspid. Aortic valve regurgitation is trivial. Mild aortic valve sclerosis is present, with no evidence of aortic valve stenosis. Pulmonic Valve: The pulmonic valve was normal in structure. Pulmonic valve regurgitation is trivial. Aorta: The aortic root is normal in size and structure. Venous: The inferior vena cava is normal in size with less than 50% respiratory variability, suggesting right atrial pressure of 8 mmHg. IAS/Shunts: No atrial level shunt detected by color flow Doppler.  LEFT VENTRICLE PLAX 2D LVIDd:         5.70 cm      Diastology LVIDs:         5.10 cm      LV e' medial:    2.82 cm/s LV PW:         1.10 cm      LV E/e' medial:  39.4 LV IVS:        1.30 cm      LV e' lateral:   5.57 cm/s                             LV E/e' lateral: 19.9  LV Volumes (MOD) LV vol d, MOD A4C: 213.0 ml LV vol s, MOD A4C: 156.0 ml LV SV MOD A4C:     213.0 ml RIGHT VENTRICLE            IVC RV S prime:     7.63  cm/s  IVC diam: 1.80 cm TAPSE (M-mode): 1.6 cm LEFT ATRIUM              Index       RIGHT ATRIUM           Index LA diam:        4.50 cm  2.20 cm/m  RA Area:     21.60 cm LA Vol (A2C):   102.0 ml 49.95 ml/m RA Volume:   73.90 ml  36.19 ml/m LA Vol (A4C):   93.5 ml  45.79 ml/m LA  Biplane Vol: 101.0 ml 49.46 ml/m  AORTIC VALVE LVOT Vmax:   67.20 cm/s LVOT Vmean:  43.900 cm/s LVOT VTI:    0.168 m  AORTA Ao Asc diam: 3.60 cm MITRAL VALVE                TRICUSPID VALVE MV Area (PHT): 4.96 cm     TR Peak grad:   65.3 mmHg MV Decel Time: 153 msec     TR Vmax:        404.00 cm/s MV E velocity: 111.00 cm/s MV A velocity: 43.20 cm/s   SHUNTS MV E/A ratio:  2.57         Systemic VTI: 0.17 m Loralie Champagne MD Electronically signed by Loralie Champagne MD Signature Date/Time: 05/04/2020/6:49:17 PM    Final    CT Maxillofacial WO CM  Result Date: 05/04/2020 CLINICAL DATA:  Fall. Facial trauma. Laceration above the left orbit. EXAM: CT MAXILLOFACIAL WITHOUT CONTRAST TECHNIQUE: Multidetector CT imaging of the maxillofacial structures was performed. Multiplanar CT image reconstructions were also generated. COMPARISON:  None. FINDINGS: Osseous: Acute fracture identified inferior left orbital wall. Trace hemorrhage noted left maxillary sinus. Slight deformity of the nasal bones evident, likely related to remote trauma without substantial displacement. No evidence for zygomatic arch fracture. Anterior and inferior walls of the left maxillary sinus are intact. No evidence for mandible fracture. Mild subluxation of the bilateral temporomandibular joints likely positional. Orbits: Trace gas identified inferior left orbit, compatible with above described fracture. Sinuses: Subtle chronic mucosal thickening noted paranasal sinuses with air-fluid level left maxillary sinus suggesting hemorrhage. Soft tissues: Gas in the soft tissues overlying the left orbital region would be compatible with reported clinical history of laceration. Limited intracranial: Incomplete visualization old right occipital lobe infarct. IMPRESSION: 1. Acute fracture inferior left orbital wall with trace hemorrhage in the left maxillary sinus. 2. Gas in the soft tissues overlying the left orbital region would be compatible with reported  clinical history of laceration. Electronically Signed   By: Misty Stanley M.D.   On: 05/04/2020 09:48    Assessment/Plan:   Active Problems:   Syncope   Patient Summary: James Chan is a 70 y.o.  male with past medical history of ESRF on HD, CAD, ICM, Hx of VT arrest w/recent ICD implant, Chronic CHF (combined), HTN, Dialysis related hypotension, DM, Paroxysmal Afib, who presented to ED after syncopal episode.  Syncopal episode Left Orbital Fracture Patient notes improvement of shortness of breath. Still endorses minimal dizziness. Echocardiogram performed, noted to have decrease LVEF from 40% to 25%. Now with diffuse hypokinesis. Pending cardiology recommendations and if they believe patient would benefit from invasive procedure while admitted. -cardiac monitroing -continue to hold BB and CC blockers in context of symptomatic bradycardia -cardiac monitoring -orthostatic vital signs -BMP and CBC daily -strict intake and output -patient did not endorse visual changes, recommend outpatient opthalmology follow up for orbital fracture.  Paroxysmal A.  Fib: On amiodarone and Eliquis at home.  -amiodarone resumed yesterday, eliquis pending  -ICD interrogated in ED and did not show any defibrillator firing. -Cardiac monitoring  ESRD on HD MWT HD today per nephro.  -daily bmp  CAD Hx of V. fib arrest status post ICD insertion Thus far, ischemic workup has been unremarkable. Electrolytes stable, trended troponin's without elevation. Hemoglobin stable.  -Cardiology following, awaiting recommendations for further workup.  -start heparin if no invasive procedure today  PAD:  LE angiography canceled today due to syncopal episode -follow up outpatient to reschedule  Normocytic Anemia. Hgb of 11.6 MCV of 93. Chronic and suspect secondary to CKD.  ESA Q 4 weeks  Diet: Carb-Modified IVF: None,None VTE: SCDs Code: Full PT/OT recs: OT recommend tub/shower seat  Dispo:  Anticipated discharge to Home in 1-2 days pending further cardiac workup.   Sanjuana Letters DO Internal Medicine Resident PGY-1 Pager [phone] Please contact the on call pager after 5 pm and on weekends at [phone].
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
## 2020-05-05 NOTE — Procedures (Signed)
   I was present at this dialysis session, have reviewed the session itself and made  appropriate changes [name] MD [place] pager [phone]   05/05/2020, 2:00 PM

## 2020-05-05 NOTE — Evaluation (Signed)
Occupational Therapy Evaluation Patient Details Name: James Chan MRN: [id number] DOB: [birth year] Today's Date: 05/05/2020    History of Present Illness 70yo male who had a syncopal episode after arriving at [facility] for a scheduled vascular angiography. He felt dizzy and short of breath and then cannot remember what happened. CTH negative for acute intracranial changes, but found to have acute fracture in inferior left orbital wall with hemorrhage into maxillary sinus. Bradycardic and hypertensive in the ED. PMH ESRD on HD, CAD, ICM, hx V-tach arrest with recent ICD placement, CHF, HTN, DM, A-fib, AAA, HLD, gout, NSTEMI, hx orthostatic hypotension   Clinical Impression   Patient admitted for the diagnosis above.  His only complaint is decreased activity tolerance.  He states he just feels as if he's going to give out.  Mild unsteadiness noted.  He plans on returning home, but is curious if the medical team will be doing the planned stents to his L lower leg, or if he'll be going home first.  He is needing only supervision for functional mobility and lower body ADL from a sit/stand position.  OT recommends home when medically stable, but OT will follow him in the acute setting.      Follow Up Recommendations  No OT follow up    Equipment Recommendations  Tub/shower seat    Recommendations for Other Services       Precautions / Restrictions Precautions Precautions: Fall Restrictions Weight Bearing Restrictions: No      Mobility Bed Mobility                    Transfers Overall transfer level: Needs assistance Equipment used: None Transfers: Sit to/from Stand Sit to Stand: Supervision              Balance   Sitting-balance support: No upper extremity supported;Feet supported Sitting balance-Leahy Scale: Normal     Standing balance support: No upper extremity supported Standing balance-Leahy Scale: Fair                             ADL  either performed or assessed with clinical judgement   ADL Overall ADL's : Needs assistance/impaired Eating/Feeding: Independent   Grooming: Wash/dry hands;Wash/dry face;Supervision/safety;Standing   Upper Body Bathing: Supervision/ safety;Sitting;Standing       Upper Body Dressing : Sitting;Independent   Lower Body Dressing: Supervision/safety;Sit to/from stand   Toilet Transfer: Supervision/safety;Ambulation   Toileting- Clothing Manipulation and Hygiene: Independent       Functional mobility during ADLs: Supervision/safety General ADL Comments: no AD     Vision Baseline Vision/History: Wears glasses Wears Glasses: At all times Patient Visual Report: No change from baseline       Perception     Praxis      Pertinent Vitals/Pain Pain Assessment: Faces Faces Pain Scale: Hurts a little bit Pain Location: eye Pain Descriptors / Indicators: Tender Pain Intervention(s): Monitored during session     Hand Dominance     Extremity/Trunk Assessment Upper Extremity Assessment Upper Extremity Assessment: Generalized weakness   Lower Extremity Assessment Lower Extremity Assessment: Defer to PT evaluation       Communication Communication Communication: No difficulties   Cognition Arousal/Alertness: Awake/alert Behavior During Therapy: WFL for tasks assessed/performed Overall Cognitive Status: Within Functional Limits for tasks assessed  Home Living Family/patient expects to be discharged to:: Private residence Living Arrangements: Spouse/significant other Available Help at Discharge: Family;Available 24 hours/day Type of Home: House Home Access: Stairs to enter     Home Layout: Able to live on main level with bedroom/bathroom;One level     Bathroom Shower/Tub: Teacher, early years/pre: Standard     Home Equipment: Cane - single point          Prior Functioning/Environment Level of  Independence: Independent                 OT Problem List: Decreased activity tolerance;Impaired balance (sitting and/or standing)      OT Treatment/Interventions: Self-care/ADL training;Therapeutic exercise;Balance training;Therapeutic activities    OT Goals(Current goals can be found in the care plan section) Acute Rehab OT Goals Patient Stated Goal: Home today if possible OT Goal Formulation: With patient Time For Goal Achievement: 05/19/20 Potential to Achieve Goals: Good ADL Goals Pt Will Perform Grooming: Independently Pt Will Perform Lower Body Bathing: Independently Pt Will Perform Lower Body Dressing: Independently Pt Will Transfer to Toilet: Independently  OT Frequency: Min 2X/week   Barriers to D/C:    none       Co-evaluation              AM-PAC OT "6 Clicks" Daily Activity     Outcome Measure Help from another person eating meals?: None Help from another person taking care of personal grooming?: A Little Help from another person toileting, which includes using toliet, bedpan, or urinal?: A Little Help from another person bathing (including washing, rinsing, drying)?: A Little Help from another person to put on and taking off regular upper body clothing?: None Help from another person to put on and taking off regular lower body clothing?: A Little 6 Click Score: 20   End of Session Equipment Utilized During Treatment: Gait belt Nurse Communication: Mobility status  Activity Tolerance: Patient tolerated treatment well Patient left: in chair;with call bell/phone within reach;with chair alarm set  OT Visit Diagnosis: Unsteadiness on feet (R26.81)                Time: 9147-8295 OT Time Calculation (min): 17 min Charges:  OT General Charges $OT Visit: 1 Visit OT Evaluation $OT Eval Moderate Complexity: 1 Mod  05/05/2020  James Chan, OTR/L  Acute Rehabilitation Services  Office:  [phone]   James Chan 05/05/2020, 9:16 AM

## 2020-05-06 DIAGNOSIS — Z7901 Long term (current) use of anticoagulants: Secondary | ICD-10-CM

## 2020-05-06 DIAGNOSIS — R42 Dizziness and giddiness: Secondary | ICD-10-CM

## 2020-05-06 DIAGNOSIS — R55 Syncope and collapse: Secondary | ICD-10-CM | POA: Diagnosis not present

## 2020-05-06 LAB — GLUCOSE, CAPILLARY
Glucose-Capillary: 146 mg/dL — ABNORMAL HIGH (ref 70–99)
Glucose-Capillary: 112 mg/dL — ABNORMAL HIGH (ref 70–99)

## 2020-05-06 MED ORDER — CHLORHEXIDINE GLUCONATE CLOTH 2 % EX PADS: 6.0000 | MEDICATED_PAD | Freq: Every day | CUTANEOUS | Status: DC

## 2020-05-06 MED ORDER — APIXABAN 5 MG PO TABS
5.0000 mg | ORAL_TABLET | Freq: Two times a day (BID) | ORAL | Status: DC
Start: 2020-05-06 — End: 2020-05-06
  Administered 2020-05-06: 5 mg via ORAL
  Filled 2020-05-06: qty 1

## 2020-05-06 NOTE — Progress Notes (Signed)
  Progress Note    05/06/2020 7:29 AM * No surgery date entered *  Subjective:  States he is feeling better. Sitting up on side of bed eating breakfast. Eager to go home   Vitals:   05/05/20 2024 05/06/20 0300  BP: (!) 158/67 (!) 149/58  Pulse: (!) 53 (!) 54  Resp: 18 18  Temp: 98.3 F (36.8 C) 98.6 F (37 C)  SpO2: 98% 95%   Physical Exam: Cardiac: regular Lungs: non labored Extremities: well perfused and warm. 2+ femoral pulses bilaterally. No palpable distal pulses. Motor and sensation intact Neurologic: alert and oriented  CBC    Component Value Date/Time   WBC 8.0 05/05/2020 0357   RBC 3.88 (L) 05/05/2020 0357   HGB 11.6 (L) 05/05/2020 0357   HGB 9.5 (L) 07/02/2017 0939   HCT 36.1 (L) 05/05/2020 0357   HCT 30.0 (L) 07/02/2017 0939   PLT 280 05/05/2020 0357   PLT 343 07/02/2017 0939   MCV 93.0 05/05/2020 0357   MCV 92 07/02/2017 0939   MCH 29.9 05/05/2020 0357   MCHC 32.1 05/05/2020 0357   RDW 17.2 (H) 05/05/2020 0357   RDW 15.2 07/02/2017 0939   LYMPHSABS 0.6 (L) 05/04/2020 0742   MONOABS 0.7 05/04/2020 0742   EOSABS 0.2 05/04/2020 0742   BASOSABS 0.1 05/04/2020 0742    BMET    Component Value Date/Time   NA 135 05/05/2020 0357   NA 138 12/05/2018 1010   K 3.8 05/05/2020 0357   CL 98 05/05/2020 0357   CO2 25 05/05/2020 0357   GLUCOSE 134 (H) 05/05/2020 0357   BUN 24 (H) 05/05/2020 0357   BUN 32 (H) 12/05/2018 1010   CREATININE 4.20 (H) 05/05/2020 0357   CALCIUM 9.0 05/05/2020 0357   GFRNONAA 15 (L) 05/05/2020 0357   GFRAA 14 (L) 01/17/2020 0641    INR    Component Value Date/Time   INR 1.1 01/14/2020 1305     Intake/Output Summary (Last 24 hours) at 05/06/2020 0729 Last data filed at 05/06/2020 0617 Gross per 24 hour  Intake 240 ml  Output 2287 ml  Net -2047 ml     Assessment/Plan:  70 y.o. male with lifestyle limiting claudication who had syncopal episode prior to planned Angiography. No further cardiac workup planned. To restart  AC as of yesterday. Plan to reschedule Angiogram with Dr. Trula Slade. Will defer to Dr. Trula Slade regarding timing but likely will have patient follow up as outpatient to reschedule  Karoline Caldwell, PA-C [facility] [phone] 05/06/2020 7:29 AM
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
## 2020-05-06 NOTE — Plan of Care (Signed)

## 2020-05-06 NOTE — Discharge Instructions (Addendum)
You were hospitalized for having a spell of passing out resulting in a fracture of your orbital bone. Thank you for allowing [place] to be part of your care.   Please call and follow up with: Your primary care provider, your cardiologist, and please follow up with your eye doctor. Please also call your vascular surgeon since your procedure was canceled.   Please note these changes made to your medications:   Please START taking: No medication changes  Please STOP taking: No medication changes  Please make sure to follow up with ophthalmology     Information on my medicine - ELIQUIS (apixaban)  Why was Eliquis prescribed for you? Eliquis was prescribed for you to reduce the risk of a blood clot forming that can cause a stroke if you have a medical condition called atrial fibrillation (a type of irregular heartbeat).  What do You need to know about Eliquis ? Take your Eliquis TWICE DAILY - one tablet in the morning and one tablet in the evening with or without food. If you have difficulty swallowing the tablet whole please discuss with your pharmacist how to take the medication safely.  Take Eliquis exactly as prescribed by your doctor and DO NOT stop taking Eliquis without talking to the doctor who prescribed the medication.  Stopping may increase your risk of developing a stroke.  Refill your prescription before you run out.  After discharge, you should have regular check-up appointments with your healthcare provider that is prescribing your Eliquis.  In the future your dose may need to be changed if your kidney function or weight changes by a significant amount or as you get older.  What do you do if you miss a dose? If you miss a dose, take it as soon as you remember on the same day and resume taking twice daily.  Do not take more than one dose of ELIQUIS at the same time to make up a missed dose.  Important Safety Information A possible side effect of Eliquis is bleeding. You  should call your healthcare provider right away if you experience any of the following: ? Bleeding from an injury or your nose that does not stop. ? Unusual colored urine (red or dark brown) or unusual colored stools (red or black). ? Unusual bruising for unknown reasons. ? A serious fall or if you hit your head (even if there is no bleeding).  Some medicines may interact with Eliquis and might increase your risk of bleeding or clotting while on Eliquis. To help avoid this, consult your healthcare provider or pharmacist prior to using any new prescription or non-prescription medications, including herbals, vitamins, non-steroidal anti-inflammatory drugs (NSAIDs) and supplements.  This website has more information on Eliquis (apixaban): http://www.eliquis.com/eliquis/home

## 2020-05-06 NOTE — Progress Notes (Signed)
D/C instructions given and reviewed. No questions asked but encouraged to call with any concerns. Tele and IV removed, tolerated well. 

## 2020-05-06 NOTE — Progress Notes (Signed)
Subjective:   James Chan was seen and evaluated at bedside. He states that he is doing well today, and has no complaints at this time. He states that he is ready to be discharged as his brother has to drive an hour and a half to get to the hospital.   Objective:  Vital signs in last 24 hours: Vitals:   05/05/20 1600 05/05/20 1630 05/05/20 2024 05/06/20 0300  BP: (!) 152/49 (!) 163/52 (!) 158/67 (!) 149/58  Pulse:  (!) 52 (!) 53 (!) 54  Resp: 13 (!) 25 18 18   Temp:  97.8 F (36.6 C) 98.3 F (36.8 C) 98.6 F (37 C)  TempSrc:  Oral Oral Oral  SpO2:  98% 98% 95%  Weight:  79.5 kg    Height:       Supplemental O2: Room Air SpO2: 95 %  Physical Exam:  Physical Exam Constitutional:      Appearance: Normal appearance.  Eyes:     Comments: Left Periorbital ecchymosis   Cardiovascular:     Rate and Rhythm: Regular rhythm. Bradycardia present.     Pulses: Normal pulses.     Heart sounds: Murmur heard.  No gallop.   Pulmonary:     Effort: No respiratory distress.     Breath sounds: Normal breath sounds. No wheezing or rales.  Abdominal:     Tenderness: There is no abdominal tenderness.  Skin:    General: Skin is warm and dry.  Neurological:     General: No focal deficit present.     Mental Status: He is alert and oriented to person, place, and time. Mental status is at baseline.  Psychiatric:        Mood and Affect: Mood normal.        Behavior: Behavior normal.     Filed Weights   05/05/20 0502 05/05/20 1310 05/05/20 1630  Weight: 81.7 kg 81.8 kg 79.5 kg     Intake/Output Summary (Last 24 hours) at 05/06/2020 0528 Last data filed at 05/05/2020 1620 Gross per 24 hour  Intake --  Output 2287 ml  Net -2287 ml   Net IO Since Admission: -2,924.17 mL [05/06/20 0528]  Pertinent Labs: CBC Latest Ref Rng & Units 05/05/2020 05/04/2020 02/02/2020  WBC 4.0 - 10.5 K/uL 8.0 8.5 10.9(H)  Hemoglobin 13.0 - 17.0 g/dL 11.6(L) 11.7(L) 8.2(L)  Hematocrit 39.0 - 52.0 % 36.1(L)  36.3(L) 25.5(L)  Platelets 150 - 400 K/uL 280 286 300    CMP Latest Ref Rng & Units 05/05/2020 05/04/2020 02/02/2020  Glucose 70 - 99 mg/dL 134(H) 154(H) 224(H)  BUN 8 - 23 mg/dL 24(H) 18 13  Creatinine 0.61 - 1.24 mg/dL 4.20(H) 3.69(H) 2.96(H)  Sodium 135 - 145 mmol/L 135 136 134(L)  Potassium 3.5 - 5.1 mmol/L 3.8 3.8 4.1  Chloride 98 - 111 mmol/L 98 99 94(L)  CO2 22 - 32 mmol/L 25 26 27   Calcium 8.9 - 10.3 mg/dL 9.0 9.3 8.2(L)  Total Protein 6.5 - 8.1 g/dL 6.4(L) - 6.6  Total Bilirubin 0.3 - 1.2 mg/dL 1.1 - 1.8(H)  Alkaline Phos 38 - 126 U/L 78 - 90  AST 15 - 41 U/L 13(L) - 32  ALT 0 - 44 U/L 14 - 9    Imaging: No results found.  Assessment/Plan:   Active Problems:   Syncope   Patient Summary: James Chan is a 70 y.o.  male with past medical history of ESRF on HD, CAD, ICM, Hx of VT arrest w/recent ICD  implant, Chronic CHF (combined), HTN, Dialysis related hypotension, DM, Paroxysmal Afib, who presented to ED after syncopal episode.  Syncopal episode Left Orbital Fracture Cardiology does not believe further intervention is warranted. Patient continues to be bradycardic, we will continue to hold his beta blocker. Will advise patient to follow up with cardiology and ophtho for his orbital fracture. - Will follow up with ophthalmologist  - Hold BB until outpatient F/U  Paroxysmal A. Fib:  -Continue amiodarone - Continue eliquis   ESRD on HD MWF - Will continue to follow in the outpatient setting for his MWF sessions.   CAD Hx of V. fib arrest status post ICD insertion Thus far, ischemic workup has been unremarkable. Electrolytes stable, trended troponin's without elevation. Hemoglobin stable.  -Cardiology following, no further intervention needed - Will hold metoprolol at DC given bradycardia.   PAD:  LE angiography canceled today due to syncopal episode -follow up outpatient to reschedule  Normocytic Anemia. Hgb of 11.6 MCV of 93. Chronic and suspect  secondary to CKD.  ESA Q 4 weeks  Diet: Carb-Modified IVF: None,None VTE: SCDs Code: Full PT/OT recs: OT recommend tub/shower seat  Dispo: Anticipated discharge to to home today.  Sanjuana Letters DO Internal Medicine Resident PGY-1 Pager [phone] Please contact the on call pager after 5 pm and on weekends at [phone].

## 2020-05-06 NOTE — TOC CAGE-AID Note (Signed)
Transition of Care [facility]) - CAGE-AID Screening   Patient Details  Name: James Chan MRN: [id number] Date of Birth: 02-25-1951  Transition of Care [facility]) CM/SW Contact:    Bethann Berkshire, [place] Phone Number: 05/06/2020, 11:38 AM   Clinical Narrative: CSW completed CAGEAID with pt.    CAGE-AID Screening:    Have You Ever Felt You Ought to Cut Down on Your Drinking or Drug Use?: No Have People Annoyed You By Critizing Your Drinking Or Drug Use?: No Have You Felt Bad Or Guilty About Your Drinking Or Drug Use?: No Have You Ever Had a Drink or Used Drugs First Thing In The Morning to Steady Your Nerves or to Get Rid of a Hangover?: No CAGE-AID Score: 0

## 2020-05-06 NOTE — Progress Notes (Addendum)
[facility] Progress Note   Subjective:  Patient seen and examined at bedside.  Feeling better today.  Dizziness improved.  Admits will get dizzy if stands up too quickly or turns his head quickly. Denies CP, SOB, n/v/d, abdominal pain, weakness and fatigue.  Tolerated dialysis well yesterday. Notes improvement in SOB today.  Unsure about orthopnea as he did not try to lay flat in bed.   Objective Vitals:   05/05/20 1600 05/05/20 1630 05/05/20 2024 05/06/20 0300  BP: (!) 152/49 (!) 163/52 (!) 158/67 (!) 149/58  Pulse:  (!) 52 (!) 53 (!) 54  Resp: 13 (!) 25 18 18   Temp:  97.8 F (36.6 C) 98.3 F (36.8 C) 98.6 F (37 C)  TempSrc:  Oral Oral Oral  SpO2:  98% 98% 95%  Weight:  79.5 kg    Height:       Physical Exam General:chronically ill appearing male in NAD Heart:+bradycardia, regular rhythm  Lungs:CTAB Abdomen:soft, NTND Extremities:no LE edema Dialysis Access: RU AVF +b   Filed Weights   05/05/20 0502 05/05/20 1310 05/05/20 1630  Weight: 81.7 kg 81.8 kg 79.5 kg    Intake/Output Summary (Last 24 hours) at 05/06/2020 1018 Last data filed at 05/06/2020 0751 Gross per 24 hour  Intake 480 ml  Output 2287 ml  Net -1807 ml    Additional Objective Labs: Basic Metabolic Panel: Recent Labs  Lab 05/04/20 0742 05/05/20 0357  NA 136 135  K 3.8 3.8  CL 99 98  CO2 26 25  GLUCOSE 154* 134*  BUN 18 24*  CREATININE 3.69* 4.20*  CALCIUM 9.3 9.0   Liver Function Tests: Recent Labs  Lab 05/05/20 0357  AST 13*  ALT 14  ALKPHOS 78  BILITOT 1.1  PROT 6.4*  ALBUMIN 3.4*   CBC: Recent Labs  Lab 05/04/20 0742 05/05/20 0357  WBC 8.5 8.0  NEUTROABS 7.0  --   HGB 11.7* 11.6*  HCT 36.3* 36.1*  MCV 93.6 93.0  PLT 286 280   CBG: Recent Labs  Lab 05/05/20 0607 05/05/20 1125 05/05/20 1757 05/05/20 2139 05/06/20 0619  GLUCAP 131* 123* 103* 156* 146*   Studies/Results: ECHOCARDIOGRAM COMPLETE  Result Date: 05/04/2020    ECHOCARDIOGRAM REPORT    Patient Name:   NICLAS MARKELL Date of Exam: 05/04/2020 Medical Rec #:  [id number]        Height:       72.0 in Accession #:    [id number]       Weight:       181.0 lb Date of Birth:  Oct 17, 1950       BSA:          2.042 m Patient Age:    70 years         BP:           159/58 mmHg Patient Gender: M                HR:           53 bpm. Exam Location:  Inpatient Procedure: 2D Echo Indications:    syncope  History:        Patient has prior history of Echocardiogram examinations, most                 recent 01/15/2020. CHF, Defibrillator, chronic kidney disease;                 Risk Factors:Dyslipidemia and Diabetes.  Sonographer:    Johny Chess Referring Phys:   Left ventricular ejection fraction, by estimation, is 25%. The left ventricle has severely decreased function. The left ventricle demonstrates global hypokinesis with inferolateral akinesis. The left ventricular internal cavity size was mildly dilated. There is mild left ventricular hypertrophy. Left ventricular diastolic parameters are consistent with Grade III diastolic dysfunction (restrictive).  2. Right ventricular systolic function is mildly reduced. The right ventricular size is normal. There is severely elevated pulmonary artery systolic pressure. The estimated right ventricular systolic pressure is 98.3 mmHg.  3. Left atrial size was moderately dilated.  4. Right atrial size was mildly dilated.  5. The mitral valve is normal in structure. Trivial mitral valve regurgitation. No evidence of mitral stenosis.  6. The aortic valve is tricuspid. Aortic valve regurgitation is trivial. Mild aortic valve sclerosis is present, with no evidence of aortic valve stenosis.  7. The inferior vena cava is normal in size with <50% respiratory variability, suggesting right atrial pressure of 8 mmHg. FINDINGS  Left Ventricle: Left ventricular ejection fraction, by estimation, is 25%. The left ventricle has severely decreased  function. The left ventricle demonstrates global hypokinesis. The left ventricular internal cavity size was mildly dilated. There is mild left ventricular hypertrophy. Left ventricular diastolic parameters are consistent with Grade III diastolic dysfunction (restrictive). Right Ventricle: The right ventricular size is normal. No increase in right ventricular wall thickness. Right ventricular systolic function is mildly reduced. There is severely elevated pulmonary artery systolic pressure. The tricuspid regurgitant velocity is 4.04 m/s, and with an assumed right atrial pressure of 8 mmHg, the estimated right ventricular systolic pressure is 38.2 mmHg. Left Atrium: Left atrial size was moderately dilated. Right Atrium: Right atrial size was mildly dilated. Pericardium: Trivial pericardial effusion is present. Mitral Valve: The mitral valve is normal in structure. Trivial mitral valve regurgitation. No evidence of mitral valve stenosis. Tricuspid Valve: The tricuspid valve is normal in structure. Tricuspid valve regurgitation is mild. Aortic Valve: The aortic valve is tricuspid. Aortic valve regurgitation is trivial. Mild aortic valve sclerosis is present, with no evidence of aortic valve stenosis. Pulmonic Valve: The pulmonic valve was normal in structure. Pulmonic valve regurgitation is trivial. Aorta: The aortic root is normal in size and structure. Venous: The inferior vena cava is normal in size with less than 50% respiratory variability, suggesting right atrial pressure of 8 mmHg. IAS/Shunts: No atrial level shunt detected by color flow Doppler.  LEFT VENTRICLE PLAX 2D LVIDd:         5.70 cm      Diastology LVIDs:         5.10 cm      LV e' medial:    2.82 cm/s LV PW:         1.10 cm      LV E/e' medial:  39.4 LV IVS:        1.30 cm      LV e' lateral:   5.57 cm/s                             LV E/e' lateral: 19.9  LV Volumes (MOD) LV vol d, MOD A4C: 213.0 ml LV vol s, MOD A4C: 156.0 ml LV SV MOD A4C:     213.0  ml RIGHT VENTRICLE            IVC RV S prime:     7.63 cm/s  IVC diam: 1.80 cm TAPSE (M-mode): 1.6 cm LEFT ATRIUM  Index       RIGHT ATRIUM           Index LA diam:        4.50 cm  2.20 cm/m  RA Area:     21.60 cm LA Vol (A2C):   102.0 ml 49.95 ml/m RA Volume:   73.90 ml  36.19 ml/m LA Vol (A4C):   93.5 ml  45.79 ml/m LA Biplane Vol: 101.0 ml 49.46 ml/m  AORTIC VALVE LVOT Vmax:   67.20 cm/s LVOT Vmean:  43.900 cm/s LVOT VTI:    0.168 m  AORTA Ao Asc diam: 3.60 cm MITRAL VALVE                TRICUSPID VALVE MV Area (PHT): 4.96 cm     TR Peak grad:   65.3 mmHg MV Decel Time: 153 msec     TR Vmax:        404.00 cm/s MV E velocity: 111.00 cm/s MV A velocity: 43.20 cm/s   SHUNTS MV E/A ratio:  2.57         Systemic VTI: 0.17 m Loralie Champagne MD Electronically signed by Loralie Champagne MD Signature Date/Time: 05/04/2020/6:49:17 PM    Final     Medications: . sodium chloride 10 mL/hr at 05/04/20 1043   . amiodarone  200 mg Oral Daily  . apixaban  5 mg Oral BID  . aspirin  81 mg Oral Daily  . atorvastatin  40 mg Oral QPM  . hydrALAZINE  25 mg Oral 3 times per day on Sun Tue Thu Sat  . hydrALAZINE  25 mg Oral 2 times per day on Mon Wed Fri  . insulin aspart  0-5 Units Subcutaneous QHS  . insulin aspart  0-9 Units Subcutaneous TID WC  . midodrine  5 mg Oral Q M,W,F  . sodium chloride flush  3 mL Intravenous Q12H    Dialysis Orders: MWF - MWF  3.75hrs, BFR 400, DFR 800,  EDW 80kg, 3K/ 2.25Ca, P2  Access: RU AVF  Heparin 2000 units Mircera 100 mcg q2wks - last 04/28/20 Calcitriol 1.76mcg PO qHD  Assessment/Plan: 1. Syncope - etiology unclear. per cardio review of last 2 ECHO's are similar with no significant difference in systolic function and wall motion. Per cardio/admit 2. Bradycardia - beta blocker held. To have PO follow up with cardiology.  3. L orbital fracture - f/u with ophthalmologist. 4.  ESRD -  On HD MWF. HD completed yesterday, tolerated well. K 3.8. Will write orders  for tomorrow in case remains admitted.  5.  Hypertension/volume  - BP elevated, continue home meds.  Getting midodrine with HD but may hold if BP elevated prior to dialysis. A little under EDW post HD yesterday, will lower 0.5kg.  May require additional lowering at outpatient dialysis. Also may benefit from continuing a diuretic on off days.  UF as tolerated.  6.  Anemia of CKD - Hgb 11.6. ESA recently dosed.  7.  Secondary Hyperparathyroidism -  Ca at goal. Continue VDRA and binders 8.  Nutrition - renal diet w/fluid restrictions.  9. DMT2 - per primary 10. paroxysmal A-fib - per cardiology 11. Chronic systolic/diastolic HF - stable per cardiology 12. Hx Vtach/Vfib, ICD in place - per cardiology 13. Dispo - ok for d/c from renal standpoint   Jen Mow, PA-C [place] 05/06/2020,10:18 AM  LOS: 0 days

## 2020-05-06 NOTE — Progress Notes (Signed)
Progress Note  Patient Name: James Chan Date of Encounter: 05/06/2020  [facility] Cardiologist: Shirlee More, MD   Subjective   Feels ok  Inpatient Medications    Scheduled Meds: . amiodarone  200 mg Oral Daily  . apixaban  5 mg Oral BID  . aspirin  81 mg Oral Daily  . atorvastatin  40 mg Oral QPM  . Chlorhexidine Gluconate Cloth  6 each Topical Q0600  . hydrALAZINE  25 mg Oral 3 times per day on Sun Tue Thu Sat  . hydrALAZINE  25 mg Oral 2 times per day on Mon Wed Fri  . insulin aspart  0-5 Units Subcutaneous QHS  . insulin aspart  0-9 Units Subcutaneous TID WC  . midodrine  5 mg Oral Q M,W,F  . sodium chloride flush  3 mL Intravenous Q12H   Continuous Infusions: . sodium chloride 10 mL/hr at 05/04/20 1043   PRN Meds: acetaminophen **OR** acetaminophen, albuterol, polyethylene glycol   Vital Signs    Vitals:   05/05/20 1630 05/05/20 2024 05/06/20 0300 05/06/20 1105  BP: (!) 163/52 (!) 158/67 (!) 149/58 (!) 171/65  Pulse: (!) 52 (!) 53 (!) 54 (!) 55  Resp: (!) 25 18 18 18   Temp: 97.8 F (36.6 C) 98.3 F (36.8 C) 98.6 F (37 C) 97.9 F (36.6 C)  TempSrc: Oral Oral Oral Oral  SpO2: 98% 98% 95% 99%  Weight: 79.5 kg     Height:        Intake/Output Summary (Last 24 hours) at 05/06/2020 1120 Last data filed at 05/06/2020 0900 Gross per 24 hour  Intake 720 ml  Output 2687 ml  Net -1967 ml   Last 3 Weights 05/05/2020 05/05/2020 05/05/2020  Weight (lbs) 175 lb 4.3 oz 180 lb 4.8 oz 180 lb 3.2 oz  Weight (kg) 79.5 kg 81.784 kg 81.738 kg      Telemetry    NSR- Personally Reviewed  ECG    sinus bradycardia - Personally Reviewed  Physical Exam  GEN: No acute distress.   Neck: No JVD Cardiac: RRR, no murmurs, rubs, or gallops.  Respiratory: Clear to auscultation bilaterally. GI: Soft, nontender, non-distended  MS: No edema; No deformity. Neuro:  Nonfocal  Psych: Normal affect  Cut over left eye  Labs    High Sensitivity Troponin:   Recent  Labs  Lab 05/04/20 0742 05/04/20 1047  TROPONINIHS 28* 25*      Chemistry Recent Labs  Lab 05/04/20 0742 05/05/20 0357  NA 136 135  K 3.8 3.8  CL 99 98  CO2 26 25  GLUCOSE 154* 134*  BUN 18 24*  CREATININE 3.69* 4.20*  CALCIUM 9.3 9.0  PROT  --  6.4*  ALBUMIN  --  3.4*  AST  --  13*  ALT  --  14  ALKPHOS  --  78  BILITOT  --  1.1  GFRNONAA 17* 15*  ANIONGAP 11 12     Hematology Recent Labs  Lab 05/04/20 0742 05/05/20 0357  WBC 8.5 8.0  RBC 3.88* 3.88*  HGB 11.7* 11.6*  HCT 36.3* 36.1*  MCV 93.6 93.0  MCH 30.2 29.9  MCHC 32.2 32.1  RDW 17.3* 17.2*  PLT 286 280    BNP Recent Labs  Lab 05/04/20 0913  BNP 2,032.0*     DDimer No results for input(s): DDIMER in the last 168 hours.   Radiology    ECHOCARDIOGRAM COMPLETE  Result Date: 05/04/2020    ECHOCARDIOGRAM REPORT   Patient  Name:   James Chan Date of Exam: 05/04/2020 Medical Rec #:  [id number]        Height:       72.0 in Accession #:    [id number]       Weight:       181.0 lb Date of Birth:  1950/11/26       BSA:          2.042 m Patient Age:    70 years         BP:           159/58 mmHg Patient Gender: M                HR:           53 bpm. Exam Location:  Inpatient Procedure: 2D Echo Indications:    syncope  History:        Patient has prior history of Echocardiogram examinations, most                 recent 01/15/2020. CHF, Defibrillator, chronic kidney disease;                 Risk Factors:Dyslipidemia and Diabetes.  Sonographer:    Johny Chess Referring Phys: [place]  1. Left ventricular ejection fraction, by estimation, is 25%. The left ventricle has severely decreased function. The left ventricle demonstrates global hypokinesis with inferolateral akinesis. The left ventricular internal cavity size was mildly dilated. There is mild left ventricular hypertrophy. Left ventricular diastolic parameters are consistent with Grade III diastolic dysfunction (restrictive).  2.  Right ventricular systolic function is mildly reduced. The right ventricular size is normal. There is severely elevated pulmonary artery systolic pressure. The estimated right ventricular systolic pressure is 48.5 mmHg.  3. Left atrial size was moderately dilated.  4. Right atrial size was mildly dilated.  5. The mitral valve is normal in structure. Trivial mitral valve regurgitation. No evidence of mitral stenosis.  6. The aortic valve is tricuspid. Aortic valve regurgitation is trivial. Mild aortic valve sclerosis is present, with no evidence of aortic valve stenosis.  7. The inferior vena cava is normal in size with <50% respiratory variability, suggesting right atrial pressure of 8 mmHg. FINDINGS  Left Ventricle: Left ventricular ejection fraction, by estimation, is 25%. The left ventricle has severely decreased function. The left ventricle demonstrates global hypokinesis. The left ventricular internal cavity size was mildly dilated. There is mild left ventricular hypertrophy. Left ventricular diastolic parameters are consistent with Grade III diastolic dysfunction (restrictive). Right Ventricle: The right ventricular size is normal. No increase in right ventricular wall thickness. Right ventricular systolic function is mildly reduced. There is severely elevated pulmonary artery systolic pressure. The tricuspid regurgitant velocity is 4.04 m/s, and with an assumed right atrial pressure of 8 mmHg, the estimated right ventricular systolic pressure is 46.2 mmHg. Left Atrium: Left atrial size was moderately dilated. Right Atrium: Right atrial size was mildly dilated. Pericardium: Trivial pericardial effusion is present. Mitral Valve: The mitral valve is normal in structure. Trivial mitral valve regurgitation. No evidence of mitral valve stenosis. Tricuspid Valve: The tricuspid valve is normal in structure. Tricuspid valve regurgitation is mild. Aortic Valve: The aortic valve is tricuspid. Aortic valve regurgitation  is trivial. Mild aortic valve sclerosis is present, with no evidence of aortic valve stenosis. Pulmonic Valve: The pulmonic valve was normal in structure. Pulmonic valve regurgitation is trivial. Aorta: The aortic root is normal in size and structure. Venous: The  inferior vena cava is normal in size with less than 50% respiratory variability, suggesting right atrial pressure of 8 mmHg. IAS/Shunts: No atrial level shunt detected by color flow Doppler.  LEFT VENTRICLE PLAX 2D LVIDd:         5.70 cm      Diastology LVIDs:         5.10 cm      LV e' medial:    2.82 cm/s LV PW:         1.10 cm      LV E/e' medial:  39.4 LV IVS:        1.30 cm      LV e' lateral:   5.57 cm/s                             LV E/e' lateral: 19.9  LV Volumes (MOD) LV vol d, MOD A4C: 213.0 ml LV vol s, MOD A4C: 156.0 ml LV SV MOD A4C:     213.0 ml RIGHT VENTRICLE            IVC RV S prime:     7.63 cm/s  IVC diam: 1.80 cm TAPSE (M-mode): 1.6 cm LEFT ATRIUM              Index       RIGHT ATRIUM           Index LA diam:        4.50 cm  2.20 cm/m  RA Area:     21.60 cm LA Vol (A2C):   102.0 ml 49.95 ml/m RA Volume:   73.90 ml  36.19 ml/m LA Vol (A4C):   93.5 ml  45.79 ml/m LA Biplane Vol: 101.0 ml 49.46 ml/m  AORTIC VALVE LVOT Vmax:   67.20 cm/s LVOT Vmean:  43.900 cm/s LVOT VTI:    0.168 m  AORTA Ao Asc diam: 3.60 cm MITRAL VALVE                TRICUSPID VALVE MV Area (PHT): 4.96 cm     TR Peak grad:   65.3 mmHg MV Decel Time: 153 msec     TR Vmax:        404.00 cm/s MV E velocity: 111.00 cm/s MV A velocity: 43.20 cm/s   SHUNTS MV E/A ratio:  2.57         Systemic VTI: 0.17 m Loralie Champagne MD Electronically signed by Loralie Champagne MD Signature Date/Time: 05/04/2020/6:49:17 PM    Final     Cardiac Studies   Echo unchanged  Patient Profile     70 y.o. male with syncope  Assessment & Plan    Syncope:  Agree with holding metoprolol.  On amiodarone. Has had orthostatic sx.  Midodrine also ordered.  OK to discharge from a cardiac  standpoint.  [facility] will sign off.   Medication Recommendations:  As above Other recommendations (labs, testing, etc):  Ok to resume eliquis for stroke prevention Follow up as an outpatient:  As scheduled  For questions or updates, please contact [place] Please consult [email or web address] for contact info under        Signed, Larae Grooms, MD  05/06/2020, 11:20 AM

## 2020-05-07 ENCOUNTER — Telehealth: Payer: Self-pay | Admitting: Physician Assistant

## 2020-05-07 NOTE — Telephone Encounter (Signed)
Transition of care contact from inpatient facility  Date of Discharge: 05/05/20 Date of Contact: 05/07/20 Method of contact: Phone  Attempted to contact patient to discuss transition of care from inpatient admission. Patient did not answer the phone. Message was left on the patient's voicemail with call back instructions.  Anice Paganini, PA-C 05/07/2020, 12:19 PM  [employer]

## 2020-05-09 NOTE — Discharge Summary (Addendum)
Name: James Chan MRN: [id number] DOB: 02/19/1951 70 y.o. PCP: Cher Nakai, MD  Date of Admission: 05/04/2020  7:45 AM Date of Discharge: 05/06/2020 Attending Physician: Dr. Angelia Mould  Discharge Diagnosis: Active Problems:   Syncope    Discharge Medications: Allergies as of 05/06/2020   No Known Allergies     Medication List    STOP taking these medications   metoprolol tartrate 25 MG tablet Commonly known as: LOPRESSOR     TAKE these medications   Accu-Chek Aviva Plus test strip Generic drug: glucose blood daily.   Accu-Chek Softclix Lancets lancets 1 each by Other route as needed for other.   acetaminophen 325 MG tablet Commonly known as: TYLENOL Take 1-2 tablets (325-650 mg total) by mouth every 4 (four) hours as needed for mild pain.   allopurinol 100 MG tablet Commonly known as: ZYLOPRIM Take 100 mg by mouth daily.   amiodarone 200 MG tablet Commonly known as: PACERONE Take 1 tablet (200 mg total) by mouth daily.   apixaban 5 MG Tabs tablet Commonly known as: ELIQUIS Take 1 tablet (5 mg total) by mouth 2 (two) times daily.   aspirin 81 MG chewable tablet Chew 1 tablet (81 mg total) by mouth daily. Stop on 01/20/20   atorvastatin 40 MG tablet Commonly known as: LIPITOR Take 1 tablet (40 mg total) by mouth every evening.   calcitRIOL 0.25 MCG capsule Commonly known as: ROCALTROL Take 7 capsules (1.75 mcg total) by mouth every Monday, Wednesday, and Friday with hemodialysis.   hydrALAZINE 25 MG tablet Commonly known as: APRESOLINE Take 25 mg by mouth See admin instructions. Pt takes twice a day on days of dialysis (MWF) and three times a day all other days.   insulin detemir 100 UNIT/ML injection Commonly known as: LEVEMIR Inject 30 Units into the skin as needed (High blood Glucose).   latanoprost 0.005 % ophthalmic solution Commonly known as: XALATAN Place 1 drop into both eyes at bedtime.   lidocaine-prilocaine cream Commonly known as:  EMLA Apply 1 application topically See admin instructions. Apply small amount to access site 1 to 2 hours before dialysis then cover with saran wrap.   midodrine 5 MG tablet Commonly known as: PROAMATINE Take 5 mg by mouth every Monday, Wednesday, and Friday. just before beginning dialysis   MIRCERA IJ Mircera   nitroGLYCERIN 0.4 MG SL tablet Commonly known as: NITROSTAT Place 1 tablet (0.4 mg total) under the tongue every 5 (five) minutes as needed for chest pain.   NovoTwist 32G X 5 MM Misc Generic drug: Insulin Pen Needle   oxyCODONE-acetaminophen 5-325 MG tablet Commonly known as: PERCOCET/ROXICET Take 1 tablet by mouth every 4 (four) hours as needed for severe pain.   ProAir HFA 108 (90 Base) MCG/ACT inhaler Generic drug: albuterol Inhale 2 puffs into the lungs every 6 (six) hours as needed for wheezing or shortness of breath.   promethazine 25 MG tablet Commonly known as: PHENERGAN Take 25 mg by mouth every 6 (six) hours as needed for nausea or vomiting.   torsemide 100 MG tablet Commonly known as: DEMADEX Take 0.5 tablets (50 mg total) by mouth daily.       Disposition and follow-up:   James Chan was discharged from [facility] in Good condition.  At the hospital follow up visit please address:  1.  Follow-up:  A. Syncopal Episode    B. ESRD   C. Acute Fracture Inferior Left Orbital Wall  2.  Labs /  imaging needed at time of follow-up: None  3.  Pending labs/ test needing follow-up: None  4.  Medication Changes  Started: None  Stopped: Lopressor  Abx - None   End Date:  Follow-up Appointments:  Follow-up Information    Cher Nakai, MD. Go on 05/12/2020.   Specialty: Internal Medicine Why: $RemoveBefor'@2'BJoYkcROWfxj$ :15pm Contact information: [place] [address] [phone]        Richardo Priest, MD.   Specialties: Cardiology, Radiology Contact information: [place] [address] [facility] Follow up.   Why: Shower chair- to be delivered to the room prior to transition home.  Contact information: [place] [id number] [place] by problem list:    Syncopal Episode Patient was at [facility] for vascular angiography of his leg when he felt short of breath, dizzy and then he was uncertain as to what ensued. He was unconscious for a few minutes before coming to. No seizure activity was noted. Sustained left upper eye lid laceration, found to have left orbital wall fx on MRI. He denied chest pain or palpitations prior. He was found to be bradycardic, but was otherwise hemodynamically stable. His ICD was interrogated without evidence of defibrillator firing. ICD unable to deduce rhythm had at the time of the syncopal episode. Cardiology was consulted, echocardiogram upon our review as well as cardiology's review without change in EF. Cardiology did not believe patient needed catheterization during admission and recommended outpatient follow up. Unclear etiology of his syncopal episode. Patient without any abnormalities on tele and was discharged home. His home metoprolol was held at discharge in setting of his bradycardia.   ESRD Patient's dialysis was resumed during admission and he was followed by nephrology. No complications occurred.  PAD Patient's vascular imaging was rescheduled due to his syncopal episode prior to admission.   Discharge Vitals:   BP (!) 171/65 (BP Location: Left Arm)   Pulse (!) 55   Temp 97.9 F (36.6 C) (Oral)   Resp 18   Ht 6' (1.829 m)   Wt 79.5 kg   SpO2 99%   BMI 23.77 kg/m   Pertinent Labs, Studies, and Procedures:  CBC Latest Ref Rng & Units 05/05/2020 05/04/2020 02/02/2020  WBC 4.0 - 10.5 K/uL 8.0 8.5 10.9(H)  Hemoglobin 13.0 - 17.0 g/dL 11.6(L) 11.7(L) 8.2(L)  Hematocrit 39.0 - 52.0 % 36.1(L) 36.3(L) 25.5(L)  Platelets 150 - 400 K/uL 280 286 300    CMP  Latest Ref Rng & Units 05/05/2020 05/04/2020 02/02/2020  Glucose 70 - 99 mg/dL 134(H) 154(H) 224(H)  BUN 8 - 23 mg/dL 24(H) 18 13  Creatinine 0.61 - 1.24 mg/dL 4.20(H) 3.69(H) 2.96(H)  Sodium 135 - 145 mmol/L 135 136 134(L)  Potassium 3.5 - 5.1 mmol/L 3.8 3.8 4.1  Chloride 98 - 111 mmol/L 98 99 94(L)  CO2 22 - 32 mmol/L $RemoveB'25 26 27  'kQSgwDyn$ Calcium 8.9 - 10.3 mg/dL 9.0 9.3 8.2(L)  Total Protein 6.5 - 8.1 g/dL 6.4(L) - 6.6  Total Bilirubin 0.3 - 1.2 mg/dL 1.1 - 1.8(H)  Alkaline Phos 38 - 126 U/L 78 - 90  AST 15 - 41 U/L 13(L) - 32  ALT 0 - 44 U/L 14 - 9    DG Chest 2 View  Result Date: 05/04/2020 CLINICAL DATA:  70 year old male with shortness of  breath EXAM: CHEST - 2 VIEW COMPARISON:  02/02/2020 FINDINGS: Cardiomediastinal silhouette unchanged in size and contour. External AICD is unchanged. Evidence of coronary artery disease/prior PTC I No pneumothorax. Meniscus on the lateral view at the posterior lung base. Mild interlobular septal thickening. IMPRESSION: Acute CHF with small bilateral pleural effusions. Unchanged external AICD Electronically Signed   By: Corrie Mckusick D.O.   On: 05/04/2020 08:36   CT Head Wo Contrast  Result Date: 05/04/2020 CLINICAL DATA:  Head injury, syncope. EXAM: CT HEAD WITHOUT CONTRAST TECHNIQUE: Contiguous axial images were obtained from the base of the skull through the vertex without intravenous contrast. COMPARISON:  January 14, 2020. FINDINGS: Brain: Mild chronic ischemic white matter disease is noted. Mild diffuse cortical atrophy is noted. Old right cerebellar infarction is noted. Old left frontal infarction is noted. No mass effect or midline shift is noted. Ventricular size is within normal limits. There is no evidence of mass lesion, hemorrhage or acute infarction. Vascular: No hyperdense vessel or unexpected calcification. Skull: Normal. Negative for fracture or focal lesion. Sinuses/Orbits: Small amount of fluid is seen posteriorly in the left maxillary sinus and  there is the suggestion of a probable blowout fracture involving the posterior portion of the left orbital floor. Left ethmoid sinusitis is noted. Other: Soft tissue gas is seen overlying the left infraorbital rim suggesting traumatic injury. IMPRESSION: 1. Mild chronic ischemic white matter disease. Mild diffuse cortical atrophy. 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 No acute intracranial abnormality seen. 2. Soft tissue gas is seen overlying the left infraorbital rim suggesting traumatic injury. Probable blowout fracture is seen involving the posterior portion of the left orbital floor. CT scan of the maxillofacial region is recommended for further evaluation. These results will be called to the ordering clinician or representative by the Radiologist Assistant, and communication documented in the PACS or zVision Dashboard. Electronically Signed   By: Marijo Conception M.D.   On: 05/04/2020 08:22   ECHOCARDIOGRAM COMPLETE  Result Date: 05/04/2020    ECHOCARDIOGRAM REPORT   Patient Name:   RILAN EILAND Date of Exam: 05/04/2020 Medical Rec #:  [id number]        Height:       72.0 in Accession #:    [id number]       Weight:       181.0 lb Date of Birth:  09-Mar-1951       BSA:          2.042 m Patient Age:    40 years         BP:           159/58 mmHg Patient Gender: M                HR:           53 bpm. Exam Location:  Inpatient Procedure: 2D Echo Indications:    syncope  History:        Patient has prior history of Echocardiogram examinations, most                 recent 01/15/2020. CHF, Defibrillator, chronic kidney disease;                 Risk Factors:Dyslipidemia and Diabetes.  Sonographer:    Johny Chess Referring Phys: [place]  1. Left ventricular ejection fraction, by estimation, is 25%. The left ventricle has severely decreased function. The left ventricle demonstrates global hypokinesis with inferolateral akinesis. The left ventricular internal  cavity size was  mildly dilated. There is mild left ventricular hypertrophy. Left ventricular diastolic parameters are consistent with Grade III diastolic dysfunction (restrictive).  2. Right ventricular systolic function is mildly reduced. The right ventricular size is normal. There is severely elevated pulmonary artery systolic pressure. The estimated right ventricular systolic pressure is 16.1 mmHg.  3. Left atrial size was moderately dilated.  4. Right atrial size was mildly dilated.  5. The mitral valve is normal in structure. Trivial mitral valve regurgitation. No evidence of mitral stenosis.  6. The aortic valve is tricuspid. Aortic valve regurgitation is trivial. Mild aortic valve sclerosis is present, with no evidence of aortic valve stenosis.  7. The inferior vena cava is normal in size with <50% respiratory variability, suggesting right atrial pressure of 8 mmHg. FINDINGS  Left Ventricle: Left ventricular ejection fraction, by estimation, is 25%. The left ventricle has severely decreased function. The left ventricle demonstrates global hypokinesis. The left ventricular internal cavity size was mildly dilated. There is mild left ventricular hypertrophy. Left ventricular diastolic parameters are consistent with Grade III diastolic dysfunction (restrictive). Right Ventricle: The right ventricular size is normal. No increase in right ventricular wall thickness. Right ventricular systolic function is mildly reduced. There is severely elevated pulmonary artery systolic pressure. The tricuspid regurgitant velocity is 4.04 m/s, and with an assumed right atrial pressure of 8 mmHg, the estimated right ventricular systolic pressure is 09.6 mmHg. Left Atrium: Left atrial size was moderately dilated. Right Atrium: Right atrial size was mildly dilated. Pericardium: Trivial pericardial effusion is present. Mitral Valve: The mitral valve is normal in structure. Trivial mitral valve regurgitation. No evidence of mitral valve stenosis.  Tricuspid Valve: The tricuspid valve is normal in structure. Tricuspid valve regurgitation is mild. Aortic Valve: The aortic valve is tricuspid. Aortic valve regurgitation is trivial. Mild aortic valve sclerosis is present, with no evidence of aortic valve stenosis. Pulmonic Valve: The pulmonic valve was normal in structure. Pulmonic valve regurgitation is trivial. Aorta: The aortic root is normal in size and structure. Venous: The inferior vena cava is normal in size with less than 50% respiratory variability, suggesting right atrial pressure of 8 mmHg. IAS/Shunts: No atrial level shunt detected by color flow Doppler.  LEFT VENTRICLE PLAX 2D LVIDd:         5.70 cm      Diastology LVIDs:         5.10 cm      LV e' medial:    2.82 cm/s LV PW:         1.10 cm      LV E/e' medial:  39.4 LV IVS:        1.30 cm      LV e' lateral:   5.57 cm/s                             LV E/e' lateral: 19.9  LV Volumes (MOD) LV vol d, MOD A4C: 213.0 ml LV vol s, MOD A4C: 156.0 ml LV SV MOD A4C:     213.0 ml RIGHT VENTRICLE            IVC RV S prime:     7.63 cm/s  IVC diam: 1.80 cm TAPSE (M-mode): 1.6 cm LEFT ATRIUM              Index       RIGHT ATRIUM           Index LA  diam:        4.50 cm  2.20 cm/m  RA Area:     21.60 cm LA Vol (A2C):   102.0 ml 49.95 ml/m RA Volume:   73.90 ml  36.19 ml/m LA Vol (A4C):   93.5 ml  45.79 ml/m LA Biplane Vol: 101.0 ml 49.46 ml/m  AORTIC VALVE LVOT Vmax:   67.20 cm/s LVOT Vmean:  43.900 cm/s LVOT VTI:    0.168 m  AORTA Ao Asc diam: 3.60 cm MITRAL VALVE                TRICUSPID VALVE MV Area (PHT): 4.96 cm     TR Peak grad:   65.3 mmHg MV Decel Time: 153 msec     TR Vmax:        404.00 cm/s MV E velocity: 111.00 cm/s MV A velocity: 43.20 cm/s   SHUNTS MV E/A ratio:  2.57         Systemic VTI: 0.17 m Loralie Champagne MD Electronically signed by Loralie Champagne MD Signature Date/Time: 05/04/2020/6:49:17 PM    Final    CT Maxillofacial WO CM  Result Date: 05/04/2020 CLINICAL DATA:  Fall. Facial  trauma. Laceration above the left orbit. EXAM: CT MAXILLOFACIAL WITHOUT CONTRAST TECHNIQUE: Multidetector CT imaging of the maxillofacial structures was performed. Multiplanar CT image reconstructions were also generated. COMPARISON:  None. FINDINGS: Osseous: Acute fracture identified inferior left orbital wall. Trace hemorrhage noted left maxillary sinus. Slight deformity of the nasal bones evident, likely related to remote trauma without substantial displacement. No evidence for zygomatic arch fracture. Anterior and inferior walls of the left maxillary sinus are intact. No evidence for mandible fracture. Mild subluxation of the bilateral temporomandibular joints likely positional. Orbits: Trace gas identified inferior left orbit, compatible with above described fracture. Sinuses: Subtle chronic mucosal thickening noted paranasal sinuses with air-fluid level left maxillary sinus suggesting hemorrhage. Soft tissues: Gas in the soft tissues overlying the left orbital region would be compatible with reported clinical history of laceration. Limited intracranial: Incomplete visualization old right occipital lobe infarct. IMPRESSION: 1. Acute fracture inferior left orbital wall with trace hemorrhage in the left maxillary sinus. 2. Gas in the soft tissues overlying the left orbital region would be compatible with reported clinical history of laceration. Electronically Signed   By: Misty Stanley M.D.   On: 05/04/2020 09:48     Discharge Instructions: Discharge Instructions    Call MD for:  difficulty breathing, headache or visual disturbances   Complete by: As directed    Call MD for:  extreme fatigue   Complete by: As directed    Call MD for:  persistant dizziness or light-headedness   Complete by: As directed    Call MD for:  persistant nausea and vomiting   Complete by: As directed    Call MD for:  severe uncontrolled pain   Complete by: As directed    Call MD for:  temperature >100.4   Complete by: As  directed    Diet - low sodium heart healthy   Complete by: As directed    Increase activity slowly   Complete by: As directed    Increase activity slowly   Complete by: As directed       Signed: Riesa Pope, MD 05/09/2020, 2:23 PM   Pager: [phone]

## 2020-05-11 SURGERY — ABDOMINAL AORTOGRAM W/LOWER EXTREMITY
Anesthesia: LOCAL

## 2020-05-24 ENCOUNTER — Other Ambulatory Visit: Payer: Self-pay

## 2020-05-24 MED ORDER — SODIUM CHLORIDE 0.9% FLUSH: 3.0000 mL | INTRAVENOUS | Status: DC | PRN

## 2020-05-24 MED ORDER — SODIUM CHLORIDE 0.9% FLUSH: 3.0000 mL | Freq: Two times a day (BID) | INTRAVENOUS | Status: DC

## 2020-05-24 MED ORDER — SODIUM CHLORIDE 0.9 % IV SOLN: 250.0000 mL | INTRAVENOUS | Status: DC | PRN

## 2020-05-31 ENCOUNTER — Ambulatory Visit (INDEPENDENT_AMBULATORY_CARE_PROVIDER_SITE_OTHER): Payer: Medicare Other

## 2020-05-31 DIAGNOSIS — I469 Cardiac arrest, cause unspecified: Secondary | ICD-10-CM | POA: Diagnosis not present

## 2020-06-01 ENCOUNTER — Ambulatory Visit (HOSPITAL_COMMUNITY)
Admission: RE | Admit: 2020-06-01 | Discharge: 2020-06-01 | Disposition: A | Discharge status: Home / Self Care | Payer: Medicare Other | Attending: Surgery | Admitting: Surgery

## 2020-06-01 DIAGNOSIS — Z01818 Encounter for other preprocedural examination: Secondary | ICD-10-CM | POA: Insufficient documentation

## 2020-06-01 DIAGNOSIS — U071 COVID-19: Secondary | ICD-10-CM | POA: Diagnosis not present

## 2020-06-01 LAB — GLUCOSE, CAPILLARY: Glucose-Capillary: 124 mg/dL — ABNORMAL HIGH (ref 70–99)

## 2020-06-01 LAB — SARS CORONAVIRUS 2 BY RT PCR (HOSPITAL ORDER, PERFORMED IN ~~LOC~~ HOSPITAL LAB): SARS Coronavirus 2: POSITIVE — AB

## 2020-06-01 SURGERY — ABDOMINAL AORTOGRAM W/LOWER EXTREMITY
Anesthesia: LOCAL

## 2020-06-01 NOTE — Progress Notes (Signed)
Notified Dr Trula Slade that patient covid test from today is +.  Patient states he is a little fatigue and little nasal congestion, he states that is not new for him.  He has been vaccinated X 2.  He was instructed to quarantine for 10 days.  The office will call to reschedule the procedure

## 2020-06-01 NOTE — Progress Notes (Signed)
Microbiology called and stated Mr. Nurse was positive for covid. Kenleigh Toback notified.

## 2020-06-02 ENCOUNTER — Telehealth: Payer: Self-pay | Admitting: Family

## 2020-06-02 LAB — CUP PACEART REMOTE DEVICE CHECK
Battery Remaining Percentage: 96 %
Date Time Interrogation Session: 20220206053400
Implantable Lead Implant Date: 20210923
Implantable Lead Location: 753862
Implantable Lead Model: 3501
Implantable Lead Serial Number: 194255
Implantable Pulse Generator Implant Date: 20210923
Pulse Gen Serial Number: 140488

## 2020-06-02 NOTE — Telephone Encounter (Signed)
Called to discuss with patient about COVID-19 symptoms and the use of one of the available treatments for those with mild to moderate Covid symptoms and at a high risk of hospitalization.  Pt appears to qualify for outpatient treatment due to co-morbid conditions and/or a member of an at-risk group in accordance with the FDA Emergency Use Authorization.    Symptom onset: Unknown Vaccinated: Yes Booster? Unknown Qualifiers: age, BMI, cardiovascular disease  Unable to reach pt - Left VM.    James Chan

## 2020-06-03 ENCOUNTER — Telehealth: Payer: Self-pay

## 2020-06-03 NOTE — Telephone Encounter (Signed)
Called to discuss with patient about COVID-19 symptoms and the use of one of the available treatments for those with mild to moderate Covid symptoms and at a high risk of hospitalization.  Pt appears to qualify for outpatient treatment due to co-morbid conditions and/or a member of an at-risk group in accordance with the FDA Emergency Use Authorization.    Symptom onset: Unknown Vaccinated: Yes Booster? Unknown Immunocompromised? No  Qualifiers: Age.BMI,Cardiovascular disease  Unable to reach pt - Lest message with call back number [phone].  Marcello Moores

## 2020-06-07 NOTE — Progress Notes (Signed)
Remote ICD transmission.   

## 2020-06-10 ENCOUNTER — Ambulatory Visit: Payer: Medicare Other | Admitting: Podiatry

## 2020-06-15 ENCOUNTER — Other Ambulatory Visit: Payer: Self-pay

## 2020-06-15 ENCOUNTER — Ambulatory Visit (HOSPITAL_COMMUNITY)
Admission: RE | Admit: 2020-06-15 | Discharge: 2020-06-15 | Disposition: A | Discharge status: Home / Self Care | Payer: Medicare Other | Attending: Surgery | Admitting: Surgery

## 2020-06-15 ENCOUNTER — Encounter (HOSPITAL_COMMUNITY)
Admission: RE | Disposition: A | Discharge status: Home / Self Care | Payer: Self-pay | Source: Home / Self Care | Attending: Surgery

## 2020-06-15 DIAGNOSIS — E1151 Type 2 diabetes mellitus with diabetic peripheral angiopathy without gangrene: Secondary | ICD-10-CM | POA: Diagnosis present

## 2020-06-15 DIAGNOSIS — Z87891 Personal history of nicotine dependence: Secondary | ICD-10-CM | POA: Insufficient documentation

## 2020-06-15 DIAGNOSIS — Z79899 Other long term (current) drug therapy: Secondary | ICD-10-CM | POA: Diagnosis not present

## 2020-06-15 DIAGNOSIS — Z7982 Long term (current) use of aspirin: Secondary | ICD-10-CM | POA: Diagnosis not present

## 2020-06-15 DIAGNOSIS — I70213 Atherosclerosis of native arteries of extremities with intermittent claudication, bilateral legs: Secondary | ICD-10-CM | POA: Insufficient documentation

## 2020-06-15 DIAGNOSIS — Z7901 Long term (current) use of anticoagulants: Secondary | ICD-10-CM | POA: Insufficient documentation

## 2020-06-15 DIAGNOSIS — Z794 Long term (current) use of insulin: Secondary | ICD-10-CM | POA: Insufficient documentation

## 2020-06-15 HISTORY — PX: PERIPHERAL VASCULAR INTERVENTION: CATH118257

## 2020-06-15 HISTORY — PX: PERIPHERAL VASCULAR BALLOON ANGIOPLASTY: CATH118281

## 2020-06-15 HISTORY — PX: ABDOMINAL AORTOGRAM W/LOWER EXTREMITY: CATH118223

## 2020-06-15 LAB — POCT I-STAT, CHEM 8
BUN: 18 mg/dL (ref 8–23)
Calcium, Ion: 1.11 mmol/L — ABNORMAL LOW (ref 1.15–1.40)
Chloride: 98 mmol/L (ref 98–111)
Creatinine, Ser: 3 mg/dL — ABNORMAL HIGH (ref 0.61–1.24)
Glucose, Bld: 104 mg/dL — ABNORMAL HIGH (ref 70–99)
HCT: 37 % — ABNORMAL LOW (ref 39.0–52.0)
Hemoglobin: 12.6 g/dL — ABNORMAL LOW (ref 13.0–17.0)
Potassium: 4.1 mmol/L (ref 3.5–5.1)
Sodium: 136 mmol/L (ref 135–145)
TCO2: 26 mmol/L (ref 22–32)

## 2020-06-15 LAB — POCT ACTIVATED CLOTTING TIME
Activated Clotting Time: 214 seconds
Activated Clotting Time: 255 seconds
Activated Clotting Time: 178 seconds

## 2020-06-15 LAB — GLUCOSE, CAPILLARY
Glucose-Capillary: 99 mg/dL (ref 70–99)
Glucose-Capillary: 87 mg/dL (ref 70–99)
Glucose-Capillary: 83 mg/dL (ref 70–99)

## 2020-06-15 SURGERY — ABDOMINAL AORTOGRAM W/LOWER EXTREMITY
Anesthesia: LOCAL | Laterality: Right

## 2020-06-15 MED ORDER — HEPARIN SODIUM (PORCINE) 1000 UNIT/ML IJ SOLN
INTRAMUSCULAR | Status: DC | PRN
  Administered 2020-06-15: 9000 [IU] via INTRAVENOUS

## 2020-06-15 MED ORDER — SODIUM CHLORIDE 0.9 % IV SOLN: 250.0000 mL | INTRAVENOUS | Status: DC | PRN

## 2020-06-15 MED ORDER — IODIXANOL 320 MG/ML IV SOLN
INTRAVENOUS | Status: DC | PRN
  Administered 2020-06-15: 155 mL via INTRA_ARTERIAL

## 2020-06-15 MED ORDER — MORPHINE SULFATE (PF) 2 MG/ML IV SOLN: 2.0000 mg | INTRAVENOUS | Status: DC | PRN

## 2020-06-15 MED ORDER — HEPARIN (PORCINE) IN NACL 1000-0.9 UT/500ML-% IV SOLN
INTRAVENOUS | Status: DC | PRN
  Administered 2020-06-15 (×2): 500 mL

## 2020-06-15 MED ORDER — SODIUM CHLORIDE 0.9% FLUSH: 3.0000 mL | INTRAVENOUS | Status: DC | PRN

## 2020-06-15 MED ORDER — ONDANSETRON HCL 4 MG/2ML IJ SOLN: 4.0000 mg | Freq: Four times a day (QID) | INTRAMUSCULAR | Status: DC | PRN

## 2020-06-15 MED ORDER — CLOPIDOGREL BISULFATE 75 MG PO TABS: 75.0000 mg | ORAL_TABLET | Freq: Every day | ORAL | 11 refills | Status: DC

## 2020-06-15 MED ORDER — FENTANYL CITRATE (PF) 100 MCG/2ML IJ SOLN
INTRAMUSCULAR | Status: DC | PRN
  Administered 2020-06-15: 50 ug via INTRAVENOUS
  Administered 2020-06-15: 25 ug via INTRAVENOUS

## 2020-06-15 MED ORDER — FENTANYL CITRATE (PF) 100 MCG/2ML IJ SOLN
INTRAMUSCULAR | Status: AC
  Filled 2020-06-15: qty 2

## 2020-06-15 MED ORDER — HEPARIN SODIUM (PORCINE) 1000 UNIT/ML IJ SOLN
INTRAMUSCULAR | Status: AC
  Filled 2020-06-15: qty 1

## 2020-06-15 MED ORDER — LABETALOL HCL 5 MG/ML IV SOLN
INTRAVENOUS | Status: AC
  Filled 2020-06-15: qty 4

## 2020-06-15 MED ORDER — CLOPIDOGREL BISULFATE 75 MG PO TABS
ORAL_TABLET | ORAL | Status: AC
  Filled 2020-06-15: qty 1

## 2020-06-15 MED ORDER — MIDAZOLAM HCL 5 MG/5ML IJ SOLN
INTRAMUSCULAR | Status: AC
  Filled 2020-06-15: qty 5

## 2020-06-15 MED ORDER — LIDOCAINE HCL (PF) 1 % IJ SOLN
INTRAMUSCULAR | Status: DC | PRN
  Administered 2020-06-15 (×2): 15 mL via INTRADERMAL

## 2020-06-15 MED ORDER — MIDAZOLAM HCL 2 MG/2ML IJ SOLN
INTRAMUSCULAR | Status: DC | PRN
  Administered 2020-06-15 (×2): 1 mg via INTRAVENOUS

## 2020-06-15 MED ORDER — ACETAMINOPHEN 325 MG PO TABS: 650.0000 mg | ORAL_TABLET | ORAL | Status: DC | PRN

## 2020-06-15 MED ORDER — HEPARIN (PORCINE) IN NACL 1000-0.9 UT/500ML-% IV SOLN
INTRAVENOUS | Status: AC
  Filled 2020-06-15: qty 1000

## 2020-06-15 MED ORDER — HYDRALAZINE HCL 20 MG/ML IJ SOLN: 5.0000 mg | INTRAMUSCULAR | Status: DC | PRN

## 2020-06-15 MED ORDER — LIDOCAINE HCL (PF) 1 % IJ SOLN
INTRAMUSCULAR | Status: AC
  Filled 2020-06-15: qty 30

## 2020-06-15 MED ORDER — CLOPIDOGREL BISULFATE 75 MG PO TABS
ORAL_TABLET | ORAL | Status: DC | PRN
Start: 2020-06-15 — End: 2020-06-15
  Administered 2020-06-15: 75 mg via ORAL

## 2020-06-15 MED ORDER — SODIUM CHLORIDE 0.9% FLUSH: 3.0000 mL | Freq: Two times a day (BID) | INTRAVENOUS | Status: DC

## 2020-06-15 MED ORDER — OXYCODONE HCL 5 MG PO TABS: 5.0000 mg | ORAL_TABLET | ORAL | Status: DC | PRN

## 2020-06-15 MED ORDER — LABETALOL HCL 5 MG/ML IV SOLN
10.0000 mg | INTRAVENOUS | Status: DC | PRN
  Administered 2020-06-15: 10 mg via INTRAVENOUS

## 2020-06-15 SURGICAL SUPPLY — 25 items
BALLN MUSTANG 8.0X40 75 (BALLOONS)
BALLN MUSTANG 8X60X135 (BALLOONS) ×4
BALLOON MUSTANG 8.0X40 75 (BALLOONS) IMPLANT
BALLOON MUSTANG 8X60X135 (BALLOONS) IMPLANT
CATH ANGIO 5F BER2 65CM (CATHETERS) ×1 IMPLANT
CATH OMNI FLUSH 5F 65CM (CATHETERS) ×1 IMPLANT
DCB RANGER 5.0X40 135 (BALLOONS) IMPLANT
DEVICE CONTINUOUS FLUSH (MISCELLANEOUS) ×1 IMPLANT
GLIDEWIRE NITREX 0.018X80X5 (WIRE) ×4
GUIDEWIRE NITREX 0.018X80X5 (WIRE) IMPLANT
KIT ENCORE 26 ADVANTAGE (KITS) ×1 IMPLANT
KIT MICROPUNCTURE NIT STIFF (SHEATH) ×1 IMPLANT
KIT PV (KITS) ×4 IMPLANT
RANGER DCB 5.0X40 135 (BALLOONS) ×4
SHEATH PINNACLE 5F 10CM (SHEATH) ×2 IMPLANT
SHEATH PINNACLE 6F 10CM (SHEATH) ×1 IMPLANT
STENT EPIC VASCULAR 9X60X75 (Permanent Stent) ×1 IMPLANT
STOPCOCK MORSE 400PSI 3WAY (MISCELLANEOUS) ×1 IMPLANT
SYR MEDRAD MARK 7 150ML (SYRINGE) ×4 IMPLANT
TRANSDUCER W/STOPCOCK (MISCELLANEOUS) ×4 IMPLANT
TRAY PV CATH (CUSTOM PROCEDURE TRAY) ×4 IMPLANT
TUBING CIL FLEX 10 FLL-RA (TUBING) ×1 IMPLANT
WIRE G V18X300CM (WIRE) ×1 IMPLANT
WIRE HI TORQ VERSACORE J 260CM (WIRE) ×1 IMPLANT
WIRE STARTER BENTSON 035X150 (WIRE) ×2 IMPLANT

## 2020-06-15 NOTE — Op Note (Signed)
Patient name: James Chan MRN: [id number] DOB: 1951/04/06 Sex: male  06/15/2020 Pre-operative Diagnosis: Bilateral claudication Post-operative diagnosis:  Same Surgeon:  [name] Procedure Performed:  1.  Ultrasound-guided access, right femoral artery  2.  Ultrasound-guided access, left femoral artery  3.  Abdominal aortogram  4.  Bilateral lower extremity runoff  5.  Stent, right common iliac artery  6.  Stent, right external iliac artery  7.  Drug-coated balloon angioplasty, left superficial femoral artery  8.  Conscious sedation, 104 minutes   Indications: This is a 70 year old gentleman who has undergone multiple percutaneous interventions at a different hospital.  He has not had any follow-up.  I saw him for severe bilateral claudication.  He comes today for further evaluation.  Procedure:  The patient was identified in the holding area and taken to room 8.  The patient was then placed supine on the table and prepped and draped in the usual sterile fashion.  A time out was called.  Conscious sedation was administered with the use of IV fentanyl and Versed under continuous physician and nurse monitoring.  Heart rate, blood pressure, and oxygen saturation were continuously monitored.  Total sedation time was 104 minutes.  Ultrasound was used to evaluate the right common femoral artery.  It was patent .  A digital ultrasound image was acquired.  A micropuncture needle was used to access the right common femoral artery under ultrasound guidance.  An 018 wire was advanced without resistance and a micropuncture sheath was placed.  The 018 wire was removed and a benson wire was placed.  The micropuncture sheath was exchanged for a 5 french sheath.  An omniflush catheter was advanced over the wire to the level of L-1.  An abdominal angiogram was obtained.  Next, the Omni Flush catheter was pulled out of the aortic bifurcation and pelvic imaging was performed followed by bilateral  runoff  Findings:   Aortogram: Aneurysmal changes noted within the infrarenal abdominal aorta.  Kissing common iliac stents are noted with moderate bilateral stenosis, approximately 40%.  On the right, there did appear to be a hemodynamically significant stenosis greater than 60% within the distal right common iliac extending into the right external iliac artery.  There was a similar type process on the left but not quite as pronounced.  Right Lower Extremity: The right common femoral profundofemoral and superficial femoral artery are patent as well as their associated stents.  There is three-vessel runoff  Left Lower Extremity: Left common femoral profundofemoral artery widely patent.  The superficial femoral artery is patent.  There is a stent in the adductor canal with a approximate 95% stenosis.  The remaining portion superficial femoral as well as the popliteal artery are widely patent with three-vessel runoff  Intervention: After the above images were acquired the decision made to proceed with intervention.  I decided to treat the left superficial femoral artery stenosis since that was the most pronounced.  Therefore antegrade access of the left common femoral artery was performed under ultrasound guidance.  A 5 French sheath was then inserted.  A 6 French sheath was placed into the right groin and the patient was fully heparinized.  Through the sheath in the left groin, I advanced a V-18 wire across the stenosis within the left superficial femoral artery stent.  I treated this with a 5 x 40 drug-coated Ranger balloon for 3 minutes.  Completion imaging showed resolution of the stenosis.  Through the sheath in the right groin, the  lesion within the distal right common and proximal right external iliac artery was addressed.  I selected a 9 x 60 EPIC and landed this within the previously placed proximal common iliac stent and the stent landed into the proximal external iliac artery.  It was postdilated  with a 8 mm balloon.  Completion imaging revealed resolution of the stenosis.  At this point the wires were removed.  Patient taken over here for sheath pull once coagulation profile correct.  Impression:  #1  Approximate 70% stenosis noted in the right common and external iliac arteries.  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 [name], M.D., [facility] [place] Office: [phone] Pager:  [phone]
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
## 2020-06-15 NOTE — Progress Notes (Signed)
Site: right and left femoral Sheath Size: 90fr retrograde/ arterial - right and 69fr antegrade - left Condition prior to removal:  Level 0 at both sites Type of pressure held: manual Time pressure held: 20 minutes for each Status of patient during pull: stable Condition of site post pull:  Level 0 Type of dressing applied: gauze w/ transparent Pulses verified: Bilateral DPs dopplered Patient's condition post pull: stable Bedrest begins at: 1550 Post instructions given to patient: yes  M. Heath pulled left femoral and M. Sloan pulled right femoral

## 2020-06-15 NOTE — Progress Notes (Signed)
Up and walked and tolerated well; right groin stable, no bleeding or hematoma 

## 2020-06-15 NOTE — Discharge Instructions (Signed)
Femoral Site Care  This sheet gives you information about how to care for yourself after your procedure. Your health care provider may also give you more specific instructions. If you have problems or questions, contact your health care provider. What can I expect after the procedure? After the procedure, it is common to have:  Bruising that usually fades within 1-2 weeks.  Tenderness at the site. Follow these instructions at home: Wound care  Follow instructions from your health care provider about how to take care of your insertion site. Make sure you: ? Wash your hands with soap and water before you change your bandage (dressing). If soap and water are not available, use hand sanitizer. ? Change your dressing as told by your health care provider. ? Leave stitches (sutures), skin glue, or adhesive strips in place. These skin closures may need to stay in place for 2 weeks or longer. If adhesive strip edges start to loosen and curl up, you may trim the loose edges. Do not remove adhesive strips completely unless your health care provider tells you to do that.  Do not take baths, swim, or use a hot tub until your health care provider approves.  You may shower 24-48 hours after the procedure or as told by your health care provider. ? Gently wash the site with plain soap and water. ? Pat the area dry with a clean towel. ? Do not rub the site. This may cause bleeding.  Do not apply powder or lotion to the site. Keep the site clean and dry.  Check your femoral site every day for signs of infection. Check for: ? Redness, swelling, or pain. ? Fluid or blood. ? Warmth. ? Pus or a bad smell. Activity  For the first 2-3 days after your procedure, or as long as directed: ? Avoid climbing stairs as much as possible. ? Do not squat.  Do not lift anything that is heavier than 10 lb (4.5 kg), or the limit that you are told, until your health care provider says that it is safe.  Rest as  directed. ? Avoid sitting for a long time without moving. Get up to take short walks every 1-2 hours.  Do not drive for 24 hours if you were given a medicine to help you relax (sedative). General instructions  Take over-the-counter and prescription medicines only as told by your health care provider.  Keep all follow-up visits as told by your health care provider. This is important. Contact a health care provider if you have:  A fever or chills.  You have redness, swelling, or pain around your insertion site. Get help right away if:  The catheter insertion area swells very fast.  You pass out.  You suddenly start to sweat or your skin gets clammy.  The catheter insertion area is bleeding, and the bleeding does not stop when you hold steady pressure on the area.  The area near or just beyond the catheter insertion site becomes pale, cool, tingly, or numb. These symptoms may represent a serious problem that is an emergency. Do not wait to see if the symptoms will go away. Get medical help right away. Call your local emergency services (911 in the [place] Do not drive yourself to the hospital. Summary  After the procedure, it is common to have bruising that usually fades within 1-2 weeks.  Check your femoral site every day for signs of infection.  Do not lift anything that is heavier than 10 lb (4.5 kg), or   the limit that you are told, until your health care provider says that it is safe. This information is not intended to replace advice given to you by your health care provider. Make sure you discuss any questions you have with your health care provider. Document Revised: 12/12/2019 Document Reviewed: 12/12/2019 Elsevier Patient Education  2021 [employer]  

## 2020-06-16 ENCOUNTER — Encounter (HOSPITAL_COMMUNITY): Payer: Self-pay | Admitting: Surgery

## 2020-06-17 ENCOUNTER — Encounter (HOSPITAL_COMMUNITY): Payer: Self-pay | Admitting: Surgery
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
## 2020-07-06 ENCOUNTER — Other Ambulatory Visit: Payer: Self-pay

## 2020-07-06 ENCOUNTER — Ambulatory Visit: Payer: Medicare Other | Admitting: Cardiology

## 2020-07-06 ENCOUNTER — Encounter: Payer: Self-pay | Admitting: Cardiology

## 2020-07-06 ENCOUNTER — Ambulatory Visit (INDEPENDENT_AMBULATORY_CARE_PROVIDER_SITE_OTHER): Payer: Medicare Other

## 2020-07-06 VITALS — BP 140/62 | HR 71 | Ht 72.0 in | Wt 171.2 lb

## 2020-07-06 DIAGNOSIS — Z79899 Other long term (current) drug therapy: Secondary | ICD-10-CM

## 2020-07-06 DIAGNOSIS — Z7901 Long term (current) use of anticoagulants: Secondary | ICD-10-CM | POA: Diagnosis not present

## 2020-07-06 DIAGNOSIS — Z95810 Presence of automatic (implantable) cardiac defibrillator: Secondary | ICD-10-CM

## 2020-07-06 DIAGNOSIS — I480 Paroxysmal atrial fibrillation: Secondary | ICD-10-CM

## 2020-07-06 DIAGNOSIS — I9589 Other hypotension: Secondary | ICD-10-CM

## 2020-07-06 DIAGNOSIS — I5042 Chronic combined systolic (congestive) and diastolic (congestive) heart failure: Secondary | ICD-10-CM

## 2020-07-06 DIAGNOSIS — I472 Ventricular tachycardia, unspecified: Secondary | ICD-10-CM

## 2020-07-06 DIAGNOSIS — I110 Hypertensive heart disease with heart failure: Secondary | ICD-10-CM

## 2020-07-06 NOTE — Patient Instructions (Signed)
Medication Instructions:  °Your physician recommends that you continue on your current medications as directed. Please refer to the Current Medication list given to you today. ° °*If you need a refill on your cardiac medications before your next appointment, please call your pharmacy* ° ° °Lab Work: °None °If you have labs (blood work) drawn today and your tests are completely normal, you will receive your results only by: °MyChart Message (if you have MyChart) OR °A paper copy in the mail °If you have any lab test that is abnormal or we need to change your treatment, we will call you to review the results. ° ° °Testing/Procedures: °A zio monitor was ordered today. It will remain on for 7 days. You will then return monitor and event diary in provided box. It takes 1-2 weeks for report to be downloaded and returned to us. We will call you with the results. If monitor falls off or has orange flashing light, please call Zio for further instructions.  ° ° ° °Follow-Up: °At [facility], you and your health needs are our priority.  As part of our continuing mission to provide you with exceptional heart care, we have created designated Provider Care Teams.  These Care Teams include your primary Cardiologist (physician) and Advanced Practice Providers (APPs -  Physician Assistants and Nurse Practitioners) who all work together to provide you with the care you need, when you need it. ° °We recommend signing up for the patient portal called "MyChart".  Sign up information is provided on this After Visit Summary.  MyChart is used to connect with patients for Virtual Visits (Telemedicine).  Patients are able to view lab/test results, encounter notes, upcoming appointments, etc.  Non-urgent messages can be sent to your provider as well.   °To learn more about what you can do with MyChart, go to [email or web address].   ° °Your next appointment:   °4 week(s) ° °The format for your next appointment:   °In Person ° °Provider:    °Brian Munley, MD  ° ° °Other Instructions ° ° °

## 2020-07-13 DIAGNOSIS — I480 Paroxysmal atrial fibrillation: Secondary | ICD-10-CM | POA: Diagnosis not present

## 2020-07-19 ENCOUNTER — Other Ambulatory Visit: Payer: Self-pay

## 2020-07-19 DIAGNOSIS — I714 Abdominal aortic aneurysm, without rupture, unspecified: Secondary | ICD-10-CM

## 2020-07-19 DIAGNOSIS — I70213 Atherosclerosis of native arteries of extremities with intermittent claudication, bilateral legs: Secondary | ICD-10-CM

## 2020-07-22 ENCOUNTER — Telehealth: Payer: Self-pay

## 2020-07-22 NOTE — Telephone Encounter (Signed)
Spoke with patient regarding results and recommendation.  Patient verbalizes understanding and is agreeable to plan of care. Advised patient to call back with any issues or concerns.  

## 2020-07-22 NOTE — Telephone Encounter (Signed)
-----   Message from Richardo Priest, MD sent at 07/22/2020  8:03 AM EDT ----- This is a good result he was worried he need pacemaker I do not see anything that indicates that.

## 2020-07-26 ENCOUNTER — Ambulatory Visit (INDEPENDENT_AMBULATORY_CARE_PROVIDER_SITE_OTHER)
Admission: RE | Admit: 2020-07-26 | Discharge: 2020-07-26 | Disposition: A | Discharge status: Home / Self Care | Payer: Medicare Other | Source: Ambulatory Visit | Attending: Surgery | Admitting: Surgery

## 2020-07-26 ENCOUNTER — Other Ambulatory Visit: Payer: Self-pay

## 2020-07-26 ENCOUNTER — Ambulatory Visit (HOSPITAL_COMMUNITY)
Admission: RE | Admit: 2020-07-26 | Discharge: 2020-07-26 | Disposition: A | Discharge status: Home / Self Care | Payer: Medicare Other | Source: Ambulatory Visit | Attending: Surgery | Admitting: Surgery

## 2020-07-26 ENCOUNTER — Encounter: Payer: Self-pay | Admitting: Physician Assistant

## 2020-07-26 ENCOUNTER — Ambulatory Visit (INDEPENDENT_AMBULATORY_CARE_PROVIDER_SITE_OTHER): Payer: Medicare Other | Admitting: Physician Assistant

## 2020-07-26 VITALS — BP 159/68 | HR 63 | Temp 98.6°F | Resp 20 | Ht 72.0 in | Wt 173.9 lb

## 2020-07-26 DIAGNOSIS — I70213 Atherosclerosis of native arteries of extremities with intermittent claudication, bilateral legs: Secondary | ICD-10-CM

## 2020-07-26 DIAGNOSIS — I714 Abdominal aortic aneurysm, without rupture, unspecified: Secondary | ICD-10-CM
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
## 2020-07-27 ENCOUNTER — Other Ambulatory Visit: Payer: Self-pay

## 2020-07-27 DIAGNOSIS — I70213 Atherosclerosis of native arteries of extremities with intermittent claudication, bilateral legs: Secondary | ICD-10-CM
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
## 2020-07-29 ENCOUNTER — Encounter: Payer: Self-pay | Admitting: Cardiology

## 2020-07-29 ENCOUNTER — Ambulatory Visit: Payer: Medicare Other | Admitting: Cardiology

## 2020-07-29 ENCOUNTER — Other Ambulatory Visit: Payer: Self-pay

## 2020-07-29 VITALS — BP 132/56 | HR 68 | Ht 72.0 in | Wt 174.6 lb

## 2020-07-29 DIAGNOSIS — I480 Paroxysmal atrial fibrillation: Secondary | ICD-10-CM | POA: Diagnosis not present

## 2020-07-29 DIAGNOSIS — Z95810 Presence of automatic (implantable) cardiac defibrillator: Secondary | ICD-10-CM | POA: Diagnosis not present

## 2020-07-29 DIAGNOSIS — I5042 Chronic combined systolic (congestive) and diastolic (congestive) heart failure: Secondary | ICD-10-CM

## 2020-07-29 DIAGNOSIS — N185 Chronic kidney disease, stage 5: Secondary | ICD-10-CM

## 2020-07-29 DIAGNOSIS — E782 Mixed hyperlipidemia: Secondary | ICD-10-CM

## 2020-07-29 DIAGNOSIS — Z79899 Other long term (current) drug therapy: Secondary | ICD-10-CM | POA: Diagnosis not present

## 2020-07-29 DIAGNOSIS — I110 Hypertensive heart disease with heart failure: Secondary | ICD-10-CM

## 2020-07-29 DIAGNOSIS — Z7901 Long term (current) use of anticoagulants: Secondary | ICD-10-CM

## 2020-07-29 DIAGNOSIS — I472 Ventricular tachycardia, unspecified: Secondary | ICD-10-CM

## 2020-07-29 NOTE — Patient Instructions (Signed)
Medication Instructions:  Your physician recommends that you continue on your current medications as directed. Please refer to the Current Medication list given to you today.  *If you need a refill on your cardiac medications before your next appointment, please call your pharmacy*   Lab Work: None If you have labs (blood work) drawn today and your tests are completely normal, you will receive your results only by: Marland Kitchen MyChart Message (if you have MyChart) OR . A paper copy in the mail If you have any lab test that is abnormal or we need to change your treatment, we will call you to review the results.   Testing/Procedures: Your physician has requested that you have an echocardiogram. Echocardiography is a painless test that uses sound waves to create images of your heart. It provides your doctor with information about the size and shape of your heart and how well your heart's chambers and valves are working. This procedure takes approximately one hour. There are no restrictions for this procedure.     Follow-Up: At [facility], you and your health needs are our priority.  As part of our continuing mission to provide you with exceptional heart care, we have created designated Provider Care Teams.  These Care Teams include your primary Cardiologist (physician) and Advanced Practice Providers (APPs -  Physician Assistants and Nurse Practitioners) who all work together to provide you with the care you need, when you need it.  We recommend signing up for the patient portal called "MyChart".  Sign up information is provided on this After Visit Summary.  MyChart is used to connect with patients for Virtual Visits (Telemedicine).  Patients are able to view lab/test results, encounter notes, upcoming appointments, etc.  Non-urgent messages can be sent to your provider as well.   To learn more about what you can do with MyChart, go to [email or web address].    Your next appointment:   3  month(s)  The format for your next appointment:   In Person  Provider:   Shirlee More, MD   Other Instructions

## 2020-08-24 ENCOUNTER — Other Ambulatory Visit: Payer: Self-pay

## 2020-08-24 ENCOUNTER — Ambulatory Visit (INDEPENDENT_AMBULATORY_CARE_PROVIDER_SITE_OTHER): Payer: Medicare Other

## 2020-08-24 DIAGNOSIS — I5042 Chronic combined systolic (congestive) and diastolic (congestive) heart failure: Secondary | ICD-10-CM | POA: Diagnosis not present

## 2020-08-24 DIAGNOSIS — I480 Paroxysmal atrial fibrillation: Secondary | ICD-10-CM | POA: Diagnosis not present

## 2020-08-24 DIAGNOSIS — I110 Hypertensive heart disease with heart failure: Secondary | ICD-10-CM | POA: Diagnosis not present

## 2020-08-24 DIAGNOSIS — Z7901 Long term (current) use of anticoagulants: Secondary | ICD-10-CM

## 2020-08-24 DIAGNOSIS — N185 Chronic kidney disease, stage 5: Secondary | ICD-10-CM

## 2020-08-24 DIAGNOSIS — Z95810 Presence of automatic (implantable) cardiac defibrillator: Secondary | ICD-10-CM

## 2020-08-24 DIAGNOSIS — I472 Ventricular tachycardia, unspecified: Secondary | ICD-10-CM

## 2020-08-24 DIAGNOSIS — E782 Mixed hyperlipidemia: Secondary | ICD-10-CM

## 2020-08-24 DIAGNOSIS — Z79899 Other long term (current) drug therapy: Secondary | ICD-10-CM

## 2020-08-24 LAB — ECHOCARDIOGRAM COMPLETE
Area-P 1/2: 4.54 cm2
Calc EF: 29.2 %
MV M vel: 4.81 m/s
MV Peak grad: 92.5 mmHg
Radius: 0.7 cm
S' Lateral: 5.6 cm
Single Plane A2C EF: 29.2 %
Single Plane A4C EF: 26.8 %

## 2020-08-24 NOTE — Progress Notes (Signed)
Complete echocardiogram performed.  Jimmy Deryn Massengale RDCS, RVT  

## 2020-08-29 IMAGING — DX DG CHEST 2V
2 series · 2 of 2 positions shown · non-contrast
Comparison: 02/05/2018 and earlier.

CLINICAL DATA: 67-year-old male with shortness of breath,
generalized pain.

EXAM:
CHEST - 2 VIEW

[chest lat]
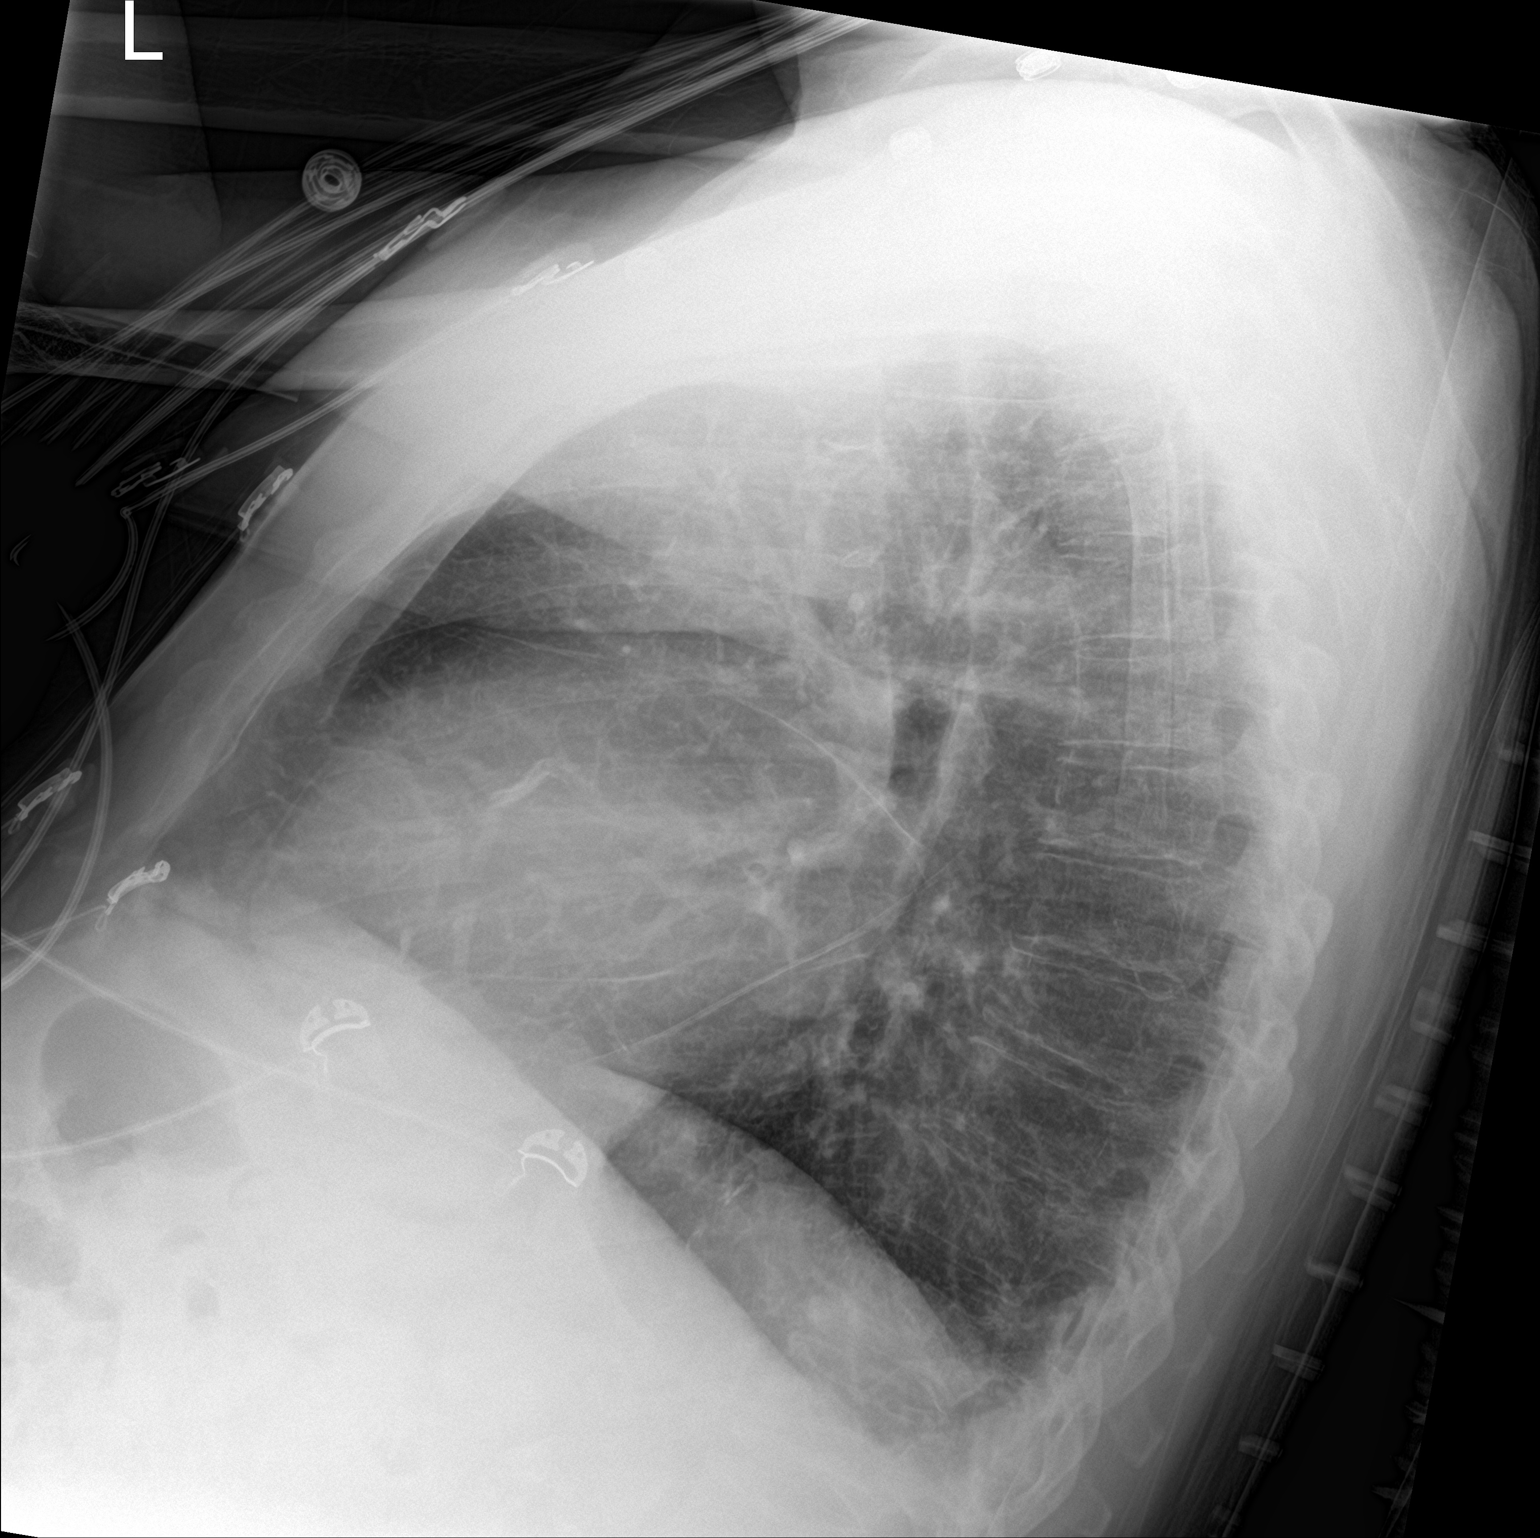

[chest ap]
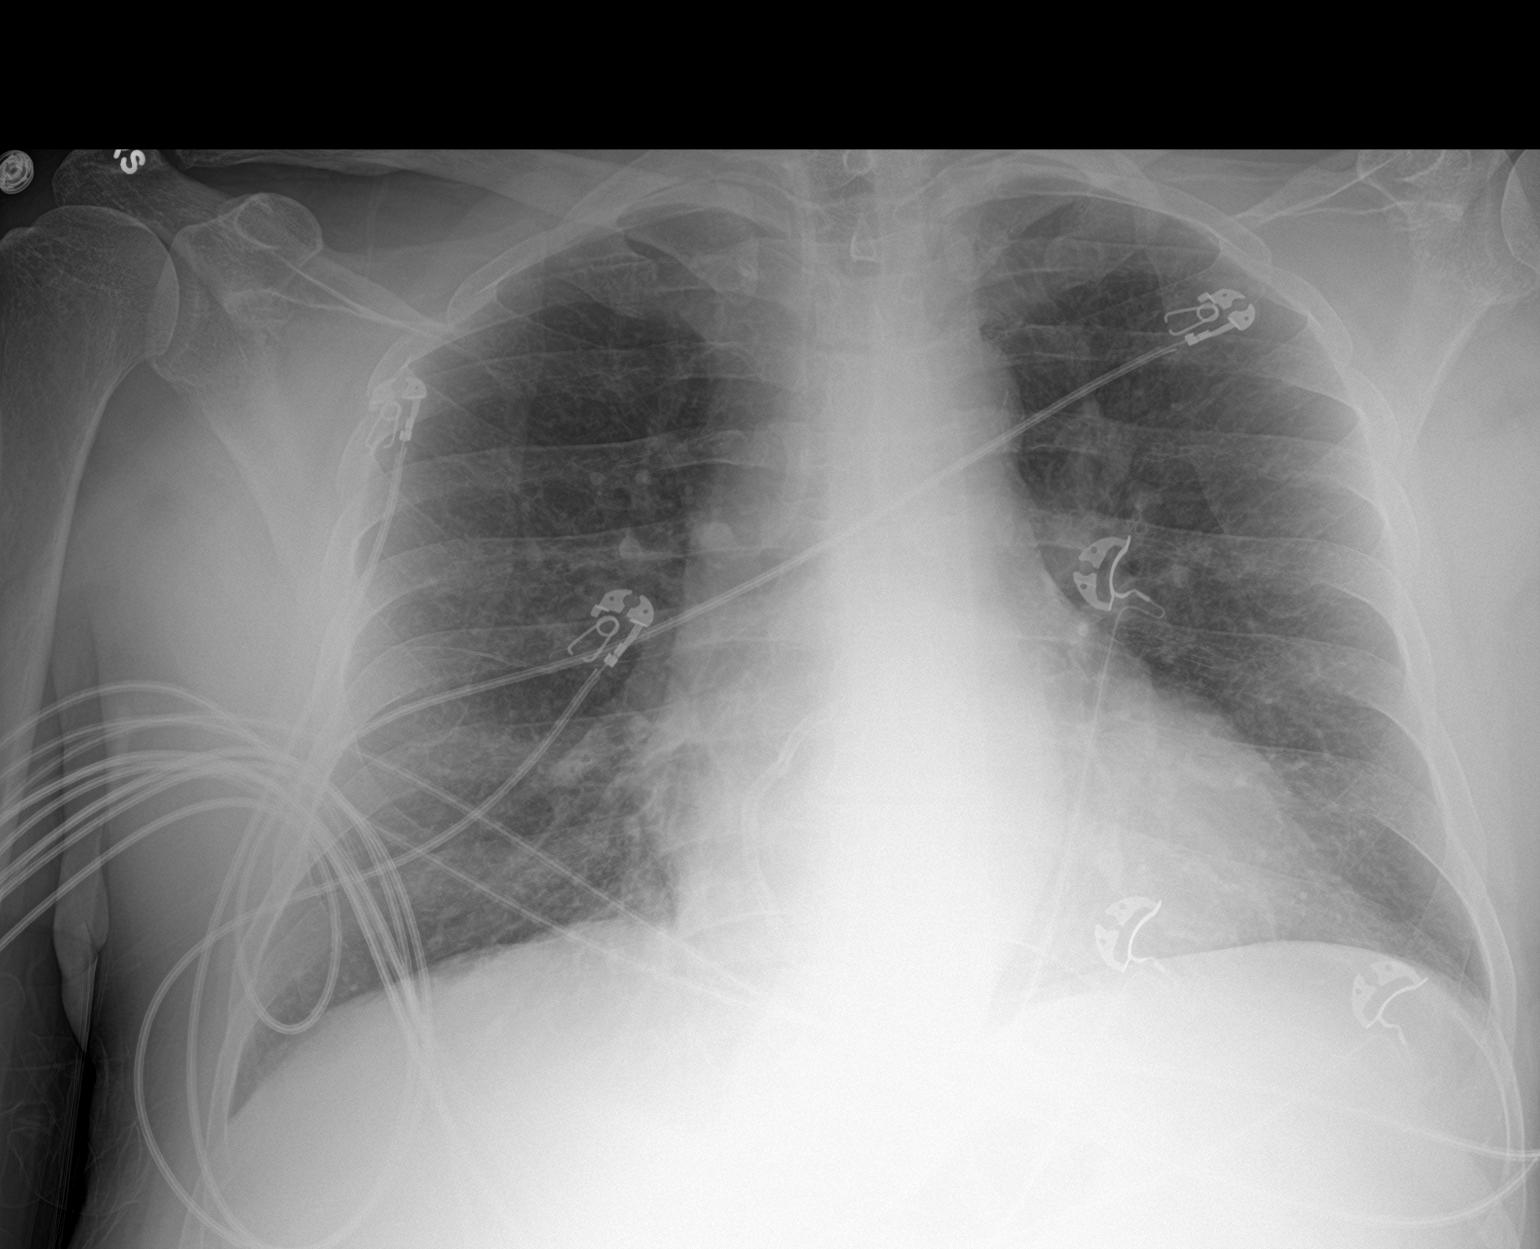

[2 of 2 positions shown; findings below may reference images not displayed]

FINDINGS: Right coronary artery stent(s) redemonstrated.

Cardiac size remains at the upper limits of normal. Other
mediastinal contours are within normal limits. Visualized tracheal
air column is within normal limits. No pneumothorax, pulmonary
edema, pleural effusion or confluent pulmonary opacity. Osteopenia.
No acute osseous abnormality identified. Negative visible bowel gas
pattern.
IMPRESSION: No acute cardiopulmonary abnormality.

## 2020-08-30 ENCOUNTER — Ambulatory Visit (INDEPENDENT_AMBULATORY_CARE_PROVIDER_SITE_OTHER): Payer: Medicare Other

## 2020-08-30 ENCOUNTER — Telehealth: Payer: Self-pay

## 2020-08-30 DIAGNOSIS — I472 Ventricular tachycardia, unspecified: Secondary | ICD-10-CM

## 2020-08-30 NOTE — Telephone Encounter (Signed)
Pt called in returning [facility] call   Best number [phone]

## 2020-08-30 NOTE — Telephone Encounter (Signed)
-----   Message from Richardo Priest, MD sent at 08/27/2020  1:20 PM EDT ----- Unfortunately although his heart failure is controlled on meds and dialysis. Function-EF% remains severely reduced Continue current meds

## 2020-08-30 NOTE — Telephone Encounter (Signed)
Left message on patients voicemail to please return our call.   

## 2020-08-30 NOTE — Telephone Encounter (Signed)
Spoke with patient regarding results and recommendation.  Patient verbalizes understanding and is agreeable to plan of care. Advised patient to call back with any issues or concerns.  

## 2020-08-31 ENCOUNTER — Telehealth: Payer: Self-pay | Admitting: Cardiology

## 2020-08-31 NOTE — Telephone Encounter (Signed)
Spoke to the patient just now and he let me know that he has a cough at this time that seems to be getting worse. He also is having trouble sleeping at night. I advised that he needed to be seen by his PCP in regards to this as they would be better fit to treat these issues. He verbalizes understanding and thanks me for the call back.

## 2020-08-31 NOTE — Telephone Encounter (Signed)
Pt c/o Shortness Of Breath: STAT if SOB developed within the last 24 hours or pt is noticeably SOB on the phone  1. Are you currently SOB (can you hear that pt is SOB on the phone)?  No   2. How long have you been experiencing SOB?  About 1 month  3. Are you SOB when sitting or when up moving around?  When up and moving around   4. Are you currently experiencing any other symptoms?  Cough

## 2020-09-01 LAB — CUP PACEART REMOTE DEVICE CHECK
Battery Remaining Percentage: 94 %
Date Time Interrogation Session: 20220510174900
Implantable Lead Implant Date: 20210923
Implantable Lead Location: 753862
Implantable Lead Model: 3501
Implantable Lead Serial Number: 194255
Implantable Pulse Generator Implant Date: 20210923
Pulse Gen Serial Number: 140488

## 2020-09-02 ENCOUNTER — Other Ambulatory Visit: Payer: Self-pay | Admitting: Cardiology

## 2020-09-16 HISTORY — DX: Other disorders of phosphorus metabolism: E83.39

## 2020-09-21 ENCOUNTER — Other Ambulatory Visit: Payer: Self-pay | Admitting: Cardiology

## 2020-09-21 NOTE — Telephone Encounter (Signed)
Refill sent to pharmacy.   

## 2020-09-21 NOTE — Progress Notes (Signed)
Remote ICD transmission.   

## 2020-09-23 ENCOUNTER — Other Ambulatory Visit: Payer: Self-pay

## 2020-09-23 ENCOUNTER — Encounter: Payer: Self-pay | Admitting: Podiatry

## 2020-09-23 ENCOUNTER — Ambulatory Visit: Payer: Medicare Other | Admitting: Podiatry

## 2020-09-23 DIAGNOSIS — E1142 Type 2 diabetes mellitus with diabetic polyneuropathy: Secondary | ICD-10-CM | POA: Diagnosis not present

## 2020-09-23 DIAGNOSIS — B351 Tinea unguium: Secondary | ICD-10-CM | POA: Diagnosis not present

## 2020-09-23 DIAGNOSIS — N186 End stage renal disease: Secondary | ICD-10-CM

## 2020-09-23 DIAGNOSIS — M79674 Pain in right toe(s): Secondary | ICD-10-CM

## 2020-09-23 DIAGNOSIS — M79675 Pain in left toe(s): Secondary | ICD-10-CM

## 2020-09-23 DIAGNOSIS — Z992 Dependence on renal dialysis: Secondary | ICD-10-CM | POA: Diagnosis not present

## 2020-09-23 NOTE — Patient Instructions (Signed)
Recommend Skechers Loafers with stretchable uppers and memory foam insoles. They can be purchased at your local shoe store and also on [email or web address].

## 2020-09-23 NOTE — Progress Notes (Signed)
  Subjective:  Patient ID: James Chan, male    DOB: [birth year],  MRN: [id number]  70 y.o. male presents with at risk foot care with h/o NIDDM with ESRD on hemodialysis and painful thick toenails that are difficult to trim. Pain interferes with ambulation. Aggravating factors include wearing enclosed shoe gear. Pain is relieved with periodic professional debridement..    Patient's blood sugar was 107 mg/dl today.  PCP: Cher Nakai, MD and last visit was: one week ago.  Review of Systems: Negative except as noted in the HPI.   No Known Allergies  Objective:  There were no vitals filed for this visit. Constitutional Patient is a pleasant 70 y.o. Caucasian male WD, WN in NAD. AAO x 3.  Vascular Capillary fill time to digits <3 seconds b/l lower extremities. Faintly palpable pedal pulses b/l. Pedal hair absent. Lower extremity skin temperature gradient within normal limits. No pain with calf compression b/l. No edema noted b/l lower extremities. No cyanosis or clubbing noted.  Neurologic Normal speech. Pt has subjective symptoms of neuropathy. Protective sensation intact 5/5 intact bilaterally with 10g monofilament b/l.  Dermatologic Pedal skin with normal turgor, texture and tone bilaterally. No open wounds bilaterally. No interdigital macerations bilaterally. Toenails 1-5 b/l elongated, discolored, dystrophic, thickened, crumbly with subungual debris and tenderness to dorsal palpation. Right hallux nail has grown back and shows evidence of distal tip nailplate being embedded with mild hyperkeratosis.  Orthopedic: Normal muscle strength 5/5 to all lower extremity muscle groups bilaterally. No pain crepitus or joint limitation noted with ROM b/l. No gross bony deformities bilaterally.   Hemoglobin A1C Latest Ref Rng & Units 01/14/2020  HGBA1C 4.8 - 5.6 % 6.4(H)  Some recent data might be hidden   Assessment:   1. Pain due to onychomycosis of toenails of both feet   2. Diabetic  polyneuropathy associated with type 2 diabetes mellitus ([place])   3. ESRD on dialysis [facility])    Plan:  Patient was evaluated and treated and all questions answered.  Onychomycosis with pain -Nails palliatively debridement as below. -Educated on self-care  Procedure: Nail Debridement Rationale: Pain Type of Debridement: manual, sharp debridement. Instrumentation: Nail nipper, rotary burr. Number of Nails: 10  -Examined patient. -Patient to continue soft, supportive shoe gear daily. -Toenails 1-5 b/l were debrided in length and girth with sterile nail nippers and dremel without iatrogenic bleeding.  -Patient to report any pedal injuries to medical professional immediately. -Curretaged distal edge of right great toe nailplate. Applied TAO. Patient instructed to apply Vaseline Petroleum Jelly to distal edge daily. If he has any problems, he is to contact us at the office. -Patient/POA to call should there be question/concern in the interim.  Return in about 3 months (around 12/24/2020).  Marzetta Board, DPM

## 2020-10-14 ENCOUNTER — Encounter: Payer: Medicare Other | Admitting: Internal Medicine

## 2020-10-28 ENCOUNTER — Ambulatory Visit: Payer: Medicare Other | Admitting: Cardiology

## 2020-11-29 ENCOUNTER — Ambulatory Visit: Payer: Medicare Other

## 2020-12-23 ENCOUNTER — Ambulatory Visit: Payer: Medicare Other | Admitting: Podiatry

## 2021-01-26 ENCOUNTER — Encounter: Payer: Self-pay | Admitting: Cardiology

## 2021-01-26 ENCOUNTER — Ambulatory Visit (INDEPENDENT_AMBULATORY_CARE_PROVIDER_SITE_OTHER): Payer: Medicare Other | Admitting: Cardiology

## 2021-01-26 ENCOUNTER — Other Ambulatory Visit: Payer: Self-pay

## 2021-01-26 VITALS — BP 137/56 | HR 62 | Ht 72.0 in | Wt 174.6 lb

## 2021-01-26 DIAGNOSIS — I480 Paroxysmal atrial fibrillation: Secondary | ICD-10-CM

## 2021-01-26 DIAGNOSIS — Z79899 Other long term (current) drug therapy: Secondary | ICD-10-CM

## 2021-01-26 DIAGNOSIS — Z95810 Presence of automatic (implantable) cardiac defibrillator: Secondary | ICD-10-CM

## 2021-01-26 DIAGNOSIS — I5022 Chronic systolic (congestive) heart failure: Secondary | ICD-10-CM

## 2021-01-26 DIAGNOSIS — N185 Chronic kidney disease, stage 5: Secondary | ICD-10-CM

## 2021-01-26 DIAGNOSIS — Z7901 Long term (current) use of anticoagulants: Secondary | ICD-10-CM

## 2021-01-26 DIAGNOSIS — E782 Mixed hyperlipidemia: Secondary | ICD-10-CM

## 2021-01-26 DIAGNOSIS — I110 Hypertensive heart disease with heart failure: Secondary | ICD-10-CM

## 2021-01-26 DIAGNOSIS — I472 Ventricular tachycardia, unspecified: Secondary | ICD-10-CM | POA: Diagnosis not present

## 2021-01-26 DIAGNOSIS — I2510 Atherosclerotic heart disease of native coronary artery without angina pectoris: Secondary | ICD-10-CM
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
## 2021-01-26 NOTE — Patient Instructions (Signed)

## 2021-02-03 LAB — CUP PACEART REMOTE DEVICE CHECK
Battery Remaining Percentage: 91 %
Date Time Interrogation Session: 20220809212200
Implantable Lead Implant Date: 20210923
Implantable Lead Location: 753862
Implantable Lead Model: 3501
Implantable Lead Serial Number: 194255
Implantable Pulse Generator Implant Date: 20210923
Pulse Gen Serial Number: 140488

## 2021-02-08 ENCOUNTER — Encounter: Payer: Medicare Other | Admitting: Internal Medicine

## 2021-02-15 ENCOUNTER — Other Ambulatory Visit: Payer: Self-pay

## 2021-02-15 ENCOUNTER — Ambulatory Visit (INDEPENDENT_AMBULATORY_CARE_PROVIDER_SITE_OTHER): Payer: Medicare Other

## 2021-02-15 DIAGNOSIS — N185 Chronic kidney disease, stage 5: Secondary | ICD-10-CM

## 2021-02-15 DIAGNOSIS — Z79899 Other long term (current) drug therapy: Secondary | ICD-10-CM

## 2021-02-15 DIAGNOSIS — I2510 Atherosclerotic heart disease of native coronary artery without angina pectoris: Secondary | ICD-10-CM

## 2021-02-15 DIAGNOSIS — I472 Ventricular tachycardia, unspecified: Secondary | ICD-10-CM

## 2021-02-15 DIAGNOSIS — Z95810 Presence of automatic (implantable) cardiac defibrillator: Secondary | ICD-10-CM

## 2021-02-15 DIAGNOSIS — I480 Paroxysmal atrial fibrillation: Secondary | ICD-10-CM

## 2021-02-15 DIAGNOSIS — I5022 Chronic systolic (congestive) heart failure: Secondary | ICD-10-CM

## 2021-02-15 DIAGNOSIS — E782 Mixed hyperlipidemia: Secondary | ICD-10-CM

## 2021-02-15 DIAGNOSIS — Z7901 Long term (current) use of anticoagulants: Secondary | ICD-10-CM

## 2021-02-15 DIAGNOSIS — I110 Hypertensive heart disease with heart failure: Secondary | ICD-10-CM

## 2021-02-15 LAB — ECHOCARDIOGRAM COMPLETE
Area-P 1/2: 5.38 cm2
Calc EF: 28 %
S' Lateral: 5.6 cm
Single Plane A2C EF: 30.7 %
Single Plane A4C EF: 27.3 %

## 2021-02-15 MED ORDER — PERFLUTREN LIPID MICROSPHERE
1.0000 mL | INTRAVENOUS | Status: AC | PRN
  Administered 2021-02-15: 6 mL via INTRAVENOUS

## 2021-02-16 ENCOUNTER — Telehealth: Payer: Self-pay | Admitting: Emergency Medicine

## 2021-02-16 MED ORDER — ENTRESTO 24-26 MG PO TABS: ORAL_TABLET | ORAL | 2 refills | Status: DC

## 2021-02-16 NOTE — Telephone Encounter (Signed)
-----   Message from Richardo Priest, MD sent at 02/16/2021  7:20 AM EDT ----- His heart muscle function remains severely reduced  Continue the same dosing schedule for hydralazine but reduced from 50 to 25 mg  Initiate Entresto 24-26 twice daily do not take in the morning the day of dialysis

## 2021-02-16 NOTE — Telephone Encounter (Signed)
Spoke to patient. Informed him of results. He will decrease hydralazine to 25mg  twice a day on days of dialysis (MWF) and  25 mg three times a day all other days. Also he will start entresto 24/26 mg twice daily. Holding morning dose on dialysis days. Patient understood no further questions.

## 2021-02-28 ENCOUNTER — Ambulatory Visit (INDEPENDENT_AMBULATORY_CARE_PROVIDER_SITE_OTHER): Payer: Medicare Other

## 2021-02-28 DIAGNOSIS — I472 Ventricular tachycardia, unspecified: Secondary | ICD-10-CM

## 2021-03-01 LAB — CUP PACEART REMOTE DEVICE CHECK
Battery Remaining Percentage: 88 %
Date Time Interrogation Session: 20221107193000
Implantable Lead Implant Date: 20210923
Implantable Lead Location: 753862
Implantable Lead Model: 3501
Implantable Lead Serial Number: 194255
Implantable Pulse Generator Implant Date: 20210923
Pulse Gen Serial Number: 140488

## 2021-03-07 NOTE — Progress Notes (Signed)
Remote ICD transmission.   

## 2021-03-22 ENCOUNTER — Telehealth: Payer: Self-pay

## 2021-03-22 NOTE — Telephone Encounter (Signed)
B[facility] alert for untreated VT 11/21 @ 15:27 Event 003 on Latitude site show untreated 15-16sec of sustained multi-focal VT Conditional shock zone 200 bpm Shock zone 220 bpm Route to triage for review LR  Successful telephone encounter to patient to follow up on VT episode 03/14/21. Reviewed with industry. Device charged however patient spontaneously converted to SR just before shock. Patient states he was symptomatic and passed out, hitting nose on hot water heater. He did not call 911. States he remembers getting dizzy but does not remember the fall or his wife "slapping his face trying to revive me". Patient last see in clinic 04/2020. BB has since been discontinued. Compliant with low dose amio at 200mg  daily. He is advised of Obert restrictions secondary to syncopal episode. Shock plan reviewed. ED precautions given. Will route to Dr. Bettina Gavia and Lovena Le for advisement.
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
## 2021-03-25 DIAGNOSIS — D509 Iron deficiency anemia, unspecified: Secondary | ICD-10-CM | POA: Diagnosis not present

## 2021-03-25 DIAGNOSIS — N2581 Secondary hyperparathyroidism of renal origin: Secondary | ICD-10-CM | POA: Diagnosis not present

## 2021-03-25 DIAGNOSIS — D631 Anemia in chronic kidney disease: Secondary | ICD-10-CM | POA: Diagnosis not present

## 2021-03-25 DIAGNOSIS — E1129 Type 2 diabetes mellitus with other diabetic kidney complication: Secondary | ICD-10-CM | POA: Diagnosis not present

## 2021-03-25 DIAGNOSIS — Z992 Dependence on renal dialysis: Secondary | ICD-10-CM | POA: Diagnosis not present

## 2021-03-25 DIAGNOSIS — N186 End stage renal disease: Secondary | ICD-10-CM | POA: Diagnosis not present

## 2021-03-29 ENCOUNTER — Encounter (HOSPITAL_COMMUNITY): Payer: Medicare Other

## 2021-03-29 ENCOUNTER — Ambulatory Visit: Payer: Medicare Other

## 2021-03-29 ENCOUNTER — Ambulatory Visit (HOSPITAL_COMMUNITY): Payer: Medicare Other

## 2021-03-29 DIAGNOSIS — N2581 Secondary hyperparathyroidism of renal origin: Secondary | ICD-10-CM | POA: Diagnosis not present

## 2021-03-29 DIAGNOSIS — Z992 Dependence on renal dialysis: Secondary | ICD-10-CM | POA: Diagnosis not present

## 2021-03-29 DIAGNOSIS — D631 Anemia in chronic kidney disease: Secondary | ICD-10-CM | POA: Diagnosis not present

## 2021-03-29 DIAGNOSIS — E1129 Type 2 diabetes mellitus with other diabetic kidney complication: Secondary | ICD-10-CM | POA: Diagnosis not present

## 2021-03-29 DIAGNOSIS — D509 Iron deficiency anemia, unspecified: Secondary | ICD-10-CM | POA: Diagnosis not present

## 2021-03-29 DIAGNOSIS — N186 End stage renal disease: Secondary | ICD-10-CM | POA: Diagnosis not present

## 2021-03-30 DIAGNOSIS — Z992 Dependence on renal dialysis: Secondary | ICD-10-CM | POA: Diagnosis not present

## 2021-03-30 DIAGNOSIS — N2581 Secondary hyperparathyroidism of renal origin: Secondary | ICD-10-CM | POA: Diagnosis not present

## 2021-03-30 DIAGNOSIS — N186 End stage renal disease: Secondary | ICD-10-CM | POA: Diagnosis not present

## 2021-03-30 DIAGNOSIS — D631 Anemia in chronic kidney disease: Secondary | ICD-10-CM | POA: Diagnosis not present

## 2021-03-30 DIAGNOSIS — D509 Iron deficiency anemia, unspecified: Secondary | ICD-10-CM | POA: Diagnosis not present

## 2021-03-30 DIAGNOSIS — E1129 Type 2 diabetes mellitus with other diabetic kidney complication: Secondary | ICD-10-CM | POA: Diagnosis not present

## 2021-03-31 DIAGNOSIS — E1140 Type 2 diabetes mellitus with diabetic neuropathy, unspecified: Secondary | ICD-10-CM | POA: Diagnosis not present

## 2021-03-31 DIAGNOSIS — R609 Edema, unspecified: Secondary | ICD-10-CM | POA: Diagnosis not present

## 2021-03-31 DIAGNOSIS — E785 Hyperlipidemia, unspecified: Secondary | ICD-10-CM | POA: Diagnosis not present

## 2021-03-31 DIAGNOSIS — E113299 Type 2 diabetes mellitus with mild nonproliferative diabetic retinopathy without macular edema, unspecified eye: Secondary | ICD-10-CM | POA: Diagnosis not present

## 2021-03-31 DIAGNOSIS — R809 Proteinuria, unspecified: Secondary | ICD-10-CM | POA: Diagnosis not present

## 2021-03-31 DIAGNOSIS — E11319 Type 2 diabetes mellitus with unspecified diabetic retinopathy without macular edema: Secondary | ICD-10-CM | POA: Diagnosis not present

## 2021-03-31 DIAGNOSIS — M545 Low back pain, unspecified: Secondary | ICD-10-CM | POA: Diagnosis not present

## 2021-03-31 DIAGNOSIS — I10 Essential (primary) hypertension: Secondary | ICD-10-CM | POA: Diagnosis not present

## 2021-03-31 DIAGNOSIS — Z992 Dependence on renal dialysis: Secondary | ICD-10-CM | POA: Diagnosis not present

## 2021-03-31 DIAGNOSIS — G8929 Other chronic pain: Secondary | ICD-10-CM | POA: Diagnosis not present

## 2021-03-31 DIAGNOSIS — K219 Gastro-esophageal reflux disease without esophagitis: Secondary | ICD-10-CM | POA: Diagnosis not present

## 2021-03-31 DIAGNOSIS — N186 End stage renal disease: Secondary | ICD-10-CM | POA: Diagnosis not present

## 2021-03-31 DIAGNOSIS — E781 Pure hyperglyceridemia: Secondary | ICD-10-CM | POA: Diagnosis not present

## 2021-04-01 DIAGNOSIS — D509 Iron deficiency anemia, unspecified: Secondary | ICD-10-CM | POA: Diagnosis not present

## 2021-04-01 DIAGNOSIS — N2581 Secondary hyperparathyroidism of renal origin: Secondary | ICD-10-CM | POA: Diagnosis not present

## 2021-04-01 DIAGNOSIS — D631 Anemia in chronic kidney disease: Secondary | ICD-10-CM | POA: Diagnosis not present

## 2021-04-01 DIAGNOSIS — E1129 Type 2 diabetes mellitus with other diabetic kidney complication: Secondary | ICD-10-CM | POA: Diagnosis not present

## 2021-04-01 DIAGNOSIS — Z992 Dependence on renal dialysis: Secondary | ICD-10-CM | POA: Diagnosis not present

## 2021-04-01 DIAGNOSIS — N186 End stage renal disease: Secondary | ICD-10-CM | POA: Diagnosis not present

## 2021-04-04 DIAGNOSIS — N2581 Secondary hyperparathyroidism of renal origin: Secondary | ICD-10-CM | POA: Diagnosis not present

## 2021-04-04 DIAGNOSIS — N186 End stage renal disease: Secondary | ICD-10-CM | POA: Diagnosis not present

## 2021-04-04 DIAGNOSIS — D631 Anemia in chronic kidney disease: Secondary | ICD-10-CM | POA: Diagnosis not present

## 2021-04-04 DIAGNOSIS — E1129 Type 2 diabetes mellitus with other diabetic kidney complication: Secondary | ICD-10-CM | POA: Diagnosis not present

## 2021-04-04 DIAGNOSIS — D509 Iron deficiency anemia, unspecified: Secondary | ICD-10-CM | POA: Diagnosis not present

## 2021-04-04 DIAGNOSIS — Z992 Dependence on renal dialysis: Secondary | ICD-10-CM | POA: Diagnosis not present

## 2021-04-05 DIAGNOSIS — H401131 Primary open-angle glaucoma, bilateral, mild stage: Secondary | ICD-10-CM | POA: Diagnosis not present

## 2021-04-05 DIAGNOSIS — E113293 Type 2 diabetes mellitus with mild nonproliferative diabetic retinopathy without macular edema, bilateral: Secondary | ICD-10-CM | POA: Diagnosis not present

## 2021-04-06 DIAGNOSIS — D631 Anemia in chronic kidney disease: Secondary | ICD-10-CM | POA: Diagnosis not present

## 2021-04-06 DIAGNOSIS — E1129 Type 2 diabetes mellitus with other diabetic kidney complication: Secondary | ICD-10-CM | POA: Diagnosis not present

## 2021-04-06 DIAGNOSIS — Z992 Dependence on renal dialysis: Secondary | ICD-10-CM | POA: Diagnosis not present

## 2021-04-06 DIAGNOSIS — N2581 Secondary hyperparathyroidism of renal origin: Secondary | ICD-10-CM | POA: Diagnosis not present

## 2021-04-06 DIAGNOSIS — N186 End stage renal disease: Secondary | ICD-10-CM | POA: Diagnosis not present

## 2021-04-06 DIAGNOSIS — D509 Iron deficiency anemia, unspecified: Secondary | ICD-10-CM | POA: Diagnosis not present

## 2021-04-07 ENCOUNTER — Ambulatory Visit: Payer: Medicare Other | Admitting: Podiatry

## 2021-04-08 DIAGNOSIS — D631 Anemia in chronic kidney disease: Secondary | ICD-10-CM | POA: Diagnosis not present

## 2021-04-08 DIAGNOSIS — N2581 Secondary hyperparathyroidism of renal origin: Secondary | ICD-10-CM | POA: Diagnosis not present

## 2021-04-08 DIAGNOSIS — N186 End stage renal disease: Secondary | ICD-10-CM | POA: Diagnosis not present

## 2021-04-08 DIAGNOSIS — D509 Iron deficiency anemia, unspecified: Secondary | ICD-10-CM | POA: Diagnosis not present

## 2021-04-08 DIAGNOSIS — Z992 Dependence on renal dialysis: Secondary | ICD-10-CM | POA: Diagnosis not present

## 2021-04-08 DIAGNOSIS — E1129 Type 2 diabetes mellitus with other diabetic kidney complication: Secondary | ICD-10-CM | POA: Diagnosis not present

## 2021-04-11 DIAGNOSIS — Z992 Dependence on renal dialysis: Secondary | ICD-10-CM | POA: Diagnosis not present

## 2021-04-11 DIAGNOSIS — D509 Iron deficiency anemia, unspecified: Secondary | ICD-10-CM | POA: Diagnosis not present

## 2021-04-11 DIAGNOSIS — E1129 Type 2 diabetes mellitus with other diabetic kidney complication: Secondary | ICD-10-CM | POA: Diagnosis not present

## 2021-04-11 DIAGNOSIS — D631 Anemia in chronic kidney disease: Secondary | ICD-10-CM | POA: Diagnosis not present

## 2021-04-11 DIAGNOSIS — N186 End stage renal disease: Secondary | ICD-10-CM | POA: Diagnosis not present

## 2021-04-11 DIAGNOSIS — N2581 Secondary hyperparathyroidism of renal origin: Secondary | ICD-10-CM | POA: Diagnosis not present

## 2021-04-13 DIAGNOSIS — E1129 Type 2 diabetes mellitus with other diabetic kidney complication: Secondary | ICD-10-CM | POA: Diagnosis not present

## 2021-04-13 DIAGNOSIS — Z992 Dependence on renal dialysis: Secondary | ICD-10-CM | POA: Diagnosis not present

## 2021-04-13 DIAGNOSIS — N186 End stage renal disease: Secondary | ICD-10-CM | POA: Diagnosis not present

## 2021-04-13 DIAGNOSIS — N2581 Secondary hyperparathyroidism of renal origin: Secondary | ICD-10-CM | POA: Diagnosis not present

## 2021-04-13 DIAGNOSIS — D509 Iron deficiency anemia, unspecified: Secondary | ICD-10-CM | POA: Diagnosis not present

## 2021-04-13 DIAGNOSIS — D631 Anemia in chronic kidney disease: Secondary | ICD-10-CM | POA: Diagnosis not present

## 2021-04-15 DIAGNOSIS — D509 Iron deficiency anemia, unspecified: Secondary | ICD-10-CM | POA: Diagnosis not present

## 2021-04-15 DIAGNOSIS — Z992 Dependence on renal dialysis: Secondary | ICD-10-CM | POA: Diagnosis not present

## 2021-04-15 DIAGNOSIS — N186 End stage renal disease: Secondary | ICD-10-CM | POA: Diagnosis not present

## 2021-04-15 DIAGNOSIS — E1129 Type 2 diabetes mellitus with other diabetic kidney complication: Secondary | ICD-10-CM | POA: Diagnosis not present

## 2021-04-15 DIAGNOSIS — D631 Anemia in chronic kidney disease: Secondary | ICD-10-CM | POA: Diagnosis not present

## 2021-04-15 DIAGNOSIS — N2581 Secondary hyperparathyroidism of renal origin: Secondary | ICD-10-CM | POA: Diagnosis not present

## 2021-04-18 DIAGNOSIS — D509 Iron deficiency anemia, unspecified: Secondary | ICD-10-CM | POA: Diagnosis not present

## 2021-04-18 DIAGNOSIS — N2581 Secondary hyperparathyroidism of renal origin: Secondary | ICD-10-CM | POA: Diagnosis not present

## 2021-04-18 DIAGNOSIS — E1129 Type 2 diabetes mellitus with other diabetic kidney complication: Secondary | ICD-10-CM | POA: Diagnosis not present

## 2021-04-18 DIAGNOSIS — D631 Anemia in chronic kidney disease: Secondary | ICD-10-CM | POA: Diagnosis not present

## 2021-04-18 DIAGNOSIS — Z992 Dependence on renal dialysis: Secondary | ICD-10-CM | POA: Diagnosis not present

## 2021-04-18 DIAGNOSIS — N186 End stage renal disease: Secondary | ICD-10-CM | POA: Diagnosis not present

## 2021-04-20 DIAGNOSIS — N186 End stage renal disease: Secondary | ICD-10-CM | POA: Diagnosis not present

## 2021-04-20 DIAGNOSIS — D509 Iron deficiency anemia, unspecified: Secondary | ICD-10-CM | POA: Diagnosis not present

## 2021-04-20 DIAGNOSIS — E1129 Type 2 diabetes mellitus with other diabetic kidney complication: Secondary | ICD-10-CM | POA: Diagnosis not present

## 2021-04-20 DIAGNOSIS — N2581 Secondary hyperparathyroidism of renal origin: Secondary | ICD-10-CM | POA: Diagnosis not present

## 2021-04-20 DIAGNOSIS — D631 Anemia in chronic kidney disease: Secondary | ICD-10-CM | POA: Diagnosis not present

## 2021-04-20 DIAGNOSIS — Z992 Dependence on renal dialysis: Secondary | ICD-10-CM | POA: Diagnosis not present

## 2021-04-22 DIAGNOSIS — D509 Iron deficiency anemia, unspecified: Secondary | ICD-10-CM | POA: Diagnosis not present

## 2021-04-22 DIAGNOSIS — Z992 Dependence on renal dialysis: Secondary | ICD-10-CM | POA: Diagnosis not present

## 2021-04-22 DIAGNOSIS — E1129 Type 2 diabetes mellitus with other diabetic kidney complication: Secondary | ICD-10-CM | POA: Diagnosis not present

## 2021-04-22 DIAGNOSIS — N186 End stage renal disease: Secondary | ICD-10-CM | POA: Diagnosis not present

## 2021-04-22 DIAGNOSIS — D631 Anemia in chronic kidney disease: Secondary | ICD-10-CM | POA: Diagnosis not present

## 2021-04-22 DIAGNOSIS — N2581 Secondary hyperparathyroidism of renal origin: Secondary | ICD-10-CM | POA: Diagnosis not present

## 2021-04-23 DIAGNOSIS — N186 End stage renal disease: Secondary | ICD-10-CM | POA: Diagnosis not present

## 2021-04-23 DIAGNOSIS — Z992 Dependence on renal dialysis: Secondary | ICD-10-CM | POA: Diagnosis not present

## 2021-04-23 DIAGNOSIS — E1122 Type 2 diabetes mellitus with diabetic chronic kidney disease: Secondary | ICD-10-CM | POA: Diagnosis not present

## 2021-04-25 DIAGNOSIS — N186 End stage renal disease: Secondary | ICD-10-CM | POA: Diagnosis not present

## 2021-04-25 DIAGNOSIS — D509 Iron deficiency anemia, unspecified: Secondary | ICD-10-CM | POA: Diagnosis not present

## 2021-04-25 DIAGNOSIS — Z992 Dependence on renal dialysis: Secondary | ICD-10-CM | POA: Diagnosis not present

## 2021-04-25 DIAGNOSIS — D631 Anemia in chronic kidney disease: Secondary | ICD-10-CM | POA: Diagnosis not present

## 2021-04-25 DIAGNOSIS — N2581 Secondary hyperparathyroidism of renal origin: Secondary | ICD-10-CM | POA: Diagnosis not present

## 2021-04-25 DIAGNOSIS — E1129 Type 2 diabetes mellitus with other diabetic kidney complication: Secondary | ICD-10-CM | POA: Diagnosis not present

## 2021-04-27 DIAGNOSIS — D509 Iron deficiency anemia, unspecified: Secondary | ICD-10-CM | POA: Diagnosis not present

## 2021-04-27 DIAGNOSIS — E1129 Type 2 diabetes mellitus with other diabetic kidney complication: Secondary | ICD-10-CM | POA: Diagnosis not present

## 2021-04-27 DIAGNOSIS — N186 End stage renal disease: Secondary | ICD-10-CM | POA: Diagnosis not present

## 2021-04-27 DIAGNOSIS — D631 Anemia in chronic kidney disease: Secondary | ICD-10-CM | POA: Diagnosis not present

## 2021-04-27 DIAGNOSIS — N2581 Secondary hyperparathyroidism of renal origin: Secondary | ICD-10-CM | POA: Diagnosis not present

## 2021-04-27 DIAGNOSIS — Z992 Dependence on renal dialysis: Secondary | ICD-10-CM | POA: Diagnosis not present

## 2021-04-28 DIAGNOSIS — R809 Proteinuria, unspecified: Secondary | ICD-10-CM | POA: Diagnosis not present

## 2021-04-28 DIAGNOSIS — K219 Gastro-esophageal reflux disease without esophagitis: Secondary | ICD-10-CM | POA: Diagnosis not present

## 2021-04-28 DIAGNOSIS — J208 Acute bronchitis due to other specified organisms: Secondary | ICD-10-CM | POA: Diagnosis not present

## 2021-04-28 DIAGNOSIS — N186 End stage renal disease: Secondary | ICD-10-CM | POA: Diagnosis not present

## 2021-04-28 DIAGNOSIS — B9689 Other specified bacterial agents as the cause of diseases classified elsewhere: Secondary | ICD-10-CM | POA: Diagnosis not present

## 2021-04-28 DIAGNOSIS — Z992 Dependence on renal dialysis: Secondary | ICD-10-CM | POA: Diagnosis not present

## 2021-04-28 DIAGNOSIS — M545 Low back pain, unspecified: Secondary | ICD-10-CM | POA: Diagnosis not present

## 2021-04-28 DIAGNOSIS — E1129 Type 2 diabetes mellitus with other diabetic kidney complication: Secondary | ICD-10-CM | POA: Diagnosis not present

## 2021-04-28 DIAGNOSIS — G8929 Other chronic pain: Secondary | ICD-10-CM | POA: Diagnosis not present

## 2021-04-28 DIAGNOSIS — E781 Pure hyperglyceridemia: Secondary | ICD-10-CM | POA: Diagnosis not present

## 2021-04-29 DIAGNOSIS — N2581 Secondary hyperparathyroidism of renal origin: Secondary | ICD-10-CM | POA: Diagnosis not present

## 2021-04-29 DIAGNOSIS — E1129 Type 2 diabetes mellitus with other diabetic kidney complication: Secondary | ICD-10-CM | POA: Diagnosis not present

## 2021-04-29 DIAGNOSIS — D631 Anemia in chronic kidney disease: Secondary | ICD-10-CM | POA: Diagnosis not present

## 2021-04-29 DIAGNOSIS — Z992 Dependence on renal dialysis: Secondary | ICD-10-CM | POA: Diagnosis not present

## 2021-04-29 DIAGNOSIS — D509 Iron deficiency anemia, unspecified: Secondary | ICD-10-CM | POA: Diagnosis not present

## 2021-04-29 DIAGNOSIS — N186 End stage renal disease: Secondary | ICD-10-CM | POA: Diagnosis not present

## 2021-05-02 DIAGNOSIS — Z992 Dependence on renal dialysis: Secondary | ICD-10-CM | POA: Diagnosis not present

## 2021-05-02 DIAGNOSIS — N2581 Secondary hyperparathyroidism of renal origin: Secondary | ICD-10-CM | POA: Diagnosis not present

## 2021-05-02 DIAGNOSIS — D509 Iron deficiency anemia, unspecified: Secondary | ICD-10-CM | POA: Diagnosis not present

## 2021-05-02 DIAGNOSIS — D631 Anemia in chronic kidney disease: Secondary | ICD-10-CM | POA: Diagnosis not present

## 2021-05-02 DIAGNOSIS — E1129 Type 2 diabetes mellitus with other diabetic kidney complication: Secondary | ICD-10-CM | POA: Diagnosis not present

## 2021-05-02 DIAGNOSIS — N186 End stage renal disease: Secondary | ICD-10-CM | POA: Diagnosis not present

## 2021-05-04 DIAGNOSIS — N2581 Secondary hyperparathyroidism of renal origin: Secondary | ICD-10-CM | POA: Diagnosis not present

## 2021-05-04 DIAGNOSIS — E1129 Type 2 diabetes mellitus with other diabetic kidney complication: Secondary | ICD-10-CM | POA: Diagnosis not present

## 2021-05-04 DIAGNOSIS — Z992 Dependence on renal dialysis: Secondary | ICD-10-CM | POA: Diagnosis not present

## 2021-05-04 DIAGNOSIS — D509 Iron deficiency anemia, unspecified: Secondary | ICD-10-CM | POA: Diagnosis not present

## 2021-05-04 DIAGNOSIS — D631 Anemia in chronic kidney disease: Secondary | ICD-10-CM | POA: Diagnosis not present

## 2021-05-04 DIAGNOSIS — N186 End stage renal disease: Secondary | ICD-10-CM | POA: Diagnosis not present

## 2021-05-06 DIAGNOSIS — D631 Anemia in chronic kidney disease: Secondary | ICD-10-CM | POA: Diagnosis not present

## 2021-05-06 DIAGNOSIS — N2581 Secondary hyperparathyroidism of renal origin: Secondary | ICD-10-CM | POA: Diagnosis not present

## 2021-05-06 DIAGNOSIS — E1129 Type 2 diabetes mellitus with other diabetic kidney complication: Secondary | ICD-10-CM | POA: Diagnosis not present

## 2021-05-06 DIAGNOSIS — Z992 Dependence on renal dialysis: Secondary | ICD-10-CM | POA: Diagnosis not present

## 2021-05-06 DIAGNOSIS — N186 End stage renal disease: Secondary | ICD-10-CM | POA: Diagnosis not present

## 2021-05-06 DIAGNOSIS — D509 Iron deficiency anemia, unspecified: Secondary | ICD-10-CM | POA: Diagnosis not present

## 2021-05-09 DIAGNOSIS — D509 Iron deficiency anemia, unspecified: Secondary | ICD-10-CM | POA: Diagnosis not present

## 2021-05-09 DIAGNOSIS — E785 Hyperlipidemia, unspecified: Secondary | ICD-10-CM | POA: Diagnosis not present

## 2021-05-09 DIAGNOSIS — M25551 Pain in right hip: Secondary | ICD-10-CM | POA: Diagnosis not present

## 2021-05-09 DIAGNOSIS — E11319 Type 2 diabetes mellitus with unspecified diabetic retinopathy without macular edema: Secondary | ICD-10-CM | POA: Diagnosis not present

## 2021-05-09 DIAGNOSIS — E781 Pure hyperglyceridemia: Secondary | ICD-10-CM | POA: Diagnosis not present

## 2021-05-09 DIAGNOSIS — D631 Anemia in chronic kidney disease: Secondary | ICD-10-CM | POA: Diagnosis not present

## 2021-05-09 DIAGNOSIS — K219 Gastro-esophageal reflux disease without esophagitis: Secondary | ICD-10-CM | POA: Diagnosis not present

## 2021-05-09 DIAGNOSIS — R809 Proteinuria, unspecified: Secondary | ICD-10-CM | POA: Diagnosis not present

## 2021-05-09 DIAGNOSIS — N2581 Secondary hyperparathyroidism of renal origin: Secondary | ICD-10-CM | POA: Diagnosis not present

## 2021-05-09 DIAGNOSIS — I10 Essential (primary) hypertension: Secondary | ICD-10-CM | POA: Diagnosis not present

## 2021-05-09 DIAGNOSIS — N186 End stage renal disease: Secondary | ICD-10-CM | POA: Diagnosis not present

## 2021-05-09 DIAGNOSIS — E1129 Type 2 diabetes mellitus with other diabetic kidney complication: Secondary | ICD-10-CM | POA: Diagnosis not present

## 2021-05-09 DIAGNOSIS — G4700 Insomnia, unspecified: Secondary | ICD-10-CM | POA: Diagnosis not present

## 2021-05-09 DIAGNOSIS — Z992 Dependence on renal dialysis: Secondary | ICD-10-CM | POA: Diagnosis not present

## 2021-05-11 DIAGNOSIS — D509 Iron deficiency anemia, unspecified: Secondary | ICD-10-CM | POA: Diagnosis not present

## 2021-05-11 DIAGNOSIS — E1129 Type 2 diabetes mellitus with other diabetic kidney complication: Secondary | ICD-10-CM | POA: Diagnosis not present

## 2021-05-11 DIAGNOSIS — N2581 Secondary hyperparathyroidism of renal origin: Secondary | ICD-10-CM | POA: Diagnosis not present

## 2021-05-11 DIAGNOSIS — N186 End stage renal disease: Secondary | ICD-10-CM | POA: Diagnosis not present

## 2021-05-11 DIAGNOSIS — Z992 Dependence on renal dialysis: Secondary | ICD-10-CM | POA: Diagnosis not present

## 2021-05-11 DIAGNOSIS — D631 Anemia in chronic kidney disease: Secondary | ICD-10-CM | POA: Diagnosis not present

## 2021-05-13 DIAGNOSIS — D631 Anemia in chronic kidney disease: Secondary | ICD-10-CM | POA: Diagnosis not present

## 2021-05-13 DIAGNOSIS — Z992 Dependence on renal dialysis: Secondary | ICD-10-CM | POA: Diagnosis not present

## 2021-05-13 DIAGNOSIS — D509 Iron deficiency anemia, unspecified: Secondary | ICD-10-CM | POA: Diagnosis not present

## 2021-05-13 DIAGNOSIS — N186 End stage renal disease: Secondary | ICD-10-CM | POA: Diagnosis not present

## 2021-05-13 DIAGNOSIS — E1129 Type 2 diabetes mellitus with other diabetic kidney complication: Secondary | ICD-10-CM | POA: Diagnosis not present

## 2021-05-13 DIAGNOSIS — N2581 Secondary hyperparathyroidism of renal origin: Secondary | ICD-10-CM | POA: Diagnosis not present

## 2021-05-16 DIAGNOSIS — E1129 Type 2 diabetes mellitus with other diabetic kidney complication: Secondary | ICD-10-CM | POA: Diagnosis not present

## 2021-05-16 DIAGNOSIS — E11319 Type 2 diabetes mellitus with unspecified diabetic retinopathy without macular edema: Secondary | ICD-10-CM | POA: Diagnosis not present

## 2021-05-16 DIAGNOSIS — Z992 Dependence on renal dialysis: Secondary | ICD-10-CM | POA: Diagnosis not present

## 2021-05-16 DIAGNOSIS — D509 Iron deficiency anemia, unspecified: Secondary | ICD-10-CM | POA: Diagnosis not present

## 2021-05-16 DIAGNOSIS — D631 Anemia in chronic kidney disease: Secondary | ICD-10-CM | POA: Diagnosis not present

## 2021-05-16 DIAGNOSIS — N2581 Secondary hyperparathyroidism of renal origin: Secondary | ICD-10-CM | POA: Diagnosis not present

## 2021-05-16 DIAGNOSIS — K219 Gastro-esophageal reflux disease without esophagitis: Secondary | ICD-10-CM | POA: Diagnosis not present

## 2021-05-16 DIAGNOSIS — E785 Hyperlipidemia, unspecified: Secondary | ICD-10-CM | POA: Diagnosis not present

## 2021-05-16 DIAGNOSIS — I10 Essential (primary) hypertension: Secondary | ICD-10-CM | POA: Diagnosis not present

## 2021-05-16 DIAGNOSIS — N186 End stage renal disease: Secondary | ICD-10-CM | POA: Diagnosis not present

## 2021-05-16 DIAGNOSIS — M25551 Pain in right hip: Secondary | ICD-10-CM | POA: Diagnosis not present

## 2021-05-16 DIAGNOSIS — R809 Proteinuria, unspecified: Secondary | ICD-10-CM | POA: Diagnosis not present

## 2021-05-16 DIAGNOSIS — G4700 Insomnia, unspecified: Secondary | ICD-10-CM | POA: Diagnosis not present

## 2021-05-16 DIAGNOSIS — E781 Pure hyperglyceridemia: Secondary | ICD-10-CM | POA: Diagnosis not present

## 2021-05-18 DIAGNOSIS — E1129 Type 2 diabetes mellitus with other diabetic kidney complication: Secondary | ICD-10-CM | POA: Diagnosis not present

## 2021-05-18 DIAGNOSIS — Z992 Dependence on renal dialysis: Secondary | ICD-10-CM | POA: Diagnosis not present

## 2021-05-18 DIAGNOSIS — D509 Iron deficiency anemia, unspecified: Secondary | ICD-10-CM | POA: Diagnosis not present

## 2021-05-18 DIAGNOSIS — D631 Anemia in chronic kidney disease: Secondary | ICD-10-CM | POA: Diagnosis not present

## 2021-05-18 DIAGNOSIS — N2581 Secondary hyperparathyroidism of renal origin: Secondary | ICD-10-CM | POA: Diagnosis not present

## 2021-05-18 DIAGNOSIS — N186 End stage renal disease: Secondary | ICD-10-CM | POA: Diagnosis not present

## 2021-05-20 DIAGNOSIS — N2581 Secondary hyperparathyroidism of renal origin: Secondary | ICD-10-CM | POA: Diagnosis not present

## 2021-05-20 DIAGNOSIS — N186 End stage renal disease: Secondary | ICD-10-CM | POA: Diagnosis not present

## 2021-05-20 DIAGNOSIS — D631 Anemia in chronic kidney disease: Secondary | ICD-10-CM | POA: Diagnosis not present

## 2021-05-20 DIAGNOSIS — E1129 Type 2 diabetes mellitus with other diabetic kidney complication: Secondary | ICD-10-CM | POA: Diagnosis not present

## 2021-05-20 DIAGNOSIS — Z992 Dependence on renal dialysis: Secondary | ICD-10-CM | POA: Diagnosis not present

## 2021-05-20 DIAGNOSIS — D509 Iron deficiency anemia, unspecified: Secondary | ICD-10-CM | POA: Diagnosis not present

## 2021-05-23 DIAGNOSIS — D631 Anemia in chronic kidney disease: Secondary | ICD-10-CM | POA: Diagnosis not present

## 2021-05-23 DIAGNOSIS — N2581 Secondary hyperparathyroidism of renal origin: Secondary | ICD-10-CM | POA: Diagnosis not present

## 2021-05-23 DIAGNOSIS — D509 Iron deficiency anemia, unspecified: Secondary | ICD-10-CM | POA: Diagnosis not present

## 2021-05-23 DIAGNOSIS — Z992 Dependence on renal dialysis: Secondary | ICD-10-CM | POA: Diagnosis not present

## 2021-05-23 DIAGNOSIS — E1129 Type 2 diabetes mellitus with other diabetic kidney complication: Secondary | ICD-10-CM | POA: Diagnosis not present

## 2021-05-23 DIAGNOSIS — N186 End stage renal disease: Secondary | ICD-10-CM | POA: Diagnosis not present

## 2021-05-24 DIAGNOSIS — N186 End stage renal disease: Secondary | ICD-10-CM | POA: Diagnosis not present

## 2021-05-24 DIAGNOSIS — Z992 Dependence on renal dialysis: Secondary | ICD-10-CM | POA: Diagnosis not present

## 2021-05-24 DIAGNOSIS — E1122 Type 2 diabetes mellitus with diabetic chronic kidney disease: Secondary | ICD-10-CM | POA: Diagnosis not present

## 2021-05-25 DIAGNOSIS — D631 Anemia in chronic kidney disease: Secondary | ICD-10-CM | POA: Diagnosis not present

## 2021-05-25 DIAGNOSIS — Z992 Dependence on renal dialysis: Secondary | ICD-10-CM | POA: Diagnosis not present

## 2021-05-25 DIAGNOSIS — N186 End stage renal disease: Secondary | ICD-10-CM | POA: Diagnosis not present

## 2021-05-25 DIAGNOSIS — N2581 Secondary hyperparathyroidism of renal origin: Secondary | ICD-10-CM | POA: Diagnosis not present

## 2021-05-27 DIAGNOSIS — D631 Anemia in chronic kidney disease: Secondary | ICD-10-CM | POA: Diagnosis not present

## 2021-05-27 DIAGNOSIS — N186 End stage renal disease: Secondary | ICD-10-CM | POA: Diagnosis not present

## 2021-05-27 DIAGNOSIS — N2581 Secondary hyperparathyroidism of renal origin: Secondary | ICD-10-CM | POA: Diagnosis not present

## 2021-05-27 DIAGNOSIS — Z992 Dependence on renal dialysis: Secondary | ICD-10-CM | POA: Diagnosis not present

## 2021-05-30 ENCOUNTER — Ambulatory Visit (INDEPENDENT_AMBULATORY_CARE_PROVIDER_SITE_OTHER): Payer: Medicare Other

## 2021-05-30 DIAGNOSIS — I472 Ventricular tachycardia, unspecified: Secondary | ICD-10-CM | POA: Diagnosis not present

## 2021-05-30 DIAGNOSIS — N186 End stage renal disease: Secondary | ICD-10-CM | POA: Diagnosis not present

## 2021-05-30 DIAGNOSIS — Z992 Dependence on renal dialysis: Secondary | ICD-10-CM | POA: Diagnosis not present

## 2021-05-30 DIAGNOSIS — D631 Anemia in chronic kidney disease: Secondary | ICD-10-CM | POA: Diagnosis not present

## 2021-05-30 DIAGNOSIS — N2581 Secondary hyperparathyroidism of renal origin: Secondary | ICD-10-CM | POA: Diagnosis not present

## 2021-05-31 LAB — CUP PACEART REMOTE DEVICE CHECK
Battery Remaining Percentage: 83 %
Date Time Interrogation Session: 20230206111300
Implantable Lead Implant Date: 20210923
Implantable Lead Location: 753862
Implantable Lead Model: 3501
Implantable Lead Serial Number: 194255
Implantable Pulse Generator Implant Date: 20210923
Pulse Gen Serial Number: 140488

## 2021-06-01 DIAGNOSIS — D631 Anemia in chronic kidney disease: Secondary | ICD-10-CM | POA: Diagnosis not present

## 2021-06-01 DIAGNOSIS — N2581 Secondary hyperparathyroidism of renal origin: Secondary | ICD-10-CM | POA: Diagnosis not present

## 2021-06-01 DIAGNOSIS — Z992 Dependence on renal dialysis: Secondary | ICD-10-CM | POA: Diagnosis not present

## 2021-06-01 DIAGNOSIS — N186 End stage renal disease: Secondary | ICD-10-CM | POA: Diagnosis not present

## 2021-06-02 DIAGNOSIS — M545 Low back pain, unspecified: Secondary | ICD-10-CM | POA: Diagnosis not present

## 2021-06-02 DIAGNOSIS — E11319 Type 2 diabetes mellitus with unspecified diabetic retinopathy without macular edema: Secondary | ICD-10-CM | POA: Diagnosis not present

## 2021-06-02 DIAGNOSIS — G8929 Other chronic pain: Secondary | ICD-10-CM | POA: Diagnosis not present

## 2021-06-02 DIAGNOSIS — E785 Hyperlipidemia, unspecified: Secondary | ICD-10-CM | POA: Diagnosis not present

## 2021-06-02 DIAGNOSIS — G4700 Insomnia, unspecified: Secondary | ICD-10-CM | POA: Diagnosis not present

## 2021-06-02 DIAGNOSIS — K219 Gastro-esophageal reflux disease without esophagitis: Secondary | ICD-10-CM | POA: Diagnosis not present

## 2021-06-02 DIAGNOSIS — E781 Pure hyperglyceridemia: Secondary | ICD-10-CM | POA: Diagnosis not present

## 2021-06-02 DIAGNOSIS — E1129 Type 2 diabetes mellitus with other diabetic kidney complication: Secondary | ICD-10-CM | POA: Diagnosis not present

## 2021-06-02 DIAGNOSIS — I10 Essential (primary) hypertension: Secondary | ICD-10-CM | POA: Diagnosis not present

## 2021-06-02 DIAGNOSIS — R809 Proteinuria, unspecified: Secondary | ICD-10-CM | POA: Diagnosis not present

## 2021-06-02 NOTE — Progress Notes (Signed)
Remote ICD transmission.   

## 2021-06-03 DIAGNOSIS — D631 Anemia in chronic kidney disease: Secondary | ICD-10-CM | POA: Diagnosis not present

## 2021-06-03 DIAGNOSIS — N2581 Secondary hyperparathyroidism of renal origin: Secondary | ICD-10-CM | POA: Diagnosis not present

## 2021-06-03 DIAGNOSIS — Z992 Dependence on renal dialysis: Secondary | ICD-10-CM | POA: Diagnosis not present

## 2021-06-03 DIAGNOSIS — N186 End stage renal disease: Secondary | ICD-10-CM | POA: Diagnosis not present

## 2021-06-06 DIAGNOSIS — N186 End stage renal disease: Secondary | ICD-10-CM | POA: Diagnosis not present

## 2021-06-06 DIAGNOSIS — D631 Anemia in chronic kidney disease: Secondary | ICD-10-CM | POA: Diagnosis not present

## 2021-06-06 DIAGNOSIS — N2581 Secondary hyperparathyroidism of renal origin: Secondary | ICD-10-CM | POA: Diagnosis not present

## 2021-06-06 DIAGNOSIS — Z992 Dependence on renal dialysis: Secondary | ICD-10-CM | POA: Diagnosis not present

## 2021-06-08 DIAGNOSIS — D631 Anemia in chronic kidney disease: Secondary | ICD-10-CM | POA: Diagnosis not present

## 2021-06-08 DIAGNOSIS — Z992 Dependence on renal dialysis: Secondary | ICD-10-CM | POA: Diagnosis not present

## 2021-06-08 DIAGNOSIS — N2581 Secondary hyperparathyroidism of renal origin: Secondary | ICD-10-CM | POA: Diagnosis not present

## 2021-06-08 DIAGNOSIS — N186 End stage renal disease: Secondary | ICD-10-CM | POA: Diagnosis not present

## 2021-06-10 DIAGNOSIS — Z992 Dependence on renal dialysis: Secondary | ICD-10-CM | POA: Diagnosis not present

## 2021-06-10 DIAGNOSIS — N186 End stage renal disease: Secondary | ICD-10-CM | POA: Diagnosis not present

## 2021-06-10 DIAGNOSIS — D631 Anemia in chronic kidney disease: Secondary | ICD-10-CM | POA: Diagnosis not present

## 2021-06-10 DIAGNOSIS — N2581 Secondary hyperparathyroidism of renal origin: Secondary | ICD-10-CM | POA: Diagnosis not present

## 2021-06-13 DIAGNOSIS — N186 End stage renal disease: Secondary | ICD-10-CM | POA: Diagnosis not present

## 2021-06-13 DIAGNOSIS — Z992 Dependence on renal dialysis: Secondary | ICD-10-CM | POA: Diagnosis not present

## 2021-06-13 DIAGNOSIS — N2581 Secondary hyperparathyroidism of renal origin: Secondary | ICD-10-CM | POA: Diagnosis not present

## 2021-06-13 DIAGNOSIS — D631 Anemia in chronic kidney disease: Secondary | ICD-10-CM | POA: Diagnosis not present

## 2021-06-15 DIAGNOSIS — Z992 Dependence on renal dialysis: Secondary | ICD-10-CM | POA: Diagnosis not present

## 2021-06-15 DIAGNOSIS — N2581 Secondary hyperparathyroidism of renal origin: Secondary | ICD-10-CM | POA: Diagnosis not present

## 2021-06-15 DIAGNOSIS — N186 End stage renal disease: Secondary | ICD-10-CM | POA: Diagnosis not present

## 2021-06-15 DIAGNOSIS — D631 Anemia in chronic kidney disease: Secondary | ICD-10-CM | POA: Diagnosis not present

## 2021-06-16 DIAGNOSIS — M25551 Pain in right hip: Secondary | ICD-10-CM | POA: Diagnosis not present

## 2021-06-16 DIAGNOSIS — E781 Pure hyperglyceridemia: Secondary | ICD-10-CM | POA: Diagnosis not present

## 2021-06-16 DIAGNOSIS — E11319 Type 2 diabetes mellitus with unspecified diabetic retinopathy without macular edema: Secondary | ICD-10-CM | POA: Diagnosis not present

## 2021-06-16 DIAGNOSIS — N186 End stage renal disease: Secondary | ICD-10-CM | POA: Diagnosis not present

## 2021-06-16 DIAGNOSIS — E785 Hyperlipidemia, unspecified: Secondary | ICD-10-CM | POA: Diagnosis not present

## 2021-06-16 DIAGNOSIS — I10 Essential (primary) hypertension: Secondary | ICD-10-CM | POA: Diagnosis not present

## 2021-06-16 DIAGNOSIS — R809 Proteinuria, unspecified: Secondary | ICD-10-CM | POA: Diagnosis not present

## 2021-06-16 DIAGNOSIS — E1129 Type 2 diabetes mellitus with other diabetic kidney complication: Secondary | ICD-10-CM | POA: Diagnosis not present

## 2021-06-16 DIAGNOSIS — Z992 Dependence on renal dialysis: Secondary | ICD-10-CM | POA: Diagnosis not present

## 2021-06-16 DIAGNOSIS — K219 Gastro-esophageal reflux disease without esophagitis: Secondary | ICD-10-CM | POA: Diagnosis not present

## 2021-06-17 DIAGNOSIS — D631 Anemia in chronic kidney disease: Secondary | ICD-10-CM | POA: Diagnosis not present

## 2021-06-17 DIAGNOSIS — N2581 Secondary hyperparathyroidism of renal origin: Secondary | ICD-10-CM | POA: Diagnosis not present

## 2021-06-17 DIAGNOSIS — N186 End stage renal disease: Secondary | ICD-10-CM | POA: Diagnosis not present

## 2021-06-17 DIAGNOSIS — Z992 Dependence on renal dialysis: Secondary | ICD-10-CM | POA: Diagnosis not present

## 2021-06-20 DIAGNOSIS — D631 Anemia in chronic kidney disease: Secondary | ICD-10-CM | POA: Diagnosis not present

## 2021-06-20 DIAGNOSIS — N186 End stage renal disease: Secondary | ICD-10-CM | POA: Diagnosis not present

## 2021-06-20 DIAGNOSIS — Z992 Dependence on renal dialysis: Secondary | ICD-10-CM | POA: Diagnosis not present

## 2021-06-20 DIAGNOSIS — N2581 Secondary hyperparathyroidism of renal origin: Secondary | ICD-10-CM | POA: Diagnosis not present

## 2021-06-21 DIAGNOSIS — Z992 Dependence on renal dialysis: Secondary | ICD-10-CM | POA: Diagnosis not present

## 2021-06-21 DIAGNOSIS — N186 End stage renal disease: Secondary | ICD-10-CM | POA: Diagnosis not present

## 2021-06-21 DIAGNOSIS — E1122 Type 2 diabetes mellitus with diabetic chronic kidney disease: Secondary | ICD-10-CM | POA: Diagnosis not present

## 2021-06-22 DIAGNOSIS — Z992 Dependence on renal dialysis: Secondary | ICD-10-CM | POA: Diagnosis not present

## 2021-06-22 DIAGNOSIS — N186 End stage renal disease: Secondary | ICD-10-CM | POA: Diagnosis not present

## 2021-06-22 DIAGNOSIS — N2581 Secondary hyperparathyroidism of renal origin: Secondary | ICD-10-CM | POA: Diagnosis not present

## 2021-06-22 DIAGNOSIS — D631 Anemia in chronic kidney disease: Secondary | ICD-10-CM | POA: Diagnosis not present

## 2021-06-23 ENCOUNTER — Ambulatory Visit: Payer: Medicare Other | Admitting: Podiatry

## 2021-06-24 DIAGNOSIS — Z992 Dependence on renal dialysis: Secondary | ICD-10-CM | POA: Diagnosis not present

## 2021-06-24 DIAGNOSIS — N186 End stage renal disease: Secondary | ICD-10-CM | POA: Diagnosis not present

## 2021-06-24 DIAGNOSIS — N2581 Secondary hyperparathyroidism of renal origin: Secondary | ICD-10-CM | POA: Diagnosis not present

## 2021-06-24 DIAGNOSIS — D631 Anemia in chronic kidney disease: Secondary | ICD-10-CM | POA: Diagnosis not present

## 2021-06-27 DIAGNOSIS — D631 Anemia in chronic kidney disease: Secondary | ICD-10-CM | POA: Diagnosis not present

## 2021-06-27 DIAGNOSIS — N186 End stage renal disease: Secondary | ICD-10-CM | POA: Diagnosis not present

## 2021-06-27 DIAGNOSIS — N2581 Secondary hyperparathyroidism of renal origin: Secondary | ICD-10-CM | POA: Diagnosis not present

## 2021-06-27 DIAGNOSIS — Z992 Dependence on renal dialysis: Secondary | ICD-10-CM | POA: Diagnosis not present

## 2021-06-29 DIAGNOSIS — N2581 Secondary hyperparathyroidism of renal origin: Secondary | ICD-10-CM | POA: Diagnosis not present

## 2021-06-29 DIAGNOSIS — Z992 Dependence on renal dialysis: Secondary | ICD-10-CM | POA: Diagnosis not present

## 2021-06-29 DIAGNOSIS — D631 Anemia in chronic kidney disease: Secondary | ICD-10-CM | POA: Diagnosis not present

## 2021-06-29 DIAGNOSIS — N186 End stage renal disease: Secondary | ICD-10-CM | POA: Diagnosis not present

## 2021-06-30 ENCOUNTER — Ambulatory Visit: Payer: Medicare Other | Admitting: Podiatry

## 2021-06-30 DIAGNOSIS — G4700 Insomnia, unspecified: Secondary | ICD-10-CM | POA: Diagnosis not present

## 2021-06-30 DIAGNOSIS — E1129 Type 2 diabetes mellitus with other diabetic kidney complication: Secondary | ICD-10-CM | POA: Diagnosis not present

## 2021-06-30 DIAGNOSIS — E785 Hyperlipidemia, unspecified: Secondary | ICD-10-CM | POA: Diagnosis not present

## 2021-06-30 DIAGNOSIS — E781 Pure hyperglyceridemia: Secondary | ICD-10-CM | POA: Diagnosis not present

## 2021-06-30 DIAGNOSIS — G8929 Other chronic pain: Secondary | ICD-10-CM | POA: Diagnosis not present

## 2021-06-30 DIAGNOSIS — E11319 Type 2 diabetes mellitus with unspecified diabetic retinopathy without macular edema: Secondary | ICD-10-CM | POA: Diagnosis not present

## 2021-06-30 DIAGNOSIS — M545 Low back pain, unspecified: Secondary | ICD-10-CM | POA: Diagnosis not present

## 2021-06-30 DIAGNOSIS — K219 Gastro-esophageal reflux disease without esophagitis: Secondary | ICD-10-CM | POA: Diagnosis not present

## 2021-06-30 DIAGNOSIS — R809 Proteinuria, unspecified: Secondary | ICD-10-CM | POA: Diagnosis not present

## 2021-06-30 DIAGNOSIS — I10 Essential (primary) hypertension: Secondary | ICD-10-CM | POA: Diagnosis not present

## 2021-07-01 DIAGNOSIS — N2581 Secondary hyperparathyroidism of renal origin: Secondary | ICD-10-CM | POA: Diagnosis not present

## 2021-07-01 DIAGNOSIS — N186 End stage renal disease: Secondary | ICD-10-CM | POA: Diagnosis not present

## 2021-07-01 DIAGNOSIS — Z992 Dependence on renal dialysis: Secondary | ICD-10-CM | POA: Diagnosis not present

## 2021-07-01 DIAGNOSIS — D631 Anemia in chronic kidney disease: Secondary | ICD-10-CM | POA: Diagnosis not present

## 2021-07-04 DIAGNOSIS — Z992 Dependence on renal dialysis: Secondary | ICD-10-CM | POA: Diagnosis not present

## 2021-07-04 DIAGNOSIS — D631 Anemia in chronic kidney disease: Secondary | ICD-10-CM | POA: Diagnosis not present

## 2021-07-04 DIAGNOSIS — N186 End stage renal disease: Secondary | ICD-10-CM | POA: Diagnosis not present

## 2021-07-04 DIAGNOSIS — N2581 Secondary hyperparathyroidism of renal origin: Secondary | ICD-10-CM | POA: Diagnosis not present

## 2021-07-06 DIAGNOSIS — Z992 Dependence on renal dialysis: Secondary | ICD-10-CM | POA: Diagnosis not present

## 2021-07-06 DIAGNOSIS — D631 Anemia in chronic kidney disease: Secondary | ICD-10-CM | POA: Diagnosis not present

## 2021-07-06 DIAGNOSIS — N2581 Secondary hyperparathyroidism of renal origin: Secondary | ICD-10-CM | POA: Diagnosis not present

## 2021-07-06 DIAGNOSIS — N186 End stage renal disease: Secondary | ICD-10-CM | POA: Diagnosis not present

## 2021-07-07 ENCOUNTER — Other Ambulatory Visit: Payer: Self-pay

## 2021-07-07 ENCOUNTER — Encounter: Payer: Self-pay | Admitting: Podiatry

## 2021-07-07 ENCOUNTER — Ambulatory Visit: Payer: Medicare Other | Admitting: Podiatry

## 2021-07-07 VITALS — Temp 97.9°F

## 2021-07-07 DIAGNOSIS — L03031 Cellulitis of right toe: Secondary | ICD-10-CM | POA: Diagnosis not present

## 2021-07-07 DIAGNOSIS — B351 Tinea unguium: Secondary | ICD-10-CM | POA: Diagnosis not present

## 2021-07-07 DIAGNOSIS — L97511 Non-pressure chronic ulcer of other part of right foot limited to breakdown of skin: Secondary | ICD-10-CM | POA: Diagnosis not present

## 2021-07-07 DIAGNOSIS — E119 Type 2 diabetes mellitus without complications: Secondary | ICD-10-CM | POA: Diagnosis not present

## 2021-07-07 DIAGNOSIS — L02611 Cutaneous abscess of right foot: Secondary | ICD-10-CM | POA: Diagnosis not present

## 2021-07-07 DIAGNOSIS — E1142 Type 2 diabetes mellitus with diabetic polyneuropathy: Secondary | ICD-10-CM | POA: Diagnosis not present

## 2021-07-07 DIAGNOSIS — M79674 Pain in right toe(s): Secondary | ICD-10-CM | POA: Diagnosis not present

## 2021-07-07 DIAGNOSIS — M79675 Pain in left toe(s): Secondary | ICD-10-CM | POA: Diagnosis not present

## 2021-07-07 DIAGNOSIS — Z992 Dependence on renal dialysis: Secondary | ICD-10-CM | POA: Diagnosis not present

## 2021-07-07 DIAGNOSIS — N186 End stage renal disease: Secondary | ICD-10-CM | POA: Diagnosis not present

## 2021-07-07 MED ORDER — DOXYCYCLINE HYCLATE 100 MG PO CAPS: 100.0000 mg | ORAL_CAPSULE | Freq: Two times a day (BID) | ORAL | 0 refills | Status: AC

## 2021-07-07 NOTE — Patient Instructions (Addendum)
Please call your Vascular Surgeon and get in for evaluation of your right great toe. ? ?DRESSING CHANGES right great toe:  ? ?PHARMACY SHOPPING LIST: ?Saline or Wound Cleanser for cleaning wound ?2 x 2 inch sterile gauze for cleaning wound ?Betadine Solution ? ?A. IF DISPENSED, WEAR SURGICAL SHOE OR WALKING BOOT AT ALL TIMES. ? ?B. IF PRESCRIBED ORAL ANTIBIOTICS, TAKE ALL MEDICATION AS PRESCRIBED UNTIL ALL ARE GONE. ? ?C. IF DOCTOR HAS DESIGNATED NONWEIGHTBEARING STATUS, PLEASE ADHERE TO INSTRUCTIONS. ? ?1. KEEP right foot DRY AT ALL TIMES!!!! ? ?2. CLEANSE ULCER WITH SALINE OR WOUND CLEANSER. ? ?3. DAB DRY WITH GAUZE SPONGE. ? ?4. APPLY A LIGHT AMOUNT OF Betadine Solution TO BASE OF ULCER. ? ?5. APPLY OUTER DRESSING AS INSTRUCTED. ? ?6. WEAR SURGICAL SHOE/BOOT DAILY AT ALL TIMES. IF SUPPLIED, WEAR HEEL PROTECTORS AT ALL TIMES WHEN IN BED. ? ?7. DO NOT WALK BAREFOOT!!! ? ?8.  IF YOU EXPERIENCE ANY FEVER, CHILLS, NIGHTSWEATS, NAUSEA OR VOMITING, ELEVATED OR LOW BLOOD SUGARS, REPORT TO EMERGENCY ROOM. ? ?9. IF YOU EXPERIENCE INCREASED REDNESS, PAIN, SWELLING, DISCOLORATION, ODOR, PUS, DRAINAGE OR WARMTH OF YOUR FOOT, REPORT TO EMERGENCY ROOM.  ?
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
## 2021-07-08 ENCOUNTER — Ambulatory Visit (INDEPENDENT_AMBULATORY_CARE_PROVIDER_SITE_OTHER): Payer: Medicare Other

## 2021-07-08 ENCOUNTER — Encounter: Payer: Self-pay | Admitting: Sports Medicine

## 2021-07-08 ENCOUNTER — Ambulatory Visit: Payer: Medicare Other | Admitting: Sports Medicine

## 2021-07-08 DIAGNOSIS — D631 Anemia in chronic kidney disease: Secondary | ICD-10-CM | POA: Diagnosis not present

## 2021-07-08 DIAGNOSIS — I739 Peripheral vascular disease, unspecified: Secondary | ICD-10-CM | POA: Diagnosis not present

## 2021-07-08 DIAGNOSIS — E1142 Type 2 diabetes mellitus with diabetic polyneuropathy: Secondary | ICD-10-CM | POA: Diagnosis not present

## 2021-07-08 DIAGNOSIS — L03031 Cellulitis of right toe: Secondary | ICD-10-CM

## 2021-07-08 DIAGNOSIS — L97511 Non-pressure chronic ulcer of other part of right foot limited to breakdown of skin: Secondary | ICD-10-CM | POA: Diagnosis not present

## 2021-07-08 DIAGNOSIS — Z992 Dependence on renal dialysis: Secondary | ICD-10-CM | POA: Diagnosis not present

## 2021-07-08 DIAGNOSIS — N2581 Secondary hyperparathyroidism of renal origin: Secondary | ICD-10-CM | POA: Diagnosis not present

## 2021-07-08 DIAGNOSIS — N186 End stage renal disease: Secondary | ICD-10-CM | POA: Diagnosis not present

## 2021-07-08 DIAGNOSIS — L02611 Cutaneous abscess of right foot: Secondary | ICD-10-CM
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
## 2021-07-11 DIAGNOSIS — N186 End stage renal disease: Secondary | ICD-10-CM | POA: Diagnosis not present

## 2021-07-11 DIAGNOSIS — D631 Anemia in chronic kidney disease: Secondary | ICD-10-CM | POA: Diagnosis not present

## 2021-07-11 DIAGNOSIS — N2581 Secondary hyperparathyroidism of renal origin: Secondary | ICD-10-CM | POA: Diagnosis not present

## 2021-07-11 DIAGNOSIS — Z992 Dependence on renal dialysis: Secondary | ICD-10-CM | POA: Diagnosis not present

## 2021-07-13 DIAGNOSIS — D631 Anemia in chronic kidney disease: Secondary | ICD-10-CM | POA: Diagnosis not present

## 2021-07-13 DIAGNOSIS — N186 End stage renal disease: Secondary | ICD-10-CM | POA: Diagnosis not present

## 2021-07-13 DIAGNOSIS — N2581 Secondary hyperparathyroidism of renal origin: Secondary | ICD-10-CM | POA: Diagnosis not present

## 2021-07-13 DIAGNOSIS — Z992 Dependence on renal dialysis: Secondary | ICD-10-CM | POA: Diagnosis not present

## 2021-07-15 ENCOUNTER — Encounter: Payer: Self-pay | Admitting: Sports Medicine

## 2021-07-15 ENCOUNTER — Other Ambulatory Visit: Payer: Self-pay

## 2021-07-15 ENCOUNTER — Ambulatory Visit: Payer: Medicare Other | Admitting: Sports Medicine

## 2021-07-15 DIAGNOSIS — L97511 Non-pressure chronic ulcer of other part of right foot limited to breakdown of skin: Secondary | ICD-10-CM | POA: Diagnosis not present

## 2021-07-15 DIAGNOSIS — Z992 Dependence on renal dialysis: Secondary | ICD-10-CM

## 2021-07-15 DIAGNOSIS — E1142 Type 2 diabetes mellitus with diabetic polyneuropathy: Secondary | ICD-10-CM | POA: Diagnosis not present

## 2021-07-15 DIAGNOSIS — N186 End stage renal disease: Secondary | ICD-10-CM | POA: Diagnosis not present

## 2021-07-15 DIAGNOSIS — L03031 Cellulitis of right toe: Secondary | ICD-10-CM

## 2021-07-15 DIAGNOSIS — L02611 Cutaneous abscess of right foot: Secondary | ICD-10-CM

## 2021-07-15 DIAGNOSIS — I739 Peripheral vascular disease, unspecified: Secondary | ICD-10-CM

## 2021-07-15 DIAGNOSIS — S41111A Laceration without foreign body of right upper arm, initial encounter: Secondary | ICD-10-CM

## 2021-07-15 DIAGNOSIS — N2581 Secondary hyperparathyroidism of renal origin: Secondary | ICD-10-CM | POA: Diagnosis not present

## 2021-07-15 DIAGNOSIS — D631 Anemia in chronic kidney disease: Secondary | ICD-10-CM | POA: Diagnosis not present

## 2021-07-15 MED ORDER — SULFAMETHOXAZOLE-TRIMETHOPRIM 400-80 MG PO TABS: 1.0000 | ORAL_TABLET | Freq: Two times a day (BID) | ORAL | 0 refills | Status: DC
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
## 2021-07-15 NOTE — Patient Instructions (Signed)
[facility] ?Address: [address] [place], [place] [address] ?Phone: [phone] ?CALL THEM FOR YOUR APPT ?

## 2021-07-18 DIAGNOSIS — N2581 Secondary hyperparathyroidism of renal origin: Secondary | ICD-10-CM | POA: Diagnosis not present

## 2021-07-18 DIAGNOSIS — N186 End stage renal disease: Secondary | ICD-10-CM | POA: Diagnosis not present

## 2021-07-18 DIAGNOSIS — D631 Anemia in chronic kidney disease: Secondary | ICD-10-CM | POA: Diagnosis not present

## 2021-07-18 DIAGNOSIS — Z992 Dependence on renal dialysis: Secondary | ICD-10-CM | POA: Diagnosis not present

## 2021-07-20 ENCOUNTER — Telehealth: Payer: Self-pay | Admitting: Sports Medicine

## 2021-07-20 ENCOUNTER — Other Ambulatory Visit: Payer: Self-pay | Admitting: Sports Medicine

## 2021-07-20 ENCOUNTER — Telehealth: Payer: Self-pay | Admitting: *Deleted

## 2021-07-20 DIAGNOSIS — N2581 Secondary hyperparathyroidism of renal origin: Secondary | ICD-10-CM | POA: Diagnosis not present

## 2021-07-20 DIAGNOSIS — N186 End stage renal disease: Secondary | ICD-10-CM | POA: Diagnosis not present

## 2021-07-20 DIAGNOSIS — D631 Anemia in chronic kidney disease: Secondary | ICD-10-CM | POA: Diagnosis not present

## 2021-07-20 DIAGNOSIS — Z992 Dependence on renal dialysis: Secondary | ICD-10-CM | POA: Diagnosis not present

## 2021-07-20 LAB — WOUND CULTURE

## 2021-07-20 MED ORDER — CIPROFLOXACIN HCL 500 MG PO TABS: 500.0000 mg | ORAL_TABLET | Freq: Two times a day (BID) | ORAL | 0 refills | Status: AC

## 2021-07-20 NOTE — Telephone Encounter (Signed)
Called and left a message for the patient and relayed the message per Dr Cannon Kettle. Lattie Haw ?

## 2021-07-20 NOTE — Telephone Encounter (Signed)
Patient called in wanting to know why another prescription was called in, was it by mistake.  Informed patient to pick up medication, it was not a mistake , and that a nurse would call and go over results with him.

## 2021-07-20 NOTE — Telephone Encounter (Signed)
-----   Message from Landis Martins, [place] sent at 07/20/2021  6:53 AM EDT ----- ?Please let patient know to continue to take his Bactrim antibiotic.  I have also sent ciprofloxacin for him to pick up and take as well since his wound culture came back positive for multiple types of bacteria. ?

## 2021-07-20 NOTE — Progress Notes (Signed)
Wound culture reviewed patient is currently on Bactrim also added ciprofloxacin for complete coverage of all bacteria. ?

## 2021-07-22 ENCOUNTER — Telehealth: Payer: Self-pay | Admitting: Sports Medicine

## 2021-07-22 DIAGNOSIS — D631 Anemia in chronic kidney disease: Secondary | ICD-10-CM | POA: Diagnosis not present

## 2021-07-22 DIAGNOSIS — Z992 Dependence on renal dialysis: Secondary | ICD-10-CM | POA: Diagnosis not present

## 2021-07-22 DIAGNOSIS — E1122 Type 2 diabetes mellitus with diabetic chronic kidney disease: Secondary | ICD-10-CM | POA: Diagnosis not present

## 2021-07-22 DIAGNOSIS — N2581 Secondary hyperparathyroidism of renal origin: Secondary | ICD-10-CM | POA: Diagnosis not present

## 2021-07-22 DIAGNOSIS — N186 End stage renal disease: Secondary | ICD-10-CM | POA: Diagnosis not present

## 2021-07-22 NOTE — Telephone Encounter (Signed)
Pt is experiencing reaction symptoms from ciprofloxacin (CIPRO) 500 MG tablet and is requesting an alternative. Please advise.  ?

## 2021-07-25 DIAGNOSIS — D631 Anemia in chronic kidney disease: Secondary | ICD-10-CM | POA: Diagnosis not present

## 2021-07-25 DIAGNOSIS — N2581 Secondary hyperparathyroidism of renal origin: Secondary | ICD-10-CM | POA: Diagnosis not present

## 2021-07-25 DIAGNOSIS — D509 Iron deficiency anemia, unspecified: Secondary | ICD-10-CM | POA: Diagnosis not present

## 2021-07-25 DIAGNOSIS — E875 Hyperkalemia: Secondary | ICD-10-CM | POA: Diagnosis not present

## 2021-07-25 DIAGNOSIS — N186 End stage renal disease: Secondary | ICD-10-CM | POA: Diagnosis not present

## 2021-07-25 DIAGNOSIS — Z992 Dependence on renal dialysis: Secondary | ICD-10-CM | POA: Diagnosis not present

## 2021-07-25 DIAGNOSIS — E1129 Type 2 diabetes mellitus with other diabetic kidney complication: Secondary | ICD-10-CM | POA: Diagnosis not present

## 2021-07-27 DIAGNOSIS — D631 Anemia in chronic kidney disease: Secondary | ICD-10-CM | POA: Diagnosis not present

## 2021-07-27 DIAGNOSIS — D509 Iron deficiency anemia, unspecified: Secondary | ICD-10-CM | POA: Diagnosis not present

## 2021-07-27 DIAGNOSIS — E875 Hyperkalemia: Secondary | ICD-10-CM | POA: Diagnosis not present

## 2021-07-27 DIAGNOSIS — E1129 Type 2 diabetes mellitus with other diabetic kidney complication: Secondary | ICD-10-CM | POA: Diagnosis not present

## 2021-07-27 DIAGNOSIS — N2581 Secondary hyperparathyroidism of renal origin: Secondary | ICD-10-CM | POA: Diagnosis not present

## 2021-07-27 DIAGNOSIS — N186 End stage renal disease: Secondary | ICD-10-CM | POA: Diagnosis not present

## 2021-07-27 DIAGNOSIS — Z992 Dependence on renal dialysis: Secondary | ICD-10-CM | POA: Diagnosis not present

## 2021-07-28 ENCOUNTER — Other Ambulatory Visit: Payer: Self-pay | Admitting: Cardiology

## 2021-07-29 DIAGNOSIS — Z992 Dependence on renal dialysis: Secondary | ICD-10-CM | POA: Diagnosis not present

## 2021-07-29 DIAGNOSIS — N186 End stage renal disease: Secondary | ICD-10-CM | POA: Diagnosis not present

## 2021-07-29 DIAGNOSIS — E1129 Type 2 diabetes mellitus with other diabetic kidney complication: Secondary | ICD-10-CM | POA: Diagnosis not present

## 2021-07-29 DIAGNOSIS — D631 Anemia in chronic kidney disease: Secondary | ICD-10-CM | POA: Diagnosis not present

## 2021-07-29 DIAGNOSIS — E875 Hyperkalemia: Secondary | ICD-10-CM | POA: Diagnosis not present

## 2021-07-29 DIAGNOSIS — N2581 Secondary hyperparathyroidism of renal origin: Secondary | ICD-10-CM | POA: Diagnosis not present

## 2021-07-29 DIAGNOSIS — D509 Iron deficiency anemia, unspecified: Secondary | ICD-10-CM | POA: Diagnosis not present

## 2021-08-01 DIAGNOSIS — D631 Anemia in chronic kidney disease: Secondary | ICD-10-CM | POA: Diagnosis not present

## 2021-08-01 DIAGNOSIS — G4700 Insomnia, unspecified: Secondary | ICD-10-CM | POA: Diagnosis not present

## 2021-08-01 DIAGNOSIS — E875 Hyperkalemia: Secondary | ICD-10-CM | POA: Diagnosis not present

## 2021-08-01 DIAGNOSIS — R809 Proteinuria, unspecified: Secondary | ICD-10-CM | POA: Diagnosis not present

## 2021-08-01 DIAGNOSIS — M545 Low back pain, unspecified: Secondary | ICD-10-CM | POA: Diagnosis not present

## 2021-08-01 DIAGNOSIS — G8929 Other chronic pain: Secondary | ICD-10-CM | POA: Diagnosis not present

## 2021-08-01 DIAGNOSIS — Z992 Dependence on renal dialysis: Secondary | ICD-10-CM | POA: Diagnosis not present

## 2021-08-01 DIAGNOSIS — I10 Essential (primary) hypertension: Secondary | ICD-10-CM | POA: Diagnosis not present

## 2021-08-01 DIAGNOSIS — E11319 Type 2 diabetes mellitus with unspecified diabetic retinopathy without macular edema: Secondary | ICD-10-CM | POA: Diagnosis not present

## 2021-08-01 DIAGNOSIS — E785 Hyperlipidemia, unspecified: Secondary | ICD-10-CM | POA: Diagnosis not present

## 2021-08-01 DIAGNOSIS — E781 Pure hyperglyceridemia: Secondary | ICD-10-CM | POA: Diagnosis not present

## 2021-08-01 DIAGNOSIS — E1129 Type 2 diabetes mellitus with other diabetic kidney complication: Secondary | ICD-10-CM | POA: Diagnosis not present

## 2021-08-01 DIAGNOSIS — K219 Gastro-esophageal reflux disease without esophagitis: Secondary | ICD-10-CM | POA: Diagnosis not present

## 2021-08-01 DIAGNOSIS — D509 Iron deficiency anemia, unspecified: Secondary | ICD-10-CM | POA: Diagnosis not present

## 2021-08-01 DIAGNOSIS — N2581 Secondary hyperparathyroidism of renal origin: Secondary | ICD-10-CM | POA: Diagnosis not present

## 2021-08-01 DIAGNOSIS — N186 End stage renal disease: Secondary | ICD-10-CM | POA: Diagnosis not present

## 2021-08-02 ENCOUNTER — Ambulatory Visit: Payer: Medicare Other | Admitting: Sports Medicine

## 2021-08-02 ENCOUNTER — Encounter: Payer: Self-pay | Admitting: Sports Medicine

## 2021-08-02 DIAGNOSIS — E1142 Type 2 diabetes mellitus with diabetic polyneuropathy: Secondary | ICD-10-CM

## 2021-08-02 DIAGNOSIS — L97511 Non-pressure chronic ulcer of other part of right foot limited to breakdown of skin: Secondary | ICD-10-CM

## 2021-08-02 DIAGNOSIS — L03031 Cellulitis of right toe: Secondary | ICD-10-CM

## 2021-08-02 DIAGNOSIS — L02611 Cutaneous abscess of right foot: Secondary | ICD-10-CM
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
## 2021-08-03 DIAGNOSIS — D509 Iron deficiency anemia, unspecified: Secondary | ICD-10-CM | POA: Diagnosis not present

## 2021-08-03 DIAGNOSIS — E1129 Type 2 diabetes mellitus with other diabetic kidney complication: Secondary | ICD-10-CM | POA: Diagnosis not present

## 2021-08-03 DIAGNOSIS — N2581 Secondary hyperparathyroidism of renal origin: Secondary | ICD-10-CM | POA: Diagnosis not present

## 2021-08-03 DIAGNOSIS — E875 Hyperkalemia: Secondary | ICD-10-CM | POA: Diagnosis not present

## 2021-08-03 DIAGNOSIS — Z992 Dependence on renal dialysis: Secondary | ICD-10-CM | POA: Diagnosis not present

## 2021-08-03 DIAGNOSIS — N186 End stage renal disease: Secondary | ICD-10-CM | POA: Diagnosis not present

## 2021-08-03 DIAGNOSIS — D631 Anemia in chronic kidney disease: Secondary | ICD-10-CM | POA: Diagnosis not present

## 2021-08-05 DIAGNOSIS — D631 Anemia in chronic kidney disease: Secondary | ICD-10-CM | POA: Diagnosis not present

## 2021-08-05 DIAGNOSIS — Z992 Dependence on renal dialysis: Secondary | ICD-10-CM | POA: Diagnosis not present

## 2021-08-05 DIAGNOSIS — N186 End stage renal disease: Secondary | ICD-10-CM | POA: Diagnosis not present

## 2021-08-05 DIAGNOSIS — N2581 Secondary hyperparathyroidism of renal origin: Secondary | ICD-10-CM | POA: Diagnosis not present

## 2021-08-05 DIAGNOSIS — D509 Iron deficiency anemia, unspecified: Secondary | ICD-10-CM | POA: Diagnosis not present

## 2021-08-05 DIAGNOSIS — E875 Hyperkalemia: Secondary | ICD-10-CM | POA: Diagnosis not present

## 2021-08-05 DIAGNOSIS — E1129 Type 2 diabetes mellitus with other diabetic kidney complication: Secondary | ICD-10-CM | POA: Diagnosis not present
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
## 2021-08-08 ENCOUNTER — Other Ambulatory Visit: Payer: Self-pay | Admitting: *Deleted

## 2021-08-08 ENCOUNTER — Encounter: Payer: Self-pay | Admitting: Cardiology

## 2021-08-08 ENCOUNTER — Ambulatory Visit: Payer: Medicare Other | Admitting: Cardiology

## 2021-08-08 VITALS — BP 136/46 | HR 62 | Ht 72.0 in | Wt 176.0 lb

## 2021-08-08 DIAGNOSIS — N185 Chronic kidney disease, stage 5: Secondary | ICD-10-CM | POA: Diagnosis not present

## 2021-08-08 DIAGNOSIS — E875 Hyperkalemia: Secondary | ICD-10-CM | POA: Diagnosis not present

## 2021-08-08 DIAGNOSIS — I472 Ventricular tachycardia, unspecified: Secondary | ICD-10-CM

## 2021-08-08 DIAGNOSIS — Z992 Dependence on renal dialysis: Secondary | ICD-10-CM | POA: Diagnosis not present

## 2021-08-08 DIAGNOSIS — I110 Hypertensive heart disease with heart failure: Secondary | ICD-10-CM

## 2021-08-08 DIAGNOSIS — Z79899 Other long term (current) drug therapy: Secondary | ICD-10-CM

## 2021-08-08 DIAGNOSIS — N2581 Secondary hyperparathyroidism of renal origin: Secondary | ICD-10-CM | POA: Diagnosis not present

## 2021-08-08 DIAGNOSIS — I480 Paroxysmal atrial fibrillation: Secondary | ICD-10-CM | POA: Diagnosis not present

## 2021-08-08 DIAGNOSIS — Z95810 Presence of automatic (implantable) cardiac defibrillator: Secondary | ICD-10-CM

## 2021-08-08 DIAGNOSIS — I739 Peripheral vascular disease, unspecified: Secondary | ICD-10-CM

## 2021-08-08 DIAGNOSIS — I2510 Atherosclerotic heart disease of native coronary artery without angina pectoris: Secondary | ICD-10-CM

## 2021-08-08 DIAGNOSIS — E782 Mixed hyperlipidemia: Secondary | ICD-10-CM

## 2021-08-08 DIAGNOSIS — E1129 Type 2 diabetes mellitus with other diabetic kidney complication: Secondary | ICD-10-CM | POA: Diagnosis not present

## 2021-08-08 DIAGNOSIS — I5022 Chronic systolic (congestive) heart failure: Secondary | ICD-10-CM | POA: Diagnosis not present

## 2021-08-08 DIAGNOSIS — D509 Iron deficiency anemia, unspecified: Secondary | ICD-10-CM | POA: Diagnosis not present

## 2021-08-08 DIAGNOSIS — N186 End stage renal disease: Secondary | ICD-10-CM | POA: Diagnosis not present

## 2021-08-08 DIAGNOSIS — I70213 Atherosclerosis of native arteries of extremities with intermittent claudication, bilateral legs: Secondary | ICD-10-CM

## 2021-08-08 DIAGNOSIS — D631 Anemia in chronic kidney disease: Secondary | ICD-10-CM | POA: Diagnosis not present

## 2021-08-08 NOTE — Patient Instructions (Signed)
Medication Instructions:  Your physician recommends that you continue on your current medications as directed. Please refer to the Current Medication list given to you today.  *If you need a refill on your cardiac medications before your next appointment, please call your pharmacy*   Lab Work: NONE If you have labs (blood work) drawn today and your tests are completely normal, you will receive your results only by: MyChart Message (if you have MyChart) OR A paper copy in the mail If you have any lab test that is abnormal or we need to change your treatment, we will call you to review the results.   Testing/Procedures: NONE   Follow-Up: At [facility], you and your health needs are our priority.  As part of our continuing mission to provide you with exceptional heart care, we have created designated Provider Care Teams.  These Care Teams include your primary Cardiologist (physician) and Advanced Practice Providers (APPs -  Physician Assistants and Nurse Practitioners) who all work together to provide you with the care you need, when you need it.  We recommend signing up for the patient portal called "MyChart".  Sign up information is provided on this After Visit Summary.  MyChart is used to connect with patients for Virtual Visits (Telemedicine).  Patients are able to view lab/test results, encounter notes, upcoming appointments, etc.  Non-urgent messages can be sent to your provider as well.   To learn more about what you can do with MyChart, go to [email or web address].    Your next appointment:   6 month(s)  The format for your next appointment:   In Person  Provider:   Brian Munley, MD    Other Instructions   Important Information About Sugar       
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
## 2021-08-10 DIAGNOSIS — N186 End stage renal disease: Secondary | ICD-10-CM | POA: Diagnosis not present

## 2021-08-10 DIAGNOSIS — Z992 Dependence on renal dialysis: Secondary | ICD-10-CM | POA: Diagnosis not present

## 2021-08-10 DIAGNOSIS — E1129 Type 2 diabetes mellitus with other diabetic kidney complication: Secondary | ICD-10-CM | POA: Diagnosis not present

## 2021-08-10 DIAGNOSIS — D631 Anemia in chronic kidney disease: Secondary | ICD-10-CM | POA: Diagnosis not present

## 2021-08-10 DIAGNOSIS — D509 Iron deficiency anemia, unspecified: Secondary | ICD-10-CM | POA: Diagnosis not present

## 2021-08-10 DIAGNOSIS — N2581 Secondary hyperparathyroidism of renal origin: Secondary | ICD-10-CM | POA: Diagnosis not present

## 2021-08-10 DIAGNOSIS — E875 Hyperkalemia: Secondary | ICD-10-CM | POA: Diagnosis not present

## 2021-08-11 ENCOUNTER — Ambulatory Visit: Payer: Medicare Other | Admitting: Physician Assistant

## 2021-08-11 ENCOUNTER — Ambulatory Visit (INDEPENDENT_AMBULATORY_CARE_PROVIDER_SITE_OTHER)
Admission: RE | Admit: 2021-08-11 | Discharge: 2021-08-11 | Disposition: A | Discharge status: Home / Self Care | Payer: Medicare Other | Source: Ambulatory Visit | Attending: Surgery | Admitting: Surgery

## 2021-08-11 ENCOUNTER — Ambulatory Visit (HOSPITAL_COMMUNITY)
Admission: RE | Admit: 2021-08-11 | Discharge: 2021-08-11 | Disposition: A | Discharge status: Home / Self Care | Payer: Medicare Other | Source: Ambulatory Visit | Attending: Physician Assistant | Admitting: Physician Assistant

## 2021-08-11 ENCOUNTER — Ambulatory Visit (INDEPENDENT_AMBULATORY_CARE_PROVIDER_SITE_OTHER)
Admission: RE | Admit: 2021-08-11 | Discharge: 2021-08-11 | Disposition: A | Discharge status: Home / Self Care | Payer: Medicare Other | Source: Ambulatory Visit | Attending: Physician Assistant | Admitting: Physician Assistant

## 2021-08-11 VITALS — BP 144/63 | HR 64 | Temp 98.4°F | Resp 20 | Ht 72.0 in | Wt 180.1 lb

## 2021-08-11 DIAGNOSIS — I70213 Atherosclerosis of native arteries of extremities with intermittent claudication, bilateral legs: Secondary | ICD-10-CM

## 2021-08-11 DIAGNOSIS — I739 Peripheral vascular disease, unspecified: Secondary | ICD-10-CM

## 2021-08-12 DIAGNOSIS — Z992 Dependence on renal dialysis: Secondary | ICD-10-CM | POA: Diagnosis not present

## 2021-08-12 DIAGNOSIS — D631 Anemia in chronic kidney disease: Secondary | ICD-10-CM | POA: Diagnosis not present

## 2021-08-12 DIAGNOSIS — D509 Iron deficiency anemia, unspecified: Secondary | ICD-10-CM | POA: Diagnosis not present

## 2021-08-12 DIAGNOSIS — E1129 Type 2 diabetes mellitus with other diabetic kidney complication: Secondary | ICD-10-CM | POA: Diagnosis not present

## 2021-08-12 DIAGNOSIS — N2581 Secondary hyperparathyroidism of renal origin: Secondary | ICD-10-CM | POA: Diagnosis not present

## 2021-08-12 DIAGNOSIS — N186 End stage renal disease: Secondary | ICD-10-CM | POA: Diagnosis not present

## 2021-08-12 DIAGNOSIS — E875 Hyperkalemia: Secondary | ICD-10-CM | POA: Diagnosis not present

## 2021-08-15 DIAGNOSIS — D631 Anemia in chronic kidney disease: Secondary | ICD-10-CM | POA: Diagnosis not present

## 2021-08-15 DIAGNOSIS — D509 Iron deficiency anemia, unspecified: Secondary | ICD-10-CM | POA: Diagnosis not present

## 2021-08-15 DIAGNOSIS — E1129 Type 2 diabetes mellitus with other diabetic kidney complication: Secondary | ICD-10-CM | POA: Diagnosis not present

## 2021-08-15 DIAGNOSIS — N2581 Secondary hyperparathyroidism of renal origin: Secondary | ICD-10-CM | POA: Diagnosis not present

## 2021-08-15 DIAGNOSIS — E875 Hyperkalemia: Secondary | ICD-10-CM | POA: Diagnosis not present

## 2021-08-15 DIAGNOSIS — Z992 Dependence on renal dialysis: Secondary | ICD-10-CM | POA: Diagnosis not present

## 2021-08-15 DIAGNOSIS — N186 End stage renal disease: Secondary | ICD-10-CM | POA: Diagnosis not present

## 2021-08-16 ENCOUNTER — Ambulatory Visit: Payer: Medicare Other | Admitting: Sports Medicine

## 2021-08-16 ENCOUNTER — Encounter: Payer: Self-pay | Admitting: Sports Medicine

## 2021-08-16 DIAGNOSIS — N186 End stage renal disease: Secondary | ICD-10-CM

## 2021-08-16 DIAGNOSIS — Z992 Dependence on renal dialysis: Secondary | ICD-10-CM

## 2021-08-16 DIAGNOSIS — L97511 Non-pressure chronic ulcer of other part of right foot limited to breakdown of skin: Secondary | ICD-10-CM

## 2021-08-16 DIAGNOSIS — L03031 Cellulitis of right toe: Secondary | ICD-10-CM

## 2021-08-16 DIAGNOSIS — I739 Peripheral vascular disease, unspecified: Secondary | ICD-10-CM

## 2021-08-16 DIAGNOSIS — L02611 Cutaneous abscess of right foot: Secondary | ICD-10-CM

## 2021-08-16 DIAGNOSIS — E1142 Type 2 diabetes mellitus with diabetic polyneuropathy: Secondary | ICD-10-CM
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
## 2021-08-17 DIAGNOSIS — D509 Iron deficiency anemia, unspecified: Secondary | ICD-10-CM | POA: Diagnosis not present

## 2021-08-17 DIAGNOSIS — E875 Hyperkalemia: Secondary | ICD-10-CM | POA: Diagnosis not present

## 2021-08-17 DIAGNOSIS — D631 Anemia in chronic kidney disease: Secondary | ICD-10-CM | POA: Diagnosis not present

## 2021-08-17 DIAGNOSIS — N186 End stage renal disease: Secondary | ICD-10-CM | POA: Diagnosis not present

## 2021-08-17 DIAGNOSIS — Z992 Dependence on renal dialysis: Secondary | ICD-10-CM | POA: Diagnosis not present

## 2021-08-17 DIAGNOSIS — E1129 Type 2 diabetes mellitus with other diabetic kidney complication: Secondary | ICD-10-CM | POA: Diagnosis not present

## 2021-08-17 DIAGNOSIS — N2581 Secondary hyperparathyroidism of renal origin: Secondary | ICD-10-CM | POA: Diagnosis not present

## 2021-08-19 DIAGNOSIS — D509 Iron deficiency anemia, unspecified: Secondary | ICD-10-CM | POA: Diagnosis not present

## 2021-08-19 DIAGNOSIS — E1129 Type 2 diabetes mellitus with other diabetic kidney complication: Secondary | ICD-10-CM | POA: Diagnosis not present

## 2021-08-19 DIAGNOSIS — E875 Hyperkalemia: Secondary | ICD-10-CM | POA: Diagnosis not present

## 2021-08-19 DIAGNOSIS — N186 End stage renal disease: Secondary | ICD-10-CM | POA: Diagnosis not present

## 2021-08-19 DIAGNOSIS — D631 Anemia in chronic kidney disease: Secondary | ICD-10-CM | POA: Diagnosis not present

## 2021-08-19 DIAGNOSIS — Z992 Dependence on renal dialysis: Secondary | ICD-10-CM | POA: Diagnosis not present

## 2021-08-19 DIAGNOSIS — N2581 Secondary hyperparathyroidism of renal origin: Secondary | ICD-10-CM | POA: Diagnosis not present

## 2021-08-21 DIAGNOSIS — E1122 Type 2 diabetes mellitus with diabetic chronic kidney disease: Secondary | ICD-10-CM | POA: Diagnosis not present

## 2021-08-21 DIAGNOSIS — Z992 Dependence on renal dialysis: Secondary | ICD-10-CM | POA: Diagnosis not present

## 2021-08-21 DIAGNOSIS — N186 End stage renal disease: Secondary | ICD-10-CM | POA: Diagnosis not present

## 2021-08-22 ENCOUNTER — Other Ambulatory Visit: Payer: Self-pay | Admitting: *Deleted

## 2021-08-22 DIAGNOSIS — D509 Iron deficiency anemia, unspecified: Secondary | ICD-10-CM | POA: Diagnosis not present

## 2021-08-22 DIAGNOSIS — D631 Anemia in chronic kidney disease: Secondary | ICD-10-CM | POA: Diagnosis not present

## 2021-08-22 DIAGNOSIS — I739 Peripheral vascular disease, unspecified: Secondary | ICD-10-CM

## 2021-08-22 DIAGNOSIS — Z992 Dependence on renal dialysis: Secondary | ICD-10-CM | POA: Diagnosis not present

## 2021-08-22 DIAGNOSIS — N186 End stage renal disease: Secondary | ICD-10-CM | POA: Diagnosis not present

## 2021-08-22 DIAGNOSIS — I70213 Atherosclerosis of native arteries of extremities with intermittent claudication, bilateral legs: Secondary | ICD-10-CM

## 2021-08-22 DIAGNOSIS — N2581 Secondary hyperparathyroidism of renal origin: Secondary | ICD-10-CM | POA: Diagnosis not present

## 2021-08-23 IMAGING — DX DG CHEST 1V PORT
1 series · 1 of 1 positions shown · non-contrast
Comparison: May 08, 2018

CLINICAL DATA: Chest pain

EXAM:
PORTABLE CHEST 1 VIEW

[chest ap]
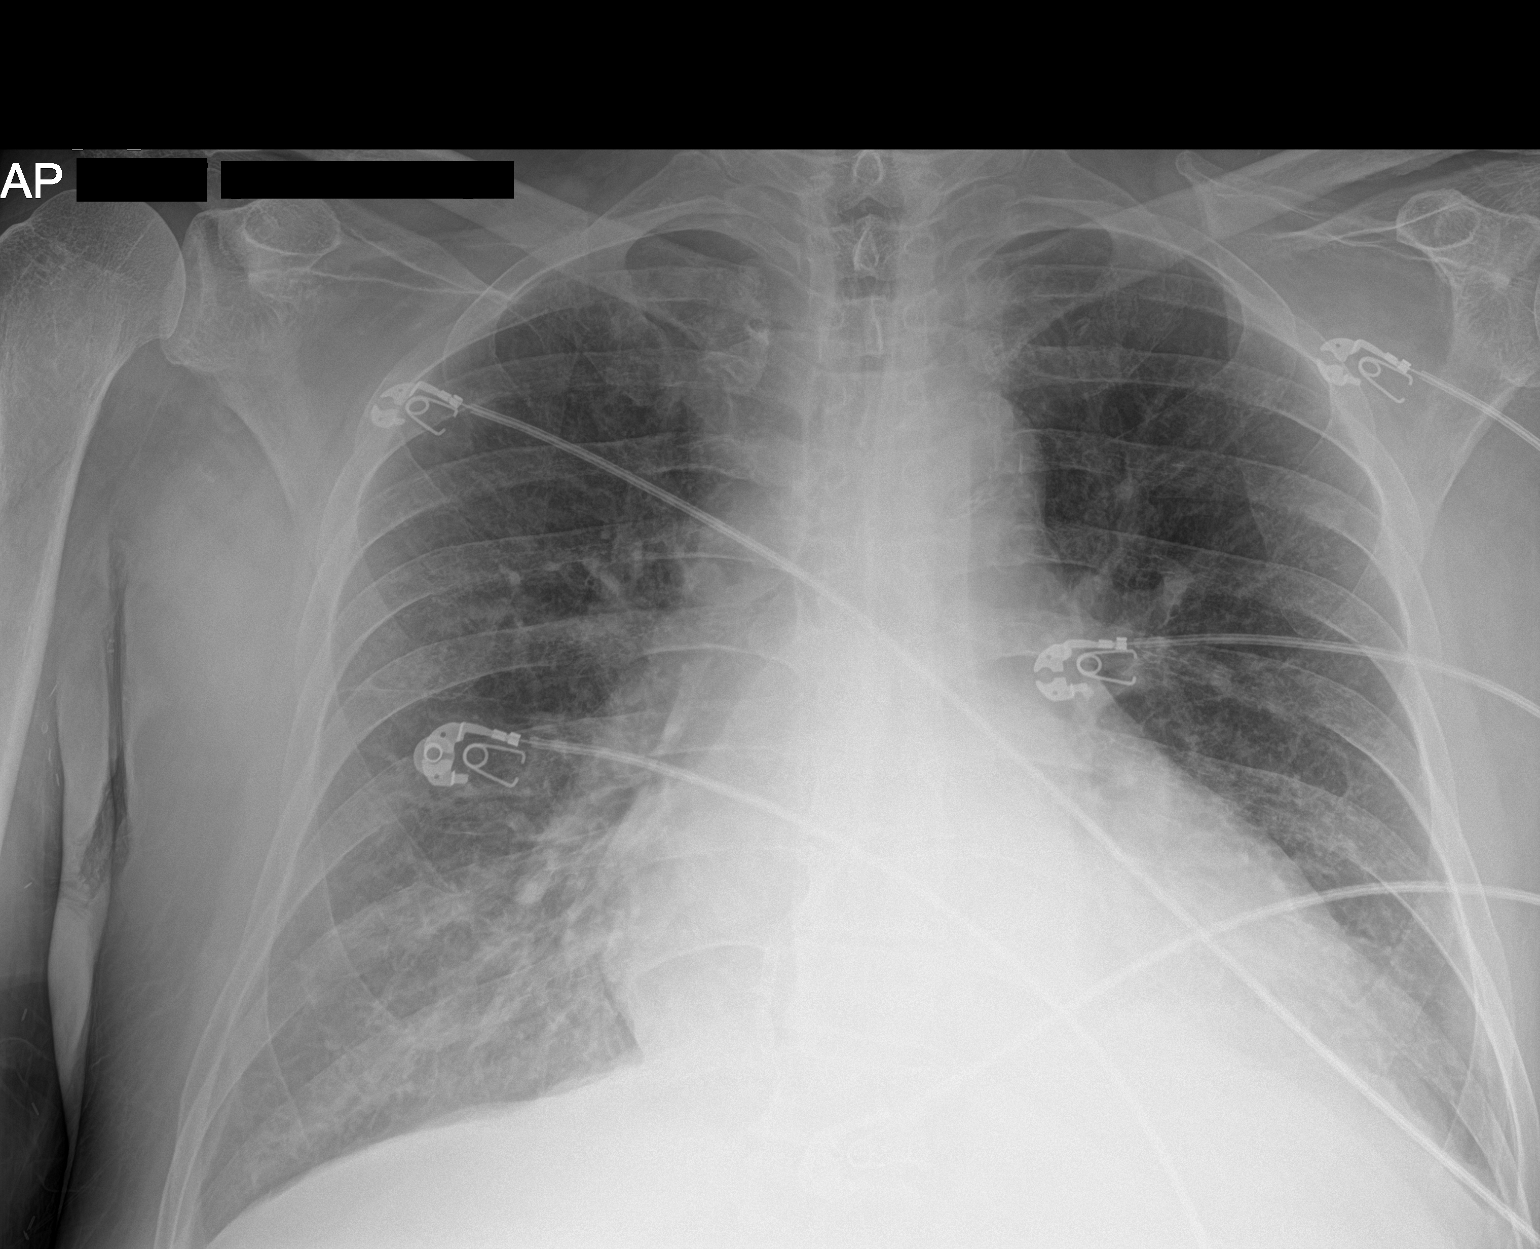

[1 of 1 positions shown; findings below may reference images not displayed]

FINDINGS: No edema or consolidation. There is cardiomegaly with pulmonary
vascularity normal. No adenopathy. There is aortic atherosclerosis.
No bone lesions. No pneumothorax.
IMPRESSION: Cardiomegaly. Lungs clear. No adenopathy. Aortic Atherosclerosis
([id number]).

## 2021-08-24 ENCOUNTER — Encounter: Payer: Self-pay | Admitting: Sports Medicine

## 2021-08-24 ENCOUNTER — Ambulatory Visit: Payer: Medicare Other | Admitting: Sports Medicine

## 2021-08-24 DIAGNOSIS — N186 End stage renal disease: Secondary | ICD-10-CM | POA: Diagnosis not present

## 2021-08-24 DIAGNOSIS — E1142 Type 2 diabetes mellitus with diabetic polyneuropathy: Secondary | ICD-10-CM | POA: Diagnosis not present

## 2021-08-24 DIAGNOSIS — L97511 Non-pressure chronic ulcer of other part of right foot limited to breakdown of skin: Secondary | ICD-10-CM

## 2021-08-24 DIAGNOSIS — R600 Localized edema: Secondary | ICD-10-CM

## 2021-08-24 DIAGNOSIS — Z992 Dependence on renal dialysis: Secondary | ICD-10-CM

## 2021-08-24 DIAGNOSIS — D631 Anemia in chronic kidney disease: Secondary | ICD-10-CM | POA: Diagnosis not present

## 2021-08-24 DIAGNOSIS — D509 Iron deficiency anemia, unspecified: Secondary | ICD-10-CM | POA: Diagnosis not present

## 2021-08-24 DIAGNOSIS — N2581 Secondary hyperparathyroidism of renal origin: Secondary | ICD-10-CM | POA: Diagnosis not present
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
## 2021-08-26 DIAGNOSIS — N2581 Secondary hyperparathyroidism of renal origin: Secondary | ICD-10-CM | POA: Diagnosis not present

## 2021-08-26 DIAGNOSIS — N186 End stage renal disease: Secondary | ICD-10-CM | POA: Diagnosis not present

## 2021-08-26 DIAGNOSIS — D631 Anemia in chronic kidney disease: Secondary | ICD-10-CM | POA: Diagnosis not present

## 2021-08-26 DIAGNOSIS — D509 Iron deficiency anemia, unspecified: Secondary | ICD-10-CM | POA: Diagnosis not present

## 2021-08-26 DIAGNOSIS — Z992 Dependence on renal dialysis: Secondary | ICD-10-CM | POA: Diagnosis not present

## 2021-08-29 ENCOUNTER — Ambulatory Visit (INDEPENDENT_AMBULATORY_CARE_PROVIDER_SITE_OTHER): Payer: Medicare Other

## 2021-08-29 DIAGNOSIS — N2581 Secondary hyperparathyroidism of renal origin: Secondary | ICD-10-CM | POA: Diagnosis not present

## 2021-08-29 DIAGNOSIS — D509 Iron deficiency anemia, unspecified: Secondary | ICD-10-CM | POA: Diagnosis not present

## 2021-08-29 DIAGNOSIS — D631 Anemia in chronic kidney disease: Secondary | ICD-10-CM | POA: Diagnosis not present

## 2021-08-29 DIAGNOSIS — I469 Cardiac arrest, cause unspecified: Secondary | ICD-10-CM | POA: Diagnosis not present

## 2021-08-29 DIAGNOSIS — Z992 Dependence on renal dialysis: Secondary | ICD-10-CM | POA: Diagnosis not present

## 2021-08-29 DIAGNOSIS — N186 End stage renal disease: Secondary | ICD-10-CM | POA: Diagnosis not present

## 2021-08-30 DIAGNOSIS — H401131 Primary open-angle glaucoma, bilateral, mild stage: Secondary | ICD-10-CM | POA: Diagnosis not present

## 2021-08-30 DIAGNOSIS — E113293 Type 2 diabetes mellitus with mild nonproliferative diabetic retinopathy without macular edema, bilateral: Secondary | ICD-10-CM | POA: Diagnosis not present

## 2021-08-30 LAB — CUP PACEART REMOTE DEVICE CHECK
Battery Remaining Percentage: 81 %
Date Time Interrogation Session: 20230508105200
Implantable Lead Implant Date: 20210923
Implantable Lead Location: 753862
Implantable Lead Model: 3501
Implantable Lead Serial Number: 194255
Implantable Pulse Generator Implant Date: 20210923
Pulse Gen Serial Number: 140488

## 2021-08-31 DIAGNOSIS — G4700 Insomnia, unspecified: Secondary | ICD-10-CM | POA: Diagnosis not present

## 2021-08-31 DIAGNOSIS — D631 Anemia in chronic kidney disease: Secondary | ICD-10-CM | POA: Diagnosis not present

## 2021-08-31 DIAGNOSIS — N186 End stage renal disease: Secondary | ICD-10-CM | POA: Diagnosis not present

## 2021-08-31 DIAGNOSIS — E1129 Type 2 diabetes mellitus with other diabetic kidney complication: Secondary | ICD-10-CM | POA: Diagnosis not present

## 2021-08-31 DIAGNOSIS — N2581 Secondary hyperparathyroidism of renal origin: Secondary | ICD-10-CM | POA: Diagnosis not present

## 2021-08-31 DIAGNOSIS — E785 Hyperlipidemia, unspecified: Secondary | ICD-10-CM | POA: Diagnosis not present

## 2021-08-31 DIAGNOSIS — I10 Essential (primary) hypertension: Secondary | ICD-10-CM | POA: Diagnosis not present

## 2021-08-31 DIAGNOSIS — M25551 Pain in right hip: Secondary | ICD-10-CM | POA: Diagnosis not present

## 2021-08-31 DIAGNOSIS — E11319 Type 2 diabetes mellitus with unspecified diabetic retinopathy without macular edema: Secondary | ICD-10-CM | POA: Diagnosis not present

## 2021-08-31 DIAGNOSIS — G8929 Other chronic pain: Secondary | ICD-10-CM | POA: Diagnosis not present

## 2021-08-31 DIAGNOSIS — K219 Gastro-esophageal reflux disease without esophagitis: Secondary | ICD-10-CM | POA: Diagnosis not present

## 2021-08-31 DIAGNOSIS — E781 Pure hyperglyceridemia: Secondary | ICD-10-CM | POA: Diagnosis not present

## 2021-08-31 DIAGNOSIS — Z992 Dependence on renal dialysis: Secondary | ICD-10-CM | POA: Diagnosis not present

## 2021-08-31 DIAGNOSIS — D509 Iron deficiency anemia, unspecified: Secondary | ICD-10-CM | POA: Diagnosis not present

## 2021-08-31 DIAGNOSIS — M545 Low back pain, unspecified: Secondary | ICD-10-CM | POA: Diagnosis not present

## 2021-09-02 DIAGNOSIS — N2581 Secondary hyperparathyroidism of renal origin: Secondary | ICD-10-CM | POA: Diagnosis not present

## 2021-09-02 DIAGNOSIS — Z992 Dependence on renal dialysis: Secondary | ICD-10-CM | POA: Diagnosis not present

## 2021-09-02 DIAGNOSIS — D509 Iron deficiency anemia, unspecified: Secondary | ICD-10-CM | POA: Diagnosis not present

## 2021-09-02 DIAGNOSIS — N186 End stage renal disease: Secondary | ICD-10-CM | POA: Diagnosis not present

## 2021-09-02 DIAGNOSIS — D631 Anemia in chronic kidney disease: Secondary | ICD-10-CM | POA: Diagnosis not present

## 2021-09-05 DIAGNOSIS — D631 Anemia in chronic kidney disease: Secondary | ICD-10-CM | POA: Diagnosis not present

## 2021-09-05 DIAGNOSIS — N186 End stage renal disease: Secondary | ICD-10-CM | POA: Diagnosis not present

## 2021-09-05 DIAGNOSIS — Z992 Dependence on renal dialysis: Secondary | ICD-10-CM | POA: Diagnosis not present

## 2021-09-05 DIAGNOSIS — N2581 Secondary hyperparathyroidism of renal origin: Secondary | ICD-10-CM | POA: Diagnosis not present

## 2021-09-05 DIAGNOSIS — D509 Iron deficiency anemia, unspecified: Secondary | ICD-10-CM | POA: Diagnosis not present

## 2021-09-06 ENCOUNTER — Ambulatory Visit: Payer: Medicare Other | Admitting: Sports Medicine

## 2021-09-06 ENCOUNTER — Encounter: Payer: Self-pay | Admitting: Sports Medicine

## 2021-09-06 DIAGNOSIS — I739 Peripheral vascular disease, unspecified: Secondary | ICD-10-CM

## 2021-09-06 DIAGNOSIS — L97511 Non-pressure chronic ulcer of other part of right foot limited to breakdown of skin: Secondary | ICD-10-CM | POA: Diagnosis not present

## 2021-09-06 DIAGNOSIS — N186 End stage renal disease: Secondary | ICD-10-CM | POA: Diagnosis not present

## 2021-09-06 DIAGNOSIS — Z992 Dependence on renal dialysis: Secondary | ICD-10-CM | POA: Diagnosis not present

## 2021-09-06 DIAGNOSIS — E1142 Type 2 diabetes mellitus with diabetic polyneuropathy: Secondary | ICD-10-CM
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
## 2021-09-07 DIAGNOSIS — D509 Iron deficiency anemia, unspecified: Secondary | ICD-10-CM | POA: Diagnosis not present

## 2021-09-07 DIAGNOSIS — N186 End stage renal disease: Secondary | ICD-10-CM | POA: Diagnosis not present

## 2021-09-07 DIAGNOSIS — D631 Anemia in chronic kidney disease: Secondary | ICD-10-CM | POA: Diagnosis not present

## 2021-09-07 DIAGNOSIS — N2581 Secondary hyperparathyroidism of renal origin: Secondary | ICD-10-CM | POA: Diagnosis not present

## 2021-09-07 DIAGNOSIS — Z992 Dependence on renal dialysis: Secondary | ICD-10-CM | POA: Diagnosis not present

## 2021-09-09 DIAGNOSIS — N186 End stage renal disease: Secondary | ICD-10-CM | POA: Diagnosis not present

## 2021-09-09 DIAGNOSIS — D509 Iron deficiency anemia, unspecified: Secondary | ICD-10-CM | POA: Diagnosis not present

## 2021-09-09 DIAGNOSIS — D631 Anemia in chronic kidney disease: Secondary | ICD-10-CM | POA: Diagnosis not present

## 2021-09-09 DIAGNOSIS — Z992 Dependence on renal dialysis: Secondary | ICD-10-CM | POA: Diagnosis not present

## 2021-09-09 DIAGNOSIS — N2581 Secondary hyperparathyroidism of renal origin: Secondary | ICD-10-CM | POA: Diagnosis not present

## 2021-09-12 DIAGNOSIS — Z992 Dependence on renal dialysis: Secondary | ICD-10-CM | POA: Diagnosis not present

## 2021-09-12 DIAGNOSIS — N186 End stage renal disease: Secondary | ICD-10-CM | POA: Diagnosis not present

## 2021-09-12 DIAGNOSIS — D509 Iron deficiency anemia, unspecified: Secondary | ICD-10-CM | POA: Diagnosis not present

## 2021-09-12 DIAGNOSIS — D631 Anemia in chronic kidney disease: Secondary | ICD-10-CM | POA: Diagnosis not present

## 2021-09-12 DIAGNOSIS — N2581 Secondary hyperparathyroidism of renal origin: Secondary | ICD-10-CM | POA: Diagnosis not present
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
## 2021-09-14 DIAGNOSIS — Z992 Dependence on renal dialysis: Secondary | ICD-10-CM | POA: Diagnosis not present

## 2021-09-14 DIAGNOSIS — D509 Iron deficiency anemia, unspecified: Secondary | ICD-10-CM | POA: Diagnosis not present

## 2021-09-14 DIAGNOSIS — K219 Gastro-esophageal reflux disease without esophagitis: Secondary | ICD-10-CM | POA: Diagnosis not present

## 2021-09-14 DIAGNOSIS — E1129 Type 2 diabetes mellitus with other diabetic kidney complication: Secondary | ICD-10-CM | POA: Diagnosis not present

## 2021-09-14 DIAGNOSIS — N186 End stage renal disease: Secondary | ICD-10-CM | POA: Diagnosis not present

## 2021-09-14 DIAGNOSIS — N2581 Secondary hyperparathyroidism of renal origin: Secondary | ICD-10-CM | POA: Diagnosis not present

## 2021-09-14 DIAGNOSIS — E781 Pure hyperglyceridemia: Secondary | ICD-10-CM | POA: Diagnosis not present

## 2021-09-14 DIAGNOSIS — R809 Proteinuria, unspecified: Secondary | ICD-10-CM | POA: Diagnosis not present

## 2021-09-14 DIAGNOSIS — D631 Anemia in chronic kidney disease: Secondary | ICD-10-CM | POA: Diagnosis not present

## 2021-09-14 DIAGNOSIS — Z6824 Body mass index (BMI) 24.0-24.9, adult: Secondary | ICD-10-CM | POA: Diagnosis not present

## 2021-09-14 DIAGNOSIS — M25551 Pain in right hip: Secondary | ICD-10-CM | POA: Diagnosis not present

## 2021-09-14 DIAGNOSIS — E785 Hyperlipidemia, unspecified: Secondary | ICD-10-CM | POA: Diagnosis not present

## 2021-09-14 NOTE — Progress Notes (Signed)
Remote ICD transmission.   

## 2021-09-15 ENCOUNTER — Other Ambulatory Visit: Payer: Self-pay

## 2021-09-15 ENCOUNTER — Encounter: Payer: Self-pay | Admitting: Physician Assistant

## 2021-09-15 ENCOUNTER — Ambulatory Visit: Payer: Medicare Other | Admitting: Physician Assistant

## 2021-09-15 ENCOUNTER — Ambulatory Visit (HOSPITAL_COMMUNITY)
Admission: RE | Admit: 2021-09-15 | Discharge: 2021-09-15 | Disposition: A | Discharge status: Home / Self Care | Payer: Medicare Other | Source: Ambulatory Visit | Attending: Vascular Surgery | Admitting: Vascular Surgery

## 2021-09-15 VITALS — BP 157/71 | HR 62 | Temp 97.9°F | Resp 20 | Ht 72.0 in | Wt 178.4 lb

## 2021-09-15 DIAGNOSIS — I70213 Atherosclerosis of native arteries of extremities with intermittent claudication, bilateral legs: Secondary | ICD-10-CM | POA: Diagnosis not present

## 2021-09-15 DIAGNOSIS — I739 Peripheral vascular disease, unspecified: Secondary | ICD-10-CM

## 2021-09-15 DIAGNOSIS — S91301A Unspecified open wound, right foot, initial encounter: Secondary | ICD-10-CM

## 2021-09-15 MED ORDER — CEPHALEXIN 500 MG PO CAPS: 500.0000 mg | ORAL_CAPSULE | Freq: Two times a day (BID) | ORAL | 0 refills | Status: DC

## 2021-09-16 DIAGNOSIS — Z992 Dependence on renal dialysis: Secondary | ICD-10-CM | POA: Diagnosis not present

## 2021-09-16 DIAGNOSIS — D509 Iron deficiency anemia, unspecified: Secondary | ICD-10-CM | POA: Diagnosis not present

## 2021-09-16 DIAGNOSIS — N186 End stage renal disease: Secondary | ICD-10-CM | POA: Diagnosis not present

## 2021-09-16 DIAGNOSIS — N2581 Secondary hyperparathyroidism of renal origin: Secondary | ICD-10-CM | POA: Diagnosis not present

## 2021-09-16 DIAGNOSIS — D631 Anemia in chronic kidney disease: Secondary | ICD-10-CM | POA: Diagnosis not present

## 2021-09-18 ENCOUNTER — Other Ambulatory Visit: Payer: Self-pay | Admitting: Cardiology

## 2021-09-19 DIAGNOSIS — N186 End stage renal disease: Secondary | ICD-10-CM | POA: Diagnosis not present

## 2021-09-19 DIAGNOSIS — D631 Anemia in chronic kidney disease: Secondary | ICD-10-CM | POA: Diagnosis not present

## 2021-09-19 DIAGNOSIS — N2581 Secondary hyperparathyroidism of renal origin: Secondary | ICD-10-CM | POA: Diagnosis not present

## 2021-09-19 DIAGNOSIS — Z992 Dependence on renal dialysis: Secondary | ICD-10-CM | POA: Diagnosis not present

## 2021-09-19 DIAGNOSIS — D509 Iron deficiency anemia, unspecified: Secondary | ICD-10-CM | POA: Diagnosis not present

## 2021-09-20 ENCOUNTER — Ambulatory Visit (HOSPITAL_COMMUNITY)
Admission: RE | Admit: 2021-09-20 | Discharge: 2021-09-20 | Disposition: A | Discharge status: Home / Self Care | Payer: Medicare Other | Attending: Surgery | Admitting: Surgery

## 2021-09-20 ENCOUNTER — Encounter (HOSPITAL_COMMUNITY)
Admission: RE | Disposition: A | Discharge status: Home / Self Care | Payer: Self-pay | Source: Home / Self Care | Attending: Surgery

## 2021-09-20 ENCOUNTER — Ambulatory Visit: Payer: Medicare Other | Admitting: Sports Medicine

## 2021-09-20 ENCOUNTER — Other Ambulatory Visit: Payer: Self-pay

## 2021-09-20 DIAGNOSIS — L97519 Non-pressure chronic ulcer of other part of right foot with unspecified severity: Secondary | ICD-10-CM | POA: Insufficient documentation

## 2021-09-20 DIAGNOSIS — I70235 Atherosclerosis of native arteries of right leg with ulceration of other part of foot: Secondary | ICD-10-CM | POA: Diagnosis not present

## 2021-09-20 DIAGNOSIS — G629 Polyneuropathy, unspecified: Secondary | ICD-10-CM | POA: Diagnosis not present

## 2021-09-20 DIAGNOSIS — Z87891 Personal history of nicotine dependence: Secondary | ICD-10-CM | POA: Diagnosis not present

## 2021-09-20 DIAGNOSIS — Z79899 Other long term (current) drug therapy: Secondary | ICD-10-CM | POA: Diagnosis not present

## 2021-09-20 DIAGNOSIS — Z992 Dependence on renal dialysis: Secondary | ICD-10-CM | POA: Insufficient documentation

## 2021-09-20 DIAGNOSIS — E1151 Type 2 diabetes mellitus with diabetic peripheral angiopathy without gangrene: Secondary | ICD-10-CM | POA: Diagnosis not present

## 2021-09-20 DIAGNOSIS — Z7902 Long term (current) use of antithrombotics/antiplatelets: Secondary | ICD-10-CM | POA: Diagnosis not present

## 2021-09-20 DIAGNOSIS — E1122 Type 2 diabetes mellitus with diabetic chronic kidney disease: Secondary | ICD-10-CM | POA: Diagnosis not present

## 2021-09-20 DIAGNOSIS — I120 Hypertensive chronic kidney disease with stage 5 chronic kidney disease or end stage renal disease: Secondary | ICD-10-CM | POA: Diagnosis not present

## 2021-09-20 DIAGNOSIS — N186 End stage renal disease: Secondary | ICD-10-CM | POA: Insufficient documentation

## 2021-09-20 DIAGNOSIS — I714 Abdominal aortic aneurysm, without rupture, unspecified: Secondary | ICD-10-CM | POA: Diagnosis not present

## 2021-09-20 DIAGNOSIS — E11621 Type 2 diabetes mellitus with foot ulcer: Secondary | ICD-10-CM | POA: Insufficient documentation

## 2021-09-20 DIAGNOSIS — S91301A Unspecified open wound, right foot, initial encounter: Secondary | ICD-10-CM

## 2021-09-20 HISTORY — PX: PERIPHERAL VASCULAR INTERVENTION: CATH118257

## 2021-09-20 HISTORY — PX: ABDOMINAL AORTOGRAM W/LOWER EXTREMITY: CATH118223

## 2021-09-20 LAB — GLUCOSE, CAPILLARY
Glucose-Capillary: 147 mg/dL — ABNORMAL HIGH (ref 70–99)
Glucose-Capillary: 147 mg/dL — ABNORMAL HIGH (ref 70–99)

## 2021-09-20 LAB — POCT ACTIVATED CLOTTING TIME
Activated Clotting Time: 227 seconds
Activated Clotting Time: 215 seconds
Activated Clotting Time: 179 seconds

## 2021-09-20 LAB — POCT I-STAT, CHEM 8
BUN: 46 mg/dL — ABNORMAL HIGH (ref 8–23)
Calcium, Ion: 1.25 mmol/L (ref 1.15–1.40)
Chloride: 98 mmol/L (ref 98–111)
Creatinine, Ser: 4.5 mg/dL — ABNORMAL HIGH (ref 0.61–1.24)
Glucose, Bld: 157 mg/dL — ABNORMAL HIGH (ref 70–99)
HCT: 36 % — ABNORMAL LOW (ref 39.0–52.0)
Hemoglobin: 12.2 g/dL — ABNORMAL LOW (ref 13.0–17.0)
Potassium: 4.8 mmol/L (ref 3.5–5.1)
Sodium: 136 mmol/L (ref 135–145)
TCO2: 28 mmol/L (ref 22–32)

## 2021-09-20 SURGERY — ABDOMINAL AORTOGRAM W/LOWER EXTREMITY
Anesthesia: LOCAL

## 2021-09-20 MED ORDER — SODIUM CHLORIDE 0.9 % IV SOLN: 250.0000 mL | INTRAVENOUS | Status: DC | PRN

## 2021-09-20 MED ORDER — FENTANYL CITRATE (PF) 100 MCG/2ML IJ SOLN
INTRAMUSCULAR | Status: DC | PRN
  Administered 2021-09-20: 25 ug via INTRAVENOUS
  Administered 2021-09-20: 50 ug via INTRAVENOUS

## 2021-09-20 MED ORDER — ACETAMINOPHEN 325 MG PO TABS: 650.0000 mg | ORAL_TABLET | ORAL | Status: DC | PRN

## 2021-09-20 MED ORDER — LABETALOL HCL 5 MG/ML IV SOLN: 10.0000 mg | INTRAVENOUS | Status: DC | PRN

## 2021-09-20 MED ORDER — SODIUM CHLORIDE 0.9 % WEIGHT BASED INFUSION
1.0000 mL/kg/h | INTRAVENOUS | Status: DC
Start: 2021-09-20 — End: 2021-09-20

## 2021-09-20 MED ORDER — HYDRALAZINE HCL 20 MG/ML IJ SOLN
5.0000 mg | INTRAMUSCULAR | Status: DC | PRN
  Administered 2021-09-20: 5 mg via INTRAVENOUS

## 2021-09-20 MED ORDER — HEPARIN SODIUM (PORCINE) 1000 UNIT/ML IJ SOLN
INTRAMUSCULAR | Status: DC | PRN
  Administered 2021-09-20: 1000 [IU] via INTRAVENOUS
  Administered 2021-09-20: 8000 [IU] via INTRAVENOUS

## 2021-09-20 MED ORDER — ONDANSETRON HCL 4 MG/2ML IJ SOLN: 4.0000 mg | Freq: Four times a day (QID) | INTRAMUSCULAR | Status: DC | PRN

## 2021-09-20 MED ORDER — HEPARIN (PORCINE) IN NACL 1000-0.9 UT/500ML-% IV SOLN
INTRAVENOUS | Status: DC | PRN
  Administered 2021-09-20 (×2): 500 mL

## 2021-09-20 MED ORDER — MIDAZOLAM HCL 2 MG/2ML IJ SOLN
INTRAMUSCULAR | Status: DC | PRN
  Administered 2021-09-20: 2 mg via INTRAVENOUS
  Administered 2021-09-20: 1 mg via INTRAVENOUS

## 2021-09-20 MED ORDER — IODIXANOL 320 MG/ML IV SOLN
INTRAVENOUS | Status: DC | PRN
  Administered 2021-09-20: 210 mL

## 2021-09-20 MED ORDER — SODIUM CHLORIDE 0.9% FLUSH: 3.0000 mL | INTRAVENOUS | Status: DC | PRN

## 2021-09-20 MED ORDER — SODIUM CHLORIDE 0.9% FLUSH: 3.0000 mL | Freq: Two times a day (BID) | INTRAVENOUS | Status: DC

## 2021-09-20 MED ORDER — HYDRALAZINE HCL 20 MG/ML IJ SOLN
INTRAMUSCULAR | Status: AC
  Filled 2021-09-20: qty 1

## 2021-09-20 MED ORDER — LIDOCAINE HCL (PF) 1 % IJ SOLN
INTRAMUSCULAR | Status: DC | PRN
  Administered 2021-09-20: 12 mL

## 2021-09-20 SURGICAL SUPPLY — 25 items
BAG SNAP BAND KOVER 36X36 (MISCELLANEOUS) ×1 IMPLANT
BALLN MUSTANG 4X150X135 (BALLOONS) ×3
BALLN MUSTANG 5X120X135 (BALLOONS) ×3
BALLOON MUSTANG 4X150X135 (BALLOONS) IMPLANT
BALLOON MUSTANG 5X120X135 (BALLOONS) IMPLANT
CATH CROSS OVER TEMPO 5F (CATHETERS) ×1 IMPLANT
CATH OMNI FLUSH 5F 65CM (CATHETERS) ×1 IMPLANT
CATH QUICKCROSS SUPP .035X90CM (MICROCATHETER) ×1 IMPLANT
COVER DOME SNAP 22 D (MISCELLANEOUS) ×1 IMPLANT
DEVICE TORQUE H2O (MISCELLANEOUS) ×1 IMPLANT
GLIDEWIRE ADV .035X260CM (WIRE) ×1 IMPLANT
GUIDEWIRE ANGLED .035X150CM (WIRE) ×1 IMPLANT
KIT ENCORE 26 ADVANTAGE (KITS) ×1 IMPLANT
KIT MICROPUNCTURE NIT STIFF (SHEATH) ×1 IMPLANT
KIT PV (KITS) ×3 IMPLANT
SHEATH PINNACLE 5F 10CM (SHEATH) ×1 IMPLANT
SHEATH PINNACLE ST 6F 45CM (SHEATH) ×1 IMPLANT
SHEATH PROBE COVER 6X72 (BAG) ×1 IMPLANT
STENT ELUVIA 6X150X130 (Permanent Stent) ×1 IMPLANT
STENT ELUVIA 6X60X130 (Permanent Stent) ×1 IMPLANT
SYR MEDRAD MARK V 150ML (SYRINGE) ×1 IMPLANT
TRANSDUCER W/STOPCOCK (MISCELLANEOUS) ×3 IMPLANT
TRAY PV CATH (CUSTOM PROCEDURE TRAY) ×3 IMPLANT
WIRE BENTSON .035X145CM (WIRE) ×1 IMPLANT
WIRE HI TORQ VERSACORE 300 (WIRE) ×1 IMPLANT

## 2021-09-20 NOTE — Op Note (Signed)
Patient name: James Chan MRN: [id number] DOB: 06/01/1950 Sex: male  09/20/2021 Pre-operative Diagnosis: Right toe ulcer Post-operative diagnosis:  Same Surgeon:  [name] Procedure Performed:  1.  Ultrasound-guided access, left femoral artery  2.  Abdominal aortogram  3.  Bilateral lower extremity runoff  4.  Stent, right superficial femoral artery  5.  Conscious sedation, 95 minutes  Indications: This is a 71 year old gentleman with nonhealing wound to his right great toe.  He comes in today for further evaluation and possible intervention  Procedure:  The patient was identified in the holding area and taken to room 8.  The patient was then placed supine on the table and prepped and draped in the usual sterile fashion.  A time out was called.  Conscious sedation was administered with the use of IV fentanyl and Versed under continuous physician and nurse monitoring.  Heart rate, blood pressure, and oxygen saturation were continuously monitored.  Total sedation time was 95 minutes.  Ultrasound was used to evaluate the left common femoral artery.  It was patent .  A digital ultrasound image was acquired.  A micropuncture needle was used to access the left common femoral artery under ultrasound guidance.  An 018 wire was advanced without resistance and a micropuncture sheath was placed.  The 018 wire was removed and a benson wire was placed.  The micropuncture sheath was exchanged for a 5 french sheath.  An omniflush catheter was advanced over the wire to the level of L-1.  An abdominal angiogram was obtained.  Next, using the omniflush catheter and a benson wire, the aortic bifurcation was crossed and the catheter was placed into theright external iliac artery and right runoff was obtained.  left runoff was performed via retrograde sheath injections.  Findings:   Aortogram: Proximal renal arteries were not well visualized but no obvious stenosis identified.  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  There is approximately a 40 to 50% ostial right and left common iliac artery stenosis.  The right iliac stent is widely patent.  On the left there is at least a 50% stenosis within the proximal left external iliac artery  Right Lower Extremity: The right common femoral and profundofemoral artery are calcified but patent without significant stenosis.  The superficial femoral artery is diffusely diseased above the previously placed stent in the adductor canal.  Stenoses are greater than 90%.  The below-knee popliteal artery is widely patent with three-vessel runoff.  There is diffuse disease out onto the foot.  Left Lower Extremity: Left common femoral profundofemoral artery are widely patent.  The superficial femoral artery is calcified but patent without significant stenosis.  Its associated stent is widely patent.  Popliteal artery is patent throughout its course however there is approximately a 50% stenosis behind the knee.  There is three-vessel runoff.  Intervention: After the above images were acquired the decision was made to proceed with intervention.  A 6 French 45 cm sheath was advanced over the aortic bifurcation and placed in the right superficial femoral artery.  I then performed recanalization of the right superficial femoral artery.  I ended up in a dissection plane but was ultimately able to get back into the native lumen.  This was confirmed with a contrast injection.  I crossed the lesion with an 035 Glidewire and a quick cross.  I then inserted a long versa core wire.  I then elected to predilate the superficial femoral artery with a 4 mm Mustang balloon.  I then placed a 6 x 150 followed by 6 x 80 Elluvia stents which were postdilated with a 5 mm balloon.  Completion imaging showed a widely patent superficial femoral artery with no change in runoff.  The patient was taken the holding area for sheath pull once his coagulation profile corrects.  Impression:  #1  Diffuse  high-grade, greater than 80% right superficial femoral artery stenosis, successfully treated using overlapping 6 mm Elluvia stents.  #2  If the patient continues to have difficulty with wound healing, the next option would be a right femoral endarterectomy.  #3  Patent right common and external iliac artery stents with no in-stent stenosis.  There is approximately a 50% stenosis at the origin of the right common iliac artery.  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  #4  50-60% proximal left external iliac artery stenosis.  #5  Left popliteal artery stenosis behind the knee approximately 50%   V. [name], M.D., [facility] [place] Office: [phone] Pager:  [phone]

## 2021-09-20 NOTE — Interval H&P Note (Signed)
History and Physical Interval Note:  09/20/2021 10:07 AM  James Chan  has presented today for surgery, with the diagnosis of right great toe wound.  The various methods of treatment have been discussed with the patient and family. After consideration of risks, benefits and other options for treatment, the patient has consented to  Procedure(s): ABDOMINAL AORTOGRAM W/LOWER EXTREMITY (N/A) as a surgical intervention.  The patient's history has been reviewed, patient examined, no change in status, stable for surgery.  I have reviewed the patient's chart and labs.  Questions were answered to the patient's satisfaction.     [name]

## 2021-09-20 NOTE — Progress Notes (Signed)
Site area: Left femoral sheath  Site Prior to Removal:  Level 0  Pressure Applied: 20 minutes  Manual: Yes  Patient Status During Pull: Stable  Post Sheath Removal Site:  Level 0  Post Sheath Removal Instructions Given:  yes  Post Sheath Pulses Present: Left PT Doppler   Dressing Applied: gauze and tegaderm   Bedrest: Begins @ 1440  Comments:

## 2021-09-21 ENCOUNTER — Encounter (HOSPITAL_COMMUNITY): Payer: Self-pay | Admitting: Surgery

## 2021-09-21 DIAGNOSIS — N2581 Secondary hyperparathyroidism of renal origin: Secondary | ICD-10-CM | POA: Diagnosis not present

## 2021-09-21 DIAGNOSIS — E1122 Type 2 diabetes mellitus with diabetic chronic kidney disease: Secondary | ICD-10-CM | POA: Diagnosis not present

## 2021-09-21 DIAGNOSIS — D509 Iron deficiency anemia, unspecified: Secondary | ICD-10-CM | POA: Diagnosis not present

## 2021-09-21 DIAGNOSIS — Z992 Dependence on renal dialysis: Secondary | ICD-10-CM | POA: Diagnosis not present

## 2021-09-21 DIAGNOSIS — N186 End stage renal disease: Secondary | ICD-10-CM | POA: Diagnosis not present

## 2021-09-21 DIAGNOSIS — D631 Anemia in chronic kidney disease: Secondary | ICD-10-CM | POA: Diagnosis not present

## 2021-09-23 DIAGNOSIS — N2581 Secondary hyperparathyroidism of renal origin: Secondary | ICD-10-CM | POA: Diagnosis not present

## 2021-09-23 DIAGNOSIS — N186 End stage renal disease: Secondary | ICD-10-CM | POA: Diagnosis not present

## 2021-09-23 DIAGNOSIS — E875 Hyperkalemia: Secondary | ICD-10-CM | POA: Diagnosis not present

## 2021-09-23 DIAGNOSIS — D509 Iron deficiency anemia, unspecified: Secondary | ICD-10-CM | POA: Diagnosis not present

## 2021-09-23 DIAGNOSIS — Z992 Dependence on renal dialysis: Secondary | ICD-10-CM | POA: Diagnosis not present

## 2021-09-23 DIAGNOSIS — D631 Anemia in chronic kidney disease: Secondary | ICD-10-CM | POA: Diagnosis not present

## 2021-09-26 DIAGNOSIS — Z992 Dependence on renal dialysis: Secondary | ICD-10-CM | POA: Diagnosis not present

## 2021-09-26 DIAGNOSIS — D631 Anemia in chronic kidney disease: Secondary | ICD-10-CM | POA: Diagnosis not present

## 2021-09-26 DIAGNOSIS — D509 Iron deficiency anemia, unspecified: Secondary | ICD-10-CM | POA: Diagnosis not present

## 2021-09-26 DIAGNOSIS — N186 End stage renal disease: Secondary | ICD-10-CM | POA: Diagnosis not present

## 2021-09-26 DIAGNOSIS — N2581 Secondary hyperparathyroidism of renal origin: Secondary | ICD-10-CM | POA: Diagnosis not present

## 2021-09-26 DIAGNOSIS — E875 Hyperkalemia: Secondary | ICD-10-CM | POA: Diagnosis not present

## 2021-09-28 DIAGNOSIS — D509 Iron deficiency anemia, unspecified: Secondary | ICD-10-CM | POA: Diagnosis not present

## 2021-09-28 DIAGNOSIS — E875 Hyperkalemia: Secondary | ICD-10-CM | POA: Diagnosis not present

## 2021-09-28 DIAGNOSIS — D631 Anemia in chronic kidney disease: Secondary | ICD-10-CM | POA: Diagnosis not present

## 2021-09-28 DIAGNOSIS — Z992 Dependence on renal dialysis: Secondary | ICD-10-CM | POA: Diagnosis not present

## 2021-09-28 DIAGNOSIS — N2581 Secondary hyperparathyroidism of renal origin: Secondary | ICD-10-CM | POA: Diagnosis not present

## 2021-09-28 DIAGNOSIS — N186 End stage renal disease: Secondary | ICD-10-CM | POA: Diagnosis not present

## 2021-09-29 DIAGNOSIS — G4700 Insomnia, unspecified: Secondary | ICD-10-CM | POA: Diagnosis not present

## 2021-09-29 DIAGNOSIS — E781 Pure hyperglyceridemia: Secondary | ICD-10-CM | POA: Diagnosis not present

## 2021-09-29 DIAGNOSIS — E11319 Type 2 diabetes mellitus with unspecified diabetic retinopathy without macular edema: Secondary | ICD-10-CM | POA: Diagnosis not present

## 2021-09-29 DIAGNOSIS — M545 Low back pain, unspecified: Secondary | ICD-10-CM | POA: Diagnosis not present

## 2021-09-29 DIAGNOSIS — E785 Hyperlipidemia, unspecified: Secondary | ICD-10-CM | POA: Diagnosis not present

## 2021-09-29 DIAGNOSIS — I10 Essential (primary) hypertension: Secondary | ICD-10-CM | POA: Diagnosis not present

## 2021-09-29 DIAGNOSIS — G8929 Other chronic pain: Secondary | ICD-10-CM | POA: Diagnosis not present

## 2021-09-30 ENCOUNTER — Encounter: Payer: Self-pay | Admitting: Podiatrist

## 2021-09-30 ENCOUNTER — Ambulatory Visit: Payer: Medicare Other | Admitting: Podiatrist

## 2021-09-30 DIAGNOSIS — L97512 Non-pressure chronic ulcer of other part of right foot with fat layer exposed: Secondary | ICD-10-CM

## 2021-09-30 DIAGNOSIS — I739 Peripheral vascular disease, unspecified: Secondary | ICD-10-CM | POA: Diagnosis not present

## 2021-09-30 DIAGNOSIS — R600 Localized edema: Secondary | ICD-10-CM

## 2021-09-30 DIAGNOSIS — E875 Hyperkalemia: Secondary | ICD-10-CM | POA: Diagnosis not present

## 2021-09-30 DIAGNOSIS — N2581 Secondary hyperparathyroidism of renal origin: Secondary | ICD-10-CM | POA: Diagnosis not present

## 2021-09-30 DIAGNOSIS — N186 End stage renal disease: Secondary | ICD-10-CM | POA: Diagnosis not present

## 2021-09-30 DIAGNOSIS — D631 Anemia in chronic kidney disease: Secondary | ICD-10-CM | POA: Diagnosis not present

## 2021-09-30 DIAGNOSIS — D509 Iron deficiency anemia, unspecified: Secondary | ICD-10-CM | POA: Diagnosis not present

## 2021-09-30 DIAGNOSIS — Z992 Dependence on renal dialysis: Secondary | ICD-10-CM | POA: Diagnosis not present

## 2021-09-30 MED ORDER — SANTYL 250 UNIT/GM EX OINT: 1.0000 "application " | TOPICAL_OINTMENT | Freq: Every day | CUTANEOUS | 0 refills | Status: DC

## 2021-09-30 NOTE — Patient Instructions (Signed)
DRESSING CHANGES Right great toe/ right FOOT:        CLEANSE ULCER WITH SALINE.   DAB DRY WITH GAUZE SPONGE.   APPLY A LIGHT AMOUNT OF SANTYL TO BASE OF ULCER.   APPLY OUTER DRESSING/BAND-AID AS INSTRUCTED.   WEAR SURGICAL SHOE DAILY AT ALL TIMES.   DO NOT WALK BAREFOOT!!!   IF YOU EXPERIENCE ANY FEVER, CHILLS, NIGHTSWEATS, NAUSEA OR VOMITING, ELEVATED OR LOW BLOOD SUGARS, REPORT TO EMERGENCY ROOM.   IF YOU EXPERIENCE INCREASED REDNESS, PAIN, SWELLING, DISCOLORATION, ODOR, PUS, DRAINAGE OR WARMTH OF YOUR FOOT, REPORT TO EMERGENCY ROOM.

## 2021-09-30 NOTE — Progress Notes (Signed)
Chief Complaint  Patient presents with   Foot Ulcer       2WK WOUND CHECK    Vascular check HPI: Patient is 71 y.o. male who presents today for recheck of the right great toe ulcer. He relates the foot is doing better since he just got stents to improve the blood flow of the right foot.  He states now he has some swelling on the left foot ankle and lower leg and he wonders if it is from blood flow issues or general swelling.   No Known Allergies  Review of systems is negative except as noted in the HPI.  Denies nausea/ vomiting/ fevers/ chills or night sweats.   Denies difficulty breathing, denies calf pain or tenderness   General: Patient is awake, alert, oriented x 3 and in no acute distress.   Dermatology: Skin is warm and on the right great toe has a full thickness ulceration present distal tuft,  Ulceration measures 0.5cm x 0.7cm x 0.4cm depth. Appears improved from previous visit. Yellow slough is present as base.  The ulceration does not probe to bone. There is no malodor, no current active drainage, no erythema noted.    Right foot is warm to the touch-  improve perfusion to the foot and digits is noted.  No edema noted right foot.   Today 2+ edema to the left foot ankle and leg are noted.  It is pitting in nature.  No redness, or erythema noted.      Vascular: left foot is cooler in comparison to the right foot.  Capillary refill time is increased on the left in comparison to the right as well.  Pulses palpable right at 1/4 dp, pulses unable to be palpated left due to the edema   Neurologic: Protective sensation absent bilateral.  History of neuropathy.   Musculosketal: There is no pain with palpation to ulcerated area right hallux No calf pain. No signs of DVT.    Assessment:   ICD-10-CM   1. Ulcer of great toe, right, with fat layer exposed ([place])  L97.512     2. PAD (peripheral artery disease) (HCC)  I73.9     3. Edema, lower extremity  R60.0         Plan: Discussed exam findings.  His right foot and ulcer appear to be better from the last visit. I debrided the yellow slough to bleeding and viable tissue. I applied iodosorb and a dressing.  I ordered santyl for him to start using on the right great toe.    We discussed the swelling present on the left foot and leg.  Due to the temperature change and swelling I recommended he be seen at vascular again. I will make the referral but did tell him to go to the ER if the swelling becomes worse, he has any pain in the left leg or if there is any color or temperature change in the foot.    He will be seen back in our office in 2 weeks for a recheck of the ulcer on the right hallux.

## 2021-10-03 DIAGNOSIS — E875 Hyperkalemia: Secondary | ICD-10-CM | POA: Diagnosis not present

## 2021-10-03 DIAGNOSIS — Z992 Dependence on renal dialysis: Secondary | ICD-10-CM | POA: Diagnosis not present

## 2021-10-03 DIAGNOSIS — D509 Iron deficiency anemia, unspecified: Secondary | ICD-10-CM | POA: Diagnosis not present

## 2021-10-03 DIAGNOSIS — D631 Anemia in chronic kidney disease: Secondary | ICD-10-CM | POA: Diagnosis not present

## 2021-10-03 DIAGNOSIS — N2581 Secondary hyperparathyroidism of renal origin: Secondary | ICD-10-CM | POA: Diagnosis not present

## 2021-10-03 DIAGNOSIS — N186 End stage renal disease: Secondary | ICD-10-CM | POA: Diagnosis not present

## 2021-10-05 DIAGNOSIS — N186 End stage renal disease: Secondary | ICD-10-CM | POA: Diagnosis not present

## 2021-10-05 DIAGNOSIS — E875 Hyperkalemia: Secondary | ICD-10-CM | POA: Diagnosis not present

## 2021-10-05 DIAGNOSIS — Z992 Dependence on renal dialysis: Secondary | ICD-10-CM | POA: Diagnosis not present

## 2021-10-05 DIAGNOSIS — N2581 Secondary hyperparathyroidism of renal origin: Secondary | ICD-10-CM | POA: Diagnosis not present

## 2021-10-05 DIAGNOSIS — D631 Anemia in chronic kidney disease: Secondary | ICD-10-CM | POA: Diagnosis not present

## 2021-10-05 DIAGNOSIS — D509 Iron deficiency anemia, unspecified: Secondary | ICD-10-CM | POA: Diagnosis not present

## 2021-10-07 DIAGNOSIS — D631 Anemia in chronic kidney disease: Secondary | ICD-10-CM | POA: Diagnosis not present

## 2021-10-07 DIAGNOSIS — D509 Iron deficiency anemia, unspecified: Secondary | ICD-10-CM | POA: Diagnosis not present

## 2021-10-07 DIAGNOSIS — E875 Hyperkalemia: Secondary | ICD-10-CM | POA: Diagnosis not present

## 2021-10-07 DIAGNOSIS — N2581 Secondary hyperparathyroidism of renal origin: Secondary | ICD-10-CM | POA: Diagnosis not present

## 2021-10-07 DIAGNOSIS — Z992 Dependence on renal dialysis: Secondary | ICD-10-CM | POA: Diagnosis not present

## 2021-10-07 DIAGNOSIS — N186 End stage renal disease: Secondary | ICD-10-CM | POA: Diagnosis not present

## 2021-10-10 DIAGNOSIS — D631 Anemia in chronic kidney disease: Secondary | ICD-10-CM | POA: Diagnosis not present

## 2021-10-10 DIAGNOSIS — N186 End stage renal disease: Secondary | ICD-10-CM | POA: Diagnosis not present

## 2021-10-10 DIAGNOSIS — D509 Iron deficiency anemia, unspecified: Secondary | ICD-10-CM | POA: Diagnosis not present

## 2021-10-10 DIAGNOSIS — E875 Hyperkalemia: Secondary | ICD-10-CM | POA: Diagnosis not present

## 2021-10-10 DIAGNOSIS — N2581 Secondary hyperparathyroidism of renal origin: Secondary | ICD-10-CM | POA: Diagnosis not present

## 2021-10-10 DIAGNOSIS — Z992 Dependence on renal dialysis: Secondary | ICD-10-CM | POA: Diagnosis not present

## 2021-10-11 ENCOUNTER — Ambulatory Visit: Payer: Medicare Other | Admitting: Podiatrist

## 2021-10-12 DIAGNOSIS — N2581 Secondary hyperparathyroidism of renal origin: Secondary | ICD-10-CM | POA: Diagnosis not present

## 2021-10-12 DIAGNOSIS — D631 Anemia in chronic kidney disease: Secondary | ICD-10-CM | POA: Diagnosis not present

## 2021-10-12 DIAGNOSIS — N186 End stage renal disease: Secondary | ICD-10-CM | POA: Diagnosis not present

## 2021-10-12 DIAGNOSIS — Z992 Dependence on renal dialysis: Secondary | ICD-10-CM | POA: Diagnosis not present

## 2021-10-12 DIAGNOSIS — D509 Iron deficiency anemia, unspecified: Secondary | ICD-10-CM | POA: Diagnosis not present

## 2021-10-12 DIAGNOSIS — E875 Hyperkalemia: Secondary | ICD-10-CM | POA: Diagnosis not present

## 2021-10-14 DIAGNOSIS — Z992 Dependence on renal dialysis: Secondary | ICD-10-CM | POA: Diagnosis not present

## 2021-10-14 DIAGNOSIS — E875 Hyperkalemia: Secondary | ICD-10-CM | POA: Diagnosis not present

## 2021-10-14 DIAGNOSIS — D509 Iron deficiency anemia, unspecified: Secondary | ICD-10-CM | POA: Diagnosis not present

## 2021-10-14 DIAGNOSIS — N2581 Secondary hyperparathyroidism of renal origin: Secondary | ICD-10-CM | POA: Diagnosis not present

## 2021-10-14 DIAGNOSIS — D631 Anemia in chronic kidney disease: Secondary | ICD-10-CM | POA: Diagnosis not present

## 2021-10-14 DIAGNOSIS — N186 End stage renal disease: Secondary | ICD-10-CM | POA: Diagnosis not present

## 2021-10-17 DIAGNOSIS — Z992 Dependence on renal dialysis: Secondary | ICD-10-CM | POA: Diagnosis not present

## 2021-10-17 DIAGNOSIS — N2581 Secondary hyperparathyroidism of renal origin: Secondary | ICD-10-CM | POA: Diagnosis not present

## 2021-10-17 DIAGNOSIS — D509 Iron deficiency anemia, unspecified: Secondary | ICD-10-CM | POA: Diagnosis not present

## 2021-10-17 DIAGNOSIS — N186 End stage renal disease: Secondary | ICD-10-CM | POA: Diagnosis not present

## 2021-10-17 DIAGNOSIS — D631 Anemia in chronic kidney disease: Secondary | ICD-10-CM | POA: Diagnosis not present

## 2021-10-17 DIAGNOSIS — E875 Hyperkalemia: Secondary | ICD-10-CM | POA: Diagnosis not present

## 2021-10-18 ENCOUNTER — Ambulatory Visit (INDEPENDENT_AMBULATORY_CARE_PROVIDER_SITE_OTHER): Payer: Medicare Other

## 2021-10-18 ENCOUNTER — Encounter: Payer: Self-pay | Admitting: Podiatrist

## 2021-10-18 ENCOUNTER — Ambulatory Visit: Payer: Medicare Other | Admitting: Podiatrist

## 2021-10-18 DIAGNOSIS — L97512 Non-pressure chronic ulcer of other part of right foot with fat layer exposed: Secondary | ICD-10-CM

## 2021-10-18 DIAGNOSIS — L97514 Non-pressure chronic ulcer of other part of right foot with necrosis of bone: Secondary | ICD-10-CM
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
## 2021-10-19 DIAGNOSIS — E875 Hyperkalemia: Secondary | ICD-10-CM | POA: Diagnosis not present

## 2021-10-19 DIAGNOSIS — N2581 Secondary hyperparathyroidism of renal origin: Secondary | ICD-10-CM | POA: Diagnosis not present

## 2021-10-19 DIAGNOSIS — D509 Iron deficiency anemia, unspecified: Secondary | ICD-10-CM | POA: Diagnosis not present

## 2021-10-19 DIAGNOSIS — N186 End stage renal disease: Secondary | ICD-10-CM | POA: Diagnosis not present

## 2021-10-19 DIAGNOSIS — D631 Anemia in chronic kidney disease: Secondary | ICD-10-CM | POA: Diagnosis not present

## 2021-10-19 DIAGNOSIS — Z992 Dependence on renal dialysis: Secondary | ICD-10-CM | POA: Diagnosis not present

## 2021-10-20 ENCOUNTER — Ambulatory Visit: Payer: Medicare Other | Admitting: Podiatry

## 2021-10-21 DIAGNOSIS — D509 Iron deficiency anemia, unspecified: Secondary | ICD-10-CM | POA: Diagnosis not present

## 2021-10-21 DIAGNOSIS — Z992 Dependence on renal dialysis: Secondary | ICD-10-CM | POA: Diagnosis not present

## 2021-10-21 DIAGNOSIS — E875 Hyperkalemia: Secondary | ICD-10-CM | POA: Diagnosis not present

## 2021-10-21 DIAGNOSIS — N2581 Secondary hyperparathyroidism of renal origin: Secondary | ICD-10-CM | POA: Diagnosis not present

## 2021-10-21 DIAGNOSIS — N186 End stage renal disease: Secondary | ICD-10-CM | POA: Diagnosis not present

## 2021-10-21 DIAGNOSIS — E1122 Type 2 diabetes mellitus with diabetic chronic kidney disease: Secondary | ICD-10-CM | POA: Diagnosis not present

## 2021-10-21 DIAGNOSIS — D631 Anemia in chronic kidney disease: Secondary | ICD-10-CM | POA: Diagnosis not present

## 2021-10-24 DIAGNOSIS — E1129 Type 2 diabetes mellitus with other diabetic kidney complication: Secondary | ICD-10-CM | POA: Diagnosis not present

## 2021-10-24 DIAGNOSIS — E875 Hyperkalemia: Secondary | ICD-10-CM | POA: Diagnosis not present

## 2021-10-24 DIAGNOSIS — Z992 Dependence on renal dialysis: Secondary | ICD-10-CM | POA: Diagnosis not present

## 2021-10-24 DIAGNOSIS — N186 End stage renal disease: Secondary | ICD-10-CM | POA: Diagnosis not present

## 2021-10-24 DIAGNOSIS — D509 Iron deficiency anemia, unspecified: Secondary | ICD-10-CM | POA: Diagnosis not present

## 2021-10-24 DIAGNOSIS — N2581 Secondary hyperparathyroidism of renal origin: Secondary | ICD-10-CM | POA: Diagnosis not present

## 2021-10-24 DIAGNOSIS — D631 Anemia in chronic kidney disease: Secondary | ICD-10-CM | POA: Diagnosis not present

## 2021-10-26 DIAGNOSIS — N2581 Secondary hyperparathyroidism of renal origin: Secondary | ICD-10-CM | POA: Diagnosis not present

## 2021-10-26 DIAGNOSIS — N186 End stage renal disease: Secondary | ICD-10-CM | POA: Diagnosis not present

## 2021-10-26 DIAGNOSIS — Z992 Dependence on renal dialysis: Secondary | ICD-10-CM | POA: Diagnosis not present

## 2021-10-26 DIAGNOSIS — D509 Iron deficiency anemia, unspecified: Secondary | ICD-10-CM | POA: Diagnosis not present

## 2021-10-26 DIAGNOSIS — D631 Anemia in chronic kidney disease: Secondary | ICD-10-CM | POA: Diagnosis not present

## 2021-10-26 DIAGNOSIS — E1129 Type 2 diabetes mellitus with other diabetic kidney complication: Secondary | ICD-10-CM | POA: Diagnosis not present

## 2021-10-26 DIAGNOSIS — E875 Hyperkalemia: Secondary | ICD-10-CM | POA: Diagnosis not present

## 2021-10-28 DIAGNOSIS — N186 End stage renal disease: Secondary | ICD-10-CM | POA: Diagnosis not present

## 2021-10-28 DIAGNOSIS — D509 Iron deficiency anemia, unspecified: Secondary | ICD-10-CM | POA: Diagnosis not present

## 2021-10-28 DIAGNOSIS — D631 Anemia in chronic kidney disease: Secondary | ICD-10-CM | POA: Diagnosis not present

## 2021-10-28 DIAGNOSIS — Z992 Dependence on renal dialysis: Secondary | ICD-10-CM | POA: Diagnosis not present

## 2021-10-28 DIAGNOSIS — E1129 Type 2 diabetes mellitus with other diabetic kidney complication: Secondary | ICD-10-CM | POA: Diagnosis not present

## 2021-10-28 DIAGNOSIS — R809 Proteinuria, unspecified: Secondary | ICD-10-CM | POA: Diagnosis not present

## 2021-10-28 DIAGNOSIS — E781 Pure hyperglyceridemia: Secondary | ICD-10-CM | POA: Diagnosis not present

## 2021-10-28 DIAGNOSIS — N2581 Secondary hyperparathyroidism of renal origin: Secondary | ICD-10-CM | POA: Diagnosis not present

## 2021-10-28 DIAGNOSIS — M545 Low back pain, unspecified: Secondary | ICD-10-CM | POA: Diagnosis not present

## 2021-10-28 DIAGNOSIS — G8929 Other chronic pain: Secondary | ICD-10-CM | POA: Diagnosis not present

## 2021-10-28 DIAGNOSIS — G4700 Insomnia, unspecified: Secondary | ICD-10-CM | POA: Diagnosis not present

## 2021-10-28 DIAGNOSIS — E785 Hyperlipidemia, unspecified: Secondary | ICD-10-CM | POA: Diagnosis not present

## 2021-10-28 DIAGNOSIS — I10 Essential (primary) hypertension: Secondary | ICD-10-CM | POA: Diagnosis not present

## 2021-10-28 DIAGNOSIS — K219 Gastro-esophageal reflux disease without esophagitis: Secondary | ICD-10-CM | POA: Diagnosis not present

## 2021-10-28 DIAGNOSIS — E875 Hyperkalemia: Secondary | ICD-10-CM | POA: Diagnosis not present

## 2021-10-28 DIAGNOSIS — E11319 Type 2 diabetes mellitus with unspecified diabetic retinopathy without macular edema: Secondary | ICD-10-CM | POA: Diagnosis not present

## 2021-10-31 DIAGNOSIS — D509 Iron deficiency anemia, unspecified: Secondary | ICD-10-CM | POA: Diagnosis not present

## 2021-10-31 DIAGNOSIS — E1129 Type 2 diabetes mellitus with other diabetic kidney complication: Secondary | ICD-10-CM | POA: Diagnosis not present

## 2021-10-31 DIAGNOSIS — N186 End stage renal disease: Secondary | ICD-10-CM | POA: Diagnosis not present

## 2021-10-31 DIAGNOSIS — Z992 Dependence on renal dialysis: Secondary | ICD-10-CM | POA: Diagnosis not present

## 2021-10-31 DIAGNOSIS — D631 Anemia in chronic kidney disease: Secondary | ICD-10-CM | POA: Diagnosis not present

## 2021-10-31 DIAGNOSIS — E875 Hyperkalemia: Secondary | ICD-10-CM | POA: Diagnosis not present

## 2021-10-31 DIAGNOSIS — N2581 Secondary hyperparathyroidism of renal origin: Secondary | ICD-10-CM | POA: Diagnosis not present

## 2021-11-02 DIAGNOSIS — E1129 Type 2 diabetes mellitus with other diabetic kidney complication: Secondary | ICD-10-CM | POA: Diagnosis not present

## 2021-11-02 DIAGNOSIS — N186 End stage renal disease: Secondary | ICD-10-CM | POA: Diagnosis not present

## 2021-11-02 DIAGNOSIS — D509 Iron deficiency anemia, unspecified: Secondary | ICD-10-CM | POA: Diagnosis not present

## 2021-11-02 DIAGNOSIS — E875 Hyperkalemia: Secondary | ICD-10-CM | POA: Diagnosis not present

## 2021-11-02 DIAGNOSIS — N2581 Secondary hyperparathyroidism of renal origin: Secondary | ICD-10-CM | POA: Diagnosis not present

## 2021-11-02 DIAGNOSIS — D631 Anemia in chronic kidney disease: Secondary | ICD-10-CM | POA: Diagnosis not present

## 2021-11-02 DIAGNOSIS — Z992 Dependence on renal dialysis: Secondary | ICD-10-CM | POA: Diagnosis not present

## 2021-11-04 DIAGNOSIS — E875 Hyperkalemia: Secondary | ICD-10-CM | POA: Diagnosis not present

## 2021-11-04 DIAGNOSIS — D631 Anemia in chronic kidney disease: Secondary | ICD-10-CM | POA: Diagnosis not present

## 2021-11-04 DIAGNOSIS — D509 Iron deficiency anemia, unspecified: Secondary | ICD-10-CM | POA: Diagnosis not present

## 2021-11-04 DIAGNOSIS — Z992 Dependence on renal dialysis: Secondary | ICD-10-CM | POA: Diagnosis not present

## 2021-11-04 DIAGNOSIS — E1129 Type 2 diabetes mellitus with other diabetic kidney complication: Secondary | ICD-10-CM | POA: Diagnosis not present

## 2021-11-04 DIAGNOSIS — N2581 Secondary hyperparathyroidism of renal origin: Secondary | ICD-10-CM | POA: Diagnosis not present

## 2021-11-04 DIAGNOSIS — N186 End stage renal disease: Secondary | ICD-10-CM | POA: Diagnosis not present

## 2021-11-07 DIAGNOSIS — N186 End stage renal disease: Secondary | ICD-10-CM | POA: Diagnosis not present

## 2021-11-07 DIAGNOSIS — E875 Hyperkalemia: Secondary | ICD-10-CM | POA: Diagnosis not present

## 2021-11-07 DIAGNOSIS — N2581 Secondary hyperparathyroidism of renal origin: Secondary | ICD-10-CM | POA: Diagnosis not present

## 2021-11-07 DIAGNOSIS — D631 Anemia in chronic kidney disease: Secondary | ICD-10-CM | POA: Diagnosis not present

## 2021-11-07 DIAGNOSIS — D509 Iron deficiency anemia, unspecified: Secondary | ICD-10-CM | POA: Diagnosis not present

## 2021-11-07 DIAGNOSIS — Z992 Dependence on renal dialysis: Secondary | ICD-10-CM | POA: Diagnosis not present

## 2021-11-07 DIAGNOSIS — E1129 Type 2 diabetes mellitus with other diabetic kidney complication: Secondary | ICD-10-CM | POA: Diagnosis not present

## 2021-11-08 ENCOUNTER — Ambulatory Visit: Payer: Medicare Other | Admitting: Podiatrist

## 2021-11-09 DIAGNOSIS — N2581 Secondary hyperparathyroidism of renal origin: Secondary | ICD-10-CM | POA: Diagnosis not present

## 2021-11-09 DIAGNOSIS — E1129 Type 2 diabetes mellitus with other diabetic kidney complication: Secondary | ICD-10-CM | POA: Diagnosis not present

## 2021-11-09 DIAGNOSIS — D509 Iron deficiency anemia, unspecified: Secondary | ICD-10-CM | POA: Diagnosis not present

## 2021-11-09 DIAGNOSIS — N186 End stage renal disease: Secondary | ICD-10-CM | POA: Diagnosis not present

## 2021-11-09 DIAGNOSIS — E875 Hyperkalemia: Secondary | ICD-10-CM | POA: Diagnosis not present

## 2021-11-09 DIAGNOSIS — D631 Anemia in chronic kidney disease: Secondary | ICD-10-CM | POA: Diagnosis not present

## 2021-11-09 DIAGNOSIS — Z992 Dependence on renal dialysis: Secondary | ICD-10-CM | POA: Diagnosis not present

## 2021-11-11 DIAGNOSIS — E875 Hyperkalemia: Secondary | ICD-10-CM | POA: Diagnosis not present

## 2021-11-11 DIAGNOSIS — E1129 Type 2 diabetes mellitus with other diabetic kidney complication: Secondary | ICD-10-CM | POA: Diagnosis not present

## 2021-11-11 DIAGNOSIS — N2581 Secondary hyperparathyroidism of renal origin: Secondary | ICD-10-CM | POA: Diagnosis not present

## 2021-11-11 DIAGNOSIS — D509 Iron deficiency anemia, unspecified: Secondary | ICD-10-CM | POA: Diagnosis not present

## 2021-11-11 DIAGNOSIS — D631 Anemia in chronic kidney disease: Secondary | ICD-10-CM | POA: Diagnosis not present

## 2021-11-11 DIAGNOSIS — Z992 Dependence on renal dialysis: Secondary | ICD-10-CM | POA: Diagnosis not present

## 2021-11-11 DIAGNOSIS — N186 End stage renal disease: Secondary | ICD-10-CM | POA: Diagnosis not present

## 2021-11-14 ENCOUNTER — Encounter: Payer: Self-pay | Admitting: Podiatry

## 2021-11-14 ENCOUNTER — Ambulatory Visit: Payer: Medicare Other | Admitting: Podiatry

## 2021-11-14 DIAGNOSIS — L97514 Non-pressure chronic ulcer of other part of right foot with necrosis of bone: Secondary | ICD-10-CM | POA: Diagnosis not present

## 2021-11-14 DIAGNOSIS — I739 Peripheral vascular disease, unspecified: Secondary | ICD-10-CM

## 2021-11-14 DIAGNOSIS — N2581 Secondary hyperparathyroidism of renal origin: Secondary | ICD-10-CM | POA: Diagnosis not present

## 2021-11-14 DIAGNOSIS — Z992 Dependence on renal dialysis: Secondary | ICD-10-CM | POA: Diagnosis not present

## 2021-11-14 DIAGNOSIS — E1142 Type 2 diabetes mellitus with diabetic polyneuropathy: Secondary | ICD-10-CM | POA: Diagnosis not present

## 2021-11-14 DIAGNOSIS — E875 Hyperkalemia: Secondary | ICD-10-CM | POA: Diagnosis not present

## 2021-11-14 DIAGNOSIS — E1129 Type 2 diabetes mellitus with other diabetic kidney complication: Secondary | ICD-10-CM | POA: Diagnosis not present

## 2021-11-14 DIAGNOSIS — N186 End stage renal disease: Secondary | ICD-10-CM | POA: Diagnosis not present

## 2021-11-14 DIAGNOSIS — D509 Iron deficiency anemia, unspecified: Secondary | ICD-10-CM | POA: Diagnosis not present

## 2021-11-14 DIAGNOSIS — D631 Anemia in chronic kidney disease: Secondary | ICD-10-CM | POA: Diagnosis not present
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
## 2021-11-16 DIAGNOSIS — E1129 Type 2 diabetes mellitus with other diabetic kidney complication: Secondary | ICD-10-CM | POA: Diagnosis not present

## 2021-11-16 DIAGNOSIS — N2581 Secondary hyperparathyroidism of renal origin: Secondary | ICD-10-CM | POA: Diagnosis not present

## 2021-11-16 DIAGNOSIS — E875 Hyperkalemia: Secondary | ICD-10-CM | POA: Diagnosis not present

## 2021-11-16 DIAGNOSIS — Z992 Dependence on renal dialysis: Secondary | ICD-10-CM | POA: Diagnosis not present

## 2021-11-16 DIAGNOSIS — N186 End stage renal disease: Secondary | ICD-10-CM | POA: Diagnosis not present

## 2021-11-16 DIAGNOSIS — D631 Anemia in chronic kidney disease: Secondary | ICD-10-CM | POA: Diagnosis not present

## 2021-11-16 DIAGNOSIS — D509 Iron deficiency anemia, unspecified: Secondary | ICD-10-CM | POA: Diagnosis not present

## 2021-11-18 DIAGNOSIS — E1129 Type 2 diabetes mellitus with other diabetic kidney complication: Secondary | ICD-10-CM | POA: Diagnosis not present

## 2021-11-18 DIAGNOSIS — D509 Iron deficiency anemia, unspecified: Secondary | ICD-10-CM | POA: Diagnosis not present

## 2021-11-18 DIAGNOSIS — N2581 Secondary hyperparathyroidism of renal origin: Secondary | ICD-10-CM | POA: Diagnosis not present

## 2021-11-18 DIAGNOSIS — D631 Anemia in chronic kidney disease: Secondary | ICD-10-CM | POA: Diagnosis not present

## 2021-11-18 DIAGNOSIS — N186 End stage renal disease: Secondary | ICD-10-CM | POA: Diagnosis not present

## 2021-11-18 DIAGNOSIS — E875 Hyperkalemia: Secondary | ICD-10-CM | POA: Diagnosis not present

## 2021-11-18 DIAGNOSIS — Z992 Dependence on renal dialysis: Secondary | ICD-10-CM | POA: Diagnosis not present

## 2021-11-21 DIAGNOSIS — D631 Anemia in chronic kidney disease: Secondary | ICD-10-CM | POA: Diagnosis not present

## 2021-11-21 DIAGNOSIS — N186 End stage renal disease: Secondary | ICD-10-CM | POA: Diagnosis not present

## 2021-11-21 DIAGNOSIS — D509 Iron deficiency anemia, unspecified: Secondary | ICD-10-CM | POA: Diagnosis not present

## 2021-11-21 DIAGNOSIS — E1122 Type 2 diabetes mellitus with diabetic chronic kidney disease: Secondary | ICD-10-CM | POA: Diagnosis not present

## 2021-11-21 DIAGNOSIS — Z992 Dependence on renal dialysis: Secondary | ICD-10-CM | POA: Diagnosis not present

## 2021-11-21 DIAGNOSIS — E875 Hyperkalemia: Secondary | ICD-10-CM | POA: Diagnosis not present

## 2021-11-21 DIAGNOSIS — E1129 Type 2 diabetes mellitus with other diabetic kidney complication: Secondary | ICD-10-CM | POA: Diagnosis not present

## 2021-11-21 DIAGNOSIS — N2581 Secondary hyperparathyroidism of renal origin: Secondary | ICD-10-CM | POA: Diagnosis not present

## 2021-11-23 DIAGNOSIS — N2581 Secondary hyperparathyroidism of renal origin: Secondary | ICD-10-CM | POA: Diagnosis not present

## 2021-11-23 DIAGNOSIS — N186 End stage renal disease: Secondary | ICD-10-CM | POA: Diagnosis not present

## 2021-11-23 DIAGNOSIS — D631 Anemia in chronic kidney disease: Secondary | ICD-10-CM | POA: Diagnosis not present

## 2021-11-23 DIAGNOSIS — D509 Iron deficiency anemia, unspecified: Secondary | ICD-10-CM | POA: Diagnosis not present

## 2021-11-23 DIAGNOSIS — E875 Hyperkalemia: Secondary | ICD-10-CM | POA: Diagnosis not present

## 2021-11-23 DIAGNOSIS — Z992 Dependence on renal dialysis: Secondary | ICD-10-CM | POA: Diagnosis not present

## 2021-11-25 DIAGNOSIS — D509 Iron deficiency anemia, unspecified: Secondary | ICD-10-CM | POA: Diagnosis not present

## 2021-11-25 DIAGNOSIS — N2581 Secondary hyperparathyroidism of renal origin: Secondary | ICD-10-CM | POA: Diagnosis not present

## 2021-11-25 DIAGNOSIS — D631 Anemia in chronic kidney disease: Secondary | ICD-10-CM | POA: Diagnosis not present

## 2021-11-25 DIAGNOSIS — Z992 Dependence on renal dialysis: Secondary | ICD-10-CM | POA: Diagnosis not present

## 2021-11-25 DIAGNOSIS — E875 Hyperkalemia: Secondary | ICD-10-CM | POA: Diagnosis not present

## 2021-11-25 DIAGNOSIS — N186 End stage renal disease: Secondary | ICD-10-CM | POA: Diagnosis not present

## 2021-11-28 ENCOUNTER — Ambulatory Visit: Payer: Medicare Other | Admitting: Podiatry

## 2021-11-28 DIAGNOSIS — N186 End stage renal disease: Secondary | ICD-10-CM | POA: Diagnosis not present

## 2021-11-28 DIAGNOSIS — E875 Hyperkalemia: Secondary | ICD-10-CM | POA: Diagnosis not present

## 2021-11-28 DIAGNOSIS — Z992 Dependence on renal dialysis: Secondary | ICD-10-CM | POA: Diagnosis not present

## 2021-11-28 DIAGNOSIS — N2581 Secondary hyperparathyroidism of renal origin: Secondary | ICD-10-CM | POA: Diagnosis not present

## 2021-11-28 DIAGNOSIS — D509 Iron deficiency anemia, unspecified: Secondary | ICD-10-CM | POA: Diagnosis not present

## 2021-11-28 DIAGNOSIS — D631 Anemia in chronic kidney disease: Secondary | ICD-10-CM | POA: Diagnosis not present

## 2021-11-29 DIAGNOSIS — M545 Low back pain, unspecified: Secondary | ICD-10-CM | POA: Diagnosis not present

## 2021-11-29 DIAGNOSIS — I10 Essential (primary) hypertension: Secondary | ICD-10-CM | POA: Diagnosis not present

## 2021-11-29 DIAGNOSIS — E781 Pure hyperglyceridemia: Secondary | ICD-10-CM | POA: Diagnosis not present

## 2021-11-29 DIAGNOSIS — E1129 Type 2 diabetes mellitus with other diabetic kidney complication: Secondary | ICD-10-CM | POA: Diagnosis not present

## 2021-11-29 DIAGNOSIS — G4700 Insomnia, unspecified: Secondary | ICD-10-CM | POA: Diagnosis not present

## 2021-11-29 DIAGNOSIS — Z6823 Body mass index (BMI) 23.0-23.9, adult: Secondary | ICD-10-CM | POA: Diagnosis not present

## 2021-11-29 DIAGNOSIS — E11319 Type 2 diabetes mellitus with unspecified diabetic retinopathy without macular edema: Secondary | ICD-10-CM | POA: Diagnosis not present

## 2021-11-29 DIAGNOSIS — E785 Hyperlipidemia, unspecified: Secondary | ICD-10-CM | POA: Diagnosis not present

## 2021-11-29 DIAGNOSIS — K219 Gastro-esophageal reflux disease without esophagitis: Secondary | ICD-10-CM | POA: Diagnosis not present

## 2021-11-29 DIAGNOSIS — G8929 Other chronic pain: Secondary | ICD-10-CM | POA: Diagnosis not present

## 2021-11-30 DIAGNOSIS — D509 Iron deficiency anemia, unspecified: Secondary | ICD-10-CM | POA: Diagnosis not present

## 2021-11-30 DIAGNOSIS — Z992 Dependence on renal dialysis: Secondary | ICD-10-CM | POA: Diagnosis not present

## 2021-11-30 DIAGNOSIS — N2581 Secondary hyperparathyroidism of renal origin: Secondary | ICD-10-CM | POA: Diagnosis not present

## 2021-11-30 DIAGNOSIS — D631 Anemia in chronic kidney disease: Secondary | ICD-10-CM | POA: Diagnosis not present

## 2021-11-30 DIAGNOSIS — N186 End stage renal disease: Secondary | ICD-10-CM | POA: Diagnosis not present

## 2021-11-30 DIAGNOSIS — E875 Hyperkalemia: Secondary | ICD-10-CM | POA: Diagnosis not present

## 2021-12-01 ENCOUNTER — Ambulatory Visit: Payer: Medicare Other | Admitting: Internal Medicine

## 2021-12-02 DIAGNOSIS — N2581 Secondary hyperparathyroidism of renal origin: Secondary | ICD-10-CM | POA: Diagnosis not present

## 2021-12-02 DIAGNOSIS — D509 Iron deficiency anemia, unspecified: Secondary | ICD-10-CM | POA: Diagnosis not present

## 2021-12-02 DIAGNOSIS — D631 Anemia in chronic kidney disease: Secondary | ICD-10-CM | POA: Diagnosis not present

## 2021-12-02 DIAGNOSIS — Z992 Dependence on renal dialysis: Secondary | ICD-10-CM | POA: Diagnosis not present

## 2021-12-02 DIAGNOSIS — E875 Hyperkalemia: Secondary | ICD-10-CM | POA: Diagnosis not present

## 2021-12-02 DIAGNOSIS — N186 End stage renal disease: Secondary | ICD-10-CM | POA: Diagnosis not present

## 2021-12-05 DIAGNOSIS — D631 Anemia in chronic kidney disease: Secondary | ICD-10-CM | POA: Diagnosis not present

## 2021-12-05 DIAGNOSIS — Z992 Dependence on renal dialysis: Secondary | ICD-10-CM | POA: Diagnosis not present

## 2021-12-05 DIAGNOSIS — E875 Hyperkalemia: Secondary | ICD-10-CM | POA: Diagnosis not present

## 2021-12-05 DIAGNOSIS — D509 Iron deficiency anemia, unspecified: Secondary | ICD-10-CM | POA: Diagnosis not present

## 2021-12-05 DIAGNOSIS — N186 End stage renal disease: Secondary | ICD-10-CM | POA: Diagnosis not present

## 2021-12-05 DIAGNOSIS — N2581 Secondary hyperparathyroidism of renal origin: Secondary | ICD-10-CM | POA: Diagnosis not present

## 2021-12-07 DIAGNOSIS — N186 End stage renal disease: Secondary | ICD-10-CM | POA: Diagnosis not present

## 2021-12-07 DIAGNOSIS — D509 Iron deficiency anemia, unspecified: Secondary | ICD-10-CM | POA: Diagnosis not present

## 2021-12-07 DIAGNOSIS — D631 Anemia in chronic kidney disease: Secondary | ICD-10-CM | POA: Diagnosis not present

## 2021-12-07 DIAGNOSIS — N2581 Secondary hyperparathyroidism of renal origin: Secondary | ICD-10-CM | POA: Diagnosis not present

## 2021-12-07 DIAGNOSIS — E875 Hyperkalemia: Secondary | ICD-10-CM | POA: Diagnosis not present

## 2021-12-07 DIAGNOSIS — Z992 Dependence on renal dialysis: Secondary | ICD-10-CM | POA: Diagnosis not present

## 2021-12-09 DIAGNOSIS — D509 Iron deficiency anemia, unspecified: Secondary | ICD-10-CM | POA: Diagnosis not present

## 2021-12-09 DIAGNOSIS — E875 Hyperkalemia: Secondary | ICD-10-CM | POA: Diagnosis not present

## 2021-12-09 DIAGNOSIS — N2581 Secondary hyperparathyroidism of renal origin: Secondary | ICD-10-CM | POA: Diagnosis not present

## 2021-12-09 DIAGNOSIS — Z992 Dependence on renal dialysis: Secondary | ICD-10-CM | POA: Diagnosis not present

## 2021-12-09 DIAGNOSIS — D631 Anemia in chronic kidney disease: Secondary | ICD-10-CM | POA: Diagnosis not present

## 2021-12-09 DIAGNOSIS — N186 End stage renal disease: Secondary | ICD-10-CM | POA: Diagnosis not present

## 2021-12-12 ENCOUNTER — Telehealth: Payer: Self-pay

## 2021-12-12 DIAGNOSIS — N2581 Secondary hyperparathyroidism of renal origin: Secondary | ICD-10-CM | POA: Diagnosis not present

## 2021-12-12 DIAGNOSIS — D509 Iron deficiency anemia, unspecified: Secondary | ICD-10-CM | POA: Diagnosis not present

## 2021-12-12 DIAGNOSIS — E875 Hyperkalemia: Secondary | ICD-10-CM | POA: Diagnosis not present

## 2021-12-12 DIAGNOSIS — N186 End stage renal disease: Secondary | ICD-10-CM | POA: Diagnosis not present

## 2021-12-12 DIAGNOSIS — Z992 Dependence on renal dialysis: Secondary | ICD-10-CM | POA: Diagnosis not present

## 2021-12-12 DIAGNOSIS — D631 Anemia in chronic kidney disease: Secondary | ICD-10-CM | POA: Diagnosis not present

## 2021-12-12 NOTE — Telephone Encounter (Signed)
Pt called, f/u appt scheduled from referral. Confirmed understanding.

## 2021-12-14 DIAGNOSIS — E875 Hyperkalemia: Secondary | ICD-10-CM | POA: Diagnosis not present

## 2021-12-14 DIAGNOSIS — Z992 Dependence on renal dialysis: Secondary | ICD-10-CM | POA: Diagnosis not present

## 2021-12-14 DIAGNOSIS — D631 Anemia in chronic kidney disease: Secondary | ICD-10-CM | POA: Diagnosis not present

## 2021-12-14 DIAGNOSIS — N186 End stage renal disease: Secondary | ICD-10-CM | POA: Diagnosis not present

## 2021-12-14 DIAGNOSIS — D509 Iron deficiency anemia, unspecified: Secondary | ICD-10-CM | POA: Diagnosis not present

## 2021-12-14 DIAGNOSIS — N2581 Secondary hyperparathyroidism of renal origin: Secondary | ICD-10-CM | POA: Diagnosis not present

## 2021-12-16 DIAGNOSIS — N2581 Secondary hyperparathyroidism of renal origin: Secondary | ICD-10-CM | POA: Diagnosis not present

## 2021-12-16 DIAGNOSIS — Z992 Dependence on renal dialysis: Secondary | ICD-10-CM | POA: Diagnosis not present

## 2021-12-16 DIAGNOSIS — D631 Anemia in chronic kidney disease: Secondary | ICD-10-CM | POA: Diagnosis not present

## 2021-12-16 DIAGNOSIS — D509 Iron deficiency anemia, unspecified: Secondary | ICD-10-CM | POA: Diagnosis not present

## 2021-12-16 DIAGNOSIS — E875 Hyperkalemia: Secondary | ICD-10-CM | POA: Diagnosis not present

## 2021-12-16 DIAGNOSIS — N186 End stage renal disease: Secondary | ICD-10-CM | POA: Diagnosis not present

## 2021-12-19 DIAGNOSIS — D509 Iron deficiency anemia, unspecified: Secondary | ICD-10-CM | POA: Diagnosis not present

## 2021-12-19 DIAGNOSIS — N186 End stage renal disease: Secondary | ICD-10-CM | POA: Diagnosis not present

## 2021-12-19 DIAGNOSIS — E875 Hyperkalemia: Secondary | ICD-10-CM | POA: Diagnosis not present

## 2021-12-19 DIAGNOSIS — N2581 Secondary hyperparathyroidism of renal origin: Secondary | ICD-10-CM | POA: Diagnosis not present

## 2021-12-19 DIAGNOSIS — D631 Anemia in chronic kidney disease: Secondary | ICD-10-CM | POA: Diagnosis not present

## 2021-12-19 DIAGNOSIS — Z992 Dependence on renal dialysis: Secondary | ICD-10-CM | POA: Diagnosis not present

## 2021-12-21 DIAGNOSIS — D509 Iron deficiency anemia, unspecified: Secondary | ICD-10-CM | POA: Diagnosis not present

## 2021-12-21 DIAGNOSIS — E875 Hyperkalemia: Secondary | ICD-10-CM | POA: Diagnosis not present

## 2021-12-21 DIAGNOSIS — N2581 Secondary hyperparathyroidism of renal origin: Secondary | ICD-10-CM | POA: Diagnosis not present

## 2021-12-21 DIAGNOSIS — Z992 Dependence on renal dialysis: Secondary | ICD-10-CM | POA: Diagnosis not present

## 2021-12-21 DIAGNOSIS — N186 End stage renal disease: Secondary | ICD-10-CM | POA: Diagnosis not present

## 2021-12-21 DIAGNOSIS — D631 Anemia in chronic kidney disease: Secondary | ICD-10-CM | POA: Diagnosis not present

## 2021-12-22 DIAGNOSIS — Z992 Dependence on renal dialysis: Secondary | ICD-10-CM | POA: Diagnosis not present

## 2021-12-22 DIAGNOSIS — N186 End stage renal disease: Secondary | ICD-10-CM | POA: Diagnosis not present

## 2021-12-22 DIAGNOSIS — E1122 Type 2 diabetes mellitus with diabetic chronic kidney disease: Secondary | ICD-10-CM | POA: Diagnosis not present

## 2021-12-23 DIAGNOSIS — N186 End stage renal disease: Secondary | ICD-10-CM | POA: Diagnosis not present

## 2021-12-23 DIAGNOSIS — Z992 Dependence on renal dialysis: Secondary | ICD-10-CM | POA: Diagnosis not present

## 2021-12-23 DIAGNOSIS — D509 Iron deficiency anemia, unspecified: Secondary | ICD-10-CM | POA: Diagnosis not present

## 2021-12-23 DIAGNOSIS — E875 Hyperkalemia: Secondary | ICD-10-CM | POA: Diagnosis not present

## 2021-12-23 DIAGNOSIS — D631 Anemia in chronic kidney disease: Secondary | ICD-10-CM | POA: Diagnosis not present

## 2021-12-23 DIAGNOSIS — N2581 Secondary hyperparathyroidism of renal origin: Secondary | ICD-10-CM | POA: Diagnosis not present

## 2021-12-26 DIAGNOSIS — D631 Anemia in chronic kidney disease: Secondary | ICD-10-CM | POA: Diagnosis not present

## 2021-12-26 DIAGNOSIS — N186 End stage renal disease: Secondary | ICD-10-CM | POA: Diagnosis not present

## 2021-12-26 DIAGNOSIS — Z992 Dependence on renal dialysis: Secondary | ICD-10-CM | POA: Diagnosis not present

## 2021-12-26 DIAGNOSIS — E875 Hyperkalemia: Secondary | ICD-10-CM | POA: Diagnosis not present

## 2021-12-26 DIAGNOSIS — D509 Iron deficiency anemia, unspecified: Secondary | ICD-10-CM | POA: Diagnosis not present

## 2021-12-26 DIAGNOSIS — N2581 Secondary hyperparathyroidism of renal origin: Secondary | ICD-10-CM | POA: Diagnosis not present

## 2021-12-28 DIAGNOSIS — E875 Hyperkalemia: Secondary | ICD-10-CM | POA: Diagnosis not present

## 2021-12-28 DIAGNOSIS — N2581 Secondary hyperparathyroidism of renal origin: Secondary | ICD-10-CM | POA: Diagnosis not present

## 2021-12-28 DIAGNOSIS — D509 Iron deficiency anemia, unspecified: Secondary | ICD-10-CM | POA: Diagnosis not present

## 2021-12-28 DIAGNOSIS — N186 End stage renal disease: Secondary | ICD-10-CM | POA: Diagnosis not present

## 2021-12-28 DIAGNOSIS — D631 Anemia in chronic kidney disease: Secondary | ICD-10-CM | POA: Diagnosis not present

## 2021-12-28 DIAGNOSIS — Z992 Dependence on renal dialysis: Secondary | ICD-10-CM | POA: Diagnosis not present

## 2021-12-30 DIAGNOSIS — N2581 Secondary hyperparathyroidism of renal origin: Secondary | ICD-10-CM | POA: Diagnosis not present

## 2021-12-30 DIAGNOSIS — R5382 Chronic fatigue, unspecified: Secondary | ICD-10-CM | POA: Diagnosis not present

## 2021-12-30 DIAGNOSIS — I10 Essential (primary) hypertension: Secondary | ICD-10-CM | POA: Diagnosis not present

## 2021-12-30 DIAGNOSIS — E875 Hyperkalemia: Secondary | ICD-10-CM | POA: Diagnosis not present

## 2021-12-30 DIAGNOSIS — Z992 Dependence on renal dialysis: Secondary | ICD-10-CM | POA: Diagnosis not present

## 2021-12-30 DIAGNOSIS — D509 Iron deficiency anemia, unspecified: Secondary | ICD-10-CM | POA: Diagnosis not present

## 2021-12-30 DIAGNOSIS — E785 Hyperlipidemia, unspecified: Secondary | ICD-10-CM | POA: Diagnosis not present

## 2021-12-30 DIAGNOSIS — G4700 Insomnia, unspecified: Secondary | ICD-10-CM | POA: Diagnosis not present

## 2021-12-30 DIAGNOSIS — E039 Hypothyroidism, unspecified: Secondary | ICD-10-CM | POA: Diagnosis not present

## 2021-12-30 DIAGNOSIS — N186 End stage renal disease: Secondary | ICD-10-CM | POA: Diagnosis not present

## 2021-12-30 DIAGNOSIS — K219 Gastro-esophageal reflux disease without esophagitis: Secondary | ICD-10-CM | POA: Diagnosis not present

## 2021-12-30 DIAGNOSIS — G8929 Other chronic pain: Secondary | ICD-10-CM | POA: Diagnosis not present

## 2021-12-30 DIAGNOSIS — D631 Anemia in chronic kidney disease: Secondary | ICD-10-CM | POA: Diagnosis not present

## 2021-12-30 DIAGNOSIS — M545 Low back pain, unspecified: Secondary | ICD-10-CM | POA: Diagnosis not present

## 2021-12-30 DIAGNOSIS — E11319 Type 2 diabetes mellitus with unspecified diabetic retinopathy without macular edema: Secondary | ICD-10-CM | POA: Diagnosis not present

## 2022-01-02 ENCOUNTER — Ambulatory Visit: Payer: Medicare Other

## 2022-01-02 DIAGNOSIS — Z992 Dependence on renal dialysis: Secondary | ICD-10-CM | POA: Diagnosis not present

## 2022-01-02 DIAGNOSIS — D631 Anemia in chronic kidney disease: Secondary | ICD-10-CM | POA: Diagnosis not present

## 2022-01-02 DIAGNOSIS — E875 Hyperkalemia: Secondary | ICD-10-CM | POA: Diagnosis not present

## 2022-01-02 DIAGNOSIS — N2581 Secondary hyperparathyroidism of renal origin: Secondary | ICD-10-CM | POA: Diagnosis not present

## 2022-01-02 DIAGNOSIS — N186 End stage renal disease: Secondary | ICD-10-CM | POA: Diagnosis not present

## 2022-01-02 DIAGNOSIS — D509 Iron deficiency anemia, unspecified: Secondary | ICD-10-CM | POA: Diagnosis not present

## 2022-01-04 DIAGNOSIS — Z992 Dependence on renal dialysis: Secondary | ICD-10-CM | POA: Diagnosis not present

## 2022-01-04 DIAGNOSIS — N186 End stage renal disease: Secondary | ICD-10-CM | POA: Diagnosis not present

## 2022-01-04 DIAGNOSIS — D631 Anemia in chronic kidney disease: Secondary | ICD-10-CM | POA: Diagnosis not present

## 2022-01-04 DIAGNOSIS — N2581 Secondary hyperparathyroidism of renal origin: Secondary | ICD-10-CM | POA: Diagnosis not present

## 2022-01-04 DIAGNOSIS — D509 Iron deficiency anemia, unspecified: Secondary | ICD-10-CM | POA: Diagnosis not present

## 2022-01-04 DIAGNOSIS — E875 Hyperkalemia: Secondary | ICD-10-CM | POA: Diagnosis not present

## 2022-01-06 DIAGNOSIS — Z992 Dependence on renal dialysis: Secondary | ICD-10-CM | POA: Diagnosis not present

## 2022-01-06 DIAGNOSIS — E875 Hyperkalemia: Secondary | ICD-10-CM | POA: Diagnosis not present

## 2022-01-06 DIAGNOSIS — D631 Anemia in chronic kidney disease: Secondary | ICD-10-CM | POA: Diagnosis not present

## 2022-01-06 DIAGNOSIS — N186 End stage renal disease: Secondary | ICD-10-CM | POA: Diagnosis not present

## 2022-01-06 DIAGNOSIS — D509 Iron deficiency anemia, unspecified: Secondary | ICD-10-CM | POA: Diagnosis not present

## 2022-01-06 DIAGNOSIS — N2581 Secondary hyperparathyroidism of renal origin: Secondary | ICD-10-CM | POA: Diagnosis not present

## 2022-01-09 DIAGNOSIS — Z992 Dependence on renal dialysis: Secondary | ICD-10-CM | POA: Diagnosis not present

## 2022-01-09 DIAGNOSIS — D509 Iron deficiency anemia, unspecified: Secondary | ICD-10-CM | POA: Diagnosis not present

## 2022-01-09 DIAGNOSIS — D631 Anemia in chronic kidney disease: Secondary | ICD-10-CM | POA: Diagnosis not present

## 2022-01-09 DIAGNOSIS — E875 Hyperkalemia: Secondary | ICD-10-CM | POA: Diagnosis not present

## 2022-01-09 DIAGNOSIS — N186 End stage renal disease: Secondary | ICD-10-CM | POA: Diagnosis not present

## 2022-01-09 DIAGNOSIS — N2581 Secondary hyperparathyroidism of renal origin: Secondary | ICD-10-CM | POA: Diagnosis not present

## 2022-01-11 DIAGNOSIS — N186 End stage renal disease: Secondary | ICD-10-CM | POA: Diagnosis not present

## 2022-01-11 DIAGNOSIS — E875 Hyperkalemia: Secondary | ICD-10-CM | POA: Diagnosis not present

## 2022-01-11 DIAGNOSIS — Z992 Dependence on renal dialysis: Secondary | ICD-10-CM | POA: Diagnosis not present

## 2022-01-11 DIAGNOSIS — D631 Anemia in chronic kidney disease: Secondary | ICD-10-CM | POA: Diagnosis not present

## 2022-01-11 DIAGNOSIS — N2581 Secondary hyperparathyroidism of renal origin: Secondary | ICD-10-CM | POA: Diagnosis not present

## 2022-01-11 DIAGNOSIS — D509 Iron deficiency anemia, unspecified: Secondary | ICD-10-CM | POA: Diagnosis not present

## 2022-01-12 LAB — AEROBIC CULTURE

## 2022-01-12 LAB — SPECIMEN STATUS REPORT

## 2022-01-13 DIAGNOSIS — N2581 Secondary hyperparathyroidism of renal origin: Secondary | ICD-10-CM | POA: Diagnosis not present

## 2022-01-13 DIAGNOSIS — D509 Iron deficiency anemia, unspecified: Secondary | ICD-10-CM | POA: Diagnosis not present

## 2022-01-13 DIAGNOSIS — D631 Anemia in chronic kidney disease: Secondary | ICD-10-CM | POA: Diagnosis not present

## 2022-01-13 DIAGNOSIS — E875 Hyperkalemia: Secondary | ICD-10-CM | POA: Diagnosis not present

## 2022-01-13 DIAGNOSIS — Z992 Dependence on renal dialysis: Secondary | ICD-10-CM | POA: Diagnosis not present

## 2022-01-13 DIAGNOSIS — N186 End stage renal disease: Secondary | ICD-10-CM | POA: Diagnosis not present

## 2022-01-16 DIAGNOSIS — N2581 Secondary hyperparathyroidism of renal origin: Secondary | ICD-10-CM | POA: Diagnosis not present

## 2022-01-16 DIAGNOSIS — Z992 Dependence on renal dialysis: Secondary | ICD-10-CM | POA: Diagnosis not present

## 2022-01-16 DIAGNOSIS — D631 Anemia in chronic kidney disease: Secondary | ICD-10-CM | POA: Diagnosis not present

## 2022-01-16 DIAGNOSIS — D509 Iron deficiency anemia, unspecified: Secondary | ICD-10-CM | POA: Diagnosis not present

## 2022-01-16 DIAGNOSIS — E875 Hyperkalemia: Secondary | ICD-10-CM | POA: Diagnosis not present

## 2022-01-16 DIAGNOSIS — N186 End stage renal disease: Secondary | ICD-10-CM | POA: Diagnosis not present

## 2022-01-18 DIAGNOSIS — Z992 Dependence on renal dialysis: Secondary | ICD-10-CM | POA: Diagnosis not present

## 2022-01-18 DIAGNOSIS — D509 Iron deficiency anemia, unspecified: Secondary | ICD-10-CM | POA: Diagnosis not present

## 2022-01-18 DIAGNOSIS — N2581 Secondary hyperparathyroidism of renal origin: Secondary | ICD-10-CM | POA: Diagnosis not present

## 2022-01-18 DIAGNOSIS — N186 End stage renal disease: Secondary | ICD-10-CM | POA: Diagnosis not present

## 2022-01-18 DIAGNOSIS — E875 Hyperkalemia: Secondary | ICD-10-CM | POA: Diagnosis not present

## 2022-01-18 DIAGNOSIS — D631 Anemia in chronic kidney disease: Secondary | ICD-10-CM | POA: Diagnosis not present

## 2022-01-19 DIAGNOSIS — D631 Anemia in chronic kidney disease: Secondary | ICD-10-CM | POA: Diagnosis not present

## 2022-01-19 DIAGNOSIS — G4700 Insomnia, unspecified: Secondary | ICD-10-CM | POA: Diagnosis not present

## 2022-01-19 DIAGNOSIS — M25551 Pain in right hip: Secondary | ICD-10-CM | POA: Diagnosis not present

## 2022-01-19 DIAGNOSIS — E785 Hyperlipidemia, unspecified: Secondary | ICD-10-CM | POA: Diagnosis not present

## 2022-01-19 DIAGNOSIS — M545 Low back pain, unspecified: Secondary | ICD-10-CM | POA: Diagnosis not present

## 2022-01-19 DIAGNOSIS — I10 Essential (primary) hypertension: Secondary | ICD-10-CM | POA: Diagnosis not present

## 2022-01-19 DIAGNOSIS — G8929 Other chronic pain: Secondary | ICD-10-CM | POA: Diagnosis not present

## 2022-01-19 DIAGNOSIS — E11319 Type 2 diabetes mellitus with unspecified diabetic retinopathy without macular edema: Secondary | ICD-10-CM | POA: Diagnosis not present

## 2022-01-19 DIAGNOSIS — Z992 Dependence on renal dialysis: Secondary | ICD-10-CM | POA: Diagnosis not present

## 2022-01-19 DIAGNOSIS — N186 End stage renal disease: Secondary | ICD-10-CM | POA: Diagnosis not present

## 2022-01-20 DIAGNOSIS — D631 Anemia in chronic kidney disease: Secondary | ICD-10-CM | POA: Diagnosis not present

## 2022-01-20 DIAGNOSIS — N2581 Secondary hyperparathyroidism of renal origin: Secondary | ICD-10-CM | POA: Diagnosis not present

## 2022-01-20 DIAGNOSIS — N186 End stage renal disease: Secondary | ICD-10-CM | POA: Diagnosis not present

## 2022-01-20 DIAGNOSIS — D509 Iron deficiency anemia, unspecified: Secondary | ICD-10-CM | POA: Diagnosis not present

## 2022-01-20 DIAGNOSIS — Z992 Dependence on renal dialysis: Secondary | ICD-10-CM | POA: Diagnosis not present

## 2022-01-20 DIAGNOSIS — E875 Hyperkalemia: Secondary | ICD-10-CM | POA: Diagnosis not present

## 2022-01-21 DIAGNOSIS — Z992 Dependence on renal dialysis: Secondary | ICD-10-CM | POA: Diagnosis not present

## 2022-01-21 DIAGNOSIS — N186 End stage renal disease: Secondary | ICD-10-CM | POA: Diagnosis not present

## 2022-01-21 DIAGNOSIS — E1122 Type 2 diabetes mellitus with diabetic chronic kidney disease: Secondary | ICD-10-CM | POA: Diagnosis not present
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
## 2022-02-16 ENCOUNTER — Encounter: Payer: Self-pay | Admitting: Cardiology

## 2022-02-16 ENCOUNTER — Ambulatory Visit: Payer: Medicare Other | Attending: Cardiology | Admitting: Cardiology

## 2022-02-16 VITALS — BP 152/60 | HR 61 | Ht 72.0 in | Wt 172.8 lb

## 2022-02-16 DIAGNOSIS — E782 Mixed hyperlipidemia: Secondary | ICD-10-CM

## 2022-02-16 DIAGNOSIS — Z95810 Presence of automatic (implantable) cardiac defibrillator: Secondary | ICD-10-CM | POA: Diagnosis not present

## 2022-02-16 DIAGNOSIS — Z79899 Other long term (current) drug therapy: Secondary | ICD-10-CM

## 2022-02-16 DIAGNOSIS — I110 Hypertensive heart disease with heart failure: Secondary | ICD-10-CM | POA: Diagnosis not present

## 2022-02-16 DIAGNOSIS — I472 Ventricular tachycardia, unspecified: Secondary | ICD-10-CM

## 2022-02-16 DIAGNOSIS — I5032 Chronic diastolic (congestive) heart failure: Secondary | ICD-10-CM

## 2022-02-16 DIAGNOSIS — I2510 Atherosclerotic heart disease of native coronary artery without angina pectoris: Secondary | ICD-10-CM

## 2022-02-16 DIAGNOSIS — I5022 Chronic systolic (congestive) heart failure: Secondary | ICD-10-CM

## 2022-02-16 DIAGNOSIS — I120 Hypertensive chronic kidney disease with stage 5 chronic kidney disease or end stage renal disease: Secondary | ICD-10-CM

## 2022-02-16 MED ORDER — NITROGLYCERIN 0.4 MG SL SUBL: 0.4000 mg | SUBLINGUAL_TABLET | SUBLINGUAL | 3 refills | Status: DC | PRN

## 2022-02-16 NOTE — Patient Instructions (Signed)
Medication Instructions:  No medication changes. *If you need a refill on your cardiac medications before your next appointment, please call your pharmacy*   Lab Work: You need the following labs TSH, free T3, free T4 and lipids. If you have labs (blood work) drawn today and your tests are completely normal, you will receive your results only by: [place] (if you have MyChart) OR A paper copy in the mail If you have any lab test that is abnormal or we need to change your treatment, we will call you to review the results.   Testing/Procedures: None ordered   Follow-Up: At [facility], you and your health needs are our priority.  As part of our continuing mission to provide you with exceptional heart care, we have created designated Provider Care Teams.  These Care Teams include your primary Cardiologist (physician) and Advanced Practice Providers (APPs -  Physician Assistants and Nurse Practitioners) who all work together to provide you with the care you need, when you need it.  We recommend signing up for the patient portal called "MyChart".  Sign up information is provided on this After Visit Summary.  MyChart is used to connect with patients for Virtual Visits (Telemedicine).  Patients are able to view lab/test results, encounter notes, upcoming appointments, etc.  Non-urgent messages can be sent to your provider as well.   To learn more about what you can do with MyChart, go to [email or web address].    Your next appointment:   6 month(s)  The format for your next appointment:   In Person  Provider:   Shirlee More, MD   Other Instructions NA

## 2022-02-27 ENCOUNTER — Ambulatory Visit (INDEPENDENT_AMBULATORY_CARE_PROVIDER_SITE_OTHER): Payer: Medicare Other

## 2022-02-27 DIAGNOSIS — I472 Ventricular tachycardia, unspecified: Secondary | ICD-10-CM | POA: Diagnosis not present

## 2022-03-01 LAB — CUP PACEART REMOTE DEVICE CHECK
Battery Remaining Percentage: 75 %
Date Time Interrogation Session: 20231106093800
Implantable Lead Connection Status: 753985
Implantable Lead Implant Date: 20210923
Implantable Lead Location: 753862
Implantable Lead Model: 3501
Implantable Lead Serial Number: 194255
Implantable Pulse Generator Implant Date: 20210923
Pulse Gen Serial Number: 140488

## 2022-03-07 ENCOUNTER — Ambulatory Visit: Payer: Medicare Other | Attending: Cardiology

## 2022-03-07 DIAGNOSIS — I120 Hypertensive chronic kidney disease with stage 5 chronic kidney disease or end stage renal disease: Secondary | ICD-10-CM

## 2022-03-07 DIAGNOSIS — I5032 Chronic diastolic (congestive) heart failure: Secondary | ICD-10-CM | POA: Diagnosis not present

## 2022-03-07 LAB — ECHOCARDIOGRAM COMPLETE
Area-P 1/2: 6.71 cm2
MV M vel: 3.84 m/s
MV Peak grad: 59 mmHg
P 1/2 time: 624 msec
S' Lateral: 5.5 cm

## 2022-03-08 ENCOUNTER — Telehealth: Payer: Self-pay

## 2022-03-08 NOTE — Telephone Encounter (Signed)
-----   Message from Richardo Priest, MD sent at 03/08/2022  8:23 AM EST ----- Unfortunately despite good treatment and doing well heart function is unimproved remains severely reduced   Would he like to see Dr Tempie Hoist specialist in this disorder in [place] to see if any other beneficial endeavors for him?  If yes place referral

## 2022-04-05 NOTE — Progress Notes (Signed)
Remote ICD transmission.   

## 2022-04-06 ENCOUNTER — Other Ambulatory Visit: Payer: Self-pay | Admitting: Cardiology

## 2022-05-03 ENCOUNTER — Other Ambulatory Visit: Payer: Self-pay | Admitting: Cardiology

## 2022-05-04 NOTE — Telephone Encounter (Signed)
Rx refill sent to pharmacy. 
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
## 2022-05-29 ENCOUNTER — Ambulatory Visit: Payer: Medicare Other

## 2022-05-29 DIAGNOSIS — I469 Cardiac arrest, cause unspecified: Secondary | ICD-10-CM

## 2022-05-30 LAB — CUP PACEART REMOTE DEVICE CHECK
Battery Remaining Percentage: 72 %
Date Time Interrogation Session: 20240205120800
Implantable Lead Connection Status: 753985
Implantable Lead Implant Date: 20210923
Implantable Lead Location: 753862
Implantable Lead Model: 3501
Implantable Lead Serial Number: 194255
Implantable Pulse Generator Implant Date: 20210923
Pulse Gen Serial Number: 140488

## 2022-06-27 ENCOUNTER — Ambulatory Visit: Payer: Medicare Other | Admitting: Podiatry

## 2022-06-27 DIAGNOSIS — E1142 Type 2 diabetes mellitus with diabetic polyneuropathy: Secondary | ICD-10-CM

## 2022-06-27 DIAGNOSIS — M79674 Pain in right toe(s): Secondary | ICD-10-CM

## 2022-06-27 DIAGNOSIS — N186 End stage renal disease: Secondary | ICD-10-CM

## 2022-06-27 DIAGNOSIS — I739 Peripheral vascular disease, unspecified: Secondary | ICD-10-CM

## 2022-06-27 DIAGNOSIS — Z992 Dependence on renal dialysis: Secondary | ICD-10-CM

## 2022-06-27 DIAGNOSIS — M79675 Pain in left toe(s): Secondary | ICD-10-CM

## 2022-06-27 DIAGNOSIS — L84 Corns and callosities: Secondary | ICD-10-CM

## 2022-06-27 DIAGNOSIS — B351 Tinea unguium: Secondary | ICD-10-CM | POA: Diagnosis not present

## 2022-06-27 NOTE — Progress Notes (Signed)
  Subjective:  Patient ID: James Chan, male    DOB: 1950-05-10,  MRN: [id number]  Chief Complaint  Patient presents with   Nail Problem    diabetic foot care/nail trim    72 y.o. male presents with the above complaint. History confirmed with patient. Patient presenting with pain related to dystrophic thickened elongated nails. Patient is unable to trim own nails related to nail dystrophy and/or mobility issues. Patient does have a history of T2DM.  Prior history of ulceration of the distal tuft of the right great toe however it is healed up no concern at the area at this time.  Objective:  Physical Exam: warm, good capillary refill nail exam onychomycosis of the toenails, onycholysis, and dystrophic nails DP pulses palpable, PT pulses palpable, and protective sensation absent Left Foot:  Pain with palpation of nails due to elongation and dystrophic growth.  Right Foot: Pain with palpation of nails due to elongation and dystrophic growth.  Preulcerative callus present at the distal tuft of the right second toe.  No underlying ulceration.  Related to hammertoe deformity present  Assessment:   1. Pain due to onychomycosis of toenails of both feet   2. Pre-ulcerative calluses   3. PAD (peripheral artery disease) ([place])   4. DM type 2 with diabetic peripheral neuropathy ([place])   5. ESRD on dialysis [facility])      Plan:  Patient was evaluated and treated and all questions answered.   #Onychomycosis with pain  -Nails palliatively debrided as below. -Educated on self-care  Procedure: Nail Debridement Rationale: Pain Type of Debridement: manual, sharp debridement. Instrumentation: Nail nipper, rotary burr. Number of Nails: 10  Return in about 3 months (around 09/27/2022) for [facility].         Everitt Amber, DPM [facility]

## 2022-07-13 NOTE — Progress Notes (Signed)
Remote ICD transmission.   

## 2022-08-28 ENCOUNTER — Ambulatory Visit: Payer: Medicare Other

## 2022-09-15 ENCOUNTER — Other Ambulatory Visit: Payer: Self-pay | Admitting: Cardiology

## 2022-09-27 ENCOUNTER — Ambulatory Visit: Payer: Medicare Other | Admitting: Podiatry

## 2022-10-06 ENCOUNTER — Encounter: Payer: Self-pay | Admitting: Podiatry

## 2022-10-11 ENCOUNTER — Other Ambulatory Visit: Payer: Self-pay | Admitting: Cardiology

## 2022-10-12 ENCOUNTER — Ambulatory Visit: Payer: Medicare Other | Admitting: Podiatry

## 2022-10-24 ENCOUNTER — Ambulatory Visit (INDEPENDENT_AMBULATORY_CARE_PROVIDER_SITE_OTHER): Payer: Medicare Other | Admitting: Podiatry

## 2022-10-24 DIAGNOSIS — Z91199 Patient's noncompliance with other medical treatment and regimen due to unspecified reason: Secondary | ICD-10-CM

## 2022-10-24 NOTE — Progress Notes (Signed)
Pt was a no show for apt, [place] 

## 2022-11-27 ENCOUNTER — Ambulatory Visit (INDEPENDENT_AMBULATORY_CARE_PROVIDER_SITE_OTHER): Payer: Medicare Other

## 2022-11-27 DIAGNOSIS — I469 Cardiac arrest, cause unspecified: Secondary | ICD-10-CM | POA: Diagnosis not present

## 2022-12-12 NOTE — Progress Notes (Signed)
Remote ICD transmission.   

## 2023-01-05 ENCOUNTER — Other Ambulatory Visit: Payer: Self-pay | Admitting: Cardiology

## 2023-01-21 NOTE — Progress Notes (Unsigned)
lb Date of Birth:  1951/01/19       BSA:          2.002 m Patient Age:    72 years         BP:           152/60 mmHg Patient Gender: M                HR:           75 bpm. 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 CHF, Previous Myocardial Infarction and CAD, Stroke; Risk Factors:Former Smoker, Diabetes, Hypertension and Dyslipidemia.  Sonographer:    James Chan RDCS Referring Phys: [id number] James Chan J James Chan  IMPRESSIONS   1. Left ventricular ejection fraction, by estimation, is 20 to 25%. Left ventricular ejection fraction by PLAX is 24 %. The left ventricle has severely decreased function. The left ventricle demonstrates global hypokinesis. The left ventricular internal cavity size was mildly dilated. Left ventricular diastolic parameters are consistent with Grade III diastolic dysfunction (restrictive). Elevated left atrial pressure. The average left ventricular global longitudinal strain is -5.2 %. The global longitudinal strain is abnormal. 2. Right ventricular systolic function is moderately reduced. The  right ventricular size is normal. There is severely elevated pulmonary artery systolic pressure. 3. Left atrial size was mildly dilated. 4. Right atrial size was moderately dilated. 5. The mitral valve is normal in structure. Mild mitral valve regurgitation. No evidence of mitral stenosis. 6. The aortic valve is normal in structure. Aortic valve regurgitation is mild. Aortic valve sclerosis is present, with no evidence of aortic valve stenosis. 7. The inferior vena cava is dilated in size with <50% respiratory variability, suggesting right atrial pressure of 15 mmHg.  FINDINGS Left Ventricle: Left ventricular ejection fraction, by estimation, is 20 to 25%. Left ventricular ejection fraction by PLAX is 24 %. The left ventricle has severely decreased function. The left ventricle demonstrates global hypokinesis. The average left ventricular global longitudinal strain is -5.2 %. The global longitudinal strain is abnormal. The left ventricular internal cavity size was mildly dilated. There is no left ventricular hypertrophy. Left ventricular diastolic parameters are consistent with Grade III diastolic dysfunction (restrictive). Elevated left atrial pressure.  Right Ventricle: The right ventricular size is normal. No increase in right ventricular wall thickness. Right ventricular systolic function is moderately reduced. There is severely elevated pulmonary artery systolic pressure. The tricuspid regurgitant velocity is 4.30 m/s, and with an assumed right atrial pressure of 15 mmHg, the estimated right ventricular systolic pressure is 89.0 mmHg.  Left Atrium: Left atrial size was mildly dilated.  Right Atrium: Right atrial size was moderately dilated.  Pericardium: There is no evidence of pericardial effusion.  Mitral Valve: The mitral valve is normal in structure. Mild mitral valve regurgitation. No evidence of mitral valve stenosis.  Tricuspid Valve: The tricuspid valve is normal in structure.  Tricuspid valve regurgitation is mild . No evidence of tricuspid stenosis.  Aortic Valve: The aortic valve is normal in structure. Aortic valve regurgitation is mild. Aortic regurgitation PHT measures 624 msec. Aortic valve sclerosis is present, with no evidence of aortic valve stenosis.  Pulmonic Valve: The pulmonic valve  Cardiology Office Note:    Date:  01/22/2023   ID:  James Chan, DOB [birth year], MRN [id number]  PCP:  James Curia, MD  Cardiologist:  James Herrlich, MD    Referring MD: James Curia, MD    ASSESSMENT:    1. Ventricular tachycardia (HCC)   2. S/P ICD (internal cardiac defibrillator) procedure 01/15/20 [facility]    3. On amiodarone therapy   4. 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 Coronary artery disease involving native coronary artery of native heart without angina pectoris   7. Mixed hyperlipidemia   8. Chronic anticoagulation    PLAN:    In order of problems listed above:  James Chan continues to do surprisingly well with his complex heart disease he has an ICD he has received no VT VF therapy and he is on low-dose amiodarone. Bring to the attention nephrology to be sure that they do thyroid studies every 6 months as well as a lipid profile and asked him to send copies to me Heart failure is compensated with hemodialysis ultrafiltration and he tolerates his hydralazine vasodilator therapy He will continue to take midodrine the days of dialysis to avoid hypotension Stable CAD having no anginal discomfort continue clopidogrel and switch to nonstatin lipid-lowering therapy.   Next appointment: 6 months   Medication Adjustments/Labs and Tests Ordered: Current medicines are reviewed at length with the patient today.  Concerns regarding medicines are outlined above.  Orders Placed This Encounter  Procedures   EKG 12-Lead   No orders of the defined types were placed in this encounter.    History of Present Illness:    James Chan is a 72 y.o. male with a hx of multiple complex cardiac and medical problems including severe end-stage renal disease with hemodialysis replacement therapy complex cardiac disease  including ventricular tachycardia with cardiac arrest on amiodarone paroxysmal atrial fibrillation subcutaneous ICD chronic anticoagulation coronary artery disease hypertensive heart disease with heart failure managed with hemodialysis hyperlipidemia peripheral arterial disease with PCI coated stent to left iliac artery symptomatic hypotension necessitating midodrine support.  He last seen 02/16/2022.  Following that visit he had an echocardiogram performed showing continued severe LV dysfunction EF 20 to 25% with global hypokinesia dilated and restrictive diastolic filling pattern right ventricular function was moderately reduced.  His last device check showed normal lead battery and ICD function.  Last echo 03/07/2022 EF 20 to 25%.  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 He will discontinue his statin wait 2 weeks and start a PCSK9 inhibitor I told him he should see a prompt improvement He takes midodrine 3 day a week when he arrives at hemodialysis he tolerates his hydralazine. He is not having edema shortness of breath chest pain palpitation or syncope Overall he is pleased with the quality of his life  lb Date of Birth:  1951/01/19       BSA:          2.002 m Patient Age:    72 years         BP:           152/60 mmHg Patient Gender: M                HR:           75 bpm. 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 CHF, Previous Myocardial Infarction and CAD, Stroke; Risk Factors:Former Smoker, Diabetes, Hypertension and Dyslipidemia.  Sonographer:    James Chan RDCS Referring Phys: [id number] James Chan J James Chan  IMPRESSIONS   1. Left ventricular ejection fraction, by estimation, is 20 to 25%. Left ventricular ejection fraction by PLAX is 24 %. The left ventricle has severely decreased function. The left ventricle demonstrates global hypokinesis. The left ventricular internal cavity size was mildly dilated. Left ventricular diastolic parameters are consistent with Grade III diastolic dysfunction (restrictive). Elevated left atrial pressure. The average left ventricular global longitudinal strain is -5.2 %. The global longitudinal strain is abnormal. 2. Right ventricular systolic function is moderately reduced. The  right ventricular size is normal. There is severely elevated pulmonary artery systolic pressure. 3. Left atrial size was mildly dilated. 4. Right atrial size was moderately dilated. 5. The mitral valve is normal in structure. Mild mitral valve regurgitation. No evidence of mitral stenosis. 6. The aortic valve is normal in structure. Aortic valve regurgitation is mild. Aortic valve sclerosis is present, with no evidence of aortic valve stenosis. 7. The inferior vena cava is dilated in size with <50% respiratory variability, suggesting right atrial pressure of 15 mmHg.  FINDINGS Left Ventricle: Left ventricular ejection fraction, by estimation, is 20 to 25%. Left ventricular ejection fraction by PLAX is 24 %. The left ventricle has severely decreased function. The left ventricle demonstrates global hypokinesis. The average left ventricular global longitudinal strain is -5.2 %. The global longitudinal strain is abnormal. The left ventricular internal cavity size was mildly dilated. There is no left ventricular hypertrophy. Left ventricular diastolic parameters are consistent with Grade III diastolic dysfunction (restrictive). Elevated left atrial pressure.  Right Ventricle: The right ventricular size is normal. No increase in right ventricular wall thickness. Right ventricular systolic function is moderately reduced. There is severely elevated pulmonary artery systolic pressure. The tricuspid regurgitant velocity is 4.30 m/s, and with an assumed right atrial pressure of 15 mmHg, the estimated right ventricular systolic pressure is 89.0 mmHg.  Left Atrium: Left atrial size was mildly dilated.  Right Atrium: Right atrial size was moderately dilated.  Pericardium: There is no evidence of pericardial effusion.  Mitral Valve: The mitral valve is normal in structure. Mild mitral valve regurgitation. No evidence of mitral valve stenosis.  Tricuspid Valve: The tricuspid valve is normal in structure.  Tricuspid valve regurgitation is mild . No evidence of tricuspid stenosis.  Aortic Valve: The aortic valve is normal in structure. Aortic valve regurgitation is mild. Aortic regurgitation PHT measures 624 msec. Aortic valve sclerosis is present, with no evidence of aortic valve stenosis.  Pulmonic Valve: The pulmonic valve  Cardiology Office Note:    Date:  01/22/2023   ID:  James Chan, DOB [birth year], MRN [id number]  PCP:  James Curia, MD  Cardiologist:  James Herrlich, MD    Referring MD: James Curia, MD    ASSESSMENT:    1. Ventricular tachycardia (HCC)   2. S/P ICD (internal cardiac defibrillator) procedure 01/15/20 [facility]    3. On amiodarone therapy   4. 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 Coronary artery disease involving native coronary artery of native heart without angina pectoris   7. Mixed hyperlipidemia   8. Chronic anticoagulation    PLAN:    In order of problems listed above:  James Chan continues to do surprisingly well with his complex heart disease he has an ICD he has received no VT VF therapy and he is on low-dose amiodarone. Bring to the attention nephrology to be sure that they do thyroid studies every 6 months as well as a lipid profile and asked him to send copies to me Heart failure is compensated with hemodialysis ultrafiltration and he tolerates his hydralazine vasodilator therapy He will continue to take midodrine the days of dialysis to avoid hypotension Stable CAD having no anginal discomfort continue clopidogrel and switch to nonstatin lipid-lowering therapy.   Next appointment: 6 months   Medication Adjustments/Labs and Tests Ordered: Current medicines are reviewed at length with the patient today.  Concerns regarding medicines are outlined above.  Orders Placed This Encounter  Procedures   EKG 12-Lead   No orders of the defined types were placed in this encounter.    History of Present Illness:    James Chan is a 72 y.o. male with a hx of multiple complex cardiac and medical problems including severe end-stage renal disease with hemodialysis replacement therapy complex cardiac disease  including ventricular tachycardia with cardiac arrest on amiodarone paroxysmal atrial fibrillation subcutaneous ICD chronic anticoagulation coronary artery disease hypertensive heart disease with heart failure managed with hemodialysis hyperlipidemia peripheral arterial disease with PCI coated stent to left iliac artery symptomatic hypotension necessitating midodrine support.  He last seen 02/16/2022.  Following that visit he had an echocardiogram performed showing continued severe LV dysfunction EF 20 to 25% with global hypokinesia dilated and restrictive diastolic filling pattern right ventricular function was moderately reduced.  His last device check showed normal lead battery and ICD function.  Last echo 03/07/2022 EF 20 to 25%.  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 He will discontinue his statin wait 2 weeks and start a PCSK9 inhibitor I told him he should see a prompt improvement He takes midodrine 3 day a week when he arrives at hemodialysis he tolerates his hydralazine. He is not having edema shortness of breath chest pain palpitation or syncope Overall he is pleased with the quality of his life  Cardiology Office Note:    Date:  01/22/2023   ID:  James Chan, DOB [birth year], MRN [id number]  PCP:  James Curia, MD  Cardiologist:  James Herrlich, MD    Referring MD: James Curia, MD    ASSESSMENT:    1. Ventricular tachycardia (HCC)   2. S/P ICD (internal cardiac defibrillator) procedure 01/15/20 [facility]    3. On amiodarone therapy   4. 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 Coronary artery disease involving native coronary artery of native heart without angina pectoris   7. Mixed hyperlipidemia   8. Chronic anticoagulation    PLAN:    In order of problems listed above:  James Chan continues to do surprisingly well with his complex heart disease he has an ICD he has received no VT VF therapy and he is on low-dose amiodarone. Bring to the attention nephrology to be sure that they do thyroid studies every 6 months as well as a lipid profile and asked him to send copies to me Heart failure is compensated with hemodialysis ultrafiltration and he tolerates his hydralazine vasodilator therapy He will continue to take midodrine the days of dialysis to avoid hypotension Stable CAD having no anginal discomfort continue clopidogrel and switch to nonstatin lipid-lowering therapy.   Next appointment: 6 months   Medication Adjustments/Labs and Tests Ordered: Current medicines are reviewed at length with the patient today.  Concerns regarding medicines are outlined above.  Orders Placed This Encounter  Procedures   EKG 12-Lead   No orders of the defined types were placed in this encounter.    History of Present Illness:    James Chan is a 72 y.o. male with a hx of multiple complex cardiac and medical problems including severe end-stage renal disease with hemodialysis replacement therapy complex cardiac disease  including ventricular tachycardia with cardiac arrest on amiodarone paroxysmal atrial fibrillation subcutaneous ICD chronic anticoagulation coronary artery disease hypertensive heart disease with heart failure managed with hemodialysis hyperlipidemia peripheral arterial disease with PCI coated stent to left iliac artery symptomatic hypotension necessitating midodrine support.  He last seen 02/16/2022.  Following that visit he had an echocardiogram performed showing continued severe LV dysfunction EF 20 to 25% with global hypokinesia dilated and restrictive diastolic filling pattern right ventricular function was moderately reduced.  His last device check showed normal lead battery and ICD function.  Last echo 03/07/2022 EF 20 to 25%.  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 He will discontinue his statin wait 2 weeks and start a PCSK9 inhibitor I told him he should see a prompt improvement He takes midodrine 3 day a week when he arrives at hemodialysis he tolerates his hydralazine. He is not having edema shortness of breath chest pain palpitation or syncope Overall he is pleased with the quality of his life  lb Date of Birth:  1951/01/19       BSA:          2.002 m Patient Age:    72 years         BP:           152/60 mmHg Patient Gender: M                HR:           75 bpm. 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 CHF, Previous Myocardial Infarction and CAD, Stroke; Risk Factors:Former Smoker, Diabetes, Hypertension and Dyslipidemia.  Sonographer:    James Chan RDCS Referring Phys: [id number] James Chan J James Chan  IMPRESSIONS   1. Left ventricular ejection fraction, by estimation, is 20 to 25%. Left ventricular ejection fraction by PLAX is 24 %. The left ventricle has severely decreased function. The left ventricle demonstrates global hypokinesis. The left ventricular internal cavity size was mildly dilated. Left ventricular diastolic parameters are consistent with Grade III diastolic dysfunction (restrictive). Elevated left atrial pressure. The average left ventricular global longitudinal strain is -5.2 %. The global longitudinal strain is abnormal. 2. Right ventricular systolic function is moderately reduced. The  right ventricular size is normal. There is severely elevated pulmonary artery systolic pressure. 3. Left atrial size was mildly dilated. 4. Right atrial size was moderately dilated. 5. The mitral valve is normal in structure. Mild mitral valve regurgitation. No evidence of mitral stenosis. 6. The aortic valve is normal in structure. Aortic valve regurgitation is mild. Aortic valve sclerosis is present, with no evidence of aortic valve stenosis. 7. The inferior vena cava is dilated in size with <50% respiratory variability, suggesting right atrial pressure of 15 mmHg.  FINDINGS Left Ventricle: Left ventricular ejection fraction, by estimation, is 20 to 25%. Left ventricular ejection fraction by PLAX is 24 %. The left ventricle has severely decreased function. The left ventricle demonstrates global hypokinesis. The average left ventricular global longitudinal strain is -5.2 %. The global longitudinal strain is abnormal. The left ventricular internal cavity size was mildly dilated. There is no left ventricular hypertrophy. Left ventricular diastolic parameters are consistent with Grade III diastolic dysfunction (restrictive). Elevated left atrial pressure.  Right Ventricle: The right ventricular size is normal. No increase in right ventricular wall thickness. Right ventricular systolic function is moderately reduced. There is severely elevated pulmonary artery systolic pressure. The tricuspid regurgitant velocity is 4.30 m/s, and with an assumed right atrial pressure of 15 mmHg, the estimated right ventricular systolic pressure is 89.0 mmHg.  Left Atrium: Left atrial size was mildly dilated.  Right Atrium: Right atrial size was moderately dilated.  Pericardium: There is no evidence of pericardial effusion.  Mitral Valve: The mitral valve is normal in structure. Mild mitral valve regurgitation. No evidence of mitral valve stenosis.  Tricuspid Valve: The tricuspid valve is normal in structure.  Tricuspid valve regurgitation is mild . No evidence of tricuspid stenosis.  Aortic Valve: The aortic valve is normal in structure. Aortic valve regurgitation is mild. Aortic regurgitation PHT measures 624 msec. Aortic valve sclerosis is present, with no evidence of aortic valve stenosis.  Pulmonic Valve: The pulmonic valve  lb Date of Birth:  1951/01/19       BSA:          2.002 m Patient Age:    72 years         BP:           152/60 mmHg Patient Gender: M                HR:           75 bpm. 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 CHF, Previous Myocardial Infarction and CAD, Stroke; Risk Factors:Former Smoker, Diabetes, Hypertension and Dyslipidemia.  Sonographer:    James Chan RDCS Referring Phys: [id number] James Chan J James Chan  IMPRESSIONS   1. Left ventricular ejection fraction, by estimation, is 20 to 25%. Left ventricular ejection fraction by PLAX is 24 %. The left ventricle has severely decreased function. The left ventricle demonstrates global hypokinesis. The left ventricular internal cavity size was mildly dilated. Left ventricular diastolic parameters are consistent with Grade III diastolic dysfunction (restrictive). Elevated left atrial pressure. The average left ventricular global longitudinal strain is -5.2 %. The global longitudinal strain is abnormal. 2. Right ventricular systolic function is moderately reduced. The  right ventricular size is normal. There is severely elevated pulmonary artery systolic pressure. 3. Left atrial size was mildly dilated. 4. Right atrial size was moderately dilated. 5. The mitral valve is normal in structure. Mild mitral valve regurgitation. No evidence of mitral stenosis. 6. The aortic valve is normal in structure. Aortic valve regurgitation is mild. Aortic valve sclerosis is present, with no evidence of aortic valve stenosis. 7. The inferior vena cava is dilated in size with <50% respiratory variability, suggesting right atrial pressure of 15 mmHg.  FINDINGS Left Ventricle: Left ventricular ejection fraction, by estimation, is 20 to 25%. Left ventricular ejection fraction by PLAX is 24 %. The left ventricle has severely decreased function. The left ventricle demonstrates global hypokinesis. The average left ventricular global longitudinal strain is -5.2 %. The global longitudinal strain is abnormal. The left ventricular internal cavity size was mildly dilated. There is no left ventricular hypertrophy. Left ventricular diastolic parameters are consistent with Grade III diastolic dysfunction (restrictive). Elevated left atrial pressure.  Right Ventricle: The right ventricular size is normal. No increase in right ventricular wall thickness. Right ventricular systolic function is moderately reduced. There is severely elevated pulmonary artery systolic pressure. The tricuspid regurgitant velocity is 4.30 m/s, and with an assumed right atrial pressure of 15 mmHg, the estimated right ventricular systolic pressure is 89.0 mmHg.  Left Atrium: Left atrial size was mildly dilated.  Right Atrium: Right atrial size was moderately dilated.  Pericardium: There is no evidence of pericardial effusion.  Mitral Valve: The mitral valve is normal in structure. Mild mitral valve regurgitation. No evidence of mitral valve stenosis.  Tricuspid Valve: The tricuspid valve is normal in structure.  Tricuspid valve regurgitation is mild . No evidence of tricuspid stenosis.  Aortic Valve: The aortic valve is normal in structure. Aortic valve regurgitation is mild. Aortic regurgitation PHT measures 624 msec. Aortic valve sclerosis is present, with no evidence of aortic valve stenosis.  Pulmonic Valve: The pulmonic valve

## 2023-01-22 ENCOUNTER — Ambulatory Visit: Payer: Medicare Other | Attending: Cardiology | Admitting: Cardiology

## 2023-01-22 ENCOUNTER — Encounter: Payer: Self-pay | Admitting: Cardiology

## 2023-01-22 VITALS — BP 110/60 | HR 57 | Ht 72.0 in | Wt 161.2 lb

## 2023-01-22 DIAGNOSIS — Z79899 Other long term (current) drug therapy: Secondary | ICD-10-CM | POA: Diagnosis not present

## 2023-01-22 DIAGNOSIS — I472 Ventricular tachycardia, unspecified: Secondary | ICD-10-CM

## 2023-01-22 DIAGNOSIS — Z95810 Presence of automatic (implantable) cardiac defibrillator: Secondary | ICD-10-CM

## 2023-01-22 DIAGNOSIS — I2510 Atherosclerotic heart disease of native coronary artery without angina pectoris: Secondary | ICD-10-CM

## 2023-01-22 DIAGNOSIS — I110 Hypertensive heart disease with heart failure: Secondary | ICD-10-CM

## 2023-01-22 DIAGNOSIS — E782 Mixed hyperlipidemia: Secondary | ICD-10-CM

## 2023-01-22 DIAGNOSIS — I120 Hypertensive chronic kidney disease with stage 5 chronic kidney disease or end stage renal disease: Secondary | ICD-10-CM

## 2023-01-22 DIAGNOSIS — I5022 Chronic systolic (congestive) heart failure: Secondary | ICD-10-CM

## 2023-01-22 DIAGNOSIS — Z7901 Long term (current) use of anticoagulants: Secondary | ICD-10-CM

## 2023-01-22 MED ORDER — AMIODARONE HCL 200 MG PO TABS: 200.0000 mg | ORAL_TABLET | Freq: Every day | ORAL | 3 refills | Status: DC

## 2023-01-22 MED ORDER — REPATHA SURECLICK 140 MG/ML ~~LOC~~ SOAJ: 140.0000 mg | SUBCUTANEOUS | 6 refills | Status: DC

## 2023-01-22 NOTE — Patient Instructions (Signed)
Medication Instructions:  Your physician has recommended you make the following change in your medication:   START: Amiodarone 200 mg daily STOP: Atorvastatin (wait 2 weeks then) START: Repatha 140 mg every 2 weeks.  *If you need a refill on your cardiac medications before your next appointment, please call your pharmacy*   Lab Work: None If you have labs (blood work) drawn today and your tests are completely normal, you will receive your results only by: MyChart Message (if you have MyChart) OR A paper copy in the mail If you have any lab test that is abnormal or we need to change your treatment, we will call you to review the results.   Testing/Procedures: None   Follow-Up: At [facility], you and your health needs are our priority.  As part of our continuing mission to provide you with exceptional heart care, we have created designated Provider Care Teams.  These Care Teams include your primary Cardiologist (physician) and Advanced Practice Providers (APPs -  Physician Assistants and Nurse Practitioners) who all work together to provide you with the care you need, when you need it.  We recommend signing up for the patient portal called "MyChart".  Sign up information is provided on this After Visit Summary.  MyChart is used to connect with patients for Virtual Visits (Telemedicine).  Patients are able to view lab/test results, encounter notes, upcoming appointments, etc.  Non-urgent messages can be sent to your provider as well.   To learn more about what you can do with MyChart, go to [email or web address].    Your next appointment:   6 month(s)  Provider:   Norman Herrlich, MD    Other Instructions None

## 2023-01-23 ENCOUNTER — Ambulatory Visit: Payer: Medicare Other | Admitting: Podiatry

## 2023-01-25 ENCOUNTER — Telehealth: Payer: Self-pay | Admitting: Pharmacy Technician

## 2023-01-25 ENCOUNTER — Telehealth: Payer: Self-pay | Admitting: Cardiology

## 2023-01-25 ENCOUNTER — Other Ambulatory Visit (HOSPITAL_COMMUNITY): Payer: Self-pay

## 2023-01-25 NOTE — Telephone Encounter (Signed)
Pharmacy Patient Advocate Encounter   Received notification from Pt Calls Messages that prior authorization for repatha is required/requested.   Insurance verification completed.   The patient is insured through [facility] .   Per test claim: PA required; PA submitted to [facility] via CoverMyMeds Key/confirmation #/EOC [id number] Status is pending

## 2023-01-25 NOTE — Telephone Encounter (Signed)
Pt c/o medication issue:  1. Name of Medication:   Evolocumab (REPATHA SURECLICK) 140 MG/ML SOAJ    2. How are you currently taking this medication (dosage and times per day)? As prescribed   3. Are you having a reaction (difficulty breathing--STAT)?   4. What is your medication issue? Pharmacy is calling to get update on prior auth for this medication. Please advise.

## 2023-01-25 NOTE — Telephone Encounter (Signed)
Pharmacy Patient Advocate Encounter  Received notification from [facility] that Prior Authorization for repatha has been APPROVED from 01/25/23 to 07/26/23   PA #/Case ID/Reference #: [id number]

## 2023-01-25 NOTE — Telephone Encounter (Signed)
Pt notified of Repatha prior approval

## 2023-02-09 ENCOUNTER — Other Ambulatory Visit: Payer: Self-pay | Admitting: Cardiology

## 2023-02-26 ENCOUNTER — Ambulatory Visit (INDEPENDENT_AMBULATORY_CARE_PROVIDER_SITE_OTHER): Payer: Medicare Other

## 2023-02-26 DIAGNOSIS — I472 Ventricular tachycardia, unspecified: Secondary | ICD-10-CM

## 2023-02-26 LAB — CUP PACEART REMOTE DEVICE CHECK
Battery Remaining Percentage: 64 %
Date Time Interrogation Session: 20241104110300
HighPow Impedance: 45 Ohm
Implantable Lead Connection Status: 753985
Implantable Lead Implant Date: 20210923
Implantable Lead Location: 753862
Implantable Lead Model: 3501
Implantable Lead Serial Number: 194255
Implantable Pulse Generator Implant Date: 20210923
Pulse Gen Serial Number: 140488

## 2023-03-26 NOTE — Progress Notes (Signed)
Remote ICD transmission.   

## 2023-05-08 ENCOUNTER — Ambulatory Visit: Payer: Medicare Other | Attending: Cardiology | Admitting: Cardiology

## 2023-05-08 ENCOUNTER — Encounter: Payer: Self-pay | Admitting: Cardiology

## 2023-05-08 ENCOUNTER — Ambulatory Visit: Payer: Medicare Other | Attending: Cardiology

## 2023-05-08 VITALS — BP 140/60 | HR 77 | Ht 72.0 in | Wt 158.0 lb

## 2023-05-08 DIAGNOSIS — Z79899 Other long term (current) drug therapy: Secondary | ICD-10-CM

## 2023-05-08 DIAGNOSIS — I472 Ventricular tachycardia, unspecified: Secondary | ICD-10-CM | POA: Diagnosis not present

## 2023-05-08 DIAGNOSIS — E782 Mixed hyperlipidemia: Secondary | ICD-10-CM

## 2023-05-08 DIAGNOSIS — I5020 Unspecified systolic (congestive) heart failure: Secondary | ICD-10-CM | POA: Diagnosis not present

## 2023-05-08 DIAGNOSIS — N186 End stage renal disease: Secondary | ICD-10-CM

## 2023-05-08 DIAGNOSIS — I2510 Atherosclerotic heart disease of native coronary artery without angina pectoris: Secondary | ICD-10-CM

## 2023-05-08 DIAGNOSIS — Z992 Dependence on renal dialysis: Secondary | ICD-10-CM

## 2023-05-08 DIAGNOSIS — R42 Dizziness and giddiness: Secondary | ICD-10-CM

## 2023-05-08 DIAGNOSIS — Z95810 Presence of automatic (implantable) cardiac defibrillator: Secondary | ICD-10-CM

## 2023-05-08 DIAGNOSIS — R030 Elevated blood-pressure reading, without diagnosis of hypertension: Secondary | ICD-10-CM

## 2023-05-08 NOTE — Progress Notes (Signed)
 Cardiology Office Note:  .   Date:  05/08/2023  ID:  James Chan, DOB 09-Jun-1950, MRN [id number] PCP: [name], MD  [facility] Providers Cardiologist:  Redell Leiter, MD    History of Present Illness: James Chan is a 73 y.o. 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  02/28/2023 device check normal 03/07/2022 echo EF 2025%, grade 3 diastolic dysfunction, elevated LA pressure, severely elevated PASP, LA mildly dilated, RA moderately dilated, mild aortic regurgitation 01/15/2020 implantation of ICD 01/15/2020 left heart cath multivessel CAD as described, 50 to 60% stenosis of the proximal LAD, CTO of distal circumflex and ostial RCA 05/12/2019 left heart cath multivessel CAD, 50 to 60% distal LMCA stenosis, 30% proximal LAD and 50% ostial D1 lesion, CTO distal left circumflex and long stented segment of the proximal through distal RCA, occlusion on the left circumflex and RCA are known to be chronic  Most recently evaluated by Dr. Leiter on 01/22/2023, he was well from a cardiac perspective, continue on low-dose amiodarone , advised to follow-up in 6 months.  He presents today with concerns of increased dizziness on exertion.  He has been feeling very poorly, feels like he is getting too  dry at dialysis .  He has had a few episodes of syncope, he cannot correlate anything with it, it typically does not occur on a HD day.  He continues to lose weight, just not feeling great overall. He denies chest pain, palpitations, dyspnea, pnd, orthopnea, n, v,  edema, weight gain, or early satiety.    ROS: Review of Systems  Constitutional:  Positive for malaise/fatigue and weight loss.  Neurological:  Positive for dizziness and loss of consciousness.  Endo/Heme/Allergies:  Bruises/bleeds easily.  All other systems reviewed and are negative.    Studies Reviewed: .         Cardiac Studies & Procedures   CARDIAC CATHETERIZATION  CARDIAC CATHETERIZATION 01/15/2020  Narrative Conclusions: 1. Multivessel coronary artery disease, overall similar in appearance to prior catheterization from January.  Distal LMCA disease remains eccentric and noncritical with approximately 50% stenosis.  There is 50 to 60% stenosis of the proximal LAD involving D1 as well as chronic total occlusions of the distal LCx and ostial RCA. 2. Moderately elevated left ventricular filling pressure (LVEDP 25-30 mmHg).  Recommendations: 1. No acute coronary lesion to explain cardiac arrest with VT/VF.  Suspect arrhythmia may have been scar mediated in the setting of reduced LVEF and chronic ischemic heart disease.  Ongoing management per EP. 2. Continue aggressive secondary prevention. 3. Restart heparin  infusion 8 hours after removal of left femoral artery sheath.  Given paroxysmal atrial fibrillation, consider transitioning from dual antiplatelet therapy to anticoagulation plus antiplatelet agent at discharge.  Lonni Hanson, MD [facility]  Findings Coronary Findings Diagnostic  Dominance: Right  Left Main Vessel is large. Dist LM lesion is 50% stenosed. The lesion is eccentric.  Left Anterior Descending Vessel is large. Prox LAD lesion is 50% stenosed.  First Diagonal Branch Vessel is large in size. 1st Diag lesion is 60% stenosed.  Left Circumflex Vessel is large. Mid Cx lesion is 30% stenosed. Mid Cx to Dist Cx lesion is 100% stenosed. The lesion is chronically occluded. The lesion was previously treated using a stent (unknown type) at an unknown date.  First Obtuse Marginal Branch Vessel is small in size.  Second Obtuse Marginal Branch Vessel is moderate in size.  Right Coronary Artery Ost RCA to Dist RCA lesion is 100% stenosed. The lesion is chronically occluded. The lesion was previously treated using a stent (unknown type) at an unknown  date.  Right Posterior Descending Artery Collaterals RPDA filled by collaterals from 2nd Sept.  Intervention  No interventions have been documented.   CARDIAC CATHETERIZATION  CARDIAC CATHETERIZATION 05/02/2019  Narrative Conclusions: 1. Multivessel coronary artery disease including 50-60% distal LMCA stenosis, 30% proximal LAD and 50% ostial D1 lesions, and chronic total occlusions of distal LCx and long stented segment of the proximal through distal RCA.  Occlusions of the LCx and RCA are known to be chronic based on prior outside catheterization reports. 2. Severely reduced left ventricular systolic function (LVEF ~30%) with mildly elevated filling pressure.  Recommendations: 1. Medical therapy of worsening cardiomyopathy complicated by wide-complex tachycardia.  Recommend formal consultation with EP. 2. Medical therapy of left main disease and LCx/RCA chronic total occlusions in the setting of acute comorbidities.  When patient has been optimized from a heart failure standpoint and COVID-19 resolved, revascularization options may need to be readdressed. 3. Optimize evidence-based heart failure therapy. 4. Consultation with internal medicine and/or critical care medicine to assist with management of COVID-19 infection.  Lonni Hanson, MD [facility]  Findings Coronary Findings Diagnostic  Dominance: Right  Left Main Vessel is large. Dist LM lesion is 55% stenosed. The lesion is eccentric.  Left Anterior Descending Vessel is large. Prox LAD lesion is 30% stenosed.  First Diagonal Branch Vessel is large in size. 1st Diag lesion is 50% stenosed.  Left Circumflex Vessel is large. Mid Cx lesion is 20% stenosed. Dist Cx lesion is 100% stenosed. The lesion is chronically occluded. The lesion was previously treated using a stent (unknown type) at an unknown date.  First Obtuse Marginal Branch Vessel is small in size.  Second Obtuse Marginal Branch Vessel is  moderate in size.  Right Coronary Artery Ost RCA to Dist RCA lesion is 100% stenosed. The lesion is chronically occluded. The lesion was previously treated using a stent (unknown type) at an unknown date.  Right Posterior Descending Artery Collaterals RPDA filled by collaterals from 2nd Sept.  Intervention  No interventions have been documented.    ECHOCARDIOGRAM  ECHOCARDIOGRAM COMPLETE 03/07/2022  Narrative ECHOCARDIOGRAM REPORT    Patient Name:   James Chan Date of Exam: 03/07/2022 Medical Rec #:  [id number]        Height:       72.0 in Accession #:    [id number]       Weight:       172.8 lb Date of Birth:  1950/11/18       BSA:          2.002 m Patient Age:    70 years         BP:           152/60 mmHg Patient Gender: M                HR:           75 bpm. Exam Location:  [place]  Procedure: 2D Echo, Cardiac Doppler, Color Doppler and Strain Analysis  Indications:    Hypertensive chronic kidney disease with stage 5 chronic kidney disease or end stage renal disease (HCC) [I12.0 (ICD-10-CM)]; Chronic diastolic heart failure (HCC) [I50.32 (ICD-10-CM)]  History:        Patient has prior history of Echocardiogram examinations,  most recent 02/15/2021. CHF, Previous Myocardial Infarction and CAD, Stroke; Risk Factors:Former Smoker, Diabetes, Hypertension and Dyslipidemia.  Sonographer:    Charlie Jointer RDCS Referring Phys: [id number] BRIAN J MUNLEY  IMPRESSIONS   1. Left ventricular ejection fraction, by estimation, is 20 to 25%. Left ventricular ejection fraction by PLAX is 24 %. The left ventricle has severely decreased function. The left ventricle demonstrates global hypokinesis. The left ventricular internal cavity size was mildly dilated. Left ventricular diastolic parameters are consistent with Grade III diastolic dysfunction (restrictive). Elevated left atrial pressure. The average left ventricular global longitudinal strain is -5.2 %. The  global longitudinal strain is abnormal. 2. Right ventricular systolic function is moderately reduced. The right ventricular size is normal. There is severely elevated pulmonary artery systolic pressure. 3. Left atrial size was mildly dilated. 4. Right atrial size was moderately dilated. 5. The mitral valve is normal in structure. Mild mitral valve regurgitation. No evidence of mitral stenosis. 6. The aortic valve is normal in structure. Aortic valve regurgitation is mild. Aortic valve sclerosis is present, with no evidence of aortic valve stenosis. 7. The inferior vena cava is dilated in size with <50% respiratory variability, suggesting right atrial pressure of 15 mmHg.  FINDINGS Left Ventricle: Left ventricular ejection fraction, by estimation, is 20 to 25%. Left ventricular ejection fraction by PLAX is 24 %. The left ventricle has severely decreased function. The left ventricle demonstrates global hypokinesis. The average left ventricular global longitudinal strain is -5.2 %. The global longitudinal strain is abnormal. The left ventricular internal cavity size was mildly dilated. There is no left ventricular hypertrophy. Left ventricular diastolic parameters are consistent with Grade III diastolic dysfunction (restrictive). Elevated left atrial pressure.  Right Ventricle: The right ventricular size is normal. No increase in right ventricular wall thickness. Right ventricular systolic function is moderately reduced. There is severely elevated pulmonary artery systolic pressure. The tricuspid regurgitant velocity is 4.30 m/s, and with an assumed right atrial pressure of 15 mmHg, the estimated right ventricular systolic pressure is 89.0 mmHg.  Left Atrium: Left atrial size was mildly dilated.  Right Atrium: Right atrial size was moderately dilated.  Pericardium: There is no evidence of pericardial effusion.  Mitral Valve: The mitral valve is normal in structure. Mild mitral valve  regurgitation. No evidence of mitral valve stenosis.  Tricuspid Valve: The tricuspid valve is normal in structure. Tricuspid valve regurgitation is mild . No evidence of tricuspid stenosis.  Aortic Valve: The aortic valve is normal in structure. Aortic valve regurgitation is mild. Aortic regurgitation PHT measures 624 msec. Aortic valve sclerosis is present, with no evidence of aortic valve stenosis.  Pulmonic Valve: The pulmonic valve was normal in structure. Pulmonic valve regurgitation is mild. No evidence of pulmonic stenosis.  Aorta: The aortic root and ascending aorta are structurally normal, with no evidence of dilitation and the aortic arch was not well visualized.  Venous: A systolic blunting flow pattern is recorded from the right upper pulmonary vein. The inferior vena cava is dilated in size with less than 50% respiratory variability, suggesting right atrial pressure of 15 mmHg.  IAS/Shunts: No atrial level shunt detected by color flow Doppler.   LEFT VENTRICLE PLAX 2D LV EF:         Left            Diastology ventricular     LV e' medial:    3.26 cm/s ejection        LV E/e' medial:  39.0 fraction by  LV e' lateral:   4.79 cm/s PLAX is 24      LV E/e' lateral: 26.5 %. LVIDd:         6.20 cm         2D LVIDs:         5.50 cm         Longitudinal LV PW:         1.30 cm         Strain LV IVS:        1.30 cm         2D Strain GLS  -5.2 % LVOT diam:     2.30 cm         Avg: LV SV:         59 LV SV Index:   29 LVOT Area:     4.15 cm   RIGHT VENTRICLE            IVC RV Basal diam:  3.90 cm    IVC diam: 1.90 cm RV Mid diam:    1.80 cm RV S prime:     7.40 cm/s TAPSE (M-mode): 2.0 cm  LEFT ATRIUM           Index        RIGHT ATRIUM           Index LA diam:      4.00 cm 2.00 cm/m   RA Area:     23.60 cm LA Vol (A4C): 70.8 ml 35.36 ml/m  RA Volume:   83.00 ml  41.45 ml/m AORTIC VALVE             PULMONIC VALVE LVOT Vmax:   63.40 cm/s  PR End Diast Vel: 8.76  msec LVOT Vmean:  47.800 cm/s LVOT VTI:    0.141 m AI PHT:      624 msec  AORTA Ao Root diam: 4.00 cm Ao Asc diam:  3.50 cm Ao Desc diam: 2.20 cm  MITRAL VALVE                TRICUSPID VALVE MV Area (PHT): 6.71 cm     TR Peak grad:   74.0 mmHg MV Decel Time: 113 msec     TR Vmax:        430.00 cm/s MR Peak grad: 59.0 mmHg MR Vmax:      384.00 cm/s   SHUNTS MV E velocity: 127.00 cm/s  Systemic VTI:  0.14 m MV A velocity: 66.10 cm/s   Systemic Diam: 2.30 cm MV E/A ratio:  1.92  Redell Leiter MD Electronically signed by Redell Leiter MD Signature Date/Time: 03/07/2022/5:31:38 PM    Final   MONITORS  LONG TERM MONITOR (3-14 DAYS) 07/19/2020  Narrative Patch Wear Time:  6 days and 23 hours (2022-03-15T16:04:22-0400 to 2022-03-22T15:05:24-0400)  Patient had a min HR of 52 bpm, max HR of 128 bpm, and avg HR of 67 bpm. Predominant underlying rhythm was Sinus Rhythm. Bundle Branch Block/IVCD was present. 1 run of Ventricular Tachycardia occurred lasting 4 beats with a max rate of 128 bpm (avg 104 bpm). 3 Supraventricular Tachycardia runs occurred, the run with the fastest interval lasting 4 beats with a max rate of 124 bpm, the longest lasting 4 beats with an avg rate of 107 bpm. Isolated SVEs were rare (<1.0%), SVE Couplets were rare (<1.0%), and SVE Triplets were rare (<1.0%). Isolated VEs were rare (<1.0%), VE Couplets were rare (<1.0%), and no VE Triplets were present.  The rhythm was  sinus with minimum average maximal heart rate 52, 67 and 85 bpm.  There was one 4 beat run of PVCs present. Ventricular ectopy was rare with PVCs and couplets. Supraventricular ectopy was rare with no episodes of atrial fibrillation or flutter. No pauses of 3 seconds or greater and no episodes of second or third-degree AV block or sinus node exit block. Triggered and symptomatic events were associated with supraventricular ectopy.  Conclusion absence of significant bradycardia rare ventricular and  supraventricular ectopy.           Risk Assessment/Calculations:    CHA2DS2-VASc Score =     This indicates a  % annual risk of stroke. The patient's score is based upon:          Physical Exam:   VS:  BP (!) 140/60 (BP Location: Left Arm, Patient Position: Sitting, Cuff Size: Normal)   Pulse 77   Ht 6' (1.829 m)   Wt 158 lb (71.7 kg)   SpO2 94%   BMI 21.43 kg/m    Wt Readings from Last 3 Encounters:  05/08/23 158 lb (71.7 kg)  01/22/23 161 lb 3.2 oz (73.1 kg)  02/16/22 172 lb 12.8 oz (78.4 kg)    GEN: Chronically ill-appearing, no acute distress NECK: No JVD; No carotid bruits CARDIAC: RRR, no murmurs, rubs, gallops RESPIRATORY:  Clear to auscultation without rales, wheezing or rhonchi  ABDOMEN: Soft, non-tender, non-distended EXTREMITIES:  No edema; No deformity   ASSESSMENT AND PLAN: .   Dizziness/syncope-he has been feeling persistently bad for the last few months, has had 2 episodes of syncope.  I discussed with his primary cardiologist Dr. Monetta, he evaluated him at the bedside, concerned that he could be having arrhythmias.  He does have a subcutaneous ICD, denies any shocks. Arrange for a live 2 week monitor.  NSVT/on amiodarone  therapy/presence of ICD-again denies any shocks, states he is occasionally bothered by palpitations, 2 recent syncopal events.  Will arrange for 2-week monitor.  Hypotension -this is been an ongoing issue since he has been on dialysis, he had needed midodrine  in the past however it appears this was stopped as he was having episodes of hypotension during dialysis.  Will have him wear a blood pressure monitor for 24 hours.  CAD-known CTO of RCA and left circumflex. Stable with no anginal symptoms. No indication for ischemic evaluation.    HFrEF-NYHA class II-III, volume status managed by hemodialysis.  Continue Demadex  100 mg 4 times a week, further GDMT is prohibited secondary to kidney dysfunction.  ESRD on HD-Monday Wednesday Friday, labs  are monitored by his nephrologist.        Dispo: 2-week live monitor, 24-hour blood pressure cuff, follow-up with Dr. Monetta in 1 month.  Signed, Delon JAYSON Hoover, NP

## 2023-05-08 NOTE — Patient Instructions (Signed)
 Medication Instructions:  Your physician recommends that you continue on your current medications as directed. Please refer to the Current Medication list given to you today.  *If you need a refill on your cardiac medications before your next appointment, please call your pharmacy*   Lab Work: NONE If you have labs (blood work) drawn today and your tests are completely normal, you will receive your results only by: MyChart Message (if you have MyChart) OR A paper copy in the mail If you have any lab test that is abnormal or we need to change your treatment, we will call you to review the results.   Testing/Procedures: You have been asked to wear a Zio Heart Monitor today. It is to be worn for 14 days. Please remove the monitor on 05/22/23 and mail back in the box provided.  If you have any questions about the monitor please call the company at [phone]     Follow-Up: At [facility], you and your health needs are our priority.  As part of our continuing mission to provide you with exceptional heart care, we have created designated Provider Care Teams.  These Care Teams include your primary Cardiologist (physician) and Advanced Practice Providers (APPs -  Physician Assistants and Nurse Practitioners) who all work together to provide you with the care you need, when you need it.  We recommend signing up for the patient portal called MyChart.  Sign up information is provided on this After Visit Summary.  MyChart is used to connect with patients for Virtual Visits (Telemedicine).  Patients are able to view lab/test results, encounter notes, upcoming appointments, etc.  Non-urgent messages can be sent to your provider as well.   To learn more about what you can do with MyChart, go to [email or web address].    Your next appointment:   1 month(s)  Provider:   Redell Leiter, MD    Other Instructions

## 2023-05-11 ENCOUNTER — Ambulatory Visit: Payer: Medicare Other | Attending: Cardiology

## 2023-05-11 DIAGNOSIS — R030 Elevated blood-pressure reading, without diagnosis of hypertension: Secondary | ICD-10-CM

## 2023-05-28 ENCOUNTER — Ambulatory Visit (INDEPENDENT_AMBULATORY_CARE_PROVIDER_SITE_OTHER): Payer: Medicare Other

## 2023-05-28 DIAGNOSIS — I5020 Unspecified systolic (congestive) heart failure: Secondary | ICD-10-CM

## 2023-05-28 DIAGNOSIS — I472 Ventricular tachycardia, unspecified: Secondary | ICD-10-CM

## 2023-05-29 LAB — CUP PACEART REMOTE DEVICE CHECK
Battery Remaining Percentage: 61 %
Date Time Interrogation Session: 20250203101500
HighPow Impedance: 45 Ohm
Implantable Lead Connection Status: 753985
Implantable Lead Implant Date: 20210923
Implantable Lead Location: 753862
Implantable Lead Model: 3501
Implantable Lead Serial Number: 194255
Implantable Pulse Generator Implant Date: 20210923
Pulse Gen Serial Number: 140488

## 2023-06-07 ENCOUNTER — Telehealth: Payer: Self-pay | Admitting: Emergency Medicine

## 2023-06-07 NOTE — Telephone Encounter (Signed)
-----   Message from Flossie Dibble sent at 06/04/2023  9:17 AM EST ----- Monitor revealed that you are predominantly in sinus rhythm.  Triggered events for symptoms were associated with sinus rhythm or 1 extra beat--this is normal.  You did have extra beats occurring from the bottom part of the heart, as well as from the top part of the heart -- we already knew about the beats from the bottom part of the heart as he has an ICD and is on amiodarone.   He will see Dr. Dulce Sellar in ~ 2 weeks, they can discuss further. Monitor did not reveal anything not already known.

## 2023-06-07 NOTE — Telephone Encounter (Signed)
Results reviewed with pt as per Wallis Bamberg NP's note.  Pt verbalized understanding and had no additional questions. Routed to PCP.

## 2023-06-13 NOTE — Progress Notes (Signed)
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 2023 15:01:11 EST Ventricular Rate:  64 PR Interval:  198 QRS Duration:  124 QT Interval:  482 QTC Calculation: 497 R Axis:   95  Text Interpretation: ST and T abnormality most consistent with repolarization changes Normal sinus rhythm Rightward axis Cannot rule out Inferior infarct (cited on or before 08-May-2023) No significant change since last tracing Abnormal ECG When compared with ECG of 08-May-2023 15:30, Nonspecific T wave abnormality now evident in Anterior leads Confirmed by James Chan ([id number]) on 06/18/2023 3:09:58 PM   Recent Labs: No results found for requested labs within last 365 days.  Recent Lipid Panel    Component Value Date/Time   CHOL 180 01/15/2020 0406   CHOL 155 12/05/2018 1010   TRIG 175 (H) 01/15/2020 0406   HDL 28 (L) 01/15/2020 0406   HDL 40 12/05/2018 1010   CHOLHDL 6.4 01/15/2020  0406   VLDL 35 01/15/2020 0406   LDLCALC 117 (H) 01/15/2020 0406   LDLCALC 97 12/05/2018 1010    Physical Exam:    VS:  BP (!) 110/46   Pulse 64   Ht 6' (1.829 m)   Wt 156 lb (70.8 kg)   SpO2 96%   BMI 21.16 kg/m     Wt Readings from Last 3 Encounters:  06/18/23 156 lb (70.8 kg)  05/08/23 158 lb (71.7 kg)  01/22/23 161 lb 3.2 oz (73.1 kg)     GEN: He looks very frail chronically ill poor muscle mass well nourished, well developed in no acute distress HEENT: Normal NECK: No JVD; No carotid bruits LYMPHATICS: No lymphadenopathy CARDIAC: RRR, no murmurs, rubs, gallops RESPIRATORY:  Clear to auscultation without rales, wheezing or rhonchi  ABDOMEN: Soft, non-tender, non-distended MUSCULOSKELETAL:  No edema; No deformity  SKIN: Warm and dry NEUROLOGIC:  Alert and oriented x 3 PSYCHIATRIC:  Normal affect    Signed, James Herrlich, MD  06/18/2023 3:27 PM    [facility]

## 2023-06-14 ENCOUNTER — Other Ambulatory Visit: Payer: Self-pay

## 2023-06-18 ENCOUNTER — Encounter: Payer: Self-pay | Admitting: Cardiology

## 2023-06-18 ENCOUNTER — Ambulatory Visit: Payer: Medicare Other | Attending: Cardiology | Admitting: Cardiology

## 2023-06-18 VITALS — BP 110/46 | HR 64 | Ht 72.0 in | Wt 156.0 lb

## 2023-06-18 DIAGNOSIS — Z992 Dependence on renal dialysis: Secondary | ICD-10-CM

## 2023-06-18 DIAGNOSIS — Z79899 Other long term (current) drug therapy: Secondary | ICD-10-CM

## 2023-06-18 DIAGNOSIS — I5022 Chronic systolic (congestive) heart failure: Secondary | ICD-10-CM

## 2023-06-18 DIAGNOSIS — I951 Orthostatic hypotension: Secondary | ICD-10-CM

## 2023-06-18 DIAGNOSIS — I739 Peripheral vascular disease, unspecified: Secondary | ICD-10-CM

## 2023-06-18 DIAGNOSIS — I110 Hypertensive heart disease with heart failure: Secondary | ICD-10-CM

## 2023-06-18 DIAGNOSIS — E782 Mixed hyperlipidemia: Secondary | ICD-10-CM

## 2023-06-18 DIAGNOSIS — I472 Ventricular tachycardia, unspecified: Secondary | ICD-10-CM

## 2023-06-18 DIAGNOSIS — Z95810 Presence of automatic (implantable) cardiac defibrillator: Secondary | ICD-10-CM

## 2023-06-18 DIAGNOSIS — R42 Dizziness and giddiness: Secondary | ICD-10-CM | POA: Diagnosis not present

## 2023-06-18 DIAGNOSIS — I2510 Atherosclerotic heart disease of native coronary artery without angina pectoris: Secondary | ICD-10-CM

## 2023-06-18 DIAGNOSIS — N186 End stage renal disease: Secondary | ICD-10-CM

## 2023-06-18 NOTE — Patient Instructions (Signed)
 Medication Instructions:  Your physician has recommended you make the following change in your medication:   STOP: Hydralazine  *If you need a refill on your cardiac medications before your next appointment, please call your pharmacy*   Lab Work: None If you have labs (blood work) drawn today and your tests are completely normal, you will receive your results only by: MyChart Message (if you have MyChart) OR A paper copy in the mail If you have any lab test that is abnormal or we need to change your treatment, we will call you to review the results.   Testing/Procedures: Your physician has requested that you have an echocardiogram. Echocardiography is a painless test that uses sound waves to create images of your heart. It provides your doctor with information about the size and shape of your heart and how well your heart's chambers and valves are working. This procedure takes approximately one hour. There are no restrictions for this procedure. Please do NOT wear cologne, perfume, aftershave, or lotions (deodorant is allowed). Please arrive 15 minutes prior to your appointment time.  Please note: We ask at that you not bring children with you during ultrasound (echo/ vascular) testing. Due to room size and safety concerns, children are not allowed in the ultrasound rooms during exams. Our front office staff cannot provide observation of children in our lobby area while testing is being conducted. An adult accompanying a patient to their appointment will only be allowed in the ultrasound room at the discretion of the ultrasound technician under special circumstances. We apologize for any inconvenience.    Follow-Up: At [facility], you and your health needs are our priority.  As part of our continuing mission to provide you with exceptional heart care, we have created designated Provider Care Teams.  These Care Teams include your primary Cardiologist (physician) and Advanced  Practice Providers (APPs -  Physician Assistants and Nurse Practitioners) who all work together to provide you with the care you need, when you need it.  We recommend signing up for the patient portal called "MyChart".  Sign up information is provided on this After Visit Summary.  MyChart is used to connect with patients for Virtual Visits (Telemedicine).  Patients are able to view lab/test results, encounter notes, upcoming appointments, etc.  Non-urgent messages can be sent to your provider as well.   To learn more about what you can do with MyChart, go to [email or web address].    Your next appointment:   3 month(s)  Provider:   Norman Herrlich, MD    Other Instructions None

## 2023-07-04 NOTE — Progress Notes (Signed)
 Remote ICD transmission.

## 2023-07-04 NOTE — Addendum Note (Signed)
 Addended by: Geralyn Flash D on: 07/04/2023 04:38 PM   Modules accepted: Orders

## 2023-07-10 ENCOUNTER — Other Ambulatory Visit: Payer: Medicare Other

## 2023-07-13 ENCOUNTER — Ambulatory Visit: Payer: Medicare Other | Attending: Cardiology

## 2023-07-13 DIAGNOSIS — I472 Ventricular tachycardia, unspecified: Secondary | ICD-10-CM | POA: Diagnosis not present

## 2023-07-13 DIAGNOSIS — I110 Hypertensive heart disease with heart failure: Secondary | ICD-10-CM

## 2023-07-13 DIAGNOSIS — I739 Peripheral vascular disease, unspecified: Secondary | ICD-10-CM

## 2023-07-13 DIAGNOSIS — Z79899 Other long term (current) drug therapy: Secondary | ICD-10-CM

## 2023-07-13 DIAGNOSIS — I5022 Chronic systolic (congestive) heart failure: Secondary | ICD-10-CM

## 2023-07-13 DIAGNOSIS — I951 Orthostatic hypotension: Secondary | ICD-10-CM

## 2023-07-13 DIAGNOSIS — Z95810 Presence of automatic (implantable) cardiac defibrillator: Secondary | ICD-10-CM

## 2023-07-13 DIAGNOSIS — R42 Dizziness and giddiness: Secondary | ICD-10-CM | POA: Diagnosis not present

## 2023-07-13 DIAGNOSIS — I2510 Atherosclerotic heart disease of native coronary artery without angina pectoris: Secondary | ICD-10-CM

## 2023-07-13 DIAGNOSIS — E782 Mixed hyperlipidemia: Secondary | ICD-10-CM

## 2023-07-13 DIAGNOSIS — N186 End stage renal disease: Secondary | ICD-10-CM

## 2023-07-13 DIAGNOSIS — Z992 Dependence on renal dialysis: Secondary | ICD-10-CM

## 2023-07-13 LAB — ECHOCARDIOGRAM COMPLETE
AR max vel: 2.01 cm2
AV Area VTI: 2.03 cm2
AV Area mean vel: 1.97 cm2
AV Mean grad: 3 mmHg
AV Peak grad: 6.1 mmHg
Ao pk vel: 1.23 m/s
Area-P 1/2: 3.6 cm2
Calc EF: 35.3 %
MV M vel: 3.28 m/s
MV Peak grad: 43 mmHg
MV VTI: 1.7 cm2
S' Lateral: 5.1 cm
Single Plane A2C EF: 43.7 %
Single Plane A4C EF: 33.7 %

## 2023-08-27 ENCOUNTER — Ambulatory Visit (INDEPENDENT_AMBULATORY_CARE_PROVIDER_SITE_OTHER): Payer: Medicare Other

## 2023-08-27 DIAGNOSIS — I472 Ventricular tachycardia, unspecified: Secondary | ICD-10-CM | POA: Diagnosis not present

## 2023-08-27 DIAGNOSIS — I5020 Unspecified systolic (congestive) heart failure: Secondary | ICD-10-CM

## 2023-08-27 LAB — CUP PACEART REMOTE DEVICE CHECK
Battery Remaining Percentage: 57 %
Date Time Interrogation Session: 20250505100800
HighPow Impedance: 50 Ohm
Implantable Lead Connection Status: 753985
Implantable Lead Implant Date: 20210923
Implantable Lead Location: 753862
Implantable Lead Model: 3501
Implantable Lead Serial Number: 194255
Implantable Pulse Generator Implant Date: 20210923
Pulse Gen Serial Number: 140488

## 2023-09-18 ENCOUNTER — Ambulatory Visit: Attending: Cardiology | Admitting: Cardiology

## 2023-09-18 ENCOUNTER — Ambulatory Visit: Payer: Medicare Other | Admitting: Cardiology

## 2023-09-18 ENCOUNTER — Encounter (HOSPITAL_BASED_OUTPATIENT_CLINIC_OR_DEPARTMENT_OTHER): Payer: Self-pay

## 2023-09-18 VITALS — BP 134/72 | HR 75 | Ht 72.0 in | Wt 151.0 lb

## 2023-09-18 DIAGNOSIS — I779 Disorder of arteries and arterioles, unspecified: Secondary | ICD-10-CM

## 2023-09-18 DIAGNOSIS — I5042 Chronic combined systolic (congestive) and diastolic (congestive) heart failure: Secondary | ICD-10-CM | POA: Diagnosis not present

## 2023-09-18 DIAGNOSIS — Z79899 Other long term (current) drug therapy: Secondary | ICD-10-CM

## 2023-09-18 DIAGNOSIS — Z992 Dependence on renal dialysis: Secondary | ICD-10-CM

## 2023-09-18 DIAGNOSIS — N186 End stage renal disease: Secondary | ICD-10-CM

## 2023-09-18 DIAGNOSIS — I340 Nonrheumatic mitral (valve) insufficiency: Secondary | ICD-10-CM | POA: Diagnosis not present

## 2023-09-18 DIAGNOSIS — I4901 Ventricular fibrillation: Secondary | ICD-10-CM | POA: Diagnosis not present
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
## 2023-09-18 NOTE — Patient Instructions (Signed)
Medication Instructions:  Your physician recommends that you continue on your current medications as directed. Please refer to the Current Medication list given to you today.  *If you need a refill on your cardiac medications before your next appointment, please call your pharmacy*   Lab Work: None Ordered If you have labs (blood work) drawn today and your tests are completely normal, you will receive your results only by: MyChart Message (if you have MyChart) OR A paper copy in the mail If you have any lab test that is abnormal or we need to change your treatment, we will call you to review the results.   Testing/Procedures: Your physician has requested that you have a carotid duplex. This test is an ultrasound of the carotid arteries in your neck. It looks at blood flow through these arteries that supply the brain with blood. Allow one hour for this exam. There are no restrictions or special instructions.    Follow-Up: At [facility], you and your health needs are our priority.  As part of our continuing mission to provide you with exceptional heart care, we have created designated Provider Care Teams.  These Care Teams include your primary Cardiologist (physician) and Advanced Practice Providers (APPs -  Physician Assistants and Nurse Practitioners) who all work together to provide you with the care you need, when you need it.  We recommend signing up for the patient portal called "MyChart".  Sign up information is provided on this After Visit Summary.  MyChart is used to connect with patients for Virtual Visits (Telemedicine).  Patients are able to view lab/test results, encounter notes, upcoming appointments, etc.  Non-urgent messages can be sent to your provider as well.   To learn more about what you can do with MyChart, go to [email or web address].    Your next appointment:   5 month(s)  The format for your next appointment:   In Person  Provider:   Robert Krasowski, MD     Other Instructions NA  

## 2023-09-25 ENCOUNTER — Ambulatory Visit (HOSPITAL_BASED_OUTPATIENT_CLINIC_OR_DEPARTMENT_OTHER)
Admission: RE | Admit: 2023-09-25 | Discharge: 2023-09-25 | Disposition: A | Discharge status: Home / Self Care | Source: Ambulatory Visit | Attending: Cardiology | Admitting: Cardiology

## 2023-09-25 DIAGNOSIS — R42 Dizziness and giddiness: Secondary | ICD-10-CM

## 2023-09-25 DIAGNOSIS — I779 Disorder of arteries and arterioles, unspecified: Secondary | ICD-10-CM

## 2023-09-25 DIAGNOSIS — H538 Other visual disturbances: Secondary | ICD-10-CM

## 2023-10-08 ENCOUNTER — Telehealth: Payer: Self-pay

## 2023-10-08 ENCOUNTER — Ambulatory Visit: Attending: Cardiology | Admitting: Cardiology

## 2023-10-08 ENCOUNTER — Encounter: Admitting: Cardiovascular Disease

## 2023-10-08 ENCOUNTER — Ambulatory Visit: Payer: Self-pay | Admitting: Cardiology

## 2023-10-08 ENCOUNTER — Encounter: Payer: Self-pay | Admitting: Cardiology

## 2023-10-08 VITALS — BP 118/72 | HR 57 | Ht 66.0 in | Wt 152.6 lb

## 2023-10-08 DIAGNOSIS — Z79899 Other long term (current) drug therapy: Secondary | ICD-10-CM

## 2023-10-08 DIAGNOSIS — I4901 Ventricular fibrillation: Secondary | ICD-10-CM | POA: Diagnosis not present

## 2023-10-08 DIAGNOSIS — I5022 Chronic systolic (congestive) heart failure: Secondary | ICD-10-CM

## 2023-10-08 LAB — CUP PACEART INCLINIC DEVICE CHECK
Date Time Interrogation Session: 20250616171718
Implantable Lead Connection Status: 753985
Implantable Lead Implant Date: 20210923
Implantable Lead Location: 753862
Implantable Lead Model: 3501
Implantable Lead Serial Number: 194255
Implantable Pulse Generator Implant Date: 20210923
Pulse Gen Serial Number: 140488

## 2023-10-08 MED ORDER — MEXILETINE HCL 250 MG PO CAPS: 250.0000 mg | ORAL_CAPSULE | Freq: Two times a day (BID) | ORAL | 3 refills | Status: DC

## 2023-10-08 NOTE — Telephone Encounter (Signed)
Follow Up:     Patient is returning call from this morning. 

## 2023-10-08 NOTE — Addendum Note (Signed)
 Addended by: Alvenia Aus on: 10/08/2023 04:51 PM   Modules accepted: Orders

## 2023-10-08 NOTE — Progress Notes (Deleted)
  Electrophysiology Office Note:    Date:  10/08/2023   ID:  James Chan, DOB 09-10-1950, MRN [id number]  PCP:  James Barer, MD   [facility] Providers Cardiologist:  James Hinds, MD { Click to update primary MD,subspecialty MD or APP then REFRESH:1}    Referring MD: James Barer, MD   History of Present Illness:    James Chan is a 73 y.o. 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     He is seen today after receiving a shock from his device ***.  He was admitted in January 2022 with an episode of syncope resulting in a inferior left orbital wall fracture.  He had an ICD present at the time; no arrhythmia was detected.  Metoprolol  was discontinued on discharge.  I reviewed notes by Drs. Krasowski and Whitehall.  The patient has continued to follow-up with episodes of dizziness and lightheadedness thought to be related to low blood pressure.  Hydralazine  was discontinued and he had been started on midodrine  with some improvement.  Discussed the use of AI scribe software for clinical note transcription with the patient, who gave verbal consent to proceed.  History of Present Illness          Today, ***  EKGs/Labs/Other Studies Reviewed Today:     Echocardiogram:  TTE July 13, 2023 LVEF 30 to 35%.  Grade 2 diastolic dysfunction.  Low normal RV function.  Severely elevated PA pressure.  Moderately dilated biatrial size.   Monitors:  *** day monitor ***  -- my interpretation ***  Stress testing:  *** ***  Advanced imaging:  *** ***  Cardiac catherization  *** ***  EKG:         Physical Exam:    VS:  There were no vitals taken for this visit.    Wt Readings from Last 3 Encounters:  09/18/23 151 lb  (68.5 kg)  06/18/23 156 lb (70.8 kg)  05/08/23 158 lb (71.7 kg)     GEN: *** Well nourished, well developed in no acute distress CARDIAC: ***RRR, no murmurs, rubs, gallops RESPIRATORY:  Normal work of breathing MUSCULOSKELETAL: *** edema    ASSESSMENT & PLAN:     *** ***  *** ***  *** ***  *** ***  *** ***    Signed, Efraim Grange, MD  10/08/2023 9:57 AM    [facility]

## 2023-10-08 NOTE — Patient Instructions (Signed)
 Medication Instructions:  Your physician has recommended you make the following change in your medication:  START Mexiletine 250 mg twice daily  *If you need a refill on your cardiac medications before your next appointment, please call your pharmacy*  Lab Work: Today:  BMET & Magnesium   If you have any lab test that is abnormal or we need to change your treatment, we will call you to review the results.  Testing/Procedures: None ordered  Follow-Up: At [facility], you and your health needs are our priority.  As part of our continuing mission to provide you with exceptional heart care, our providers are all part of one team.  This team includes your primary Cardiologist (physician) and Advanced Practice Providers or APPs (Physician Assistants and Nurse Practitioners) who all work together to provide you with the care you need, when you need it.  Your next appointment:   3 month(s)  Provider:   Agatha Horsfall, MD    Thank you for choosing [facility]!!   Reece Cane, RN [phone]   Other Instructions  Mexiletine Capsules What is this medication? MEXILETINE (mex IL e teen) treats a fast or irregular heartbeat (arrhythmia). It works by slowing down overactive electric signals in the heart, which stabilizes your heart rhythm. It belongs to a group of medications called antiarrhythmics. This medicine may be used for other purposes; ask your health care provider or pharmacist if you have questions. COMMON BRAND NAME(S): Mexitil What should I tell my care team before I take this medication? They need to know if you have any of these conditions: Liver disease Other heart problems Previous heart attack An unusual or allergic reaction to mexiletine, other medications, foods, dyes, or preservatives Pregnant or trying to get pregnant Breast-feeding How should I use this medication? Take this medication by mouth with a glass of water. Follow the directions on the  prescription label. It is recommended that you take this medication with food or an antacid. Take your doses at regular intervals. Do not take your medication more often than directed. Do not stop taking except on the advice of your care team. Talk to your care team about the use of this medication in children. Special care may be needed. Overdosage: If you think you have taken too much of this medicine contact a poison control center or emergency room at once. NOTE: This medicine is only for you. Do not share this medicine with others. What if I miss a dose? If you miss a dose, take it as soon as you can. If it is almost time for your next dose, take only that dose. Do not take double or extra doses. What may interact with this medication? Do not take this medication with any of the following: Dofetilide This medication may also interact with the following: Caffeine Cimetidine Medications for depression, anxiety, or other mental health conditions Medications for irregular heartbeat Phenobarbital Phenytoin Rifampin Theophylline This list may not describe all possible interactions. Give your health care provider a list of all the medicines, herbs, non-prescription drugs, or dietary supplements you use. Also tell them if you smoke, drink alcohol, or use illegal drugs. Some items may interact with your medicine. What should I watch for while using this medication? Your condition will be monitored carefully when you first begin therapy. Often, this medication is first started in a hospital or other monitored health care setting. Once you are on maintenance therapy, visit your care team for regular checks on your progress. Because your  condition and use of this medication carry some risk, it is a good idea to carry an identification card, necklace, or bracelet with details of your condition, medications, and care team. This medication may affect your coordination, reaction time, or judgment. Do not  drive or operate machinery until you know how this medication affects you. Sit up or stand slowly to reduce the risk of dizzy or fainting spells. Drinking alcohol with this medication can increase the risk of these side effects. This medication may cause serious skin reactions. They can happen weeks to months after starting the medication. Contact your care team right away if you notice fevers or flu-like symptoms with a rash. The rash may be red or purple and then turn into blisters or peeling of the skin. You may also notice a red rash with swelling of the face, lips, or lymph nodes in your neck or under your arms. What side effects may I notice from receiving this medication? Side effects that you should report to your care team as soon as possible: Allergic reactions--skin rash, itching, hives, swelling of the face, lips, tongue, or throat Heart rhythm changes--fast or irregular heartbeat, dizziness, feeling faint or lightheaded, chest pain, trouble breathing Infection--fever, chills, cough, or sore throat Liver injury--right upper belly pain, loss of appetite, nausea, light-colored stool, dark yellow or brown urine, yellowing skin or eyes, unusual weakness or fatigue Rash, fever, and swollen lymph nodes Seizures Unusual bruising or bleeding Side effects that usually do not require medical attention (report to your care team if they continue or are bothersome): Anxiety, nervousness Blurry vision Dizziness Headache Heartburn Nausea Tremors or shaking Vomiting This list may not describe all possible side effects. Call your doctor for medical advice about side effects. You may report side effects to FDA at 1-800-FDA-1088. Where should I keep my medication? Keep out of reach of children. Store at room temperature between 15 and 30 degrees C (59 and 86 degrees F). Throw away any unused medication after the expiration date. NOTE: This sheet is a summary. It may not cover all possible  information. If you have questions about this medicine, talk to your doctor, pharmacist, or health care provider.  2024 Elsevier/Gold Standard (2022-10-05 00:00:00)

## 2023-10-08 NOTE — Telephone Encounter (Signed)
 Alert received from [employer] for  6 treated events on 6/13 between 0281-1811.  Longest x 38 sec.  WCT with intermittent undersensing.  A total of 6 ICD shocks delivered (one for each event).  Sending to triage.   Patient contacted via phone. States he was not aware of shocks and reports feeling well 10/05/23 and throughout the weekend. Patient is currently in dialysis so unable to discuss further. Apt open today with Dr. Arlester Ladd at 3:45.   Patient advised no driving per [place] DMV x6 months/shock plan. Pt voiced understanding.

## 2023-10-08 NOTE — Progress Notes (Signed)
 Electrophysiology Office Note:   Date:  10/08/2023  ID:  James Chan, DOB March 15, 1951, MRN [id number]  Primary Cardiologist: Zoe Hinds, MD Primary Heart Failure: None Electrophysiologist: Gloriana Piltz Cortland Ding, MD      History of Present Illness:   James Chan is a 73 y.o. male with h/o VT/VF, hypertension, chronic systolic heart failure, CKD on dialysis, hyperlipidemia seen today for routine electrophysiology followup.   Since last being seen in our clinic the patient reports doing well other than some fatigue and shortness of breath.  He continues to do his daily activities without restriction.  He gets dialysis Monday Wednesday Friday.  This past Friday, he had multiple episodes of VT/VF requiring 6 ICD shocks.  The patient states that he was in his usual state of health and does not remember the episodes.  He has had no further episodes since that time.  He did have dialysis today, and had no issues with arrhythmia.  he denies chest pain, palpitations, dyspnea, PND, orthopnea, nausea, vomiting, dizziness, syncope, edema, weight gain, or early satiety.   Review of systems complete and found to be negative unless listed in HPI.      EP Information / Studies Reviewed:    EKG is ordered today. Personal review as below.  EKG Interpretation Date/Time:  Monday October 08 2023 16:05:18 EDT Ventricular Rate:  57 PR Interval:  208 QRS Duration:  128 QT Interval:  486 QTC Calculation: 473 R Axis:   83  Text Interpretation: Sinus bradycardia Left ventricular hypertrophy with QRS widening and repolarization abnormality Abnormal ECG When compared with ECG of 18-Sep-2023 13:35, No significant change was found Confirmed by Brita Jurgensen ([id number]) on 10/08/2023 4:28:19 PM   ICD Interrogation-  reviewed in detail today,  See PACEART report.  Device History: Architect ICD implanted CHF for VT/VF arrest and CHF History of appropriate therapy: Yes History of AAD therapy: Yes;  currently on amiodarone    Risk Assessment/Calculations:              Physical Exam:   VS:  BP 118/72   Pulse (!) 57   Ht 5' 6 (1.676 m)   Wt 152 lb 9.6 oz (69.2 kg)   SpO2 96%   BMI 24.63 kg/m    Wt Readings from Last 3 Encounters:  10/08/23 152 lb 9.6 oz (69.2 kg)  09/18/23 151 lb (68.5 kg)  06/18/23 156 lb (70.8 kg)     GEN: Well nourished, well developed in no acute distress NECK: No JVD; No carotid bruits CARDIAC: Regular rate and rhythm, 2/6 systolic murmur at the base RESPIRATORY:  Clear to auscultation without rales, wheezing or rhonchi  ABDOMEN: Soft, non-tender, non-distended EXTREMITIES:  No edema; No deformity   ASSESSMENT AND PLAN:    VT/VF arrest s/p [employer] S-ICD  euvolemic today Stable on an appropriate medical regimen Normal ICD function Programming appropriate See Valeta Gaudier Art report Patient is post 6 episodes of VT/VF treated with ICD shock.  Patient was unaware of these episodes. On amiodarone  200 mg daily.  Relena Ivancic add mexiletine 250 mg twice daily.  Keoki Mchargue check BMP/magnesium . Patient advised no driving for 6 months  2.  Chronic systolic heart failure: Medical therapy limited by end-stage renal disease and hypotension.  Plan per primary cardiology.  3.  Peripheral arterial disease: Carotid ultrasounds with moderate disease.  Management per primary cardiology  4.  Nonrheumatic mitral regurgitation: Plan per primary cardiology  Disposition:   Follow up with Dr. Lawana Pray in 3 months  Signed, Hartley Wyke Cortland Ding, MD

## 2023-10-08 NOTE — Telephone Encounter (Signed)
 Returned call to Pt.  Pt is unable to keep appointment scheduled for today in [place].  Pt states he just got home from dialysis and will not be able to make it.  Per Pt's chart, he lives near [place].  Dr. Lawana Pray has an opening today at 4:00 pm.  Pt advised of opening in [place] and is agreeable.  Reiterated driving restrictions.  Pt understands.

## 2023-10-09 ENCOUNTER — Ambulatory Visit: Payer: Self-pay

## 2023-10-09 LAB — COMPREHENSIVE METABOLIC PANEL WITH GFR
ALT: 14 IU/L (ref 0–44)
AST: 18 IU/L (ref 0–40)
Albumin: 3.7 g/dL — ABNORMAL LOW (ref 3.9–4.9)
Alkaline Phosphatase: 112 IU/L (ref 44–121)
BUN/Creatinine Ratio: 6 — ABNORMAL LOW (ref 10–24)
BUN: 18 mg/dL (ref 8–27)
Bilirubin Total: 1 mg/dL (ref 0.0–1.2)
CO2: 27 mmol/L (ref 20–29)
Calcium: 8.8 mg/dL (ref 8.6–10.2)
Chloride: 97 mmol/L (ref 96–106)
Creatinine, Ser: 2.79 mg/dL — ABNORMAL HIGH (ref 0.76–1.27)
Globulin, Total: 2.5 g/dL (ref 1.5–4.5)
Glucose: 120 mg/dL — ABNORMAL HIGH (ref 70–99)
Potassium: 3.7 mmol/L (ref 3.5–5.2)
Sodium: 140 mmol/L (ref 134–144)
Total Protein: 6.2 g/dL (ref 6.0–8.5)
eGFR: 24 mL/min/{1.73_m2} — ABNORMAL LOW (ref >59)

## 2023-10-09 LAB — MAGNESIUM: Magnesium: 2.3 mg/dL (ref 1.6–2.3)

## 2023-10-09 LAB — TSH: TSH: 1.46 u[IU]/mL (ref 0.450–4.500)

## 2023-10-14 ENCOUNTER — Emergency Department (HOSPITAL_COMMUNITY)

## 2023-10-14 ENCOUNTER — Inpatient Hospital Stay (HOSPITAL_COMMUNITY)
Admission: EM | Admit: 2023-10-14 | Discharge: 2023-10-24 | DRG: 321 | Disposition: A | Discharge status: Home / Self Care | Attending: Cardiology | Admitting: Cardiology

## 2023-10-14 ENCOUNTER — Other Ambulatory Visit: Payer: Self-pay

## 2023-10-14 ENCOUNTER — Encounter (HOSPITAL_COMMUNITY): Payer: Self-pay

## 2023-10-14 DIAGNOSIS — E039 Hypothyroidism, unspecified: Secondary | ICD-10-CM | POA: Diagnosis present

## 2023-10-14 DIAGNOSIS — Z66 Do not resuscitate: Secondary | ICD-10-CM | POA: Diagnosis present

## 2023-10-14 DIAGNOSIS — I5043 Acute on chronic combined systolic (congestive) and diastolic (congestive) heart failure: Secondary | ICD-10-CM | POA: Diagnosis present

## 2023-10-14 DIAGNOSIS — Z8616 Personal history of COVID-19: Secondary | ICD-10-CM

## 2023-10-14 DIAGNOSIS — R7881 Bacteremia: Secondary | ICD-10-CM | POA: Diagnosis not present

## 2023-10-14 DIAGNOSIS — I132 Hypertensive heart and chronic kidney disease with heart failure and with stage 5 chronic kidney disease, or end stage renal disease: Secondary | ICD-10-CM | POA: Diagnosis present

## 2023-10-14 DIAGNOSIS — I480 Paroxysmal atrial fibrillation: Secondary | ICD-10-CM | POA: Diagnosis present

## 2023-10-14 DIAGNOSIS — R079 Chest pain, unspecified: Principal | ICD-10-CM

## 2023-10-14 DIAGNOSIS — I5022 Chronic systolic (congestive) heart failure: Secondary | ICD-10-CM | POA: Diagnosis not present

## 2023-10-14 DIAGNOSIS — D631 Anemia in chronic kidney disease: Secondary | ICD-10-CM | POA: Diagnosis present

## 2023-10-14 DIAGNOSIS — Z95820 Peripheral vascular angioplasty status with implants and grafts: Secondary | ICD-10-CM | POA: Diagnosis not present

## 2023-10-14 DIAGNOSIS — E1122 Type 2 diabetes mellitus with diabetic chronic kidney disease: Secondary | ICD-10-CM | POA: Diagnosis present

## 2023-10-14 DIAGNOSIS — I255 Ischemic cardiomyopathy: Secondary | ICD-10-CM | POA: Diagnosis present

## 2023-10-14 DIAGNOSIS — I809 Phlebitis and thrombophlebitis of unspecified site: Secondary | ICD-10-CM | POA: Diagnosis not present

## 2023-10-14 DIAGNOSIS — N186 End stage renal disease: Secondary | ICD-10-CM | POA: Diagnosis present

## 2023-10-14 DIAGNOSIS — E871 Hypo-osmolality and hyponatremia: Secondary | ICD-10-CM | POA: Diagnosis present

## 2023-10-14 DIAGNOSIS — Z992 Dependence on renal dialysis: Secondary | ICD-10-CM

## 2023-10-14 DIAGNOSIS — N99841 Postprocedural hematoma of a genitourinary system organ or structure following other procedure: Secondary | ICD-10-CM | POA: Diagnosis not present

## 2023-10-14 DIAGNOSIS — Z7982 Long term (current) use of aspirin: Secondary | ICD-10-CM

## 2023-10-14 DIAGNOSIS — B9561 Methicillin susceptible Staphylococcus aureus infection as the cause of diseases classified elsewhere: Secondary | ICD-10-CM

## 2023-10-14 DIAGNOSIS — E781 Pure hyperglyceridemia: Secondary | ICD-10-CM | POA: Diagnosis present

## 2023-10-14 DIAGNOSIS — I2582 Chronic total occlusion of coronary artery: Secondary | ICD-10-CM | POA: Diagnosis present

## 2023-10-14 DIAGNOSIS — A499 Bacterial infection, unspecified: Secondary | ICD-10-CM

## 2023-10-14 DIAGNOSIS — Y838 Other surgical procedures as the cause of abnormal reaction of the patient, or of later complication, without mention of misadventure at the time of the procedure: Secondary | ICD-10-CM | POA: Diagnosis present

## 2023-10-14 DIAGNOSIS — Z961 Presence of intraocular lens: Secondary | ICD-10-CM | POA: Diagnosis present

## 2023-10-14 DIAGNOSIS — T8029XA Infection following other infusion, transfusion and therapeutic injection, initial encounter: Secondary | ICD-10-CM | POA: Diagnosis not present

## 2023-10-14 DIAGNOSIS — Z87891 Personal history of nicotine dependence: Secondary | ICD-10-CM

## 2023-10-14 DIAGNOSIS — A4101 Sepsis due to Methicillin susceptible Staphylococcus aureus: Secondary | ICD-10-CM | POA: Diagnosis not present

## 2023-10-14 DIAGNOSIS — T82856A Stenosis of peripheral vascular stent, initial encounter: Secondary | ICD-10-CM | POA: Diagnosis present

## 2023-10-14 DIAGNOSIS — Z95810 Presence of automatic (implantable) cardiac defibrillator: Secondary | ICD-10-CM | POA: Diagnosis not present

## 2023-10-14 DIAGNOSIS — I272 Pulmonary hypertension, unspecified: Secondary | ICD-10-CM | POA: Diagnosis present

## 2023-10-14 DIAGNOSIS — I5023 Acute on chronic systolic (congestive) heart failure: Secondary | ICD-10-CM | POA: Diagnosis not present

## 2023-10-14 DIAGNOSIS — Z5982 Transportation insecurity: Secondary | ICD-10-CM

## 2023-10-14 DIAGNOSIS — Z8679 Personal history of other diseases of the circulatory system: Secondary | ICD-10-CM

## 2023-10-14 DIAGNOSIS — J449 Chronic obstructive pulmonary disease, unspecified: Secondary | ICD-10-CM | POA: Diagnosis present

## 2023-10-14 DIAGNOSIS — Y840 Cardiac catheterization as the cause of abnormal reaction of the patient, or of later complication, without mention of misadventure at the time of the procedure: Secondary | ICD-10-CM | POA: Diagnosis not present

## 2023-10-14 DIAGNOSIS — M549 Dorsalgia, unspecified: Secondary | ICD-10-CM | POA: Diagnosis not present

## 2023-10-14 DIAGNOSIS — I739 Peripheral vascular disease, unspecified: Secondary | ICD-10-CM | POA: Diagnosis not present

## 2023-10-14 DIAGNOSIS — K5900 Constipation, unspecified: Secondary | ICD-10-CM | POA: Diagnosis present

## 2023-10-14 DIAGNOSIS — I5020 Unspecified systolic (congestive) heart failure: Secondary | ICD-10-CM | POA: Diagnosis present

## 2023-10-14 DIAGNOSIS — E1142 Type 2 diabetes mellitus with diabetic polyneuropathy: Secondary | ICD-10-CM | POA: Diagnosis present

## 2023-10-14 DIAGNOSIS — I25110 Atherosclerotic heart disease of native coronary artery with unstable angina pectoris: Secondary | ICD-10-CM

## 2023-10-14 DIAGNOSIS — I2489 Other forms of acute ischemic heart disease: Secondary | ICD-10-CM | POA: Diagnosis not present

## 2023-10-14 DIAGNOSIS — N2581 Secondary hyperparathyroidism of renal origin: Secondary | ICD-10-CM | POA: Diagnosis present

## 2023-10-14 DIAGNOSIS — E1151 Type 2 diabetes mellitus with diabetic peripheral angiopathy without gangrene: Secondary | ICD-10-CM | POA: Diagnosis present

## 2023-10-14 DIAGNOSIS — E872 Acidosis, unspecified: Secondary | ICD-10-CM | POA: Diagnosis present

## 2023-10-14 DIAGNOSIS — K219 Gastro-esophageal reflux disease without esophagitis: Secondary | ICD-10-CM | POA: Diagnosis present

## 2023-10-14 DIAGNOSIS — R5381 Other malaise: Secondary | ICD-10-CM | POA: Diagnosis not present

## 2023-10-14 DIAGNOSIS — Z955 Presence of coronary angioplasty implant and graft: Secondary | ICD-10-CM | POA: Diagnosis not present

## 2023-10-14 DIAGNOSIS — Z8673 Personal history of transient ischemic attack (TIA), and cerebral infarction without residual deficits: Secondary | ICD-10-CM

## 2023-10-14 DIAGNOSIS — I7143 Infrarenal abdominal aortic aneurysm, without rupture: Secondary | ICD-10-CM | POA: Diagnosis present

## 2023-10-14 DIAGNOSIS — I2510 Atherosclerotic heart disease of native coronary artery without angina pectoris: Secondary | ICD-10-CM | POA: Diagnosis not present

## 2023-10-14 DIAGNOSIS — I8002 Phlebitis and thrombophlebitis of superficial vessels of left lower extremity: Secondary | ICD-10-CM | POA: Diagnosis not present

## 2023-10-14 DIAGNOSIS — Z79899 Other long term (current) drug therapy: Secondary | ICD-10-CM

## 2023-10-14 DIAGNOSIS — E11319 Type 2 diabetes mellitus with unspecified diabetic retinopathy without macular edema: Secondary | ICD-10-CM | POA: Diagnosis present

## 2023-10-14 DIAGNOSIS — Y633 Inadvertent exposure of patient to radiation during medical care: Secondary | ICD-10-CM | POA: Insufficient documentation

## 2023-10-14 DIAGNOSIS — I4901 Ventricular fibrillation: Secondary | ICD-10-CM | POA: Diagnosis not present

## 2023-10-14 DIAGNOSIS — Y848 Other medical procedures as the cause of abnormal reaction of the patient, or of later complication, without mention of misadventure at the time of the procedure: Secondary | ICD-10-CM | POA: Diagnosis not present

## 2023-10-14 DIAGNOSIS — I70212 Atherosclerosis of native arteries of extremities with intermittent claudication, left leg: Secondary | ICD-10-CM | POA: Diagnosis not present

## 2023-10-14 DIAGNOSIS — I951 Orthostatic hypotension: Secondary | ICD-10-CM | POA: Diagnosis present

## 2023-10-14 DIAGNOSIS — R296 Repeated falls: Secondary | ICD-10-CM | POA: Diagnosis present

## 2023-10-14 DIAGNOSIS — I5189 Other ill-defined heart diseases: Secondary | ICD-10-CM | POA: Diagnosis not present

## 2023-10-14 DIAGNOSIS — I252 Old myocardial infarction: Secondary | ICD-10-CM

## 2023-10-14 DIAGNOSIS — I472 Ventricular tachycardia, unspecified: Secondary | ICD-10-CM | POA: Diagnosis not present

## 2023-10-14 DIAGNOSIS — E7800 Pure hypercholesterolemia, unspecified: Secondary | ICD-10-CM | POA: Diagnosis present

## 2023-10-14 DIAGNOSIS — Z7902 Long term (current) use of antithrombotics/antiplatelets: Secondary | ICD-10-CM

## 2023-10-14 DIAGNOSIS — Z8249 Family history of ischemic heart disease and other diseases of the circulatory system: Secondary | ICD-10-CM

## 2023-10-14 DIAGNOSIS — D649 Anemia, unspecified: Secondary | ICD-10-CM | POA: Diagnosis not present

## 2023-10-14 HISTORY — DX: Atherosclerotic heart disease of native coronary artery with unstable angina pectoris: I25.110

## 2023-10-14 LAB — CBC WITH DIFFERENTIAL/PLATELET
Abs Immature Granulocytes: 0.03 10*3/uL (ref 0.00–0.07)
Basophils Absolute: 0.1 10*3/uL (ref 0.0–0.1)
Basophils Relative: 1 %
Eosinophils Absolute: 0.2 10*3/uL (ref 0.0–0.5)
Eosinophils Relative: 2 %
HCT: 32.4 % — ABNORMAL LOW (ref 39.0–52.0)
Hemoglobin: 10.7 g/dL — ABNORMAL LOW (ref 13.0–17.0)
Immature Granulocytes: 0 %
Lymphocytes Relative: 10 %
Lymphs Abs: 0.8 10*3/uL (ref 0.7–4.0)
MCH: 33 pg (ref 26.0–34.0)
MCHC: 33 g/dL (ref 30.0–36.0)
MCV: 100 fL (ref 80.0–100.0)
Monocytes Absolute: 0.8 10*3/uL (ref 0.1–1.0)
Monocytes Relative: 10 %
Neutro Abs: 6.1 10*3/uL (ref 1.7–7.7)
Neutrophils Relative %: 77 %
Platelets: 170 10*3/uL (ref 150–400)
RBC: 3.24 MIL/uL — ABNORMAL LOW (ref 4.22–5.81)
RDW: 13.7 % (ref 11.5–15.5)
WBC: 8 10*3/uL (ref 4.0–10.5)
nRBC: 0 % (ref 0.0–0.2)

## 2023-10-14 LAB — BASIC METABOLIC PANEL WITH GFR
Anion gap: 9 (ref 5–15)
BUN: 28 mg/dL — ABNORMAL HIGH (ref 8–23)
CO2: 26 mmol/L (ref 22–32)
Calcium: 8.9 mg/dL (ref 8.9–10.3)
Chloride: 96 mmol/L — ABNORMAL LOW (ref 98–111)
Creatinine, Ser: 4.05 mg/dL — ABNORMAL HIGH (ref 0.61–1.24)
GFR, Estimated: 15 mL/min — ABNORMAL LOW (ref >60)
Glucose, Bld: 107 mg/dL — ABNORMAL HIGH (ref 70–99)
Potassium: 4.5 mmol/L (ref 3.5–5.1)
Sodium: 131 mmol/L — ABNORMAL LOW (ref 135–145)

## 2023-10-14 LAB — TROPONIN I (HIGH SENSITIVITY)
Troponin I (High Sensitivity): 32 ng/L — ABNORMAL HIGH (ref <18)
Troponin I (High Sensitivity): 35 ng/L — ABNORMAL HIGH (ref <18)

## 2023-10-14 LAB — HEPATITIS B SURFACE ANTIGEN: Hepatitis B Surface Ag: NONREACTIVE

## 2023-10-14 LAB — HIV ANTIBODY (ROUTINE TESTING W REFLEX): HIV Screen 4th Generation wRfx: NONREACTIVE

## 2023-10-14 LAB — HEPARIN LEVEL (UNFRACTIONATED): Heparin Unfractionated: <0.1 [IU]/mL — ABNORMAL LOW (ref 0.30–0.70)

## 2023-10-14 MED ORDER — ALLOPURINOL 100 MG PO TABS
100.0000 mg | ORAL_TABLET | Freq: Every day | ORAL | Status: DC
  Administered 2023-10-14 – 2023-10-23 (×9): 100 mg via ORAL
  Filled 2023-10-14 (×9): qty 1

## 2023-10-14 MED ORDER — ASPIRIN 300 MG RE SUPP: 300.0000 mg | RECTAL | Status: DC

## 2023-10-14 MED ORDER — ONDANSETRON HCL 4 MG/2ML IJ SOLN
4.0000 mg | Freq: Four times a day (QID) | INTRAMUSCULAR | Status: DC | PRN
Start: 2023-10-14 — End: 2023-10-22
  Administered 2023-10-14 – 2023-10-21 (×9): 4 mg via INTRAVENOUS
  Filled 2023-10-14 (×10): qty 2

## 2023-10-14 MED ORDER — HEPARIN (PORCINE) 25000 UT/250ML-% IV SOLN
1200.0000 [IU]/h | INTRAVENOUS | Status: DC
  Administered 2023-10-14: 800 [IU]/h via INTRAVENOUS
  Administered 2023-10-15: 1200 [IU]/h via INTRAVENOUS
  Filled 2023-10-14 (×2): qty 250

## 2023-10-14 MED ORDER — ASPIRIN 81 MG PO TBEC: 81.0000 mg | DELAYED_RELEASE_TABLET | ORAL | Status: DC

## 2023-10-14 MED ORDER — SODIUM CHLORIDE 0.9 % IV SOLN: INTRAVENOUS | Status: DC

## 2023-10-14 MED ORDER — ATORVASTATIN CALCIUM 40 MG PO TABS
40.0000 mg | ORAL_TABLET | Freq: Every day | ORAL | Status: DC
  Administered 2023-10-14 – 2023-10-18 (×5): 40 mg via ORAL
  Filled 2023-10-14 (×5): qty 1

## 2023-10-14 MED ORDER — MIDODRINE HCL 5 MG PO TABS
2.5000 mg | ORAL_TABLET | ORAL | Status: DC
  Administered 2023-10-14: 2.5 mg via ORAL
  Filled 2023-10-14: qty 1

## 2023-10-14 MED ORDER — MIDODRINE HCL 5 MG PO TABS: 5.0000 mg | ORAL_TABLET | ORAL | Status: DC

## 2023-10-14 MED ORDER — ACETAMINOPHEN 325 MG PO TABS: 650.0000 mg | ORAL_TABLET | ORAL | Status: DC | PRN

## 2023-10-14 MED ORDER — ALBUTEROL SULFATE (2.5 MG/3ML) 0.083% IN NEBU: 3.0000 mL | INHALATION_SOLUTION | Freq: Four times a day (QID) | RESPIRATORY_TRACT | Status: DC | PRN

## 2023-10-14 MED ORDER — OXYCODONE-ACETAMINOPHEN 5-325 MG PO TABS
1.0000 | ORAL_TABLET | ORAL | Status: DC | PRN
  Administered 2023-10-16 – 2023-10-23 (×6): 1 via ORAL
  Filled 2023-10-14 (×6): qty 1

## 2023-10-14 MED ORDER — TRAZODONE HCL 50 MG PO TABS
50.0000 mg | ORAL_TABLET | Freq: Every day | ORAL | Status: DC
  Administered 2023-10-14 – 2023-10-23 (×9): 50 mg via ORAL
  Filled 2023-10-14 (×9): qty 1

## 2023-10-14 MED ORDER — VITAMIN B-6 25 MG PO TABS
100.0000 mg | ORAL_TABLET | Freq: Every day | ORAL | Status: DC
  Administered 2023-10-15 – 2023-10-23 (×8): 100 mg via ORAL
  Filled 2023-10-14 (×2): qty 4
  Filled 2023-10-14: qty 1
  Filled 2023-10-14: qty 4
  Filled 2023-10-14: qty 1
  Filled 2023-10-14: qty 4
  Filled 2023-10-14 (×4): qty 1

## 2023-10-14 MED ORDER — HEPARIN BOLUS VIA INFUSION
2000.0000 [IU] | Freq: Once | INTRAVENOUS | Status: AC
  Administered 2023-10-14: 2000 [IU] via INTRAVENOUS
  Filled 2023-10-14: qty 2000

## 2023-10-14 MED ORDER — NITROGLYCERIN IN D5W 200-5 MCG/ML-% IV SOLN
0.0000 ug/min | INTRAVENOUS | Status: DC
  Administered 2023-10-14: 5 ug/min via INTRAVENOUS
  Filled 2023-10-14: qty 250

## 2023-10-14 MED ORDER — NITROGLYCERIN 0.4 MG SL SUBL
0.4000 mg | SUBLINGUAL_TABLET | SUBLINGUAL | Status: DC | PRN
Start: 2023-10-14 — End: 2023-10-24
  Filled 2023-10-14: qty 1

## 2023-10-14 MED ORDER — ASPIRIN 81 MG PO TBEC
81.0000 mg | DELAYED_RELEASE_TABLET | Freq: Every day | ORAL | Status: DC
  Administered 2023-10-15 – 2023-10-24 (×10): 81 mg via ORAL
  Filled 2023-10-14 (×11): qty 1

## 2023-10-14 MED ORDER — LATANOPROST 0.005 % OP SOLN
1.0000 [drp] | Freq: Every day | OPHTHALMIC | Status: DC
  Administered 2023-10-14 – 2023-10-23 (×9): 1 [drp] via OPHTHALMIC
  Filled 2023-10-14 (×2): qty 2.5

## 2023-10-14 MED ORDER — ASPIRIN 81 MG PO CHEW: 324.0000 mg | CHEWABLE_TABLET | ORAL | Status: DC

## 2023-10-14 MED ORDER — CARVEDILOL 3.125 MG PO TABS
3.1250 mg | ORAL_TABLET | Freq: Two times a day (BID) | ORAL | Status: DC
  Administered 2023-10-14 – 2023-10-18 (×6): 3.125 mg via ORAL
  Filled 2023-10-14 (×6): qty 1

## 2023-10-14 MED ORDER — TORSEMIDE 20 MG PO TABS
100.0000 mg | ORAL_TABLET | ORAL | Status: DC
  Administered 2023-10-14 – 2023-10-23 (×6): 100 mg via ORAL
  Filled 2023-10-14 (×6): qty 5

## 2023-10-14 MED ORDER — AMIODARONE HCL 200 MG PO TABS
200.0000 mg | ORAL_TABLET | Freq: Every day | ORAL | Status: DC
  Administered 2023-10-14 – 2023-10-24 (×11): 200 mg via ORAL
  Filled 2023-10-14 (×11): qty 1

## 2023-10-14 MED ORDER — MEXILETINE HCL 250 MG PO CAPS
250.0000 mg | ORAL_CAPSULE | Freq: Two times a day (BID) | ORAL | Status: DC
  Administered 2023-10-14 – 2023-10-24 (×20): 250 mg via ORAL
  Filled 2023-10-14 (×22): qty 1

## 2023-10-14 MED ORDER — NITROGLYCERIN 0.4 MG SL SUBL: 0.4000 mg | SUBLINGUAL_TABLET | SUBLINGUAL | Status: DC | PRN

## 2023-10-14 NOTE — Progress Notes (Signed)
   Plan is for [facility] tomorrow for further evaluation of chest pain and recent ICD shocks for VT/ VF. The patient understands that risks include but are not limited to stroke (1 in 1000), death (1 in 1000), kidney failure [usually temporary] (1 in 500), bleeding (1 in 200), allergic reaction [possibly serious] (1 in 200), and agrees to proceed. Will make NPO at midnight and place pre-cath orders.  Of note, he is an ESRD and is on dialysis M/ W/ F.. Last dialysis session was on Friday 10/12/2023. Nephrology has been consulted.  Othmar Ringer E Bijal Siglin, PA-C 10/14/2023 11:10 AM

## 2023-10-14 NOTE — Plan of Care (Signed)

## 2023-10-14 NOTE — ED Provider Notes (Signed)
 [facility] EMERGENCY DEPARTMENT AT [facility] Provider Note   CSN: [id number] Arrival date & time: 10/14/23  9445     Patient presents with: Chest Pain   James Chan is a 73 y.o. male.  Patient with past medical history significant for ESRD on hemodialysis, type II DM, CHF, NSTEMI, V. tach, ICD presents to the emergency room via EMS complaining of chest pain.  He states he has had chest pain ongoing for approximately 2 days.  EMS initially called a code STEMI which was apparently canceled by a physician prior to arrival.  EMS reports administering 324 mg of aspirin  and 1 dose of sublingual nitroglycerin .  Patient states pain was initially 7 out of 10.  He says the pain is in the left side of the chest but is unable to describe the pain.  He states that now the pain is 5 out of 10 and is a burning sensation.  He does report associated shortness of breath along with nausea.  He denies emesis, or radiation of symptoms, abdominal pain.    Chest Pain      Prior to Admission medications   Medication Sig Start Date End Date Taking? Authorizing Provider  allopurinol  (ZYLOPRIM ) 100 MG tablet Take 100 mg by mouth daily at 6 PM. (1900)   Yes [provider]  amiodarone  (PACERONE ) 200 MG tablet Take 1 tablet (200 mg total) by mouth daily. 01/22/23  Yes Monetta Redell PARAS, MD  ascorbic acid  (VITAMIN C) 500 MG tablet Take 500 mg by mouth daily in the afternoon. 09/03/20  Yes [provider]  aspirin  EC 81 MG tablet Take 81 mg by mouth 2 (two) times a week. Swallow whole.   Yes [provider]  cetirizine (ZYRTEC) 10 MG tablet Take 10 mg by mouth daily as needed for allergies. 04/24/23  Yes [provider]  clopidogrel  (PLAVIX ) 75 MG tablet Take 1 tablet (75 mg total) by mouth daily. 06/15/20  Yes Serene Gaile ORN, MD  latanoprost  (XALATAN ) 0.005 % ophthalmic solution Place 1 drop into both eyes at bedtime. 06/16/14  Yes [provider]   lidocaine -prilocaine  (EMLA ) cream Apply 1 application topically See admin instructions. Apply small amount to access site 1 to 2 hours before dialysis then cover with saran wrap.  Three days a week 02/17/19  Yes [provider]  midodrine  (PROAMATINE ) 10 MG tablet Take 5-10 mg by mouth See admin instructions. Take 1 tablet by mouth with hemodialysis and 1/2 tablet all other days 05/29/19  Yes [provider]  nitroGLYCERIN  (NITROSTAT ) 0.4 MG SL tablet Place 1 tablet (0.4 mg total) under the tongue every 5 (five) minutes as needed for chest pain. 02/16/22  Yes Monetta Redell PARAS, MD  oxyCODONE -acetaminophen  (PERCOCET/ROXICET) 5-325 MG tablet Take 1 tablet by mouth every 4 (four) hours as needed for severe pain.  10/16/19  Yes [provider]  PROAIR  HFA 108 (90 Base) MCG/ACT inhaler Inhale 2 puffs into the lungs every 6 (six) hours as needed for wheezing or shortness of breath.  10/01/17  Yes [provider]  pyridoxine (B-6) 100 MG tablet Take 100 mg by mouth daily in the afternoon. 09/03/20  Yes [provider]  torsemide  (DEMADEX ) 100 MG tablet Take 100 mg by mouth 4 (four) times a week. Take 1 tablet (100 mg) by mouth in the morning on Tuesdays, Thursdays, Saturdays & Sundays 10/20/20  Yes [provider]  traZODone (DESYREL) 50 MG tablet Take 50 mg by mouth at bedtime.  06/29/21  Yes [provider]  vitamin E 200 UNIT capsule Take 200 Units by mouth daily in the afternoon.   Yes [provider]  atorvastatin  (LIPITOR) 40 MG tablet Take 40 mg by mouth daily. Patient not taking: Reported on 10/14/2023    [provider]  mexiletine (MEXITIL) 250 MG capsule Take 1 capsule (250 mg total) by mouth 2 (two) times daily. Patient not taking: Reported on 10/14/2023 10/08/23   Inocencio Soyla Lunger, MD    Allergies: Patient has no known allergies.    Review of Systems  Cardiovascular:  Positive for chest pain.    Updated Vital  Signs BP (!) 150/75   Pulse 76   Temp 97.8 F (36.6 C) (Oral)   Resp 18   Ht 5' 6 (1.676 m)   Wt 69.2 kg   SpO2 100%   BMI 24.63 kg/m   Physical Exam Vitals and nursing note reviewed.  Constitutional:      General: He is not in acute distress.    Appearance: He is well-developed. He is not diaphoretic.  HENT:     Head: Normocephalic and atraumatic.   Eyes:     Conjunctiva/sclera: Conjunctivae normal.    Cardiovascular:     Rate and Rhythm: Normal rate and regular rhythm.  Pulmonary:     Effort: Pulmonary effort is normal. No respiratory distress.     Breath sounds: Normal breath sounds.  Abdominal:     Palpations: Abdomen is soft.     Tenderness: There is no abdominal tenderness.   Musculoskeletal:        General: No swelling.     Cervical back: Neck supple.     Right lower leg: No edema.     Left lower leg: No edema.   Skin:    General: Skin is warm and dry.     Capillary Refill: Capillary refill takes less than 2 seconds.   Neurological:     Mental Status: He is alert.   Psychiatric:        Mood and Affect: Mood normal.     (all labs ordered are listed, but only abnormal results are displayed) Labs Reviewed  BASIC METABOLIC PANEL WITH GFR  CBC WITH DIFFERENTIAL/PLATELET  HEPARIN  LEVEL (UNFRACTIONATED)  TROPONIN I (HIGH SENSITIVITY)    EKG: None  Radiology: No results found.   .Critical Care  Performed by: Logan Ubaldo NOVAK, PA-C Authorized by: Logan Ubaldo NOVAK, PA-C   Critical care provider statement:    Critical care time (minutes):  30   Critical care was necessary to treat or prevent imminent or life-threatening deterioration of the following conditions:  Cardiac failure   Critical care was time spent personally by me on the following activities:  Development of treatment plan with patient or surrogate, discussions with consultants, evaluation of patient's response to treatment, examination of patient, ordering and review of laboratory  studies, ordering and review of radiographic studies, ordering and performing treatments and interventions, pulse oximetry, re-evaluation of patient's condition and review of old charts    Medications Ordered in the ED  heparin  bolus via infusion 2,000 Units (has no administration in time range)  heparin  ADULT infusion 100 units/mL (25000 units/250mL) (has no administration in time range)    Clinical Course as of 10/14/23 0635  Sun Oct 14, 2023  0606 Chest pain. HX of similar. Serial trops. Interrogate ICD.  [CC]    Clinical Course User Index [CC] Jerral Meth, MD  Medical Decision Making Amount and/or Complexity of Data Reviewed Labs: ordered. Radiology: ordered.   This patient presents to the ED for concern of chest pain, this involves an extensive number of treatment options, and is a complaint that carries with it a high risk of complications and morbidity.  The differential diagnosis includes ACS, pneumonia, dysrhythmia requiring defibrillation, PE, musculoskeletal pain, others   Co morbidities / Chronic conditions that complicate the patient evaluation  ESRD on hemodialysis, ICD, type II DM, CHF   Additional history obtained:  Additional history obtained from EMR External records from outside source obtained and reviewed including cardiology/EP notes from June 16.  Patient evaluated after having 6 ICD shocks the previous Friday during a hemodialysis session.  Patient taking 200 mg of amiodarone  daily, added mexiletine 250 mg twice daily   Lab Tests:  I Ordered labs including metabolic panel, CBC, troponins   Imaging Studies ordered:  I ordered imaging studies including chest x-ray Images are pending at shift handoff   Cardiac Monitoring: / EKG:  The patient was maintained on a cardiac monitor.  I personally viewed and interpreted the cardiac monitored which showed an underlying rhythm of: Sinus rhythm   Problem List / ED  Course / Critical interventions / Medication management  Medication was ordered including heparin  by cardiology   Consultations Obtained:  Dr. Ladona with cardiology evaluated the patient and decided to treat the patient as an NSTEMI   Social Determinants of Health:  Patient is a former smoker   Test / Admission - Considered:  Heparin  has been initiated to treat patient as an NSTEMI.,  Lab work and imaging are pending at this time.  Patient care being signed out to Lonni Camp, PA-C at shift handoff.  Disposition will likely be admission pending results of workup      Final diagnoses:  Chest pain, unspecified type    ED Discharge Orders     None          Logan Ubaldo KATHEE DEVONNA 10/14/23 [id number]    Jerral Meth, MD 10/14/23 [id number]
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
## 2023-10-14 NOTE — ED Notes (Signed)
 CCMD notified. Pt is on monitor.

## 2023-10-14 NOTE — ED Provider Notes (Signed)
 Chest pain x 2 days.  Dr. Ladona treats as NSTEMI.  Heparin  started.    James Chan likely admit  Labs obtained and reviewed interpreted by me and is notable for worsening renal function with creatinine of 4.05, it was 2.79 approximately 6 days ago.  His troponin is mildly elevated at 32  Patient will be admitted by Dr. Ladona for NSTEMI  BP (!) 153/68   Pulse 79   Temp 97.8 F (36.6 C) (Oral)   Resp (!) 22   Ht 5' 6 (1.676 m)   Wt 69.2 kg   SpO2 93%   BMI 24.63 kg/m   Results for orders placed or performed during the hospital encounter of 10/14/23  Basic metabolic panel   Collection Time: 10/14/23  5:57 AM  Result Value Ref Range   Sodium 131 (L) 135 - 145 mmol/L   Potassium 4.5 3.5 - 5.1 mmol/L   Chloride 96 (L) 98 - 111 mmol/L   CO2 26 22 - 32 mmol/L   Glucose, Bld 107 (H) 70 - 99 mg/dL   BUN 28 (H) 8 - 23 mg/dL   Creatinine, Ser 5.94 (H) 0.61 - 1.24 mg/dL   Calcium  8.9 8.9 - 10.3 mg/dL   GFR, Estimated 15 (L) >60 mL/min   Anion gap 9 5 - 15  CBC with Differential   Collection Time: 10/14/23  5:57 AM  Result Value Ref Range   WBC 8.0 4.0 - 10.5 K/uL   RBC 3.24 (L) 4.22 - 5.81 MIL/uL   Hemoglobin 10.7 (L) 13.0 - 17.0 g/dL   HCT 67.5 (L) 60.9 - 47.9 %   MCV 100.0 80.0 - 100.0 fL   MCH 33.0 26.0 - 34.0 pg   MCHC 33.0 30.0 - 36.0 g/dL   RDW 86.2 88.4 - 84.4 %   Platelets 170 150 - 400 K/uL   nRBC 0.0 0.0 - 0.2 %   Neutrophils Relative % 77 %   Neutro Abs 6.1 1.7 - 7.7 K/uL   Lymphocytes Relative 10 %   Lymphs Abs 0.8 0.7 - 4.0 K/uL   Monocytes Relative 10 %   Monocytes Absolute 0.8 0.1 - 1.0 K/uL   Eosinophils Relative 2 %   Eosinophils Absolute 0.2 0.0 - 0.5 K/uL   Basophils Relative 1 %   Basophils Absolute 0.1 0.0 - 0.1 K/uL   Immature Granulocytes 0 %   Abs Immature Granulocytes 0.03 0.00 - 0.07 K/uL  Troponin I (High Sensitivity)   Collection Time: 10/14/23  5:57 AM  Result Value Ref Range   Troponin I (High Sensitivity) 32 (H) <18 ng/L   DG Chest Port 1  View Result Date: 10/14/2023 CLINICAL DATA:  Chest pain EXAM: PORTABLE CHEST 1 VIEW COMPARISON:  02/02/2020 FINDINGS: External AICD is unchanged with tip of lead in the projection of the inferior aspect of the aortic knob. Heart size is enlarged. There is a small to moderate right pleural effusion. Pulmonary vascular congestion. Decreased aeration to the right lower lobe scratch set decreased aeration to the right lower lung may reflect a combination of atelectasis and or airspace disease. IMPRESSION: 1. Cardiomegaly and pulmonary vascular congestion. 2. Small to moderate right pleural effusion. 3. Decreased aeration to the right lower lung may reflect a combination of atelectasis and or airspace disease. Electronically Signed   By: Waddell Calk M.D.   On: 10/14/2023 06:36   CUP PACEART INCLINIC DEVICE CHECK Result Date: 10/08/2023 Subcutaneous ICD check in clinic. Patient had 6 total events - WCT and Torsades  on friday 10/05/23. Each of the 6 events were appropriately treated with 1 shock each.  . Electrode impedance status okay. No programming changes. Remaining longevity to ERI 50 %.Powell Level, BSN, RN  US  Carotid Bilateral Result Date: 09/25/2023 CLINICAL DATA:  The patient complains of dizziness and blurred vision. History of hypertension, hyperlipidemia, and visual disturbance. Bilateral ICA stents. EXAM: BILATERAL CAROTID DUPLEX ULTRASOUND TECHNIQUE: Elnor scale imaging, color Doppler and duplex ultrasound were performed of bilateral carotid and vertebral arteries in the neck. COMPARISON:  None Available. FINDINGS: Criteria: Quantification of carotid stenosis is based on velocity parameters that correlate the residual internal carotid diameter with NASCET-based stenosis levels, using the diameter of the distal internal carotid lumen as the denominator for stenosis measurement. The following velocity measurements were obtained: RIGHT ICA: 119/16 cm/sec CCA: 114/18 cm/sec SYSTOLIC ICA/CCA RATIO:  1.04  ECA: 326 cm/sec LEFT ICA: 190/50 cm/sec CCA: 78/25 cm/sec SYSTOLIC ICA/CCA RATIO:  2.43 ECA: 178 cm/sec RIGHT CAROTID ARTERY: The right common carotid artery demonstrates near circumferential mixed irregular plaque. Carotid stent is present and patent, however significant calcified plaque creates shadowing in B-mode and may underestimate calculated velocities. RIGHT VERTEBRAL ARTERY:  Antegrade flow LEFT CAROTID ARTERY: The left common carotid artery demonstrates near circumferential irregular mixed plaque. Significant mixed plaque in the carotid bulb creating shadowing in B-mode imaging and may underestimate velocities the carotid stent on the left is patent. LEFT VERTEBRAL ARTERY: IMPRESSION: Bilateral patent ICA stent. Velocities suggest less than 50% stenosis in the right ICA and 50-69% stenosis in the left ICA. ICA velocities may be underestimated secondary to significant calcified plaque burden in this region. Electronically Signed   By: Cordella Banner   On: 09/25/2023 15:15      Nivia Colon, PA-C 10/14/23 [id number]    Cottie Donnice PARAS, MD 10/14/23 [phone]
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
## 2023-10-14 NOTE — Progress Notes (Addendum)
 Progress Note  Patient Name: James Chan Date of Encounter: 10/14/2023 [facility] Cardiologist: Redell Leiter, MD   Interval Summary   Caucasian male patient with supine hypertension, orthostatic hypotension, diabetes mellitus with stage V chronic kidney disease presently on hemodialysis via right brachial AV fistula, hypercholesterolemia, PAD with right SFA stenting, LAD stenting in 2023, CAD presenting during COVID with recurrent VT VF, cardiac catheterization on 01/15/2020 revealing occluded RCA and distal circumflex stents that were placed remotely, moderate disease in left main and proximal LAD with LAD to RCA collaterals SP subcutaneous [employer] ICD placement recently seen by Dr. Inocencio on 10/08/2023 with recurrent ICD discharge due to recurrent VT VF requiring 6 shocks on 10/05/2023 patient unaware.  He is now brought via EMS with recurrent chest pain that started 2 days ago on and off requiring nitroglycerin  use with partial relief of chest pain, this morning chest pain persisted since 1130 at 12 midnight and had to use 3 sublingual nitroglycerin  and called the EMS.  He also states that last evening while he was sitting on the chair he had an episode of syncope but does not recollect any ICD discharge.  Presently chest pain-free.  Recently has been noticing worsening dizziness and mild associated shortness of breath with chest pain.   Vital Signs Vitals:   10/14/23 0558 10/14/23 0600  BP:  (!) 150/75  Pulse:  76  Resp:  18  Temp:  97.8 F (36.6 C)  TempSrc:  Oral  SpO2:  100%  Weight: 69.2 kg   Height: 5' 6 (1.676 m)    No intake or output data in the 24 hours ending 10/14/23 0640    10/14/2023    5:58 AM 10/08/2023    4:07 PM 09/18/2023    1:37 PM  Last 3 Weights  Weight (lbs) 152 lb 9.6 oz 152 lb 9.6 oz 151 lb  Weight (kg) 69.219 kg 69.219 kg 68.493 kg      ECG  Normal sinus rhythm, LVH with repolarization abnormality.  No significant change from  prior EKG.- Personally Reviewed   Coronary angiogram 01/15/20     1. Multivessel coronary artery disease, overall similar in appearance to prior catheterization from January.  Distal LMCA disease remains eccentric and noncritical with approximately 50% stenosis.  There is 50 to 60% stenosis of the proximal LAD involving D1 as well as chronic total occlusions of the distal LCx and ostial RCA. 2. Moderately elevated left ventricular filling pressure (LVEDP 25-30 mmHg).   Recommendations: 1. No acute coronary lesion to explain cardiac arrest with VT/VF.  Suspect arrhythmia may have been scar mediated in the setting of reduced LVEF and chronic ischemic heart disease.  Ongoing management per EP. 2. Continue aggressive secondary prevention. 3. Restart heparin  infusion 8 hours after removal of left femoral artery sheath.  Given paroxysmal atrial fibrillation, consider transitioning from dual antiplatelet therapy to anticoagulation plus antiplatelet agent at discharge.  ECHOCARDIOGRAM COMPLETE 07/13/2023  1. Left ventricular ejection fraction, by estimation, is 30 to 35%. The left ventricle has moderately decreased function. The left ventricle demonstrates global hypokinesis. The left ventricular internal cavity size was mildly dilated. Left ventricular diastolic parameters are consistent with Grade II diastolic dysfunction (pseudonormalization). Elevated left ventricular end-diastolic pressure. The average left ventricular global longitudinal strain is -6.0 %. The global longitudinal strain is abnormal. 2. Right ventricular systolic function is low normal. The right ventricular size is mildly enlarged. 3. Tricuspid valve regurgitation is moderate and eccentric. 4. There is severely elevated  pulmonary artery systolic pressure. The estimated right ventricular systolic pressure is 77.6 mmHg. 5. Left atrial size was moderately dilated. 6. Right atrial size was moderately dilated. 7. The mitral valve is  degenerative. Mild mitral valve regurgitation. No evidence of mitral stenosis. 8. Aortic valve regurgitation is trivial. No aortic stenosis is present. 9. The inferior vena cava is normal in size with <50% respiratory variability, suggesting right atrial pressure of 8 mmHg. 10 compared to 03/07/2022, EF is improved from 20 to 25%, moderately reduced RV function is now normal.   Review of Systems  Cardiovascular:  Positive for chest pain, dyspnea on exertion and syncope. Negative for claudication, leg swelling, orthopnea and palpitations.    Blood pressure (!) 150/75, pulse 76, temperature 97.8 F (36.6 C), temperature source Oral, resp. rate 18, height 5' 6 (1.676 m), weight 69.2 kg, SpO2 100%. Physical Exam Neck:     Vascular: No carotid bruit or JVD.   Cardiovascular:     Rate and Rhythm: Normal rate and regular rhythm.     Pulses: Intact distal pulses.          Dorsalis pedis pulses are 0 on the right side and 0 on the left side.       Posterior tibial pulses are 0 on the right side and 0 on the left side.     Heart sounds: Normal heart sounds. No murmur heard.    No gallop.     Comments: Right arm AV fistula noted Pulmonary:     Effort: Pulmonary effort is normal.     Breath sounds: Normal breath sounds.  Abdominal:     General: Bowel sounds are normal.     Palpations: Abdomen is soft.   Musculoskeletal:     Right lower leg: No edema.     Left lower leg: No edema.     Assessment & Plan  1.  Coronary artery disease of the native vessel with unstable angina pectoris 2.  Recurrent VT VF arrest 3.  Chronic systolic heart failure 4.  Supine hypertension and orthostatic hypotension 5.  Diabetes mellitus diet controlled, off of all medications since dialysis. 6.  Hypercholesterolemia 7. Patient is DNR and patient does not want any life prolonging measures like intubation and CPR.  Recommendations: Patient is presently chest pain-free, starting back on IV heparin , will  discontinue Plavix  and switch him to Effient and continue aspirin , plan on repeat cardiac catheterization tomorrow after reviewing his medical history and see if LAD needs to be revascularized to improve his overall outcomes.  Patient has occluded CX and RCA to be left alone as noted in 2021 cardiac catheterization when he had COVID, appears to be chronic and does not appear to be abnormal for PCI.  Repeat echocardiogram, start him on low-dose of beta-blocker therapy and restart statins.  Patient presently not on any guideline directed medical therapy in view of severe orthostasis and difficulty during dialysis due to hypotension episodes.  Fortunately recently has not had any issues with dialysis.  Needs admission to telemetry and close monitoring.  With regard to recurrent VT VF, was recently evaluated by Dr. Inocencio on 10/08/2023 and recommended addition of mexiletine, patient has not been able to get this in the pharmacy and is awaiting arrival however we will go ahead and initiate this.  Continue amiodarone  as well.  For questions or updates, please contact [facility] Please consult [email or web address] for contact info under       Signed,    Gordy Bergamo, MD,  [facility] 10/14/2023, 6:40 AM [facility] [address] [place], [place] [address] Phone: [phone]. Fax:  [phone]
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
## 2023-10-14 NOTE — ED Triage Notes (Signed)
 Pt BIB [facility] d/t CP that began @ 2300 while mowing grass - hx of 4 Mis and 2 CVAs.  CODE STEMI was called by [facility].  BP 140/78 HR 70 RR18 O2 99  324 ASA PO given 1 nitroglycerin  SL   7/10 to 5/10 pain p nitroglycerin 

## 2023-10-15 ENCOUNTER — Encounter (HOSPITAL_COMMUNITY)
Admission: EM | Disposition: A | Discharge status: Home / Self Care | Payer: Self-pay | Source: Home / Self Care | Attending: Cardiology

## 2023-10-15 ENCOUNTER — Other Ambulatory Visit: Payer: Self-pay

## 2023-10-15 ENCOUNTER — Encounter (HOSPITAL_COMMUNITY): Payer: Self-pay | Admitting: Internal Medicine

## 2023-10-15 DIAGNOSIS — I739 Peripheral vascular disease, unspecified: Secondary | ICD-10-CM | POA: Diagnosis not present

## 2023-10-15 DIAGNOSIS — I2510 Atherosclerotic heart disease of native coronary artery without angina pectoris: Secondary | ICD-10-CM | POA: Diagnosis not present

## 2023-10-15 HISTORY — PX: RIGHT/LEFT HEART CATH AND CORONARY ANGIOGRAPHY: CATH118266

## 2023-10-15 HISTORY — PX: ABDOMINAL AORTOGRAM: CATH118222

## 2023-10-15 LAB — POCT I-STAT 7, (LYTES, BLD GAS, ICA,H+H)
Acid-Base Excess: 1 mmol/L (ref 0.0–2.0)
Bicarbonate: 26.4 mmol/L (ref 20.0–28.0)
Calcium, Ion: 1.15 mmol/L (ref 1.15–1.40)
HCT: 31 % — ABNORMAL LOW (ref 39.0–52.0)
Hemoglobin: 10.5 g/dL — ABNORMAL LOW (ref 13.0–17.0)
O2 Saturation: 97 %
Potassium: 5.5 mmol/L — ABNORMAL HIGH (ref 3.5–5.1)
Sodium: 128 mmol/L — ABNORMAL LOW (ref 135–145)
TCO2: 28 mmol/L (ref 22–32)
pCO2 arterial: 42.9 mmHg (ref 32–48)
pH, Arterial: 7.397 (ref 7.35–7.45)
pO2, Arterial: 89 mmHg (ref 83–108)

## 2023-10-15 LAB — BASIC METABOLIC PANEL WITH GFR
Anion gap: 10 (ref 5–15)
BUN: 44 mg/dL — ABNORMAL HIGH (ref 8–23)
CO2: 26 mmol/L (ref 22–32)
Calcium: 9 mg/dL (ref 8.9–10.3)
Chloride: 93 mmol/L — ABNORMAL LOW (ref 98–111)
Creatinine, Ser: 4.64 mg/dL — ABNORMAL HIGH (ref 0.61–1.24)
GFR, Estimated: 13 mL/min — ABNORMAL LOW (ref >60)
Glucose, Bld: 106 mg/dL — ABNORMAL HIGH (ref 70–99)
Potassium: 5.1 mmol/L (ref 3.5–5.1)
Sodium: 129 mmol/L — ABNORMAL LOW (ref 135–145)

## 2023-10-15 LAB — CBC
HCT: 32.6 % — ABNORMAL LOW (ref 39.0–52.0)
Hemoglobin: 10.9 g/dL — ABNORMAL LOW (ref 13.0–17.0)
MCH: 33.1 pg (ref 26.0–34.0)
MCHC: 33.4 g/dL (ref 30.0–36.0)
MCV: 99.1 fL (ref 80.0–100.0)
Platelets: 152 10*3/uL (ref 150–400)
RBC: 3.29 MIL/uL — ABNORMAL LOW (ref 4.22–5.81)
RDW: 13.7 % (ref 11.5–15.5)
WBC: 7.9 10*3/uL (ref 4.0–10.5)
nRBC: 0 % (ref 0.0–0.2)

## 2023-10-15 LAB — POCT I-STAT EG7
Acid-Base Excess: 2 mmol/L (ref 0.0–2.0)
Bicarbonate: 27.5 mmol/L (ref 20.0–28.0)
Calcium, Ion: 1.18 mmol/L (ref 1.15–1.40)
HCT: 32 % — ABNORMAL LOW (ref 39.0–52.0)
Hemoglobin: 10.9 g/dL — ABNORMAL LOW (ref 13.0–17.0)
O2 Saturation: 64 %
Potassium: 5.5 mmol/L — ABNORMAL HIGH (ref 3.5–5.1)
Sodium: 127 mmol/L — ABNORMAL LOW (ref 135–145)
TCO2: 29 mmol/L (ref 22–32)
pCO2, Ven: 48.7 mmHg (ref 44–60)
pH, Ven: 7.361 (ref 7.25–7.43)
pO2, Ven: 35 mmHg (ref 32–45)

## 2023-10-15 LAB — POCT ACTIVATED CLOTTING TIME: Activated Clotting Time: 147 s

## 2023-10-15 LAB — HEPARIN LEVEL (UNFRACTIONATED): Heparin Unfractionated: 0.15 [IU]/mL — ABNORMAL LOW (ref 0.30–0.70)

## 2023-10-15 MED ORDER — FENTANYL CITRATE (PF) 100 MCG/2ML IJ SOLN
INTRAMUSCULAR | Status: AC
  Filled 2023-10-15: qty 2

## 2023-10-15 MED ORDER — SODIUM CHLORIDE 0.9% FLUSH: 3.0000 mL | INTRAVENOUS | Status: DC | PRN

## 2023-10-15 MED ORDER — LIDOCAINE HCL (PF) 1 % IJ SOLN
INTRAMUSCULAR | Status: DC | PRN
  Administered 2023-10-15: 2 mL via INTRADERMAL
  Administered 2023-10-15: 10 mL via INTRADERMAL

## 2023-10-15 MED ORDER — MIDAZOLAM HCL 2 MG/2ML IJ SOLN
INTRAMUSCULAR | Status: DC | PRN
  Administered 2023-10-15: 1 mg via INTRAVENOUS

## 2023-10-15 MED ORDER — IOHEXOL 350 MG/ML SOLN
INTRAVENOUS | Status: DC | PRN
  Administered 2023-10-15: 47 mL

## 2023-10-15 MED ORDER — FENTANYL CITRATE (PF) 100 MCG/2ML IJ SOLN
INTRAMUSCULAR | Status: DC | PRN
  Administered 2023-10-15: 25 ug via INTRAVENOUS

## 2023-10-15 MED ORDER — ATROPINE SULFATE 1 MG/10ML IJ SOSY
PREFILLED_SYRINGE | INTRAMUSCULAR | Status: AC
  Filled 2023-10-15: qty 10

## 2023-10-15 MED ORDER — SODIUM CHLORIDE 0.9 % IV SOLN: 250.0000 mL | INTRAVENOUS | Status: AC | PRN

## 2023-10-15 MED ORDER — HEPARIN BOLUS VIA INFUSION
2000.0000 [IU] | Freq: Once | INTRAVENOUS | Status: AC
  Administered 2023-10-15: 2000 [IU] via INTRAVENOUS
  Filled 2023-10-15: qty 2000

## 2023-10-15 MED ORDER — MIDODRINE HCL 5 MG PO TABS
5.0000 mg | ORAL_TABLET | ORAL | Status: DC
  Administered 2023-10-18: 5 mg via ORAL
  Filled 2023-10-15: qty 1

## 2023-10-15 MED ORDER — MIDAZOLAM HCL 2 MG/2ML IJ SOLN
INTRAMUSCULAR | Status: AC
Start: 2023-10-15 — End: 2023-10-15
  Filled 2023-10-15: qty 2

## 2023-10-15 MED ORDER — HEPARIN (PORCINE) 25000 UT/250ML-% IV SOLN
1500.0000 [IU]/h | INTRAVENOUS | Status: DC
  Administered 2023-10-16: 1400 [IU]/h via INTRAVENOUS
  Administered 2023-10-17: 1450 [IU]/h via INTRAVENOUS
  Filled 2023-10-15 (×2): qty 250

## 2023-10-15 MED ORDER — HYDRALAZINE HCL 20 MG/ML IJ SOLN: 10.0000 mg | INTRAMUSCULAR | Status: AC | PRN

## 2023-10-15 MED ORDER — MIDODRINE HCL 5 MG PO TABS
10.0000 mg | ORAL_TABLET | ORAL | Status: DC
  Administered 2023-10-16 – 2023-10-17 (×2): 10 mg via ORAL
  Filled 2023-10-15 (×2): qty 2

## 2023-10-15 MED ORDER — SODIUM CHLORIDE 0.9% FLUSH
3.0000 mL | Freq: Two times a day (BID) | INTRAVENOUS | Status: DC
  Administered 2023-10-15 – 2023-10-21 (×11): 3 mL via INTRAVENOUS

## 2023-10-15 MED ORDER — LIDOCAINE HCL (PF) 1 % IJ SOLN
INTRAMUSCULAR | Status: AC
Start: 2023-10-15 — End: 2023-10-15
  Filled 2023-10-15: qty 30

## 2023-10-15 MED ORDER — MIDODRINE HCL 5 MG PO TABS: 10.0000 mg | ORAL_TABLET | ORAL | Status: DC

## 2023-10-15 MED ORDER — HEPARIN (PORCINE) IN NACL 1000-0.9 UT/500ML-% IV SOLN
INTRAVENOUS | Status: DC | PRN
  Administered 2023-10-15 (×2): 500 mL

## 2023-10-15 NOTE — TOC Progression Note (Signed)
 Transition of Care [facility]) - Progression Note    Patient Details  Name: James Chan MRN: [id number] Date of Birth: [birth year]  [facility] of [facility]) CM/SW Contact  Robynn Eileen Hoose, RN Phone Number: 10/15/2023, 10:44 AM  Clinical Narrative:   Patient off floor to cath lab.          Expected Discharge Plan and Services                                               Social Determinants of Health (SDOH) Interventions SDOH Screenings   Food Insecurity: No Food Insecurity (10/14/2023)  Housing: Low Risk  (10/14/2023)  Transportation Needs: No Transportation Needs (10/14/2023)  Utilities: Not At Risk (10/14/2023)  Social Connections: Moderately Isolated (10/14/2023)  Tobacco Use: Medium Risk (10/14/2023)    Readmission Risk Interventions     No data to display

## 2023-10-15 NOTE — TOC CM/SW Note (Signed)
 Transition of Care [facility]) - Inpatient Brief Assessment   Patient Details  Name: James Chan MRN: [id number] Date of Birth: 1950/07/01  Transition [facility]) CM/SW Contact:    Lauraine FORBES Saa, LCSW Phone Number: 10/15/2023, 9:21 AM   Clinical Narrative:  9:21 AM Per chart review, patient resides at home with spouse. Patient has a PCP and insurance. Patient does not have SNF or HH history. Patient has DME (shower stool) history with [employer]. Patient's preferred pharmacy's are [facility] [phone] [facility] and [facility]. No TOC needs were identified at this time. TOC will continue to follow and be available to assist.  Transition of Care Asessment: Insurance and Status: Insurance coverage has been reviewed Patient has primary care physician: Yes Home environment has been reviewed: Private Residence Prior level of function:: N/A Prior/Current Home Services: No current home services Social Drivers of Health Review: SDOH reviewed no interventions necessary Readmission risk has been reviewed: Yes Transition of care needs: no transition of care needs at this time

## 2023-10-15 NOTE — Plan of Care (Signed)

## 2023-10-15 NOTE — Progress Notes (Signed)
 PHARMACY - ANTICOAGULATION CONSULT NOTE  Pharmacy Consult for heparin  Indication: chest pain/ACS  Labs: Recent Labs    10/14/23 0557 10/14/23 0805 10/14/23 1537 10/15/23 0123  HGB 10.7*  --   --   --   HCT 32.4*  --   --   --   PLT 170  --   --   --   HEPARINUNFRC  --   --  <0.10* 0.15*  CREATININE 4.05*  --   --   --   TROPONINIHS 32* 35*  --   --    Assessment: 73yo male remains subtherapeutic on heparin  after rate change; no infusion issues or signs of bleeding per RN.  Goal of Therapy:  Heparin  level 0.3-0.7 units/ml   Plan:  2000 units heparin  bolus. Increase heparin  infusion by 3 units/kg/hr to 1200 units/hr. Check level in 8 hours.   Marvetta Dauphin, PharmD, BCPS 10/15/2023 1:54 AM

## 2023-10-15 NOTE — H&P (Signed)
 Progress Note  Patient Name: James Chan Date of Encounter: 10/15/2023 [facility] Cardiologist: Redell Leiter, MD   Interval Summary   Caucasian male patient with supine hypertension, orthostatic hypotension, diabetes mellitus with stage V chronic kidney disease presently on hemodialysis via right brachial AV fistula, hypercholesterolemia, PAD with right SFA stenting, LAD stenting in 2023, CAD presenting during COVID with recurrent VT VF, cardiac catheterization on 01/15/2020 revealing occluded RCA and distal circumflex stents that were placed remotely, moderate disease in left main and proximal LAD with LAD to RCA collaterals SP subcutaneous [employer] ICD placement recently seen by Dr. Inocencio on 10/08/2023 with recurrent ICD discharge due to recurrent VT VF requiring 6 shocks on 10/05/2023 patient unaware.  He is now brought via EMS with recurrent chest pain that started 2 days ago on and off requiring nitroglycerin  use with partial relief of chest pain, this morning chest pain persisted since 1130 at 12 midnight and had to use 3 sublingual nitroglycerin  and called the EMS.  He also states that last evening while he was sitting on the chair he had an episode of syncope but does not recollect any ICD discharge.  Presently chest pain-free.  Recently has been noticing worsening dizziness and mild associated shortness of breath with chest pain.   Vital Signs Vitals:   10/14/23 1654 10/14/23 2012 10/14/23 2322 10/15/23 0505  BP: 93/65 (!) 126/40 (!) 116/58 (!) 129/53  Pulse: 67 (!) 56 (!) 57 61  Resp:  18 19 16   Temp:  97.6 F (36.4 C) 98.8 F (37.1 C) 97.6 F (36.4 C)  TempSrc:  Axillary Oral Axillary  SpO2:  100% 98% 96%  Weight:    70.5 kg  Height:        Intake/Output Summary (Last 24 hours) at 10/15/2023 0708 Last data filed at 10/15/2023 0511 Gross per 24 hour  Intake 572.69 ml  Output 300 ml  Net 272.69 ml      10/15/2023    5:05 AM 10/14/2023    2:14 PM  10/14/2023    5:58 AM  Last 3 Weights  Weight (lbs) 155 lb 6.4 oz 154 lb 3.2 oz 152 lb 9.6 oz  Weight (kg) 70.489 kg 69.945 kg 69.219 kg      ECG  Normal sinus rhythm, LVH with repolarization abnormality.  No significant change from prior EKG.- Personally Reviewed   Coronary angiogram 01/15/20     1. Multivessel coronary artery disease, overall similar in appearance to prior catheterization from January.  Distal LMCA disease remains eccentric and noncritical with approximately 50% stenosis.  There is 50 to 60% stenosis of the proximal LAD involving D1 as well as chronic total occlusions of the distal LCx and ostial RCA. 2. Moderately elevated left ventricular filling pressure (LVEDP 25-30 mmHg).   Recommendations: 1. No acute coronary lesion to explain cardiac arrest with VT/VF.  Suspect arrhythmia may have been scar mediated in the setting of reduced LVEF and chronic ischemic heart disease.  Ongoing management per EP. 2. Continue aggressive secondary prevention. 3. Restart heparin  infusion 8 hours after removal of left femoral artery sheath.  Given paroxysmal atrial fibrillation, consider transitioning from dual antiplatelet therapy to anticoagulation plus antiplatelet agent at discharge.  ECHOCARDIOGRAM COMPLETE 07/13/2023  1. Left ventricular ejection fraction, by estimation, is 30 to 35%. The left ventricle has moderately decreased function. The left ventricle demonstrates global hypokinesis. The left ventricular internal cavity size was mildly dilated. Left ventricular diastolic parameters are consistent with Grade II diastolic dysfunction (  pseudonormalization). Elevated left ventricular end-diastolic pressure. The average left ventricular global longitudinal strain is -6.0 %. The global longitudinal strain is abnormal. 2. Right ventricular systolic function is low normal. The right ventricular size is mildly enlarged. 3. Tricuspid valve regurgitation is moderate and eccentric. 4. There  is severely elevated pulmonary artery systolic pressure. The estimated right ventricular systolic pressure is 77.6 mmHg. 5. Left atrial size was moderately dilated. 6. Right atrial size was moderately dilated. 7. The mitral valve is degenerative. Mild mitral valve regurgitation. No evidence of mitral stenosis. 8. Aortic valve regurgitation is trivial. No aortic stenosis is present. 9. The inferior vena cava is normal in size with <50% respiratory variability, suggesting right atrial pressure of 8 mmHg. 10 compared to 03/07/2022, EF is improved from 20 to 25%, moderately reduced RV function is now normal.   Review of Systems  Cardiovascular:  Positive for chest pain, dyspnea on exertion and syncope. Negative for claudication, leg swelling, orthopnea and palpitations.    Blood pressure (!) 129/53, pulse 61, temperature 97.6 F (36.4 C), temperature source Axillary, resp. rate 16, height 5' 6 (1.676 m), weight 70.5 kg, SpO2 96%. Physical Exam Neck:     Vascular: No carotid bruit or JVD.   Cardiovascular:     Rate and Rhythm: Normal rate and regular rhythm.     Pulses: Intact distal pulses.          Dorsalis pedis pulses are 0 on the right side and 0 on the left side.       Posterior tibial pulses are 0 on the right side and 0 on the left side.     Heart sounds: Normal heart sounds. No murmur heard.    No gallop.     Comments: Right arm AV fistula noted Pulmonary:     Effort: Pulmonary effort is normal.     Breath sounds: Normal breath sounds.  Abdominal:     General: Bowel sounds are normal.     Palpations: Abdomen is soft.   Musculoskeletal:     Right lower leg: No edema.     Left lower leg: No edema.     Assessment & Plan  1.  Coronary artery disease of the native vessel with unstable angina pectoris 2.  Recurrent VT VF arrest 3.  Chronic systolic heart failure 4.  Supine hypertension and orthostatic hypotension 5.  Diabetes mellitus diet controlled, off of all  medications since dialysis. 6.  Hypercholesterolemia 7. Patient is DNR and patient does not want any life prolonging measures like intubation and CPR.  Recommendations: Patient is presently chest pain-free, starting back on IV heparin , will discontinue Plavix  and switch him to Effient and continue aspirin , plan on repeat cardiac catheterization tomorrow after reviewing his medical history and see if LAD needs to be revascularized to improve his overall outcomes.  Patient has occluded CX and RCA to be left alone as noted in 2021 cardiac catheterization when he had COVID, appears to be chronic and does not appear to be abnormal for PCI.  Repeat echocardiogram, start him on low-dose of beta-blocker therapy and restart statins.  Patient presently not on any guideline directed medical therapy in view of severe orthostasis and difficulty during dialysis due to hypotension episodes.  Fortunately recently has not had any issues with dialysis.  Needs admission to telemetry and close monitoring.  With regard to recurrent VT VF, was recently evaluated by Dr. Inocencio on 10/08/2023 and recommended addition of mexiletine, patient has not been able to get this  in the pharmacy and is awaiting arrival however we will go ahead and initiate this.  Continue amiodarone  as well.  For questions or updates, please contact [facility] Please consult [email or web address] for contact info under       Signed,    Gordy Bergamo, MD, [facility] 10/15/2023, 7:08 AM [facility] [address] [place], [place] [address] Phone: [phone]. Fax:  [phone]

## 2023-10-15 NOTE — Consult Note (Addendum)
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 respiratory  variability, suggesting right atrial pressure of 8 mmHg.   Comparison(s): In comparison prior echocardiogram report from 03/07/2022  reported LVEF 20 to 25%, moderately reduced RV function, mild MR, mild  aortic insufficiency.   FINDINGS   Left Ventricle: Left ventricular ejection fraction, by estimation, is 30  to 35%. The left ventricle has moderately decreased function. The left  ventricle demonstrates global hypokinesis. The average left ventricular  global longitudinal strain is -6.0 %.   Strain was performed and the global longitudinal strain is abnormal. The  left ventricular internal cavity size was mildly dilated. There is no left  ventricular hypertrophy. Left ventricular diastolic parameters are  consistent with Grade II diastolic  dysfunction (pseudonormalization). Elevated left ventricular end-diastolic  pressure.   Right Ventricle: The right ventricular size is mildly enlarged. Right  vetricular wall thickness was not well visualized. Right  ventricular  systolic function is low normal. There is severely elevated pulmonary  artery systolic pressure. The tricuspid  regurgitant velocity is 4.17 m/s, and with an assumed right atrial  pressure of 8 mmHg, the estimated right ventricular systolic pressure is  77.6 mmHg.   Left Atrium: Left atrial size was moderately dilated.   Right Atrium: Right atrial  size was moderately dilated.   Pericardium: Trivial pericardial effusion is present. There is no evidence  of cardiac tamponade.   Mitral Valve: The mitral valve is degenerative in appearance. There is  moderate thickening of the mitral valve leaflet(s). Mild mitral annular  calcification. Mild mitral valve regurgitation. No evidence of mitral  valve stenosis. The mean mitral valve  gradient is 2.0 mmHg with average heart rate of 54 bpm.   Tricuspid Valve: The tricuspid valve is grossly normal. Tricuspid valve  regurgitation is moderate . No evidence of tricuspid stenosis.   Aortic Valve: The aortic valve is tricuspid. There is moderate thickening  of the aortic valve. Aortic valve regurgitation is trivial. No aortic  stenosis is present. Aortic valve mean gradient measures 3.0 mmHg. Aortic  valve peak gradient measures 6.1  mmHg. Aortic valve area, by VTI measures 2.03 cm.   Pulmonic Valve: The pulmonic valve was grossly normal. Pulmonic valve  regurgitation is mild. No evidence of pulmonic stenosis.   Aorta: The aortic root and ascending aorta are structurally normal, with  no evidence of dilitation.   Venous: The inferior vena cava is normal in size with less than 50%  respiratory variability, suggesting right atrial pressure of 8 mmHg.   IAS/Shunts: The interatrial septum was not well visualized.     LEFT VENTRICLE  PLAX 2D  LVIDd:         5.96 cm      Diastology  LVIDs:         5.10 cm      LV e' medial:    3.35 cm/s  LV PW:         1.23 cm      LV E/e' medial:  31.2  LV IVS:        1.46 cm      LV e'  lateral:   6.90 cm/s  LVOT diam:     2.20 cm      LV E/e' lateral: 15.1  LV SV:         66  LV SV Index:   34           2D Longitudinal Strain  LVOT Area:     3.80 cm     2D Strain GLS Avg:     -6.0 %    LV Volumes (MOD)  LV vol d, MOD A2C: 327.0 ml  LV vol d, MOD A4C: 270.0 ml  LV vol s, MOD A2C: 184.0 ml  LV vol s, MOD A4C: 179.0 ml  LV SV MOD A2C:     143.0 ml  LV SV MOD A4C:     270.0 ml  LV SV MOD BP:      106.2 ml   RIGHT VENTRICLE            IVC  RV Basal diam:  4.20 cm    IVC diam: 1.90 cm  RV Mid diam:    3.30 cm  RV S prime:     8.40 cm/s  TAPSE (M-mode): 1.8 cm   LEFT ATRIUM             Index        RIGHT ATRIUM           Index  LA diam:        3.90 cm 2.03 cm/m   RA Area:     26.30 cm  LA Vol (A2C):   77.9 ml 40.64 ml/m  RA Volume:   101.00 ml 52.69  ml/m  LA Vol (A4C):   81.0 ml 42.25 ml/m  LA Biplane Vol: 82.6 ml 43.09 ml/m   AORTIC VALVE                    PULMONIC VALVE  AV Area (Vmax):    2.01 cm     PR End Diast Vel: 8.18 msec  AV Area (Vmean):   1.97 cm  AV Area (VTI):     2.03 cm  AV Vmax:           123.00 cm/s  AV Vmean:          86.000 cm/s  AV VTI:            0.324 m  AV Peak Grad:      6.1 mmHg  AV Mean Grad:      3.0 mmHg  LVOT Vmax:         65.13 cm/s  LVOT Vmean:        44.533 cm/s  LVOT VTI:          0.173 m  LVOT/AV VTI ratio: 0.53    AORTA  Ao Root diam: 3.63 cm  Ao Asc diam:  3.40 cm  Ao Desc diam: 1.70 cm   MITRAL VALVE                TRICUSPID VALVE  MV Area (PHT): 3.60 cm     TR Peak grad:   69.6 mmHg  MV Area VTI:   1.70 cm     TR Vmax:        417.00 cm/s  MV Mean grad:  2.0 mmHg  MV VTI:        0.39 m       SHUNTS  MV Decel Time: 211 msec     Systemic VTI:  0.17 m  MR Peak grad: 43.0 mmHg     Systemic Diam: 2.20 cm  MR Vmax:      328.00 cm/s  MV E velocity: 104.50 cm/s  MV A velocity: 42.70 cm/s  MV E/A ratio:  2.45   Sreedhar reddy Madireddy  Electronically signed by Alean reddy Madireddy  Signature  Date/Time: 07/13/2023/5:44:05 PM        Final     RIGHT/LEFT HEART CATH AND CORONARY ANGIOGRAPHY  ABDOMINAL AORTOGRAM   Conclusions: Severe multivessel coronary artery disease, including 40% distal LMCA stenosis, 90% proximal LAD stenosis involving large D1 branch with heavy calcification, sequential 60% and 100% mid and distal LCx stenoses, and chronic total occlusion of ostial through distal RCA. Mildly-moderately elevated left heart filling pressures (LVEDP, PCWP 25 mmHg). Severely elevated right heart and pulmonary artery pressures (RA 16, PA 78/30, mean PAP 46 mmHg). Normal Fick cardiac output/index (CO 4.9 L/min, CI 2.7 L/min/m^2). Reduced thermodilution cardiac output/index (CO 3.8 L/min, CI 2.1 L/min/m^2). Calcified abdominal aorta with moderate luminal irregularities (distal aorta not well-visualized due to low cardiac output). Patent left common iliac stent with 30-40% in-stent restenosis.    Assessment & Plan: CAD with severe multivessel disease: The patient would benefit from surgical intervention but does not seem to be a good candidate due to multiple comorbidities. Dr. Lucas to ultimately determine surgical candidacy. Per patient and notes the patient was on Plavix  prior to admission and took it 06/21 but this is not in the patients home medication records or under medication history.  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  This 73 year old pt has severe multivessel CAD with 90% proximal LAD/diag bifurcation stenosis with heavy calcification, occluded mid to distal LCx beyond the OM branch and know CTO of a previously stented RCA with left to right collaterals. He also has visible  calcification of the ascending aorta on cine and previous CT of the chest in 2021 showed extensive calcification of the ascending aorta that would preclude CABG, in addition to the other comorbidities listed above. I think the only option for revascularization would be high risk PCI or consideration of medical therapy/palliative care. I discussed all of this with him and answered his questions. He will discuss further with cardiology.  Dorise LOIS Fellers, MD

## 2023-10-15 NOTE — Progress Notes (Signed)
 Patient stating he declines to go to HD tonight since it is so late in the day.  Patient in no distress.  Spoke with RN in HD and advised cards on call.  Will continue to monitor.

## 2023-10-15 NOTE — Progress Notes (Addendum)
 Rounding Note   Patient Name: James Chan Date of Encounter: 10/15/2023  [facility] Cardiologist: Redell Leiter, MD   Subjective  No chest pain this morning. Seen just before transported to lab.  Scheduled Meds:  [MAR Hold] allopurinol   100 mg Oral q1800   [MAR Hold] amiodarone   200 mg Oral Daily   [MAR Hold] aspirin  EC  81 mg Oral Daily   [MAR Hold] atorvastatin   40 mg Oral Daily   [MAR Hold] carvedilol  3.125 mg Oral BID WC   [MAR Hold] latanoprost   1 drop Both Eyes QHS   [MAR Hold] mexiletine  250 mg Oral BID   [MAR Hold] midodrine   2.5 mg Oral Once per day on Sunday Tuesday Thursday Saturday   [facility] Hold] midodrine   5 mg Oral Q M,W,F-HD   [MAR Hold] pyridoxine  100 mg Oral Q1500   [MAR Hold] torsemide   100 mg Oral Once per day on Sunday Tuesday Thursday Saturday   [facility] Hold] traZODone  50 mg Oral QHS   Continuous Infusions:  sodium chloride  10 mL/hr at 10/15/23 0511   heparin  1,200 Units/hr (10/15/23 0210)   [MAR Hold] nitroGLYCERIN  5 mcg/min (10/14/23 0828)   PRN Meds: [FJM Hold] acetaminophen , [MAR Hold] albuterol , [MAR Hold] nitroGLYCERIN , [MAR Hold] ondansetron  (ZOFRAN ) IV, [MAR Hold] oxyCODONE -acetaminophen    Vital Signs  Vitals:   10/14/23 2012 10/14/23 2322 10/15/23 0505 10/15/23 0838  BP: (!) 126/40 (!) 116/58 (!) 129/53   Pulse: (!) 56 (!) 57 61   Resp: 18 19 16    Temp: 97.6 F (36.4 C) 98.8 F (37.1 C) 97.6 F (36.4 C)   TempSrc: Axillary Oral Axillary   SpO2: 100% 98% 96% 98%  Weight:   70.5 kg   Height:        Intake/Output Summary (Last 24 hours) at 10/15/2023 0842 Last data filed at 10/15/2023 0511 Gross per 24 hour  Intake 572.69 ml  Output 300 ml  Net 272.69 ml      10/15/2023    5:05 AM 10/14/2023    2:14 PM 10/14/2023    5:58 AM  Last 3 Weights  Weight (lbs) 155 lb 6.4 oz 154 lb 3.2 oz 152 lb 9.6 oz  Weight (kg) 70.489 kg 69.945 kg 69.219 kg      Telemetry Sinus rhythm with HR 60-70s - Personally Reviewed  ECG  SR  HR 60, prolonged PR, IVCD - Personally Reviewed  Physical Exam  GEN: No acute distress.   Neck: No JVD Cardiac: RRR, no murmurs, rubs, or gallops.  Respiratory: Clear to auscultation bilaterally. GI: Soft, nontender, non-distended  MS: No edema; No deformity. Neuro:  Nonfocal  Psych: Normal affect   Labs High Sensitivity Troponin:   Recent Labs  Lab 10/14/23 0557 10/14/23 0805  TROPONINIHS 32* 35*     Chemistry Recent Labs  Lab 10/08/23 1640 10/14/23 0557 10/15/23 0549  NA 140 131* 129*  K 3.7 4.5 5.1  CL 97 96* 93*  CO2 27 26 26   GLUCOSE 120* 107* 106*  BUN 18 28* 44*  CREATININE 2.79* 4.05* 4.64*  CALCIUM  8.8 8.9 9.0  MG 2.3  --   --   PROT 6.2  --   --   ALBUMIN 3.7*  --   --   AST 18  --   --   ALT 14  --   --   ALKPHOS 112  --   --   BILITOT 1.0  --   --   GFRNONAA  --  15* 13*  ANIONGAP  --  9 10    Lipids No results for input(s): CHOL, TRIG, HDL, LABVLDL, LDLCALC, CHOLHDL in the last 168 hours.  Hematology Recent Labs  Lab 10/14/23 0557 10/15/23 0549  WBC 8.0 7.9  RBC 3.24* 3.29*  HGB 10.7* 10.9*  HCT 32.4* 32.6*  MCV 100.0 99.1  MCH 33.0 33.1  MCHC 33.0 33.4  RDW 13.7 13.7  PLT 170 152   Thyroid   Recent Labs  Lab 10/08/23 1640  TSH 1.460    BNPNo results for input(s): BNP, PROBNP in the last 168 hours.  DDimer No results for input(s): DDIMER in the last 168 hours.   Radiology  DG Chest Port 1 View Result Date: 10/14/2023 CLINICAL DATA:  Chest pain EXAM: PORTABLE CHEST 1 VIEW COMPARISON:  02/02/2020 FINDINGS: External AICD is unchanged with tip of lead in the projection of the inferior aspect of the aortic knob. Heart size is enlarged. There is a small to moderate right pleural effusion. Pulmonary vascular congestion. Decreased aeration to the right lower lobe scratch set decreased aeration to the right lower lung may reflect a combination of atelectasis and or airspace disease. IMPRESSION: 1. Cardiomegaly and pulmonary  vascular congestion. 2. Small to moderate right pleural effusion. 3. Decreased aeration to the right lower lung may reflect a combination of atelectasis and or airspace disease. Electronically Signed   By: Waddell Calk M.D.   On: 10/14/2023 06:36    Cardiac Studies  LHC scheduled today  Patient Profile    73 y.o. male with supine hypertension, orthostatic hypotension, diabetes mellitus with stage V chronic kidney disease presently on hemodialysis via right brachial AV fistula, hypercholesterolemia, PAD with right SFA stenting, LAD stenting in 2023, CAD presenting during COVID with recurrent VT VF, cardiac catheterization on 01/15/2020 revealing occluded RCA and distal circumflex stents that were placed remotely, moderate disease in left main and proximal LAD with LAD to RCA collaterals SP subcutaneous [employer] ICD placement recently seen by Dr. Inocencio on 10/08/2023 with recurrent ICD discharge due to recurrent VT VF requiring 6 shocks on 10/05/2023 patient unaware.   He is now admitted with recurrent chest pain requiring NTG.   Assessment & Plan   Chest pain CAD - occluded RCA with L-R collaterals - occluded distal LCX - moderate diesease in the proximal LAD and ostial diagonal - heparin  gtt running - no further chest pain this morning - reports ongoing exertional chest pain relieved with NTG +/- rest - will proceed with heart cath today - continue lipitor 40 mg --> consider increasing to 80 mg   VT/VF ICD in place Syncope - will interrogate device - pt denies shocks - mexiletine was not started as planned (waiting for pharmacy) -  this was started this admission - continue home amiodarone    Chronic systolic heart failure - LVEF 69-64% on echo 06/2003 - also with grade 2 DD, low normal RV function - takes 100 mg torsimide 4 times weekly - volume status managed by HD - requires midodrine    ESRD on HD - plan for HD after cath today       For questions or  updates, please contact [facility] Please consult [email or web address] for contact info under     Signed, Jon Nat Hails, PA  10/15/2023, 8:42 AM    Agree with note by Jon Hails PA-C  Patient mated with unstable angina.  Enzymes are negative.  He does have moderately severe LV dysfunction.  History of ICD implantation by  Dr. Inocencio.  He is a hemodialysis patient.  He had heart cath performed with Dr. Mady this morning revealing severe three-vessel disease.  His anatomy is more suitable for CABG although he is not an optimal bypass candidate.  Right heart cath that showed elevated wedge with V wave.  Repeat 2D echo is pending.  He is currently pain-free and IV nitro.  Heparin  was not restarted.  His cath was performed via left femoral approach.  If he is turned down for surgery he would need high risk LAD diagonal branch bifurcation PCI and stenting.  Dorn DOROTHA Lesches, M.D., FACP, [facility], LYNITA [facility] [address] [place], [place]  [address]  [phone] 10/15/2023 1:14 PM

## 2023-10-15 NOTE — Interval H&P Note (Signed)
 History and Physical Interval Note:  10/15/2023 8:40 AM  James Chan Letters  has presented today for surgery, with the diagnosis of unstable angina.  The various methods of treatment have been discussed with the patient and family. After consideration of risks, benefits and other options for treatment, the patient has consented to  Procedure(s): LEFT HEART CATH AND CORONARY ANGIOGRAPHY (N/A) as a surgical intervention.  The patient's history has been reviewed, patient examined, no change in status, stable for surgery.  I have reviewed the patient's chart and labs.  Questions were answered to the patient's satisfaction.    Cath Lab Visit (complete for each Cath Lab visit)  Clinical Evaluation Leading to the Procedure:   ACS: Yes.    Non-ACS:  N/A  Vali Capano

## 2023-10-15 NOTE — Progress Notes (Signed)
  [facility] Progress Note   Subjective: Seen in room. Cardiac cath this am showed severe multivessel CAD. Cardiothoracic surgery consulted. Lying flat in bed waiting for dialysis. No c/os.   Objective Vitals:   10/15/23 1115 10/15/23 1130 10/15/23 1145 10/15/23 1200  BP: (!) 116/103 126/60 (!) 131/51 122/64  Pulse: (!) 56 (!) 57 (!) 57 (!) 58  Resp: 16 16 16 14   Temp:      TempSrc:      SpO2: 94% 98% 97% 98%  Weight:      Height:         Additional Objective Labs: Basic Metabolic Panel: Recent Labs  Lab 10/08/23 1640 10/14/23 0557 10/15/23 0549  NA 140 131* 129*  K 3.7 4.5 5.1  CL 97 96* 93*  CO2 27 26 26   GLUCOSE 120* 107* 106*  BUN 18 28* 44*  CREATININE 2.79* 4.05* 4.64*  CALCIUM  8.8 8.9 9.0   CBC: Recent Labs  Lab 10/14/23 0557 10/15/23 0549  WBC 8.0 7.9  NEUTROABS 6.1  --   HGB 10.7* 10.9*  HCT 32.4* 32.6*  MCV 100.0 99.1  PLT 170 152   Blood Culture No results found for: SDES, SPECREQUEST, CULT, REPTSTATUS   Physical Exam General: Lying bed, nad Heart: RRR Lungs: Clear Abdomen: soft, non tender Extremities: no LE edema Dialysis Access: R AVF +T/B  Medications:  sodium chloride      heparin       allopurinol   100 mg Oral q1800   amiodarone   200 mg Oral Daily   aspirin  EC  81 mg Oral Daily   atorvastatin   40 mg Oral Daily   carvedilol  3.125 mg Oral BID WC   latanoprost   1 drop Both Eyes QHS   mexiletine  250 mg Oral BID   [START ON 10/17/2023] midodrine   10 mg Oral Q M,W,F-HD   [START ON 10/16/2023] midodrine   5 mg Oral Once per day on Sunday Tuesday Thursday Saturday   pyridoxine  100 mg Oral Q1500   sodium chloride  flush  3 mL Intravenous Q12H   torsemide   100 mg Oral Once per day on Sunday Tuesday Thursday Saturday   traZODone  50 mg Oral QHS    Dialysis Orders:  MWF - [facility] 3:30hr, 400/A1.5, EDW 67.3kg, 2K/2.5Ca, UFP #2, AVF, no heparin  - No ESA - Calcitriol  0.51mcg PO q HD - Sensipar 30mg  PO q HD    Assessment/Plan: CAD/Unstable angina. Cardiology following, s/p LHC 6/23 with severe multivessel disease. CTS consulted. Unclear if he is a suitable candidate for surgery.  Hx VT/VF.  Unstable recently. Per cards note, pursuing mexilitine this admit. ESRD:  Continue HD on usual MWF schedule - next Mon 6/23. No added heparin  with HD (on drip). Hypotension/volume: BP ok, on low dose Coreg and uses midodrine  pre-HD. No LE edema, some vol on CXR.  Anemia: Hgb 10.9 - follow without ESA.  Metabolic bone disease: Ca ok, continue home meds.  T2DM  HFrEF, severe CAD  COPD  Hx CVA  Roann Merk Ronnald Acosta PA-C [facility] 10/15/2023,2:07 PM

## 2023-10-15 NOTE — Progress Notes (Signed)
 [facility] Interventional Cardiology Note  Date: 10/15/23  Time: 8:41 AM  Patient presents for cardiac catheterization with possible PCI for evaluation of unstable angina.  He has agreed to rescind his limited DNR for the procedure and will be full code until leaving the cath lab.  Lonni Hanson, MD [facility]

## 2023-10-15 NOTE — Progress Notes (Signed)
 Pt receives out-pt HD at [facility] on MWF 9:00 am chair time. Will assist as needed.   Randine Mungo Renal Navigator [phone]

## 2023-10-15 NOTE — Progress Notes (Signed)
 5 Fr Arterial sheath removed from left femoral artery. Manual pressure held for 25 minutes with hemostasis obtained. Site is level 0. 7Fr Venous sheath removed from left femoral vein. Manual pressure held for 20 minutes and hemostasis obtained. Site is level 0. Vital signs stable. Patient tolerated well. Post instructions given for bedrest in teach back method, with verbal understanding given by patient.
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
## 2023-10-16 ENCOUNTER — Inpatient Hospital Stay (HOSPITAL_COMMUNITY)

## 2023-10-16 DIAGNOSIS — I272 Pulmonary hypertension, unspecified: Secondary | ICD-10-CM

## 2023-10-16 DIAGNOSIS — I5189 Other ill-defined heart diseases: Secondary | ICD-10-CM

## 2023-10-16 DIAGNOSIS — I25110 Atherosclerotic heart disease of native coronary artery with unstable angina pectoris: Secondary | ICD-10-CM | POA: Diagnosis not present

## 2023-10-16 DIAGNOSIS — I2489 Other forms of acute ischemic heart disease: Secondary | ICD-10-CM

## 2023-10-16 LAB — ECHOCARDIOGRAM COMPLETE
AR max vel: 1.41 cm2
AV Area VTI: 1.17 cm2
AV Area mean vel: 1.31 cm2
AV Mean grad: 3 mmHg
AV Peak grad: 6 mmHg
Ao pk vel: 1.22 m/s
Area-P 1/2: 4.15 cm2
Calc EF: 17.4 %
Est EF: <20
Height: 66 in
S' Lateral: 6 cm
Single Plane A2C EF: 15.4 %
Single Plane A4C EF: 18.7 %
Weight: 2560.86 [oz_av]

## 2023-10-16 LAB — BASIC METABOLIC PANEL WITH GFR
Anion gap: 8 (ref 5–15)
BUN: 58 mg/dL — ABNORMAL HIGH (ref 8–23)
CO2: 25 mmol/L (ref 22–32)
Calcium: 8.7 mg/dL — ABNORMAL LOW (ref 8.9–10.3)
Chloride: 93 mmol/L — ABNORMAL LOW (ref 98–111)
Creatinine, Ser: 5.27 mg/dL — ABNORMAL HIGH (ref 0.61–1.24)
GFR, Estimated: 11 mL/min — ABNORMAL LOW (ref >60)
Glucose, Bld: 102 mg/dL — ABNORMAL HIGH (ref 70–99)
Potassium: 5.2 mmol/L — ABNORMAL HIGH (ref 3.5–5.1)
Sodium: 126 mmol/L — ABNORMAL LOW (ref 135–145)

## 2023-10-16 LAB — HEPARIN LEVEL (UNFRACTIONATED)
Heparin Unfractionated: 0.23 [IU]/mL — ABNORMAL LOW (ref 0.30–0.70)
Heparin Unfractionated: 0.38 [IU]/mL (ref 0.30–0.70)

## 2023-10-16 LAB — CBC
HCT: 31.4 % — ABNORMAL LOW (ref 39.0–52.0)
Hemoglobin: 10.4 g/dL — ABNORMAL LOW (ref 13.0–17.0)
MCH: 32.9 pg (ref 26.0–34.0)
MCHC: 33.1 g/dL (ref 30.0–36.0)
MCV: 99.4 fL (ref 80.0–100.0)
Platelets: 146 10*3/uL — ABNORMAL LOW (ref 150–400)
RBC: 3.16 MIL/uL — ABNORMAL LOW (ref 4.22–5.81)
RDW: 13.6 % (ref 11.5–15.5)
WBC: 8.5 10*3/uL (ref 4.0–10.5)
nRBC: 0 % (ref 0.0–0.2)

## 2023-10-16 LAB — HEPATITIS B SURFACE ANTIBODY, QUANTITATIVE: Hep B S AB Quant (Post): <3.5 m[IU]/mL — ABNORMAL LOW

## 2023-10-16 LAB — LIPOPROTEIN A (LPA): Lipoprotein (a): 226.7 nmol/L — ABNORMAL HIGH (ref <75.0)

## 2023-10-16 MED ORDER — ANTICOAGULANT SODIUM CITRATE 4% (200MG/5ML) IV SOLN
5.0000 mL | Status: DC | PRN
Start: 2023-10-16 — End: 2023-10-16

## 2023-10-16 MED ORDER — LIDOCAINE-PRILOCAINE 2.5-2.5 % EX CREA: 1.0000 | TOPICAL_CREAM | CUTANEOUS | Status: DC | PRN

## 2023-10-16 MED ORDER — HEPARIN SODIUM (PORCINE) 1000 UNIT/ML DIALYSIS: 1000.0000 [IU] | INTRAMUSCULAR | Status: DC | PRN

## 2023-10-16 MED ORDER — ASPIRIN 81 MG PO CHEW
81.0000 mg | CHEWABLE_TABLET | ORAL | Status: AC
  Administered 2023-10-17: 81 mg via ORAL
  Filled 2023-10-16: qty 1

## 2023-10-16 MED ORDER — SODIUM CHLORIDE 0.9 % IV SOLN: INTRAVENOUS | Status: DC

## 2023-10-16 MED ORDER — LIDOCAINE HCL (PF) 1 % IJ SOLN
5.0000 mL | INTRAMUSCULAR | Status: DC | PRN
Start: 2023-10-16 — End: 2023-10-16

## 2023-10-16 MED ORDER — PENTAFLUOROPROP-TETRAFLUOROETH EX AERO: 1.0000 | INHALATION_SPRAY | CUTANEOUS | Status: DC | PRN

## 2023-10-16 NOTE — H&P (View-Only) (Signed)
 Rounding Note   Patient Name: James Chan Date of Encounter: 10/16/2023  [facility] Cardiologist: Redell Leiter, MD   Subjective Patient currently in hemodialysis.  Denies chest pain or shortness of breath.  Scheduled Meds:  allopurinol   100 mg Oral q1800   amiodarone   200 mg Oral Daily   aspirin  EC  81 mg Oral Daily   atorvastatin   40 mg Oral Daily   carvedilol  3.125 mg Oral BID WC   latanoprost   1 drop Both Eyes QHS   mexiletine  250 mg Oral BID   midodrine   10 mg Oral Q M,W,F-HD   midodrine   5 mg Oral Once per day on Sunday Tuesday Thursday Saturday   pyridoxine  100 mg Oral Q1500   sodium chloride  flush  3 mL Intravenous Q12H   torsemide   100 mg Oral Once per day on Sunday Tuesday Thursday Saturday   traZODone  50 mg Oral QHS   Continuous Infusions:  sodium chloride      anticoagulant sodium citrate     heparin  1,400 Units/hr (10/16/23 0828)   PRN Meds: sodium chloride , acetaminophen , albuterol , anticoagulant sodium citrate, heparin , lidocaine  (PF), lidocaine -prilocaine , nitroGLYCERIN , ondansetron  (ZOFRAN ) IV, oxyCODONE -acetaminophen , pentafluoroprop-tetrafluoroeth, sodium chloride  flush   Vital Signs  Vitals:   10/16/23 1030 10/16/23 1100 10/16/23 1130 10/16/23 1147  BP: (!) 145/118 137/78 (!) 143/54 (!) 147/55  Pulse: 60 62 (!) 59 62  Resp: 17 16 12 12   Temp:      TempSrc:      SpO2: 95% 100% 98% 100%  Weight:      Height:        Intake/Output Summary (Last 24 hours) at 10/16/2023 1151 Last data filed at 10/16/2023 9171 Gross per 24 hour  Intake 723.64 ml  Output --  Net 723.64 ml      10/16/2023    7:56 AM 10/15/2023    5:05 AM 10/14/2023    2:14 PM  Last 3 Weights  Weight (lbs) 160 lb 0.9 oz 155 lb 6.4 oz 154 lb 3.2 oz  Weight (kg) 72.6 kg 70.489 kg 69.945 kg      Telemetry Sinus rhythm- Personally Reviewed  ECG  Not performed today- Personally Reviewed  Physical Exam  GEN: No acute distress.   Neck: No JVD Cardiac: RRR, no  murmurs, rubs, or gallops.  Respiratory: Clear to auscultation bilaterally. GI: Soft, nontender, non-distended  MS: No edema; No deformity. Neuro:  Nonfocal  Psych: Normal affect   Labs High Sensitivity Troponin:   Recent Labs  Lab 10/14/23 0557 10/14/23 0805  TROPONINIHS 32* 35*     Chemistry Recent Labs  Lab 10/14/23 0557 10/15/23 0549 10/15/23 0938 10/16/23 0404  NA 131* 129* 127*  128* 126*  K 4.5 5.1 5.5*  5.5* 5.2*  CL 96* 93*  --  93*  CO2 26 26  --  25  GLUCOSE 107* 106*  --  102*  BUN 28* 44*  --  58*  CREATININE 4.05* 4.64*  --  5.27*  CALCIUM  8.9 9.0  --  8.7*  GFRNONAA 15* 13*  --  11*  ANIONGAP 9 10  --  8    Lipids No results for input(s): CHOL, TRIG, HDL, LABVLDL, LDLCALC, CHOLHDL in the last 168 hours.  Hematology Recent Labs  Lab 10/14/23 0557 10/15/23 0549 10/15/23 0938 10/16/23 0404  WBC 8.0 7.9  --  8.5  RBC 3.24* 3.29*  --  3.16*  HGB 10.7* 10.9* 10.9*  10.5* 10.4*  HCT 32.4* 32.6*  32.0*  31.0* 31.4*  MCV 100.0 99.1  --  99.4  MCH 33.0 33.1  --  32.9  MCHC 33.0 33.4  --  33.1  RDW 13.7 13.7  --  13.6  PLT 170 152  --  146*   Thyroid  No results for input(s): TSH, FREET4 in the last 168 hours.  BNPNo results for input(s): BNP, PROBNP in the last 168 hours.  DDimer No results for input(s): DDIMER in the last 168 hours.   Radiology  ECHOCARDIOGRAM COMPLETE Result Date: 10/16/2023    ECHOCARDIOGRAM REPORT   Patient Name:   DALBERT STILLINGS Date of Exam: 10/16/2023 Medical Rec #:  [id number]        Height:       66.0 in Accession #:    [id number]       Weight:       160.1 lb Date of Birth:  01-23-1951       BSA:          1.819 m Patient Age:    73 years         BP:           142/62 mmHg Patient Gender: M                HR:           61 bpm. Exam Location:  Inpatient Procedure: 2D Echo, Cardiac Doppler and Color Doppler (Both Spectral and Color            Flow Doppler were utilized during procedure). Indications:    Acute  Ischemic heart disease  History:        Patient has prior history of Echocardiogram examinations, most                 recent 07/13/2023. CAD.  Sonographer:    Benard Stallion Referring Phys: [phone] CHRISTOPHER END IMPRESSIONS  1. Left ventricular ejection fraction, by estimation, is <20%. The left ventricle has severely decreased function. The left ventricle demonstrates global hypokinesis. The left ventricular internal cavity size was moderately dilated. There is mild concentric left ventricular hypertrophy. Left ventricular diastolic parameters are consistent with Grade II diastolic dysfunction (pseudonormalization).  2. Right ventricular systolic function is mildly reduced. The right ventricular size is mildly enlarged. There is severely elevated pulmonary artery systolic pressure.  3. Left atrial size was severely dilated.  4. Right atrial size was severely dilated.  5. The mitral valve is abnormal. Trivial mitral valve regurgitation. No evidence of mitral stenosis.  6. Eccentric jet.  7. The aortic valve is tricuspid. There is moderate calcification of the aortic valve. Aortic valve regurgitation is trivial. Aortic valve sclerosis is present, with no evidence of aortic valve stenosis. Comparison(s): Prior images reviewed side by side. Pericardial effusion has decreased. FINDINGS  Left Ventricle: Left ventricular ejection fraction, by estimation, is <20%. The left ventricle has severely decreased function. The left ventricle demonstrates global hypokinesis. The left ventricular internal cavity size was moderately dilated. There is mild concentric left ventricular hypertrophy. Left ventricular diastolic parameters are consistent with Grade II diastolic dysfunction (pseudonormalization). Right Ventricle: The right ventricular size is mildly enlarged. No increase in right ventricular wall thickness. Right ventricular systolic function is mildly reduced. There is severely elevated pulmonary artery systolic pressure.  The tricuspid regurgitant velocity is 3.85 m/s, and with an assumed right atrial pressure of 8 mmHg, the estimated right ventricular systolic pressure is 67.3 mmHg. Left Atrium: Left atrial size was severely dilated. Right Atrium: Right  atrial size was severely dilated. Pericardium: Trivial pericardial effusion is present. The pericardial effusion is anterior to the right ventricle. Mitral Valve: The mitral valve is abnormal. Trivial mitral valve regurgitation. No evidence of mitral valve stenosis. Tricuspid Valve: Eccentric jet. The tricuspid valve is normal in structure. Tricuspid valve regurgitation is mild . No evidence of tricuspid stenosis. Aortic Valve: The aortic valve is tricuspid. There is moderate calcification of the aortic valve. Aortic valve regurgitation is trivial. Aortic valve sclerosis is present, with no evidence of aortic valve stenosis. Aortic valve mean gradient measures 3.0  mmHg. Aortic valve peak gradient measures 6.0 mmHg. Aortic valve area, by VTI measures 1.17 cm. Pulmonic Valve: The pulmonic valve was normal in structure. Pulmonic valve regurgitation is trivial. No evidence of pulmonic stenosis. Aorta: The aortic root and ascending aorta are structurally normal, with no evidence of dilitation. IAS/Shunts: The atrial septum is grossly normal.  LEFT VENTRICLE PLAX 2D LVIDd:         6.40 cm      Diastology LVIDs:         6.00 cm      LV e' medial:    2.28 cm/s LV PW:         1.10 cm      LV E/e' medial:  38.3 LV IVS:        1.10 cm      LV e' lateral:   5.11 cm/s LVOT diam:     2.20 cm      LV E/e' lateral: 17.1 LV SV:         38 LV SV Index:   21 LVOT Area:     3.80 cm  LV Volumes (MOD) LV vol d, MOD A2C: 280.0 ml LV vol d, MOD A4C: 225.0 ml LV vol s, MOD A2C: 237.0 ml LV vol s, MOD A4C: 183.0 ml LV SV MOD A2C:     43.0 ml LV SV MOD A4C:     225.0 ml LV SV MOD BP:      44.0 ml RIGHT VENTRICLE RV Basal diam:  4.20 cm RV Mid diam:    3.10 cm RV S prime:     7.51 cm/s TAPSE (M-mode): 1.5 cm  LEFT ATRIUM             Index        RIGHT ATRIUM           Index LA diam:        4.40 cm 2.42 cm/m   RA Area:     26.30 cm LA Vol (A2C):   86.7 ml 47.66 ml/m  RA Volume:   92.80 ml  51.01 ml/m LA Vol (A4C):   83.3 ml 45.79 ml/m LA Biplane Vol: 94.7 ml 52.06 ml/m  AORTIC VALVE AV Area (Vmax):    1.41 cm AV Area (Vmean):   1.31 cm AV Area (VTI):     1.17 cm AV Vmax:           122.00 cm/s AV Vmean:          85.500 cm/s AV VTI:            0.321 m AV Peak Grad:      6.0 mmHg AV Mean Grad:      3.0 mmHg LVOT Vmax:         45.30 cm/s LVOT Vmean:        29.400 cm/s LVOT VTI:          0.099 m LVOT/AV  VTI ratio: 0.31  AORTA Ao Root diam: 3.10 cm Ao Asc diam:  3.50 cm MITRAL VALVE               TRICUSPID VALVE MV Area (PHT): 4.15 cm    TR Peak grad:   59.3 mmHg MV Decel Time: 183 msec    TR Vmax:        385.00 cm/s MV E velocity: 87.30 cm/s MV A velocity: 56.80 cm/s  SHUNTS MV E/A ratio:  1.54        Systemic VTI:  0.10 m                            Systemic Diam: 2.20 cm Stanly Leavens MD Electronically signed by Stanly Leavens MD Signature Date/Time: 10/16/2023/11:21:59 AM    Final    CARDIAC CATHETERIZATION Result Date: 10/15/2023 Conclusions: Severe multivessel coronary artery disease, including 40% distal LMCA stenosis, 90% proximal LAD stenosis involving large D1 branch with heavy calcification, sequential 60% and 100% mid and distal LCx stenoses, and chronic total occlusion of ostial through distal RCA. Mildly-moderately elevated left heart filling pressures (LVEDP, PCWP 25 mmHg). Severely elevated right heart and pulmonary artery pressures (RA 16, PA 78/30, mean PAP 46 mmHg). Normal Fick cardiac output/index (CO 4.9 L/min, CI 2.7 L/min/m^2). Reduced thermodilution cardiac output/index (CO 3.8 L/min, CI 2.1 L/min/m^2). Calcified abdominal aorta with moderate luminal irregularities (distal aorta not well-visualized due to low cardiac output). Patent left common iliac stent with 30-40% in-stent  restenosis. Recommendations: Cardiac surgery consultation given significant multivessel disease and complex proximal LAD/D1 lesion.  If the patient is not a candidate for surgery, high-risk PCI to LAD/D1 versus palliative medical therapy will need to be considered. Resume heparin  8 hours after femoral hemostasis has been achieved. Proceed with hemodialysis today and optimize chronic HFrEF; consultation with advanced heart failure team may be helpful. Repeat echocardiogram to reassess LVEF and mitral regurgitation; prominent v-waves suggest significant mitral regurgitation. Aggressive secondary prevention of coronary artery disease. Lonni Hanson, MD [facility]  PERIPHERAL VASCULAR CATHETERIZATION Result Date: 10/15/2023 Conclusions: Severe multivessel coronary artery disease, including 40% distal LMCA stenosis, 90% proximal LAD stenosis involving large D1 branch with heavy calcification, sequential 60% and 100% mid and distal LCx stenoses, and chronic total occlusion of ostial through distal RCA. Mildly-moderately elevated left heart filling pressures (LVEDP, PCWP 25 mmHg). Severely elevated right heart and pulmonary artery pressures (RA 16, PA 78/30, mean PAP 46 mmHg). Normal Fick cardiac output/index (CO 4.9 L/min, CI 2.7 L/min/m^2). Reduced thermodilution cardiac output/index (CO 3.8 L/min, CI 2.1 L/min/m^2). Calcified abdominal aorta with moderate luminal irregularities (distal aorta not well-visualized due to low cardiac output). Patent left common iliac stent with 30-40% in-stent restenosis. Recommendations: Cardiac surgery consultation given significant multivessel disease and complex proximal LAD/D1 lesion.  If the patient is not a candidate for surgery, high-risk PCI to LAD/D1 versus palliative medical therapy will need to be considered. Resume heparin  8 hours after femoral hemostasis has been achieved. Proceed with hemodialysis today and optimize chronic HFrEF; consultation with advanced heart  failure team may be helpful. Repeat echocardiogram to reassess LVEF and mitral regurgitation; prominent v-waves suggest significant mitral regurgitation. Aggressive secondary prevention of coronary artery disease. Lonni Hanson, MD [facility]   Cardiac Studies  2D echocardiogram (10/12/2023)  IMPRESSIONS     1. Left ventricular ejection fraction, by estimation, is <20%. The left  ventricle has severely decreased function. The left ventricle demonstrates  global hypokinesis.  The left ventricular internal cavity size was  moderately dilated. There is mild  concentric left ventricular hypertrophy. Left ventricular diastolic  parameters are consistent with Grade II diastolic dysfunction  (pseudonormalization).   2. Right ventricular systolic function is mildly reduced. The right  ventricular size is mildly enlarged. There is severely elevated pulmonary  artery systolic pressure.   3. Left atrial size was severely dilated.   4. Right atrial size was severely dilated.   5. The mitral valve is abnormal. Trivial mitral valve regurgitation. No  evidence of mitral stenosis.   6. Eccentric jet.   7. The aortic valve is tricuspid. There is moderate calcification of the  aortic valve. Aortic valve regurgitation is trivial. Aortic valve  sclerosis is present, with no evidence of aortic valve stenosis.   Cardiac catheterization (10/15/2023)  Conclusions: Severe multivessel coronary artery disease, including 40% distal LMCA stenosis, 90% proximal LAD stenosis involving large D1 branch with heavy calcification, sequential 60% and 100% mid and distal LCx stenoses, and chronic total occlusion of ostial through distal RCA. Mildly-moderately elevated left heart filling pressures (LVEDP, PCWP 25 mmHg). Severely elevated right heart and pulmonary artery pressures (RA 16, PA 78/30, mean PAP 46 mmHg). Normal Fick cardiac output/index (CO 4.9 L/min, CI 2.7 L/min/m^2). Reduced thermodilution cardiac  output/index (CO 3.8 L/min, CI 2.1 L/min/m^2). Calcified abdominal aorta with moderate luminal irregularities (distal aorta not well-visualized due to low cardiac output). Patent left common iliac stent with 30-40% in-stent restenosis.   Recommendations: Cardiac surgery consultation given significant multivessel disease and complex proximal LAD/D1 lesion.  If the patient is not a candidate for surgery, high-risk PCI to LAD/D1 versus palliative medical therapy will need to be considered. Resume heparin  8 hours after femoral hemostasis has been achieved. Proceed with hemodialysis today and optimize chronic HFrEF; consultation with advanced heart failure team may be helpful. Repeat echocardiogram to reassess LVEF and mitral regurgitation; prominent v-waves suggest significant mitral regurgitation. Aggressive secondary prevention of coronary artery disease.   Lonni Hanson, MD [facility]  onary Diagrams  Diagnostic Dominance: Right  Intervention Patient Profile   73 y.o. male with supine hypertension, orthostatic hypotension, diabetes mellitus with stage V chronic kidney disease presently on hemodialysis via right brachial AV fistula, hypercholesterolemia, PAD with right SFA stenting, LAD stenting in 2023, CAD presenting during COVID with recurrent VT VF, cardiac catheterization on 01/15/2020 revealing occluded RCA and distal circumflex stents that were placed remotely, moderate disease in left main and proximal LAD with LAD to RCA collaterals SP subcutaneous [employer] ICD placement recently seen by Dr. Inocencio on 10/08/2023 with recurrent ICD discharge due to recurrent VT VF requiring 6 shocks on 10/05/2023 patient unaware.   Assessment & Plan  1: CAD-patient mated with unstable angina.  Enzymes were negative.  Right left heart cath performed by Dr. Hanson yesterday revealed three-vessel disease with an occluded dominant RCA with left-to-right collaterals, occluded circumflex in the  midportion which is nondominant and high-grade calcified proximal LAD diagonal branch bifurcation disease.  He was seen by Dr. Lucas for consideration of CABG and was turned down because he was not a good operative candidate.  His recommendation was high risk PCI versus medical therapy/palliative care.  He remains on IV heparin .  He has had no pain since he is been here.  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  Unfortunately, this poses  a difficult situation.  He does have left main equivalent with three-vessel disease and high-grade proximal LAD diagonal branch calcified bifurcation disease which is his only percutaneously addressable vessel.  He has severe LV dysfunction, pulmonary hypertension and PAD with bilateral iliac stents although they were demonstrated to be patent.  He is high risk PCI of his proximal LAD diagonal branch which most likely would require adjunctive therapy such as orbital atherectomy.  Ideally, he probably would require mechanical circulatory support but this may be problematic with his iliac stents.  I discussed the case with Dr.Thukkani who is agreed to review the data and decide whether or not to proceed with intervention.     For questions or updates, please contact [facility] Please consult [email or web address] for contact info under     Signed, Dorn Lesches, MD  10/16/2023, 11:51 AM

## 2023-10-16 NOTE — Progress Notes (Signed)
 Remote ICD transmission.

## 2023-10-16 NOTE — Progress Notes (Signed)
 PHARMACY - ANTICOAGULATION CONSULT NOTE  Pharmacy Consult for heparin  Indication: CAD  Labs: Recent Labs    10/14/23 0557 10/14/23 0805 10/14/23 1537 10/15/23 0123 10/15/23 0549 10/15/23 0938 10/16/23 0404  HGB 10.7*  --   --   --  10.9* 10.9*  10.5* 10.4*  HCT 32.4*  --   --   --  32.6* 32.0*  31.0* 31.4*  PLT 170  --   --   --  152  --  146*  HEPARINUNFRC  --   --  <0.10* 0.15*  --   --  0.23*  CREATININE 4.05*  --   --   --  4.64*  --   --   TROPONINIHS 32* 35*  --   --   --   --   --    Assessment: 73yo male remains therapeutic on heparin  after resumed post-cath; no infusion issues or signs of bleeding per RN.  Goal of Therapy:  Heparin  level 0.3-0.7 units/ml   Plan:  Increase heparin  infusion by 2-3 units/kg/hr to 1400 units/hr. Check level in 8 hours.   Marvetta Dauphin, PharmD, BCPS 10/16/2023 5:41 AM

## 2023-10-16 NOTE — Progress Notes (Signed)
*  PRELIMINARY RESULTS* Echocardiogram 2D Echocardiogram has been performed.  James Chan 10/16/2023, 10:31 AM

## 2023-10-16 NOTE — Progress Notes (Signed)
 IC note:  Asked by Dr. Court to review patient's angiogram and chart for possible HRPCI.  In brief the patient is a 73 year old male with a history of hypertension, end-stage renal disease on dialysis, peripheral arterial disease status post right SFA stenting and previous right common iliac and external iliac stenting, drug-coated balloon angioplasty of left SFA, coronary artery disease status post multiple stents of the right coronary artery which is now occluded, ischemic cardiomyopathy with ejection fraction of around 20% status post ICD who is here with an acute coronary syndrome.  He underwent coronary angiography from the left femoral approach recently which demonstrated [place] 111 disease of the proximal LAD and first diagonal.  The arteries are relatively calcified.  I did have a long discussion with the patient and his wife who was contacted on the phone.  I explained that the patient has 2 options chiefly medical therapy and hospice versus high risk PCI.  High risk PCI would include Impella support which I would plan on placing likely through the left femoral artery.  Additionally high risk PCI would require atherectomy of both the LAD and diagonal branches as well as a 2 stent strategy with the culotte technique.  I explained the risks of the procedure including vascular injury, stroke, heart attack, cardiac arrest, and death.  I quoted them a risk of around 5 to 10% given his serious comorbidities and the complexity of the procedure.  After answering all questions the patient has elected to move forward with this procedure which is scheduled for tomorrow afternoon.  We will plan on doing a right heart catheterization initially through the right IJ approach.  If filling pressures are reasonable then we will proceed with high risk PCI.  If the patient is not optimized we will have advanced heart failure optimize the patient prior to high risk PCI.  I have reviewed the risks, indications, and  alternatives to cardiac catheterization, possible angioplasty, and stenting with the patient. Risks include but are not limited to bleeding, infection, vascular injury, stroke, myocardial infection, arrhythmia, kidney injury, radiation-related injury in the case of prolonged fluoroscopy use, emergency cardiac surgery, and death. The patient understands the risks of serious complications is 5-10% given his comorbid conditions and complexity of the procedure.

## 2023-10-16 NOTE — Interval H&P Note (Signed)
 History and Physical Interval Note:  10/16/2023 8:06 PM  James Chan  has presented today for surgery, with the diagnosis of cad.  The various methods of treatment have been discussed with the patient and family. After consideration of risks, benefits and other options for treatment, the patient has consented to  Procedure(s): RIGHT HEART CATH (N/A) CORONARY STENT INTERVENTION (N/A) as a surgical intervention.  The patient's history has been reviewed, patient examined, no change in status, stable for surgery.  I have reviewed the patient's chart and labs.  Questions were answered to the patient's satisfaction.     [name]
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
## 2023-10-16 NOTE — Progress Notes (Signed)
 Heart Failure Navigator Progress Note  Assessed for Heart & Vascular TOC clinic readiness.  Patient does not meet criteria due to ESRD on hemodialysis. No HF TOC.   Navigator will sign off at this time.   Randie Bustle, BSN, Scientist, clinical (histocompatibility and immunogenetics) Only

## 2023-10-16 NOTE — Plan of Care (Signed)

## 2023-10-16 NOTE — Progress Notes (Signed)
  [facility] Progress Note   Subjective: Seen in [facility]. UF goal 2.5L Uneventful night. Denies cp/sob.   Objective Vitals:   10/16/23 0830 10/16/23 0900 10/16/23 0930 10/16/23 1000  BP: 138/70 (!) 148/52 (!) 141/56 (!) 143/69  Pulse: 60 62 64 61  Resp: 17 19 19  (!) 21  Temp:      TempSrc:      SpO2: 96% 100% 96% 100%  Weight:      Height:         Additional Objective Labs: Basic Metabolic Panel: Recent Labs  Lab 10/14/23 0557 10/15/23 0549 10/15/23 0938 10/16/23 0404  NA 131* 129* 127*  128* 126*  K 4.5 5.1 5.5*  5.5* 5.2*  CL 96* 93*  --  93*  CO2 26 26  --  25  GLUCOSE 107* 106*  --  102*  BUN 28* 44*  --  58*  CREATININE 4.05* 4.64*  --  5.27*  CALCIUM  8.9 9.0  --  8.7*   CBC: Recent Labs  Lab 10/14/23 0557 10/15/23 0549 10/15/23 0938 10/16/23 0404  WBC 8.0 7.9  --  8.5  NEUTROABS 6.1  --   --   --   HGB 10.7* 10.9* 10.9*  10.5* 10.4*  HCT 32.4* 32.6* 32.0*  31.0* 31.4*  MCV 100.0 99.1  --  99.4  PLT 170 152  --  146*   Blood Culture No results found for: SDES, SPECREQUEST, CULT, REPTSTATUS   Physical Exam General: Lying bed, nad Heart: RRR Lungs: Clear Abdomen: soft, non tender Extremities: no LE edema Dialysis Access: R AVF +T/B  Medications:  sodium chloride      anticoagulant sodium citrate     heparin  1,400 Units/hr (10/16/23 0828)    allopurinol   100 mg Oral q1800   amiodarone   200 mg Oral Daily   aspirin  EC  81 mg Oral Daily   atorvastatin   40 mg Oral Daily   carvedilol  3.125 mg Oral BID WC   latanoprost   1 drop Both Eyes QHS   mexiletine  250 mg Oral BID   midodrine   10 mg Oral Q M,W,F-HD   midodrine   5 mg Oral Once per day on Sunday Tuesday Thursday Saturday   pyridoxine  100 mg Oral Q1500   sodium chloride  flush  3 mL Intravenous Q12H   torsemide   100 mg Oral Once per day on Sunday Tuesday Thursday Saturday   traZODone  50 mg Oral QHS    Dialysis Orders:  MWF - [facility] 3:30hr, 400/A1.5, EDW 67.3kg,  2K/2.5Ca, UFP #2, AVF, no heparin  - No ESA - Calcitriol  0.39mcg PO q HD - Sensipar 30mg  PO q HD   Assessment/Plan: CAD/Unstable angina. Cardiology following, s/p LHC 6/23 with severe multivessel disease. CTS consulted. Plans per cardiology.  Hx VT/VF.  Unstable recently. Per cards note, pursuing mexilitine this admit. ESRD:  HD MWF. Off schedule today. Back on schedule Wed.  No added heparin  with HD (on drip). Hypotension/volume: BP ok, on low dose Coreg and uses midodrine  pre-HD. No LE edema, some vol on CXR.  Anemia: Hgb 10.9 - follow without ESA.  Metabolic bone disease: Ca ok, continue home meds.  T2DM  HFrEF, severe CAD  COPD  Hx CVA  Tige Meas Ronnald Acosta PA-C [facility] 10/16/2023,10:34 AM

## 2023-10-16 NOTE — Progress Notes (Signed)
 Rounding Note   Patient Name: James Chan Date of Encounter: 10/16/2023  [facility] Cardiologist: Redell Leiter, MD   Subjective Patient currently in hemodialysis.  Denies chest pain or shortness of breath.  Scheduled Meds:  allopurinol   100 mg Oral q1800   amiodarone   200 mg Oral Daily   aspirin  EC  81 mg Oral Daily   atorvastatin   40 mg Oral Daily   carvedilol  3.125 mg Oral BID WC   latanoprost   1 drop Both Eyes QHS   mexiletine  250 mg Oral BID   midodrine   10 mg Oral Q M,W,F-HD   midodrine   5 mg Oral Once per day on Sunday Tuesday Thursday Saturday   pyridoxine  100 mg Oral Q1500   sodium chloride  flush  3 mL Intravenous Q12H   torsemide   100 mg Oral Once per day on Sunday Tuesday Thursday Saturday   traZODone  50 mg Oral QHS   Continuous Infusions:  sodium chloride      anticoagulant sodium citrate     heparin  1,400 Units/hr (10/16/23 0828)   PRN Meds: sodium chloride , acetaminophen , albuterol , anticoagulant sodium citrate, heparin , lidocaine  (PF), lidocaine -prilocaine , nitroGLYCERIN , ondansetron  (ZOFRAN ) IV, oxyCODONE -acetaminophen , pentafluoroprop-tetrafluoroeth, sodium chloride  flush   Vital Signs  Vitals:   10/16/23 1030 10/16/23 1100 10/16/23 1130 10/16/23 1147  BP: (!) 145/118 137/78 (!) 143/54 (!) 147/55  Pulse: 60 62 (!) 59 62  Resp: 17 16 12 12   Temp:      TempSrc:      SpO2: 95% 100% 98% 100%  Weight:      Height:        Intake/Output Summary (Last 24 hours) at 10/16/2023 1151 Last data filed at 10/16/2023 9171 Gross per 24 hour  Intake 723.64 ml  Output --  Net 723.64 ml      10/16/2023    7:56 AM 10/15/2023    5:05 AM 10/14/2023    2:14 PM  Last 3 Weights  Weight (lbs) 160 lb 0.9 oz 155 lb 6.4 oz 154 lb 3.2 oz  Weight (kg) 72.6 kg 70.489 kg 69.945 kg      Telemetry Sinus rhythm- Personally Reviewed  ECG  Not performed today- Personally Reviewed  Physical Exam  GEN: No acute distress.   Neck: No JVD Cardiac: RRR, no  murmurs, rubs, or gallops.  Respiratory: Clear to auscultation bilaterally. GI: Soft, nontender, non-distended  MS: No edema; No deformity. Neuro:  Nonfocal  Psych: Normal affect   Labs High Sensitivity Troponin:   Recent Labs  Lab 10/14/23 0557 10/14/23 0805  TROPONINIHS 32* 35*     Chemistry Recent Labs  Lab 10/14/23 0557 10/15/23 0549 10/15/23 0938 10/16/23 0404  NA 131* 129* 127*  128* 126*  K 4.5 5.1 5.5*  5.5* 5.2*  CL 96* 93*  --  93*  CO2 26 26  --  25  GLUCOSE 107* 106*  --  102*  BUN 28* 44*  --  58*  CREATININE 4.05* 4.64*  --  5.27*  CALCIUM  8.9 9.0  --  8.7*  GFRNONAA 15* 13*  --  11*  ANIONGAP 9 10  --  8    Lipids No results for input(s): CHOL, TRIG, HDL, LABVLDL, LDLCALC, CHOLHDL in the last 168 hours.  Hematology Recent Labs  Lab 10/14/23 0557 10/15/23 0549 10/15/23 0938 10/16/23 0404  WBC 8.0 7.9  --  8.5  RBC 3.24* 3.29*  --  3.16*  HGB 10.7* 10.9* 10.9*  10.5* 10.4*  HCT 32.4* 32.6*  32.0*  31.0* 31.4*  MCV 100.0 99.1  --  99.4  MCH 33.0 33.1  --  32.9  MCHC 33.0 33.4  --  33.1  RDW 13.7 13.7  --  13.6  PLT 170 152  --  146*   Thyroid  No results for input(s): TSH, FREET4 in the last 168 hours.  BNPNo results for input(s): BNP, PROBNP in the last 168 hours.  DDimer No results for input(s): DDIMER in the last 168 hours.   Radiology  ECHOCARDIOGRAM COMPLETE Result Date: 10/16/2023    ECHOCARDIOGRAM REPORT   Patient Name:   James Chan Date of Exam: 10/16/2023 Medical Rec #:  [id number]        Height:       66.0 in Accession #:    [id number]       Weight:       160.1 lb Date of Birth:  01-23-1951       BSA:          1.819 m Patient Age:    72 years         BP:           142/62 mmHg Patient Gender: M                HR:           61 bpm. Exam Location:  Inpatient Procedure: 2D Echo, Cardiac Doppler and Color Doppler (Both Spectral and Color            Flow Doppler were utilized during procedure). Indications:    Acute  Ischemic heart disease  History:        Patient has prior history of Echocardiogram examinations, most                 recent 07/13/2023. CAD.  Sonographer:    Benard Stallion Referring Phys: [phone] CHRISTOPHER END IMPRESSIONS  1. Left ventricular ejection fraction, by estimation, is <20%. The left ventricle has severely decreased function. The left ventricle demonstrates global hypokinesis. The left ventricular internal cavity size was moderately dilated. There is mild concentric left ventricular hypertrophy. Left ventricular diastolic parameters are consistent with Grade II diastolic dysfunction (pseudonormalization).  2. Right ventricular systolic function is mildly reduced. The right ventricular size is mildly enlarged. There is severely elevated pulmonary artery systolic pressure.  3. Left atrial size was severely dilated.  4. Right atrial size was severely dilated.  5. The mitral valve is abnormal. Trivial mitral valve regurgitation. No evidence of mitral stenosis.  6. Eccentric jet.  7. The aortic valve is tricuspid. There is moderate calcification of the aortic valve. Aortic valve regurgitation is trivial. Aortic valve sclerosis is present, with no evidence of aortic valve stenosis. Comparison(s): Prior images reviewed side by side. Pericardial effusion has decreased. FINDINGS  Left Ventricle: Left ventricular ejection fraction, by estimation, is <20%. The left ventricle has severely decreased function. The left ventricle demonstrates global hypokinesis. The left ventricular internal cavity size was moderately dilated. There is mild concentric left ventricular hypertrophy. Left ventricular diastolic parameters are consistent with Grade II diastolic dysfunction (pseudonormalization). Right Ventricle: The right ventricular size is mildly enlarged. No increase in right ventricular wall thickness. Right ventricular systolic function is mildly reduced. There is severely elevated pulmonary artery systolic pressure.  The tricuspid regurgitant velocity is 3.85 m/s, and with an assumed right atrial pressure of 8 mmHg, the estimated right ventricular systolic pressure is 67.3 mmHg. Left Atrium: Left atrial size was severely dilated. Right Atrium: Right  atrial size was severely dilated. Pericardium: Trivial pericardial effusion is present. The pericardial effusion is anterior to the right ventricle. Mitral Valve: The mitral valve is abnormal. Trivial mitral valve regurgitation. No evidence of mitral valve stenosis. Tricuspid Valve: Eccentric jet. The tricuspid valve is normal in structure. Tricuspid valve regurgitation is mild . No evidence of tricuspid stenosis. Aortic Valve: The aortic valve is tricuspid. There is moderate calcification of the aortic valve. Aortic valve regurgitation is trivial. Aortic valve sclerosis is present, with no evidence of aortic valve stenosis. Aortic valve mean gradient measures 3.0  mmHg. Aortic valve peak gradient measures 6.0 mmHg. Aortic valve area, by VTI measures 1.17 cm. Pulmonic Valve: The pulmonic valve was normal in structure. Pulmonic valve regurgitation is trivial. No evidence of pulmonic stenosis. Aorta: The aortic root and ascending aorta are structurally normal, with no evidence of dilitation. IAS/Shunts: The atrial septum is grossly normal.  LEFT VENTRICLE PLAX 2D LVIDd:         6.40 cm      Diastology LVIDs:         6.00 cm      LV e' medial:    2.28 cm/s LV PW:         1.10 cm      LV E/e' medial:  38.3 LV IVS:        1.10 cm      LV e' lateral:   5.11 cm/s LVOT diam:     2.20 cm      LV E/e' lateral: 17.1 LV SV:         38 LV SV Index:   21 LVOT Area:     3.80 cm  LV Volumes (MOD) LV vol d, MOD A2C: 280.0 ml LV vol d, MOD A4C: 225.0 ml LV vol s, MOD A2C: 237.0 ml LV vol s, MOD A4C: 183.0 ml LV SV MOD A2C:     43.0 ml LV SV MOD A4C:     225.0 ml LV SV MOD BP:      44.0 ml RIGHT VENTRICLE RV Basal diam:  4.20 cm RV Mid diam:    3.10 cm RV S prime:     7.51 cm/s TAPSE (M-mode): 1.5 cm  LEFT ATRIUM             Index        RIGHT ATRIUM           Index LA diam:        4.40 cm 2.42 cm/m   RA Area:     26.30 cm LA Vol (A2C):   86.7 ml 47.66 ml/m  RA Volume:   92.80 ml  51.01 ml/m LA Vol (A4C):   83.3 ml 45.79 ml/m LA Biplane Vol: 94.7 ml 52.06 ml/m  AORTIC VALVE AV Area (Vmax):    1.41 cm AV Area (Vmean):   1.31 cm AV Area (VTI):     1.17 cm AV Vmax:           122.00 cm/s AV Vmean:          85.500 cm/s AV VTI:            0.321 m AV Peak Grad:      6.0 mmHg AV Mean Grad:      3.0 mmHg LVOT Vmax:         45.30 cm/s LVOT Vmean:        29.400 cm/s LVOT VTI:          0.099 m LVOT/AV  VTI ratio: 0.31  AORTA Ao Root diam: 3.10 cm Ao Asc diam:  3.50 cm MITRAL VALVE               TRICUSPID VALVE MV Area (PHT): 4.15 cm    TR Peak grad:   59.3 mmHg MV Decel Time: 183 msec    TR Vmax:        385.00 cm/s MV E velocity: 87.30 cm/s MV A velocity: 56.80 cm/s  SHUNTS MV E/A ratio:  1.54        Systemic VTI:  0.10 m                            Systemic Diam: 2.20 cm Stanly Leavens MD Electronically signed by Stanly Leavens MD Signature Date/Time: 10/16/2023/11:21:59 AM    Final    CARDIAC CATHETERIZATION Result Date: 10/15/2023 Conclusions: Severe multivessel coronary artery disease, including 40% distal LMCA stenosis, 90% proximal LAD stenosis involving large D1 branch with heavy calcification, sequential 60% and 100% mid and distal LCx stenoses, and chronic total occlusion of ostial through distal RCA. Mildly-moderately elevated left heart filling pressures (LVEDP, PCWP 25 mmHg). Severely elevated right heart and pulmonary artery pressures (RA 16, PA 78/30, mean PAP 46 mmHg). Normal Fick cardiac output/index (CO 4.9 L/min, CI 2.7 L/min/m^2). Reduced thermodilution cardiac output/index (CO 3.8 L/min, CI 2.1 L/min/m^2). Calcified abdominal aorta with moderate luminal irregularities (distal aorta not well-visualized due to low cardiac output). Patent left common iliac stent with 30-40% in-stent  restenosis. Recommendations: Cardiac surgery consultation given significant multivessel disease and complex proximal LAD/D1 lesion.  If the patient is not a candidate for surgery, high-risk PCI to LAD/D1 versus palliative medical therapy will need to be considered. Resume heparin  8 hours after femoral hemostasis has been achieved. Proceed with hemodialysis today and optimize chronic HFrEF; consultation with advanced heart failure team may be helpful. Repeat echocardiogram to reassess LVEF and mitral regurgitation; prominent v-waves suggest significant mitral regurgitation. Aggressive secondary prevention of coronary artery disease. Lonni Hanson, MD [facility]  PERIPHERAL VASCULAR CATHETERIZATION Result Date: 10/15/2023 Conclusions: Severe multivessel coronary artery disease, including 40% distal LMCA stenosis, 90% proximal LAD stenosis involving large D1 branch with heavy calcification, sequential 60% and 100% mid and distal LCx stenoses, and chronic total occlusion of ostial through distal RCA. Mildly-moderately elevated left heart filling pressures (LVEDP, PCWP 25 mmHg). Severely elevated right heart and pulmonary artery pressures (RA 16, PA 78/30, mean PAP 46 mmHg). Normal Fick cardiac output/index (CO 4.9 L/min, CI 2.7 L/min/m^2). Reduced thermodilution cardiac output/index (CO 3.8 L/min, CI 2.1 L/min/m^2). Calcified abdominal aorta with moderate luminal irregularities (distal aorta not well-visualized due to low cardiac output). Patent left common iliac stent with 30-40% in-stent restenosis. Recommendations: Cardiac surgery consultation given significant multivessel disease and complex proximal LAD/D1 lesion.  If the patient is not a candidate for surgery, high-risk PCI to LAD/D1 versus palliative medical therapy will need to be considered. Resume heparin  8 hours after femoral hemostasis has been achieved. Proceed with hemodialysis today and optimize chronic HFrEF; consultation with advanced heart  failure team may be helpful. Repeat echocardiogram to reassess LVEF and mitral regurgitation; prominent v-waves suggest significant mitral regurgitation. Aggressive secondary prevention of coronary artery disease. Lonni Hanson, MD [facility]   Cardiac Studies  2D echocardiogram (10/12/2023)  IMPRESSIONS     1. Left ventricular ejection fraction, by estimation, is <20%. The left  ventricle has severely decreased function. The left ventricle demonstrates  global hypokinesis.  The left ventricular internal cavity size was  moderately dilated. There is mild  concentric left ventricular hypertrophy. Left ventricular diastolic  parameters are consistent with Grade II diastolic dysfunction  (pseudonormalization).   2. Right ventricular systolic function is mildly reduced. The right  ventricular size is mildly enlarged. There is severely elevated pulmonary  artery systolic pressure.   3. Left atrial size was severely dilated.   4. Right atrial size was severely dilated.   5. The mitral valve is abnormal. Trivial mitral valve regurgitation. No  evidence of mitral stenosis.   6. Eccentric jet.   7. The aortic valve is tricuspid. There is moderate calcification of the  aortic valve. Aortic valve regurgitation is trivial. Aortic valve  sclerosis is present, with no evidence of aortic valve stenosis.   Cardiac catheterization (10/15/2023)  Conclusions: Severe multivessel coronary artery disease, including 40% distal LMCA stenosis, 90% proximal LAD stenosis involving large D1 branch with heavy calcification, sequential 60% and 100% mid and distal LCx stenoses, and chronic total occlusion of ostial through distal RCA. Mildly-moderately elevated left heart filling pressures (LVEDP, PCWP 25 mmHg). Severely elevated right heart and pulmonary artery pressures (RA 16, PA 78/30, mean PAP 46 mmHg). Normal Fick cardiac output/index (CO 4.9 L/min, CI 2.7 L/min/m^2). Reduced thermodilution cardiac  output/index (CO 3.8 L/min, CI 2.1 L/min/m^2). Calcified abdominal aorta with moderate luminal irregularities (distal aorta not well-visualized due to low cardiac output). Patent left common iliac stent with 30-40% in-stent restenosis.   Recommendations: Cardiac surgery consultation given significant multivessel disease and complex proximal LAD/D1 lesion.  If the patient is not a candidate for surgery, high-risk PCI to LAD/D1 versus palliative medical therapy will need to be considered. Resume heparin  8 hours after femoral hemostasis has been achieved. Proceed with hemodialysis today and optimize chronic HFrEF; consultation with advanced heart failure team may be helpful. Repeat echocardiogram to reassess LVEF and mitral regurgitation; prominent v-waves suggest significant mitral regurgitation. Aggressive secondary prevention of coronary artery disease.   Lonni Hanson, MD [facility]  onary Diagrams  Diagnostic Dominance: Right  Intervention Patient Profile   73 y.o. male with supine hypertension, orthostatic hypotension, diabetes mellitus with stage V chronic kidney disease presently on hemodialysis via right brachial AV fistula, hypercholesterolemia, PAD with right SFA stenting, LAD stenting in 2023, CAD presenting during COVID with recurrent VT VF, cardiac catheterization on 01/15/2020 revealing occluded RCA and distal circumflex stents that were placed remotely, moderate disease in left main and proximal LAD with LAD to RCA collaterals SP subcutaneous [employer] ICD placement recently seen by Dr. Inocencio on 10/08/2023 with recurrent ICD discharge due to recurrent VT VF requiring 6 shocks on 10/05/2023 patient unaware.   Assessment & Plan  1: CAD-patient mated with unstable angina.  Enzymes were negative.  Right left heart cath performed by Dr. Hanson yesterday revealed three-vessel disease with an occluded dominant RCA with left-to-right collaterals, occluded circumflex in the  midportion which is nondominant and high-grade calcified proximal LAD diagonal branch bifurcation disease.  He was seen by Dr. Lucas for consideration of CABG and was turned down because he was not a good operative candidate.  His recommendation was high risk PCI versus medical therapy/palliative care.  He remains on IV heparin .  He has had no pain since he is been here.  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  Unfortunately, this poses  a difficult situation.  He does have left main equivalent with three-vessel disease and high-grade proximal LAD diagonal branch calcified bifurcation disease which is his only percutaneously addressable vessel.  He has severe LV dysfunction, pulmonary hypertension and PAD with bilateral iliac stents although they were demonstrated to be patent.  He is high risk PCI of his proximal LAD diagonal branch which most likely would require adjunctive therapy such as orbital atherectomy.  Ideally, he probably would require mechanical circulatory support but this may be problematic with his iliac stents.  I discussed the case with Dr.Thukkani who is agreed to review the data and decide whether or not to proceed with intervention.     For questions or updates, please contact [facility] Please consult [email or web address] for contact info under     Signed, Dorn Lesches, MD  10/16/2023, 11:51 AM

## 2023-10-17 ENCOUNTER — Inpatient Hospital Stay (HOSPITAL_COMMUNITY)

## 2023-10-17 ENCOUNTER — Encounter (HOSPITAL_COMMUNITY)
Admission: EM | Disposition: A | Discharge status: Home / Self Care | Payer: Self-pay | Source: Home / Self Care | Attending: Cardiology

## 2023-10-17 ENCOUNTER — Other Ambulatory Visit (HOSPITAL_COMMUNITY): Payer: Self-pay

## 2023-10-17 ENCOUNTER — Telehealth (HOSPITAL_COMMUNITY): Payer: Self-pay | Admitting: Pharmacy Technician

## 2023-10-17 DIAGNOSIS — I25110 Atherosclerotic heart disease of native coronary artery with unstable angina pectoris: Secondary | ICD-10-CM | POA: Diagnosis not present

## 2023-10-17 DIAGNOSIS — N186 End stage renal disease: Secondary | ICD-10-CM | POA: Diagnosis not present

## 2023-10-17 DIAGNOSIS — I2510 Atherosclerotic heart disease of native coronary artery without angina pectoris: Secondary | ICD-10-CM | POA: Diagnosis not present

## 2023-10-17 DIAGNOSIS — I70212 Atherosclerosis of native arteries of extremities with intermittent claudication, left leg: Secondary | ICD-10-CM

## 2023-10-17 DIAGNOSIS — I272 Pulmonary hypertension, unspecified: Secondary | ICD-10-CM | POA: Diagnosis not present

## 2023-10-17 HISTORY — PX: VENTRICULAR ASSIST DEVICE INSERTION: CATH118273

## 2023-10-17 HISTORY — PX: RIGHT HEART CATH: CATH118263

## 2023-10-17 HISTORY — PX: CORONARY ATHERECTOMY: CATH118238

## 2023-10-17 HISTORY — PX: CORONARY STENT INTERVENTION: CATH118234

## 2023-10-17 LAB — POCT I-STAT 7, (LYTES, BLD GAS, ICA,H+H)
Acid-Base Excess: 2 mmol/L (ref 0.0–2.0)
Acid-base deficit: 2 mmol/L (ref 0.0–2.0)
Bicarbonate: 27.7 mmol/L (ref 20.0–28.0)
Bicarbonate: 21.9 mmol/L (ref 20.0–28.0)
Calcium, Ion: 1.12 mmol/L — ABNORMAL LOW (ref 1.15–1.40)
Calcium, Ion: 1.12 mmol/L — ABNORMAL LOW (ref 1.15–1.40)
HCT: 31 % — ABNORMAL LOW (ref 39.0–52.0)
HCT: 26 % — ABNORMAL LOW (ref 39.0–52.0)
Hemoglobin: 10.5 g/dL — ABNORMAL LOW (ref 13.0–17.0)
Hemoglobin: 8.8 g/dL — ABNORMAL LOW (ref 13.0–17.0)
O2 Saturation: 66 %
O2 Saturation: 97 %
Patient temperature: 99.2
Potassium: 4.5 mmol/L (ref 3.5–5.1)
Potassium: 4.9 mmol/L (ref 3.5–5.1)
Sodium: 131 mmol/L — ABNORMAL LOW (ref 135–145)
Sodium: 127 mmol/L — ABNORMAL LOW (ref 135–145)
TCO2: 29 mmol/L (ref 22–32)
TCO2: 23 mmol/L (ref 22–32)
pCO2 arterial: 48.4 mmHg — ABNORMAL HIGH (ref 32–48)
pCO2 arterial: 32.8 mmHg (ref 32–48)
pH, Arterial: 7.366 (ref 7.35–7.45)
pH, Arterial: 7.433 (ref 7.35–7.45)
pO2, Arterial: 36 mmHg — CL (ref 83–108)
pO2, Arterial: 89 mmHg (ref 83–108)

## 2023-10-17 LAB — POCT I-STAT EG7
Acid-base deficit: 1 mmol/L (ref 0.0–2.0)
Bicarbonate: 25 mmol/L (ref 20.0–28.0)
Calcium, Ion: 1.1 mmol/L — ABNORMAL LOW (ref 1.15–1.40)
HCT: 31 % — ABNORMAL LOW (ref 39.0–52.0)
Hemoglobin: 10.5 g/dL — ABNORMAL LOW (ref 13.0–17.0)
O2 Saturation: 77 %
Potassium: 4.4 mmol/L (ref 3.5–5.1)
Sodium: 125 mmol/L — ABNORMAL LOW (ref 135–145)
TCO2: 26 mmol/L (ref 22–32)
pCO2, Ven: 46.3 mmHg (ref 44–60)
pH, Ven: 7.341 (ref 7.25–7.43)
pO2, Ven: 44 mmHg (ref 32–45)

## 2023-10-17 LAB — CBC
HCT: 32.9 % — ABNORMAL LOW (ref 39.0–52.0)
Hemoglobin: 11 g/dL — ABNORMAL LOW (ref 13.0–17.0)
MCH: 33.3 pg (ref 26.0–34.0)
MCHC: 33.4 g/dL (ref 30.0–36.0)
MCV: 99.7 fL (ref 80.0–100.0)
Platelets: 152 10*3/uL (ref 150–400)
RBC: 3.3 MIL/uL — ABNORMAL LOW (ref 4.22–5.81)
RDW: 13.8 % (ref 11.5–15.5)
WBC: 15.1 10*3/uL — ABNORMAL HIGH (ref 4.0–10.5)
nRBC: 0 % (ref 0.0–0.2)

## 2023-10-17 LAB — MRSA NEXT GEN BY PCR, NASAL: MRSA by PCR Next Gen: NOT DETECTED

## 2023-10-17 LAB — POCT ACTIVATED CLOTTING TIME
Activated Clotting Time: 279 s
Activated Clotting Time: 239 s
Activated Clotting Time: 273 s
Activated Clotting Time: 233 s
Activated Clotting Time: 239 s
Activated Clotting Time: 239 s
Activated Clotting Time: 199 s

## 2023-10-17 LAB — RENAL FUNCTION PANEL
Albumin: 3.2 g/dL — ABNORMAL LOW (ref 3.5–5.0)
Anion gap: 12 (ref 5–15)
BUN: 39 mg/dL — ABNORMAL HIGH (ref 8–23)
CO2: 22 mmol/L (ref 22–32)
Calcium: 8.7 mg/dL — ABNORMAL LOW (ref 8.9–10.3)
Chloride: 94 mmol/L — ABNORMAL LOW (ref 98–111)
Creatinine, Ser: 4 mg/dL — ABNORMAL HIGH (ref 0.61–1.24)
GFR, Estimated: 15 mL/min — ABNORMAL LOW (ref >60)
Glucose, Bld: 90 mg/dL (ref 70–99)
Phosphorus: 4.8 mg/dL — ABNORMAL HIGH (ref 2.5–4.6)
Potassium: 5.1 mmol/L (ref 3.5–5.1)
Sodium: 128 mmol/L — ABNORMAL LOW (ref 135–145)

## 2023-10-17 LAB — HEMOGLOBIN AND HEMATOCRIT, BLOOD
HCT: 26.8 % — ABNORMAL LOW (ref 39.0–52.0)
Hemoglobin: 9.1 g/dL — ABNORMAL LOW (ref 13.0–17.0)

## 2023-10-17 LAB — GLUCOSE, CAPILLARY: Glucose-Capillary: 118 mg/dL — ABNORMAL HIGH (ref 70–99)

## 2023-10-17 LAB — HEPARIN LEVEL (UNFRACTIONATED): Heparin Unfractionated: 0.32 [IU]/mL (ref 0.30–0.70)

## 2023-10-17 SURGERY — RIGHT HEART CATH
Anesthesia: LOCAL

## 2023-10-17 MED ORDER — ASPIRIN 81 MG PO CHEW: 81.0000 mg | CHEWABLE_TABLET | Freq: Every day | ORAL | Status: DC

## 2023-10-17 MED ORDER — TICAGRELOR 90 MG PO TABS
90.0000 mg | ORAL_TABLET | Freq: Two times a day (BID) | ORAL | Status: DC
  Administered 2023-10-18 – 2023-10-24 (×14): 90 mg via ORAL
  Filled 2023-10-17 (×14): qty 1

## 2023-10-17 MED ORDER — HEPARIN SODIUM (PORCINE) 1000 UNIT/ML IJ SOLN
INTRAMUSCULAR | Status: AC
  Filled 2023-10-17: qty 10

## 2023-10-17 MED ORDER — HEPARIN SODIUM (PORCINE) 1000 UNIT/ML IJ SOLN
INTRAMUSCULAR | Status: DC | PRN
  Administered 2023-10-17: 2000 [IU] via INTRAVENOUS
  Administered 2023-10-17: 3000 [IU] via INTRAVENOUS
  Administered 2023-10-17: 1000 [IU] via INTRAVENOUS
  Administered 2023-10-17: 10000 [IU] via INTRAVENOUS

## 2023-10-17 MED ORDER — LABETALOL HCL 5 MG/ML IV SOLN: 10.0000 mg | INTRAVENOUS | Status: AC | PRN

## 2023-10-17 MED ORDER — HEPARIN SODIUM (PORCINE) 1000 UNIT/ML IJ SOLN
INTRAMUSCULAR | Status: DC | PRN
  Administered 2023-10-17: 1000 [IU] via INTRAVENOUS

## 2023-10-17 MED ORDER — TICAGRELOR 90 MG PO TABS
180.0000 mg | ORAL_TABLET | Freq: Once | ORAL | Status: AC
  Administered 2023-10-17: 180 mg via ORAL
  Filled 2023-10-17: qty 2

## 2023-10-17 MED ORDER — VIPERSLIDE LUBRICANT OPTIME
TOPICAL | Status: DC | PRN
  Administered 2023-10-17: 500 mL via SURGICAL_CAVITY

## 2023-10-17 MED ORDER — CHLORHEXIDINE GLUCONATE CLOTH 2 % EX PADS
6.0000 | MEDICATED_PAD | Freq: Every day | CUTANEOUS | Status: DC
  Administered 2023-10-17 – 2023-10-20 (×4): 6 via TOPICAL

## 2023-10-17 MED ORDER — HEPARIN (PORCINE) IN NACL 1000-0.9 UT/500ML-% IV SOLN
INTRAVENOUS | Status: DC | PRN
  Administered 2023-10-17 (×2): 500 mL

## 2023-10-17 MED ORDER — HEPARIN SODIUM (PORCINE) 5000 UNIT/ML IJ SOLN
5000.0000 [IU] | Freq: Three times a day (TID) | INTRAMUSCULAR | Status: DC
  Administered 2023-10-18 – 2023-10-23 (×17): 5000 [IU] via SUBCUTANEOUS
  Filled 2023-10-17 (×18): qty 1

## 2023-10-17 MED ORDER — HYDRALAZINE HCL 20 MG/ML IJ SOLN
10.0000 mg | INTRAMUSCULAR | Status: DC | PRN
  Administered 2023-10-17: 10 mg via INTRAVENOUS
  Filled 2023-10-17: qty 1

## 2023-10-17 MED ORDER — TORSEMIDE 20 MG PO TABS
100.0000 mg | ORAL_TABLET | Freq: Once | ORAL | Status: AC
  Administered 2023-10-17: 100 mg via ORAL
  Filled 2023-10-17: qty 5

## 2023-10-17 MED ORDER — FENTANYL CITRATE (PF) 100 MCG/2ML IJ SOLN
INTRAMUSCULAR | Status: AC
  Filled 2023-10-17: qty 2

## 2023-10-17 MED ORDER — FENTANYL CITRATE (PF) 100 MCG/2ML IJ SOLN
INTRAMUSCULAR | Status: DC | PRN
  Administered 2023-10-17 (×6): 25 ug via INTRAVENOUS

## 2023-10-17 MED ORDER — SODIUM CHLORIDE 0.9 % IV SOLN: 250.0000 mL | INTRAVENOUS | Status: AC | PRN

## 2023-10-17 MED ORDER — LIDOCAINE HCL (PF) 1 % IJ SOLN
INTRAMUSCULAR | Status: AC
  Filled 2023-10-17: qty 30

## 2023-10-17 MED ORDER — HYDRALAZINE HCL 20 MG/ML IJ SOLN
10.0000 mg | Freq: Four times a day (QID) | INTRAMUSCULAR | Status: DC | PRN
  Filled 2023-10-17: qty 1

## 2023-10-17 MED ORDER — OXIDIZED CELLULOSE EX PADS
1.0000 | MEDICATED_PAD | Freq: Once | CUTANEOUS | Status: AC
  Administered 2023-10-17: 1 via TOPICAL
  Filled 2023-10-17: qty 1

## 2023-10-17 MED ORDER — SODIUM CHLORIDE 0.9% FLUSH
3.0000 mL | Freq: Two times a day (BID) | INTRAVENOUS | Status: DC
  Administered 2023-10-17 – 2023-10-21 (×8): 3 mL via INTRAVENOUS

## 2023-10-17 MED ORDER — LIDOCAINE HCL (PF) 1 % IJ SOLN
INTRAMUSCULAR | Status: DC | PRN
  Administered 2023-10-17 (×2): 5 mL

## 2023-10-17 MED ORDER — MIDAZOLAM HCL 2 MG/2ML IJ SOLN
INTRAMUSCULAR | Status: DC | PRN
  Administered 2023-10-17 (×6): 1 mg via INTRAVENOUS

## 2023-10-17 MED ORDER — MIDAZOLAM HCL 2 MG/2ML IJ SOLN
INTRAMUSCULAR | Status: AC
  Filled 2023-10-17: qty 2

## 2023-10-17 MED ORDER — MIDAZOLAM HCL 2 MG/2ML IJ SOLN
INTRAMUSCULAR | Status: AC
Start: 2023-10-17 — End: 2023-10-17
  Filled 2023-10-17: qty 2

## 2023-10-17 MED ORDER — SODIUM CHLORIDE 0.9% FLUSH: 3.0000 mL | INTRAVENOUS | Status: DC | PRN

## 2023-10-17 MED ORDER — TICAGRELOR 90 MG PO TABS: 90.0000 mg | ORAL_TABLET | Freq: Two times a day (BID) | ORAL | Status: DC

## 2023-10-17 MED ORDER — IOHEXOL 350 MG/ML SOLN
100.0000 mL | Freq: Once | INTRAVENOUS | Status: AC | PRN
  Administered 2023-10-17: 100 mL via INTRAVENOUS

## 2023-10-17 SURGICAL SUPPLY — 46 items
BALLOON ATHLETIS 8X20X75 (BALLOONS) IMPLANT
BALLOON MUSTANG 7.0X20 75 (BALLOONS) IMPLANT
BALLOON MUSTANG 7.0X40 75 (BALLOONS) IMPLANT
BALLOON MUSTANG 9X20X75 (BALLOONS) IMPLANT
BALLOON SAPPHIRE 1.0X8 (BALLOONS) IMPLANT
BALLOON SAPPHIRE 2.0X12 (BALLOONS) IMPLANT
BALLOON SAPPHIRE NC24 3.5X12 (BALLOONS) IMPLANT
BALLOON WOLVERINE 3.00X15 (BALLOONS) IMPLANT
BALLOON ~~LOC~~ EMERGE MR 3.0X20 (BALLOONS) IMPLANT
BALLOON ~~LOC~~ EMERGE MR 3.5X12 (BALLOONS) IMPLANT
CATH 5FR JL3.5 JR4 ANG PIG MP (CATHETERS) IMPLANT
CATH DIAG 6FR PIGTAIL ANGLED (CATHETERS) IMPLANT
CATH EXPO 5F MPA-1 (CATHETERS) IMPLANT
CATH SHOCKWAVE L6 IVL 8.0X30 (CATHETERS) IMPLANT
CATH SWAN GANZ 7F STRAIGHT (CATHETERS) IMPLANT
CATH TELEPORT (CATHETERS) IMPLANT
CATH VISTA GUIDE 7FR XB 3.5 (CATHETERS) IMPLANT
CATH VISTA GUIDE 7FR XB3.5 MPK (CATHETERS) IMPLANT
CROWN DIAMONDBACK CLASSIC 1.25 (BURR) IMPLANT
DEVICE IMPELLA CP SMRT ASSIST (CATHETERS) IMPLANT
ELECT DEFIB PAD ADLT CADENCE (PAD) IMPLANT
GUIDEWIRE ANGLED .035X150CM (WIRE) IMPLANT
GUIDEWIRE SAF TJ AMPL .035X180 (WIRE) IMPLANT
GUIDEWIRE SAFE TJ AMPLATZ EXST (WIRE) IMPLANT
GUIDEWIRE TIGER .035X300 (WIRE) IMPLANT
GUIDEWIRE VAS SION BLUE 190 (WIRE) IMPLANT
KIT ENCORE 26 ADVANTAGE (KITS) IMPLANT
KIT HEMO VALVE WATCHDOG (MISCELLANEOUS) IMPLANT
LUBRICANT VIPERSLIDE CORONARY (MISCELLANEOUS) IMPLANT
PACK CARDIAC CATHETERIZATION (CUSTOM PROCEDURE TRAY) ×1 IMPLANT
SET ATX-X65L (MISCELLANEOUS) IMPLANT
SHEATH FAST CATH 14F (SHEATH) IMPLANT
SHEATH PINNACLE 6F 10CM (SHEATH) IMPLANT
SHEATH PINNACLE 7F 10CM (SHEATH) IMPLANT
SHEATH PROBE COVER 6X72 (BAG) IMPLANT
STENT ONYX FRONTIER 3.0X22 (Permanent Stent) IMPLANT
SYR 8ML ANGIOGRAPH HI-PRES (SYRINGE) IMPLANT
TRANSDUCER W/STOPCOCK (MISCELLANEOUS) IMPLANT
TUBING ART PRESS 72 MALE/FEM (TUBING) IMPLANT
TUBING CIL FLEX 10 FLL-RA (TUBING) IMPLANT
WIRE CHOICE GRAPHX PT 300 (WIRE) IMPLANT
WIRE EMERALD 3MM-J .035X260CM (WIRE) IMPLANT
WIRE MICRO SET SILHO 5FR 7 (SHEATH) IMPLANT
WIRE MICROINTRODUCER 60CM (WIRE) IMPLANT
WIRE SION BLUE 300 (WIRE) IMPLANT
WIRE VIPERWIRE COR FLEX .012 (WIRE) IMPLANT

## 2023-10-17 NOTE — Progress Notes (Addendum)
 Patient is s/p cardiac cath, now with left groin hematoma, drop in hemoglobin.   difficult access with prior PAD and iliac stents. Rule out RP bleed, arterial injury  Ordered CT Chest/abd/pelvis with contrast- Benefits outweighs any risk.  He has a sizeable hematoma on the left groin. Sheath is still in place. ACT 190. Unable to put Impella due to calcified aorta and prior iliac stents.   - his CT chest/abd/pelvis showed focal dissection flap infrarenal abdominal aorta-just below the level of renal arteries. Left groin hematoma  Spoke to Vascular sx -Dr Serene; who was in the case earlier when interventionalist couldn't get Impella in.   Plan: -> BP control -> assess for any abd pain or limb ischemia -> Hold pressure s/p removal of sheath -> Doppler legs.

## 2023-10-17 NOTE — Progress Notes (Signed)
 Pt called out to nurse's desk c/o trouble breathing. O2 sat 95% on RA. 2L O2 placed for comfort. O2 sat 97%. Pt reports relief when sitting up on edge of bed from lying down. Jon Hails PA paged. See new orders.

## 2023-10-17 NOTE — Telephone Encounter (Signed)
 Patient Product/process development scientist completed.    The patient is insured through [facility]. Patient has Medicare and is not eligible for a copay card, but may be able to apply for patient assistance or Medicare RX Payment Plan (Patient Must reach out to their plan, if eligible for payment plan), if available.    Ran test claim for Brilinta 90 mg and the current 30 day co-pay is $4.90.  Ran test claim for ticagrelor (Brilinta) 90 mg and Product Not Covered  This test claim was processed through [employer]- copay amounts may vary at other pharmacies due to [employer], or as the patient moves through the different stages of their insurance plan.     Reyes Sharps, CPHT Pharmacy Technician III Certified Patient Advocate [facility] Patient Advocate Team Direct Number: [phone]  Fax: [phone]

## 2023-10-17 NOTE — Progress Notes (Signed)
 [facility] Progress Note   Subjective: Seen in room. No events overnight. For cardiac cath with PCI today.   Objective Vitals:   10/17/23 0420 10/17/23 0500 10/17/23 0725 10/17/23 1107  BP: (!) 126/46  (!) 113/51 (!) 120/49  Pulse: 68   (!) 55  Resp: 18  18 18   Temp: (!) 97.5 F (36.4 C)  99.1 F (37.3 C) (!) 97.5 F (36.4 C)  TempSrc: Oral  Oral Oral  SpO2: 98%  97% 100%  Weight:  69.8 kg    Height:         Additional Objective Labs: Basic Metabolic Panel: Recent Labs  Lab 10/15/23 0549 10/15/23 0938 10/16/23 0404 10/17/23 0418  NA 129* 127*  128* 126* 128*  K 5.1 5.5*  5.5* 5.2* 5.1  CL 93*  --  93* 94*  CO2 26  --  25 22  GLUCOSE 106*  --  102* 90  BUN 44*  --  58* 39*  CREATININE 4.64*  --  5.27* 4.00*  CALCIUM  9.0  --  8.7* 8.7*  PHOS  --   --   --  4.8*   CBC: Recent Labs  Lab 10/14/23 0557 10/15/23 0549 10/15/23 0938 10/16/23 0404 10/17/23 0418  WBC 8.0 7.9  --  8.5 15.1*  NEUTROABS 6.1  --   --   --   --   HGB 10.7* 10.9* 10.9*  10.5* 10.4* 11.0*  HCT 32.4* 32.6* 32.0*  31.0* 31.4* 32.9*  MCV 100.0 99.1  --  99.4 99.7  PLT 170 152  --  146* 152   Blood Culture No results found for: SDES, SPECREQUEST, CULT, REPTSTATUS   Physical Exam General: Lying bed, nad Heart: RRR Lungs: Clear Abdomen: soft, non tender Extremities: no LE edema Dialysis Access: R AVF +T/B  Medications:  sodium chloride  10 mL/hr at 10/17/23 0631   heparin  1,500 Units/hr (10/17/23 0832)    allopurinol   100 mg Oral q1800   amiodarone   200 mg Oral Daily   aspirin  EC  81 mg Oral Daily   atorvastatin   40 mg Oral Daily   carvedilol  3.125 mg Oral BID WC   Chlorhexidine  Gluconate Cloth  6 each Topical Q0600   latanoprost   1 drop Both Eyes QHS   mexiletine  250 mg Oral BID   midodrine   10 mg Oral Q M,W,F-HD   midodrine   5 mg Oral Once per day on Sunday Tuesday Thursday Saturday   pyridoxine  100 mg Oral Q1500   sodium chloride  flush  3 mL  Intravenous Q12H   ticagrelor  90 mg Oral BID   torsemide   100 mg Oral Once per day on Sunday Tuesday Thursday Saturday   traZODone  50 mg Oral QHS    Dialysis Orders:  MWF - [facility] 3:30hr, 400/A1.5, EDW 67.3kg, 2K/2.5Ca, UFP #2, AVF, no heparin  - No ESA - Calcitriol  0.75mcg PO q HD - Sensipar 30mg  PO q HD   Assessment/Plan: CAD/Unstable angina. Cardiology following, s/p LHC 6/23 with severe multivessel disease. CTS consulted. Plans per cardiology.  Hx VT/VF.  Unstable recently. Per cards note, pursuing mexilitine this admit. ESRD:  HD MWF. Off schedule this week.  Next HD Thurs. No added heparin  with HD (on drip). Hypotension/volume: BP ok, on low dose Coreg and uses midodrine  pre-HD. No LE edema, some vol on CXR.  Anemia: Hgb 10.9 - follow without ESA.  Metabolic bone disease: Ca ok, continue home meds.  T2DM  HFrEF, severe CAD  COPD  Hx CVA  Maisie Ronnald Acosta PA-C [facility] 10/17/2023,12:24 PM

## 2023-10-17 NOTE — Plan of Care (Signed)
  Problem: Activity: Goal: Risk for activity intolerance will decrease Outcome: Progressing   Problem: Pain Managment: Goal: General experience of comfort will improve and/or be controlled Outcome: Progressing   Problem: Safety: Goal: Ability to remain free from injury will improve Outcome: Progressing   Problem: Skin Integrity: Goal: Risk for impaired skin integrity will decrease Outcome: Progressing

## 2023-10-17 NOTE — Progress Notes (Signed)
 Cardiology MD paged to notify him of new developed hematoma at left femoral sheath site and near absent pedal pulses in lower left extremity. ACT surrently running. Ordered to get a STAT hgb as well. MD to bedside shortly

## 2023-10-17 NOTE — Progress Notes (Signed)
 eLink Physician-Brief Progress Note Patient Name: HARUTO DEMARIA DOB: 21-Apr-1951 MRN: [id number]   Date of Service  10/17/2023  HPI/Events of Note  Patient admitted to the ICU for close monitoring following right heart catheterization / Norva Purl catheterization /  PCI + Stent.  eICU Interventions  New Patient Evaluation.        Shamyia Grandpre U Loria Lacina 10/17/2023, 9:27 PM
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
## 2023-10-17 NOTE — Progress Notes (Addendum)
 Rounding Note   Patient Name: James Chan Date of Encounter: 10/17/2023  [facility] Cardiologist: Redell Leiter, MD   Subjective  No chest pain, awaiting cath today - scheduled for 3pm  Scheduled Meds:  allopurinol   100 mg Oral q1800   amiodarone   200 mg Oral Daily   aspirin  EC  81 mg Oral Daily   atorvastatin   40 mg Oral Daily   carvedilol  3.125 mg Oral BID WC   Chlorhexidine  Gluconate Cloth  6 each Topical Q0600   latanoprost   1 drop Both Eyes QHS   mexiletine  250 mg Oral BID   midodrine   10 mg Oral Q M,W,F-HD   midodrine   5 mg Oral Once per day on Sunday Tuesday Thursday Saturday   pyridoxine  100 mg Oral Q1500   sodium chloride  flush  3 mL Intravenous Q12H   ticagrelor  90 mg Oral BID   torsemide   100 mg Oral Once per day on Sunday Tuesday Thursday Saturday   traZODone  50 mg Oral QHS   Continuous Infusions:  sodium chloride  10 mL/hr at 10/17/23 0631   heparin  1,500 Units/hr (10/17/23 0832)   PRN Meds: acetaminophen , albuterol , nitroGLYCERIN , ondansetron  (ZOFRAN ) IV, oxyCODONE -acetaminophen , sodium chloride  flush   Vital Signs  Vitals:   10/16/23 2315 10/17/23 0420 10/17/23 0500 10/17/23 0725  BP: (!) 115/57 (!) 126/46  (!) 113/51  Pulse: (!) 57 68    Resp: 19 18  18   Temp: 97.6 F (36.4 C) (!) 97.5 F (36.4 C)  99.1 F (37.3 C)  TempSrc: Oral Oral  Oral  SpO2: 96% 98%  97%  Weight:   69.8 kg   Height:        Intake/Output Summary (Last 24 hours) at 10/17/2023 1033 Last data filed at 10/17/2023 1030 Gross per 24 hour  Intake 3 ml  Output 2500 ml  Net -2497 ml      10/17/2023    5:00 AM 10/16/2023   11:47 AM 10/16/2023    7:56 AM  Last 3 Weights  Weight (lbs) 153 lb 12.8 oz 159 lb 2.8 oz 160 lb 0.9 oz  Weight (kg) 69.763 kg 72.2 kg 72.6 kg      Telemetry SR with HR 70s, PVCs - Personally Reviewed  ECG  No new tracings - Personally Reviewed  Physical Exam  GEN: No acute distress.   Neck: No JVD Cardiac: RRR, 3/6 systolic  murmur   Respiratory: Clear to auscultation bilaterally. GI: Soft, nontender, non-distended  MS: No edema; No deformity. Neuro:  Nonfocal  Psych: Normal affect  LUE fistula  Labs High Sensitivity Troponin:   Recent Labs  Lab 10/14/23 0557 10/14/23 0805  TROPONINIHS 32* 35*     Chemistry Recent Labs  Lab 10/15/23 0549 10/15/23 0938 10/16/23 0404 10/17/23 0418  NA 129* 127*  128* 126* 128*  K 5.1 5.5*  5.5* 5.2* 5.1  CL 93*  --  93* 94*  CO2 26  --  25 22  GLUCOSE 106*  --  102* 90  BUN 44*  --  58* 39*  CREATININE 4.64*  --  5.27* 4.00*  CALCIUM  9.0  --  8.7* 8.7*  ALBUMIN  --   --   --  3.2*  GFRNONAA 13*  --  11* 15*  ANIONGAP 10  --  8 12    Lipids No results for input(s): CHOL, TRIG, HDL, LABVLDL, LDLCALC, CHOLHDL in the last 168 hours.  Hematology Recent Labs  Lab 10/15/23 828 113 2950 10/15/23 9061  10/16/23 0404 10/17/23 0418  WBC 7.9  --  8.5 15.1*  RBC 3.29*  --  3.16* 3.30*  HGB 10.9* 10.9*  10.5* 10.4* 11.0*  HCT 32.6* 32.0*  31.0* 31.4* 32.9*  MCV 99.1  --  99.4 99.7  MCH 33.1  --  32.9 33.3  MCHC 33.4  --  33.1 33.4  RDW 13.7  --  13.6 13.8  PLT 152  --  146* 152   Thyroid  No results for input(s): TSH, FREET4 in the last 168 hours.  BNPNo results for input(s): BNP, PROBNP in the last 168 hours.  DDimer No results for input(s): DDIMER in the last 168 hours.   Radiology  ECHOCARDIOGRAM COMPLETE Result Date: 10/16/2023    ECHOCARDIOGRAM REPORT   Patient Name:   James Chan Date of Exam: 10/16/2023 Medical Rec #:  [id number]        Height:       66.0 in Accession #:    [id number]       Weight:       160.1 lb Date of Birth:  1950/07/13       BSA:          1.819 m Patient Age:    72 years         BP:           142/62 mmHg Patient Gender: M                HR:           61 bpm. Exam Location:  Inpatient Procedure: 2D Echo, Cardiac Doppler and Color Doppler (Both Spectral and Color            Flow Doppler were utilized during  procedure). Indications:    Acute Ischemic heart disease  History:        Patient has prior history of Echocardiogram examinations, most                 recent 07/13/2023. CAD.  Sonographer:    Benard Stallion Referring Phys: [phone] CHRISTOPHER END IMPRESSIONS  1. Left ventricular ejection fraction, by estimation, is <20%. The left ventricle has severely decreased function. The left ventricle demonstrates global hypokinesis. The left ventricular internal cavity size was moderately dilated. There is mild concentric left ventricular hypertrophy. Left ventricular diastolic parameters are consistent with Grade II diastolic dysfunction (pseudonormalization).  2. Right ventricular systolic function is mildly reduced. The right ventricular size is mildly enlarged. There is severely elevated pulmonary artery systolic pressure.  3. Left atrial size was severely dilated.  4. Right atrial size was severely dilated.  5. The mitral valve is abnormal. Trivial mitral valve regurgitation. No evidence of mitral stenosis.  6. Eccentric jet.  7. The aortic valve is tricuspid. There is moderate calcification of the aortic valve. Aortic valve regurgitation is trivial. Aortic valve sclerosis is present, with no evidence of aortic valve stenosis. Comparison(s): Prior images reviewed side by side. Pericardial effusion has decreased. FINDINGS  Left Ventricle: Left ventricular ejection fraction, by estimation, is <20%. The left ventricle has severely decreased function. The left ventricle demonstrates global hypokinesis. The left ventricular internal cavity size was moderately dilated. There is mild concentric left ventricular hypertrophy. Left ventricular diastolic parameters are consistent with Grade II diastolic dysfunction (pseudonormalization). Right Ventricle: The right ventricular size is mildly enlarged. No increase in right ventricular wall thickness. Right ventricular systolic function is mildly reduced. There is severely elevated  pulmonary artery systolic pressure. The tricuspid regurgitant velocity is  3.85 m/s, and with an assumed right atrial pressure of 8 mmHg, the estimated right ventricular systolic pressure is 67.3 mmHg. Left Atrium: Left atrial size was severely dilated. Right Atrium: Right atrial size was severely dilated. Pericardium: Trivial pericardial effusion is present. The pericardial effusion is anterior to the right ventricle. Mitral Valve: The mitral valve is abnormal. Trivial mitral valve regurgitation. No evidence of mitral valve stenosis. Tricuspid Valve: Eccentric jet. The tricuspid valve is normal in structure. Tricuspid valve regurgitation is mild . No evidence of tricuspid stenosis. Aortic Valve: The aortic valve is tricuspid. There is moderate calcification of the aortic valve. Aortic valve regurgitation is trivial. Aortic valve sclerosis is present, with no evidence of aortic valve stenosis. Aortic valve mean gradient measures 3.0  mmHg. Aortic valve peak gradient measures 6.0 mmHg. Aortic valve area, by VTI measures 1.17 cm. Pulmonic Valve: The pulmonic valve was normal in structure. Pulmonic valve regurgitation is trivial. No evidence of pulmonic stenosis. Aorta: The aortic root and ascending aorta are structurally normal, with no evidence of dilitation. IAS/Shunts: The atrial septum is grossly normal.  LEFT VENTRICLE PLAX 2D LVIDd:         6.40 cm      Diastology LVIDs:         6.00 cm      LV e' medial:    2.28 cm/s LV PW:         1.10 cm      LV E/e' medial:  38.3 LV IVS:        1.10 cm      LV e' lateral:   5.11 cm/s LVOT diam:     2.20 cm      LV E/e' lateral: 17.1 LV SV:         38 LV SV Index:   21 LVOT Area:     3.80 cm  LV Volumes (MOD) LV vol d, MOD A2C: 280.0 ml LV vol d, MOD A4C: 225.0 ml LV vol s, MOD A2C: 237.0 ml LV vol s, MOD A4C: 183.0 ml LV SV MOD A2C:     43.0 ml LV SV MOD A4C:     225.0 ml LV SV MOD BP:      44.0 ml RIGHT VENTRICLE RV Basal diam:  4.20 cm RV Mid diam:    3.10 cm RV S prime:      7.51 cm/s TAPSE (M-mode): 1.5 cm LEFT ATRIUM             Index        RIGHT ATRIUM           Index LA diam:        4.40 cm 2.42 cm/m   RA Area:     26.30 cm LA Vol (A2C):   86.7 ml 47.66 ml/m  RA Volume:   92.80 ml  51.01 ml/m LA Vol (A4C):   83.3 ml 45.79 ml/m LA Biplane Vol: 94.7 ml 52.06 ml/m  AORTIC VALVE AV Area (Vmax):    1.41 cm AV Area (Vmean):   1.31 cm AV Area (VTI):     1.17 cm AV Vmax:           122.00 cm/s AV Vmean:          85.500 cm/s AV VTI:            0.321 m AV Peak Grad:      6.0 mmHg AV Mean Grad:      3.0 mmHg LVOT Vmax:  45.30 cm/s LVOT Vmean:        29.400 cm/s LVOT VTI:          0.099 m LVOT/AV VTI ratio: 0.31  AORTA Ao Root diam: 3.10 cm Ao Asc diam:  3.50 cm MITRAL VALVE               TRICUSPID VALVE MV Area (PHT): 4.15 cm    TR Peak grad:   59.3 mmHg MV Decel Time: 183 msec    TR Vmax:        385.00 cm/s MV E velocity: 87.30 cm/s MV A velocity: 56.80 cm/s  SHUNTS MV E/A ratio:  1.54        Systemic VTI:  0.10 m                            Systemic Diam: 2.20 cm Stanly Leavens MD Electronically signed by Stanly Leavens MD Signature Date/Time: 10/16/2023/11:21:59 AM    Final     Cardiac Studies   1. Left ventricular ejection fraction, by estimation, is <20%. The left  ventricle has severely decreased function. The left ventricle demonstrates  global hypokinesis. The left ventricular internal cavity size was  moderately dilated. There is mild  concentric left ventricular hypertrophy. Left ventricular diastolic  parameters are consistent with Grade II diastolic dysfunction  (pseudonormalization).   2. Right ventricular systolic function is mildly reduced. The right  ventricular size is mildly enlarged. There is severely elevated pulmonary  artery systolic pressure.   3. Left atrial size was severely dilated.   4. Right atrial size was severely dilated.   5. The mitral valve is abnormal. Trivial mitral valve regurgitation. No  evidence of mitral  stenosis.   6. Eccentric jet.   7. The aortic valve is tricuspid. There is moderate calcification of the  aortic valve. Aortic valve regurgitation is trivial. Aortic valve  sclerosis is present, with no evidence of aortic valve stenosis.    Cardiac catheterization (10/15/2023)   Conclusions: Severe multivessel coronary artery disease, including 40% distal LMCA stenosis, 90% proximal LAD stenosis involving large D1 branch with heavy calcification, sequential 60% and 100% mid and distal LCx stenoses, and chronic total occlusion of ostial through distal RCA. Mildly-moderately elevated left heart filling pressures (LVEDP, PCWP 25 mmHg). Severely elevated right heart and pulmonary artery pressures (RA 16, PA 78/30, mean PAP 46 mmHg). Normal Fick cardiac output/index (CO 4.9 L/min, CI 2.7 L/min/m^2). Reduced thermodilution cardiac output/index (CO 3.8 L/min, CI 2.1 L/min/m^2). Calcified abdominal aorta with moderate luminal irregularities (distal aorta not well-visualized due to low cardiac output). Patent left common iliac stent with 30-40% in-stent restenosis.   Recommendations: Cardiac surgery consultation given significant multivessel disease and complex proximal LAD/D1 lesion.  If the patient is not a candidate for surgery, high-risk PCI to LAD/D1 versus palliative medical therapy will need to be considered. Resume heparin  8 hours after femoral hemostasis has been achieved. Proceed with hemodialysis today and optimize chronic HFrEF; consultation with advanced heart failure team may be helpful. Repeat echocardiogram to reassess LVEF and mitral regurgitation; prominent v-waves suggest significant mitral regurgitation. Aggressive secondary prevention of coronary artery disease.   Lonni Hanson, MD [facility]   onary Diagrams   Diagnostic Dominance: Right   Patient Profile   73 y.o. male with supine hypertension, orthostatic hypotension, diabetes mellitus with stage V chronic kidney  disease presently on hemodialysis via right brachial AV fistula, hypercholesterolemia, PAD with right SFA stenting, LAD stenting  in 2023, CAD presenting during COVID with recurrent VT VF, cardiac catheterization on 01/15/2020 revealing occluded RCA and distal circumflex stents that were placed remotely, moderate disease in left main and proximal LAD with LAD to RCA collaterals SP subcutaneous [employer] ICD placement recently seen by Dr. Inocencio on 10/08/2023 with recurrent ICD discharge due to recurrent VT VF requiring 6 shocks on 10/05/2023 patient unaware.   Assessment & Plan   CAD He was admitted with unstable angina and negative enzymes.  Left and right heart catheterization as above showed three-vessel disease with occluded RCA, left-to-right collaterals, occluded LCx, and a high-grade calcified proximal LAD diagonal branch bifurcation stenosis.  No PCI, CT surgery was consulted but ultimately turned down for CABG as he is not a good surgical candidate.  Recommendations were made for high risk PCI versus medical therapy/palliative care.   History of VT/VF ICD in place   Systolic heart failure Pulmonary hypertension Echocardiogram this admission showed an LVEF less than 20%.  Volume managed with HD.  GDMT with low-dose carvedilol.  No BP or HR to increase medical therapy. RV systolic pressure was 67 mmHg.   ESRD on HD He was dialyzed yesterday with tentative plans for dialysis today to get him back on his MWF schedule.  Unfortunately this is complicated by scheduled heart catheterization at 3 PM today   PAD Bilateral iliac artery stents felt to be patent during abdominal aortogram at the time of heart catheterization this admission.   Disposition Difficult situation given his reduced EF, ESRD, PAD, and severe three-vessel CAD.  Case was discussed with interventional team and the patient who has agreed to move forward with protected high risk PCI scheduled for today.      For  questions or updates, please contact [facility] Please consult [email or web address] for contact info under     Signed, Jon Nat Hails, PA  10/17/2023, 10:33 AM     Agree with note by Jon Hails PA-C  Patient with known three-vessel disease, turndown for CABG by Dr. Lucas.  He is scheduled for high risk PCI by Dr.Thukkani  this afternoon which is the Impella supported.  He is a dialysis patient and dialysis yesterday.  This to be done through the femoral approach We discussed his options including risks and benefits.  Dorn DOROTHA Lesches, M.D., FACP, [facility], LYNITA [facility] [address] [place], [place]  [address]  [phone] 10/17/2023 10:40 AM
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
## 2023-10-18 ENCOUNTER — Encounter (HOSPITAL_COMMUNITY): Payer: Self-pay | Admitting: Internal Medicine

## 2023-10-18 DIAGNOSIS — I4901 Ventricular fibrillation: Secondary | ICD-10-CM

## 2023-10-18 DIAGNOSIS — I25110 Atherosclerotic heart disease of native coronary artery with unstable angina pectoris: Secondary | ICD-10-CM | POA: Diagnosis not present

## 2023-10-18 DIAGNOSIS — I5022 Chronic systolic (congestive) heart failure: Secondary | ICD-10-CM | POA: Diagnosis not present

## 2023-10-18 DIAGNOSIS — D649 Anemia, unspecified: Secondary | ICD-10-CM | POA: Diagnosis not present

## 2023-10-18 LAB — CBC WITH DIFFERENTIAL/PLATELET
Abs Immature Granulocytes: 0.06 10*3/uL (ref 0.00–0.07)
Basophils Absolute: 0 10*3/uL (ref 0.0–0.1)
Basophils Relative: 0 %
Eosinophils Absolute: 0 10*3/uL (ref 0.0–0.5)
Eosinophils Relative: 0 %
HCT: 26.4 % — ABNORMAL LOW (ref 39.0–52.0)
Hemoglobin: 8.8 g/dL — ABNORMAL LOW (ref 13.0–17.0)
Immature Granulocytes: 1 %
Lymphocytes Relative: 1 %
Lymphs Abs: 0.2 10*3/uL — ABNORMAL LOW (ref 0.7–4.0)
MCH: 33 pg (ref 26.0–34.0)
MCHC: 33.3 g/dL (ref 30.0–36.0)
MCV: 98.9 fL (ref 80.0–100.0)
Monocytes Absolute: 0.5 10*3/uL (ref 0.1–1.0)
Monocytes Relative: 4 %
Neutro Abs: 11.9 10*3/uL — ABNORMAL HIGH (ref 1.7–7.7)
Neutrophils Relative %: 94 %
Platelets: 147 10*3/uL — ABNORMAL LOW (ref 150–400)
RBC: 2.67 MIL/uL — ABNORMAL LOW (ref 4.22–5.81)
RDW: 14.1 % (ref 11.5–15.5)
WBC: 12.6 10*3/uL — ABNORMAL HIGH (ref 4.0–10.5)
nRBC: 0 % (ref 0.0–0.2)

## 2023-10-18 LAB — LACTIC ACID, PLASMA: Lactic Acid, Venous: 0.9 mmol/L (ref 0.5–1.9)

## 2023-10-18 LAB — POCT ACTIVATED CLOTTING TIME
Activated Clotting Time: 164 s
Activated Clotting Time: 262 s

## 2023-10-18 LAB — CBC
HCT: 25.6 % — ABNORMAL LOW (ref 39.0–52.0)
Hemoglobin: 8.8 g/dL — ABNORMAL LOW (ref 13.0–17.0)
MCH: 33.2 pg (ref 26.0–34.0)
MCHC: 34.4 g/dL (ref 30.0–36.0)
MCV: 96.6 fL (ref 80.0–100.0)
Platelets: 165 10*3/uL (ref 150–400)
RBC: 2.65 MIL/uL — ABNORMAL LOW (ref 4.22–5.81)
RDW: 14.2 % (ref 11.5–15.5)
WBC: 11.5 10*3/uL — ABNORMAL HIGH (ref 4.0–10.5)
nRBC: 0.2 % (ref 0.0–0.2)

## 2023-10-18 LAB — BASIC METABOLIC PANEL WITH GFR
Anion gap: 17 — ABNORMAL HIGH (ref 5–15)
BUN: 54 mg/dL — ABNORMAL HIGH (ref 8–23)
CO2: 18 mmol/L — ABNORMAL LOW (ref 22–32)
Calcium: 8.9 mg/dL (ref 8.9–10.3)
Chloride: 91 mmol/L — ABNORMAL LOW (ref 98–111)
Creatinine, Ser: 5.11 mg/dL — ABNORMAL HIGH (ref 0.61–1.24)
GFR, Estimated: 11 mL/min — ABNORMAL LOW (ref >60)
Glucose, Bld: 187 mg/dL — ABNORMAL HIGH (ref 70–99)
Potassium: 4.7 mmol/L (ref 3.5–5.1)
Sodium: 126 mmol/L — ABNORMAL LOW (ref 135–145)

## 2023-10-18 MED ORDER — MIDODRINE HCL 5 MG PO TABS
5.0000 mg | ORAL_TABLET | Freq: Three times a day (TID) | ORAL | Status: DC
  Administered 2023-10-19: 5 mg via ORAL
  Filled 2023-10-18: qty 1

## 2023-10-18 MED ORDER — DARBEPOETIN ALFA 40 MCG/0.4ML IJ SOSY
40.0000 ug | PREFILLED_SYRINGE | Freq: Once | INTRAMUSCULAR | Status: AC
  Administered 2023-10-18: 40 ug via SUBCUTANEOUS
  Filled 2023-10-18: qty 0.4

## 2023-10-18 MED ORDER — CHLORHEXIDINE GLUCONATE CLOTH 2 % EX PADS
6.0000 | MEDICATED_PAD | Freq: Every day | CUTANEOUS | Status: DC
  Administered 2023-10-19 – 2023-10-21 (×3): 6 via TOPICAL

## 2023-10-18 MED ORDER — MIDODRINE HCL 5 MG PO TABS
5.0000 mg | ORAL_TABLET | Freq: Three times a day (TID) | ORAL | Status: DC
Start: 2023-10-18 — End: 2023-10-18

## 2023-10-18 MED ORDER — ATORVASTATIN CALCIUM 80 MG PO TABS
80.0000 mg | ORAL_TABLET | Freq: Every day | ORAL | Status: DC
  Administered 2023-10-19 – 2023-10-24 (×6): 80 mg via ORAL
  Filled 2023-10-18 (×6): qty 1

## 2023-10-18 MED ORDER — MIDODRINE HCL 5 MG PO TABS
10.0000 mg | ORAL_TABLET | Freq: Three times a day (TID) | ORAL | Status: AC
  Administered 2023-10-18 (×2): 10 mg via ORAL
  Filled 2023-10-18 (×2): qty 2

## 2023-10-18 MED ORDER — METOPROLOL SUCCINATE ER 25 MG PO TB24: 12.5000 mg | ORAL_TABLET | Freq: Every day | ORAL | Status: DC

## 2023-10-18 MED ORDER — MIDODRINE HCL 5 MG PO TABS: 10.0000 mg | ORAL_TABLET | Freq: Three times a day (TID) | ORAL | Status: DC

## 2023-10-18 NOTE — Progress Notes (Signed)
 14 french sheath aspirated and removed from left femoral artery.  4 inch by 4 inch hematoma present prior to removal.  Manual pressure applied for 45 minutes.  Hematoma successfully expressed. No hematoma present. Some ecchymosis present distal to sheath site. Tegaderm dressing applied, bedrest instructions given. Patient has mittens on both hands.  Bilateral dp and pt pulses present with doppler and marked.     Bedrest begins at 12:15:00

## 2023-10-18 NOTE — Progress Notes (Addendum)
 Called by nursing staff  to report  back pain and hypotension.    Midodrine  increased to 10 mg TID.   On arrival L groin hematoma resolved. Mild tenderness.   Check CBC now.   Discussed with Dr Zenaida. Give ASA + Brillinta now.   Kipp Shank NP-C  3:33 PM   CBC reviewed. Hemoglobin stable.   Fredderick Swanger NP-C  4:06 PM

## 2023-10-18 NOTE — Progress Notes (Signed)
 [facility] Progress Note   Subjective: Seen in ICU.  Underwent cardiac cath with stent placed. Post procedure hematoma. Alert, just feels rough. For dialysis today.   Objective Vitals:   10/18/23 0530 10/18/23 0700 10/18/23 0800 10/18/23 0905  BP: (!) 129/54 95/79 98/64  (!) 107/53  Pulse: 68 64 69 63  Resp: (!) 24 20 19    Temp: (!) 96.8 F (36 C) (!) 96.4 F (35.8 C) (!) 96.6 F (35.9 C)   TempSrc:   Core   SpO2: 93% 95% 93%   Weight:      Height:         Additional Objective Labs: Basic Metabolic Panel: Recent Labs  Lab 10/16/23 0404 10/17/23 0418 10/17/23 1552 10/17/23 1555 10/17/23 2120 10/18/23 0739  NA 126* 128*   < > 131* 127* 126*  K 5.2* 5.1   < > 4.5 4.9 4.7  CL 93* 94*  --   --   --  91*  CO2 25 22  --   --   --  18*  GLUCOSE 102* 90  --   --   --  187*  BUN 58* 39*  --   --   --  54*  CREATININE 5.27* 4.00*  --   --   --  5.11*  CALCIUM  8.7* 8.7*  --   --   --  8.9  PHOS  --  4.8*  --   --   --   --    < > = values in this interval not displayed.   CBC: Recent Labs  Lab 10/14/23 0557 10/15/23 0549 10/15/23 0938 10/16/23 0404 10/17/23 0418 10/17/23 1552 10/17/23 2119 10/17/23 2120 10/18/23 0217  WBC 8.0 7.9  --  8.5 15.1*  --   --   --  12.6*  NEUTROABS 6.1  --   --   --   --   --   --   --  11.9*  HGB 10.7* 10.9*   < > 10.4* 11.0*   < > 9.1* 8.8* 8.8*  HCT 32.4* 32.6*   < > 31.4* 32.9*   < > 26.8* 26.0* 26.4*  MCV 100.0 99.1  --  99.4 99.7  --   --   --  98.9  PLT 170 152  --  146* 152  --   --   --  147*   < > = values in this interval not displayed.   Blood Culture No results found for: SDES, SPECREQUEST, CULT, REPTSTATUS   Physical Exam General: Lying bed, nad Heart: RRR Lungs: Clear Abdomen: soft, non tender Extremities: no LE edema Dialysis Access: R AVF +T/B  Medications:  sodium chloride       allopurinol   100 mg Oral q1800   amiodarone   200 mg Oral Daily   aspirin  EC  81 mg Oral Daily    atorvastatin   40 mg Oral Daily   carvedilol  3.125 mg Oral BID WC   Chlorhexidine  Gluconate Cloth  6 each Topical Q0600   heparin   5,000 Units Subcutaneous Q8H   latanoprost   1 drop Both Eyes QHS   mexiletine  250 mg Oral BID   midodrine   10 mg Oral Q M,W,F-HD   midodrine   5 mg Oral Once per day on Sunday Tuesday Thursday Saturday   pyridoxine  100 mg Oral Q1500   sodium chloride  flush  3 mL Intravenous Q12H   sodium chloride  flush  3 mL Intravenous Q12H   ticagrelor  90 mg Oral  BID   torsemide   100 mg Oral Once per day on Sunday Tuesday Thursday Saturday   traZODone  50 mg Oral QHS    Dialysis Orders:  MWF - [facility] 3:30hr, 400/A1.5, EDW 67.3kg, 2K/2.5Ca, UFP #2, AVF, no heparin  - No ESA - Calcitriol  0.75mcg PO q HD - Sensipar 30mg  PO q HD   Assessment/Plan: CAD/Unstable angina. Cardiology following, LHC 6/23 with severe multivessel disease. CTS consulted, poor candidate for surgery. Underwent cardiac cath 6/25 with stents placed. Post procedure groin hematoma. Per cardiology.   Hx VT/VF.  Unstable recently. Cardiology pursuing mexilitine this admit. ESRD:  HD MWF. Off schedule this week.  Next HD Thurs. No added heparin  with HD  Hypotension/volume: BP soft post procedure.  On low dose Coreg and uses midodrine  pre-HD. No LE edema, some vol on CXR.  Anemia: Hgb 10.9 >8.8.  Will order ESA.   Metabolic bone disease: Ca/Phos in range, continue home meds.  T2DM  HFrEF, severe CAD  COPD  Hx CVA  James Chan James Acosta PA-C [facility] 10/18/2023,10:40 AM

## 2023-10-18 NOTE — Consult Note (Signed)
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 Aortic Atherosclerosis (ICD10-I70.0). Electronically Signed   By: Greig Pique M.D.   On: 10/17/2023 23:40   CARDIAC CATHETERIZATION Result Date: 10/17/2023   Dist LM lesion is 40% stenosed.   Prox LAD to Mid LAD lesion is 90% stenosed.   Mid Cx to Dist Cx lesion is 100% stenosed.   Mid Cx lesion is 60% stenosed.   Ost RCA to Dist RCA lesion is 100% stenosed.   1st Diag lesion is 95% stenosed.   A stent was successfully placed.   A stent was successfully placed.   Post intervention, there is a 0% residual stenosis.   Post intervention, there is a 0% residual stenosis. 1.  Right IJ Swan placement and right heart  catheterization demonstrating Fick cardiac output of 7.24 L/min, Fick cardiac index of 4.0 L/min/m, thermodilution cardiac output of 3.8 L/min and thermodilution cardiac index of 2.1 L/min/m with the following hemodynamics:  Right atrial pressure mean of 16 mmHg  Right ventricular pressure 67/20 with an end-diastolic pressure of 19 mmHg  Wedge pressure mean of 23 mmHg with V waves to 33 mmHg  PA pressure of 68/31 with a mean of 30 mmHg  PVR of 2.1 Woods units by Fick; 3.9 Wood units by thermal dilution  PA pulsatility index of 2.3 2.  Successful high risk, complex PCI of LAD diagonal bifurcation using orbital atherectomy of both vessels, Cutting Balloon angioplasty of both vessels, and culotte technique.  Procedure was not performed with Impella support due to inability to pass the Impella through the left common iliac and abdominal aortic aneurysm (see procedural notes). 3.  LVEDP of 21 mmHg Recommendations: The results were reviewed with the patient's family and Dr. Court, the 14 French indwelling sheath in the left common femoral artery will be removed when the ACT is less than 160.  The patient be transferred to to heart unit for further monitoring.   DG CHEST PORT 1 VIEW Result Date: 10/17/2023 CLINICAL DATA:  [id number] Acute exacerbation of CHF (congestive heart failure) (HCC) [id number]. EXAM: PORTABLE CHEST 1 VIEW COMPARISON:  10/14/2023. FINDINGS: Low lung volume. Redemonstration of moderate diffuse pulmonary vascular congestion and bilateral small-to-moderate layering pleural effusions, right more than left. There also probable associated compressive atelectatic changes in the bilateral lungs. Overall, findings appear essentially unchanged since the prior study. No pneumothorax on either side. Stable moderately enlarged cardio-mediastinal silhouette. Stable appearance of AICD. No acute osseous abnormalities. The soft tissues are within normal limits. IMPRESSION: *Essentially stable findings compatible with  congestive heart failure/pulmonary edema. Electronically Signed   By: Ree Molt M.D.   On: 10/17/2023 14:01     Medications:     Current Medications:  allopurinol   100 mg Oral q1800   amiodarone   200 mg Oral Daily   aspirin  EC  81 mg Oral Daily   [START ON 10/19/2023] atorvastatin   80 mg Oral Daily   Chlorhexidine  Gluconate Cloth  6 each Topical Q0600   darbepoetin (ARANESP) injection - DIALYSIS  40 mcg Subcutaneous Once   heparin   5,000 Units Subcutaneous Q8H   latanoprost   1 drop Both Eyes QHS   [START ON 10/19/2023] metoprolol  succinate  12.5 mg Oral Daily   mexiletine  250 mg Oral BID   midodrine   10 mg Oral Q M,W,F-HD  midodrine   5 mg Oral Once per day on Sunday Tuesday Thursday Saturday   pyridoxine  100 mg Oral Q1500   sodium chloride  flush  3 mL Intravenous Q12H   sodium chloride  flush  3 mL Intravenous Q12H   ticagrelor  90 mg Oral BID   torsemide   100 mg Oral Once per day on Sunday Tuesday Thursday Saturday   traZODone  50 mg Oral QHS    Infusions:  sodium chloride         Patient Profile   James Chan is a 73 year old with history of ESRD, HTN, PAD, DMII, gout, VT, HFrEF, and ICD.   Admitted with unstable angina. Cath with multivessel disease.   Assessment/Plan   CAD unstable angina  Multivessel CAD.   Cath- 40% distal LMCA stenosis, 90% proximal LAD stenosis involving large D1 branch with heavy calcification, sequential 60% and 100% mid and distal LCx stenoses, and chronic total occlusion of ostial through distal RCA. Evaluated by CT surgery and turned down due to extensive calcification of ascending aorta S/P high risk DES LAD.  Remove swan today   Continue aspirin  + brillinta + statin Lactic acid cleared.   Anemia Hematoma post cath. Hgb down from 11>8.8. Follow CBC.   VT/VF [employer] ICD 10/05/23 had 6 shock.  Continue amio 200 mg daily+ mexiletine 250 mg twice a day  Chronic HFrEf, ICM Echo EF LV < 20% RV mildly reduced  Volume  managed with HD No GDMT with midodrine .   ESRD Nephrology following. Plan for iHD today.   Transfer out of unit tomorrow if remains stable.   Length of Stay: 4  Lyza Houseworth, NP  10/18/2023, 11:27 AM    Advanced Heart Failure Team Pager [phone] (M-F; 7a - 5p)  Please contact [facility] for night-coverage after hours (4p -7a ) and weekends on [email or web address]

## 2023-10-19 DIAGNOSIS — I5022 Chronic systolic (congestive) heart failure: Secondary | ICD-10-CM | POA: Diagnosis not present

## 2023-10-19 DIAGNOSIS — E871 Hypo-osmolality and hyponatremia: Secondary | ICD-10-CM

## 2023-10-19 LAB — CBC WITH DIFFERENTIAL/PLATELET
Abs Immature Granulocytes: 0.12 10*3/uL — ABNORMAL HIGH (ref 0.00–0.07)
Basophils Absolute: 0 10*3/uL (ref 0.0–0.1)
Basophils Relative: 0 %
Eosinophils Absolute: 0 10*3/uL (ref 0.0–0.5)
Eosinophils Relative: 0 %
HCT: 26.3 % — ABNORMAL LOW (ref 39.0–52.0)
Hemoglobin: 9.1 g/dL — ABNORMAL LOW (ref 13.0–17.0)
Immature Granulocytes: 1 %
Lymphocytes Relative: 3 %
Lymphs Abs: 0.6 10*3/uL — ABNORMAL LOW (ref 0.7–4.0)
MCH: 33.6 pg (ref 26.0–34.0)
MCHC: 34.6 g/dL (ref 30.0–36.0)
MCV: 97 fL (ref 80.0–100.0)
Monocytes Absolute: 1.1 10*3/uL — ABNORMAL HIGH (ref 0.1–1.0)
Monocytes Relative: 7 %
Neutro Abs: 14.5 10*3/uL — ABNORMAL HIGH (ref 1.7–7.7)
Neutrophils Relative %: 89 %
Platelets: 192 10*3/uL (ref 150–400)
RBC: 2.71 MIL/uL — ABNORMAL LOW (ref 4.22–5.81)
RDW: 14.6 % (ref 11.5–15.5)
WBC: 16.4 10*3/uL — ABNORMAL HIGH (ref 4.0–10.5)
nRBC: 0.2 % (ref 0.0–0.2)

## 2023-10-19 LAB — RENAL FUNCTION PANEL
Albumin: 3 g/dL — ABNORMAL LOW (ref 3.5–5.0)
Anion gap: 15 (ref 5–15)
BUN: 64 mg/dL — ABNORMAL HIGH (ref 8–23)
CO2: 22 mmol/L (ref 22–32)
Calcium: 9.1 mg/dL (ref 8.9–10.3)
Chloride: 89 mmol/L — ABNORMAL LOW (ref 98–111)
Creatinine, Ser: 6.05 mg/dL — ABNORMAL HIGH (ref 0.61–1.24)
GFR, Estimated: 9 mL/min — ABNORMAL LOW (ref >60)
Glucose, Bld: 148 mg/dL — ABNORMAL HIGH (ref 70–99)
Phosphorus: 7 mg/dL — ABNORMAL HIGH (ref 2.5–4.6)
Potassium: 5.7 mmol/L — ABNORMAL HIGH (ref 3.5–5.1)
Sodium: 126 mmol/L — ABNORMAL LOW (ref 135–145)

## 2023-10-19 LAB — CBC
HCT: 26.6 % — ABNORMAL LOW (ref 39.0–52.0)
Hemoglobin: 9.1 g/dL — ABNORMAL LOW (ref 13.0–17.0)
MCH: 33.2 pg (ref 26.0–34.0)
MCHC: 34.2 g/dL (ref 30.0–36.0)
MCV: 97.1 fL (ref 80.0–100.0)
Platelets: 174 10*3/uL (ref 150–400)
RBC: 2.74 MIL/uL — ABNORMAL LOW (ref 4.22–5.81)
RDW: 14.5 % (ref 11.5–15.5)
WBC: 12.9 10*3/uL — ABNORMAL HIGH (ref 4.0–10.5)
nRBC: 0.2 % (ref 0.0–0.2)

## 2023-10-19 LAB — LACTIC ACID, PLASMA: Lactic Acid, Venous: 1.4 mmol/L (ref 0.5–1.9)

## 2023-10-19 MED ORDER — MIDODRINE HCL 5 MG PO TABS
15.0000 mg | ORAL_TABLET | Freq: Three times a day (TID) | ORAL | Status: DC
  Administered 2023-10-19 – 2023-10-21 (×6): 15 mg via ORAL
  Filled 2023-10-19 (×6): qty 3

## 2023-10-19 MED ORDER — NEPRO/CARBSTEADY PO LIQD
237.0000 mL | ORAL | Status: DC | PRN
Start: 2023-10-19 — End: 2023-10-22

## 2023-10-19 MED ORDER — ALTEPLASE 2 MG IJ SOLR: 2.0000 mg | Freq: Once | INTRAMUSCULAR | Status: DC | PRN

## 2023-10-19 MED ORDER — PENTAFLUOROPROP-TETRAFLUOROETH EX AERO
1.0000 | INHALATION_SPRAY | CUTANEOUS | Status: DC | PRN
Start: 2023-10-19 — End: 2023-10-21

## 2023-10-19 MED ORDER — LIDOCAINE HCL (PF) 1 % IJ SOLN: 5.0000 mL | INTRAMUSCULAR | Status: DC | PRN

## 2023-10-19 MED ORDER — HEPARIN SODIUM (PORCINE) 1000 UNIT/ML DIALYSIS
1000.0000 [IU] | INTRAMUSCULAR | Status: DC | PRN
Start: 2023-10-19 — End: 2023-10-21

## 2023-10-19 MED ORDER — ANTICOAGULANT SODIUM CITRATE 4% (200MG/5ML) IV SOLN
5.0000 mL | Status: DC | PRN
  Filled 2023-10-19: qty 5

## 2023-10-19 MED ORDER — LIDOCAINE-PRILOCAINE 2.5-2.5 % EX CREA
1.0000 | TOPICAL_CREAM | CUTANEOUS | Status: DC | PRN
  Filled 2023-10-19: qty 5

## 2023-10-19 NOTE — Progress Notes (Addendum)
 Advanced Heart Failure Rounding Note  Cardiologist: Redell Leiter, MD  Chief Complaint: CAD / unstable angina Subjective:    Resting in bed. Plan for iHD today, deferred yesterday with hypotension. SBP 70s-low 100s  Objective:   Weight Range: 69.8 kg Body mass index is 24.82 kg/m.   Vital Signs:   Temp:  [96.8 F (36 C)-97.7 F (36.5 C)] 97.4 F (36.3 C) (06/27 0400) Pulse Rate:  [48-64] 59 (06/27 0830) Resp:  [9-28] 24 (06/27 0830) BP: (72-132)/(25-101) 107/56 (06/27 0830) SpO2:  [88 %-100 %] 97 % (06/27 0830) Last BM Date : 10/16/23  Weight change: Filed Weights   10/16/23 0756 10/16/23 1147 10/17/23 0500  Weight: 72.6 kg 72.2 kg 69.8 kg   Intake/Output:   Intake/Output Summary (Last 24 hours) at 10/19/2023 0846 Last data filed at 10/18/2023 2136 Gross per 24 hour  Intake 266 ml  Output 200 ml  Net 66 ml    Physical Exam  General:  drowsy appearing.  No respiratory difficulty Neck: supple. JVD elevated.  Cor: PMI nondisplaced. Regular rate & rhythm. No rubs, gallops or murmurs. Lungs: clear Extremities: no cyanosis, clubbing, rash, edema. Fistula RUE Neuro: alert & oriented x 3. Affect pleasant.   Telemetry   NSR 60s (Personally reviewed)    EKG    No new EKG to review  Labs    CBC Recent Labs    10/18/23 0217 10/18/23 1457 10/19/23 0218  WBC 12.6* 11.5* 12.9*  NEUTROABS 11.9*  --   --   HGB 8.8* 8.8* 9.1*  HCT 26.4* 25.6* 26.6*  MCV 98.9 96.6 97.1  PLT 147* 165 174   Basic Metabolic Panel Recent Labs    93/74/74 0418 10/17/23 1552 10/18/23 0739 10/19/23 0218  NA 128*   < > 126* 126*  K 5.1   < > 4.7 5.7*  CL 94*  --  91* 89*  CO2 22  --  18* 22  GLUCOSE 90  --  187* 148*  BUN 39*  --  54* 64*  CREATININE 4.00*  --  5.11* 6.05*  CALCIUM  8.7*  --  8.9 9.1  PHOS 4.8*  --   --  7.0*   < > = values in this interval not displayed.   Liver Function Tests Recent Labs    10/17/23 0418 10/19/23 0218  ALBUMIN 3.2* 3.0*   No  results for input(s): LIPASE, AMYLASE in the last 72 hours. Cardiac Enzymes No results for input(s): CKTOTAL, CKMB, CKMBINDEX, TROPONINI in the last 72 hours.  BNP: BNP (last 3 results) No results for input(s): BNP in the last 8760 hours.  ProBNP (last 3 results) No results for input(s): PROBNP in the last 8760 hours.   D-Dimer No results for input(s): DDIMER in the last 72 hours. Hemoglobin A1C No results for input(s): HGBA1C in the last 72 hours. Fasting Lipid Panel No results for input(s): CHOL, HDL, LDLCALC, TRIG, CHOLHDL, LDLDIRECT in the last 72 hours. Thyroid  Function Tests No results for input(s): TSH, T4TOTAL, T3FREE, THYROIDAB in the last 72 hours.  Invalid input(s): FREET3  Other results:   Imaging    No results found.   Medications:     Scheduled Medications:  allopurinol   100 mg Oral q1800   amiodarone   200 mg Oral Daily   aspirin  EC  81 mg Oral Daily   atorvastatin   80 mg Oral Daily   Chlorhexidine  Gluconate Cloth  6 each Topical Q0600   Chlorhexidine  Gluconate Cloth  6 each Topical Q0600  heparin   5,000 Units Subcutaneous Q8H   latanoprost   1 drop Both Eyes QHS   metoprolol  succinate  12.5 mg Oral Daily   mexiletine  250 mg Oral BID   midodrine   5 mg Oral Q8H   pyridoxine   100 mg Oral Q1500   sodium chloride  flush  3 mL Intravenous Q12H   sodium chloride  flush  3 mL Intravenous Q12H   ticagrelor   90 mg Oral BID   torsemide   100 mg Oral Once per day on Sunday Tuesday Thursday Saturday   traZODone   50 mg Oral QHS    Infusions:   PRN Medications: acetaminophen , albuterol , hydrALAZINE , nitroGLYCERIN , ondansetron  (ZOFRAN ) IV, oxyCODONE -acetaminophen , sodium chloride  flush, sodium chloride  flush  Patient Profile   Mr Dalzell is a 73 year old with history of ESRD, HTN, PAD, DMII, gout, VT/VF  HFrEF, and ICD. AHF team to see for CAD/ unstable angina.   Assessment/Plan  CAD unstable angina  Multivessel  CAD.   Cath- 40% distal LMCA stenosis, 90% proximal LAD stenosis involving large D1 branch with heavy calcification, sequential 60% and 100% mid and distal LCx stenoses, and chronic total occlusion of ostial through distal RCA. Evaluated by CT surgery and turned down due to extensive calcification of ascending aorta S/P high risk DES LAD.  Continue aspirin  + brillinta + statin Lactic acid cleared.    Anemia Hematoma post cath. Hgb down from 11>8.8.  9.1 today   VT/VF [employer] ICD 10/05/23 had 6 shock.  Continue amio 200 mg daily+ mexiletine 250 mg twice a day   Chronic HFrEf, ICM Echo EF LV < 20% RV mildly reduced  Volume managed with HD No GDMT with midodrine .  Increase midodrine  to 15 today so that he is able to get iHD. (K 5.7, SCr 6, BUN 64) Stop BB and d/c PRN hydral.    ESRD Nephrology following. Plan for iHD today.   Hyponatremia 126 today Continue to follow BMET  Plan for transfer to PCU tomorrow if he tolerates iHD today  Length of Stay: 5  Beckey LITTIE Coe, NP  10/19/2023, 8:46 AM  Advanced Heart Failure Team Pager [phone] (M-F; 7a - 5p)  Please contact [facility] for night-coverage after hours (5p -7a ) and weekends on [email or web address]

## 2023-10-19 NOTE — Progress Notes (Signed)
 Received patient in bed to unit.  Alert and oriented.  Informed consent signed and in chart.   TX duration:3 hours BEDSIDE  Patient tolerated well.  Transported back to the room  Alert, without acute distress.  Hand-off given to patient's nurse.   Access used: Right AV Fistula Upper Arm Access issues: none  Total UF removed: 2L Medication(s) given: none   10/19/23 1856  Vitals  Temp 97.8 F (36.6 C)  Temp Source Oral  BP (!) 116/90  MAP (mmHg) 100  Pulse Rate 66  ECG Heart Rate 66  Resp (!) 21  Oxygen Therapy  SpO2 98 %  O2 Device Room Air  During Treatment Monitoring  Duration of HD Treatment -hour(s) 3 hour(s)  HD Safety Checks Performed Yes  Intra-Hemodialysis Comments Tx completed  Dialysis Fluid Bolus Normal Saline  Bolus Amount (mL) 300 mL  Post Treatment  Dialyzer Clearance Clear  Liters Processed 72  Fluid Removed (mL) 2000 mL  Tolerated HD Treatment Yes  Post-Hemodialysis Comments BP stable throughout session!  AVG/AVF Arterial Site Held (minutes) 7 minutes  AVG/AVF Venous Site Held (minutes) 7 minutes  Fistula / Graft Right Upper arm Arteriovenous fistula  Placement Date/Time: 10/10/18 1153   Placed prior to admission: No  Orientation: Right  Access Location: Upper arm  Access Type: (c) Arteriovenous fistula  Status Deaccessed     Camellia Brasil LPN [facility]

## 2023-10-19 NOTE — Progress Notes (Signed)
 Consent for Hemodialysis signed by patient at bedside and put in floor chart.

## 2023-10-19 NOTE — Progress Notes (Signed)
 [facility] Progress Note   Subjective: Seen in ICU. HD deferred yesterday due to hypotension. Midodrine  increased. Still feels pretty lousy. Nauseous overnight. Plan for HD today if BP holds.    Objective Vitals:   10/19/23 0830 10/19/23 0900 10/19/23 0930 10/19/23 1000  BP: (!) 107/56 (!) 90/52 110/67 (!) 141/64  Pulse: (!) 59 62 61 60  Resp: (!) 24 (!) 28 (!) 27 (!) 27  Temp:      TempSrc:      SpO2: 97% 99% 92% 97%  Weight:      Height:         Additional Objective Labs: Basic Metabolic Panel: Recent Labs  Lab 10/17/23 0418 10/17/23 1552 10/17/23 2120 10/18/23 0739 10/19/23 0218  NA 128*   < > 127* 126* 126*  K 5.1   < > 4.9 4.7 5.7*  CL 94*  --   --  91* 89*  CO2 22  --   --  18* 22  GLUCOSE 90  --   --  187* 148*  BUN 39*  --   --  54* 64*  CREATININE 4.00*  --   --  5.11* 6.05*  CALCIUM  8.7*  --   --  8.9 9.1  PHOS 4.8*  --   --   --  7.0*   < > = values in this interval not displayed.   CBC: Recent Labs  Lab 10/14/23 0557 10/15/23 0549 10/16/23 0404 10/17/23 0418 10/17/23 1552 10/18/23 0217 10/18/23 1457 10/19/23 0218  WBC 8.0   < > 8.5 15.1*  --  12.6* 11.5* 12.9*  NEUTROABS 6.1  --   --   --   --  11.9*  --   --   HGB 10.7*   < > 10.4* 11.0*   < > 8.8* 8.8* 9.1*  HCT 32.4*   < > 31.4* 32.9*   < > 26.4* 25.6* 26.6*  MCV 100.0   < > 99.4 99.7  --  98.9 96.6 97.1  PLT 170   < > 146* 152  --  147* 165 174   < > = values in this interval not displayed.   Blood Culture No results found for: SDES, SPECREQUEST, CULT, REPTSTATUS   Physical Exam General: Lying bed, nad Heart: RRR Lungs: Clear Abdomen: soft, non tender Extremities: no LE edema Dialysis Access: R AVF +T/B  Medications:  anticoagulant sodium citrate       allopurinol   100 mg Oral q1800   amiodarone   200 mg Oral Daily   aspirin  EC  81 mg Oral Daily   atorvastatin   80 mg Oral Daily   Chlorhexidine  Gluconate Cloth  6 each Topical Q0600   Chlorhexidine   Gluconate Cloth  6 each Topical Q0600   heparin   5,000 Units Subcutaneous Q8H   latanoprost   1 drop Both Eyes QHS   mexiletine  250 mg Oral BID   midodrine   15 mg Oral Q8H   pyridoxine   100 mg Oral Q1500   sodium chloride  flush  3 mL Intravenous Q12H   sodium chloride  flush  3 mL Intravenous Q12H   ticagrelor   90 mg Oral BID   torsemide   100 mg Oral Once per day on Sunday Tuesday Thursday Saturday   traZODone   50 mg Oral QHS    Dialysis Orders:  MWF - [facility] 3:30hr, 400/A1.5, EDW 67.3kg, 2K/2.5Ca, UFP #2, AVF, no heparin  - No ESA - Calcitriol  0.75mcg PO q HD - Sensipar 30mg  PO q HD   Assessment/Plan: CAD/Unstable  angina. Cardiology following, LHC 6/23 with severe multivessel disease. CTS consulted, poor candidate for surgery. Underwent cardiac cath 6/25 with stents to LAD.  Post procedure groin hematoma. Per cardiology.   Hx VT/VF.  Unstable recently. ICD in place. Cardiology pursuing mexilitine this admit. ESRD:  HD MWF.   Had HD Tues, held Thurs. Plan for today.  No added heparin  with HD  Hypotension/volume: BP soft post procedure. Off Coreg . Midodrine  added TID.  N Anemia: Hgb 10.9 >8.8.  Will give ESA here --Received Aranesp  40 on 6/26.    Metabolic bone disease: Phos above goal, usually controlled, not on binder as outpatient.   T2DM  HFrEF, severe CAD  COPD  Dartanion Teo Ronnald Acosta PA-C [facility] 10/19/2023,10:34 AM

## 2023-10-19 NOTE — Progress Notes (Signed)
   Nurse paged me informing me that when they were transferring the patient one of his peripheral IV sites came out and was purulent and foul smelling. The patient has been afebrile and without any complaints that would indicate systemic infection. Orders placed for some labs to ensure there is no infection. Sign out given to overnight MD.  Waddell DELENA Donath, PA-C 10/19/2023 8:58 PM

## 2023-10-20 DIAGNOSIS — D649 Anemia, unspecified: Secondary | ICD-10-CM | POA: Diagnosis not present

## 2023-10-20 DIAGNOSIS — I25110 Atherosclerotic heart disease of native coronary artery with unstable angina pectoris: Secondary | ICD-10-CM | POA: Diagnosis not present

## 2023-10-20 DIAGNOSIS — I472 Ventricular tachycardia, unspecified: Secondary | ICD-10-CM | POA: Diagnosis not present

## 2023-10-20 LAB — COMPREHENSIVE METABOLIC PANEL WITH GFR
ALT: 111 U/L — ABNORMAL HIGH (ref 0–44)
AST: 96 U/L — ABNORMAL HIGH (ref 15–41)
Albumin: 2.7 g/dL — ABNORMAL LOW (ref 3.5–5.0)
Alkaline Phosphatase: 78 U/L (ref 38–126)
Anion gap: 15 (ref 5–15)
BUN: 44 mg/dL — ABNORMAL HIGH (ref 8–23)
CO2: 23 mmol/L (ref 22–32)
Calcium: 8.8 mg/dL — ABNORMAL LOW (ref 8.9–10.3)
Chloride: 90 mmol/L — ABNORMAL LOW (ref 98–111)
Creatinine, Ser: 5.06 mg/dL — ABNORMAL HIGH (ref 0.61–1.24)
GFR, Estimated: 12 mL/min — ABNORMAL LOW (ref >60)
Glucose, Bld: 110 mg/dL — ABNORMAL HIGH (ref 70–99)
Potassium: 4.4 mmol/L (ref 3.5–5.1)
Sodium: 128 mmol/L — ABNORMAL LOW (ref 135–145)
Total Bilirubin: 1.3 mg/dL — ABNORMAL HIGH (ref 0.0–1.2)
Total Protein: 6 g/dL — ABNORMAL LOW (ref 6.5–8.1)

## 2023-10-20 LAB — CBC WITH DIFFERENTIAL/PLATELET
Abs Immature Granulocytes: 0.12 10*3/uL — ABNORMAL HIGH (ref 0.00–0.07)
Basophils Absolute: 0 10*3/uL (ref 0.0–0.1)
Basophils Relative: 0 %
Eosinophils Absolute: 0.1 10*3/uL (ref 0.0–0.5)
Eosinophils Relative: 1 %
HCT: 27.1 % — ABNORMAL LOW (ref 39.0–52.0)
Hemoglobin: 9.3 g/dL — ABNORMAL LOW (ref 13.0–17.0)
Immature Granulocytes: 1 %
Lymphocytes Relative: 4 %
Lymphs Abs: 0.5 10*3/uL — ABNORMAL LOW (ref 0.7–4.0)
MCH: 33.8 pg (ref 26.0–34.0)
MCHC: 34.3 g/dL (ref 30.0–36.0)
MCV: 98.5 fL (ref 80.0–100.0)
Monocytes Absolute: 1.4 10*3/uL — ABNORMAL HIGH (ref 0.1–1.0)
Monocytes Relative: 10 %
Neutro Abs: 12 10*3/uL — ABNORMAL HIGH (ref 1.7–7.7)
Neutrophils Relative %: 84 %
Platelets: 165 10*3/uL (ref 150–400)
RBC: 2.75 MIL/uL — ABNORMAL LOW (ref 4.22–5.81)
RDW: 14.8 % (ref 11.5–15.5)
WBC: 14.2 10*3/uL — ABNORMAL HIGH (ref 4.0–10.5)
nRBC: 0.4 % — ABNORMAL HIGH (ref 0.0–0.2)

## 2023-10-20 LAB — BLOOD CULTURE ID PANEL (REFLEXED) - BCID2
A.calcoaceticus-baumannii: NOT DETECTED
Bacteroides fragilis: NOT DETECTED
Candida albicans: NOT DETECTED
Candida auris: NOT DETECTED
Candida glabrata: NOT DETECTED
Candida krusei: NOT DETECTED
Candida parapsilosis: NOT DETECTED
Candida tropicalis: NOT DETECTED
Cryptococcus neoformans/gattii: NOT DETECTED
Enterobacter cloacae complex: NOT DETECTED
Enterobacterales: NOT DETECTED
Enterococcus Faecium: NOT DETECTED
Enterococcus faecalis: NOT DETECTED
Escherichia coli: NOT DETECTED
Haemophilus influenzae: NOT DETECTED
Klebsiella aerogenes: NOT DETECTED
Klebsiella oxytoca: NOT DETECTED
Klebsiella pneumoniae: NOT DETECTED
Listeria monocytogenes: NOT DETECTED
Meth resistant mecA/C and MREJ: NOT DETECTED
Neisseria meningitidis: NOT DETECTED
Proteus species: NOT DETECTED
Pseudomonas aeruginosa: NOT DETECTED
Salmonella species: NOT DETECTED
Serratia marcescens: NOT DETECTED
Staphylococcus epidermidis: NOT DETECTED
Staphylococcus lugdunensis: NOT DETECTED
Staphylococcus species: DETECTED — AB
Stenotrophomonas maltophilia: NOT DETECTED
Streptococcus agalactiae: NOT DETECTED
Streptococcus pneumoniae: NOT DETECTED
Streptococcus pyogenes: NOT DETECTED
Streptococcus species: NOT DETECTED

## 2023-10-20 LAB — HEMOGLOBIN A1C
Hgb A1c MFr Bld: 4.6 % — ABNORMAL LOW (ref 4.8–5.6)
Mean Plasma Glucose: 85.32 mg/dL

## 2023-10-20 LAB — CULTURE, BLOOD (ROUTINE X 2)

## 2023-10-20 MED ORDER — CEFAZOLIN SODIUM-DEXTROSE 1-4 GM/50ML-% IV SOLN
1.0000 g | INTRAVENOUS | Status: DC
  Administered 2023-10-20: 1 g via INTRAVENOUS
  Filled 2023-10-20 (×2): qty 50

## 2023-10-20 MED ORDER — INSULIN ASPART 100 UNIT/ML IJ SOLN: 0.0000 [IU] | Freq: Three times a day (TID) | INTRAMUSCULAR | Status: DC

## 2023-10-20 NOTE — Progress Notes (Signed)
 [facility] Progress Note   Subjective:    Seen and examined patient at bedside. Lying in bed, no acute complaints. Tolerated yesterday's HD with net UF 2L.  Objective Vitals:   10/20/23 0550 10/20/23 0737 10/20/23 1205 10/20/23 1503  BP: (!) 104/42 (!) 129/56 (!) 118/54 (!) 130/58  Pulse: (!) 56 (!) 57 (!) 55 (!) 52  Resp: 17 18 19 16   Temp:  97.6 F (36.4 C) 97.6 F (36.4 C) 97.6 F (36.4 C)  TempSrc:  Oral Oral Oral  SpO2: 95% 95% 95% 99%  Weight:      Height:       Physical Exam General: Lying bed, nad Heart: RRR Lungs: Clear Abdomen: soft, non tender Extremities: no LE edema Dialysis Access: R AVF +T/B  Filed Weights   10/17/23 0500 10/19/23 1530 10/19/23 1904  Weight: 69.8 kg 70.7 kg 68.5 kg    Intake/Output Summary (Last 24 hours) at 10/20/2023 1511 Last data filed at 10/20/2023 1300 Gross per 24 hour  Intake 360 ml  Output 2000 ml  Net -1640 ml    Additional Objective Labs: Basic Metabolic Panel: Recent Labs  Lab 10/17/23 0418 10/17/23 1552 10/17/23 2120 10/18/23 0739 10/19/23 0218  NA 128*   < > 127* 126* 126*  K 5.1   < > 4.9 4.7 5.7*  CL 94*  --   --  91* 89*  CO2 22  --   --  18* 22  GLUCOSE 90  --   --  187* 148*  BUN 39*  --   --  54* 64*  CREATININE 4.00*  --   --  5.11* 6.05*  CALCIUM  8.7*  --   --  8.9 9.1  PHOS 4.8*  --   --   --  7.0*   < > = values in this interval not displayed.   Liver Function Tests: Recent Labs  Lab 10/17/23 0418 10/19/23 0218  ALBUMIN 3.2* 3.0*   No results for input(s): LIPASE, AMYLASE in the last 168 hours. CBC: Recent Labs  Lab 10/14/23 0557 10/15/23 0549 10/17/23 0418 10/17/23 1552 10/18/23 0217 10/18/23 1457 10/19/23 0218 10/19/23 2235  WBC 8.0   < > 15.1*  --  12.6* 11.5* 12.9* 16.4*  NEUTROABS 6.1  --   --   --  11.9*  --   --  14.5*  HGB 10.7*   < > 11.0*   < > 8.8* 8.8* 9.1* 9.1*  HCT 32.4*   < > 32.9*   < > 26.4* 25.6* 26.6* 26.3*  MCV 100.0   < > 99.7  --  98.9  96.6 97.1 97.0  PLT 170   < > 152  --  147* 165 174 192   < > = values in this interval not displayed.   Blood Culture    Component Value Date/Time   SDES BLOOD LEFT HAND 10/19/2023 2234   SPECREQUEST  10/19/2023 2234    BOTTLES DRAWN AEROBIC AND ANAEROBIC Blood Culture adequate volume   CULT  10/19/2023 2234    CULTURE REINCUBATED FOR BETTER GROWTH Performed at [facility], [address], [place], [place] [address]    REPTSTATUS PENDING 10/19/2023 2234    Cardiac Enzymes: No results for input(s): CKTOTAL, CKMB, CKMBINDEX, TROPONINI in the last 168 hours. CBG: Recent Labs  Lab 10/17/23 2026  GLUCAP 118*   Iron Studies: No results for input(s): IRON, TIBC, TRANSFERRIN, FERRITIN in the last 72 hours. Lab Results  Component Value Date  INR 1.1 01/14/2020   Studies/Results: No results found.  Medications:  anticoagulant sodium citrate       ceFAZolin  (ANCEF ) IV      allopurinol   100 mg Oral q1800   amiodarone   200 mg Oral Daily   aspirin  EC  81 mg Oral Daily   atorvastatin   80 mg Oral Daily   Chlorhexidine  Gluconate Cloth  6 each Topical Q0600   Chlorhexidine  Gluconate Cloth  6 each Topical Q0600   heparin   5,000 Units Subcutaneous Q8H   latanoprost   1 drop Both Eyes QHS   mexiletine  250 mg Oral BID   midodrine   15 mg Oral Q8H   pyridoxine   100 mg Oral Q1500   sodium chloride  flush  3 mL Intravenous Q12H   sodium chloride  flush  3 mL Intravenous Q12H   ticagrelor   90 mg Oral BID   torsemide   100 mg Oral Once per day on Sunday Tuesday Thursday Saturday   traZODone   50 mg Oral QHS    Dialysis Orders: MWF - [facility] 3:30hr, 400/A1.5, EDW 67.3kg, 2K/2.5Ca, UFP #2, AVF, no heparin  - No ESA - Calcitriol  0.75mcg PO q HD - Sensipar 30mg  PO q HD  Assessment/Plan: CAD/Unstable angina. Cardiology following, LHC 6/23 with severe multivessel disease. CTS consulted, poor candidate for surgery. Underwent cardiac cath 6/25 with stents to LAD.  Post  procedure groin hematoma. Per cardiology.   Hx VT/VF.  Unstable recently. ICD in place. Cardiology pursuing mexilitine this admit. ESRD:  HD MWF.   Had HD Tues, held Thurs, had HD Fri.  No added heparin  with HD. Next HD 6/30. Hypotension/volume: BP soft post procedure. Off Coreg . Midodrine  added TID.  N Anemia: Hgb 10.9 >8.8.  Will give ESA here --Received Aranesp  40 on 6/26.    Metabolic bone disease: Phos above goal, usually controlled, not on binder as outpatient.   T2DM  HFrEF, severe CAD  COPD  Charmaine Piety, NP [facility] 10/20/2023,3:11 PM  LOS: 6 days

## 2023-10-20 NOTE — Plan of Care (Signed)

## 2023-10-20 NOTE — Progress Notes (Signed)
 Patient transferred to 2C01 with all belongings.  Appears awake and alert in no distress.

## 2023-10-20 NOTE — Progress Notes (Addendum)
 PHARMACY - PHYSICIAN COMMUNICATION CRITICAL VALUE ALERT - BLOOD CULTURE IDENTIFICATION (BCID)  James Chan is an 74 y.o. male who presented to [facility] on 10/14/2023 with a chief complaint of chest pain. Had a DES LAD placed, was declined by CT surgery due to high risk. Known HFrEF. Patient iHD- last session 6/27. Normally MWF.   Assessment:  3/4 bottles MSSA, no known source   Name of physician (or Provider) Contacted: Spoke with on call cardiologist provider    Current antibiotics: None   Changes to prescribed antibiotics recommended:  Start ancef  1g q24h due to being off HD schedule and currently off antibiotics- consider moving to HD days once normalized.   Results for orders placed or performed during the hospital encounter of 10/14/23  Blood Culture ID Panel (Reflexed) (Collected: 10/19/2023 10:32 PM)  Result Value Ref Range   Enterococcus faecalis NOT DETECTED NOT DETECTED   Enterococcus Faecium NOT DETECTED NOT DETECTED   Listeria monocytogenes NOT DETECTED NOT DETECTED   Staphylococcus species DETECTED (A) NOT DETECTED   Staphylococcus aureus (BCID) (A) NOT DETECTED    CRITICAL RESULT CALLED TO, READ BACK BY AND VERIFIED WITH:   Staphylococcus epidermidis NOT DETECTED NOT DETECTED   Staphylococcus lugdunensis NOT DETECTED NOT DETECTED   Streptococcus species NOT DETECTED NOT DETECTED   Streptococcus agalactiae NOT DETECTED NOT DETECTED   Streptococcus pneumoniae NOT DETECTED NOT DETECTED   Streptococcus pyogenes NOT DETECTED NOT DETECTED   A.calcoaceticus-baumannii NOT DETECTED NOT DETECTED   Bacteroides fragilis NOT DETECTED NOT DETECTED   Enterobacterales NOT DETECTED NOT DETECTED   Enterobacter cloacae complex NOT DETECTED NOT DETECTED   Escherichia coli NOT DETECTED NOT DETECTED   Klebsiella aerogenes NOT DETECTED NOT DETECTED   Klebsiella oxytoca NOT DETECTED NOT DETECTED   Klebsiella pneumoniae NOT DETECTED NOT DETECTED   Proteus species NOT DETECTED NOT  DETECTED   Salmonella species NOT DETECTED NOT DETECTED   Serratia marcescens NOT DETECTED NOT DETECTED   Haemophilus influenzae NOT DETECTED NOT DETECTED   Neisseria meningitidis NOT DETECTED NOT DETECTED   Pseudomonas aeruginosa NOT DETECTED NOT DETECTED   Stenotrophomonas maltophilia NOT DETECTED NOT DETECTED   Candida albicans NOT DETECTED NOT DETECTED   Candida auris NOT DETECTED NOT DETECTED   Candida glabrata NOT DETECTED NOT DETECTED   Candida krusei NOT DETECTED NOT DETECTED   Candida parapsilosis NOT DETECTED NOT DETECTED   Candida tropicalis NOT DETECTED NOT DETECTED   Cryptococcus neoformans/gattii NOT DETECTED NOT DETECTED   Meth resistant mecA/C and MREJ NOT DETECTED NOT DETECTED    Powell Blush, PharmD, BCCCP  10/20/2023  12:54 PM

## 2023-10-20 NOTE — Progress Notes (Signed)
  Progress Note  Patient Name: James Chan Date of Encounter: 10/20/2023 [facility] Cardiologist: Redell Leiter, MD   Interval Summary   Feeling weak and fatigued.  Otherwise no acute complaints  Vital Signs Vitals:   10/20/23 0213 10/20/23 0407 10/20/23 0550 10/20/23 0737  BP: (!) 116/45 (!) 106/52 (!) 104/42 (!) 129/56  Pulse: (!) 52 (!) 56 (!) 56 (!) 57  Resp: (!) 21 19 17 18   Temp:  97.6 F (36.4 C)  97.6 F (36.4 C)  TempSrc:  Oral  Oral  SpO2: 93% 96% 95% 95%  Weight:      Height:        Intake/Output Summary (Last 24 hours) at 10/20/2023 0832 Last data filed at 10/20/2023 0400 Gross per 24 hour  Intake 360 ml  Output 2050 ml  Net -1690 ml      10/19/2023    7:04 PM 10/19/2023    3:30 PM 10/17/2023    5:00 AM  Last 3 Weights  Weight (lbs) 151 lb 0.2 oz 155 lb 13.8 oz 153 lb 12.8 oz  Weight (kg) 68.5 kg 70.7 kg 69.763 kg      Telemetry/ECG  Sinus rhythm- Personally Reviewed  Physical Exam  GEN: No acute distress.   Neck: No JVD Cardiac: RRR, no murmurs, rubs, or gallops.  Respiratory: Clear to auscultation bilaterally. GI: Soft, nontender, non-distended  MS: No edema  Assessment & Plan   1.  Coronary artery disease with unstable angina: Severe LAD stenosis post high risk stenting.  On aspirin , Brilinta , statin.  No current chest pain.  Jesyca Weisenburger continue with current management.  2.  Anemia: Hematoma post catheterization with drop in hemoglobin.  Stable over the last few days.  Continue monitoring.  3.  End-stage renal disease: On dialysis per nephrology  4.  Hyponatremia: Sodium 126.  Plan per nephrology  5.  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  Continue amiodarone  and mexiletine  7.  Deconditioning: Lesley Galentine need aggressive physical therapy prior to discharge.     For questions or updates, please contact [facility] Please consult [email or web address] for contact info under       Signed, Casey Maxfield Gladis Norton, MD

## 2023-10-20 NOTE — Progress Notes (Signed)
 CARDIAC REHAB PHASE I   PRE:  Rate/Rhythm: 60 SR w PVCs  BP:  Sitting: 111/41      SpO2: 95 RA  MODE:  Ambulation: 200 ft    POST:  Rate/Rhythm: 64 SR  BP:  Sitting: 104/50      SpO2: 92 RA  Pt amb with contact guard assist using gait belt and RW in the hallway. Upon standing from bed, pt experienced mild dizziness that resolved after waiting a short time. Pt walked at a slow pace around the unit until we returned to his room and was helped to bed. Pt educated on HF, PCI restrictions, HH diet, and exercise guidelines. I also recommended CR to be completed at [facility]. Will send referral there. All questions answered prior to leaving.   Garen FORBES Candy  MS, ACSM-CEP 9:00 AM 10/20/2023    Service time is from 0830 to 0900.

## 2023-10-20 NOTE — Progress Notes (Addendum)
 Last night PA had reported foul-smelling discharge coming from IV site. Blood cultures today are growing MSSA bacteria. Called pharmacy, starting Ancef . Still waiting on sensitivities. I will also page hospitalist for further workup.   Echocardiogram performed 10/16/2023 demonstrating no signs of vegetation.  With an EF of less than 20% need to closely monitor clinical status.  Would have very low threshold to transfer back to the ICU.  No labs today ordering CBC with diff and CMP

## 2023-10-20 NOTE — Evaluation (Signed)
 Physical Therapy Evaluation Patient Details Name: James Chan MRN: [id number] DOB: [birth year] Today's Date: 10/20/2023  History of Present Illness  Pt is a 73 y/o male admitted 6/22 via EMS with recurrent CP starting 2 days prior to admission requiring nitroglycerin  use.  Pt s/p L heart Cath 6/25, developed a large groin hematoma which has been addressed.  PMH  Vfib, AAA, ASHD, HTN, cardiac arrest CHF, DM, ESRDGERD, Glaucoma, ICD implantation, NSTEMI, PAD, sleep apnea, stroke, USA , R/L knee sx  Clinical Impression  Pt admitted with/for heart cath with complication of groin hematoma.  Pt expresses that he is not at his baseline stability, needing CGA overall .  Pt currently limited functionally due to the problems listed below.  (see problems list.)  Pt will benefit from PT to maximize function and safety to be able to get home safely with available assist of his wife.         If plan is discharge home, recommend the following: Help with stairs or ramp for entrance;Assistance with cooking/housework;A little help with walking and/or transfers;A lot of help with bathing/dressing/bathroom   Can travel by private vehicle        Equipment Recommendations Other (comment) (RW,  TBD further.)  Recommendations for Other Services       Functional Status Assessment Patient has had a recent decline in their functional status and demonstrates the ability to make significant improvements in function in a reasonable and predictable amount of time.     Precautions / Restrictions Precautions Precautions: Fall Recall of Precautions/Restrictions: Intact      Mobility  Bed Mobility Overal bed mobility: Needs Assistance, Modified Independent Bed Mobility: Sidelying to Sit, Sit to Supine   Sidelying to sit: Supervision   Sit to supine: Contact guard assist   General bed mobility comments: more difficulty and time to get back into bed.    Transfers Overall transfer level: Needs  assistance   Transfers: Sit to/from Stand Sit to Stand: Contact guard assist           General transfer comment: need multiple rocking attempst to come up and stay forward enough to need only guarding.    Ambulation/Gait Ambulation/Gait assistance: Contact guard assist Gait Distance (Feet): 200 Feet Assistive device: Rolling walker (2 wheels) Gait Pattern/deviations: Step-through pattern   Gait velocity interpretation: <1.8 ft/sec, indicate of risk for recurrent falls   General Gait Details: unsteady gait, improving with time up in the RW until fatigue.  slower cadence, cues for safer proximity to the RW, cues for better posture.  Stairs            Wheelchair Mobility     Tilt Bed    Modified Rankin (Stroke Patients Only)       Balance Overall balance assessment: Needs assistance Sitting-balance support: No upper extremity supported, Feet supported Sitting balance-Leahy Scale: Fair     Standing balance support: Single extremity supported, Bilateral upper extremity supported, During functional activity Standing balance-Leahy Scale: Poor (to fair) Standing balance comment:  (to fair)                             Pertinent Vitals/Pain Pain Assessment Pain Assessment: Faces Faces Pain Scale: No hurt Pain Intervention(s): Monitored during session    Home Living Family/patient expects to be discharged to:: Private residence Living Arrangements: Spouse/significant other Available Help at Discharge: Family;Available 24 hours/day Type of Home: House Home Access: Stairs to enter Entrance Stairs-Rails: None Entrance  Stairs-Number of Steps: 3   Home Layout: One level Home Equipment: Cane - single point      Prior Function Prior Level of Function : Independent/Modified Independent;Other (comment) (in both mobility and all ADL's)                     Extremity/Trunk Assessment   Upper Extremity Assessment Upper Extremity Assessment:  Overall WFL for tasks assessed    Lower Extremity Assessment Lower Extremity Assessment: Overall WFL for tasks assessed    Cervical / Trunk Assessment Cervical / Trunk Assessment: Normal  Communication   Communication Communication: No apparent difficulties    Cognition Arousal: Alert Behavior During Therapy: WFL for tasks assessed/performed   PT - Cognitive impairments: No apparent impairments                         Following commands: Intact       Cueing Cueing Techniques: Verbal cues     General Comments General comments (skin integrity, edema, etc.): vss on RA satisfactory,  HR 63+ bpm and SpO2 94/95%    Exercises     Assessment/Plan    PT Assessment Patient needs continued PT services  PT Problem List Decreased activity tolerance;Decreased balance;Decreased mobility;Cardiopulmonary status limiting activity       PT Treatment Interventions DME instruction;Gait training;Stair training;Functional mobility training;Therapeutic activities;Patient/family education    PT Goals (Current goals can be found in the Care Plan section)  Acute Rehab PT Goals Patient Stated Goal: get home soon PT Goal Formulation: With patient Time For Goal Achievement: 11/02/23 Potential to Achieve Goals: Good    Frequency Min 2X/week     Co-evaluation               AM-PAC PT 6 Clicks Mobility  Outcome Measure Help needed turning from your back to your side while in a flat bed without using bedrails?: A Little Help needed moving from lying on your back to sitting on the side of a flat bed without using bedrails?: A Little Help needed moving to and from a bed to a chair (including a wheelchair)?: A Little Help needed standing up from a chair using your arms (e.g., wheelchair or bedside chair)?: A Little Help needed to walk in hospital room?: A Little Help needed climbing 3-5 steps with a railing? : A Little 6 Click Score: 18    End of Session   Activity  Tolerance: Patient tolerated treatment well Patient left: with call bell/phone within reach;in bed Nurse Communication: Mobility status PT Visit Diagnosis: Unsteadiness on feet (R26.81);Other abnormalities of gait and mobility (R26.89)    Time: 8766-8744 PT Time Calculation (min) (ACUTE ONLY): 22 min   Charges:   PT Evaluation $PT Eval Moderate Complexity: 1 Mod   PT General Charges $$ ACUTE PT VISIT: 1 Visit         10/20/2023  India HERO., PT Acute Rehabilitation Services [phone]  (office)  Vinie GAILS Laytoya Ion 10/20/2023, 1:19 PM

## 2023-10-20 NOTE — Consult Note (Signed)
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 end-stage renal disease.  Per nephrology.  >>Anemia: Suspect secondary to end-stage renal disease, will monitor type and screen transfuse as deemed appropriate.   >>DM II: Pt states he used to be a diabetic. Will start HD glycemic protocol.    Thank you for this consultation.  We will follow the patient with you.  Time Spent on Consult: 40 minutes  Mario LULLA Blanch 10/20/2023, 6:57 PM

## 2023-10-21 DIAGNOSIS — R7881 Bacteremia: Secondary | ICD-10-CM | POA: Diagnosis not present

## 2023-10-21 DIAGNOSIS — D649 Anemia, unspecified: Secondary | ICD-10-CM | POA: Diagnosis not present

## 2023-10-21 DIAGNOSIS — I472 Ventricular tachycardia, unspecified: Secondary | ICD-10-CM | POA: Diagnosis not present

## 2023-10-21 DIAGNOSIS — B9561 Methicillin susceptible Staphylococcus aureus infection as the cause of diseases classified elsewhere: Secondary | ICD-10-CM

## 2023-10-21 DIAGNOSIS — I25110 Atherosclerotic heart disease of native coronary artery with unstable angina pectoris: Secondary | ICD-10-CM | POA: Diagnosis not present

## 2023-10-21 LAB — CBC WITH DIFFERENTIAL/PLATELET
Abs Immature Granulocytes: 0.15 10*3/uL — ABNORMAL HIGH (ref 0.00–0.07)
Basophils Absolute: 0 10*3/uL (ref 0.0–0.1)
Basophils Relative: 0 %
Eosinophils Absolute: 0.1 10*3/uL (ref 0.0–0.5)
Eosinophils Relative: 1 %
HCT: 25 % — ABNORMAL LOW (ref 39.0–52.0)
Hemoglobin: 8.6 g/dL — ABNORMAL LOW (ref 13.0–17.0)
Immature Granulocytes: 1 %
Lymphocytes Relative: 4 %
Lymphs Abs: 0.5 10*3/uL — ABNORMAL LOW (ref 0.7–4.0)
MCH: 33.5 pg (ref 26.0–34.0)
MCHC: 34.4 g/dL (ref 30.0–36.0)
MCV: 97.3 fL (ref 80.0–100.0)
Monocytes Absolute: 1.1 10*3/uL — ABNORMAL HIGH (ref 0.1–1.0)
Monocytes Relative: 8 %
Neutro Abs: 10.9 10*3/uL — ABNORMAL HIGH (ref 1.7–7.7)
Neutrophils Relative %: 86 %
Platelets: 198 10*3/uL (ref 150–400)
RBC: 2.57 MIL/uL — ABNORMAL LOW (ref 4.22–5.81)
RDW: 14.8 % (ref 11.5–15.5)
WBC: 12.8 10*3/uL — ABNORMAL HIGH (ref 4.0–10.5)
nRBC: 0.2 % (ref 0.0–0.2)

## 2023-10-21 LAB — COMPREHENSIVE METABOLIC PANEL WITH GFR
ALT: 95 U/L — ABNORMAL HIGH (ref 0–44)
AST: 74 U/L — ABNORMAL HIGH (ref 15–41)
Albumin: 2.7 g/dL — ABNORMAL LOW (ref 3.5–5.0)
Alkaline Phosphatase: 85 U/L (ref 38–126)
Anion gap: 9 (ref 5–15)
BUN: 46 mg/dL — ABNORMAL HIGH (ref 8–23)
CO2: 27 mmol/L (ref 22–32)
Calcium: 8.6 mg/dL — ABNORMAL LOW (ref 8.9–10.3)
Chloride: 90 mmol/L — ABNORMAL LOW (ref 98–111)
Creatinine, Ser: 5.66 mg/dL — ABNORMAL HIGH (ref 0.61–1.24)
GFR, Estimated: 10 mL/min — ABNORMAL LOW (ref >60)
Glucose, Bld: 95 mg/dL (ref 70–99)
Potassium: 4.3 mmol/L (ref 3.5–5.1)
Sodium: 126 mmol/L — ABNORMAL LOW (ref 135–145)
Total Bilirubin: 1.4 mg/dL — ABNORMAL HIGH (ref 0.0–1.2)
Total Protein: 6.1 g/dL — ABNORMAL LOW (ref 6.5–8.1)

## 2023-10-21 LAB — GLUCOSE, CAPILLARY
Glucose-Capillary: 104 mg/dL — ABNORMAL HIGH (ref 70–99)
Glucose-Capillary: 111 mg/dL — ABNORMAL HIGH (ref 70–99)
Glucose-Capillary: 97 mg/dL (ref 70–99)
Glucose-Capillary: 90 mg/dL (ref 70–99)

## 2023-10-21 MED ORDER — CHLORHEXIDINE GLUCONATE CLOTH 2 % EX PADS
6.0000 | MEDICATED_PAD | Freq: Every day | CUTANEOUS | Status: DC
  Administered 2023-10-22 – 2023-10-24 (×3): 6 via TOPICAL

## 2023-10-21 MED ORDER — CEFAZOLIN SODIUM 1 G IM
1.0000 g | Freq: Once | INTRAMUSCULAR | Status: AC
  Administered 2023-10-21: 1 g via INTRAMUSCULAR
  Filled 2023-10-21: qty 1000

## 2023-10-21 MED ORDER — MIDODRINE HCL 5 MG PO TABS: 10.0000 mg | ORAL_TABLET | Freq: Three times a day (TID) | ORAL | Status: DC

## 2023-10-21 MED ORDER — MIDODRINE HCL 5 MG PO TABS
15.0000 mg | ORAL_TABLET | ORAL | Status: DC
  Administered 2023-10-22 – 2023-10-24 (×2): 15 mg via ORAL
  Filled 2023-10-21 (×2): qty 3

## 2023-10-21 MED ORDER — CEFAZOLIN SODIUM-DEXTROSE 2-4 GM/100ML-% IV SOLN: 2.0000 g | INTRAVENOUS | Status: DC

## 2023-10-21 NOTE — Progress Notes (Signed)
 PHARMACY NOTE:  ANTIMICROBIAL RENAL DOSAGE ADJUSTMENT  Current antimicrobial regimen includes a mismatch between antimicrobial dosage and estimated renal function.  As per policy approved by the Pharmacy & Therapeutics and Medical Executive Committees, the antimicrobial dosage will be adjusted accordingly.  Current antimicrobial dosage:  Ancef  1gm IV q24h  Indication: Bacteremia  Renal Function:  Estimated Creatinine Clearance: 11 mL/min (A) (by C-G formula based on SCr of 5.66 mg/dL (H)). [x]      On intermittent HD, scheduled: []      On CRRT    Antimicrobial dosage has been changed to: Ancef   2gm IV post HD MWF  Additional comments:   Thank you for allowing pharmacy to be a part of this patient's care.   Prentice Poisson, PharmD Clinical Pharmacist **Pharmacist phone directory can now be found on [email or web address] ([email or web address]).  Listed under [facility].

## 2023-10-21 NOTE — Progress Notes (Signed)
 [facility] Progress Note   Subjective:    Seen and examined patient at bedside. Working with PT. No acute complaints. Next HD 6/30.  Objective Vitals:   10/21/23 0327 10/21/23 0733 10/21/23 1249 10/21/23 1613  BP: (!) 128/48 (!) 151/61 113/61 (!) 113/90  Pulse: (!) 56 60 (!) 55 (!) 55  Resp: 16 19 19 19   Temp: 97.6 F (36.4 C) 97.6 F (36.4 C) 97.7 F (36.5 C) 97.9 F (36.6 C)  TempSrc: Oral Oral Oral Oral  SpO2: 96% 95% 96% 95%  Weight:      Height:       Physical Exam General: Sitting in recliner, nad Heart: RRR Lungs: Clear Abdomen: soft, non tender Extremities: no LE edema Dialysis Access: R AVF +T/B  Filed Weights   10/17/23 0500 10/19/23 1530 10/19/23 1904  Weight: 69.8 kg 70.7 kg 68.5 kg   No intake or output data in the 24 hours ending 10/21/23 1741  Additional Objective Labs: Basic Metabolic Panel: Recent Labs  Lab 10/17/23 0418 10/17/23 1552 10/19/23 0218 10/20/23 1633 10/21/23 0311  NA 128*   < > 126* 128* 126*  K 5.1   < > 5.7* 4.4 4.3  CL 94*   < > 89* 90* 90*  CO2 22   < > 22 23 27   GLUCOSE 90   < > 148* 110* 95  BUN 39*   < > 64* 44* 46*  CREATININE 4.00*   < > 6.05* 5.06* 5.66*  CALCIUM  8.7*   < > 9.1 8.8* 8.6*  PHOS 4.8*  --  7.0*  --   --    < > = values in this interval not displayed.   Liver Function Tests: Recent Labs  Lab 10/19/23 0218 10/20/23 1633 10/21/23 0311  AST  --  96* 74*  ALT  --  111* 95*  ALKPHOS  --  78 85  BILITOT  --  1.3* 1.4*  PROT  --  6.0* 6.1*  ALBUMIN 3.0* 2.7* 2.7*   No results for input(s): LIPASE, AMYLASE in the last 168 hours. CBC: Recent Labs  Lab 10/18/23 1457 10/19/23 0218 10/19/23 2235 10/20/23 1633 10/21/23 0311  WBC 11.5* 12.9* 16.4* 14.2* 12.8*  NEUTROABS  --   --  14.5* 12.0* 10.9*  HGB 8.8* 9.1* 9.1* 9.3* 8.6*  HCT 25.6* 26.6* 26.3* 27.1* 25.0*  MCV 96.6 97.1 97.0 98.5 97.3  PLT 165 174 192 165 198   Blood Culture    Component Value Date/Time   SDES  BLOOD LEFT HAND 10/19/2023 2234   SPECREQUEST  10/19/2023 2234    BOTTLES DRAWN AEROBIC AND ANAEROBIC Blood Culture adequate volume   CULT  10/19/2023 2234    CULTURE REINCUBATED FOR BETTER GROWTH Performed at [facility] Lab, [address], [place], [place] [address]    REPTSTATUS PENDING 10/19/2023 2234    Cardiac Enzymes: No results for input(s): CKTOTAL, CKMB, CKMBINDEX, TROPONINI in the last 168 hours. CBG: Recent Labs  Lab 10/17/23 2026 10/21/23 0603 10/21/23 1130 10/21/23 1612  GLUCAP 118* 104* 111* 97   Iron Studies: No results for input(s): IRON, TIBC, TRANSFERRIN, FERRITIN in the last 72 hours. Lab Results  Component Value Date   INR 1.1 01/14/2020   Studies/Results: No results found.  Medications:  anticoagulant sodium citrate      [START ON 10/22/2023]  ceFAZolin  (ANCEF ) IV      allopurinol   100 mg Oral q1800   amiodarone   200 mg Oral Daily   aspirin  EC  81 mg Oral Daily   atorvastatin   80 mg Oral Daily   ceFAZolin  (ANCEF ) IM  1 g Intramuscular Once   Chlorhexidine  Gluconate Cloth  6 each Topical Q0600   Chlorhexidine  Gluconate Cloth  6 each Topical Q0600   heparin   5,000 Units Subcutaneous Q8H   insulin  aspart  0-6 Units Subcutaneous TID WC   latanoprost   1 drop Both Eyes QHS   mexiletine  250 mg Oral BID   [START ON 10/22/2023] midodrine   15 mg Oral Q M,W,F   pyridoxine   100 mg Oral Q1500   sodium chloride  flush  3 mL Intravenous Q12H   sodium chloride  flush  3 mL Intravenous Q12H   ticagrelor   90 mg Oral BID   torsemide   100 mg Oral Once per day on Sunday Tuesday Thursday Saturday   traZODone   50 mg Oral QHS    Dialysis Orders: MWF - [facility] 3:30hr, 400/A1.5, EDW 67.3kg, 2K/2.5Ca, UFP #2, AVF, no heparin  - No ESA - Calcitriol  0.75mcg PO q HD - Sensipar 30mg  PO q HD  Assessment/Plan: CAD/Unstable angina. Cardiology following, LHC 6/23 with severe multivessel disease. CTS consulted, poor candidate for surgery. Underwent cardiac  cath 6/25 with stents to LAD.  Post procedure groin hematoma. Per cardiology.   Hx VT/VF.  Unstable recently. ICD in place. Cardiology pursuing mexilitine this admit. ESRD:  HD MWF.   Had HD 6/24, held 6/26, had HD 6/27.  No added heparin  with HD. Next HD 6/30. Hypotension/volume: BP soft post procedure. Off Coreg . Midodrine  added TID.  N Anemia: Hgb 10.9 >8.8.  Will give ESA here --Received Aranesp  40 on 6/26.    Metabolic bone disease: Phos above goal, usually controlled, not on binder as outpatient.   T2DM  HFrEF, severe CAD  COPD  Charmaine Piety, NP [facility] 10/21/2023,5:41 PM  LOS: 7 days

## 2023-10-21 NOTE — Progress Notes (Signed)
 A consult was placed to IV Therapy for new IV access; pt is limited to the Left arm only; left anterior forearm noted to be sore and warm to touch; pt has a small area near the left ac with old, dry drainage. Pt stated he had an iv there.  Also noted, is an area above the Left AC, inner aspect of his arm, where it is also sore from another IV. Skin is very red;  both these areas on the left arm have been marked previously with a pen to monitor places of redness and tenderness.  Pt was assessed with ultrasound, and the only healthy vein was in the Left forearm, near the warm, tender area. No attempts made to restart the iv at this time.  Have spoken with the RN and sent secure chats;  RN will re-consult IV Team if needed.

## 2023-10-21 NOTE — Plan of Care (Signed)
  Problem: Activity: Goal: Ability to tolerate increased activity will improve Outcome: Progressing   Problem: Education: Goal: Knowledge of General Education information will improve Description: Including pain rating scale, medication(s)/side effects and non-pharmacologic comfort measures Outcome: Progressing   Problem: Clinical Measurements: Goal: Respiratory complications will improve Outcome: Progressing   Problem: Nutrition: Goal: Adequate nutrition will be maintained Outcome: Progressing   Problem: Pain Managment: Goal: General experience of comfort will improve and/or be controlled Outcome: Progressing

## 2023-10-21 NOTE — Progress Notes (Signed)
 Mobility Specialist Progress Note;   10/21/23 1015  Mobility  Activity Ambulated with assistance in hallway;Transferred from bed to chair  Level of Assistance Standby assist, set-up cues, supervision of patient - no hands on  Assistive Device Front wheel walker  Distance Ambulated (ft) 200 ft  Activity Response Tolerated well  Mobility Referral Yes  Mobility visit 1 Mobility  Mobility Specialist Start Time (ACUTE ONLY) 1015  Mobility Specialist Stop Time (ACUTE ONLY) 1031  Mobility Specialist Time Calculation (min) (ACUTE ONLY) 16 min   Pt agreeable to mobility. Required no physical assistance during ambulation, SV. VC required for navigation and safety. VSS throughout and no c/o when asked. Transferred pt to chair at [facility]. Pt left with all needs met, call bell in reach. RN notified.   Lauraine Erm Mobility Specialist Please contact via SecureChat or [employer] [phone]

## 2023-10-21 NOTE — Progress Notes (Signed)
 [facility] PROGRESS NOTE   James Chan FMW:[id number] DOB: February 06, 1951 DOA: 10/14/2023  PCP: [name], MD  Brief History: 73 y.o. 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 Inocencio on 10/08/2023 with recurrent ICD discharge due to recurrent VT VF requiring 6 shocks on 10/05/2023, came into the hospital for his chest pain and syncope episode.  Admitted for unstable angina.  Underwent high risk stenting of severe LAD stenosis.    Subjective/Interval History: Patient denies any chest pain or shortness of breath this morning.  Denies any pain in the left antecubital area.    Assessment/Plan:  Staph bacteremia in the setting of peripheral IV infection No significant warmth is noted in the antecubital area of the left upper extremity.  Does have some erythema. Patient currently noted to be on cefazolin . Staph bacteremia should have triggered ID consultation. Defer further management of this issue to infectious disease.  Coronary artery disease/chronic systolic CHF Managed by cardiology.  Underwent high risk stenting of the LAD during this hospitalization. Based on echocardiogram LVEF is less than 20%.  End-stage renal disease on hemodialysis Nephrology is managing.  Hyponatremia Normocytic anemia Leukocytosis Diabetes mellitus type 2       Medications: Scheduled:  allopurinol   100 mg Oral q1800   amiodarone   200 mg Oral Daily   aspirin  EC  81 mg Oral Daily   atorvastatin   80 mg Oral Daily    Chlorhexidine  Gluconate Cloth  6 each Topical Q0600   Chlorhexidine  Gluconate Cloth  6 each Topical Q0600   heparin   5,000 Units Subcutaneous Q8H   insulin  aspart  0-6 Units Subcutaneous TID WC   latanoprost   1 drop Both Eyes QHS   mexiletine  250 mg Oral BID   [START ON 10/22/2023] midodrine   15 mg Oral Q M,W,F   pyridoxine   100 mg Oral Q1500   sodium chloride  flush  3 mL Intravenous Q12H   sodium chloride  flush  3 mL Intravenous Q12H   ticagrelor   90 mg Oral BID   torsemide   100 mg Oral Once per day on Sunday Tuesday Thursday Saturday   traZODone  50 mg Oral QHS   Continuous:  anticoagulant sodium citrate      ceFAZolin (ANCEF) IV 1 g (10/20/23 1613)   [START ON 10/22/2023]  ceFAZolin (ANCEF) IV     PRN:acetaminophen, albuterol, alteplase, anticoagulant sodium citrate, feeding supplement (NEPRO CARB STEADY), heparin, lidocaine (PF), lidocaine-prilocaine, nitroGLYCERIN, ondansetron (ZOFRAN) IV, oxyCODONE-acetaminophen, pentafluoroprop-tetrafluoroeth, sodium chloride flush, sodium chloride flush  Antibiotics: Anti-infectives (From admission, onward)    Start     Dose/Rate Route Frequency Ordered Stop   10/22/23 2000  ceFAZolin (ANCEF) IVPB 2g/100 mL premix        2 g 200 mL/hr over 30 Minutes Intravenous Every M-W-F (2000) 10/21/23 0935     06 /28/25 1400  ceFAZolin  (ANCEF ) IVPB 1 g/50 mL premix        1 g 100 mL/hr over 30 Minutes Intravenous Every 24 hours 10/20/23 1301 10/21/23 2359       Objective:  Vital Signs  Vitals:   10/20/23 2239 10/20/23 2300 10/21/23  0327 10/21/23 0733  BP: (!) 110/56 (!) 127/57 (!) 128/48 (!) 151/61  Pulse: (!) 59 (!) 52 (!) 56 60  Resp: (!) 24 20 16 19   Temp: 97.9 F (36.6 C)  97.6 F (36.4 C) 97.6 F (36.4 C)  TempSrc: Oral  Oral Oral  SpO2: 97% 96% 96% 95%  Weight:      Height:        Intake/Output Summary (Last 24 hours) at 10/21/2023 1135 Last data filed at 10/20/2023 1634 Gross per 24 hour  Intake 149.22 ml  Output --  Net  149.22 ml   Filed Weights   10/17/23 0500 10/19/23 1530 10/19/23 1904  Weight: 69.8 kg 70.7 kg 68.5 kg    General appearance: Awake alert.  In no distress Resp: Clear to auscultation bilaterally.  Normal effort Cardio: S1-S2 is normal regular.  No S3-S4.  No rubs murmurs or bruit GI: Abdomen is soft.  Nontender nondistended.  Bowel sounds are present normal.  No masses organomegaly Extremities: No edema.  Full range of motion of lower extremities. Neurologic: Alert and oriented x3.  No focal neurological deficits.    Lab Results:  Data Reviewed: I have personally reviewed following labs and reports of the imaging studies  CBC: Recent Labs  Lab 10/18/23 0217 10/18/23 1457 10/19/23 0218 10/19/23 2235 10/20/23 1633 10/21/23 0311  WBC 12.6* 11.5* 12.9* 16.4* 14.2* 12.8*  NEUTROABS 11.9*  --   --  14.5* 12.0* 10.9*  HGB 8.8* 8.8* 9.1* 9.1* 9.3* 8.6*  HCT 26.4* 25.6* 26.6* 26.3* 27.1* 25.0*  MCV 98.9 96.6 97.1 97.0 98.5 97.3  PLT 147* 165 174 192 165 198    Basic Metabolic Panel: Recent Labs  Lab 10/17/23 0418 10/17/23 1552 10/17/23 2120 10/18/23 0739 10/19/23 0218 10/20/23 1633 10/21/23 0311  NA 128*   < > 127* 126* 126* 128* 126*  K 5.1   < > 4.9 4.7 5.7* 4.4 4.3  CL 94*  --   --  91* 89* 90* 90*  CO2 22  --   --  18* 22 23 27   GLUCOSE 90  --   --  187* 148* 110* 95  BUN 39*  --   --  54* 64* 44* 46*  CREATININE 4.00*  --   --  5.11* 6.05* 5.06* 5.66*  CALCIUM  8.7*  --   --  8.9 9.1 8.8* 8.6*  PHOS 4.8*  --   --   --  7.0*  --   --    < > = values in this interval not displayed.    GFR: Estimated Creatinine Clearance: 11 mL/min (A) (by C-G formula based on SCr of 5.66 mg/dL (H)).  Liver Function Tests: Recent Labs  Lab 10/17/23 0418 10/19/23 0218 10/20/23 1633 10/21/23 0311  AST  --   --  96* 74*  ALT  --   --  111* 95*  ALKPHOS  --   --  78 85  BILITOT  --   --  1.3* 1.4*  PROT  --   --  6.0* 6.1*  ALBUMIN 3.2* 3.0* 2.7* 2.7*    HbA1C: Recent  Labs    10/20/23 1945  HGBA1C 4.6*    CBG: Recent Labs  Lab 10/17/23 2026 10/21/23 0603  GLUCAP 118* 104*    Recent Results (from the past 240 hours)  MRSA Next Gen by PCR, Nasal     Status: None   Collection Time: 10/17/23  8:13 PM   Specimen: Nasal Mucosa; Nasal Swab  Result Value Ref Range Status   MRSA by PCR Next Gen NOT DETECTED NOT DETECTED Final    Comment: (NOTE) The GeneXpert MRSA Assay (FDA approved for NASAL specimens only), is one component of a comprehensive MRSA colonization surveillance program. It is not intended to diagnose MRSA infection nor to guide or monitor treatment for MRSA infections. Test performance is not FDA approved in patients less than 82 years old. Performed at [facility], [address], [place], [place] [address]   Culture, blood (Routine X 2) w Reflex to ID Panel     Status: None (Preliminary result)   Collection Time: 10/19/23 10:32 PM   Specimen: BLOOD LEFT HAND  Result Value Ref Range Status   Specimen Description BLOOD LEFT HAND  Final   Special Requests   Final    BOTTLES DRAWN AEROBIC AND ANAEROBIC Blood Culture results may not be optimal due to an inadequate volume of blood received in culture bottles   Culture  Setup Time   Final    GRAM POSITIVE COCCI AEROBIC BOTTLE ONLY CRITICAL RESULT CALLED TO, READ BACK BY AND VERIFIED WITH: PHARMD HEATHER TANDA [id number] AT 1240 BY EC    Culture   Final    GRAM POSITIVE COCCI CULTURE REINCUBATED FOR BETTER GROWTH Performed at [facility], [address] [place], [place] [address]    Report Status PENDING  Incomplete  Blood Culture ID Panel (Reflexed)     Status: Abnormal   Collection Time: 10/19/23 10:32 PM  Result Value Ref Range Status   Enterococcus faecalis NOT DETECTED NOT DETECTED Final   Enterococcus Faecium NOT DETECTED NOT DETECTED Final   Listeria monocytogenes NOT DETECTED NOT DETECTED Final   Staphylococcus species DETECTED (A) NOT DETECTED Final     Comment: CRITICAL RESULT CALLED TO, READ BACK BY AND VERIFIED WITH: PHARMD HEATHER WILSON [id number] AT 1240 BY EC    Staphylococcus aureus (BCID) (A) NOT DETECTED Final    CRITICAL RESULT CALLED TO, READ BACK BY AND VERIFIED WITH:    Comment: PHARMD HEATHER WILSON [id number] BY EC   Staphylococcus epidermidis NOT DETECTED NOT DETECTED Final   Staphylococcus lugdunensis NOT DETECTED NOT DETECTED Final   Streptococcus species NOT DETECTED NOT DETECTED Final   Streptococcus agalactiae NOT DETECTED NOT DETECTED Final   Streptococcus pneumoniae NOT DETECTED NOT DETECTED Final   Streptococcus pyogenes NOT DETECTED NOT DETECTED Final   A.calcoaceticus-baumannii NOT DETECTED NOT DETECTED Final   Bacteroides fragilis NOT DETECTED NOT DETECTED Final   Enterobacterales NOT DETECTED NOT DETECTED Final   Enterobacter cloacae complex NOT DETECTED NOT DETECTED Final   Escherichia coli NOT DETECTED NOT DETECTED Final   Klebsiella aerogenes NOT DETECTED NOT DETECTED Final   Klebsiella oxytoca NOT DETECTED NOT DETECTED Final   Klebsiella pneumoniae NOT DETECTED NOT DETECTED Final   Proteus species NOT DETECTED NOT DETECTED Final   Salmonella species NOT DETECTED NOT DETECTED Final   Serratia marcescens NOT DETECTED NOT DETECTED Final   Haemophilus influenzae NOT DETECTED NOT DETECTED Final   Neisseria meningitidis NOT DETECTED NOT DETECTED Final   Pseudomonas aeruginosa NOT DETECTED NOT DETECTED Final   Stenotrophomonas maltophilia NOT DETECTED NOT DETECTED Final   Candida albicans NOT DETECTED NOT DETECTED Final   Candida auris NOT DETECTED NOT DETECTED Final   Candida glabrata NOT DETECTED NOT DETECTED Final   Candida krusei NOT DETECTED NOT DETECTED Final   Candida parapsilosis NOT DETECTED NOT DETECTED Final   Candida tropicalis NOT DETECTED NOT DETECTED  Final   Cryptococcus neoformans/gattii NOT DETECTED NOT DETECTED Final   Meth resistant mecA/C and MREJ NOT DETECTED NOT DETECTED Final     Comment: Performed at [facility], [address], [place], [place] [address]  Culture, blood (Routine X 2) w Reflex to ID Panel     Status: None (Preliminary result)   Collection Time: 10/19/23 10:34 PM   Specimen: BLOOD LEFT HAND  Result Value Ref Range Status   Specimen Description BLOOD LEFT HAND  Final   Special Requests   Final    BOTTLES DRAWN AEROBIC AND ANAEROBIC Blood Culture adequate volume   Culture  Setup Time PENDING  Incomplete   Culture   Final    CULTURE REINCUBATED FOR BETTER GROWTH Performed at [facility], [address] [place], [place] [address]    Report Status PENDING  Incomplete      Radiology Studies: No results found.     LOS: 7 days   Shaina Gullatt [employer] on [email or web address]  10/21/2023, 11:35 AM

## 2023-10-21 NOTE — Progress Notes (Signed)
  Progress Note  Patient Name: James Chan Date of Encounter: 10/21/2023 [facility] Cardiologist: Redell Leiter, MD   Interval Summary   Feeling well without acute complaints  Vital Signs Vitals:   10/20/23 2239 10/20/23 2300 10/21/23 0327 10/21/23 0733  BP: (!) 110/56 (!) 127/57 (!) 128/48 (!) 151/61  Pulse: (!) 59 (!) 52 (!) 56 60  Resp: (!) 24 20 16  (!) 26  Temp: 97.9 F (36.6 C)  97.6 F (36.4 C) 97.6 F (36.4 C)  TempSrc: Oral  Oral Oral  SpO2: 97% 96% 96% 95%  Weight:      Height:        Intake/Output Summary (Last 24 hours) at 10/21/2023 0808 Last data filed at 10/20/2023 1634 Gross per 24 hour  Intake 149.22 ml  Output --  Net 149.22 ml      10/19/2023    7:04 PM 10/19/2023    3:30 PM 10/17/2023    5:00 AM  Last 3 Weights  Weight (lbs) 151 lb 0.2 oz 155 lb 13.8 oz 153 lb 12.8 oz  Weight (kg) 68.5 kg 70.7 kg 69.763 kg      Telemetry/ECG  Sinus rhythm- Personally Reviewed  Physical Exam  GEN: No acute distress.   Neck: No JVD Cardiac: RRR, no murmurs, rubs, or gallops.  Respiratory: Clear to auscultation bilaterally. GI: Soft, nontender, non-distended  MS: No edema  Assessment & Plan   1.  Coronary artery disease with stable angina: Severe LAD stenosis post high risk stenting.  Continue aspirin , Brilinta , statin.  No current chest pain.  2.  Anemia: Had hematoma post catheterization.  Has been stable over the last few days.  Continue monitoring.  3.  End-stage renal disease: On dialysis per nephrology  4.  Hyponatremia: Sodium 126.  Plan per nephrology  5.  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  Continue amiodarone  and mexiletine.  6.  Deconditioning: PT ordered  7.  Sepsis/PIV infection: Staff aureus species noted on culture.  Awaiting sensitivities.  On vancomycin .  Appreciate assistance by hospitalist service.    For questions or updates, please contact [facility] Please consult [email or web address] for contact info under        Signed, Tevis Dunavan Gladis Norton, MD

## 2023-10-22 DIAGNOSIS — I255 Ischemic cardiomyopathy: Secondary | ICD-10-CM | POA: Diagnosis not present

## 2023-10-22 DIAGNOSIS — Z95810 Presence of automatic (implantable) cardiac defibrillator: Secondary | ICD-10-CM

## 2023-10-22 DIAGNOSIS — I809 Phlebitis and thrombophlebitis of unspecified site: Secondary | ICD-10-CM

## 2023-10-22 DIAGNOSIS — B9561 Methicillin susceptible Staphylococcus aureus infection as the cause of diseases classified elsewhere: Secondary | ICD-10-CM | POA: Diagnosis not present

## 2023-10-22 DIAGNOSIS — I25110 Atherosclerotic heart disease of native coronary artery with unstable angina pectoris: Secondary | ICD-10-CM | POA: Diagnosis not present

## 2023-10-22 DIAGNOSIS — R7881 Bacteremia: Secondary | ICD-10-CM | POA: Diagnosis not present

## 2023-10-22 LAB — GLUCOSE, CAPILLARY
Glucose-Capillary: 121 mg/dL — ABNORMAL HIGH (ref 70–99)
Glucose-Capillary: 82 mg/dL (ref 70–99)
Glucose-Capillary: 130 mg/dL — ABNORMAL HIGH (ref 70–99)

## 2023-10-22 LAB — COMPREHENSIVE METABOLIC PANEL WITH GFR
ALT: 56 U/L — ABNORMAL HIGH (ref 0–44)
AST: 55 U/L — ABNORMAL HIGH (ref 15–41)
Albumin: 2.7 g/dL — ABNORMAL LOW (ref 3.5–5.0)
Alkaline Phosphatase: 90 U/L (ref 38–126)
Anion gap: 14 (ref 5–15)
BUN: 53 mg/dL — ABNORMAL HIGH (ref 8–23)
CO2: 24 mmol/L (ref 22–32)
Calcium: 8.8 mg/dL — ABNORMAL LOW (ref 8.9–10.3)
Chloride: 90 mmol/L — ABNORMAL LOW (ref 98–111)
Creatinine, Ser: 6.39 mg/dL — ABNORMAL HIGH (ref 0.61–1.24)
GFR, Estimated: 9 mL/min — ABNORMAL LOW (ref >60)
Glucose, Bld: 73 mg/dL (ref 70–99)
Potassium: 4.6 mmol/L (ref 3.5–5.1)
Sodium: 128 mmol/L — ABNORMAL LOW (ref 135–145)
Total Bilirubin: 1.1 mg/dL (ref 0.0–1.2)
Total Protein: 5.9 g/dL — ABNORMAL LOW (ref 6.5–8.1)

## 2023-10-22 LAB — CBC WITH DIFFERENTIAL/PLATELET
Abs Immature Granulocytes: 0.19 10*3/uL — ABNORMAL HIGH (ref 0.00–0.07)
Basophils Absolute: 0.1 10*3/uL (ref 0.0–0.1)
Basophils Relative: 1 %
Eosinophils Absolute: 0.3 10*3/uL (ref 0.0–0.5)
Eosinophils Relative: 2 %
HCT: 26.1 % — ABNORMAL LOW (ref 39.0–52.0)
Hemoglobin: 9 g/dL — ABNORMAL LOW (ref 13.0–17.0)
Immature Granulocytes: 2 %
Lymphocytes Relative: 6 %
Lymphs Abs: 0.7 10*3/uL (ref 0.7–4.0)
MCH: 34 pg (ref 26.0–34.0)
MCHC: 34.5 g/dL (ref 30.0–36.0)
MCV: 98.5 fL (ref 80.0–100.0)
Monocytes Absolute: 1.1 10*3/uL — ABNORMAL HIGH (ref 0.1–1.0)
Monocytes Relative: 9 %
Neutro Abs: 10.1 10*3/uL — ABNORMAL HIGH (ref 1.7–7.7)
Neutrophils Relative %: 80 %
Platelets: 198 10*3/uL (ref 150–400)
RBC: 2.65 MIL/uL — ABNORMAL LOW (ref 4.22–5.81)
RDW: 15.3 % (ref 11.5–15.5)
WBC: 12.4 10*3/uL — ABNORMAL HIGH (ref 4.0–10.5)
nRBC: 0.3 % — ABNORMAL HIGH (ref 0.0–0.2)

## 2023-10-22 LAB — IRON AND TIBC
Iron: 156 ug/dL (ref 45–182)
Saturation Ratios: 93 % — ABNORMAL HIGH (ref 17.9–39.5)
TIBC: 168 ug/dL — ABNORMAL LOW (ref 250–450)
UIBC: 12 ug/dL

## 2023-10-22 LAB — PHOSPHORUS: Phosphorus: 5.5 mg/dL — ABNORMAL HIGH (ref 2.5–4.6)

## 2023-10-22 LAB — FERRITIN: Ferritin: >7500 ng/mL — ABNORMAL HIGH (ref 24–336)

## 2023-10-22 MED ORDER — CEFAZOLIN SODIUM-DEXTROSE 2-4 GM/100ML-% IV SOLN: 2.0000 g | INTRAVENOUS | Status: DC

## 2023-10-22 MED ORDER — HEPARIN SODIUM (PORCINE) 1000 UNIT/ML DIALYSIS: 1000.0000 [IU] | INTRAMUSCULAR | Status: DC | PRN

## 2023-10-22 MED ORDER — ONDANSETRON HCL 4 MG PO TABS
4.0000 mg | ORAL_TABLET | Freq: Four times a day (QID) | ORAL | Status: DC | PRN
  Administered 2023-10-22 – 2023-10-24 (×4): 4 mg via ORAL
  Filled 2023-10-22 (×4): qty 1

## 2023-10-22 MED ORDER — ONDANSETRON HCL 4 MG/2ML IJ SOLN: 4.0000 mg | Freq: Four times a day (QID) | INTRAMUSCULAR | Status: DC | PRN

## 2023-10-22 MED ORDER — ALTEPLASE 2 MG IJ SOLR: 2.0000 mg | Freq: Once | INTRAMUSCULAR | Status: DC | PRN

## 2023-10-22 MED ORDER — LIDOCAINE HCL (PF) 1 % IJ SOLN: 5.0000 mL | INTRAMUSCULAR | Status: DC | PRN

## 2023-10-22 MED ORDER — VANCOMYCIN HCL 1500 MG/300ML IV SOLN
1500.0000 mg | INTRAVENOUS | Status: AC
  Administered 2023-10-22: 1500 mg via INTRAVENOUS
  Filled 2023-10-22: qty 300

## 2023-10-22 MED ORDER — PENTAFLUOROPROP-TETRAFLUOROETH EX AERO: 1.0000 | INHALATION_SPRAY | CUTANEOUS | Status: DC | PRN

## 2023-10-22 MED ORDER — LIDOCAINE-PRILOCAINE 2.5-2.5 % EX CREA: 1.0000 | TOPICAL_CREAM | CUTANEOUS | Status: DC | PRN

## 2023-10-22 NOTE — Plan of Care (Deleted)
  Problem: Education: Goal: Understanding of cardiac disease, CV risk reduction, and recovery process will improve Outcome: Progressing   Problem: Cardiac: Goal: Ability to achieve and maintain adequate cardiovascular perfusion will improve Outcome: Progressing   Problem: Education: Goal: Knowledge of General Education information will improve Description: Including pain rating scale, medication(s)/side effects and non-pharmacologic comfort measures Outcome: Progressing   Problem: Clinical Measurements: Goal: Ability to maintain clinical measurements within normal limits will improve Outcome: Progressing Goal: Respiratory complications will improve Outcome: Progressing   Problem: Elimination: Goal: Will not experience complications related to urinary retention Outcome: Progressing   Problem: Pain Managment: Goal: General experience of comfort will improve and/or be controlled Outcome: Progressing

## 2023-10-22 NOTE — Care Management Important Message (Signed)
 Important Message  Patient Details  Name: James Chan MRN: [id number] Date of Birth: 07/28/1950   Important Message Given:  Yes - Medicare IM     Claretta Deed 10/22/2023, 2:23 PM
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
## 2023-10-22 NOTE — Progress Notes (Signed)
 [facility] Progress Note   Subjective:    Some nausea.  Otherwise no complaints.  Denies chest pain.  Objective Vitals:   10/21/23 2330 10/22/23 0335 10/22/23 0737 10/22/23 1007  BP: (!) 132/44 (!) 134/54 132/61 (!) 128/53  Pulse: (!) 56 (!) 55 (!) 56 (!) 53  Resp: 18 13 17 17   Temp: 97.8 F (36.6 C) 97.7 F (36.5 C) 97.8 F (36.6 C) 97.8 F (36.6 C)  TempSrc: Oral Oral Oral Oral  SpO2: 92% 96% 91% 93%  Weight:      Height:       Physical Exam General: Lying bed, nad Heart: bradycardia, no rub Lungs: Bilateral chest rise with no increased work of breathing Abdomen: soft, non tender Extremities: no LE edema Dialysis Access: R AVF +T/B  Filed Weights   10/17/23 0500 10/19/23 1530 10/19/23 1904  Weight: 69.8 kg 70.7 kg 68.5 kg    Intake/Output Summary (Last 24 hours) at 10/22/2023 1025 Last data filed at 10/22/2023 0838 Gross per 24 hour  Intake 840 ml  Output --  Net 840 ml    Additional Objective Labs: Basic Metabolic Panel: Recent Labs  Lab 10/17/23 0418 10/17/23 1552 10/19/23 0218 10/20/23 1633 10/21/23 0311 10/22/23 0234  NA 128*   < > 126* 128* 126* 128*  K 5.1   < > 5.7* 4.4 4.3 4.6  CL 94*   < > 89* 90* 90* 90*  CO2 22   < > 22 23 27 24   GLUCOSE 90   < > 148* 110* 95 73  BUN 39*   < > 64* 44* 46* 53*  CREATININE 4.00*   < > 6.05* 5.06* 5.66* 6.39*  CALCIUM  8.7*   < > 9.1 8.8* 8.6* 8.8*  PHOS 4.8*  --  7.0*  --   --  5.5*   < > = values in this interval not displayed.   Liver Function Tests: Recent Labs  Lab 10/20/23 1633 10/21/23 0311 10/22/23 0234  AST 96* 74* 55*  ALT 111* 95* 56*  ALKPHOS 78 85 90  BILITOT 1.3* 1.4* 1.1  PROT 6.0* 6.1* 5.9*  ALBUMIN 2.7* 2.7* 2.7*   No results for input(s): LIPASE, AMYLASE in the last 168 hours. CBC: Recent Labs  Lab 10/19/23 0218 10/19/23 2235 10/20/23 1633 10/21/23 0311 10/22/23 0234  WBC 12.9* 16.4* 14.2* 12.8* 12.4*  NEUTROABS  --  14.5* 12.0* 10.9* 10.1*  HGB  9.1* 9.1* 9.3* 8.6* 9.0*  HCT 26.6* 26.3* 27.1* 25.0* 26.1*  MCV 97.1 97.0 98.5 97.3 98.5  PLT 174 192 165 198 198   Blood Culture    Component Value Date/Time   SDES BLOOD LEFT HAND 10/19/2023 2234   SPECREQUEST  10/19/2023 2234    BOTTLES DRAWN AEROBIC AND ANAEROBIC Blood Culture adequate volume   CULT (A) 10/19/2023 2234    BACILLUS SPECIES Standardized susceptibility testing for this organism is not available. CULTURE REINCUBATED FOR BETTER GROWTH CRITICAL RESULT CALLED TO, READ BACK BY AND VERIFIED WITH: PHARMD J MILLEN [id number] AT 742 AM BY CM Performed at [facility], [address], [place], [place] [address]    REPTSTATUS PENDING 10/19/2023 2234    Cardiac Enzymes: No results for input(s): CKTOTAL, CKMB, CKMBINDEX, TROPONINI in the last 168 hours. CBG: Recent Labs  Lab 10/21/23 0603 10/21/23 1130 10/21/23 1612 10/21/23 2059 10/22/23 0607  GLUCAP 104* 111* 97 90 121*   Iron Studies: No results for input(s): IRON, TIBC, TRANSFERRIN, FERRITIN in the last 72 hours.  Lab Results  Component Value Date   INR 1.1 01/14/2020   Studies/Results: No results found.  Medications:  anticoagulant sodium citrate       ceFAZolin  (ANCEF ) IV      allopurinol   100 mg Oral q1800   amiodarone   200 mg Oral Daily   aspirin  EC  81 mg Oral Daily   atorvastatin   80 mg Oral Daily   Chlorhexidine  Gluconate Cloth  6 each Topical Q0600   heparin   5,000 Units Subcutaneous Q8H   insulin  aspart  0-6 Units Subcutaneous TID WC   latanoprost   1 drop Both Eyes QHS   mexiletine  250 mg Oral BID   midodrine   15 mg Oral Q M,W,F   pyridoxine   100 mg Oral Q1500   sodium chloride  flush  3 mL Intravenous Q12H   sodium chloride  flush  3 mL Intravenous Q12H   ticagrelor   90 mg Oral BID   torsemide   100 mg Oral Once per day on Sunday Tuesday Thursday Saturday   traZODone   50 mg Oral QHS    Dialysis Orders: MWF - [facility] 3:30hr, 400/A1.5, EDW 67.3kg, 2K/2.5Ca, UFP #2, AVF, no  heparin  - No ESA - Calcitriol  0.75mcg PO q HD - Sensipar 30mg  PO q HD  Assessment/Plan: CAD/Unstable angina. Cardiology following, LHC 6/23 with severe multivessel disease. CTS consulted, poor candidate for surgery. Underwent cardiac cath 6/25 with stents to LAD.  Post procedure groin hematoma. Per cardiology.   Hx VT/VF.  Unstable recently. ICD in place. On amio and mexiletine ESRD:  HD MWF.  Hypotension/CHF: BP soft post procedure. Off Coreg . Midodrine  added TID.  Low EF with right-sided systolic dysfunction.  Goal-directed therapy limited by blood pressure Anemia: Hgb around 9. Obtain iron studies. Received Aranesp  40 on 6/26.    Metabolic bone disease: Phos now at goal  T2DM  HFrEF, severe CAD  COPD   [facility] 10/22/2023,10:25 AM  LOS: 8 days

## 2023-10-22 NOTE — Progress Notes (Signed)
 PT Cancellation Note  Patient Details Name: [name] MRN: [id number] DOB: 03-Jun-1950   Cancelled Treatment:    Reason Eval/Treat Not Completed: (P) Patient at procedure or test/unavailable (pt off unit at HD. He did work with mobility specialist in AM prior to HD.) Will continue efforts next date per PT plan of care as schedule permits.   Connell HERO Cristel Rail 10/22/2023, 12:36 PM

## 2023-10-22 NOTE — Progress Notes (Signed)
 [facility] PROGRESS NOTE   James Chan FMW:[id number] DOB: 19-Jun-1950 DOA: 10/14/2023  PCP: [name], MD  Brief History: 73 y.o. 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 Inocencio on 10/08/2023 with recurrent ICD discharge due to recurrent VT VF requiring 6 shocks on 10/05/2023, came into the hospital for his chest pain and syncope episode.  Admitted for unstable angina.  Underwent high risk stenting of severe LAD stenosis.    Subjective/Interval History: Patient denies any complaints this morning.  No chest pain or shortness of breath.    Assessment/Plan:  Staph bacteremia in the setting of peripheral IV infection No significant warmth is noted in the antecubital area of the left upper extremity.  Does have some erythema.  Arm is stable today. Patient noted to be on cefazolin .  ID has been consulted. Defer further management regarding the bacteremia to infectious disease.  Coronary artery disease/chronic systolic CHF Managed by cardiology.  Underwent high risk stenting of the LAD during this hospitalization. Based on echocardiogram LVEF is less than 20%.  End-stage renal disease on hemodialysis Nephrology is managing.  Hyponatremia Normocytic anemia Leukocytosis Diabetes mellitus type 2   TRH will follow peripherally for now.  Management for his bacteremia is as per ID.    Medications: Scheduled:  allopurinol   100 mg Oral q1800   amiodarone   200 mg Oral Daily   aspirin  EC  81 mg Oral  Daily   atorvastatin   80 mg Oral Daily   Chlorhexidine  Gluconate Cloth  6 each Topical Q0600   heparin   5,000 Units Subcutaneous Q8H   insulin  aspart  0-6 Units Subcutaneous TID WC   latanoprost   1 drop Both Eyes QHS   mexiletine  250 mg Oral BID   midodrine   15 mg Oral Q M,W,F   pyridoxine   100 mg Oral Q1500   sodium chloride  flush  3 mL Intravenous Q12H   sodium chloride  flush  3 mL Intravenous Q12H   ticagrelor   90 mg Oral BID   torsemide   100 mg Oral Once per day on Sunday Tuesday Thursday Saturday   traZODone   50 mg Oral QHS   Continuous:  anticoagulant sodium citrate      vancomycin      PRN:acetaminophen , albuterol , alteplase , anticoagulant sodium citrate , feeding supplement (NEPRO CARB STEADY), heparin , lidocaine  (PF), lidocaine -prilocaine , nitroGLYCERIN , ondansetron  (ZOFRAN ) IV **OR** ondansetron , oxyCODONE -acetaminophen , pentafluoroprop-tetrafluoroeth, sodium chloride  flush, sodium chloride  flush  Antibiotics: Anti-infectives (From admission, onward)    Start     Dose/Rate Route Frequency Ordered Stop   10/22/23 2000  ceFAZolin  (ANCEF ) IVPB 2g/100 mL premix  Status:  Discontinued        2 g 200 mL/hr over 30 Minutes Intravenous Every M-W-F (2000) 10/21/23 0935 10/22/23 0908   10/22/23 1200  ceFAZolin  (ANCEF ) IVPB 2g/100 mL premix  Status:  Discontinued        2 g 200 mL/hr over 30 Minutes Intravenous Every M-W-F (Hemodialysis) 10/22/23 0908 10/22/23 1031   10/22/23 1200  vancomycin  (VANCOREADY) IVPB 1500 mg/300 mL        1,500 mg 150 mL/hr over 120 Minutes Intravenous Every M-W-F (Hemodialysis)  10/22/23 1031 10/24/23 1159   10/21/23 1700  ceFAZolin  (ANCEF ) injection 1 g        1 g Intramuscular  Once 10/21/23 1608 10/21/23 1943   10/20/23 1400  ceFAZolin  (ANCEF ) IVPB 1 g/50 mL premix  Status:  Discontinued        1 g 100 mL/hr over 30 Minutes Intravenous Every 24 hours 10/20/23 1301 10/21/23 1608       Objective:  Vital Signs  Vitals:   10/22/23 1007 10/22/23  1036 10/22/23 1049 10/22/23 1100  BP: (!) 128/53 (!) 135/57 (!) 129/53 (!) 140/54  Pulse: (!) 53 (!) 55 (!) 53   Resp: 17 (!) 21 13 13   Temp: 97.8 F (36.6 C) (!) 97.5 F (36.4 C)    TempSrc: Oral     SpO2: 93%     Weight:  69.4 kg    Height:        Intake/Output Summary (Last 24 hours) at 10/22/2023 1125 Last data filed at 10/22/2023 0838 Gross per 24 hour  Intake 840 ml  Output --  Net 840 ml   Filed Weights   10/19/23 1530 10/19/23 1904 10/22/23 1036  Weight: 70.7 kg 68.5 kg 69.4 kg    General appearance: Awake alert.  In no distress Resp: Clear to auscultation bilaterally.  Normal effort Cardio: S1-S2 is normal regular.  No S3-S4.  No rubs murmurs or bruit GI: Abdomen is soft.  Nontender nondistended.  Bowel sounds are present normal.  No masses organomegaly Left antecubital area is stable.  Improvement noted in the swelling.  Lab Results:  Data Reviewed: I have personally reviewed following labs and reports of the imaging studies  CBC: Recent Labs  Lab 10/18/23 0217 10/18/23 1457 10/19/23 0218 10/19/23 2235 10/20/23 1633 10/21/23 0311 10/22/23 0234  WBC 12.6*   < > 12.9* 16.4* 14.2* 12.8* 12.4*  NEUTROABS 11.9*  --   --  14.5* 12.0* 10.9* 10.1*  HGB 8.8*   < > 9.1* 9.1* 9.3* 8.6* 9.0*  HCT 26.4*   < > 26.6* 26.3* 27.1* 25.0* 26.1*  MCV 98.9   < > 97.1 97.0 98.5 97.3 98.5  PLT 147*   < > 174 192 165 198 198   < > = values in this interval not displayed.    Basic Metabolic Panel: Recent Labs  Lab 10/17/23 0418 10/17/23 1552 10/18/23 0739 10/19/23 0218 10/20/23 1633 10/21/23 0311 10/22/23 0234  NA 128*   < > 126* 126* 128* 126* 128*  K 5.1   < > 4.7 5.7* 4.4 4.3 4.6  CL 94*  --  91* 89* 90* 90* 90*  CO2 22  --  18* 22 23 27 24   GLUCOSE 90  --  187* 148* 110* 95 73  BUN 39*  --  54* 64* 44* 46* 53*  CREATININE 4.00*  --  5.11* 6.05* 5.06* 5.66* 6.39*  CALCIUM  8.7*  --  8.9 9.1 8.8* 8.6* 8.8*  PHOS 4.8*  --   --  7.0*  --   --  5.5*   < > =  values in this interval not displayed.    GFR: Estimated Creatinine Clearance: 9.7 mL/min (A) (by C-G formula based on SCr of 6.39 mg/dL (H)).  Liver Function Tests: Recent Labs  Lab 10/17/23 0418 10/19/23 0218 10/20/23 1633 10/21/23 0311 10/22/23 0234  AST  --   --  96* 74* 55*  ALT  --   --  111* 95* 56*  ALKPHOS  --   --  78 85 90  BILITOT  --   --  1.3* 1.4* 1.1  PROT  --   --  6.0* 6.1* 5.9*  ALBUMIN 3.2* 3.0* 2.7* 2.7* 2.7*    HbA1C: Recent Labs    10/20/23 1945  HGBA1C 4.6*    CBG: Recent Labs  Lab 10/21/23 0603 10/21/23 1130 10/21/23 1612 10/21/23 2059 10/22/23 0607  GLUCAP 104* 111* 97 90 121*    Recent Results (from the past 240 hours)  MRSA Next Gen by PCR, Nasal     Status: None   Collection Time: 10/17/23  8:13 PM   Specimen: Nasal Mucosa; Nasal Swab  Result Value Ref Range Status   MRSA by PCR Next Gen NOT DETECTED NOT DETECTED Final    Comment: (NOTE) The GeneXpert MRSA Assay (FDA approved for NASAL specimens only), is one component of a comprehensive MRSA colonization surveillance program. It is not intended to diagnose MRSA infection nor to guide or monitor treatment for MRSA infections. Test performance is not FDA approved in patients less than 108 years old. Performed at [facility], [address] [place], [place] [address]   Culture, blood (Routine X 2) w Reflex to ID Panel     Status: None (Preliminary result)   Collection Time: 10/19/23 10:32 PM   Specimen: BLOOD LEFT HAND  Result Value Ref Range Status   Specimen Description BLOOD LEFT HAND  Final   Special Requests   Final    BOTTLES DRAWN AEROBIC AND ANAEROBIC Blood Culture results may not be optimal due to an inadequate volume of blood received in culture bottles   Culture  Setup Time   Final    GRAM POSITIVE COCCI AEROBIC BOTTLE ONLY CRITICAL RESULT CALLED TO, READ BACK BY AND VERIFIED WITH: PHARMD HEATHER TANDA [id number] AT 1240 BY EC    Culture   Final    GRAM  POSITIVE COCCI CULTURE REINCUBATED FOR BETTER GROWTH BACILLUS SPECIES Standardized susceptibility testing for this organism is not available. CRITICAL RESULT CALLED TO, READ BACK BY AND VERIFIED WITH: MAYA JINNY BOGA [id number] AT 742 BY CM Performed at [facility], [address] [place], [place] [address]    Report Status PENDING  Incomplete  Blood Culture ID Panel (Reflexed)     Status: Abnormal   Collection Time: 10/19/23 10:32 PM  Result Value Ref Range Status   Enterococcus faecalis NOT DETECTED NOT DETECTED Final   Enterococcus Faecium NOT DETECTED NOT DETECTED Final   Listeria monocytogenes NOT DETECTED NOT DETECTED Final   Staphylococcus species DETECTED (A) NOT DETECTED Final    Comment: CRITICAL RESULT CALLED TO, READ BACK BY AND VERIFIED WITH: PHARMD HEATHER WILSON [id number] AT 1240 BY EC    Staphylococcus aureus (BCID) (A) NOT DETECTED Final    CRITICAL RESULT CALLED TO, READ BACK BY AND VERIFIED WITH:    Comment: PHARMD POWELL TANDA [id number] BY EC   Staphylococcus epidermidis NOT DETECTED NOT DETECTED Final   Staphylococcus lugdunensis NOT DETECTED NOT DETECTED Final   Streptococcus species NOT DETECTED NOT DETECTED Final   Streptococcus agalactiae NOT DETECTED NOT DETECTED Final   Streptococcus pneumoniae NOT DETECTED NOT DETECTED Final   Streptococcus pyogenes NOT DETECTED NOT DETECTED Final   A.calcoaceticus-baumannii NOT DETECTED NOT DETECTED Final   Bacteroides fragilis NOT DETECTED NOT DETECTED Final   Enterobacterales NOT DETECTED NOT DETECTED Final   Enterobacter cloacae complex NOT DETECTED NOT DETECTED Final   Escherichia coli NOT DETECTED NOT DETECTED Final   Klebsiella aerogenes  NOT DETECTED NOT DETECTED Final   Klebsiella oxytoca NOT DETECTED NOT DETECTED Final   Klebsiella pneumoniae NOT DETECTED NOT DETECTED Final   Proteus species NOT DETECTED NOT DETECTED Final   Salmonella species NOT DETECTED NOT DETECTED Final   Serratia marcescens  NOT DETECTED NOT DETECTED Final   Haemophilus influenzae NOT DETECTED NOT DETECTED Final   Neisseria meningitidis NOT DETECTED NOT DETECTED Final   Pseudomonas aeruginosa NOT DETECTED NOT DETECTED Final   Stenotrophomonas maltophilia NOT DETECTED NOT DETECTED Final   Candida albicans NOT DETECTED NOT DETECTED Final   Candida auris NOT DETECTED NOT DETECTED Final   Candida glabrata NOT DETECTED NOT DETECTED Final   Candida krusei NOT DETECTED NOT DETECTED Final   Candida parapsilosis NOT DETECTED NOT DETECTED Final   Candida tropicalis NOT DETECTED NOT DETECTED Final   Cryptococcus neoformans/gattii NOT DETECTED NOT DETECTED Final   Meth resistant mecA/C and MREJ NOT DETECTED NOT DETECTED Final    Comment: Performed at [facility], [address], [place], [place] [address]  Culture, blood (Routine X 2) w Reflex to ID Panel     Status: Abnormal (Preliminary result)   Collection Time: 10/19/23 10:34 PM   Specimen: BLOOD LEFT HAND  Result Value Ref Range Status   Specimen Description BLOOD LEFT HAND  Final   Special Requests   Final    BOTTLES DRAWN AEROBIC AND ANAEROBIC Blood Culture adequate volume   Culture (A)  Final    BACILLUS SPECIES Standardized susceptibility testing for this organism is not available. CULTURE REINCUBATED FOR BETTER GROWTH CRITICAL RESULT CALLED TO, READ BACK BY AND VERIFIED WITH: PHARMD J MILLEN [id number] AT 742 AM BY CM Performed at [facility], [address], [place], [place] [address]    Report Status PENDING  Incomplete      Radiology Studies: No results found.     LOS: 8 days   Kie Calvin [employer] on [email or web address]  10/22/2023, 11:25 AM

## 2023-10-22 NOTE — Progress Notes (Signed)
 Pharmacy Antibiotic Note  James Chan is a 73 y.o. male admitted on 10/14/2023 with chest pain. He has now developed MSSA and possible bacillus bacteremia likely from a PIV site on the left arm.  Pharmacy has been consulted for vancomycin  dosing for improved bacillus coverage. Patient is ESRD with HD MWF. Notably patient does not have IV access so will need to receive antibiotics with his dialysis sessions.   Plan: Vanc 1500 mg x 1 today with dialysis Then 750 mg with each HD MWF  Monitor blood cultures- Repeats today taken prior to Bacillus coverage so will help determine if pathogen is a contaminant Monitor HD sessions and Vanc levels as appropriate   Height: 5' 6 (167.6 cm) Weight: 69.4 kg (153 lb) IBW/kg (Calculated) : 63.8  Temp (24hrs), Avg:97.8 F (36.6 C), Min:97.5 F (36.4 C), Max:98.1 F (36.7 C)  Recent Labs  Lab 10/18/23 0106 10/18/23 0217 10/18/23 0739 10/18/23 1457 10/19/23 0218 10/19/23 2232 10/19/23 2235 10/20/23 1633 10/21/23 0311 10/22/23 0234  WBC  --    < >  --    < > 12.9*  --  16.4* 14.2* 12.8* 12.4*  CREATININE  --   --  5.11*  --  6.05*  --   --  5.06* 5.66* 6.39*  LATICACIDVEN 0.9  --   --   --   --  1.4  --   --   --   --    < > = values in this interval not displayed.    Estimated Creatinine Clearance: 9.7 mL/min (A) (by C-G formula based on SCr of 6.39 mg/dL (H)).    No Known Allergies  Antimicrobials this admission:   Thank you for allowing pharmacy to be a part of this patient's care.  Damien Quiet, PharmD, BCPS, BCIDP Infectious Diseases Clinical Pharmacist Phone: [phone] 10/22/2023 1:23 PM

## 2023-10-22 NOTE — Procedures (Signed)
 Received patient in bed to unit.  Alert and oriented.  Informed consent signed and in chart.   TX duration: 3 hours  Patient tolerated well.  Transported back to the room  Alert, without acute distress.  Hand-off given to patient's nurse.   Access used: right fistula Access issues: none  Total UF removed: 1 liter Medication(s) given: vanc   Greig Silvan, RN [facility]

## 2023-10-22 NOTE — Progress Notes (Signed)
 Mobility Specialist Progress Note;   10/22/23 0900  Mobility  Activity Ambulated with assistance in hallway;Transferred from bed to chair  Level of Assistance Contact guard assist, steadying assist  Assistive Device Front wheel walker  Distance Ambulated (ft) 100 ft  Activity Response Tolerated well  Mobility Referral Yes  Mobility visit 1 Mobility  Mobility Specialist Start Time (ACUTE ONLY) 0900  Mobility Specialist Stop Time (ACUTE ONLY) 0920  Mobility Specialist Time Calculation (min) (ACUTE ONLY) 20 min   Pt agreeable to mobility. Required MinG assistance during ambulation for safety. VSS throughout. Transferred pt to chair at [facility]. Pt left with all needs met, call bell in reach.   Lauraine Erm Mobility Specialist Please contact via Secure[employer] or [employer] [phone]

## 2023-10-22 NOTE — Progress Notes (Addendum)
 Progress Note  Patient Name: James Chan Date of Encounter: 10/22/2023 [facility] Cardiologist: Redell Leiter, MD   Interval Summary    Sitting up in the chair, some nausea this morning  Vital Signs Vitals:   10/21/23 1920 10/21/23 2330 10/22/23 0335 10/22/23 0737  BP: (!) 123/91 (!) 132/44 (!) 134/54 132/61  Pulse: (!) 56 (!) 56 (!) 55 (!) 56  Resp: 16 18 13 17   Temp: 98.1 F (36.7 C) 97.8 F (36.6 C) 97.7 F (36.5 C) 97.8 F (36.6 C)  TempSrc: Oral Oral Oral Oral  SpO2: 95% 92% 96% 91%  Weight:      Height:        Intake/Output Summary (Last 24 hours) at 10/22/2023 0928 Last data filed at 10/22/2023 9161 Gross per 24 hour  Intake 840 ml  Output --  Net 840 ml      10/19/2023    7:04 PM 10/19/2023    3:30 PM 10/17/2023    5:00 AM  Last 3 Weights  Weight (lbs) 151 lb 0.2 oz 155 lb 13.8 oz 153 lb 12.8 oz  Weight (kg) 68.5 kg 70.7 kg 69.763 kg      Telemetry/ECG  Sinus Bradycardia - Personally Reviewed  Physical Exam  GEN: No acute distress.   Neck: No JVD Cardiac: bradycardia, no murmurs, rubs, or gallops.  Respiratory: Clear to auscultation bilaterally. 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 Inocencio on 10/08/2023 with recurrent ICD discharge due to recurrent VT VF requiring 6 shocks on 10/05/2023 who was seen in the office on 6/23 with recommendations for outpatient  cardiac cath.   CAD -- Underwent cardiac cath 6/23 with severe multivessel CAD including complex proximal LAD/D1 lesion.  Recommendations for surgery consult.  Seen by Dr. Lucas felt would not be a good candidate for CABG given his comorbidities with recommendations for high risk PCI.  Underwent staged intervention 6/25 with successful high risk PCI of LAD, diagonal bifurcation with orbital atherectomy of both vessels and cutting balloon angioplasty.  Procedure not done with Impella support due to inability to pass Impella at the left common iliac and abdominal aortic aneurysm.  Recommendations DAPT with aspirin /Brilinta . -- continue ASA/Brilinta , statin  Acute on Chronic HFrEF -- 6/24 with decline in EF from 30 to 35%, to less than 20%, mild LVH, grade 2 diastolic dysfunction, mildly enlarged/reduced RV, severely elevated pulmonary artery systolic pressure, severe biatrial enlargement -- GDMT limited in the setting of hypotension and renal disease  ESRD on HD -- on HD per nephrology, on MWF schedule  Hx of VT/VF S/p ICD '21 -- s/p ICD shocks 10/05/2023 -- continue amiodarone , mexiletine   Bacteremia  -- BC + staph  -- currently on ancef  -- TRH and ID managing   Hyponatremia  -- Na + 126>>128  PT/OT following  For questions or updates, please contact [facility] Please consult [email or web address] for contact info under       Signed, Manuelita Rummer, NP   I  have personally seen and examined this patient. I agree with the assessment and plan as outlined above.  He has no chest pain.  Cardiac issues stable today.  Coronary intervention this admission. EF is 20%. ICD in place.  Now with bacteremia. ID and IM following Labs reviewed My exam: NAD, RRR Lungs: Clear bilat  Ext: no LE edema Plan: No changes today in his cardiac plan. Continue DAPT with ASA and Brilinta .  Continue statin. Unable to add GDMT given hypotension.  ESRD and hyponatremia per Nephrology Bacteremia  mgmt per IM and ID Discharge planning based on ID recs for his bacteremia. No changes in his cardiac plan today. He is ready for discharge from a cardiac standpoint  Lonni Cash, MD, [facility] 10/22/2023 10:11 AM

## 2023-10-23 ENCOUNTER — Inpatient Hospital Stay (HOSPITAL_COMMUNITY)

## 2023-10-23 ENCOUNTER — Encounter (HOSPITAL_COMMUNITY): Admission: RE | Payer: Self-pay | Source: Ambulatory Visit

## 2023-10-23 ENCOUNTER — Ambulatory Visit (HOSPITAL_COMMUNITY): Admission: RE | Admit: 2023-10-23 | Source: Ambulatory Visit | Admitting: Vascular Surgery

## 2023-10-23 DIAGNOSIS — R7881 Bacteremia: Secondary | ICD-10-CM

## 2023-10-23 DIAGNOSIS — B9561 Methicillin susceptible Staphylococcus aureus infection as the cause of diseases classified elsewhere: Secondary | ICD-10-CM

## 2023-10-23 DIAGNOSIS — I5023 Acute on chronic systolic (congestive) heart failure: Secondary | ICD-10-CM

## 2023-10-23 DIAGNOSIS — I8002 Phlebitis and thrombophlebitis of superficial vessels of left lower extremity: Secondary | ICD-10-CM | POA: Diagnosis not present

## 2023-10-23 DIAGNOSIS — A499 Bacterial infection, unspecified: Secondary | ICD-10-CM

## 2023-10-23 DIAGNOSIS — I809 Phlebitis and thrombophlebitis of unspecified site: Secondary | ICD-10-CM

## 2023-10-23 DIAGNOSIS — Z955 Presence of coronary angioplasty implant and graft: Secondary | ICD-10-CM

## 2023-10-23 HISTORY — DX: Bacteremia: R78.81

## 2023-10-23 HISTORY — DX: Presence of coronary angioplasty implant and graft: Z95.5

## 2023-10-23 HISTORY — DX: Phlebitis and thrombophlebitis of unspecified site: I80.9

## 2023-10-23 HISTORY — DX: Bacterial infection, unspecified: A49.9

## 2023-10-23 LAB — COMPREHENSIVE METABOLIC PANEL WITH GFR
ALT: 33 U/L (ref 0–44)
AST: 58 U/L — ABNORMAL HIGH (ref 15–41)
Albumin: 2.6 g/dL — ABNORMAL LOW (ref 3.5–5.0)
Alkaline Phosphatase: 93 U/L (ref 38–126)
Anion gap: 11 (ref 5–15)
BUN: 29 mg/dL — ABNORMAL HIGH (ref 8–23)
CO2: 27 mmol/L (ref 22–32)
Calcium: 8.4 mg/dL — ABNORMAL LOW (ref 8.9–10.3)
Chloride: 92 mmol/L — ABNORMAL LOW (ref 98–111)
Creatinine, Ser: 4.68 mg/dL — ABNORMAL HIGH (ref 0.61–1.24)
GFR, Estimated: 13 mL/min — ABNORMAL LOW (ref >60)
Glucose, Bld: 96 mg/dL (ref 70–99)
Potassium: 4 mmol/L (ref 3.5–5.1)
Sodium: 130 mmol/L — ABNORMAL LOW (ref 135–145)
Total Bilirubin: 1.3 mg/dL — ABNORMAL HIGH (ref 0.0–1.2)
Total Protein: 6.1 g/dL — ABNORMAL LOW (ref 6.5–8.1)

## 2023-10-23 LAB — CBC WITH DIFFERENTIAL/PLATELET
Abs Immature Granulocytes: 0.39 10*3/uL — ABNORMAL HIGH (ref 0.00–0.07)
Basophils Absolute: 0.1 10*3/uL (ref 0.0–0.1)
Basophils Relative: 1 %
Eosinophils Absolute: 0.3 10*3/uL (ref 0.0–0.5)
Eosinophils Relative: 2 %
HCT: 27.6 % — ABNORMAL LOW (ref 39.0–52.0)
Hemoglobin: 9.3 g/dL — ABNORMAL LOW (ref 13.0–17.0)
Immature Granulocytes: 3 %
Lymphocytes Relative: 6 %
Lymphs Abs: 0.7 10*3/uL (ref 0.7–4.0)
MCH: 33.7 pg (ref 26.0–34.0)
MCHC: 33.7 g/dL (ref 30.0–36.0)
MCV: 100 fL (ref 80.0–100.0)
Monocytes Absolute: 1.1 10*3/uL — ABNORMAL HIGH (ref 0.1–1.0)
Monocytes Relative: 9 %
Neutro Abs: 9.5 10*3/uL — ABNORMAL HIGH (ref 1.7–7.7)
Neutrophils Relative %: 79 %
Platelets: 210 10*3/uL (ref 150–400)
RBC: 2.76 MIL/uL — ABNORMAL LOW (ref 4.22–5.81)
RDW: 15.9 % — ABNORMAL HIGH (ref 11.5–15.5)
WBC: 12 10*3/uL — ABNORMAL HIGH (ref 4.0–10.5)
nRBC: 0.2 % (ref 0.0–0.2)

## 2023-10-23 LAB — GLUCOSE, CAPILLARY
Glucose-Capillary: 91 mg/dL (ref 70–99)
Glucose-Capillary: 95 mg/dL (ref 70–99)
Glucose-Capillary: 110 mg/dL — ABNORMAL HIGH (ref 70–99)
Glucose-Capillary: 129 mg/dL — ABNORMAL HIGH (ref 70–99)

## 2023-10-23 SURGERY — A/V SHUNT INTERVENTION
Anesthesia: LOCAL | Site: Arm Upper | Laterality: Right

## 2023-10-23 MED ORDER — VANCOMYCIN HCL 750 MG/150ML IV SOLN
750.0000 mg | INTRAVENOUS | Status: DC
  Filled 2023-10-23: qty 150

## 2023-10-23 MED ORDER — VANCOMYCIN IV (FOR PTA / DISCHARGE USE ONLY): 750.0000 mg | INTRAVENOUS | Status: AC

## 2023-10-23 NOTE — TOC Progression Note (Signed)
 Transition of Care [facility]) - Progression Note    Patient Details  Name: James Chan MRN: [id number] Date of Birth: 1951/01/11  [facility]) CM/SW Contact  Roxie KANDICE Stain, RN Phone Number: 10/23/2023, 12:05 PM  Clinical Narrative:     Patient is agreeable to rollator. Notified Haley with [employer] of DME order.  Rollator will be delivered to the room.  Expected Discharge Plan: Home/Self Care Barriers to Discharge: No Barriers Identified  Expected Discharge Plan and Services In-house Referral: NA Discharge Planning Services: CM Consult Post Acute Care Choice: NA Living arrangements for the past 2 months: Single Family Home                 DME Arranged: Walker rolling with seat DME Agency: [employer] Date DME Agency Contacted: 10/23/23 Time DME Agency Contacted: 1205 Representative spoke with at DME Agency: [facility] HH Arranged: NA           Social Determinants of Health (SDOH) Interventions SDOH Screenings   Food Insecurity: No Food Insecurity (10/14/2023)  Housing: Low Risk  (10/14/2023)  Transportation Needs: No Transportation Needs (10/14/2023)  Utilities: Not At Risk (10/14/2023)  Social Connections: Moderately Isolated (10/14/2023)  Tobacco Use: Medium Risk (10/14/2023)    Readmission Risk Interventions    10/17/2023   10:38 AM  Readmission Risk Prevention Plan  Transportation Screening Complete  HRI or Home Care Consult Complete  Social Work Consult for Recovery Care Planning/Counseling Complete  Palliative Care Screening Not Applicable  Medication Review Oceanographer) Referral to Pharmacy

## 2023-10-23 NOTE — Evaluation (Signed)
 Occupational Therapy Evaluation Patient Details Name: James Chan MRN: [id number] DOB: Oct 06, 1950 Today's Date: 10/23/2023   History of Present Illness   Pt is a 73 y/o male admitted 6/22 via EMS with recurrent CP starting 2 days prior to admission requiring nitroglycerin  use.  Pt s/p L heart Cath 6/25, developed a large groin hematoma which has been addressed.  PMH  Vfib, AAA, ASHD, HTN, cardiac arrest CHF, DM, ESRDGERD, Glaucoma, ICD implantation, NSTEMI, PAD, sleep apnea, stroke, USA , R/L knee sx     Clinical Impressions Patient admitted for the diagnosis above.  PTA he lives at home with his spouse, who can provide supportive assist.  Currently he presents with the deficits below, needing generalized CGA to supervision for mobility, and light Min A for balance during ADL completion.  OT will follow in the acute setting to address deficits, but no post acute HH OT is anticipated.       If plan is discharge home, recommend the following:   Assist for transportation     Functional Status Assessment   Patient has had a recent decline in their functional status and demonstrates the ability to make significant improvements in function in a reasonable and predictable amount of time.     Equipment Recommendations   Tub/shower seat     Recommendations for Other Services         Precautions/Restrictions   Precautions Precautions: Fall Recall of Precautions/Restrictions: Intact Precaution/Restrictions Comments: Limited push/pull to R UE Restrictions Weight Bearing Restrictions Per Provider Order: No     Mobility Bed Mobility Overal bed mobility: Needs Assistance Bed Mobility: Supine to Sit, Sit to Supine   Sidelying to sit: Modified independent (Device/Increase time)   Sit to supine: Supervision, Contact guard assist        Transfers Overall transfer level: Needs assistance   Transfers: Sit to/from Stand Sit to Stand: Contact guard assist                   Balance Overall balance assessment: Needs assistance Sitting-balance support: No upper extremity supported, Feet supported Sitting balance-Leahy Scale: Good     Standing balance support: Reliant on assistive device for balance Standing balance-Leahy Scale: Poor                             ADL either performed or assessed with clinical judgement   ADL       Grooming: Wash/dry hands;Wash/dry face;Supervision/safety;Standing               Lower Body Dressing: Minimal assistance;Sit to/from stand   Toilet Transfer: Ambulance person;Ambulation                   Vision Baseline Vision/History: 1 Wears glasses Patient Visual Report: No change from baseline       Perception Perception: Not tested       Praxis Praxis: Not tested       Pertinent Vitals/Pain Pain Assessment Pain Assessment: Faces Faces Pain Scale: No hurt Pain Intervention(s): Monitored during session     Extremity/Trunk Assessment Upper Extremity Assessment Upper Extremity Assessment: Overall WFL for tasks assessed   Lower Extremity Assessment Lower Extremity Assessment: Defer to PT evaluation   Cervical / Trunk Assessment Cervical / Trunk Assessment: Normal   Communication Communication Communication: No apparent difficulties   Cognition Arousal: Alert Behavior During Therapy: WFL for tasks assessed/performed Cognition: No apparent impairments  Following commands: Intact       Cueing  General Comments   Cueing Techniques: Verbal cues   HR to 110   Exercises     Shoulder Instructions      Home Living Family/patient expects to be discharged to:: Private residence Living Arrangements: Spouse/significant other Available Help at Discharge: Family;Available 24 hours/day Type of Home: House Home Access: Stairs to enter [employer] of Steps: 3 Entrance Stairs-Rails: None Home  Layout: One level     Bathroom Shower/Tub: Producer, television/film/video: Handicapped height Bathroom Accessibility: Yes   Home Equipment: Cane - single point          Prior Functioning/Environment Prior Level of Function : Independent/Modified Independent;Other (comment)                    OT Problem List: Decreased strength;Impaired balance (sitting and/or standing)   OT Treatment/Interventions: Self-care/ADL training;Therapeutic activities;Patient/family education;Balance training      OT Goals(Current goals can be found in the care plan section)   Acute Rehab OT Goals Patient Stated Goal: Return home OT Goal Formulation: With patient Time For Goal Achievement: 11/06/23 Potential to Achieve Goals: Good ADL Goals Pt Will Perform Grooming: with modified independence;standing Pt Will Perform Lower Body Dressing: with modified independence;sit to/from stand Pt Will Transfer to Toilet: with modified independence;regular height toilet;ambulating   OT Frequency:  Min 2X/week    Co-evaluation              AM-PAC OT 6 Clicks Daily Activity     Outcome Measure Help from another person eating meals?: None Help from another person taking care of personal grooming?: A Little Help from another person toileting, which includes using toliet, bedpan, or urinal?: A Little Help from another person bathing (including washing, rinsing, drying)?: A Little Help from another person to put on and taking off regular upper body clothing?: None Help from another person to put on and taking off regular lower body clothing?: A Little 6 Click Score: 20   End of Session Equipment Utilized During Treatment: Rolling walker (2 wheels);Oxygen Nurse Communication: Mobility status  Activity Tolerance: Patient tolerated treatment well Patient left: in bed;with call bell/phone within reach  OT Visit Diagnosis: Unsteadiness on feet (R26.81);Muscle weakness (generalized) (M62.81)                 Time: 9194-9175 OT Time Calculation (min): 19 min Charges:  OT General Charges $OT Visit: 1 Visit OT Evaluation $OT Eval Moderate Complexity: 1 Mod  10/23/2023  RP, OTR/L  Acute Rehabilitation Services  Office:  [phone]   Charlie JONETTA Halsted 10/23/2023, 8:54 AM

## 2023-10-23 NOTE — Progress Notes (Signed)
 Subjective: No new complaints   Antibiotics:  Anti-infectives (From admission, onward)    Start     Dose/Rate Route Frequency Ordered Stop   10/24/23 1200  vancomycin  (VANCOREADY) IVPB 750 mg/150 mL        750 mg 150 mL/hr over 60 Minutes Intravenous Every M-W-F (Hemodialysis) 10/23/23 0753     10/22/23 2000  ceFAZolin  (ANCEF ) IVPB 2g/100 mL premix  Status:  Discontinued        2 g 200 mL/hr over 30 Minutes Intravenous Every M-W-F (2000) 10/21/23 0935 10/22/23 0908   10/22/23 1200  ceFAZolin  (ANCEF ) IVPB 2g/100 mL premix  Status:  Discontinued        2 g 200 mL/hr over 30 Minutes Intravenous Every M-W-F (Hemodialysis) 10/22/23 0908 10/22/23 1031   10/22/23 1200  vancomycin  (VANCOREADY) IVPB 1500 mg/300 mL        1,500 mg 150 mL/hr over 120 Minutes Intravenous Every M-W-F (Hemodialysis) 10/22/23 1031 10/22/23 1449   10/21/23 1700  ceFAZolin  (ANCEF ) injection 1 g        1 g Intramuscular  Once 10/21/23 1608 10/21/23 1943   10/20/23 1400  ceFAZolin  (ANCEF ) IVPB 1 g/50 mL premix  Status:  Discontinued        1 g 100 mL/hr over 30 Minutes Intravenous Every 24 hours 10/20/23 1301 10/21/23 1608       Medications: Scheduled Meds:  allopurinol   100 mg Oral q1800   amiodarone   200 mg Oral Daily   aspirin  EC  81 mg Oral Daily   atorvastatin   80 mg Oral Daily   Chlorhexidine  Gluconate Cloth  6 each Topical Q0600   heparin   5,000 Units Subcutaneous Q8H   insulin  aspart  0-6 Units Subcutaneous TID WC   latanoprost   1 drop Both Eyes QHS   mexiletine  250 mg Oral BID   midodrine   15 mg Oral Q M,W,F   pyridoxine   100 mg Oral Q1500   sodium chloride  flush  3 mL Intravenous Q12H   sodium chloride  flush  3 mL Intravenous Q12H   ticagrelor   90 mg Oral BID   torsemide   100 mg Oral Once per day on Sunday Tuesday Thursday Saturday   traZODone   50 mg Oral QHS   Continuous Infusions:  [START ON 10/24/2023] vancomycin      PRN Meds:.acetaminophen , albuterol , nitroGLYCERIN ,  ondansetron  (ZOFRAN ) IV **OR** ondansetron , oxyCODONE -acetaminophen , sodium chloride  flush, sodium chloride  flush    Objective: Weight change:   Intake/Output Summary (Last 24 hours) at 10/23/2023 1505 Last data filed at 10/22/2023 1850 Gross per 24 hour  Intake 360 ml  Output --  Net 360 ml   Blood pressure (!) 133/56, pulse 60, temperature 97.6 F (36.4 C), temperature source Oral, resp. rate 19, height 5' 6 (1.676 m), weight 68.8 kg, SpO2 100%. Temp:  [97.6 F (36.4 C)-98.4 F (36.9 C)] 97.6 F (36.4 C) (07/01 1503) Pulse Rate:  [52-62] 60 (07/01 1503) Resp:  [16-20] 19 (07/01 1503) BP: (120-142)/(54-72) 133/56 (07/01 1503) SpO2:  [90 %-100 %] 100 % (07/01 1503)  Physical Exam: Physical Exam Constitutional:      Appearance: He is well-developed.  HENT:     Head: Normocephalic and atraumatic.   Eyes:     Conjunctiva/sclera: Conjunctivae normal.    Cardiovascular:     Rate and Rhythm: Normal rate and regular rhythm.  Pulmonary:     Effort: Pulmonary effort is normal. No respiratory distress.     Breath sounds: No wheezing.  Abdominal:     General: There is no distension.     Palpations: Abdomen is soft.   Musculoskeletal:        General: Normal range of motion.     Cervical back: Normal range of motion and neck supple.   Skin:    General: Skin is warm and dry.     Findings: No erythema or rash.   Neurological:     General: No focal deficit present.     Mental Status: He is alert and oriented to person, place, and time.   Psychiatric:        Mood and Affect: Mood normal.        Behavior: Behavior normal.        Thought Content: Thought content normal.        Judgment: Judgment normal.      CBC:    BMET Recent Labs    10/22/23 0234 10/23/23 0354  NA 128* 130*  K 4.6 4.0  CL 90* 92*  CO2 24 27  GLUCOSE 73 96  BUN 53* 29*  CREATININE 6.39* 4.68*  CALCIUM  8.8* 8.4*     Liver Panel  Recent Labs    10/22/23 0234 10/23/23 0354  PROT  5.9* 6.1*  ALBUMIN 2.7* 2.6*  AST 55* 58*  ALT 56* 33  ALKPHOS 90 93  BILITOT 1.1 1.3*       Sedimentation Rate No results for input(s): ESRSEDRATE in the last 72 hours. C-Reactive Protein No results for input(s): CRP in the last 72 hours.  Micro Results: Recent Results (from the past 720 hours)  MRSA Next Gen by PCR, Nasal     Status: None   Collection Time: 10/17/23  8:13 PM   Specimen: Nasal Mucosa; Nasal Swab  Result Value Ref Range Status   MRSA by PCR Next Gen NOT DETECTED NOT DETECTED Final    Comment: (NOTE) The GeneXpert MRSA Assay (FDA approved for NASAL specimens only), is one component of a comprehensive MRSA colonization surveillance program. It is not intended to diagnose MRSA infection nor to guide or monitor treatment for MRSA infections. Test performance is not FDA approved in patients less than 77 years old. Performed at [facility], [address], [place], [place] [address]   Culture, blood (Routine X 2) w Reflex to ID Panel     Status: Abnormal (Preliminary result)   Collection Time: 10/19/23 10:32 PM   Specimen: BLOOD LEFT HAND  Result Value Ref Range Status   Specimen Description BLOOD LEFT HAND  Final   Special Requests   Final    BOTTLES DRAWN AEROBIC AND ANAEROBIC Blood Culture results may not be optimal due to an inadequate volume of blood received in culture bottles   Culture  Setup Time   Final    GRAM POSITIVE COCCI AEROBIC BOTTLE ONLY CRITICAL RESULT CALLED TO, READ BACK BY AND VERIFIED WITH: PHARMD HEATHER WILSON [id number] AT 1240 BY EC    Culture (A)  Final    STAPHYLOCOCCUS AUREUS SUSCEPTIBILITIES TO FOLLOW BACILLUS SPECIES Standardized susceptibility testing for this organism is not available. CRITICAL RESULT CALLED TO, READ BACK BY AND VERIFIED WITH: MAYA JINNY BOGA [id number] AT 742 BY CM Performed at [facility], [address], [place], [place] [address]    Report Status PENDING  Incomplete  Blood Culture ID  Panel (Reflexed)     Status: Abnormal   Collection Time: 10/19/23 10:32 PM  Result Value Ref Range Status   Enterococcus faecalis  NOT DETECTED NOT DETECTED Final   Enterococcus Faecium NOT DETECTED NOT DETECTED Final   Listeria monocytogenes NOT DETECTED NOT DETECTED Final   Staphylococcus species DETECTED (A) NOT DETECTED Final    Comment: CRITICAL RESULT CALLED TO, READ BACK BY AND VERIFIED WITH: PHARMD HEATHER WILSON [id number] AT 1240 BY EC    Staphylococcus aureus (BCID) (A) NOT DETECTED Final    CRITICAL RESULT CALLED TO, READ BACK BY AND VERIFIED WITH:    Comment: PHARMD POWELL BLUSH [id number] BY EC   Staphylococcus epidermidis NOT DETECTED NOT DETECTED Final   Staphylococcus lugdunensis NOT DETECTED NOT DETECTED Final   Streptococcus species NOT DETECTED NOT DETECTED Final   Streptococcus agalactiae NOT DETECTED NOT DETECTED Final   Streptococcus pneumoniae NOT DETECTED NOT DETECTED Final   Streptococcus pyogenes NOT DETECTED NOT DETECTED Final   A.calcoaceticus-baumannii NOT DETECTED NOT DETECTED Final   Bacteroides fragilis NOT DETECTED NOT DETECTED Final   Enterobacterales NOT DETECTED NOT DETECTED Final   Enterobacter cloacae complex NOT DETECTED NOT DETECTED Final   Escherichia coli NOT DETECTED NOT DETECTED Final   Klebsiella aerogenes NOT DETECTED NOT DETECTED Final   Klebsiella oxytoca NOT DETECTED NOT DETECTED Final   Klebsiella pneumoniae NOT DETECTED NOT DETECTED Final   Proteus species NOT DETECTED NOT DETECTED Final   Salmonella species NOT DETECTED NOT DETECTED Final   Serratia marcescens NOT DETECTED NOT DETECTED Final   Haemophilus influenzae NOT DETECTED NOT DETECTED Final   Neisseria meningitidis NOT DETECTED NOT DETECTED Final   Pseudomonas aeruginosa NOT DETECTED NOT DETECTED Final   Stenotrophomonas maltophilia NOT DETECTED NOT DETECTED Final   Candida albicans NOT DETECTED NOT DETECTED Final   Candida auris NOT DETECTED NOT DETECTED Final    Candida glabrata NOT DETECTED NOT DETECTED Final   Candida krusei NOT DETECTED NOT DETECTED Final   Candida parapsilosis NOT DETECTED NOT DETECTED Final   Candida tropicalis NOT DETECTED NOT DETECTED Final   Cryptococcus neoformans/gattii NOT DETECTED NOT DETECTED Final   Meth resistant mecA/C and MREJ NOT DETECTED NOT DETECTED Final    Comment: Performed at [facility], [address] [place], [place] [address]  Culture, blood (Routine X 2) w Reflex to ID Panel     Status: Abnormal (Preliminary result)   Collection Time: 10/19/23 10:34 PM   Specimen: BLOOD LEFT HAND  Result Value Ref Range Status   Specimen Description BLOOD LEFT HAND  Final   Special Requests   Final    BOTTLES DRAWN AEROBIC AND ANAEROBIC Blood Culture adequate volume   Culture (A)  Final    BACILLUS SPECIES Standardized susceptibility testing for this organism is not available. CULTURE REINCUBATED FOR BETTER GROWTH CRITICAL RESULT CALLED TO, READ BACK BY AND VERIFIED WITH: PHARMD J MILLEN [id number] AT 742 AM BY CM Performed at [facility], [address], [place], [place] [address]    Report Status PENDING  Incomplete  Culture, blood (Routine X 2) w Reflex to ID Panel     Status: None (Preliminary result)   Collection Time: 10/22/23  9:33 AM   Specimen: BLOOD LEFT ARM  Result Value Ref Range Status   Specimen Description BLOOD LEFT ARM  Final   Special Requests   Final    BOTTLES DRAWN AEROBIC AND ANAEROBIC Blood Culture adequate volume   Culture   Final    NO GROWTH < 24 HOURS Performed at [facility], [address], [place], [place] [address]    Report Status PENDING  Incomplete  Culture, blood (Routine X 2) w Reflex to ID Panel     Status: None (Preliminary result)   Collection Time: 10/22/23  9:41 AM   Specimen: BLOOD LEFT ARM  Result Value Ref Range Status   Specimen Description BLOOD LEFT ARM  Final   Special Requests   Final    BOTTLES DRAWN AEROBIC AND ANAEROBIC Blood Culture  adequate volume   Culture   Final    NO GROWTH < 24 HOURS Performed at [facility], [address], [place], [place] [address]    Report Status PENDING  Incomplete    Studies/Results: VAS US  UPPER EXTREMITY VENOUS DUPLEX Result Date: 10/23/2023 UPPER VENOUS STUDY  Patient Name:  James Chan  Date of Exam:   10/23/2023 Medical Rec #: [id number]         Accession #:    [id number] Date of Birth: 09-22-1950        Patient Gender: M Patient Age:   73 years Exam Location:  [facility] Procedure:      VAS US  UPPER EXTREMITY VENOUS DUPLEX Referring Phys: Mana Morison VAN DAM --------------------------------------------------------------------------------  Indications: Phlebitis Performing Technologist: Elmarie Lindau, RVT  Examination Guidelines: A complete evaluation includes B-mode imaging, spectral Doppler, color Doppler, and power Doppler as needed of all accessible portions of each vessel. Bilateral testing is considered an integral part of a complete examination. Limited examinations for reoccurring indications may be performed as noted.  Right Findings: +----------+------------+---------+-----------+----------+-------+ RIGHT     CompressiblePhasicitySpontaneousPropertiesSummary +----------+------------+---------+-----------+----------+-------+ Subclavian    Full                                          +----------+------------+---------+-----------+----------+-------+  Left Findings: +----------+------------+---------+-----------+----------+-------+ LEFT      CompressiblePhasicitySpontaneousPropertiesSummary +----------+------------+---------+-----------+----------+-------+ IJV           Full                                          +----------+------------+---------+-----------+----------+-------+ Subclavian    Full                                          +----------+------------+---------+-----------+----------+-------+ Axillary      Full                                           +----------+------------+---------+-----------+----------+-------+ Brachial      Full                                          +----------+------------+---------+-----------+----------+-------+ Radial        Full                                          +----------+------------+---------+-----------+----------+-------+ Ulnar         Full                                          +----------+------------+---------+-----------+----------+-------+  Cephalic                                            absent  +----------+------------+---------+-----------+----------+-------+ Basilic       Full                                          +----------+------------+---------+-----------+----------+-------+  Summary:  Right: No evidence of thrombosis in the subclavian.  Left: No evidence of deep vein thrombosis in the upper extremity.  *See table(s) above for measurements and observations.    Preliminary       Assessment/Plan:  INTERVAL HISTORY: repeat blood cultures NTGD   Principal Problem:   Coronary artery disease involving native coronary artery of native heart with unstable angina pectoris (HCC)    James Chan is a 73 y.o. male with disease on hemodialysis brachial AV fistula peripheral vascular disease status post stents coronary disease V. tach V-fib with subcutaneous ICD placement on 23rd after about a month on cardiac catheter that showed two-vessel disease.  Patient underwent successful PCI of LAD and diagonal bifurcation orbital arthrectomy both vessels.  During hospitalization he developed phlebitis at site of peripheral IV and became febrile.  Blood cultures taken from the left hand have grown bacillus species in 2 cultures as well as a Staphylococcus aureus  Patient had been on cefazolin  for MSSA that was ID by [employer] we repeated the blood cultures were switching to IV vancomycin  and there is no growth to  date  Duplex is negative for DVT  #1 Bacillus and MSSA baccteremia:  Would continue vancomycin  and plan on 2 weeks of vancomycin  with day #1 being yesterday and stop date being the 13th.   The vancomycin  can be given with HD  I have personally spent 50 minutes involved in face-to-face and non-face-to-face activities for this patient on the day of the visit. Professional time spent includes the following activities: Preparing to see the patient (review of tests), Obtaining and/or reviewing separately obtained history (admission/discharge record), Performing a medically appropriate examination and/or evaluation , Ordering medications/tests/procedures, referring and communicating with other health care professionals, Documenting clinical information in the EMR, Independently interpreting results (not separately reported), Communicating results to the patient/family/caregiver, Counseling and educating the patient/family/caregiver and Care coordination (not separately reported).   Evaluation of the patient requires complex antimicrobial therapy evaluation, counseling , isolation needs to reduce disease transmission and risk assessment and mitigation.     LOS: 9 days   James Chan 10/23/2023, 3:05 PM

## 2023-10-23 NOTE — Progress Notes (Addendum)
 Progress Note  Patient Name: James Chan Date of Encounter: 10/23/2023 [facility] Cardiologist: Redell Leiter, MD   Interval Summary    No chest pain, was able to walk with PT yesterday.   Vital Signs Vitals:   10/22/23 1932 10/22/23 2335 10/23/23 0251 10/23/23 0732  BP: (!) 120/55 (!) 132/54 (!) 132/56 (!) 141/56  Pulse: 61 (!) 52 (!) 55 60  Resp: 20 18 20 17   Temp: 98.1 F (36.7 C) 98 F (36.7 C) 98.2 F (36.8 C) 97.9 F (36.6 C)  TempSrc: Oral Oral Oral Oral  SpO2: 90% 96% 99% 100%  Weight:      Height:        Intake/Output Summary (Last 24 hours) at 10/23/2023 0852 Last data filed at 10/22/2023 1850 Gross per 24 hour  Intake 640 ml  Output 1000 ml  Net -360 ml      10/22/2023    2:06 PM 10/22/2023   10:36 AM 10/19/2023    7:04 PM  Last 3 Weights  Weight (lbs) 151 lb 10.8 oz 153 lb 151 lb 0.2 oz  Weight (kg) 68.8 kg 69.4 kg 68.5 kg      Telemetry/ECG   Sinus Bradycardia - Personally Reviewed  Physical Exam  GEN: No acute distress.   Neck: No JVD Cardiac: RRR, soft systolic murmur, no rubs, or gallops.  Respiratory: Clear to auscultation bilaterally. GI: Soft, nontender, non-distended  MS: No edema. Right AVF  Assessment & Plan   73 yo male with PMH of supine hypertension, orthostatic hypotension, diabetes mellitus with stage V chronic kidney disease presently on hemodialysis via right brachial AV fistula, hypercholesterolemia, PAD with right SFA stenting, LAD stenting in 2023, CAD presenting during COVID with recurrent VT VF, cardiac catheterization on 01/15/2020 revealing occluded RCA and distal circumflex stents that were placed remotely, moderate disease in left main and proximal LAD with LAD to RCA collaterals, SP subcutaneous [employer] ICD placement recently seen by Dr. Inocencio on 10/08/2023 with recurrent ICD discharge due to recurrent VT VF requiring 6 shocks on 10/05/2023 who was seen in the office on 6/23 with recommendations for  outpatient cardiac cath.    CAD -- Underwent cardiac cath 6/23 with severe multivessel CAD including complex proximal LAD/D1 lesion.  Recommendations for surgery consult.  Seen by Dr. Lucas felt would not be a good candidate for CABG given his comorbidities with recommendations for high risk PCI.  Underwent staged intervention 6/25 with successful high risk PCI of LAD, diagonal bifurcation with orbital atherectomy of both vessels and cutting balloon angioplasty.  Procedure not done with Impella support due to inability to pass Impella at the left common iliac and abdominal aortic aneurysm.  Recommendations DAPT with aspirin /Brilinta . 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 Available data reviewed. Agree with findings, assessment, and plan as outlined by Manuelita Rummer, NP.  The patient is independently interviewed and examined.  HEENT is normal, JVP is normal, lungs are clear bilaterally, heart is regular rate and rhythm with 2/6 ejection murmur at the left upper sternal border,  abdomen is soft and nontender, extremities have no edema.  Patient's history is reviewed.  I reviewed his cardiac catheterization and PCI films.  He had a very nice result with bifurcation stenting of the LAD and diagonal branches.  Primary issue now is bacteremia.  Appreciate ID consultation and management.  It appears that he will need IV antibiotics coordinated with his outpatient dialysis.  The patient has some transportation issues as his wife does not drive.  He will need 24 hours notice on when he will be discharged.  I suspect he could be discharged tomorrow if his antibiotics can be coordinated.  He appears medically stable and approaching readiness for discharge.  Otherwise, as outlined above.  James Chan, M.D. 10/23/2023 9:02 AM

## 2023-10-23 NOTE — Progress Notes (Signed)
 [facility] Progress Note   Subjective:    Minimal complaints today. Wants to go home soon. No issues with HD  Objective Vitals:   10/22/23 1932 10/22/23 2335 10/23/23 0251 10/23/23 0732  BP: (!) 120/55 (!) 132/54 (!) 132/56 (!) 141/56  Pulse: 61 (!) 52 (!) 55 60  Resp: 20 18 20 17   Temp: 98.1 F (36.7 C) 98 F (36.7 C) 98.2 F (36.8 C) 97.9 F (36.6 C)  TempSrc: Oral Oral Oral Oral  SpO2: 90% 96% 99% 100%  Weight:      Height:       Physical Exam General: Lying bed, nad Heart: bradycardia, no rub Lungs: Bilateral chest rise with no increased work of breathing Abdomen: soft, non tender Extremities: no LE edema Dialysis Access: R AVF +T/B  Filed Weights   10/19/23 1904 10/22/23 1036 10/22/23 1406  Weight: 68.5 kg 69.4 kg 68.8 kg    Intake/Output Summary (Last 24 hours) at 10/23/2023 0950 Last data filed at 10/22/2023 1850 Gross per 24 hour  Intake 640 ml  Output 1000 ml  Net -360 ml    Additional Objective Labs: Basic Metabolic Panel: Recent Labs  Lab 10/17/23 0418 10/17/23 1552 10/19/23 0218 10/20/23 1633 10/21/23 0311 10/22/23 0234 10/23/23 0354  NA 128*   < > 126*   < > 126* 128* 130*  K 5.1   < > 5.7*   < > 4.3 4.6 4.0  CL 94*   < > 89*   < > 90* 90* 92*  CO2 22   < > 22   < > 27 24 27   GLUCOSE 90   < > 148*   < > 95 73 96  BUN 39*   < > 64*   < > 46* 53* 29*  CREATININE 4.00*   < > 6.05*   < > 5.66* 6.39* 4.68*  CALCIUM  8.7*   < > 9.1   < > 8.6* 8.8* 8.4*  PHOS 4.8*  --  7.0*  --   --  5.5*  --    < > = values in this interval not displayed.   Liver Function Tests: Recent Labs  Lab 10/21/23 0311 10/22/23 0234 10/23/23 0354  AST 74* 55* 58*  ALT 95* 56* 33  ALKPHOS 85 90 93  BILITOT 1.4* 1.1 1.3*  PROT 6.1* 5.9* 6.1*  ALBUMIN 2.7* 2.7* 2.6*   No results for input(s): LIPASE, AMYLASE in the last 168 hours. CBC: Recent Labs  Lab 10/19/23 2235 10/20/23 1633 10/21/23 0311 10/22/23 0234 10/23/23 0354  WBC 16.4*  14.2* 12.8* 12.4* 12.0*  NEUTROABS 14.5* 12.0* 10.9* 10.1* 9.5*  HGB 9.1* 9.3* 8.6* 9.0* 9.3*  HCT 26.3* 27.1* 25.0* 26.1* 27.6*  MCV 97.0 98.5 97.3 98.5 100.0  PLT 192 165 198 198 210   Blood Culture    Component Value Date/Time   SDES BLOOD LEFT ARM 10/22/2023 0941   SPECREQUEST  10/22/2023 0941    BOTTLES DRAWN AEROBIC AND ANAEROBIC Blood Culture adequate volume   CULT  10/22/2023 0941    NO GROWTH < 24 HOURS Performed at [facility], [address] [place], [place] [address]    REPTSTATUS PENDING 10/22/2023 [phone]    Cardiac Enzymes: No results for input(s): CKTOTAL, CKMB, CKMBINDEX, TROPONINI in the last 168 hours. CBG: Recent Labs  Lab 10/21/23 2059 10/22/23 0607 10/22/23 1606 10/22/23 2123 10/23/23 0621  GLUCAP 90 121* 82 130* 91   Iron Studies:  Recent Labs  10/22/23 1100  IRON 156  TIBC 168*  FERRITIN >7,500*   Lab Results  Component Value Date   INR 1.1 01/14/2020   Studies/Results: No results found.  Medications:  [START ON 10/24/2023] vancomycin       allopurinol   100 mg Oral q1800   amiodarone   200 mg Oral Daily   aspirin  EC  81 mg Oral Daily   atorvastatin   80 mg Oral Daily   Chlorhexidine  Gluconate Cloth  6 each Topical Q0600   heparin   5,000 Units Subcutaneous Q8H   insulin  aspart  0-6 Units Subcutaneous TID WC   latanoprost   1 drop Both Eyes QHS   mexiletine  250 mg Oral BID   midodrine   15 mg Oral Q M,W,F   pyridoxine   100 mg Oral Q1500   sodium chloride  flush  3 mL Intravenous Q12H   sodium chloride  flush  3 mL Intravenous Q12H   ticagrelor   90 mg Oral BID   torsemide   100 mg Oral Once per day on Sunday Tuesday Thursday Saturday   traZODone   50 mg Oral QHS    Dialysis Orders: MWF - [facility] 3:30hr, 400/A1.5, EDW 67.3kg, 2K/2.5Ca, UFP #2, AVF, no heparin  - No ESA - Calcitriol  0.75mcg PO q HD - Sensipar 30mg  PO q HD  Assessment/Plan: CAD/Unstable angina. Cardiology following, LHC 6/23 with severe multivessel disease.  CTS consulted, poor candidate for surgery. Underwent cardiac cath 6/25 with stents to LAD.  Post procedure groin hematoma. Per cardiology.   Hx VT/VF.  Unstable recently. ICD in place. On amio and mexiletine ESRD:  HD MWF.  Hypotension/CHF: BP soft post procedure. Off Coreg . Midodrine  added TID.  Low EF with right-sided systolic dysfunction.  Goal-directed therapy limited by blood pressure Anemia: Hgb around 9. Iron sufficient. Also no iron with bacteremia. Continue with ESA and adjust if needed  Metabolic bone disease: Phos now at goal Bacteremia: bacillus and MSSA. Vanc for two weeks. Can get at HD  T2DM  HFrEF, severe CAD  COPD   [facility] 10/23/2023,9:50 AM  LOS: 9 days

## 2023-10-23 NOTE — Progress Notes (Signed)
 PHARMACY CONSULT NOTE FOR:  OUTPATIENT  PARENTERAL ANTIBIOTIC THERAPY WITH DIALYSIS   Indication: MSSA/Bacillus Bacteremia Regimen: Vancomycin  750 mg IV with HD MWF  End date: 11/04/23  No formal OPAT will be done as patient will receive antibiotics with HD. Nephrology aware. Informational order placed on discharge tab.   Thank you for allowing pharmacy to be a part of this patient's care.  Damien Quiet, PharmD, BCPS, BCIDP Infectious Diseases Clinical Pharmacist Phone: [phone] 10/23/2023, 3:17 PM

## 2023-10-23 NOTE — Progress Notes (Signed)
 Physical Therapy Treatment Patient Details Name: James Chan MRN: [id number] DOB: [birth year] Today's Date: 10/23/2023   History of Present Illness Pt is a 73 y/o male admitted 6/22 via EMS with recurrent CP starting 2 days prior to admission requiring nitroglycerin  use.  Pt s/p L heart Cath 6/25, developed a large groin hematoma which has been addressed.  PMH  Vfib, AAA, ASHD, HTN, cardiac arrest CHF, DM, ESRDGERD, Glaucoma, ICD implantation, NSTEMI, PAD, sleep apnea, stroke, USA , R/L knee sx    PT Comments  Pt admitted with above diagnosis. Pt was able to ambulate with rollator with good safety awareness and pt likes option to sit and rest prn.  Pt without LOB as well.  Pt given exercise program for home and education regarding exercise program.  Issued gait belt if needed.  Pt reports he doesn't have steps the way he goes into his home. Pt may benefit from cardiac rehab phase 2 on d/c.  Will continue to follow acutely.  Pt currently with functional limitations due to the deficits listed below (see PT Problem List). Pt will benefit from acute skilled PT to increase their independence and safety with mobility to allow discharge.       If plan is discharge home, recommend the following: Help with stairs or ramp for entrance;Assistance with cooking/housework;A little help with walking and/or transfers;A little help with bathing/dressing/bathroom   Can travel by private Psychologist, clinical (4 wheels)    Recommendations for Other Services       Precautions / Restrictions Precautions Precautions: Fall Recall of Precautions/Restrictions: Intact Precaution/Restrictions Comments: Limited push/pull to R UE Restrictions Weight Bearing Restrictions Per Provider Order: No     Mobility  Bed Mobility Overal bed mobility: Needs Assistance Bed Mobility: Supine to Sit, Sit to Supine   Sidelying to sit: Modified independent (Device/Increase time)             Transfers Overall transfer level: Needs assistance   Transfers: Sit to/from Stand Sit to Stand: Contact guard assist                Ambulation/Gait Ambulation/Gait assistance: Contact guard assist Gait Distance (Feet): 400 Feet Assistive device: Rollator (4 wheels) Gait Pattern/deviations: Step-through pattern   Gait velocity interpretation: <1.8 ft/sec, indicate of risk for recurrent falls   General Gait Details: slower cadence but steady overall with rollator. Pt likes rollator as he can rest when he wanted to. Demonstrates appropriate use of brakes.   Stairs             Wheelchair Mobility     Tilt Bed    Modified Rankin (Stroke Patients Only)       Balance Overall balance assessment: Needs assistance Sitting-balance support: No upper extremity supported, Feet supported Sitting balance-Leahy Scale: Good     Standing balance support: Reliant on assistive device for balance, Bilateral upper extremity supported Standing balance-Leahy Scale: Poor Standing balance comment:  (Pt relies on rollator for balance)                            Communication Communication Communication: No apparent difficulties  Cognition Arousal: Alert Behavior During Therapy: WFL for tasks assessed/performed   PT - Cognitive impairments: No apparent impairments                         Following commands: Intact  Cueing Cueing Techniques: Verbal cues  Exercises General Exercises - Lower Extremity Long Arc Quad: AROM, Both, 10 reps, Seated Heel Slides: AROM, Both, 10 reps, Supine Hip ABduction/ADduction: AROM, Both, 10 reps, Supine Hip Flexion/Marching: AROM, Both, 10 reps, Standing Heel Raises: AROM, Both, 10 reps, Standing Mini-Sqauts: AROM, Both, 10 reps, Standing Other Exercises Other Exercises: Issued [employer] exercise program - [id number]    General Comments General comments (skin integrity, edema, etc.): VSS on RA, issued gait belt  to pt      Pertinent Vitals/Pain Pain Assessment Pain Assessment: No/denies pain Faces Pain Scale: No hurt    Home Living                          Prior Function            PT Goals (current goals can now be found in the care plan section) Acute Rehab PT Goals Patient Stated Goal: get home soon Progress towards PT goals: Progressing toward goals    Frequency    Min 2X/week      PT Plan      Co-evaluation              AM-PAC PT 6 Clicks Mobility   Outcome Measure  Help needed turning from your back to your side while in a flat bed without using bedrails?: None Help needed moving from lying on your back to sitting on the side of a flat bed without using bedrails?: A Little Help needed moving to and from a bed to a chair (including a wheelchair)?: A Little Help needed standing up from a chair using your arms (e.g., wheelchair or bedside chair)?: A Little Help needed to walk in hospital room?: A Little Help needed climbing 3-5 steps with a railing? : A Little 6 Click Score: 19    End of Session Equipment Utilized During Treatment: Gait belt Activity Tolerance: Patient tolerated treatment well Patient left: with call bell/phone within reach;in chair Nurse Communication: Mobility status PT Visit Diagnosis: Unsteadiness on feet (R26.81);Other abnormalities of gait and mobility (R26.89)     Time: 8981-8952 PT Time Calculation (min) (ACUTE ONLY): 29 min  Charges:    $Gait Training: 8-22 mins $Therapeutic Exercise: 8-22 mins PT General Charges $$ ACUTE PT VISIT: 1 Visit                     Ilsa Bonello M,PT Acute Rehab Services [phone]    Stephane JULIANNA Bevel 10/23/2023, 12:36 PM

## 2023-10-23 NOTE — Plan of Care (Signed)
  Problem: Activity: Goal: Ability to tolerate increased activity will improve Outcome: Progressing   Problem: Education: Goal: Knowledge of General Education information will improve Description: Including pain rating scale, medication(s)/side effects and non-pharmacologic comfort measures Outcome: Progressing   Problem: Health Behavior/Discharge Planning: Goal: Ability to manage health-related needs will improve Outcome: Progressing   Problem: Clinical Measurements: Goal: Ability to maintain clinical measurements within normal limits will improve Outcome: Progressing Goal: Respiratory complications will improve Outcome: Progressing   Problem: Pain Managment: Goal: General experience of comfort will improve and/or be controlled Outcome: Progressing

## 2023-10-23 NOTE — Progress Notes (Signed)
 LUE venous exam completed. Shaheed Schmuck, RVT

## 2023-10-24 ENCOUNTER — Other Ambulatory Visit (HOSPITAL_COMMUNITY): Payer: Self-pay

## 2023-10-24 DIAGNOSIS — Y633 Inadvertent exposure of patient to radiation during medical care: Secondary | ICD-10-CM

## 2023-10-24 HISTORY — DX: Inadvertent exposure of patient to radiation during medical care: Y63.3

## 2023-10-24 LAB — COMPREHENSIVE METABOLIC PANEL WITH GFR
ALT: 19 U/L (ref 0–44)
AST: 51 U/L — ABNORMAL HIGH (ref 15–41)
Albumin: 2.7 g/dL — ABNORMAL LOW (ref 3.5–5.0)
Alkaline Phosphatase: 98 U/L (ref 38–126)
Anion gap: 12 (ref 5–15)
BUN: 30 mg/dL — ABNORMAL HIGH (ref 8–23)
CO2: 27 mmol/L (ref 22–32)
Calcium: 8.9 mg/dL (ref 8.9–10.3)
Chloride: 91 mmol/L — ABNORMAL LOW (ref 98–111)
Creatinine, Ser: 5.61 mg/dL — ABNORMAL HIGH (ref 0.61–1.24)
GFR, Estimated: 10 mL/min — ABNORMAL LOW (ref >60)
Glucose, Bld: 87 mg/dL (ref 70–99)
Potassium: 4.4 mmol/L (ref 3.5–5.1)
Sodium: 130 mmol/L — ABNORMAL LOW (ref 135–145)
Total Bilirubin: 1.2 mg/dL (ref 0.0–1.2)
Total Protein: 6.1 g/dL — ABNORMAL LOW (ref 6.5–8.1)

## 2023-10-24 LAB — CBC WITH DIFFERENTIAL/PLATELET
Abs Immature Granulocytes: 0.7 10*3/uL — ABNORMAL HIGH (ref 0.00–0.07)
Basophils Absolute: 0.1 10*3/uL (ref 0.0–0.1)
Basophils Relative: 1 %
Eosinophils Absolute: 0.4 10*3/uL (ref 0.0–0.5)
Eosinophils Relative: 3 %
HCT: 28.5 % — ABNORMAL LOW (ref 39.0–52.0)
Hemoglobin: 9.7 g/dL — ABNORMAL LOW (ref 13.0–17.0)
Immature Granulocytes: 5 %
Lymphocytes Relative: 6 %
Lymphs Abs: 0.8 10*3/uL (ref 0.7–4.0)
MCH: 34 pg (ref 26.0–34.0)
MCHC: 34 g/dL (ref 30.0–36.0)
MCV: 100 fL (ref 80.0–100.0)
Monocytes Absolute: 1.2 10*3/uL — ABNORMAL HIGH (ref 0.1–1.0)
Monocytes Relative: 9 %
Neutro Abs: 10.2 10*3/uL — ABNORMAL HIGH (ref 1.7–7.7)
Neutrophils Relative %: 76 %
Platelets: 194 10*3/uL (ref 150–400)
RBC: 2.85 MIL/uL — ABNORMAL LOW (ref 4.22–5.81)
RDW: 15.9 % — ABNORMAL HIGH (ref 11.5–15.5)
WBC: 13.4 10*3/uL — ABNORMAL HIGH (ref 4.0–10.5)
nRBC: 0.3 % — ABNORMAL HIGH (ref 0.0–0.2)

## 2023-10-24 LAB — CULTURE, BLOOD (ROUTINE X 2): Special Requests: ADEQUATE

## 2023-10-24 LAB — HCV INTERPRETATION

## 2023-10-24 LAB — GLUCOSE, CAPILLARY: Glucose-Capillary: 87 mg/dL (ref 70–99)

## 2023-10-24 LAB — HCV AB W REFLEX TO QUANT PCR: HCV Ab: NONREACTIVE

## 2023-10-24 MED ORDER — ATORVASTATIN CALCIUM 80 MG PO TABS
80.0000 mg | ORAL_TABLET | Freq: Every day | ORAL | 1 refills | Status: DC
  Filled 2023-10-24: qty 90, 90d supply, fill #0

## 2023-10-24 MED ORDER — TICAGRELOR 90 MG PO TABS
90.0000 mg | ORAL_TABLET | Freq: Two times a day (BID) | ORAL | 2 refills | Status: DC
  Filled 2023-10-24: qty 180, 90d supply, fill #0

## 2023-10-24 MED ORDER — ONDANSETRON HCL 4 MG PO TABS
4.0000 mg | ORAL_TABLET | Freq: Every day | ORAL | 0 refills | Status: DC | PRN
  Filled 2023-10-24: qty 15, 15d supply, fill #0

## 2023-10-24 NOTE — Progress Notes (Signed)
 [facility] Progress Note   Subjective:    Patient continues to have some nausea but otherwise no complaints.  Wants to go home today.  Arrange dialysis in the outpatient setting.  Objective Vitals:   10/23/23 1923 10/23/23 2352 10/24/23 0406 10/24/23 0752  BP: (!) 118/57 (!) 121/54 (!) 143/47 (!) 144/61  Pulse: 64 60 (!) 58   Resp: 16 14 15 17   Temp: 98.3 F (36.8 C) 98.1 F (36.7 C) 97.6 F (36.4 C) 97.7 F (36.5 C)  TempSrc: Oral Oral Oral Oral  SpO2: 96% 91% 98% 98%  Weight:      Height:       Physical Exam General: Lying bed, nad Heart: bradycardia, no rub Lungs: Bilateral chest rise with no increased work of breathing Abdomen: soft, non tender Extremities: no LE edema Dialysis Access: R AVF +T/B  Filed Weights   10/19/23 1904 10/22/23 1036 10/22/23 1406  Weight: 68.5 kg 69.4 kg 68.8 kg    Intake/Output Summary (Last 24 hours) at 10/24/2023 1110 Last data filed at 10/24/2023 0840 Gross per 24 hour  Intake 600 ml  Output 150 ml  Net 450 ml    Additional Objective Labs: Basic Metabolic Panel: Recent Labs  Lab 10/19/23 0218 10/20/23 1633 10/22/23 0234 10/23/23 0354 10/24/23 0542  NA 126*   < > 128* 130* 130*  K 5.7*   < > 4.6 4.0 4.4  CL 89*   < > 90* 92* 91*  CO2 22   < > 24 27 27   GLUCOSE 148*   < > 73 96 87  BUN 64*   < > 53* 29* 30*  CREATININE 6.05*   < > 6.39* 4.68* 5.61*  CALCIUM  9.1   < > 8.8* 8.4* 8.9  PHOS 7.0*  --  5.5*  --   --    < > = values in this interval not displayed.   Liver Function Tests: Recent Labs  Lab 10/22/23 0234 10/23/23 0354 10/24/23 0542  AST 55* 58* 51*  ALT 56* 33 19  ALKPHOS 90 93 98  BILITOT 1.1 1.3* 1.2  PROT 5.9* 6.1* 6.1*  ALBUMIN 2.7* 2.6* 2.7*   No results for input(s): LIPASE, AMYLASE in the last 168 hours. CBC: Recent Labs  Lab 10/20/23 1633 10/21/23 0311 10/22/23 0234 10/23/23 0354 10/24/23 0542  WBC 14.2* 12.8* 12.4* 12.0* 13.4*  NEUTROABS 12.0* 10.9* 10.1* 9.5* 10.2*   HGB 9.3* 8.6* 9.0* 9.3* 9.7*  HCT 27.1* 25.0* 26.1* 27.6* 28.5*  MCV 98.5 97.3 98.5 100.0 100.0  PLT 165 198 198 210 194   Blood Culture    Component Value Date/Time   SDES BLOOD LEFT ARM 10/22/2023 0941   SPECREQUEST  10/22/2023 0941    BOTTLES DRAWN AEROBIC AND ANAEROBIC Blood Culture adequate volume   CULT  10/22/2023 0941    NO GROWTH 2 DAYS Performed at [facility], [address], [place], [place] [address]    REPTSTATUS PENDING 10/22/2023 [phone]    Cardiac Enzymes: No results for input(s): CKTOTAL, CKMB, CKMBINDEX, TROPONINI in the last 168 hours. CBG: Recent Labs  Lab 10/23/23 0621 10/23/23 1108 10/23/23 1601 10/23/23 2107 10/24/23 0606  GLUCAP 91 95 110* 129* 87   Iron Studies:  Recent Labs    10/22/23 1100  IRON 156  TIBC 168*  FERRITIN >7,500*   Lab Results  Component Value Date   INR 1.1 01/14/2020   Studies/Results: VAS US  UPPER EXTREMITY VENOUS DUPLEX Result Date: 10/23/2023 UPPER VENOUS STUDY  Patient Name:  QUANTARIUS GENRICH  Date of Exam:   10/23/2023 Medical Rec #: [id number]         Accession #:    [id number] Date of Birth: [birth year]        Patient Gender: M Patient Age:   73 years Exam Location:  [facility] Procedure:      VAS US  UPPER EXTREMITY VENOUS DUPLEX Referring Phys: CORNELIUS VAN DAM --------------------------------------------------------------------------------  Indications: Phlebitis Performing Technologist: Elmarie Lindau, RVT  Examination Guidelines: A complete evaluation includes B-mode imaging, spectral Doppler, color Doppler, and power Doppler as needed of all accessible portions of each vessel. Bilateral testing is considered an integral part of a complete examination. Limited examinations for reoccurring indications may be performed as noted.  Right Findings: +----------+------------+---------+-----------+----------+-------+ RIGHT     CompressiblePhasicitySpontaneousPropertiesSummary  +----------+------------+---------+-----------+----------+-------+ Subclavian    Full                                          +----------+------------+---------+-----------+----------+-------+  Left Findings: +----------+------------+---------+-----------+----------+-------+ LEFT      CompressiblePhasicitySpontaneousPropertiesSummary +----------+------------+---------+-----------+----------+-------+ IJV           Full                                          +----------+------------+---------+-----------+----------+-------+ Subclavian    Full                                          +----------+------------+---------+-----------+----------+-------+ Axillary      Full                                          +----------+------------+---------+-----------+----------+-------+ Brachial      Full                                          +----------+------------+---------+-----------+----------+-------+ Radial        Full                                          +----------+------------+---------+-----------+----------+-------+ Ulnar         Full                                          +----------+------------+---------+-----------+----------+-------+ Cephalic                                            absent  +----------+------------+---------+-----------+----------+-------+ Basilic       Full                                          +----------+------------+---------+-----------+----------+-------+  Summary:  Right: No evidence of thrombosis in the subclavian.  Left: No evidence of deep vein thrombosis in the upper extremity.  *See table(s) above for measurements and observations.  Diagnosing physician: Debby Robertson Electronically signed by Debby Robertson on 10/23/2023 at 9:29:15 PM.    Final     Medications:  vancomycin       allopurinol   100 mg Oral q1800   amiodarone   200 mg Oral Daily   aspirin  EC  81 mg Oral Daily   atorvastatin   80 mg  Oral Daily   Chlorhexidine  Gluconate Cloth  6 each Topical Q0600   heparin   5,000 Units Subcutaneous Q8H   insulin  aspart  0-6 Units Subcutaneous TID WC   latanoprost   1 drop Both Eyes QHS   mexiletine  250 mg Oral BID   midodrine   15 mg Oral Q M,W,F   pyridoxine   100 mg Oral Q1500   ticagrelor   90 mg Oral BID   torsemide   100 mg Oral Once per day on Sunday Tuesday Thursday Saturday   traZODone   50 mg Oral QHS    Dialysis Orders: MWF - [facility] 3:30hr, 400/A1.5, EDW 67.3kg, 2K/2.5Ca, UFP #2, AVF, no heparin  - No ESA - Calcitriol  0.75mcg PO q HD - Sensipar 30mg  PO q HD  Assessment/Plan: CAD/Unstable angina. Cardiology following, LHC 6/23 with severe multivessel disease. CTS consulted, poor candidate for surgery. Underwent cardiac cath 6/25 with stents to LAD.  Post procedure groin hematoma. Stable at this time Hx VT/VF.  Unstable recently. ICD in place. On amio and mexiletine.  Improved ESRD:  HD MWF. Patient will get dialysis on Thursday in the outpatient setting.  We contacted his clinic and confirmed Hypotension/CHF: BP soft post procedure. Off Coreg . Midodrine  added TID.  Low EF with right-sided systolic dysfunction.  Goal-directed therapy limited by blood pressure Anemia: Hgb around 9. Iron sufficient. Also no iron with bacteremia. Continue with ESA and adjust if needed  Metabolic bone disease: Phos now at goal Bacteremia: bacillus and MSSA. Vanc for two weeks. Can get at HD  T2DM  HFrEF, severe CAD  COPD   [facility] 10/24/2023,11:10 AM  LOS: 10 days

## 2023-10-24 NOTE — Progress Notes (Signed)
 D/C noted. Contacted renal PA and nephrologist regarding pt's d/c today and today being pt's normal HD day. Advised that nephrologist contacted [facility] to confirm with clinic staff that pt can be treated tomorrow. Plans are for pt to treat at out-pt clinic tomorrow and renal PA to send orders to clinic for iv abx at d/c. Clinic FA confirms staff will contact pt with appt time for tomorrow.   Randine Mungo Renal Navigator [phone]

## 2023-10-24 NOTE — Discharge Planning (Addendum)
 [facility] Patient Discharge Orders - [facility] CLINIC: [place]  Patient's name: James Chan Admit/DC Dates: 10/14/2023 - 10/24/2023  DISCHARGE DIAGNOSES: CAD with unstable angina. 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, CTS consulted felt too high risk for CABG, then underwent repeat LHC 6/25 with high risk complex PIC and stent to prox LAD at diagonal bifurcation R groin hematoma s/p above procedure - did not require intervention MSSA bacteremia: Blood Cx 6/27 positive, repeat Cx 6/30 negative  HD ORDER CHANGES: Heparin  change: no EDW Change: no Bath Change: no  ANEMIA MANAGEMENT: Aranesp : Given: yes   Amount/Date of last dose: 40mcg on 6/26 ESA dose for discharge: mircera 50 mcg IV q 2 weeks, to start on 7/3 IV Iron dose at discharge: hold off until done with IV abx Transfusion: Given: no  BONE/MINERAL MEDICATIONS: Hectorol/Calcitriol  change: no Sensipar/Parsabiv change: no  ACCESS INTERVENTION/CHANGE: no Details:  RECENT LABS: Recent Labs  Lab 10/22/23 0234 10/23/23 0354 10/24/23 0542  HGB 9.0*   < > 9.7*  NA 128*   < > 130*  K 4.6   < > 4.4  CALCIUM  8.8*   < > 8.9  PHOS 5.5*  --   --   ALBUMIN 2.7*   < > 2.7*   < > = values in this interval not displayed.    IV ANTIBIOTICS: YES - Vancomycin  750mg  IV q HD until 11/04/23 Details:  OTHER ANTICOAGULATION: On Coumadin?: no Will be on ASA, brillinta  OTHER/APPTS/LAB ORDERS: - Will be coming for HD tomorrow (7/3) off schedule  D/C Meds to be reconciled by nurse after every discharge.  Completed By: Izetta Boehringer, PA-C [facility] Pager [phone]   Reviewed by: MD:______ RN_______

## 2023-10-24 NOTE — Plan of Care (Signed)

## 2023-10-24 NOTE — Discharge Summary (Addendum)
 Discharge Summary   Patient ID: James Chan MRN: [id number]; DOB: [birth year]  Admit date: 10/14/2023 Discharge date: 10/24/2023  PCP:  [name], MD   [facility] Providers Cardiologist:  Redell Leiter, MD  Electrophysiologist:  Will Gladis Norton, MD    Discharge Diagnoses  Principal Problem:   Coronary artery disease involving native coronary artery of native heart with unstable angina pectoris [facility]) Active Problems:   HFrEF (heart failure with reduced ejection fraction) (HCC)   S/P ICD (internal cardiac defibrillator) procedure 01/15/20 [facility] scientific    End stage renal disease on dialysis [facility])   S/P coronary artery stent placement   MSSA bacteremia   Phlebitis   Polymicrobial bacterial infection   Inadvertent exposure of patient to radiation during medical care  Diagnostic Studies/Procedures   Cath: 10/15/2023  Conclusions: Severe multivessel coronary artery disease, including 40% distal LMCA stenosis, 90% proximal LAD stenosis involving large D1 branch with heavy calcification, sequential 60% and 100% mid and distal LCx stenoses, and chronic total occlusion of ostial through distal RCA. Mildly-moderately elevated left heart filling pressures (LVEDP, PCWP 25 mmHg). Severely elevated right heart and pulmonary artery pressures (RA 16, PA 78/30, mean PAP 46 mmHg). Normal Fick cardiac output/index (CO 4.9 L/min, CI 2.7 L/min/m^2). Reduced thermodilution cardiac output/index (CO 3.8 L/min, CI 2.1 L/min/m^2). Calcified abdominal aorta with moderate luminal irregularities (distal aorta not well-visualized due to low cardiac output). Patent left common iliac stent with 30-40% in-stent restenosis.   Recommendations: Cardiac surgery consultation given significant multivessel disease and complex proximal LAD/D1 lesion.  If the patient is not a candidate for surgery, high-risk PCI to LAD/D1 versus palliative medical therapy will need to be considered. Resume heparin   8 hours after femoral hemostasis has been achieved. Proceed with hemodialysis today and optimize chronic HFrEF; consultation with advanced heart failure team may be helpful. Repeat echocardiogram to reassess LVEF and mitral regurgitation; prominent v-waves suggest significant mitral regurgitation. Aggressive secondary prevention of coronary artery disease.   Lonni Hanson, MD [facility]   Cath: 10/17/2023  1.  Right IJ Swan placement and right heart catheterization demonstrating Fick cardiac output of 7.24 L/min, Fick cardiac index of 4.0 L/min/m, thermodilution cardiac output of 3.8 L/min and thermodilution cardiac index of 2.1 L/min/m with the following hemodynamics:            Right atrial pressure mean of 16 mmHg            Right ventricular pressure 67/20 with an end-diastolic pressure of 19 mmHg            Wedge pressure mean of 23 mmHg with V waves to 33 mmHg            PA pressure of 68/31 with a mean of 30 mmHg            PVR of 2.1 Woods units by Fick; 3.9 Wood units by thermal dilution            PA pulsatility index of 2.3 2.  Successful high risk, complex PCI of LAD diagonal bifurcation using orbital atherectomy of both vessels, Cutting Balloon angioplasty of both vessels, and culotte technique.  Procedure was not performed with Impella support due to inability to pass the Impella through the left common iliac and abdominal aortic aneurysm (see procedural notes). 3.  LVEDP of 21 mmHg   Recommendations: The results were reviewed with the patient's family and Dr. Court, the 14 French indwelling sheath in the left common femoral artery will be  removed when the ACT is less than 160.  The patient be transferred to to heart unit for further monitoring.  Diagnostic Dominance: Right  Intervention    Echo: 10/16/2023  IMPRESSIONS     1. Left ventricular ejection fraction, by estimation, is <20%. The left  ventricle has severely decreased function. The left ventricle  demonstrates  global hypokinesis. The left ventricular internal cavity size was  moderately dilated. There is mild  concentric left ventricular hypertrophy. Left ventricular diastolic  parameters are consistent with Grade II diastolic dysfunction  (pseudonormalization).   2. Right ventricular systolic function is mildly reduced. The right  ventricular size is mildly enlarged. There is severely elevated pulmonary  artery systolic pressure.   3. Left atrial size was severely dilated.   4. Right atrial size was severely dilated.   5. The mitral valve is abnormal. Trivial mitral valve regurgitation. No  evidence of mitral stenosis.   6. Eccentric jet.   7. The aortic valve is tricuspid. There is moderate calcification of the  aortic valve. Aortic valve regurgitation is trivial. Aortic valve  sclerosis is present, with no evidence of aortic valve stenosis.   Comparison(s): Prior images reviewed side by side. Pericardial effusion  has decreased.   FINDINGS   Left Ventricle: Left ventricular ejection fraction, by estimation, is  <20%. The left ventricle has severely decreased function. The left  ventricle demonstrates global hypokinesis. The left ventricular internal  cavity size was moderately dilated. There  is mild concentric left ventricular hypertrophy. Left ventricular  diastolic parameters are consistent with Grade II diastolic dysfunction  (pseudonormalization).   Right Ventricle: The right ventricular size is mildly enlarged. No  increase in right ventricular wall thickness. Right ventricular systolic  function is mildly reduced. There is severely elevated pulmonary artery  systolic pressure. The tricuspid  regurgitant velocity is 3.85 m/s, and with an assumed right atrial  pressure of 8 mmHg, the estimated right ventricular systolic pressure is  67.3 mmHg.   Left Atrium: Left atrial size was severely dilated.   Right Atrium: Right atrial size was severely dilated.    Pericardium: Trivial pericardial effusion is present. The pericardial  effusion is anterior to the right ventricle.   Mitral Valve: The mitral valve is abnormal. Trivial mitral valve  regurgitation. No evidence of mitral valve stenosis.   Tricuspid Valve: Eccentric jet. The tricuspid valve is normal in  structure. Tricuspid valve regurgitation is mild . No evidence of  tricuspid stenosis.   Aortic Valve: The aortic valve is tricuspid. There is moderate  calcification of the aortic valve. Aortic valve regurgitation is trivial.  Aortic valve sclerosis is present, with no evidence of aortic valve  stenosis. Aortic valve mean gradient measures 3.0   mmHg. Aortic valve peak gradient measures 6.0 mmHg. Aortic valve area, by  VTI measures 1.17 cm.   Pulmonic Valve: The pulmonic valve was normal in structure. Pulmonic valve  regurgitation is trivial. No evidence of pulmonic stenosis.   Aorta: The aortic root and ascending aorta are structurally normal, with  no evidence of dilitation.   IAS/Shunts: The atrial septum is grossly normal.     _____________   History of Present Illness   James Chan is a 72 y.o. male with  history of ESRD, HTN, PAD, DMII, gout, VT/VF  HFrEF, and ICD.  Saw Dr Antonetta 10/08/23. ICD interrogation with 6 appropriate shocks for VT/VF though he was unaware. Had syncopal episode prior to admit. Progressive chest pain and admitted with unstable angina.  Hospital Course   Consultants: TRH, ID   CAD -- Underwent cardiac cath 6/23 with severe multivessel CAD including complex proximal LAD/D1 lesion.  Recommendations for surgery consult.  Seen by Dr. Lucas felt would not be a good candidate for CABG given his comorbidities with recommendations for high risk PCI.  Underwent staged intervention 6/25 with successful high risk PCI of LAD, diagonal bifurcation with orbital atherectomy of both vessels and cutting balloon angioplasty.  Procedure not done with Impella  support due to inability to pass Impella at the left common iliac and abdominal aortic aneurysm.  Recommendations DAPT with aspirin /Brilinta . 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 Verlin and deemed stable for discharge home. Follow up arranged in the office. Medications sent to the [facility] pharmacy and educated by pharmD prior to discharge.  Did the patient have an acute coronary syndrome (MI, NSTEMI, STEMI, etc) this admission?:  No                               Did the patient have a percutaneous coronary intervention (stent / angioplasty)?:  Yes.     Cath/PCI Registry Performance & Quality Measures: Aspirin  prescribed? - Yes ADP Receptor Inhibitor (Plavix /Clopidogrel , Brilinta /Ticagrelor  or Effient/Prasugrel) prescribed (includes medically managed patients)? - Yes High Intensity Statin (Lipitor 40-80mg  or Crestor 20-40mg ) prescribed? - Yes For EF <40%, was ACEI/ARB prescribed? - No - Reason:  renal disease For EF <40%, Aldosterone Antagonist (Spironolactone or Eplerenone) prescribed? - No - Reason:  renal disease Cardiac Rehab Phase II  ordered? - Yes     The patient will be scheduled for a TOC follow up appointment in 10-14 days.  A message has been sent to the [facility] and Scheduling Pool at the office where the patient should be seen for follow up.  _____________  Discharge Vitals Blood pressure (!) 144/61, pulse (!) 58, temperature 97.7 F (36.5 C), temperature source Oral, resp. rate 17, height 5' 6 (1.676 m), weight 68.8 kg, SpO2 98%.  Filed Weights   10/19/23 1904 10/22/23 1036 10/22/23 1406  Weight: 68.5 kg 69.4 kg 68.8 kg    Labs & Radiologic Studies  CBC Recent Labs    10/23/23 0354 10/24/23 0542  WBC 12.0* 13.4*  NEUTROABS 9.5* 10.2*  HGB 9.3* 9.7*  HCT 27.6* 28.5*  MCV 100.0 100.0  PLT 210 194   Basic Metabolic Panel Recent Labs    93/69/74 0234 10/23/23 0354 10/24/23 0542  NA 128* 130* 130*  K 4.6 4.0 4.4  CL 90* 92*  91*  CO2 24 27 27   GLUCOSE 73 96 87  BUN 53* 29* 30*  CREATININE 6.39* 4.68* 5.61*  CALCIUM  8.8* 8.4* 8.9  PHOS 5.5*  --   --    Liver Function Tests Recent Labs    10/23/23 0354 10/24/23 0542  AST 58* 51*  ALT 33 19  ALKPHOS 93 98  BILITOT 1.3* 1.2  PROT 6.1* 6.1*  ALBUMIN 2.6* 2.7*   No results for input(s): LIPASE, AMYLASE in the last 72 hours. High Sensitivity Troponin:   Recent Labs  Lab 10/14/23 0557 10/14/23 0805  TROPONINIHS 32* 35*    No results for input(s): TRNPT in the last 720 hours.  BNP Invalid input(s): POCBNP No results for input(s): PROBNP in the last 72 hours.  No results for input(s): BNP in the last 72 hours.  D-Dimer No results for input(s): DDIMER in the last 72 hours. Hemoglobin A1C No results for input(s): HGBA1C in the last 72 hours. Fasting Lipid Panel No results for input(s): CHOL, HDL, LDLCALC, TRIG, CHOLHDL, LDLDIRECT in the last 72 hours. Lipoprotein (a)  Date/Time Value Ref Range Status  10/15/2023 05:49 AM 226.7 (H) <75.0 nmol/L Final    Comment:    (NOTE) This test was developed and  its performance characteristics determined by [facility]. It has not been cleared or approved by the Food and Drug Administration. Note:  Values greater than or equal to 75.0 nmol/L may       indicate an independent risk factor for CHD,       but must be evaluated with caution when applied       to non-Caucasian populations due to the       influence of genetic factors on Lp(a) across       ethnicities. Performed At: [facility] [address] [place], [place] [id number] Jennette Shorter MD Ey:[id number]     Thyroid  Function Tests No results for input(s): TSH, T4TOTAL, T3FREE, THYROIDAB in the last 72 hours.  Invalid input(s): FREET3 _____________  VAS US  UPPER EXTREMITY VENOUS DUPLEX Result Date: 10/23/2023 UPPER VENOUS STUDY  Patient Name:  James Chan  Date of Exam:   10/23/2023 Medical Rec #: [id number]         Accession #:    [id number] Date of Birth: [birth year]        Patient Gender: M Patient Age:   66 years Exam Location:  [facility] Procedure:      VAS US  UPPER EXTREMITY VENOUS DUPLEX Referring Phys: CORNELIUS VAN DAM --------------------------------------------------------------------------------  Indications: Phlebitis Performing Technologist: Elmarie Lindau, RVT  Examination Guidelines: A complete evaluation includes B-mode imaging, spectral Doppler, color Doppler, and power Doppler as needed of all accessible portions of each vessel. Bilateral testing is considered an integral part of a complete examination. Limited examinations for reoccurring indications may be performed as noted.  Right Findings: +----------+------------+---------+-----------+----------+-------+ RIGHT     CompressiblePhasicitySpontaneousPropertiesSummary +----------+------------+---------+-----------+----------+-------+ Subclavian    Full                                          +----------+------------+---------+-----------+----------+-------+  Left Findings:  +----------+------------+---------+-----------+----------+-------+ LEFT      CompressiblePhasicitySpontaneousPropertiesSummary +----------+------------+---------+-----------+----------+-------+ IJV           Full                                          +----------+------------+---------+-----------+----------+-------+  Subclavian    Full                                          +----------+------------+---------+-----------+----------+-------+ Axillary      Full                                          +----------+------------+---------+-----------+----------+-------+ Brachial      Full                                          +----------+------------+---------+-----------+----------+-------+ Radial        Full                                          +----------+------------+---------+-----------+----------+-------+ Ulnar         Full                                          +----------+------------+---------+-----------+----------+-------+ Cephalic                                            absent  +----------+------------+---------+-----------+----------+-------+ Basilic       Full                                          +----------+------------+---------+-----------+----------+-------+  Summary:  Right: No evidence of thrombosis in the subclavian.  Left: No evidence of deep vein thrombosis in the upper extremity.  *See table(s) above for measurements and observations.  Diagnosing physician: Debby Robertson Electronically signed by Debby Robertson on 10/23/2023 at 9:29:15 PM.    Final    CT Angio Chest/Abd/Pel for Dissection W and/or W/WO Result Date: 10/17/2023 CLINICAL DATA:  Aortic aneurysm suspected. Status post cardiac catheterization with left groin hematoma. Drop in hemoglobin. EXAM: CT ANGIOGRAPHY CHEST, ABDOMEN AND PELVIS TECHNIQUE: Non-contrast CT of the chest was initially obtained. Multidetector CT imaging through the chest, abdomen and  pelvis was performed using the standard protocol during bolus administration of intravenous contrast. Multiplanar reconstructed images and MIPs were obtained and reviewed to evaluate the vascular anatomy. RADIATION DOSE REDUCTION: This exam was performed according to the departmental dose-optimization program which includes automated exposure control, adjustment of the mA and/or kV according to patient size and/or use of iterative reconstruction technique. CONTRAST:  OMNIPAQUE  IOHEXOL  350 MG/ML SOLN COMPARISON:  CT chest abdomen and pelvis 01/14/2020. FINDINGS: CTA CHEST FINDINGS Cardiovascular: The the heart is enlarged. Aorta is normal in size. There is no evidence for dissection. Right-sided Swan-Ganz catheter is present with distal tip in the right main pulmonary artery. There are atherosclerotic calcifications throughout aorta. Origin of the great vessels patent. There is severe focal stenosis/occlusion at the origin of the left common carotid artery. Mediastinum/Nodes: There are numerous nonenlarged mediastinal and hilar lymph nodes diffusely.  The visualized esophagus and thyroid  gland are within normal limits. Lungs/Pleura: Moderate right and small left pleural effusions are present. There is compressive atelectasis of the right lower lobe. There is minimal atelectasis in the left lower lobe. There some ground-glass opacities and airspace opacities in the posterior right upper lobe. There are few scattered ground-glass nodular densities measuring up to 6 mm throughout the left lung apex. Musculoskeletal: Generator is seen in the left lateral chest wall. No acute fractures are identified. Review of the MIP images confirms the above findings. CTA ABDOMEN AND PELVIS FINDINGS VASCULAR Aorta: There is diffuse calcified atherosclerotic disease throughout the aorta. Focal dissection flap is seen just below the level of the renal arteries. Questionable prior graft or focal dissection also seen in the infrarenal  abdominal aorta at the level of aortic aneurysm which measures 3.6 x 3.5 cm. There is no wall thickening or surrounding inflammation. Celiac: Focal severe stenosis or occlusion at the origin. Branch vessels are otherwise patent. Calcified atherosclerotic disease present. Skip focal moderate stenosis and focal dissection at the origin. Otherwise patent. Calcified atherosclerotic disease present. SMA: Patent without evidence of aneurysm, dissection, vasculitis or significant stenosis. There is mild stenosis at the origins bilaterally. Calcified atherosclerotic disease seen throughout. Renals: Both renal arteries are patent without evidence of aneurysm, dissection, vasculitis, fibromuscular dysplasia or significant stenosis. IMA: Patent. Inflow: Bilateral common iliac grafts are present which appear patent. There is severe stool focal stenosis at the origin of the right common iliac artery. Right iliac the arteries are otherwise patent. Left common iliac stent widely patent. Left-sided venous catheter is present distal tip ending in the left external iliac artery. Otherwise, common, external and internal iliac arteries are patent on the left. There is severe atherosclerotic calcification of the common carotid arteries bilaterally with areas of moderate stenosis. Veins: No obvious venous abnormality within the limitations of this arterial phase study. Review of the MIP images confirms the above findings. NON-VASCULAR Hepatobiliary: Right hepatic subcapsular hematoma identified measuring up to 12 mm. No focal liver laceration identified. Gallbladder and bile ducts are within normal limits. Pancreas: There is mild inflammatory stranding surrounding the head of the pancreas. No fluid collection or ductal dilatation. Spleen: Within normal limits for arterial phase imaging. Adrenals/Urinary Tract: Bilateral adrenal nodules appear unchanged compatible with adenomas. There is no hydronephrosis in either kidney. There is  contrast in the bilateral renal collecting systems. There is moderate left perinephric hyperdense stranding which has increased when compared to the prior examination. This appears hyperdense worrisome for hemorrhage. There is contrast throughout the bladder. The bladder is otherwise within normal limits. Stomach/Bowel: Stomach is within normal limits. Appendix appears normal. No evidence of bowel wall thickening, distention, or inflammatory changes. Lymphatic: There are numerous nonenlarged upper abdominal and retroperitoneal lymph nodes. Reproductive: Prostate gland is enlarged. Other: There is a moderate amount of hyperdense free fluid in the pelvis and a small amount of hyperdense free fluid in the bilateral pericolic gutters. Musculoskeletal: Mild compression deformity of L3 is unchanged. There is subcutaneous hyperdense fluid in the left inguinal region likely related to hemorrhage. No active bleeding identified. Review of the MIP images confirms the above findings. IMPRESSION: Nonvascular 1. Moderate left perinephric hyperdense stranding worrisome for hemorrhage. 2. Moderate amount of hyperdense free fluid in the pelvis and small amount of hyperdense free fluid in the bilateral pericolic gutters. 3. Right hepatic subcapsular hematoma. No focal liver laceration identified. 4. Left inguinal subcutaneous hematoma. No active bleeding identified. 5. Cardiomegaly. 6. Moderate right  and small left pleural effusions. 7. Ground-glass opacities and airspace opacities in the posterior right upper lobe worrisome for infection or edema. 8. Multiple ground-glass nodular densities measuring up to 6 mm in the left lung apex. Vascular 1. Focal dissection flap in the infrarenal abdominal aorta just below the level of the renal arteries. Questionable prior graft or focal dissection also seen in the infrarenal abdominal aorta at the level of aortic aneurysm which measures 3.6 x 3.5 cm. No wall thickening or surrounding  inflammation. 2. Severe focal stenosis/occlusion at the origin of the left common carotid artery. 3. Focal severe stenosis at the origin of the celiac artery. 4. 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 5. Bilateral common iliac artery grafts are patent. There is severe focal stenosis at the origin of the right common iliac artery. 6. Aortic atherosclerosis. Aortic Atherosclerosis (ICD10-I70.0). Electronically Signed   By: Greig Pique M.D.   On: 10/17/2023 23:40   CARDIAC CATHETERIZATION Result Date: 10/17/2023   Dist LM lesion is 40% stenosed.   Prox LAD to Mid LAD lesion is 90% stenosed.   Mid Cx to Dist Cx lesion is 100% stenosed.   Mid Cx lesion is 60% stenosed.   Ost RCA to Dist RCA lesion is 100% stenosed.   1st Diag lesion is 95% stenosed.   A stent was successfully placed.   A stent was successfully placed.   Post intervention, there is a 0% residual stenosis.   Post intervention, there is a 0% residual stenosis. 1.  Right IJ Swan placement and right heart catheterization demonstrating Fick cardiac output of 7.24 L/min, Fick cardiac index of 4.0 L/min/m, thermodilution cardiac output of 3.8 L/min and thermodilution cardiac index of 2.1 L/min/m with the following hemodynamics:  Right atrial pressure mean of 16 mmHg  Right ventricular pressure 67/20 with an end-diastolic pressure of 19 mmHg  Wedge pressure mean of 23 mmHg with V waves to 33 mmHg  PA pressure of 68/31 with a mean of 30 mmHg  PVR of 2.1 Woods units by Fick; 3.9 Wood units by thermal dilution  PA pulsatility index of 2.3 2.  Successful high risk, complex PCI of LAD diagonal bifurcation using orbital atherectomy of both vessels, Cutting Balloon angioplasty of both vessels, and culotte technique.  Procedure was not performed with Impella support due to inability to pass the Impella through the left common iliac and abdominal aortic aneurysm (see procedural notes). 3.  LVEDP of 21 mmHg  Recommendations: The results were reviewed with the patient's family and Dr. Court, the 14 French indwelling sheath in the left common femoral artery will be removed when the ACT is less than 160.  The patient be transferred to to heart unit for further monitoring.   DG CHEST PORT 1 VIEW Result Date: 10/17/2023 CLINICAL DATA:  [id number] Acute exacerbation of CHF (congestive heart failure) (HCC) [id number]. EXAM: PORTABLE CHEST 1 VIEW COMPARISON:  10/14/2023. FINDINGS: Low lung volume. Redemonstration of moderate diffuse pulmonary vascular congestion and bilateral small-to-moderate layering pleural effusions, right more than left. There also probable associated compressive atelectatic changes in the bilateral lungs. Overall, findings appear essentially unchanged since the prior study. No pneumothorax on either side. Stable moderately enlarged cardio-mediastinal silhouette. Stable appearance of AICD. No acute osseous abnormalities. The soft tissues are within normal limits. IMPRESSION: *Essentially stable findings compatible with congestive heart failure/pulmonary edema. Electronically Signed   By: Ree Molt M.D.   On: 10/17/2023 14:01   ECHOCARDIOGRAM COMPLETE  Result Date: 10/16/2023    ECHOCARDIOGRAM REPORT   Patient Name:   James Chan Date of Exam: 10/16/2023 Medical Rec #:  [id number]        Height:       66.0 in Accession #:    [id number]       Weight:       160.1 lb Date of Birth:  01/01/1951       BSA:          1.819 m Patient Age:    72 years         BP:           142/62 mmHg Patient Gender: M                HR:           61 bpm. Exam Location:  Inpatient Procedure: 2D Echo, Cardiac Doppler and Color Doppler (Both Spectral and Color            Flow Doppler were utilized during procedure). Indications:    Acute Ischemic heart disease  History:        Patient has prior history of Echocardiogram examinations, most                 recent 07/13/2023. CAD.  Sonographer:    Benard Stallion Referring Phys:  [phone] Grettell Ransdell END IMPRESSIONS  1. Left ventricular ejection fraction, by estimation, is <20%. The left ventricle has severely decreased function. The left ventricle demonstrates global hypokinesis. The left ventricular internal cavity size was moderately dilated. There is mild concentric left ventricular hypertrophy. Left ventricular diastolic parameters are consistent with Grade II diastolic dysfunction (pseudonormalization).  2. Right ventricular systolic function is mildly reduced. The right ventricular size is mildly enlarged. There is severely elevated pulmonary artery systolic pressure.  3. Left atrial size was severely dilated.  4. Right atrial size was severely dilated.  5. The mitral valve is abnormal. Trivial mitral valve regurgitation. No evidence of mitral stenosis.  6. Eccentric jet.  7. The aortic valve is tricuspid. There is moderate calcification of the aortic valve. Aortic valve regurgitation is trivial. Aortic valve sclerosis is present, with no evidence of aortic valve stenosis. Comparison(s): Prior images reviewed side by side. Pericardial effusion has decreased. FINDINGS  Left Ventricle: Left ventricular ejection fraction, by estimation, is <20%. The left ventricle has severely decreased function. The left ventricle demonstrates global hypokinesis. The left ventricular internal cavity size was moderately dilated. There is mild concentric left ventricular hypertrophy. Left ventricular diastolic parameters are consistent with Grade II diastolic dysfunction (pseudonormalization). Right Ventricle: The right ventricular size is mildly enlarged. No increase in right ventricular wall thickness. Right ventricular systolic function is mildly reduced. There is severely elevated pulmonary artery systolic pressure. The tricuspid regurgitant velocity is 3.85 m/s, and with an assumed right atrial pressure of 8 mmHg, the estimated right ventricular systolic pressure is 67.3 mmHg. Left Atrium: Left atrial  size was severely dilated. Right Atrium: Right atrial size was severely dilated. Pericardium: Trivial pericardial effusion is present. The pericardial effusion is anterior to the right ventricle. Mitral Valve: The mitral valve is abnormal. Trivial mitral valve regurgitation. No evidence of mitral valve stenosis. Tricuspid Valve: Eccentric jet. The tricuspid valve is normal in structure. Tricuspid valve regurgitation is mild . No evidence of tricuspid stenosis. Aortic Valve: The aortic valve is tricuspid. There is moderate calcification of the aortic valve. Aortic valve regurgitation is trivial. Aortic valve sclerosis is present, with no evidence of  aortic valve stenosis. Aortic valve mean gradient measures 3.0  mmHg. Aortic valve peak gradient measures 6.0 mmHg. Aortic valve area, by VTI measures 1.17 cm. Pulmonic Valve: The pulmonic valve was normal in structure. Pulmonic valve regurgitation is trivial. No evidence of pulmonic stenosis. Aorta: The aortic root and ascending aorta are structurally normal, with no evidence of dilitation. IAS/Shunts: The atrial septum is grossly normal.  LEFT VENTRICLE PLAX 2D LVIDd:         6.40 cm      Diastology LVIDs:         6.00 cm      LV e' medial:    2.28 cm/s LV PW:         1.10 cm      LV E/e' medial:  38.3 LV IVS:        1.10 cm      LV e' lateral:   5.11 cm/s LVOT diam:     2.20 cm      LV E/e' lateral: 17.1 LV SV:         38 LV SV Index:   21 LVOT Area:     3.80 cm  LV Volumes (MOD) LV vol d, MOD A2C: 280.0 ml LV vol d, MOD A4C: 225.0 ml LV vol s, MOD A2C: 237.0 ml LV vol s, MOD A4C: 183.0 ml LV SV MOD A2C:     43.0 ml LV SV MOD A4C:     225.0 ml LV SV MOD BP:      44.0 ml RIGHT VENTRICLE RV Basal diam:  4.20 cm RV Mid diam:    3.10 cm RV S prime:     7.51 cm/s TAPSE (M-mode): 1.5 cm LEFT ATRIUM             Index        RIGHT ATRIUM           Index LA diam:        4.40 cm 2.42 cm/m   RA Area:     26.30 cm LA Vol (A2C):   86.7 ml 47.66 ml/m  RA Volume:   92.80 ml   51.01 ml/m LA Vol (A4C):   83.3 ml 45.79 ml/m LA Biplane Vol: 94.7 ml 52.06 ml/m  AORTIC VALVE AV Area (Vmax):    1.41 cm AV Area (Vmean):   1.31 cm AV Area (VTI):     1.17 cm AV Vmax:           122.00 cm/s AV Vmean:          85.500 cm/s AV VTI:            0.321 m AV Peak Grad:      6.0 mmHg AV Mean Grad:      3.0 mmHg LVOT Vmax:         45.30 cm/s LVOT Vmean:        29.400 cm/s LVOT VTI:          0.099 m LVOT/AV VTI ratio: 0.31  AORTA Ao Root diam: 3.10 cm Ao Asc diam:  3.50 cm MITRAL VALVE               TRICUSPID VALVE MV Area (PHT): 4.15 cm    TR Peak grad:   59.3 mmHg MV Decel Time: 183 msec    TR Vmax:        385.00 cm/s MV E velocity: 87.30 cm/s MV A velocity: 56.80 cm/s  SHUNTS MV E/A ratio:  1.54  Systemic VTI:  0.10 m                            Systemic Diam: 2.20 cm Stanly Leavens MD Electronically signed by Stanly Leavens MD Signature Date/Time: 10/16/2023/11:21:59 AM    Final    CARDIAC CATHETERIZATION Result Date: 10/15/2023 Conclusions: Severe multivessel coronary artery disease, including 40% distal LMCA stenosis, 90% proximal LAD stenosis involving large D1 branch with heavy calcification, sequential 60% and 100% mid and distal LCx stenoses, and chronic total occlusion of ostial through distal RCA. Mildly-moderately elevated left heart filling pressures (LVEDP, PCWP 25 mmHg). Severely elevated right heart and pulmonary artery pressures (RA 16, PA 78/30, mean PAP 46 mmHg). Normal Fick cardiac output/index (CO 4.9 L/min, CI 2.7 L/min/m^2). Reduced thermodilution cardiac output/index (CO 3.8 L/min, CI 2.1 L/min/m^2). Calcified abdominal aorta with moderate luminal irregularities (distal aorta not well-visualized due to low cardiac output). Patent left common iliac stent with 30-40% in-stent restenosis. Recommendations: Cardiac surgery consultation given significant multivessel disease and complex proximal LAD/D1 lesion.  If the patient is not a candidate for surgery, high-risk  PCI to LAD/D1 versus palliative medical therapy will need to be considered. Resume heparin  8 hours after femoral hemostasis has been achieved. Proceed with hemodialysis today and optimize chronic HFrEF; consultation with advanced heart failure team may be helpful. Repeat echocardiogram to reassess LVEF and mitral regurgitation; prominent v-waves suggest significant mitral regurgitation. Aggressive secondary prevention of coronary artery disease. Lonni Hanson, MD [facility]  PERIPHERAL VASCULAR CATHETERIZATION Result Date: 10/15/2023 Conclusions: Severe multivessel coronary artery disease, including 40% distal LMCA stenosis, 90% proximal LAD stenosis involving large D1 branch with heavy calcification, sequential 60% and 100% mid and distal LCx stenoses, and chronic total occlusion of ostial through distal RCA. Mildly-moderately elevated left heart filling pressures (LVEDP, PCWP 25 mmHg). Severely elevated right heart and pulmonary artery pressures (RA 16, PA 78/30, mean PAP 46 mmHg). Normal Fick cardiac output/index (CO 4.9 L/min, CI 2.7 L/min/m^2). Reduced thermodilution cardiac output/index (CO 3.8 L/min, CI 2.1 L/min/m^2). Calcified abdominal aorta with moderate luminal irregularities (distal aorta not well-visualized due to low cardiac output). Patent left common iliac stent with 30-40% in-stent restenosis. Recommendations: Cardiac surgery consultation given significant multivessel disease and complex proximal LAD/D1 lesion.  If the patient is not a candidate for surgery, high-risk PCI to LAD/D1 versus palliative medical therapy will need to be considered. Resume heparin  8 hours after femoral hemostasis has been achieved. Proceed with hemodialysis today and optimize chronic HFrEF; consultation with advanced heart failure team may be helpful. Repeat echocardiogram to reassess LVEF and mitral regurgitation; prominent v-waves suggest significant mitral regurgitation. Aggressive secondary prevention of  coronary artery disease. Lonni Hanson, MD [facility]  DG Chest Port 1 View Result Date: 10/14/2023 CLINICAL DATA:  Chest pain EXAM: PORTABLE CHEST 1 VIEW COMPARISON:  02/02/2020 FINDINGS: External AICD is unchanged with tip of lead in the projection of the inferior aspect of the aortic knob. Heart size is enlarged. There is a small to moderate right pleural effusion. Pulmonary vascular congestion. Decreased aeration to the right lower lobe scratch set decreased aeration to the right lower lung may reflect a combination of atelectasis and or airspace disease. IMPRESSION: 1. Cardiomegaly and pulmonary vascular congestion. 2. Small to moderate right pleural effusion. 3. Decreased aeration to the right lower lung may reflect a combination of atelectasis and or airspace disease. Electronically Signed   By: Waddell Calk M.D.   On: 10/14/2023 06:36  CUP PACEART INCLINIC DEVICE CHECK Result Date: 10/08/2023 Subcutaneous ICD check in clinic. Patient had 6 total events - WCT and Torsades on friday 10/05/23. Each of the 6 events were appropriately treated with 1 shock each.  . Electrode impedance status okay. No programming changes. Remaining longevity to ERI 50 %.Powell Level, BSN, RN  US  Carotid Bilateral Result Date: 09/25/2023 CLINICAL DATA:  The patient complains of dizziness and blurred vision. History of hypertension, hyperlipidemia, and visual disturbance. Bilateral ICA stents. EXAM: BILATERAL CAROTID DUPLEX ULTRASOUND TECHNIQUE: Elnor scale imaging, color Doppler and duplex ultrasound were performed of bilateral carotid and vertebral arteries in the neck. COMPARISON:  None Available. FINDINGS: Criteria: Quantification of carotid stenosis is based on velocity parameters that correlate the residual internal carotid diameter with NASCET-based stenosis levels, using the diameter of the distal internal carotid lumen as the denominator for stenosis measurement. The following velocity measurements were  obtained: RIGHT ICA: 119/16 cm/sec CCA: 114/18 cm/sec SYSTOLIC ICA/CCA RATIO:  1.04 ECA: 326 cm/sec LEFT ICA: 190/50 cm/sec CCA: 78/25 cm/sec SYSTOLIC ICA/CCA RATIO:  2.43 ECA: 178 cm/sec RIGHT CAROTID ARTERY: The right common carotid artery demonstrates near circumferential mixed irregular plaque. Carotid stent is present and patent, however significant calcified plaque creates shadowing in B-mode and may underestimate calculated velocities. RIGHT VERTEBRAL ARTERY:  Antegrade flow LEFT CAROTID ARTERY: The left common carotid artery demonstrates near circumferential irregular mixed plaque. Significant mixed plaque in the carotid bulb creating shadowing in B-mode imaging and may underestimate velocities the carotid stent on the left is patent. LEFT VERTEBRAL ARTERY: IMPRESSION: Bilateral patent ICA stent. Velocities suggest less than 50% stenosis in the right ICA and 50-69% stenosis in the left ICA. ICA velocities may be underestimated secondary to significant calcified plaque burden in this region. Electronically Signed   By: Cordella Banner   On: 09/25/2023 15:15    Disposition Pt is being discharged home today in good condition.  Follow-up Plans & Appointments  Discharge Instructions     Amb Referral to Cardiac Rehabilitation   Complete by: As directed    Cardiac Rehab at [facility]   Diagnosis: Coronary Stents   After initial evaluation and assessments completed: Virtual Based Care may be provided alone or in conjunction with Phase 2 Cardiac Rehab based on patient barriers.: Yes   Intensive Cardiac Rehabilitation (ICR) [facility] location only OR Traditional Cardiac Rehabilitation (TCR) *If criteria for ICR are not met will enroll in TCR [facility] only): Yes   Diet - low sodium heart healthy   Complete by: As directed    Discharge instructions   Complete by: As directed    Groin Site Care Refer to this sheet in the next few weeks. These instructions provide you with information on caring  for yourself after your procedure. Your caregiver may also give you more specific instructions. Your treatment has been planned according to current medical practices, but problems sometimes occur. Call your caregiver if you have any problems or questions after your procedure. HOME CARE INSTRUCTIONS You may shower 24 hours after the procedure. Remove the bandage (dressing) and gently wash the site with plain soap and water. Gently pat the site dry.  Do not apply powder or lotion to the site.  Do not sit in a bathtub, swimming pool, or whirlpool for 5 to 7 days.  No bending, squatting, or lifting anything over 10 pounds (4.5 kg) as directed by your caregiver.  Inspect the site at least twice daily.  Do not drive home if you are  discharged the same day of the procedure. Have someone else drive you.  You may drive 24 hours after the procedure unless otherwise instructed by your caregiver.  What to expect: Any bruising will usually fade within 1 to 2 weeks.  Blood that collects in the tissue (hematoma) may be painful to the touch. It should usually decrease in size and tenderness within 1 to 2 weeks.  SEEK IMMEDIATE MEDICAL CARE IF: You have unusual pain at the groin site or down the affected leg.  You have redness, warmth, swelling, or pain at the groin site.  You have drainage (other than a small amount of blood on the dressing).  You have chills.  You have a fever or persistent symptoms for more than 72 hours.  You have a fever and your symptoms suddenly get worse.  Your leg becomes pale, cool, tingly, or numb.  You have heavy bleeding from the site. Hold pressure on the site. SABRA  PLEASE DO NOT MISS ANY DOSES OF YOUR BRILINTA !!!!! Also keep a log of you blood pressures and bring back to your follow up appt. Please call the office with any questions.   Patients taking blood thinners should generally stay away from medicines like ibuprofen, Advil, Motrin, naproxen, and Aleve due to risk of  stomach bleeding. You may take Tylenol  as directed or talk to your primary doctor about alternatives.   PLEASE ENSURE THAT YOU DO NOT RUN OUT OF YOUR BRILINTA . This medication is very important to remain on for at least one year. IF you have issues obtaining this medication due to cost please CALL the office 3-5 business days prior to running out in order to prevent missing doses of this medication.   Home infusion instructions   Complete by: As directed    Instructions: Flushing of vascular access device: 0.9% NaCl pre/post medication administration and prn patency; Heparin  100 u/ml, 5ml for implanted ports and Heparin  10u/ml, 5ml for all other central venous catheters.   Increase activity slowly   Complete by: As directed    No wound care   Complete by: As directed        Discharge Medications Allergies as of 10/24/2023   No Known Allergies      Medication List     TAKE these medications    allopurinol  100 MG tablet Commonly known as: ZYLOPRIM  Take 100 mg by mouth daily at 6 PM. (1900)   amiodarone  200 MG tablet Commonly known as: PACERONE  Take 1 tablet (200 mg total) by mouth daily.   ascorbic acid  500 MG tablet Commonly known as: VITAMIN C Take 500 mg by mouth daily in the afternoon.   aspirin  EC 81 MG tablet Take 81 mg by mouth 2 (two) times a week. Swallow whole.   atorvastatin  80 MG tablet Commonly known as: LIPITOR Take 1 tablet (80 mg total) by mouth daily. Start taking on: October 25, 2023 What changed:  medication strength how much to take   cetirizine 10 MG tablet Commonly known as: ZYRTEC Take 10 mg by mouth daily as needed for allergies.   latanoprost  0.005 % ophthalmic solution Commonly known as: XALATAN  Place 1 drop into both eyes at bedtime.   lidocaine -prilocaine  cream Commonly known as: EMLA  Apply 1 application topically See admin instructions. Apply small amount to access site 1 to 2 hours before dialysis then cover with saran wrap.  Three  days a week   mexiletine 250 MG capsule Commonly known as: MEXITIL  Take 1 capsule (250 mg total)  by mouth 2 (two) times daily.   midodrine  10 MG tablet Commonly known as: PROAMATINE  Take 5-10 mg by mouth See admin instructions. Take 1 tablet by mouth with hemodialysis and 1/2 tablet all other days   nitroGLYCERIN  0.4 MG SL tablet Commonly known as: NITROSTAT  Place 1 tablet (0.4 mg total) under the tongue every 5 (five) minutes as needed for chest pain.   ondansetron  4 MG tablet Commonly known as: Zofran  Take 1 tablet (4 mg total) by mouth daily as needed for nausea or vomiting.   oxyCODONE -acetaminophen  5-325 MG tablet Commonly known as: PERCOCET/ROXICET Take 1 tablet by mouth every 4 (four) hours as needed for severe pain.   ProAir  HFA 108 (90 Base) MCG/ACT inhaler Generic drug: albuterol  Inhale 2 puffs into the lungs every 6 (six) hours as needed for wheezing or shortness of breath.   pyridoxine  100 MG tablet Commonly known as: B-6 Take 100 mg by mouth daily in the afternoon.   ticagrelor  90 MG Tabs tablet Commonly known as: BRILINTA  Take 1 tablet (90 mg total) by mouth 2 (two) times daily.   torsemide  100 MG tablet Commonly known as: DEMADEX  Take 100 mg by mouth 4 (four) times a week. Take 1 tablet (100 mg) by mouth in the morning on Tuesdays, Thursdays, Saturdays & Sundays   traZODone  50 MG tablet Commonly known as: DESYREL  Take 50 mg by mouth at bedtime.   vancomycin  IVPB Inject 750 mg into the vein every Monday, Wednesday, and Friday with hemodialysis for 11 days. Indication:  MSSA/Bacillus Bacteremia First Dose: Yes Last Day of Therapy:  11/04/23 Labs - Once weekly:  CBC/D, BMP, and vancomycin  trough. Labs - Once weekly: ESR and CRP   vitamin E 200 UNIT capsule Take 200 Units by mouth daily in the afternoon.               Home Infusion Instuctions  (From admission, onward)           Start     Ordered   10/23/23 0000  Home infusion instructions        Question:  Instructions  Answer:  Flushing of vascular access device: 0.9% NaCl pre/post medication administration and prn patency; Heparin  100 u/ml, 5ml for implanted ports and Heparin  10u/ml, 5ml for all other central venous catheters.   10/23/23 1519             Outstanding Labs/Studies  FLP/LFTs in 8 weeks   Duration of Discharge Encounter: APP Time: 25 minutes   Signed, Manuelita Rummer, NP 10/24/2023, 10:05 AM  I have personally seen and examined this patient. I agree with the assessment and plan as outlined above.  73 yo male admitted with unstable angina. Cardiac cath with severe multi vessel CAD. Turned down for CABG. High risk PCI of the LAD and Diagonal with orbital atherectomy of both vessels and DES placed in both vessels. He is on ASA and Brilinta . Also with decline in LVEF down to 20% from 30%. GDMT limited due to hypotension and need for HD. He is on amio and mexilitine for VT. ICD in place. Found to have MSSA bacteremia. ID following. PLans for Vanc as outpatient on HD days for 2 weeks.  Doing well today. No complaints except for nausea.  Labs reviewed by me Tele reviewed by me: sinus My exam: NAD, RRR LUngs clear Ext: no LE edema Plan: CAD with unstable angina: No chest pain. Continue ASA and Brilinta  for at least a year. Continue statin.  Discharge home today.  Vanc at HD.   I have spent 30 minutes in discharge planning including note review, lab review, tele review, examination, plan formulation and note construction.   Lonni Cash, MD, [facility] 10/24/2023 10:48 AM

## 2023-10-25 ENCOUNTER — Other Ambulatory Visit (HOSPITAL_COMMUNITY): Payer: Self-pay

## 2023-10-25 ENCOUNTER — Telehealth (HOSPITAL_COMMUNITY): Payer: Self-pay | Admitting: Nephrology

## 2023-10-25 NOTE — Telephone Encounter (Signed)
 Transition of care contact from inpatient facility  Date of Discharge: 10/24/23 Date of Contact: 10/25/23 - attempt #1 Method of contact: Phone  Attempted to contact patient to discuss transition of care from inpatient admission. Patient did not answer the phone. Message was left on the patient's voicemail with call back number [phone].  James Boehringer, PA-C [employer] Pager [phone]

## 2023-10-27 LAB — CULTURE, BLOOD (ROUTINE X 2)
Culture: NO GROWTH
Culture: NO GROWTH
Special Requests: ADEQUATE
Special Requests: ADEQUATE

## 2023-10-29 DIAGNOSIS — R7881 Bacteremia: Secondary | ICD-10-CM

## 2023-10-29 HISTORY — DX: Bacteremia: R78.81

## 2023-11-02 NOTE — Progress Notes (Signed)
 " Cardiology Office Note:  .   Date:  11/02/2023  ID:  James Chan, DOB December 16, 1950, MRN [id number] PCP: [name], MD  [name] Cardiologist:  Redell Leiter, MD Electrophysiologist:  Will Gladis Norton, MD    History of Present Illness: James Chan is a 73 y.o. 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  10/17/2023 cardiac cath DES pLAD to M LAD % DES to first diagonal lesion 10/16/2023 echo EF < 20% 10/15/2023 cardiac cath severe multivessel CAD >> TCTS consulted advised not a surgical candidate 02/28/2023 device check normal 03/07/2022 echo EF 20-25%, grade 3 diastolic dysfunction, elevated LA pressure, severely elevated PASP, LA mildly dilated, RA moderately dilated, mild aortic regurgitation 01/15/2020 implantation of ICD 01/15/2020 left heart cath multivessel CAD as described, 50 to 60% stenosis of the proximal LAD, CTO of distal circumflex and ostial RCA 05/12/2019 left heart cath multivessel CAD, 50 to 60% distal LMCA stenosis, 30% proximal LAD and 50% ostial D1 lesion, CTO distal left circumflex and long stented segment of the proximal through distal RCA, occlusion on the left circumflex and RCA are known to be chronic 2016 left heart cath at [facility] two-vessel CAD with significant in-stent restenosis of the RCA and subtotal occlusion of the small caliber obtuse marginal, PCI to the RCA with DES x 2  He established care with Dr. Leiter in 2018 for management of his atrial fibrillation and CAD.  He had apparently previously had multiple PCI's.  In 2016 he underwent left heart cath at [facility], and that noted reports he had previously had multiple PCI's and at that time there was significant in-stent restenosis of the RCA and subtotal occlusion of the small caliber obtuse marginal with PCI to the RCA with DES x 2.  In 2021 left heart cath revealed  occlusion of the left circumflex and RCA.  Evaluated by Dr. Leiter on 01/22/2023, he was well from a cardiac perspective, continue on low-dose amiodarone , advised to follow-up in 6 months.  Evaluated by Dr. Norton in June 2025, he had recently had 6 episodes of VT/VF treated with ICD shock, mexiletine was added twice daily, 40-month follow-up was arranged.  He was admitted to [facility] on 10/14/2023 for chest pain, underwent left heart cath on 6/23 revealing multivessel CAD, TCTS was consulted however he was not felt to be a good surgical candidate so he underwent a staged intervention on 6/25 with successful high risk PCI of the LAD and diagonal bifurcation with orbital atherectomy of both vessel and Cutting Balloon angioplasty.  Hospitalization was complicated by MSSA bacteremia, managed by infectious disease, he had a decrease in his EF to less than 20% however GDMT been prohibited secondary to ESRD and hypotension.  He presents today for follow-up after most recent left heart cath as outlined above.  He is feeling relatively poorly, hard for him to decipher if he is worse than normal or not.  He has bothered by some shortness of breath.    ROS: Review of Systems  Constitutional:  Positive for malaise/fatigue and weight loss.  Neurological:  Positive for dizziness and loss of consciousness.  Endo/Heme/Allergies:  Bruises/bleeds easily.  All other systems reviewed and are negative.    Studies Reviewed: .        Cardiac Studies & Procedures   ______________________________________________________________________________________________ CARDIAC CATHETERIZATION  CARDIAC CATHETERIZATION 10/17/2023  Conclusion   Dist LM lesion is 40% stenosed.   Prox LAD to Mid LAD lesion is 90% stenosed.   Mid Cx to Dist Cx lesion is 100% stenosed.   Mid Cx lesion is 60% stenosed.   Ost RCA to Dist RCA lesion is 100% stenosed.   1st Diag lesion is 95% stenosed.   A stent was successfully placed.   A  stent was successfully placed.   Post intervention, there is a 0% residual stenosis.   Post intervention, there is a 0% residual stenosis.  1.  Right IJ Swan placement and right heart catheterization demonstrating Fick cardiac output of 7.24 L/min, Fick cardiac index of 4.0 L/min/m, thermodilution cardiac output of 3.8 L/min and thermodilution cardiac index of 2.1 L/min/m with the following hemodynamics: Right atrial pressure mean of 16 mmHg Right ventricular pressure 67/20 with an end-diastolic pressure of 19 mmHg Wedge pressure mean of 23 mmHg with V waves to 33 mmHg PA pressure of 68/31 with a mean of 30 mmHg PVR of 2.1 Woods units by Fick; 3.9 Wood units by thermal dilution PA pulsatility index of 2.3 2.  Successful high risk, complex PCI of LAD diagonal bifurcation using orbital atherectomy of both vessels, Cutting Balloon angioplasty of both vessels, and culotte technique.  Procedure was not performed with Impella support due to inability to pass the Impella through the left common iliac and abdominal aortic aneurysm (see procedural notes). 3.  LVEDP of 21 mmHg  Recommendations: The results were reviewed with the patient's family and Dr. Court, the 14 French indwelling sheath in the left common femoral artery will be removed when the ACT is less than 160.  The patient be transferred to to heart unit for further monitoring.  Findings Coronary Findings Diagnostic  Dominance: Right  Left Main Vessel is large. Dist LM lesion is 40% stenosed. The lesion is eccentric.  Left Anterior Descending Vessel is large. Prox LAD to Mid LAD lesion is 90% stenosed. The lesion is severely calcified.  First Diagonal Branch Vessel is large in size. 1st Diag lesion is 95% stenosed. The lesion is calcified.  Left Circumflex Vessel is large. Mid Cx lesion is 60% stenosed. The lesion is eccentric. The lesion is severely calcified. Mid Cx to Dist Cx lesion is 100% stenosed. The lesion is chronically  occluded. The lesion was previously treated using a stent (unknown type) at an unknown date.  First Obtuse Marginal Branch Vessel is small in size.  Second Obtuse Marginal Branch Vessel is moderate in size.  Right Coronary Artery Ost RCA to Dist RCA lesion is 100% stenosed. The lesion is chronically occluded. The lesion was previously treated using a stent (unknown type) at an unknown date.  Right Posterior Descending Artery Collaterals RPDA filled by collaterals from 2nd Sept.  Intervention  Prox LAD to Mid LAD lesion Stent A stent was successfully placed. Post-Intervention Lesion Assessment The intervention was successful. Pre-interventional TIMI flow is 3. Post-intervention TIMI flow is 3. There is a 0% residual stenosis post intervention.  1st Diag lesion Stent A stent was successfully placed. Post-Intervention Lesion Assessment The intervention was successful. Pre-interventional TIMI flow is 3. Post-intervention TIMI flow is 3. There is a 0% residual stenosis post intervention.   CARDIAC CATHETERIZATION 10/15/2023  Conclusion Conclusions: Severe multivessel coronary artery disease, including 40% distal LMCA stenosis, 90% proximal LAD stenosis involving large D1 branch with heavy calcification, sequential 60% and 100% mid and distal LCx stenoses, and chronic total occlusion of ostial through distal RCA. Mildly-moderately elevated  left heart filling pressures (LVEDP, PCWP 25 mmHg). Severely elevated right heart and pulmonary artery pressures (RA 16, PA 78/30, mean PAP 46 mmHg). Normal Fick cardiac output/index (CO 4.9 L/min, CI 2.7 L/min/m^2). Reduced thermodilution cardiac output/index (CO 3.8 L/min, CI 2.1 L/min/m^2). Calcified abdominal aorta with moderate luminal irregularities (distal aorta not well-visualized due to low cardiac output). Patent left common iliac stent with 30-40% in-stent restenosis.  Recommendations: Cardiac surgery consultation given significant  multivessel disease and complex proximal LAD/D1 lesion.  If the patient is not a candidate for surgery, high-risk PCI to LAD/D1 versus palliative medical therapy will need to be considered. Resume heparin  8 hours after femoral hemostasis has been achieved. Proceed with hemodialysis today and optimize chronic HFrEF; consultation with advanced heart failure team may be helpful. Repeat echocardiogram to reassess LVEF and mitral regurgitation; prominent v-waves suggest significant mitral regurgitation. Aggressive secondary prevention of coronary artery disease.  Lonni Hanson, MD [facility]  Findings Coronary Findings Diagnostic  Dominance: Right  Left Main Vessel is large. Dist LM lesion is 40% stenosed. The lesion is eccentric.  Left Anterior Descending Vessel is large. Prox LAD to Mid LAD lesion is 90% stenosed. The lesion is severely calcified.  First Diagonal Branch Vessel is large in size. 1st Diag lesion is 95% stenosed. The lesion is calcified.  Left Circumflex Vessel is large. Mid Cx lesion is 60% stenosed. The lesion is eccentric. The lesion is severely calcified. Mid Cx to Dist Cx lesion is 100% stenosed. The lesion is chronically occluded. The lesion was previously treated using a stent (unknown type) at an unknown date.  First Obtuse Marginal Branch Vessel is small in size.  Second Obtuse Marginal Branch Vessel is moderate in size.  Right Coronary Artery Ost RCA to Dist RCA lesion is 100% stenosed. The lesion is chronically occluded. The lesion was previously treated using a stent (unknown type) at an unknown date.  Right Posterior Descending Artery Collaterals RPDA filled by collaterals from 2nd Sept.  Intervention  No interventions have been documented.     ECHOCARDIOGRAM  ECHOCARDIOGRAM COMPLETE 10/16/2023  Narrative ECHOCARDIOGRAM REPORT    Patient Name:   James Chan Date of Exam: 10/16/2023 Medical Rec #:  [id number]        Height:        66.0 in Accession #:    [id number]       Weight:       160.1 lb Date of Birth:  Apr 19, 1951       BSA:          1.819 m Patient Age:    72 years         BP:           142/62 mmHg Patient Gender: M                HR:           61 bpm. Exam Location:  Inpatient  Procedure: 2D Echo, Cardiac Doppler and Color Doppler (Both Spectral and Color Flow Doppler were utilized during procedure).  Indications:    Acute Ischemic heart disease  History:        Patient has prior history of Echocardiogram examinations, most recent 07/13/2023. CAD.  Sonographer:    Benard Stallion Referring Phys: [phone] [facility] END  IMPRESSIONS   1. Left ventricular ejection fraction, by estimation, is <20%. The left ventricle has severely decreased function. The left ventricle demonstrates global hypokinesis. The left ventricular internal cavity size was moderately dilated. There is  mild concentric left ventricular hypertrophy. Left ventricular diastolic parameters are consistent with Grade II diastolic dysfunction (pseudonormalization). 2. Right ventricular systolic function is mildly reduced. The right ventricular size is mildly enlarged. There is severely elevated pulmonary artery systolic pressure. 3. Left atrial size was severely dilated. 4. Right atrial size was severely dilated. 5. The mitral valve is abnormal. Trivial mitral valve regurgitation. No evidence of mitral stenosis. 6. Eccentric jet. 7. The aortic valve is tricuspid. There is moderate calcification of the aortic valve. Aortic valve regurgitation is trivial. Aortic valve sclerosis is present, with no evidence of aortic valve stenosis.  Comparison(s): Prior images reviewed side by side. Pericardial effusion has decreased.  FINDINGS Left Ventricle: Left ventricular ejection fraction, by estimation, is <20%. The left ventricle has severely decreased function. The left ventricle demonstrates global hypokinesis. The left ventricular internal cavity  size was moderately dilated. There is mild concentric left ventricular hypertrophy. Left ventricular diastolic parameters are consistent with Grade II diastolic dysfunction (pseudonormalization).  Right Ventricle: The right ventricular size is mildly enlarged. No increase in right ventricular wall thickness. Right ventricular systolic function is mildly reduced. There is severely elevated pulmonary artery systolic pressure. The tricuspid regurgitant velocity is 3.85 m/s, and with an assumed right atrial pressure of 8 mmHg, the estimated right ventricular systolic pressure is 67.3 mmHg.  Left Atrium: Left atrial size was severely dilated.  Right Atrium: Right atrial size was severely dilated.  Pericardium: Trivial pericardial effusion is present. The pericardial effusion is anterior to the right ventricle.  Mitral Valve: The mitral valve is abnormal. Trivial mitral valve regurgitation. No evidence of mitral valve stenosis.  Tricuspid Valve: Eccentric jet. The tricuspid valve is normal in structure. Tricuspid valve regurgitation is mild . No evidence of tricuspid stenosis.  Aortic Valve: The aortic valve is tricuspid. There is moderate calcification of the aortic valve. Aortic valve regurgitation is trivial. Aortic valve sclerosis is present, with no evidence of aortic valve stenosis. Aortic valve mean gradient measures 3.0 mmHg. Aortic valve peak gradient measures 6.0 mmHg. Aortic valve area, by VTI measures 1.17 cm.  Pulmonic Valve: The pulmonic valve was normal in structure. Pulmonic valve regurgitation is trivial. No evidence of pulmonic stenosis.  Aorta: The aortic root and ascending aorta are structurally normal, with no evidence of dilitation.  IAS/Shunts: The atrial septum is grossly normal.   LEFT VENTRICLE PLAX 2D LVIDd:         6.40 cm      Diastology LVIDs:         6.00 cm      LV e' medial:    2.28 cm/s LV PW:         1.10 cm      LV E/e' medial:  38.3 LV IVS:        1.10  cm      LV e' lateral:   5.11 cm/s LVOT diam:     2.20 cm      LV E/e' lateral: 17.1 LV SV:         38 LV SV Index:   21 LVOT Area:     3.80 cm  LV Volumes (MOD) LV vol d, MOD A2C: 280.0 ml LV vol d, MOD A4C: 225.0 ml LV vol s, MOD A2C: 237.0 ml LV vol s, MOD A4C: 183.0 ml LV SV MOD A2C:     43.0 ml LV SV MOD A4C:     225.0 ml LV SV MOD BP:      44.0 ml  RIGHT VENTRICLE RV Basal diam:  4.20 cm RV Mid diam:    3.10 cm RV S prime:     7.51 cm/s TAPSE (M-mode): 1.5 cm  LEFT ATRIUM             Index        RIGHT ATRIUM           Index LA diam:        4.40 cm 2.42 cm/m   RA Area:     26.30 cm LA Vol (A2C):   86.7 ml 47.66 ml/m  RA Volume:   92.80 ml  51.01 ml/m LA Vol (A4C):   83.3 ml 45.79 ml/m LA Biplane Vol: 94.7 ml 52.06 ml/m AORTIC VALVE AV Area (Vmax):    1.41 cm AV Area (Vmean):   1.31 cm AV Area (VTI):     1.17 cm AV Vmax:           122.00 cm/s AV Vmean:          85.500 cm/s AV VTI:            0.321 m AV Peak Grad:      6.0 mmHg AV Mean Grad:      3.0 mmHg LVOT Vmax:         45.30 cm/s LVOT Vmean:        29.400 cm/s LVOT VTI:          0.099 m LVOT/AV VTI ratio: 0.31  AORTA Ao Root diam: 3.10 cm Ao Asc diam:  3.50 cm  MITRAL VALVE               TRICUSPID VALVE MV Area (PHT): 4.15 cm    TR Peak grad:   59.3 mmHg MV Decel Time: 183 msec    TR Vmax:        385.00 cm/s MV E velocity: 87.30 cm/s MV A velocity: 56.80 cm/s  SHUNTS MV E/A ratio:  1.54        Systemic VTI:  0.10 m Systemic Diam: 2.20 cm  Stanly Leavens MD Electronically signed by Stanly Leavens MD Signature Date/Time: 10/16/2023/11:21:59 AM    Final    MONITORS  LONG TERM MONITOR-LIVE TELEMETRY (3-14 DAYS) 05/31/2023  Narrative Patch Wear Time:  13 days and 16 hours (2025-01-14T16:03:10-498 to 2025-01-28T08:12:27-0500)  Patient had a min HR of 54 bpm, max HR of 158 bpm, and avg HR of 68 bpm. Predominant underlying rhythm was Sinus Rhythm.  There were 6 triggered and  7 diary events.  All sinus rhythm 2 associated with single PVC  No sinus pauses 3 seconds or greater and no episodes of second or third-degree AV nodal block.  There were no episodes of atrial fibrillation or flutter.  12 Ventricular Tachycardia runs occurred, the run with the fastest interval lasting 9 beats with a max rate of 158 bpm, the longest lasting 10 beats with an avg rate of 106 bpm.  28 Supraventricular Tachycardia runs occurred, the run with the fastest interval lasting 11 beats with a max rate of 152 bpm, the longest lasting 13.4 secs with an avg rate of 96 bpm.  Isolated SVEs were rare  (<1.0%), SVE Couplets were rare (<1.0%), and SVE Triplets were rare (<1.0%).  Isolated VEs were rare (<1.0%, 9561), VE Couplets were rare (<1.0%, 296), and VE Triplets were rare (<1.0%, 21). Ventricular Bigeminy and Trigeminy were present.       ______________________________________________________________________________________________      Risk Assessment/Calculations:    CHA2DS2-VASc Score = 5  This indicates a 7.2% annual risk of stroke. The patient's score is based upon: CHF History: 1 HTN History: 1 Diabetes History: 1 Stroke History: 0 Vascular Disease History: 1 Age Score: 1 Gender Score: 0         Physical Exam:   VS:  There were no vitals taken for this visit.   Wt Readings from Last 3 Encounters:  10/22/23 151 lb 10.8 oz (68.8 kg)  10/08/23 152 lb 9.6 oz (69.2 kg)  09/18/23 151 lb (68.5 kg)    GEN: Chronically ill-appearing, no acute distress NECK: No JVD; No carotid bruits CARDIAC: RRR, no murmurs, rubs, gallops RESPIRATORY:  RLL diminished, clear elsewhere ABDOMEN: Soft, non-tender, non-distended EXTREMITIES:  No edema; No deformity   ASSESSMENT AND PLAN: .   CAD-multiple interventions and know CTO of RCA and distal circumflex.  [facility] 09/2023 cardiac cath DES pLAD to M LAD &  DES to first diagonal lesion, continue Brilinta  90 mg twice daily, aspirin  81 mg  daily, Lipitor 80 mg daily, nitroglycerin  as needed.  NSVT/on amiodarone  therapy/presence of ICD-no recent shocks since he last saw Dr. Inocencio. Continue amiodarone  200 mg daily -- labs checked at HD and will be checked tomorrow. Continue Mexiletine 250 mg twice daily.   Pleural effusion - hard for him to decipher if his shortness of breath is better or not, lung sounds are diminished on RLL, will repeat CXR.    HFrEF-NYHA class II-III, volume status managed by hemodialysis.  Continue Demadex  100 mg 4 times a week, further GDMT is prohibited secondary to kidney dysfunction.  ESRD on HD-Monday Wednesday Friday, labs are monitored by his nephrologist.        Dispo: chest xray, follow up with Dr. Monetta in 6 weeks.   Signed, Delon JAYSON Hoover, NP  "

## 2023-11-05 ENCOUNTER — Telehealth (HOSPITAL_COMMUNITY): Payer: Self-pay

## 2023-11-05 NOTE — Telephone Encounter (Signed)
 Faxed outside referral for Phase II Cardiac Rehab to [place].

## 2023-11-06 ENCOUNTER — Encounter: Payer: Self-pay | Admitting: Cardiology

## 2023-11-06 ENCOUNTER — Ambulatory Visit: Attending: Cardiology | Admitting: Cardiology

## 2023-11-06 VITALS — BP 120/50 | HR 71 | Ht 66.0 in | Wt 148.0 lb

## 2023-11-06 DIAGNOSIS — I4901 Ventricular fibrillation: Secondary | ICD-10-CM | POA: Diagnosis not present

## 2023-11-06 DIAGNOSIS — I472 Ventricular tachycardia, unspecified: Secondary | ICD-10-CM

## 2023-11-06 DIAGNOSIS — Z95810 Presence of automatic (implantable) cardiac defibrillator: Secondary | ICD-10-CM

## 2023-11-06 DIAGNOSIS — Z79899 Other long term (current) drug therapy: Secondary | ICD-10-CM | POA: Diagnosis not present

## 2023-11-06 DIAGNOSIS — Z992 Dependence on renal dialysis: Secondary | ICD-10-CM

## 2023-11-06 DIAGNOSIS — I5020 Unspecified systolic (congestive) heart failure: Secondary | ICD-10-CM

## 2023-11-06 DIAGNOSIS — J90 Pleural effusion, not elsewhere classified: Secondary | ICD-10-CM

## 2023-11-06 DIAGNOSIS — I2510 Atherosclerotic heart disease of native coronary artery without angina pectoris: Secondary | ICD-10-CM

## 2023-11-06 DIAGNOSIS — N186 End stage renal disease: Secondary | ICD-10-CM

## 2023-11-06 NOTE — Patient Instructions (Signed)
 Medication Instructions:  Your physician recommends that you continue on your current medications as directed. Please refer to the Current Medication list given to you today.  *If you need a refill on your cardiac medications before your next appointment, please call your pharmacy*  Lab Work: None If you have labs (blood work) drawn today and your tests are completely normal, you will receive your results only by: MyChart Message (if you have MyChart) OR A paper copy in the mail If you have any lab test that is abnormal or we need to change your treatment, we will call you to review the results.  Testing/Procedures: A chest x-ray takes a picture of the organs and structures inside the chest, including the heart, lungs, and blood vessels. This test can show several things, including, whether the heart is enlarges; whether fluid is building up in the lungs; and whether pacemaker / defibrillator leads are still in place.   Follow-Up: At [facility], you and your health needs are our priority.  As part of our continuing mission to provide you with exceptional heart care, our providers are all part of one team.  This team includes your primary Cardiologist (physician) and Advanced Practice Providers or APPs (Physician Assistants and Nurse Practitioners) who all work together to provide you with the care you need, when you need it.  Your next appointment:   6 week(s)  Provider:   Redell Leiter, MD    We recommend signing up for the patient portal called MyChart.  Sign up information is provided on this After Visit Summary.  MyChart is used to connect with patients for Virtual Visits (Telemedicine).  Patients are able to view lab/test results, encounter notes, upcoming appointments, etc.  Non-urgent messages can be sent to your provider as well.   To learn more about what you can do with MyChart, go to [email or web address].   Other Instructions None

## 2023-11-07 ENCOUNTER — Telehealth: Payer: Self-pay | Admitting: Cardiology

## 2023-11-07 NOTE — Telephone Encounter (Signed)
 Received chest xray, shows essentially the same as when he was in the hospital. Moderate amount of fluid on the right lung and small on left.   Repeat CXR in 2 weeks for evaluation.   If SOB worsens, he will need to go to the ED, suggest [facility] or [facility] as they have interventional radiology who could remove the fluid.

## 2023-11-07 NOTE — Telephone Encounter (Signed)
 Left message for the patient to call back.

## 2023-11-12 NOTE — Telephone Encounter (Signed)
 Left voice message to return call

## 2023-11-13 ENCOUNTER — Other Ambulatory Visit: Payer: Self-pay

## 2023-11-13 DIAGNOSIS — J90 Pleural effusion, not elsewhere classified: Secondary | ICD-10-CM

## 2023-11-13 NOTE — Telephone Encounter (Signed)
 Called the patient and informed him of Delon Hoover, NP's recommendation below:  Received chest xray, shows essentially the same as when he was in the hospital. Moderate amount of fluid on the right lung and small on left.    Repeat CXR in 2 weeks for evaluation.    If SOB worsens, he will need to go to the ED, suggest [facility] or [facility] as they have interventional radiology who could remove the fluid.  Patient verbalized understanding and his future appointment with Dr. Monetta was verified with the patient. An order for the patient to have his chest xray done at [facility] was completed and left at the front desk for him to pick-up. Patient had no further questions at this time.

## 2023-11-14 ENCOUNTER — Telehealth: Payer: Self-pay

## 2023-11-14 NOTE — Telephone Encounter (Signed)
 0900 CM received message from James Chan that he needs oxygen, having trouble breathing. Informed wife that he needs to see his Primary to be evaluated for oxygen. CM also contacted Grace,NP @ [facility], and asked her to initiate this with MD .

## 2023-11-22 ENCOUNTER — Encounter: Payer: Self-pay | Admitting: Cardiology

## 2023-11-26 ENCOUNTER — Ambulatory Visit: Payer: Medicare Other

## 2023-11-26 DIAGNOSIS — I472 Ventricular tachycardia, unspecified: Secondary | ICD-10-CM | POA: Diagnosis not present

## 2023-11-26 LAB — CUP PACEART REMOTE DEVICE CHECK
Battery Remaining Percentage: 47 %
Date Time Interrogation Session: 20250804074600
HighPow Impedance: 35 Ohm
Implantable Lead Connection Status: 753985
Implantable Lead Implant Date: 20210923
Implantable Lead Location: 753862
Implantable Lead Model: 3501
Implantable Lead Serial Number: 194255
Implantable Pulse Generator Implant Date: 20210923
Pulse Gen Serial Number: 140488

## 2023-11-27 ENCOUNTER — Ambulatory Visit: Payer: Self-pay | Admitting: Cardiology

## 2023-12-03 NOTE — Progress Notes (Signed)
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 6 triggered and 7 diary events.  All sinus rhythm 2 associated with single PVC  No sinus pauses 3 seconds or greater and no episodes of second or third-degree AV nodal block.  There were no episodes of atrial fibrillation or flutter.  12 Ventricular Tachycardia runs occurred, the run with the fastest interval lasting 9 beats with a max rate of 158 bpm, the longest lasting 10 beats with an avg rate of 106 bpm.  28 Supraventricular Tachycardia runs occurred, the run with the fastest interval lasting 11 beats with a max rate of 152 bpm, the longest lasting 13.4 secs with an avg rate of 96 bpm.  Isolated SVEs were rare  (<1.0%), SVE Couplets were rare (<1.0%), and SVE Triplets were rare (<1.0%).  Isolated VEs were rare (<1.0%, 9561), VE Couplets were rare (<1.0%, 296), and VE Triplets were rare (<1.0%, 21).  Ventricular Bigeminy and Trigeminy were present.       ______________________________________________________________________________________________      EKG Interpretation Date/Time:  Tuesday December 04 2023 10:23:33 EDT Ventricular Rate:  55 PR Interval:  184 QRS Duration:  128 QT Interval:  538 QTC Calculation: 514 R Axis:   117  Text Interpretation: Sinus bradycardia Non-specific intra-ventricular conduction block Cannot rule out Inferior infarct (cited on or before 18-Oct-2023) T wave abnormality, consider lateral ischemia When compared with ECG of 18-Oct-2023 07:04, PR interval has decreased T wave inversion less evident in Lateral leads Confirmed by Monetta Rogue ([id number]) on 12/04/2023 10:34:04 AM   Recent Labs: 10/08/2023: Magnesium  2.3; TSH 1.460 10/24/2023: ALT 19; BUN 30; Creatinine, Ser 5.61; Hemoglobin 9.7; Platelets 194; Potassium 4.4; Sodium 130  Recent Lipid Panel    Component Value Date/Time   CHOL 180 01/15/2020 0406   CHOL 155 12/05/2018 1010   TRIG 175 (H) 01/15/2020 0406   HDL 28 (L) 01/15/2020 0406   HDL 40 12/05/2018 1010   CHOLHDL 6.4 01/15/2020 0406   VLDL 35 01/15/2020 0406   LDLCALC 117 (H) 01/15/2020 0406   LDLCALC 97 12/05/2018 1010    Physical Exam:    VS:  BP (!) 110/40   Pulse (!) 55   Ht 6' (1.829 m)   Wt 155 lb (70.3 kg)   SpO2 (!) 83%   BMI 21.02 kg/m     Wt Readings from Last 3 Encounters:  12/04/23 155 lb (70.3 kg)  11/06/23 148 lb (67.1 kg)  10/22/23 151 lb 10.8 oz (68.8 kg)     GEN: He looks very sick debilitated almost cachectic well nourished, well developed in no acute distress HEENT: Normal NECK: He has JVD; No carotid bruits LYMPHATICS: No lymphadenopathy CARDIAC: Soft S1 S3 is present RRR,  RESPIRATORY: Diminished breath sounds  and dullness posteriorly right greater than left ABDOMEN: Soft, non-tender, non-distended MUSCULOSKELETAL: He has quite a bit of bilateral lower extremity ankle to the knee pitting edema; No  deformity  SKIN: Warm and dry NEUROLOGIC:  Alert and oriented x 3 PSYCHIATRIC:  Normal affect    Signed, James Leiter, MD  12/04/2023 11:29 AM    [facility]

## 2023-12-04 ENCOUNTER — Ambulatory Visit: Attending: Cardiology | Admitting: Cardiology

## 2023-12-04 VITALS — BP 110/40 | HR 55 | Ht 72.0 in | Wt 155.0 lb

## 2023-12-04 DIAGNOSIS — I5022 Chronic systolic (congestive) heart failure: Secondary | ICD-10-CM | POA: Diagnosis not present

## 2023-12-04 DIAGNOSIS — I472 Ventricular tachycardia, unspecified: Secondary | ICD-10-CM

## 2023-12-04 DIAGNOSIS — Z79899 Other long term (current) drug therapy: Secondary | ICD-10-CM

## 2023-12-04 DIAGNOSIS — I2510 Atherosclerotic heart disease of native coronary artery without angina pectoris: Secondary | ICD-10-CM | POA: Diagnosis not present

## 2023-12-04 DIAGNOSIS — N186 End stage renal disease: Secondary | ICD-10-CM

## 2023-12-04 DIAGNOSIS — Z992 Dependence on renal dialysis: Secondary | ICD-10-CM

## 2023-12-04 DIAGNOSIS — J90 Pleural effusion, not elsewhere classified: Secondary | ICD-10-CM

## 2023-12-04 DIAGNOSIS — Z95810 Presence of automatic (implantable) cardiac defibrillator: Secondary | ICD-10-CM | POA: Diagnosis not present

## 2023-12-04 DIAGNOSIS — E785 Hyperlipidemia, unspecified: Secondary | ICD-10-CM | POA: Insufficient documentation

## 2023-12-04 MED ORDER — CLOPIDOGREL BISULFATE 75 MG PO TABS: ORAL_TABLET | ORAL | 3 refills | Status: DC

## 2023-12-04 NOTE — Patient Instructions (Addendum)
 2 weekMedication Instructions:  Your physician has recommended you make the following change in your medication:   STOP: Brilinta  START: Plavix  75 mg daily (Take 2 tablets = 150 mg for 2 days, then 1 tablet = 75 mg daily thereafter)  *If you need a refill on your cardiac medications before your next appointment, please call your pharmacy*  Lab Work: None If you have labs (blood work) drawn today and your tests are completely normal, you will receive your results only by: MyChart Message (if you have MyChart) OR A paper copy in the mail If you have any lab test that is abnormal or we need to change your treatment, we will call you to review the results.  Testing/Procedures: A chest x-ray takes a picture of the organs and structures inside the chest, including the heart, lungs, and blood vessels. This test can show several things, including, whether the heart is enlarges; whether fluid is building up in the lungs; and whether pacemaker / defibrillator leads are still in place.   Follow-Up: At [facility], you and your health needs are our priority.  As part of our continuing mission to provide you with exceptional heart care, our providers are all part of one team.  This team includes your primary Cardiologist (physician) and Advanced Practice Providers or APPs (Physician Assistants and Nurse Practitioners) who all work together to provide you with the care you need, when you need it.  Your next appointment:   2 week(s)  Provider:   Delon Hoover, NP  We recommend signing up for the patient portal called MyChart.  Sign up information is provided on this After Visit Summary.  MyChart is used to connect with patients for Virtual Visits (Telemedicine).  Patients are able to view lab/test results, encounter notes, upcoming appointments, etc.  Non-urgent messages can be sent to your provider as well.   To learn more about what you can do with MyChart, go to [email or web address].    Other Instructions None

## 2023-12-05 ENCOUNTER — Telehealth: Payer: Self-pay | Admitting: Cardiology

## 2023-12-05 NOTE — Telephone Encounter (Signed)
 Pt says Dialysis does not handle his oxygen anymore and would like Dr. Monetta to handle this.

## 2023-12-06 NOTE — Telephone Encounter (Signed)
 Left message for the patient to call back.

## 2023-12-07 NOTE — Telephone Encounter (Signed)
 Left message for the patient to call back.

## 2023-12-10 NOTE — Telephone Encounter (Signed)
 Left message for the patient to call back.

## 2023-12-13 NOTE — Telephone Encounter (Signed)
 Left message for the patient to call back.

## 2023-12-17 NOTE — Progress Notes (Signed)
 Cardiology Office Note:  .   Date:  12/17/2023  ID:  NISHAAN STANKE, DOB Apr 20, 1951, MRN [id number] PCP: [name], MD  [facility] Providers Cardiologist:  Redell Leiter, MD Electrophysiologist:  Will Gladis Norton, MD    History of Present Illness: James Chan is a 73 y.o. 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  10/17/2023 cardiac cath DES pLAD to M LAD % DES to first diagonal lesion 10/16/2023 echo EF < 20% 10/15/2023 cardiac cath severe multivessel CAD >> TCTS consulted advised not a surgical candidate 02/28/2023 device check normal 03/07/2022 echo EF 20-25%, grade 3 diastolic dysfunction, elevated LA pressure, severely elevated PASP, LA mildly dilated, RA moderately dilated, mild aortic regurgitation 01/15/2020 implantation of ICD 01/15/2020 left heart cath multivessel CAD as described, 50 to 60% stenosis of the proximal LAD, CTO of distal circumflex and ostial RCA 05/12/2019 left heart cath multivessel CAD, 50 to 60% distal LMCA stenosis, 30% proximal LAD and 50% ostial D1 lesion, CTO distal left circumflex and long stented segment of the proximal through distal RCA, occlusion on the left circumflex and RCA are known to be chronic 2016 left heart cath at [facility] two-vessel CAD with significant in-stent restenosis of the RCA and subtotal occlusion of the small caliber obtuse marginal, PCI to the RCA with DES x 2  He established care with Dr. Leiter in 2018 for management of his atrial fibrillation and CAD.  He had apparently previously had multiple PCI's.  In 2016 he underwent left heart cath at [facility], and that noted reports he had previously had multiple PCI's and at that time there was significant in-stent restenosis of the RCA and subtotal occlusion of the small caliber obtuse marginal with PCI to the RCA with DES x 2.  In 2021 left heart cath revealed  occlusion of the left circumflex and RCA.  Evaluated by Dr. Leiter on 01/22/2023, he was well from a cardiac perspective, continue on low-dose amiodarone , advised to follow-up in 6 months.  Evaluated by Dr. Norton in June 2025, he had recently had 6 episodes of VT/VF treated with ICD shock, mexiletine was added twice daily, 63-month follow-up was arranged.  He was admitted to [facility] on 10/14/2023 for chest pain, underwent left heart cath on 6/23 revealing multivessel CAD, TCTS was consulted however he was not felt to be a good surgical candidate so he underwent a staged intervention on 6/25 with successful high risk PCI of the LAD and diagonal bifurcation with orbital atherectomy of both vessel and Cutting Balloon angioplasty.  Hospitalization was complicated by MSSA bacteremia, managed by infectious disease, he had a decrease in his EF to less than 20% however GDMT been prohibited secondary to ESRD and hypotension.  Most recently he was evaluated by Dr. Leiter on 12/04/2023, he had not been able to tolerate Brilinta  so he was reloaded on Plavix , was overall doing poorly from a volume perspective and was needing oxygen per dialysis at home and he was advised to follow-up with his PCP for this.  He presents today for follow up for his shortness of breath. We have arranged several chest xrays at [facility], but he misunderstood and felt he needed someone to call him to tell him to come in.   He is short of breath, RLL diminished and know right pleural effusion. RLL is edematous R>L and  has never been swollen this bad before. Therefore, recommendations are to go to the ED for a chest xray and evaluation for DVT.    ROS: Review of Systems  Constitutional:  Positive for malaise/fatigue and weight loss.  Neurological:  Positive for dizziness and loss of consciousness.  Endo/Heme/Allergies:  Bruises/bleeds easily.  All other systems reviewed and are negative.    Studies Reviewed: .         Cardiac Studies & Procedures   ______________________________________________________________________________________________ CARDIAC CATHETERIZATION  CARDIAC CATHETERIZATION 10/17/2023  Conclusion   Dist LM lesion is 40% stenosed.   Prox LAD to Mid LAD lesion is 90% stenosed.   Mid Cx to Dist Cx lesion is 100% stenosed.   Mid Cx lesion is 60% stenosed.   Ost RCA to Dist RCA lesion is 100% stenosed.   1st Diag lesion is 95% stenosed.   A stent was successfully placed.   A stent was successfully placed.   Post intervention, there is a 0% residual stenosis.   Post intervention, there is a 0% residual stenosis.  1.  Right IJ Swan placement and right heart catheterization demonstrating Fick cardiac output of 7.24 L/min, Fick cardiac index of 4.0 L/min/m, thermodilution cardiac output of 3.8 L/min and thermodilution cardiac index of 2.1 L/min/m with the following hemodynamics: Right atrial pressure mean of 16 mmHg Right ventricular pressure 67/20 with an end-diastolic pressure of 19 mmHg Wedge pressure mean of 23 mmHg with V waves to 33 mmHg PA pressure of 68/31 with a mean of 30 mmHg PVR of 2.1 Woods units by Fick; 3.9 Wood units by thermal dilution PA pulsatility index of 2.3 2.  Successful high risk, complex PCI of LAD diagonal bifurcation using orbital atherectomy of both vessels, Cutting Balloon angioplasty of both vessels, and culotte technique.  Procedure was not performed with Impella support due to inability to pass the Impella through the left common iliac and abdominal aortic aneurysm (see procedural notes). 3.  LVEDP of 21 mmHg  Recommendations: The results were reviewed with the patient's family and Dr. Court, the 14 French indwelling sheath in the left common femoral artery will be removed when the ACT is less than 160.  The patient be transferred to to heart unit for further monitoring.  Findings Coronary Findings Diagnostic  Dominance: Right  Left Main Vessel is  large. Dist LM lesion is 40% stenosed. The lesion is eccentric.  Left Anterior Descending Vessel is large. Prox LAD to Mid LAD lesion is 90% stenosed. The lesion is severely calcified.  First Diagonal Branch Vessel is large in size. 1st Diag lesion is 95% stenosed. The lesion is calcified.  Left Circumflex Vessel is large. Mid Cx lesion is 60% stenosed. The lesion is eccentric. The lesion is severely calcified. Mid Cx to Dist Cx lesion is 100% stenosed. The lesion is chronically occluded. The lesion was previously treated using a stent (unknown type) at an unknown date.  First Obtuse Marginal Branch Vessel is small in size.  Second Obtuse Marginal Branch Vessel is moderate in size.  Right Coronary Artery Ost RCA to Dist RCA lesion is 100% stenosed. The lesion is chronically occluded. The lesion was previously treated using a stent (unknown type) at an unknown date.  Right Posterior Descending Artery Collaterals RPDA filled by collaterals from 2nd Sept.  Intervention  Prox LAD to Mid LAD lesion Stent A stent was successfully placed. Post-Intervention Lesion Assessment The intervention was successful. Pre-interventional TIMI flow is 3. Post-intervention TIMI flow is 3. There is a  0% residual stenosis post intervention.  1st Diag lesion Stent A stent was successfully placed. Post-Intervention Lesion Assessment The intervention was successful. Pre-interventional TIMI flow is 3. Post-intervention TIMI flow is 3. There is a 0% residual stenosis post intervention.   CARDIAC CATHETERIZATION 10/15/2023  Conclusion Conclusions: Severe multivessel coronary artery disease, including 40% distal LMCA stenosis, 90% proximal LAD stenosis involving large D1 branch with heavy calcification, sequential 60% and 100% mid and distal LCx stenoses, and chronic total occlusion of ostial through distal RCA. Mildly-moderately elevated left heart filling pressures (LVEDP, PCWP 25  mmHg). Severely elevated right heart and pulmonary artery pressures (RA 16, PA 78/30, mean PAP 46 mmHg). Normal Fick cardiac output/index (CO 4.9 L/min, CI 2.7 L/min/m^2). Reduced thermodilution cardiac output/index (CO 3.8 L/min, CI 2.1 L/min/m^2). Calcified abdominal aorta with moderate luminal irregularities (distal aorta not well-visualized due to low cardiac output). Patent left common iliac stent with 30-40% in-stent restenosis.  Recommendations: Cardiac surgery consultation given significant multivessel disease and complex proximal LAD/D1 lesion.  If the patient is not a candidate for surgery, high-risk PCI to LAD/D1 versus palliative medical therapy will need to be considered. Resume heparin  8 hours after femoral hemostasis has been achieved. Proceed with hemodialysis today and optimize chronic HFrEF; consultation with advanced heart failure team may be helpful. Repeat echocardiogram to reassess LVEF and mitral regurgitation; prominent v-waves suggest significant mitral regurgitation. Aggressive secondary prevention of coronary artery disease.  Lonni Hanson, MD [facility]  Findings Coronary Findings Diagnostic  Dominance: Right  Left Main Vessel is large. Dist LM lesion is 40% stenosed. The lesion is eccentric.  Left Anterior Descending Vessel is large. Prox LAD to Mid LAD lesion is 90% stenosed. The lesion is severely calcified.  First Diagonal Branch Vessel is large in size. 1st Diag lesion is 95% stenosed. The lesion is calcified.  Left Circumflex Vessel is large. Mid Cx lesion is 60% stenosed. The lesion is eccentric. The lesion is severely calcified. Mid Cx to Dist Cx lesion is 100% stenosed. The lesion is chronically occluded. The lesion was previously treated using a stent (unknown type) at an unknown date.  First Obtuse Marginal Branch Vessel is small in size.  Second Obtuse Marginal Branch Vessel is moderate in size.  Right Coronary Artery Ost RCA  to Dist RCA lesion is 100% stenosed. The lesion is chronically occluded. The lesion was previously treated using a stent (unknown type) at an unknown date.  Right Posterior Descending Artery Collaterals RPDA filled by collaterals from 2nd Sept.  Intervention  No interventions have been documented.     ECHOCARDIOGRAM  ECHOCARDIOGRAM COMPLETE 10/16/2023  Narrative ECHOCARDIOGRAM REPORT    Patient Name:   James Chan Date of Exam: 10/16/2023 Medical Rec #:  [id number]        Height:       66.0 in Accession #:    [id number]       Weight:       160.1 lb Date of Birth:  08/30/1950       BSA:          1.819 m Patient Age:    72 years         BP:           142/62 mmHg Patient Gender: M                HR:           61 bpm. Exam Location:  Inpatient  Procedure: 2D Echo, Cardiac Doppler and Color  Doppler (Both Spectral and Color Flow Doppler were utilized during procedure).  Indications:    Acute Ischemic heart disease  History:        Patient has prior history of Echocardiogram examinations, most recent 07/13/2023. CAD.  Sonographer:    Benard Stallion Referring Phys: [phone] CHRISTOPHER END  IMPRESSIONS   1. Left ventricular ejection fraction, by estimation, is <20%. The left ventricle has severely decreased function. The left ventricle demonstrates global hypokinesis. The left ventricular internal cavity size was moderately dilated. There is mild concentric left ventricular hypertrophy. Left ventricular diastolic parameters are consistent with Grade II diastolic dysfunction (pseudonormalization). 2. Right ventricular systolic function is mildly reduced. The right ventricular size is mildly enlarged. There is severely elevated pulmonary artery systolic pressure. 3. Left atrial size was severely dilated. 4. Right atrial size was severely dilated. 5. The mitral valve is abnormal. Trivial mitral valve regurgitation. No evidence of mitral stenosis. 6. Eccentric jet. 7. The  aortic valve is tricuspid. There is moderate calcification of the aortic valve. Aortic valve regurgitation is trivial. Aortic valve sclerosis is present, with no evidence of aortic valve stenosis.  Comparison(s): Prior images reviewed side by side. Pericardial effusion has decreased.  FINDINGS Left Ventricle: Left ventricular ejection fraction, by estimation, is <20%. The left ventricle has severely decreased function. The left ventricle demonstrates global hypokinesis. The left ventricular internal cavity size was moderately dilated. There is mild concentric left ventricular hypertrophy. Left ventricular diastolic parameters are consistent with Grade II diastolic dysfunction (pseudonormalization).  Right Ventricle: The right ventricular size is mildly enlarged. No increase in right ventricular wall thickness. Right ventricular systolic function is mildly reduced. There is severely elevated pulmonary artery systolic pressure. The tricuspid regurgitant velocity is 3.85 m/s, and with an assumed right atrial pressure of 8 mmHg, the estimated right ventricular systolic pressure is 67.3 mmHg.  Left Atrium: Left atrial size was severely dilated.  Right Atrium: Right atrial size was severely dilated.  Pericardium: Trivial pericardial effusion is present. The pericardial effusion is anterior to the right ventricle.  Mitral Valve: The mitral valve is abnormal. Trivial mitral valve regurgitation. No evidence of mitral valve stenosis.  Tricuspid Valve: Eccentric jet. The tricuspid valve is normal in structure. Tricuspid valve regurgitation is mild . No evidence of tricuspid stenosis.  Aortic Valve: The aortic valve is tricuspid. There is moderate calcification of the aortic valve. Aortic valve regurgitation is trivial. Aortic valve sclerosis is present, with no evidence of aortic valve stenosis. Aortic valve mean gradient measures 3.0 mmHg. Aortic valve peak gradient measures 6.0 mmHg. Aortic valve area,  by VTI measures 1.17 cm.  Pulmonic Valve: The pulmonic valve was normal in structure. Pulmonic valve regurgitation is trivial. No evidence of pulmonic stenosis.  Aorta: The aortic root and ascending aorta are structurally normal, with no evidence of dilitation.  IAS/Shunts: The atrial septum is grossly normal.   LEFT VENTRICLE PLAX 2D LVIDd:         6.40 cm      Diastology LVIDs:         6.00 cm      LV e' medial:    2.28 cm/s LV PW:         1.10 cm      LV E/e' medial:  38.3 LV IVS:        1.10 cm      LV e' lateral:   5.11 cm/s LVOT diam:     2.20 cm      LV E/e' lateral: 17.1  LV SV:         38 LV SV Index:   21 LVOT Area:     3.80 cm  LV Volumes (MOD) LV vol d, MOD A2C: 280.0 ml LV vol d, MOD A4C: 225.0 ml LV vol s, MOD A2C: 237.0 ml LV vol s, MOD A4C: 183.0 ml LV SV MOD A2C:     43.0 ml LV SV MOD A4C:     225.0 ml LV SV MOD BP:      44.0 ml  RIGHT VENTRICLE RV Basal diam:  4.20 cm RV Mid diam:    3.10 cm RV S prime:     7.51 cm/s TAPSE (M-mode): 1.5 cm  LEFT ATRIUM             Index        RIGHT ATRIUM           Index LA diam:        4.40 cm 2.42 cm/m   RA Area:     26.30 cm LA Vol (A2C):   86.7 ml 47.66 ml/m  RA Volume:   92.80 ml  51.01 ml/m LA Vol (A4C):   83.3 ml 45.79 ml/m LA Biplane Vol: 94.7 ml 52.06 ml/m AORTIC VALVE AV Area (Vmax):    1.41 cm AV Area (Vmean):   1.31 cm AV Area (VTI):     1.17 cm AV Vmax:           122.00 cm/s AV Vmean:          85.500 cm/s AV VTI:            0.321 m AV Peak Grad:      6.0 mmHg AV Mean Grad:      3.0 mmHg LVOT Vmax:         45.30 cm/s LVOT Vmean:        29.400 cm/s LVOT VTI:          0.099 m LVOT/AV VTI ratio: 0.31  AORTA Ao Root diam: 3.10 cm Ao Asc diam:  3.50 cm  MITRAL VALVE               TRICUSPID VALVE MV Area (PHT): 4.15 cm    TR Peak grad:   59.3 mmHg MV Decel Time: 183 msec    TR Vmax:        385.00 cm/s MV E velocity: 87.30 cm/s MV A velocity: 56.80 cm/s  SHUNTS MV E/A ratio:  1.54         Systemic VTI:  0.10 m Systemic Diam: 2.20 cm  Stanly Leavens MD Electronically signed by Stanly Leavens MD Signature Date/Time: 10/16/2023/11:21:59 AM    Final    MONITORS  LONG TERM MONITOR-LIVE TELEMETRY (3-14 DAYS) 05/31/2023  Narrative Patch Wear Time:  13 days and 16 hours (2025-01-14T16:03:10-498 to 2025-01-28T08:12:27-0500)  Patient had a min HR of 54 bpm, max HR of 158 bpm, and avg HR of 68 bpm. Predominant underlying rhythm was Sinus Rhythm.  There were 6 triggered and 7 diary events.  All sinus rhythm 2 associated with single PVC  No sinus pauses 3 seconds or greater and no episodes of second or third-degree AV nodal block.  There were no episodes of atrial fibrillation or flutter.  12 Ventricular Tachycardia runs occurred, the run with the fastest interval lasting 9 beats with a max rate of 158 bpm, the longest lasting 10 beats with an avg rate of 106 bpm.  28 Supraventricular Tachycardia runs occurred, the run with the  fastest interval lasting 11 beats with a max rate of 152 bpm, the longest lasting 13.4 secs with an avg rate of 96 bpm.  Isolated SVEs were rare  (<1.0%), SVE Couplets were rare (<1.0%), and SVE Triplets were rare (<1.0%).  Isolated VEs were rare (<1.0%, 9561), VE Couplets were rare (<1.0%, 296), and VE Triplets were rare (<1.0%, 21). Ventricular Bigeminy and Trigeminy were present.       ______________________________________________________________________________________________      Risk Assessment/Calculations:    CHA2DS2-VASc Score = 5   This indicates a 7.2% annual risk of stroke. The patient's score is based upon: CHF History: 1 HTN History: 1 Diabetes History: 1 Stroke History: 0 Vascular Disease History: 1 Age Score: 1 Gender Score: 0         Physical Exam:   VS:  There were no vitals taken for this visit.   Wt Readings from Last 3 Encounters:  12/04/23 155 lb (70.3 kg)  11/06/23 148 lb (67.1 kg)  10/22/23  151 lb 10.8 oz (68.8 kg)    GEN: Chronically ill-appearing, no acute distress NECK: No JVD; No carotid bruits CARDIAC: RRR, no murmurs, rubs, gallops RESPIRATORY:  RLL diminished, clear elsewhere ABDOMEN: Soft, non-tender, non-distended EXTREMITIES:  RLE > LLE  ASSESSMENT AND PLAN: .   CAD-multiple interventions and know CTO of RCA and distal circumflex.  [facility] 09/2023 cardiac cath DES pLAD to M LAD &  DES to first diagonal lesion, continue Brilinta  90 mg twice daily, aspirin  81 mg daily, Lipitor 80 mg daily, nitroglycerin  as needed.  NSVT/on amiodarone  therapy/presence of ICD-no recent shocks since he last saw Dr. Inocencio. Continue amiodarone  200 mg daily -- labs checked at HD and will be checked tomorrow. Continue Mexiletine 250 mg twice daily.   Pleural effusion - previously arranged for cxray x 2, he did not get completed as he thought he was waiting for someone to schedule.   HFrEF-NYHA class II-III, volume status managed by hemodialysis.  Continue Demadex  100 mg 4 times a week, further GDMT is prohibited secondary to kidney dysfunction.  ESRD on HD-Monday Wednesday Friday, labs are monitored by his nephrologist.        Dispo: Proceed to the emergency department, for evaluation of SOB with known pleural effusion and new onset swelling RLE.   Signed, Delon JAYSON Hoover, NP

## 2023-12-18 ENCOUNTER — Encounter: Payer: Self-pay | Admitting: Cardiology

## 2023-12-18 ENCOUNTER — Ambulatory Visit: Attending: Cardiology | Admitting: Cardiology

## 2023-12-18 VITALS — BP 140/50 | HR 54 | Ht 72.0 in | Wt 153.0 lb

## 2023-12-18 DIAGNOSIS — N186 End stage renal disease: Secondary | ICD-10-CM

## 2023-12-18 DIAGNOSIS — Z79899 Other long term (current) drug therapy: Secondary | ICD-10-CM

## 2023-12-18 DIAGNOSIS — J90 Pleural effusion, not elsewhere classified: Secondary | ICD-10-CM

## 2023-12-18 DIAGNOSIS — I472 Ventricular tachycardia, unspecified: Secondary | ICD-10-CM

## 2023-12-18 DIAGNOSIS — Z95810 Presence of automatic (implantable) cardiac defibrillator: Secondary | ICD-10-CM

## 2023-12-18 DIAGNOSIS — I2510 Atherosclerotic heart disease of native coronary artery without angina pectoris: Secondary | ICD-10-CM

## 2023-12-18 DIAGNOSIS — Z992 Dependence on renal dialysis: Secondary | ICD-10-CM

## 2023-12-18 DIAGNOSIS — I5020 Unspecified systolic (congestive) heart failure: Secondary | ICD-10-CM

## 2023-12-29 NOTE — Progress Notes (Deleted)
  Electrophysiology Office Note:   Date:  12/29/2023  ID:  ARIZONA NORDQUIST, DOB 1950-10-21, MRN [id number]  Primary Cardiologist: Redell Leiter, MD Primary Heart Failure: None Electrophysiologist: Panagiota Perfetti Gladis Norton, MD  {Click to update primary MD,subspecialty MD or APP then REFRESH:1}    History of Present Illness:   James Chan is a 73 y.o. male with h/o coronary artery disease, anemia, end-stage renal disease, hyponatremia, VT/VF seen today for post hospital follow up.    Admitted July 2025 with unstable angina.  On 10/05/2023, he received ICD shocks and was started on mexiletine in addition to amiodarone .  During his hospitalization, he was thought to have multivessel coronary artery disease but was turndown for CABG due to multiple comorbidities.  He had high risk intervention.  Since discharge from hospital the patient reports doing ***.  he denies chest pain, palpitations, dyspnea, PND, orthopnea, nausea, vomiting, dizziness, syncope, edema, weight gain, or early satiety.   Review of systems complete and found to be negative unless listed in HPI.      EP Information / Studies Reviewed:    {EKGtoday:28818}      ICD Interrogation-  reviewed in detail today,  See PACEART report.  Device History: {INDUSTRY:28136} {Blank single:19197::Dual Chamber,Single Chamber,BiV,S-ICD} ICD implanted *** for chronic systolic heart failure History of appropriate therapy: Yes History of AAD therapy: Yes; currently on amiodarone , mexiletine   Risk Assessment/Calculations:           Physical Exam:   VS:  There were no vitals taken for this visit.   Wt Readings from Last 3 Encounters:  12/18/23 153 lb (69.4 kg)  12/04/23 155 lb (70.3 kg)  11/06/23 148 lb (67.1 kg)     GEN: Well nourished, well developed in no acute distress NECK: No JVD; No carotid bruits CARDIAC: {EPRHYTHM:28826}, no murmurs, rubs, gallops RESPIRATORY:  Clear to auscultation without rales, wheezing or  rhonchi  ABDOMEN: Soft, non-tender, non-distended EXTREMITIES:  No edema; No deformity   ASSESSMENT AND PLAN:    Chronic systolic dysfunction s/p {INDUSTRY:28136} {Blank single:19197::***,single chamber ICD,dual chamber ICD,CRT-D,S-ICD}  euvolemic today Stable on an appropriate medical regimen Normal ICD function See Pace Art report No changes today  2.  VT: No recent ICD shocks.  Continue amiodarone  and mexiletine.  3.  Coronary artery disease: Post intervention to the circumflex and diagonal.  CTO of the RCA.  Continue management per primary cardiology.  4.  End-stage renal disease: Dialysis Monday Wednesday Friday.  Disposition:   Follow up with {EPPROVIDERS:28135::EP Team} {EPFOLLOW LE:71826}   Signed, Elaisha Zahniser Gladis Norton, MD

## 2023-12-31 ENCOUNTER — Ambulatory Visit: Attending: Cardiology | Admitting: Cardiology

## 2024-01-01 ENCOUNTER — Encounter: Payer: Self-pay | Admitting: Cardiology

## 2024-01-17 ENCOUNTER — Ambulatory Visit: Admitting: Cardiology

## 2024-01-21 NOTE — Progress Notes (Signed)
 Remote ICD Transmission

## 2024-02-04 NOTE — Progress Notes (Signed)
 Patient scheduled in Glaucoma clinic in error - was supposed to return for neuro-ophthalmology clinic. Patient presented with the option to wait and see Dr. Rema this morning, however prefers to reschedule and return at a later date.  Plan to return 11/28 for neuro-ophthalmology CAP.   I saw and evaluated the patient and discussed the assessment and plan with the resident. I participated in or provided the required supervision for the key and critical portions of the service. I reviewed the resident's note. I independently reviewed the image(s) and agree with the interpretation and I agree with the resident's findings and plan as documented in the resident's note. Geofm Abts, MD

## 2024-02-25 ENCOUNTER — Ambulatory Visit: Payer: Medicare Other

## 2024-02-25 DIAGNOSIS — I2510 Atherosclerotic heart disease of native coronary artery without angina pectoris: Secondary | ICD-10-CM | POA: Diagnosis not present

## 2024-02-25 LAB — CUP PACEART REMOTE DEVICE CHECK
Battery Remaining Percentage: 44 %
Date Time Interrogation Session: 20251103070200
HighPow Impedance: 45 Ohm
Implantable Lead Connection Status: 753985
Implantable Lead Implant Date: 20210923
Implantable Lead Location: 753862
Implantable Lead Model: 3501
Implantable Lead Serial Number: 194255
Implantable Pulse Generator Implant Date: 20210923
Pulse Gen Serial Number: 140488

## 2024-02-26 ENCOUNTER — Ambulatory Visit: Payer: Self-pay | Admitting: Cardiology

## 2024-02-29 NOTE — Progress Notes (Signed)
 Remote ICD Transmission

## 2024-03-05 DIAGNOSIS — K4090 Unilateral inguinal hernia, without obstruction or gangrene, not specified as recurrent: Secondary | ICD-10-CM | POA: Insufficient documentation

## 2024-03-05 DIAGNOSIS — R918 Other nonspecific abnormal finding of lung field: Secondary | ICD-10-CM | POA: Insufficient documentation

## 2024-03-05 DIAGNOSIS — M7989 Other specified soft tissue disorders: Secondary | ICD-10-CM | POA: Insufficient documentation

## 2024-03-05 DIAGNOSIS — R2231 Localized swelling, mass and lump, right upper limb: Secondary | ICD-10-CM | POA: Insufficient documentation

## 2024-03-05 DIAGNOSIS — I471 Supraventricular tachycardia, unspecified: Secondary | ICD-10-CM | POA: Insufficient documentation

## 2024-03-05 DIAGNOSIS — R079 Chest pain, unspecified: Secondary | ICD-10-CM | POA: Insufficient documentation

## 2024-03-05 DIAGNOSIS — J90 Pleural effusion, not elsewhere classified: Secondary | ICD-10-CM | POA: Insufficient documentation

## 2024-03-05 DIAGNOSIS — I509 Heart failure, unspecified: Secondary | ICD-10-CM | POA: Insufficient documentation

## 2024-03-05 DIAGNOSIS — E8771 Transfusion associated circulatory overload: Secondary | ICD-10-CM | POA: Insufficient documentation

## 2024-03-05 NOTE — Progress Notes (Signed)
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 06 2024 08:21:59 EST Ventricular Rate:  63 PR Interval:  304 QRS Duration:  142 QT Interval:  508 QTC Calculation: 519 R Axis:   108  Text Interpretation: Sinus rhythm with 1st degree A-V block Baseline wander Rightward axis Non-specific intra-ventricular conduction block atypical LBBB Inferior infarct (cited on or before 18-Oct-2023) When compared with ECG of 04-Dec-2023 10:23, No significant change since last tracing PR interval has increased Confirmed by Monetta Rogue ([id number]) on 03/06/2024 8:24:34 AM   Recent Labs: 10/08/2023: Magnesium  2.3; TSH 1.460 10/24/2023: ALT 19; BUN 30; Creatinine, Ser 5.61; Hemoglobin 9.7; Platelets 194; Potassium 4.4; Sodium 130  Recent Lipid Panel    Component Value Date/Time   CHOL 180 01/15/2020 0406   CHOL 155 12/05/2018 1010   TRIG 175 (H) 01/15/2020 0406   HDL 28 (L) 01/15/2020  0406   HDL 40 12/05/2018 1010   CHOLHDL 6.4 01/15/2020 0406   VLDL 35 01/15/2020 0406   LDLCALC 117 (H) 01/15/2020 0406   LDLCALC 97 12/05/2018 1010    Physical Exam:    VS:  BP (!) 140/58   Pulse 63   Ht 6' (1.829 m)   Wt 149 lb 3.2 oz (67.7 kg)   SpO2 97%   BMI 20.24 kg/m     Wt Readings from Last 3 Encounters:  03/06/24 149 lb 3.2 oz (67.7 kg)  12/18/23 153 lb (69.4 kg)  12/04/23 155 lb (70.3 kg)     GEN: He looks cachectic chronically ill weak well nourished, well developed in no acute distress HEENT: Normal NECK: He has mild JVD; No carotid bruits LYMPHATICS: No lymphadenopathy CARDIAC: Soft S1 no S3 RRR,  RESPIRATORY:  Clear to auscultation without rales, wheezing or rhonchi  ABDOMEN: Soft, non-tender, non-distended MUSCULOSKELETAL:  No edema; No deformity  SKIN: Warm and dry NEUROLOGIC:  Alert and oriented x 3 PSYCHIATRIC:  Normal affect    Signed, Rogue Monetta, MD  03/06/2024 8:42 AM    [facility]

## 2024-03-06 ENCOUNTER — Ambulatory Visit: Attending: Cardiology | Admitting: Cardiology

## 2024-03-06 ENCOUNTER — Encounter: Payer: Self-pay | Admitting: Cardiology

## 2024-03-06 VITALS — BP 128/62 | HR 63 | Ht 72.0 in | Wt 149.2 lb

## 2024-03-06 DIAGNOSIS — I2510 Atherosclerotic heart disease of native coronary artery without angina pectoris: Secondary | ICD-10-CM | POA: Diagnosis not present

## 2024-03-06 DIAGNOSIS — Z95810 Presence of automatic (implantable) cardiac defibrillator: Secondary | ICD-10-CM

## 2024-03-06 DIAGNOSIS — I472 Ventricular tachycardia, unspecified: Secondary | ICD-10-CM | POA: Diagnosis not present

## 2024-03-06 DIAGNOSIS — I5020 Unspecified systolic (congestive) heart failure: Secondary | ICD-10-CM | POA: Diagnosis not present

## 2024-03-06 MED ORDER — ALBUTEROL SULFATE HFA 108 (90 BASE) MCG/ACT IN AERS: 2.0000 | INHALATION_SPRAY | Freq: Four times a day (QID) | RESPIRATORY_TRACT | 0 refills | Status: DC | PRN

## 2024-03-06 NOTE — Patient Instructions (Signed)
Medication Instructions:  Your physician recommends that you continue on your current medications as directed. Please refer to the Current Medication list given to you today.  *If you need a refill on your cardiac medications before your next appointment, please call your pharmacy*   Lab Work: None ordered If you have labs (blood work) drawn today and your tests are completely normal, you will receive your results only by: MyChart Message (if you have MyChart) OR A paper copy in the mail If you have any lab test that is abnormal or we need to change your treatment, we will call you to review the results.   Testing/Procedures: None ordered   Follow-Up: At [facility], you and your health needs are our priority.  As part of our continuing mission to provide you with exceptional heart care, we have created designated Provider Care Teams.  These Care Teams include your primary Cardiologist (physician) and Advanced Practice Providers (APPs -  Physician Assistants and Nurse Practitioners) who all work together to provide you with the care you need, when you need it.  We recommend signing up for the patient portal called "MyChart".  Sign up information is provided on this After Visit Summary.  MyChart is used to connect with patients for Virtual Visits (Telemedicine).  Patients are able to view lab/test results, encounter notes, upcoming appointments, etc.  Non-urgent messages can be sent to your provider as well.   To learn more about what you can do with MyChart, go to [email or web address].com.    Your next appointment:   6 month(s)  The format for your next appointment:   In Person  Provider:   Brian Munley, MD    Other Instructions none  Important Information About Sugar       

## 2024-05-02 DIAGNOSIS — I454 Nonspecific intraventricular block: Secondary | ICD-10-CM | POA: Diagnosis not present

## 2024-05-02 DIAGNOSIS — R9431 Abnormal electrocardiogram [ECG] [EKG]: Secondary | ICD-10-CM | POA: Diagnosis not present

## 2024-05-07 DIAGNOSIS — I371 Nonrheumatic pulmonary valve insufficiency: Secondary | ICD-10-CM | POA: Diagnosis not present

## 2024-05-07 DIAGNOSIS — I361 Nonrheumatic tricuspid (valve) insufficiency: Secondary | ICD-10-CM | POA: Diagnosis not present

## 2024-05-07 DIAGNOSIS — I350 Nonrheumatic aortic (valve) stenosis: Secondary | ICD-10-CM | POA: Diagnosis not present

## 2024-05-07 DIAGNOSIS — I340 Nonrheumatic mitral (valve) insufficiency: Secondary | ICD-10-CM | POA: Diagnosis not present

## 2024-05-07 DIAGNOSIS — J90 Pleural effusion, not elsewhere classified: Secondary | ICD-10-CM | POA: Diagnosis not present

## 2024-05-10 ENCOUNTER — Emergency Department (HOSPITAL_COMMUNITY)
Admission: EM | Admit: 2024-05-10 | Discharge: 2024-05-25 | Disposition: E | Attending: Emergency Medicine | Admitting: Emergency Medicine

## 2024-05-10 DIAGNOSIS — I4901 Ventricular fibrillation: Secondary | ICD-10-CM | POA: Diagnosis not present

## 2024-05-10 DIAGNOSIS — I2510 Atherosclerotic heart disease of native coronary artery without angina pectoris: Secondary | ICD-10-CM | POA: Insufficient documentation

## 2024-05-10 DIAGNOSIS — Z992 Dependence on renal dialysis: Secondary | ICD-10-CM | POA: Diagnosis not present

## 2024-05-10 DIAGNOSIS — I469 Cardiac arrest, cause unspecified: Secondary | ICD-10-CM | POA: Diagnosis present

## 2024-05-10 DIAGNOSIS — I509 Heart failure, unspecified: Secondary | ICD-10-CM | POA: Diagnosis not present

## 2024-05-10 DIAGNOSIS — Z7902 Long term (current) use of antithrombotics/antiplatelets: Secondary | ICD-10-CM | POA: Insufficient documentation

## 2024-05-10 DIAGNOSIS — N186 End stage renal disease: Secondary | ICD-10-CM | POA: Diagnosis not present

## 2024-05-10 DIAGNOSIS — Z7982 Long term (current) use of aspirin: Secondary | ICD-10-CM | POA: Insufficient documentation

## 2024-05-10 DIAGNOSIS — Z79899 Other long term (current) drug therapy: Secondary | ICD-10-CM | POA: Insufficient documentation

## 2024-05-10 LAB — CBG MONITORING, ED: Glucose-Capillary: 183 mg/dL — ABNORMAL HIGH (ref 70–99)

## 2024-05-10 SURGERY — CORONARY/GRAFT ACUTE MI REVASCULARIZATION
Anesthesia: LOCAL

## 2024-05-10 MED ORDER — EPINEPHRINE 1 MG/10ML IV SOSY
PREFILLED_SYRINGE | INTRAVENOUS | Status: AC | PRN
  Administered 2024-05-10 (×2): 1 mg via INTRAVENOUS

## 2024-05-10 MED ORDER — ATROPINE SULFATE 1 MG/10ML IJ SOSY
PREFILLED_SYRINGE | INTRAMUSCULAR | Status: AC | PRN
  Administered 2024-05-10: 1 mg via INTRAVENOUS

## 2024-05-10 MED ORDER — DEXTROSE 50 % IV SOLN
INTRAVENOUS | Status: AC | PRN
  Administered 2024-05-10: 1 via INTRAVENOUS

## 2024-05-12 ENCOUNTER — Telehealth: Payer: Self-pay | Admitting: Cardiology

## 2024-05-12 NOTE — Telephone Encounter (Signed)
 Wife Mateo) called to report to Dr. Monetta that patient passed away on 2024/06/10 27-May-2024).

## 2024-05-12 NOTE — Telephone Encounter (Signed)
 Dr. Monetta is aware of the pt's death.

## 2024-05-25 NOTE — ED Provider Notes (Signed)
 " [facility] AT [facility] Provider Note   CSN: [id number] Arrival date & time: 2024/05/15  1253     Patient presents with: Cardiac Arrest   James Chan is a 74 y.o. male with history of end-stage renal disease on dialysis presenting to ED by EMS after collapse at home.  EMS reports the patient had been feeling unwell yesterday.  Today he collapsed.  EMS reports that patient initially was in ventricular fibrillation on arrival.  CPR was performed for approximately 10 minutes with very brief return of spontaneous circulation, and subsequently at 1220, 10 minutes later the patient lost pulses.  CPR and coding had been ongoing for about 30 minutes prior to arrival.  In total the patient received multiple rounds of epinephrine , calcium  gluconate, sodium bicarb, lidocaine , after injury cc of fluid.  2 IO's were placed by EMS in bilateral tibias.  An oral airway was placed with i-gel.  Patient has been unresponsive since his collapse.  His wife reportedly was at home and coming to the hospital  I did review his outpatient records including cardiology evaluation from November.  He is noted to have very poor quality of life with significant heart failure, midodrine  for low blood pressure, coronary disease, ventricular tachycardia history, status post ICD placement   HPI     Prior to Admission medications  Medication Sig Start Date End Date Taking? Authorizing Provider  albuterol  (PROVENTIL ) (2.5 MG/3ML) 0.083% nebulizer solution Take 2.5 mg by nebulization every 6 (six) hours as needed. 01/15/24   [provider]  albuterol  (VENTOLIN  HFA) 108 (90 Base) MCG/ACT inhaler Inhale 2 puffs into the lungs every 6 (six) hours as needed. 03/06/24   Monetta Redell PARAS, MD  allopurinol  (ZYLOPRIM ) 100 MG tablet Take 100 mg by mouth daily at 6 PM. (1900)    [provider]  amiodarone  (PACERONE ) 200 MG tablet Take 1 tablet (200 mg total) by mouth daily. 01/22/23    Monetta Redell PARAS, MD  ascorbic acid  (VITAMIN C) 500 MG tablet Take 500 mg by mouth daily in the afternoon. 09/03/20   [provider]  aspirin  EC 81 MG tablet Take 81 mg by mouth 2 (two) times a week. Swallow whole.    [provider]  atorvastatin  (LIPITOR) 80 MG tablet Take 1 tablet (80 mg total) by mouth daily. 10/25/23   Henry Manuelita NOVAK, NP  cetirizine (ZYRTEC) 10 MG tablet Take 10 mg by mouth daily as needed for allergies. 04/24/23   [provider]  clopidogrel  (PLAVIX ) 75 MG tablet Please take two tablets (150 mg) of this medication  for 2 days then switch to (75 mg) daily 12/04/23   Monetta Redell PARAS, MD  latanoprost  (XALATAN ) 0.005 % ophthalmic solution Place 1 drop into both eyes at bedtime. 06/16/14   [provider]  lidocaine -prilocaine  (EMLA ) cream Apply 1 application topically See admin instructions. Apply small amount to access site 1 to 2 hours before dialysis then cover with saran wrap.  Three days a week 02/17/19   [provider]  mexiletine (MEXITIL ) 250 MG capsule Take 1 capsule (250 mg total) by mouth 2 (two) times daily. Patient not taking: Reported on 03/06/2024 10/08/23   Inocencio Soyla Lunger, MD  midodrine  (PROAMATINE ) 10 MG tablet Take 5-10 mg by mouth See admin instructions. Take 1 tablet by mouth with hemodialysis and 1/2 tablet all other days 05/29/19   [provider]  nitroGLYCERIN  (NITROSTAT ) 0.4 MG SL tablet Place 1 tablet (0.4  mg total) under the tongue every 5 (five) minutes as needed for chest pain. 02/16/22   Monetta Redell PARAS, MD  ondansetron  (ZOFRAN ) 4 MG tablet Take 1 tablet (4 mg total) by mouth daily as needed for nausea or vomiting. 10/24/23 10/23/24  Henry Manuelita NOVAK, NP  oxyCODONE -acetaminophen  (PERCOCET/ROXICET) 5-325 MG tablet Take 1 tablet by mouth every 4 (four) hours as needed for severe pain.  10/16/19   [provider]  pyridoxine  (B-6) 100 MG tablet Take 100 mg by mouth daily in the afternoon.  09/03/20   [provider]  torsemide  (DEMADEX ) 100 MG tablet Take 100 mg by mouth 4 (four) times a week. Take 1 tablet (100 mg) by mouth in the morning on Tuesdays, Thursdays, Saturdays & Sundays 10/20/20   [provider]  traZODone  (DESYREL ) 50 MG tablet Take 50 mg by mouth at bedtime. 06/29/21   [provider]  VELTASSA 8.4 g packet Take 8.4 g by mouth daily. 02/13/24   [provider]  vitamin E 200 UNIT capsule Take 200 Units by mouth daily in the afternoon.    [provider]    Allergies: Brilinta  [ticagrelor ]    Review of Systems  Updated Vital Signs There were no vitals taken for this visit.  Physical Exam Constitutional:      Comments: Unresponsive  HENT:     Head: Normocephalic and atraumatic.  Pulmonary:     Comments: No spontaneous respirations, bag mask ventilation Musculoskeletal:     Comments: Bilateral tibial IO's in place, fistula in right arm with no thrill or bruit  Skin:    Comments: Skin is cool and mottled  Neurological:     Comments: GCS of 3, pupils fixed and dilated, no gag reflex     (all labs ordered are listed, but only abnormal results are displayed) Labs Reviewed  CBG MONITORING, ED - Abnormal; Notable for the following components:      Result Value   Glucose-Capillary 183 (*)    All other components within normal limits    EKG: None  Radiology: No results found.   .Critical Care  Performed by: Cottie Donnice PARAS, MD Authorized by: Cottie Donnice PARAS, MD   Critical care provider statement:    Critical care time (minutes):  30   Critical care time was exclusive of:  Separately billable procedures and treating other patients   Critical care was necessary to treat or prevent imminent or life-threatening deterioration of the following conditions:  Circulatory failure   Critical care was time spent personally by me on the following activities:  Ordering and performing treatments and interventions,  ordering and review of laboratory studies, ordering and review of radiographic studies, pulse oximetry, review of old charts, examination of patient and evaluation of patient's response to treatment    Medications Ordered in the ED  EPINEPHrine  (ADRENALIN ) 1 MG/10ML injection (1 mg Intravenous Given 05/26/24 1259)  dextrose  50 % solution (1 ampule Intravenous Given 2024/05/26 1300)  atropine  1 MG/10ML injection (1 mg Intravenous Given 2024/05/26 1303)                                    Medical Decision Making Risk Prescription drug management.   Patient presents to the ED in cardiac arrest from home.  The differential is broad but can include arrhythmia including ventricular fibrillation arrest versus ACS versus other  Patient seen as code STEMI by cardiologist with myself  on arrival.  We both expressed concern about the prolonged resuscitation efforts with no return of spontaneous circulation, given his known advanced congestive heart failure.  Patient demonstrates evidence of possible brain death or anoxic brain injury on arrival with no gag reflex is present.  Code was briefly continued in the ED as the patient was given additional epinephrine .  Glucose was noted to be normal.  Bedside ultrasound did not show large pericardial effusion or tamponade but showed very weak and non-perfusing squeeze of the heart.  On telemetry patient appeared bradycardic but had no palpable pulses.  Atropine  was attempted at the bedside.  No significant change in status.  Both the cardiologist and myself felt that further resuscitation efforts were futile.  ultimately the patient was declared deceased - Time of Death  Family notified, wife and son in person, and his daughter by phone  This is not a case for medical examiner.  Suspected death of natural causes.  Called to bedside to pronounce SAVIEN MAMULA. On physical exam, patient had no spontaneous respirations and was unresponsive to auditory or tactile  stimuli. He had no cardiac sounds on auscultation and no carotid pulse. Pupils were fixed and dilated with no response to light.   Time of Death: 06/05/1302  Donnice JINNY Hughes      Final diagnoses:  Cardiac arrest [facility])  Ventricular fibrillation [facility])    ED Discharge Orders     None          Hughes Donnice JINNY, MD 27-May-2024 1501  "

## 2024-05-25 NOTE — ED Triage Notes (Signed)
 Pt presents to ED in active cardiac arrest. Pt unwitnessed arrest down time about 15 minutes PTA. Upon EMS arrival, pt in v fib. 2 shocks delivered. ROSC achieved once en route by EMS for brief period before rearresting at 1223. 2 tibial IO in place.   3 epi 2 lidocaine  1 sodium bicarb 1 calcium  gluconate 300 ccs NS

## 2024-05-25 DEATH — deceased

## 2024-05-26 ENCOUNTER — Ambulatory Visit: Payer: Medicare Other

## 2024-08-25 ENCOUNTER — Ambulatory Visit: Payer: Medicare Other

## 2024-11-24 ENCOUNTER — Ambulatory Visit: Payer: Medicare Other

## 2025-02-23 ENCOUNTER — Ambulatory Visit: Payer: Medicare Other
